# Patient Record
Sex: Male | Born: 1950
Health system: Southern US, Community
[De-identification: ages and names within clinical notes are randomized; demographics above are authoritative.]

## PROBLEM LIST (undated history)

## (undated) DIAGNOSIS — I712 Thoracic aortic aneurysm, without rupture, unspecified: Secondary | ICD-10-CM

## (undated) DIAGNOSIS — I739 Peripheral vascular disease, unspecified: Secondary | ICD-10-CM

## (undated) DIAGNOSIS — I639 Cerebral infarction, unspecified: Secondary | ICD-10-CM

## (undated) DIAGNOSIS — F329 Major depressive disorder, single episode, unspecified: Secondary | ICD-10-CM

## (undated) DIAGNOSIS — F4310 Post-traumatic stress disorder, unspecified: Secondary | ICD-10-CM

## (undated) DIAGNOSIS — I714 Abdominal aortic aneurysm, without rupture: Secondary | ICD-10-CM

## (undated) DIAGNOSIS — E039 Hypothyroidism, unspecified: Secondary | ICD-10-CM

## (undated) DIAGNOSIS — IMO0002 Reserved for concepts with insufficient information to code with codable children: Secondary | ICD-10-CM

## (undated) DIAGNOSIS — K219 Gastro-esophageal reflux disease without esophagitis: Secondary | ICD-10-CM

## (undated) DIAGNOSIS — E079 Disorder of thyroid, unspecified: Secondary | ICD-10-CM

## (undated) DIAGNOSIS — E785 Hyperlipidemia, unspecified: Secondary | ICD-10-CM

## (undated) DIAGNOSIS — J449 Chronic obstructive pulmonary disease, unspecified: Secondary | ICD-10-CM

## (undated) DIAGNOSIS — F32A Depression, unspecified: Secondary | ICD-10-CM

## (undated) DIAGNOSIS — E119 Type 2 diabetes mellitus without complications: Secondary | ICD-10-CM

## (undated) HISTORY — DX: Disorder of thyroid, unspecified: E07.9

## (undated) HISTORY — DX: Reserved for concepts with insufficient information to code with codable children: IMO0002

## (undated) HISTORY — PX: KNEE SURGERY: SHX244

## (undated) HISTORY — DX: Thoracic aortic aneurysm, without rupture, unspecified: I71.20

## (undated) HISTORY — DX: Peripheral vascular disease, unspecified: I73.9

## (undated) HISTORY — DX: Type 2 diabetes mellitus without complications: E11.9

## (undated) HISTORY — PX: CATARACT EXTRACTION: SUR2

## (undated) HISTORY — DX: Gastro-esophageal reflux disease without esophagitis: K21.9

## (undated) HISTORY — DX: Thoracic aortic aneurysm, without rupture: I71.2

## (undated) HISTORY — DX: Chronic obstructive pulmonary disease, unspecified: J44.9

## (undated) HISTORY — DX: Abdominal aortic aneurysm, without rupture: I71.4

## (undated) HISTORY — PX: CHOLECYSTECTOMY: SHX55

## (undated) HISTORY — PX: LUNG REMOVAL, PARTIAL: SHX233

---

## 2001-04-28 ENCOUNTER — Emergency Department (HOSPITAL_COMMUNITY): Admission: EM | Admit: 2001-04-28 | Discharge: 2001-04-29 | Payer: Self-pay | Admitting: Emergency Medicine

## 2001-04-29 ENCOUNTER — Encounter: Payer: Self-pay | Admitting: Emergency Medicine

## 2001-04-29 ENCOUNTER — Emergency Department (HOSPITAL_COMMUNITY): Admission: EM | Admit: 2001-04-29 | Discharge: 2001-04-29 | Payer: Self-pay | Admitting: Emergency Medicine

## 2003-02-05 ENCOUNTER — Encounter: Payer: Self-pay | Admitting: Emergency Medicine

## 2003-02-05 ENCOUNTER — Emergency Department (HOSPITAL_COMMUNITY): Admission: EM | Admit: 2003-02-05 | Discharge: 2003-02-05 | Payer: Self-pay | Admitting: Emergency Medicine

## 2003-02-21 ENCOUNTER — Emergency Department (HOSPITAL_COMMUNITY): Admission: EM | Admit: 2003-02-21 | Discharge: 2003-02-21 | Payer: Self-pay | Admitting: Emergency Medicine

## 2003-02-21 ENCOUNTER — Encounter: Payer: Self-pay | Admitting: Emergency Medicine

## 2013-05-18 ENCOUNTER — Encounter: Payer: Self-pay | Admitting: Family Medicine

## 2013-05-18 ENCOUNTER — Ambulatory Visit (INDEPENDENT_AMBULATORY_CARE_PROVIDER_SITE_OTHER): Payer: Medicare Other | Admitting: Family Medicine

## 2013-05-18 VITALS — BP 126/70 | HR 76 | Temp 97.3°F | Resp 20 | Wt 286.0 lb

## 2013-05-18 DIAGNOSIS — J441 Chronic obstructive pulmonary disease with (acute) exacerbation: Secondary | ICD-10-CM

## 2013-05-18 MED ORDER — PREDNISONE 20 MG PO TABS: ORAL_TABLET | ORAL | Status: DC

## 2013-05-18 MED ORDER — LEVOFLOXACIN 500 MG PO TABS: 500.0000 mg | ORAL_TABLET | Freq: Every day | ORAL | Status: DC

## 2013-05-18 NOTE — Progress Notes (Signed)
  Subjective:    Patient ID: Brad Taylor, male    DOB: 05-22-1951, 62 y.o.   MRN: [id number]  HPI This is a 62 year old male who gets the majority of his health care from the [place]. He has a past medical history of insulin-dependent diabetes mellitus and COPD. He continues to smoke. He presents with a one-week history of sinus pressure sinus pain, vertigo, worsening cough, wheezing, and progressive shortness of breath. He has a cough productive of sputum. Past Medical History  Diagnosis Date  . COPD (chronic obstructive pulmonary disease)   . Diabetes mellitus without complication   . Hypertension   . GERD (gastroesophageal reflux disease)   . Ulcer    No current outpatient prescriptions on file prior to visit.   No current facility-administered medications on file prior to visit.   Not on File History   Social History  . Marital Status: Married    Spouse Name: N/A    Number of Children: N/A  . Years of Education: N/A   Occupational History  . Not on file.   Social History Main Topics  . Smoking status: Former Smoker    Types: Cigarettes  . Smokeless tobacco: Former Neurosurgeon    Quit date: 05/04/2013  . Alcohol Use: No  . Drug Use: No  . Sexual Activity: Not on file   Other Topics Concern  . Not on file   Social History Narrative  . No narrative on file      Review of Systems  All other systems reviewed and are negative.       Objective:   Physical Exam  Vitals reviewed. HENT:  Right Ear: External ear normal.  Left Ear: External ear normal.  Nose: Nose normal.  Mouth/Throat: Oropharynx is clear and moist. No oropharyngeal exudate.  Neck: Neck supple.  Cardiovascular: Normal rate, regular rhythm and normal heart sounds.   No murmur heard. Pulmonary/Chest: Effort normal. No respiratory distress. He has wheezes. He has rhonchi.  Abdominal: Soft. Bowel sounds are normal.  Lymphadenopathy:    He has no cervical adenopathy.          Assessment & Plan:   1. COPD exacerbation Patient has an upper respiratory infection that is likely progressed to sinusitis and definitely has a COPD exacerbation. Begin Levaquin 500 mg by mouth daily for 7 days. Begin prednisone 20 mg tablets, 3 tablets on day 1 and day 2, 2 tablets on day 3 and 4, 1 tablet with a 5 and  6.  Use albuterol 2 puffs inhaled every 4 hours for wheezing and shortness of breath. Recheck next week or sooner if worse. I counseled patient about smoking cessation. I warned patient of prednisone likely exacerbate his blood sugar. He needs to increase his fluids and call me if his blood sugars rise above 300.  His last hemoglobin A1c was reportedly 7.5 at the [place] - predniSONE (DELTASONE) 20 MG tablet; 3 tabs poqday 1-2, 2 tabs poqday 3-4, 1 tab poqday 5-6  Dispense: 12 tablet; Refill: 0 - levofloxacin (LEVAQUIN) 500 MG tablet; Take 1 tablet (500 mg total) by mouth daily.  Dispense: 7 tablet; Refill: 0

## 2013-05-25 ENCOUNTER — Ambulatory Visit (INDEPENDENT_AMBULATORY_CARE_PROVIDER_SITE_OTHER): Payer: Medicare Other | Admitting: Family Medicine

## 2013-05-25 ENCOUNTER — Encounter: Payer: Self-pay | Admitting: Family Medicine

## 2013-05-25 VITALS — BP 130/84 | HR 64 | Temp 97.3°F | Resp 20 | Ht 70.75 in | Wt 280.0 lb

## 2013-05-25 DIAGNOSIS — J329 Chronic sinusitis, unspecified: Secondary | ICD-10-CM

## 2013-05-25 MED ORDER — AMOXICILLIN-POT CLAVULANATE 875-125 MG PO TABS: 1.0000 | ORAL_TABLET | Freq: Two times a day (BID) | ORAL | Status: DC

## 2013-05-25 NOTE — Progress Notes (Signed)
Subjective:    Patient ID: Brad Taylor, male    DOB: 04-26-1951, 62 y.o.   MRN: [id number]  HPI 05/18/13 This is a 62 year old male who gets the majority of his health care from the [place]. He has a past medical history of insulin-dependent diabetes mellitus and COPD. He continues to smoke. He presents with a one-week history of sinus pressure sinus pain, vertigo, worsening cough, wheezing, and progressive shortness of breath. He has a cough productive of sputum.  At that time, my plan was: 1. COPD exacerbation Patient has an upper respiratory infection that is likely progressed to sinusitis and definitely has a COPD exacerbation. Begin Levaquin 500 mg by mouth daily for 7 days. Begin prednisone 20 mg tablets, 3 tablets on day 1 and day 2, 2 tablets on day 3 and 4, 1 tablet with a 5 and  6.  Use albuterol 2 puffs inhaled every 4 hours for wheezing and shortness of breath. Recheck next week or sooner if worse. I counseled patient about smoking cessation. I warned patient of prednisone likely exacerbate his blood sugar. He needs to increase his fluids and call me if his blood sugars rise above 300.  His last hemoglobin A1c was reportedly 7.5 at the [place] - predniSONE (DELTASONE) 20 MG tablet; 3 tabs poqday 1-2, 2 tabs poqday 3-4, 1 tab poqday 5-6  Dispense: 12 tablet; Refill: 0 - levofloxacin (LEVAQUIN) 500 MG tablet; Take 1 tablet (500 mg total) by mouth daily.  Dispense: 7 tablet; Refill: 0  05/25/13 Patient's cough and breathing is much better.  He continues to smoke. However he also continues to have significant pain in his left frontal sinus and left maxillary sinus. He also reports disequilibrium and pain in both ears. He reports congestion, rhinorrhea, and postnasal drip. He has a dull constant headache. He also reports some photophobia which is unusual. He is also reported subjective fevers  Past Medical History  Diagnosis Date  . COPD (chronic obstructive pulmonary disease)   . Diabetes  mellitus without complication   . Hypertension   . GERD (gastroesophageal reflux disease)   . Ulcer    Current Outpatient Prescriptions on File Prior to Visit  Medication Sig Dispense Refill  . aspirin 81 MG tablet Take 81 mg by mouth daily.      . divalproex (DEPAKOTE) 500 MG DR tablet Take 500 mg by mouth 2 (two) times daily.      Marland Kitchen glipiZIDE (GLUCOTROL) 5 MG tablet Take 5 mg by mouth. 2 po qd      . insulin glargine (LANTUS) 100 UNIT/ML injection 60 units qam & 70 units qpm      . levofloxacin (LEVAQUIN) 500 MG tablet Take 1 tablet (500 mg total) by mouth daily.  7 tablet  0  . loratadine (CLARITIN) 10 MG tablet Take 10 mg by mouth daily.      . predniSONE (DELTASONE) 20 MG tablet 3 tabs poqday 1-2, 2 tabs poqday 3-4, 1 tab poqday 5-6  12 tablet  0  . RABEprazole (ACIPHEX) 20 MG tablet Take 20 mg by mouth daily.      Marland Kitchen terazosin (HYTRIN) 10 MG capsule Take 10 mg by mouth at bedtime.      . trazodone (DESYREL) 300 MG tablet Take 300 mg by mouth at bedtime.       No current facility-administered medications on file prior to visit.   Not on File History   Social History  . Marital Status: Married    Spouse Name:  N/A    Number of Children: N/A  . Years of Education: N/A   Occupational History  . Not on file.   Social History Main Topics  . Smoking status: Former Smoker    Types: Cigarettes  . Smokeless tobacco: Former Neurosurgeon    Quit date: 05/04/2013  . Alcohol Use: No  . Drug Use: No  . Sexual Activity: Not on file   Other Topics Concern  . Not on file   Social History Narrative  . No narrative on file      Review of Systems  All other systems reviewed and are negative.       Objective:   Physical Exam  Vitals reviewed. Constitutional: He is oriented to person, place, and time.  HENT:  Right Ear: External ear normal.  Left Ear: External ear normal.  Nose: Mucosal edema and rhinorrhea present. Right sinus exhibits no maxillary sinus tenderness and no frontal  sinus tenderness. Left sinus exhibits maxillary sinus tenderness and frontal sinus tenderness.  Mouth/Throat: Oropharynx is clear and moist. No oropharyngeal exudate.  Neck: Neck supple.  Cardiovascular: Normal rate, regular rhythm and normal heart sounds.   No murmur heard. Pulmonary/Chest: Effort normal. No respiratory distress. He has no wheezes. He has no rhonchi.  Abdominal: Soft. Bowel sounds are normal.  Lymphadenopathy:    He has no cervical adenopathy.  Neurological: He is alert and oriented to person, place, and time. He has normal reflexes. He displays normal reflexes. No cranial nerve deficit. He exhibits normal muscle tone. Coordination normal.          Assessment & Plan:  1. Rhinosinusitis Patient COPD exacerbation has improved dramatically. Unfortunately continues to have a lingering rhinosinusitis. Begin Augmentin 875 mg by mouth twice a day for 10 days. If symptoms are not better I would proceed with a CT scan of the sinuses. He is to call me immediately if symptoms change or worsen. I continue to recommend smoking cessation but the patient is now motivated to quit at the present time. - amoxicillin-clavulanate (AUGMENTIN) 875-125 MG per tablet; Take 1 tablet by mouth 2 (two) times daily.  Dispense: 20 tablet; Refill: 0

## 2013-06-04 ENCOUNTER — Telehealth: Payer: Self-pay | Admitting: Family Medicine

## 2013-06-04 NOTE — Telephone Encounter (Signed)
Please call in refill for Brad Taylor . Brad Taylor accidentally dropped his last bottle and he has enough for one last shot.   [facility]   [place]

## 2013-06-07 MED ORDER — INSULIN GLARGINE 100 UNIT/ML ~~LOC~~ SOLN: SUBCUTANEOUS | Status: DC

## 2013-06-07 NOTE — Telephone Encounter (Signed)
Medication refilled per protocol. 

## 2013-06-08 ENCOUNTER — Other Ambulatory Visit: Payer: Self-pay | Admitting: Family Medicine

## 2013-06-08 MED ORDER — INSULIN GLARGINE 100 UNIT/ML ~~LOC~~ SOLN: SUBCUTANEOUS | Status: DC

## 2013-06-08 NOTE — Telephone Encounter (Signed)
Pt called After hours on 11/1 out of insulin, medication refilled Will send in more vials as was recently here for OV

## 2013-09-15 ENCOUNTER — Inpatient Hospital Stay (HOSPITAL_COMMUNITY)
Admission: EM | Admit: 2013-09-15 | Discharge: 2013-09-20 | DRG: 065 | Disposition: A | Discharge status: Rehabilitation Facility | Payer: Medicare HMO | Attending: Family Medicine | Admitting: Family Medicine

## 2013-09-15 ENCOUNTER — Emergency Department (HOSPITAL_COMMUNITY): Payer: Medicare HMO

## 2013-09-15 ENCOUNTER — Encounter (HOSPITAL_COMMUNITY): Payer: Self-pay | Admitting: Radiology

## 2013-09-15 DIAGNOSIS — F4310 Post-traumatic stress disorder, unspecified: Secondary | ICD-10-CM | POA: Diagnosis present

## 2013-09-15 DIAGNOSIS — I4891 Unspecified atrial fibrillation: Secondary | ICD-10-CM | POA: Diagnosis present

## 2013-09-15 DIAGNOSIS — R29810 Facial weakness: Secondary | ICD-10-CM | POA: Diagnosis present

## 2013-09-15 DIAGNOSIS — Z9849 Cataract extraction status, unspecified eye: Secondary | ICD-10-CM | POA: Diagnosis present

## 2013-09-15 DIAGNOSIS — E1169 Type 2 diabetes mellitus with other specified complication: Secondary | ICD-10-CM | POA: Diagnosis present

## 2013-09-15 DIAGNOSIS — I440 Atrioventricular block, first degree: Secondary | ICD-10-CM | POA: Diagnosis present

## 2013-09-15 DIAGNOSIS — R471 Dysarthria and anarthria: Secondary | ICD-10-CM | POA: Diagnosis present

## 2013-09-15 DIAGNOSIS — F3289 Other specified depressive episodes: Secondary | ICD-10-CM | POA: Diagnosis present

## 2013-09-15 DIAGNOSIS — E039 Hypothyroidism, unspecified: Secondary | ICD-10-CM | POA: Diagnosis present

## 2013-09-15 DIAGNOSIS — Z794 Long term (current) use of insulin: Secondary | ICD-10-CM | POA: Diagnosis present

## 2013-09-15 DIAGNOSIS — J449 Chronic obstructive pulmonary disease, unspecified: Secondary | ICD-10-CM

## 2013-09-15 DIAGNOSIS — F17200 Nicotine dependence, unspecified, uncomplicated: Secondary | ICD-10-CM | POA: Diagnosis present

## 2013-09-15 DIAGNOSIS — E119 Type 2 diabetes mellitus without complications: Secondary | ICD-10-CM

## 2013-09-15 DIAGNOSIS — I10 Essential (primary) hypertension: Secondary | ICD-10-CM | POA: Diagnosis present

## 2013-09-15 DIAGNOSIS — R531 Weakness: Secondary | ICD-10-CM

## 2013-09-15 DIAGNOSIS — I6350 Cerebral infarction due to unspecified occlusion or stenosis of unspecified cerebral artery: Principal | ICD-10-CM | POA: Diagnosis present

## 2013-09-15 DIAGNOSIS — F329 Major depressive disorder, single episode, unspecified: Secondary | ICD-10-CM | POA: Diagnosis present

## 2013-09-15 DIAGNOSIS — E785 Hyperlipidemia, unspecified: Secondary | ICD-10-CM | POA: Diagnosis present

## 2013-09-15 DIAGNOSIS — Z79899 Other long term (current) drug therapy: Secondary | ICD-10-CM

## 2013-09-15 DIAGNOSIS — Z7982 Long term (current) use of aspirin: Secondary | ICD-10-CM

## 2013-09-15 DIAGNOSIS — I639 Cerebral infarction, unspecified: Secondary | ICD-10-CM | POA: Diagnosis present

## 2013-09-15 DIAGNOSIS — J4489 Other specified chronic obstructive pulmonary disease: Secondary | ICD-10-CM | POA: Diagnosis present

## 2013-09-15 DIAGNOSIS — K219 Gastro-esophageal reflux disease without esophagitis: Secondary | ICD-10-CM | POA: Diagnosis present

## 2013-09-15 DIAGNOSIS — G8190 Hemiplegia, unspecified affecting unspecified side: Secondary | ICD-10-CM | POA: Diagnosis present

## 2013-09-15 DIAGNOSIS — L97909 Non-pressure chronic ulcer of unspecified part of unspecified lower leg with unspecified severity: Secondary | ICD-10-CM | POA: Diagnosis present

## 2013-09-15 HISTORY — DX: Post-traumatic stress disorder, unspecified: F43.10

## 2013-09-15 HISTORY — DX: Hypothyroidism, unspecified: E03.9

## 2013-09-15 HISTORY — DX: Depression, unspecified: F32.A

## 2013-09-15 HISTORY — DX: Hyperlipidemia, unspecified: E78.5

## 2013-09-15 HISTORY — DX: Major depressive disorder, single episode, unspecified: F32.9

## 2013-09-15 LAB — DIFFERENTIAL
BASOS PCT: 0 % (ref 0–1)
Basophils Absolute: 0 10*3/uL (ref 0.0–0.1)
EOS ABS: 0.6 10*3/uL (ref 0.0–0.7)
Eosinophils Relative: 6 % — ABNORMAL HIGH (ref 0–5)
Lymphocytes Relative: 38 % (ref 12–46)
Lymphs Abs: 3.9 10*3/uL (ref 0.7–4.0)
MONOS PCT: 9 % (ref 3–12)
Monocytes Absolute: 1 10*3/uL (ref 0.1–1.0)
NEUTROS PCT: 47 % (ref 43–77)
Neutro Abs: 4.8 10*3/uL (ref 1.7–7.7)

## 2013-09-15 LAB — COMPREHENSIVE METABOLIC PANEL
ALBUMIN: 3.6 g/dL (ref 3.5–5.2)
ALT: 12 U/L (ref 0–53)
AST: 13 U/L (ref 0–37)
Alkaline Phosphatase: 64 U/L (ref 39–117)
BUN: 10 mg/dL (ref 6–23)
CO2: 30 mEq/L (ref 19–32)
Calcium: 9.2 mg/dL (ref 8.4–10.5)
Chloride: 95 mEq/L — ABNORMAL LOW (ref 96–112)
Creatinine, Ser: 0.83 mg/dL (ref 0.50–1.35)
GFR calc Af Amer: >90 mL/min (ref >90)
GFR calc non Af Amer: >90 mL/min (ref >90)
Glucose, Bld: 125 mg/dL — ABNORMAL HIGH (ref 70–99)
Potassium: 5 mEq/L (ref 3.7–5.3)
SODIUM: 137 meq/L (ref 137–147)
TOTAL PROTEIN: 7 g/dL (ref 6.0–8.3)
Total Bilirubin: 0.3 mg/dL (ref 0.3–1.2)

## 2013-09-15 LAB — POCT I-STAT, CHEM 8
BUN: 9 mg/dL (ref 6–23)
Calcium, Ion: 1.17 mmol/L (ref 1.13–1.30)
Chloride: 95 mEq/L — ABNORMAL LOW (ref 96–112)
Creatinine, Ser: 1 mg/dL (ref 0.50–1.35)
GLUCOSE: 124 mg/dL — AB (ref 70–99)
HCT: 50 % (ref 39.0–52.0)
Hemoglobin: 17 g/dL (ref 13.0–17.0)
Potassium: 4.6 mEq/L (ref 3.7–5.3)
Sodium: 135 mEq/L — ABNORMAL LOW (ref 137–147)
TCO2: 29 mmol/L (ref 0–100)

## 2013-09-15 LAB — ETHANOL

## 2013-09-15 LAB — APTT: aPTT: 26 seconds (ref 24–37)

## 2013-09-15 LAB — GLUCOSE, CAPILLARY
Glucose-Capillary: 132 mg/dL — ABNORMAL HIGH (ref 70–99)
Glucose-Capillary: 90 mg/dL (ref 70–99)

## 2013-09-15 LAB — CBC
HCT: 45.3 % (ref 39.0–52.0)
Hemoglobin: 15.9 g/dL (ref 13.0–17.0)
MCH: 31.7 pg (ref 26.0–34.0)
MCHC: 35.1 g/dL (ref 30.0–36.0)
MCV: 90.2 fL (ref 78.0–100.0)
PLATELETS: 175 10*3/uL (ref 150–400)
RBC: 5.02 MIL/uL (ref 4.22–5.81)
RDW: 12.9 % (ref 11.5–15.5)
WBC: 10.2 10*3/uL (ref 4.0–10.5)

## 2013-09-15 LAB — POCT I-STAT TROPONIN I: Troponin i, poc: 0 ng/mL (ref 0.00–0.08)

## 2013-09-15 LAB — TROPONIN I

## 2013-09-15 LAB — PROTIME-INR
INR: 1.03 (ref 0.00–1.49)
PROTHROMBIN TIME: 13.3 s (ref 11.6–15.2)

## 2013-09-15 MED ORDER — SODIUM CHLORIDE 0.9 % IJ SOLN
3.0000 mL | Freq: Two times a day (BID) | INTRAMUSCULAR | Status: DC
  Administered 2013-09-16 – 2013-09-17 (×5): 3 mL via INTRAVENOUS

## 2013-09-15 MED ORDER — PANTOPRAZOLE SODIUM 40 MG PO TBEC
40.0000 mg | DELAYED_RELEASE_TABLET | Freq: Every day | ORAL | Status: DC
  Administered 2013-09-16 – 2013-09-20 (×5): 40 mg via ORAL
  Filled 2013-09-15 (×5): qty 1

## 2013-09-15 MED ORDER — ASPIRIN 325 MG PO TABS
325.0000 mg | ORAL_TABLET | Freq: Every day | ORAL | Status: DC
  Administered 2013-09-16 (×2): 325 mg via ORAL
  Filled 2013-09-15 (×2): qty 1

## 2013-09-15 MED ORDER — LEVOTHYROXINE SODIUM 125 MCG PO TABS
125.0000 ug | ORAL_TABLET | Freq: Every day | ORAL | Status: DC
  Administered 2013-09-16 – 2013-09-20 (×5): 125 ug via ORAL
  Filled 2013-09-15 (×6): qty 1

## 2013-09-15 MED ORDER — ATORVASTATIN CALCIUM 40 MG PO TABS
40.0000 mg | ORAL_TABLET | Freq: Every day | ORAL | Status: DC
  Administered 2013-09-16: 40 mg via ORAL
  Filled 2013-09-15 (×2): qty 1

## 2013-09-15 MED ORDER — BUSPIRONE HCL 5 MG PO TABS
5.0000 mg | ORAL_TABLET | Freq: Two times a day (BID) | ORAL | Status: DC | PRN
  Filled 2013-09-15: qty 1

## 2013-09-15 MED ORDER — ENOXAPARIN SODIUM 40 MG/0.4ML ~~LOC~~ SOLN
40.0000 mg | SUBCUTANEOUS | Status: DC
  Administered 2013-09-16 – 2013-09-19 (×5): 40 mg via SUBCUTANEOUS
  Filled 2013-09-15 (×6): qty 0.4

## 2013-09-15 MED ORDER — SODIUM CHLORIDE 0.9 % IJ SOLN
3.0000 mL | INTRAMUSCULAR | Status: DC | PRN
Start: 2013-09-15 — End: 2013-09-20

## 2013-09-15 MED ORDER — VENLAFAXINE HCL ER 150 MG PO CP24
150.0000 mg | ORAL_CAPSULE | Freq: Two times a day (BID) | ORAL | Status: DC
  Administered 2013-09-16 – 2013-09-20 (×10): 150 mg via ORAL
  Filled 2013-09-15 (×11): qty 1

## 2013-09-15 MED ORDER — DIVALPROEX SODIUM 500 MG PO DR TAB
1000.0000 mg | DELAYED_RELEASE_TABLET | Freq: Every day | ORAL | Status: DC
  Administered 2013-09-16 – 2013-09-19 (×5): 1000 mg via ORAL
  Filled 2013-09-15 (×6): qty 2

## 2013-09-15 MED ORDER — STROKE: EARLY STAGES OF RECOVERY BOOK
Freq: Once | Status: AC
  Administered 2013-09-16
  Filled 2013-09-15: qty 1

## 2013-09-15 MED ORDER — SODIUM CHLORIDE 0.9 % IV SOLN: 250.0000 mL | INTRAVENOUS | Status: DC | PRN

## 2013-09-15 MED ORDER — INSULIN ASPART 100 UNIT/ML ~~LOC~~ SOLN
0.0000 [IU] | Freq: Three times a day (TID) | SUBCUTANEOUS | Status: DC
  Administered 2013-09-16 (×2): 2 [IU] via SUBCUTANEOUS
  Administered 2013-09-17: 3 [IU] via SUBCUTANEOUS
  Administered 2013-09-17: 5 [IU] via SUBCUTANEOUS
  Administered 2013-09-18 (×2): 3 [IU] via SUBCUTANEOUS
  Administered 2013-09-18: 5 [IU] via SUBCUTANEOUS
  Administered 2013-09-19: 3 [IU] via SUBCUTANEOUS
  Administered 2013-09-19 – 2013-09-20 (×3): 5 [IU] via SUBCUTANEOUS
  Administered 2013-09-20: 3 [IU] via SUBCUTANEOUS

## 2013-09-15 MED ORDER — TRAZODONE HCL 150 MG PO TABS
300.0000 mg | ORAL_TABLET | Freq: Every day | ORAL | Status: DC
  Administered 2013-09-16 – 2013-09-19 (×4): 300 mg via ORAL
  Filled 2013-09-15 (×6): qty 2

## 2013-09-15 MED ORDER — BUPROPION HCL ER (SR) 150 MG PO TB12
150.0000 mg | ORAL_TABLET | Freq: Every day | ORAL | Status: DC
  Administered 2013-09-16 – 2013-09-20 (×5): 150 mg via ORAL
  Filled 2013-09-15 (×5): qty 1

## 2013-09-15 NOTE — ED Notes (Signed)
Per EMS: Pt reports right sided weakness and slurred speech since 1530 this afternoon. Pt reports generalized "not feeling well" all day but denies N/V/pain. Pt AO x4. CBG 116. 144/81. 65 SR, noted to be irregular.

## 2013-09-15 NOTE — ED Notes (Signed)
Pt noted to be in a-fib. Dr. Lacinda Axon to order new EKG.

## 2013-09-15 NOTE — Consult Note (Signed)
Referring Physician: Rogene Houston    Chief Complaint: Right sided weakness, slurred speech  HPI: Brad Taylor is an 63 y.o. male who awakened this morning and was not feeling well.  His blood sugar was elevated at 180 and he decided to go back to sleep.  When he awakened later he noted that he was stumbling as he tried to get to the bathroom so after using the bathroom he went back to bed.  When he got out of bed later he was having some slurred speech and was complaining that his right arm did not feel right.  He though that he slept on it wrong.  When his son came home at 5:30 he noted that his face was drooping, speech was very slurred and he could not use his right arm well.  EMS was called at that time and the patient was brought in as a code stroke.    Date last known well: Date: 09/15/2013 Time last known well: Time: 14:00 tPA Given: No: Outside time window  Past Medical History  Diagnosis Date  . COPD (chronic obstructive pulmonary disease)   . Diabetes mellitus without complication   . Hypertension   . GERD (gastroesophageal reflux disease)   . Ulcer     History reviewed. No pertinent past surgical history.  Family history; Mother still alive.  Ha a history of colon cancer and is now cancer free.  Father died of COPD  Social History:  Patient smokes cigarettes.  Reports no history of alcohol or illicit drug abuse.    Allergies: Not on File  Medications: I have reviewed the patient's current medications. Prior to Admission:  No current facility-administered medications for this encounter. Current outpatient prescriptions: aspirin 81 MG tablet, Take 81 mg by mouth daily., Disp: , Rfl: ;   buPROPion (WELLBUTRIN SR) 150 MG 12 hr tablet, Take 150 mg by mouth daily., Disp: , Rfl: ;   busPIRone (BUSPAR) 10 MG tablet, Take 5 mg by mouth 2 (two) times daily as needed (for anxiety)., Disp: , Rfl: ;   divalproex (DEPAKOTE) 500 MG DR tablet, Take 1,000 mg by mouth at bedtime. , Disp: ,  Rfl:  etodolac (LODINE) 400 MG tablet, Take 400 mg by mouth 2 (two) times daily., Disp: , Rfl: ;   glipiZIDE (GLUCOTROL) 5 MG tablet, Take 10 mg by mouth daily before breakfast. , Disp: , Rfl: ;   insulin glargine (LANTUS) 100 UNIT/ML injection, Inject 60-65 Units into the skin 2 (two) times daily. 60 units in am, 65 units in pm, Disp: , Rfl:  levothyroxine (SYNTHROID, LEVOTHROID) 125 MCG tablet, Take 125 mcg by mouth daily before breakfast., Disp: , Rfl: ;   RABEprazole (ACIPHEX) 20 MG tablet, Take 40 mg by mouth daily before breakfast. , Disp: , Rfl: ;   rosuvastatin (CRESTOR) 20 MG tablet, Take 10 mg by mouth at bedtime., Disp: , Rfl: ;   Silver Nitrate SOLN, 1 application by Does not apply route every other day., Disp: , Rfl:  terazosin (HYTRIN) 10 MG capsule, Take 10 mg by mouth at bedtime., Disp: , Rfl: ;   traZODone (DESYREL) 100 MG tablet, Take 300 mg by mouth at bedtime. , Disp: , Rfl: ;   venlafaxine XR (EFFEXOR-XR) 150 MG 24 hr capsule, Take 150 mg by mouth 2 (two) times daily., Disp: , Rfl:   ROS: History obtained from the patient  General ROS: negative for - chills, fatigue, fever, night sweats, weight gain or weight loss Psychological ROS: negative  for - behavioral disorder, hallucinations, memory difficulties, mood swings or suicidal ideation Ophthalmic ROS: negative for - blurry vision, double vision, eye pain or loss of vision ENT ROS: negative for - epistaxis, nasal discharge, oral lesions, sore throat, tinnitus or vertigo Allergy and Immunology ROS: negative for - hives or itchy/watery eyes Hematological and Lymphatic ROS: negative for - bleeding problems, bruising or swollen lymph nodes Endocrine ROS: negative for - galactorrhea, hair pattern changes, polydipsia/polyuria or temperature intolerance Respiratory ROS: negative for - cough, hemoptysis, shortness of breath or wheezing Cardiovascular ROS: negative for - chest pain, dyspnea on exertion, edema or irregular  heartbeat Gastrointestinal ROS: negative for - abdominal pain, diarrhea, hematemesis, nausea/vomiting or stool incontinence Genito-Urinary ROS: negative for - dysuria, hematuria, incontinence or urinary frequency/urgency Musculoskeletal ROS: negative for - joint swelling or muscular weakness Neurological ROS: as noted in HPI Dermatological ROS: negative for rash and skin lesion changes  Physical Examination: Blood pressure 127/56, pulse 54, resp. rate 19, SpO2 95.00%.  Neurologic Examination: Mental Status: Alert, oriented, thought content appropriate.  Speech nonfluent.  At times words can not be distinguished.  Speech is slurred.  Able to follow 3 step commands without difficulty. Cranial Nerves: II: Discs flat bilaterally; Visual fields grossly normal, pupils equal, round, reactive to light and accommodation III,IV, VI: ptosis not present, extra-ocular motions intact bilaterally V,VII: mild right facial droop, facial light touch sensation normal bilaterally VIII: hearing normal bilaterally IX,X: gag reflex present XI: bilateral shoulder shrug XII: midline tongue extension Motor: Right : Upper extremity   3/5    Left:     Upper extremity   5/5  Lower extremity   5/5     Lower extremity   5/5 Tone and bulk:normal tone throughout; no atrophy noted Sensory: Pinprick and light touch decreased on the RUE Deep Tendon Reflexes: 2+ and symmetric with absent AJ's bilaterally Plantars: Right: downgoing   Left: downgoing Cerebellar: normal finger-to-nose and normal heel-to-shin test.  Difficult to perform on the right secondary to weakness Gait: Unable to test safely CV: pulses palpable throughout   Laboratory Studies:  Basic Metabolic Panel:  Recent Labs Lab 09/15/13 1833 09/15/13 1845  NA 137 135*  K 5.0 4.6  CL 95* 95*  CO2 30  --   GLUCOSE 125* 124*  BUN 10 9  CREATININE 0.83 1.00  CALCIUM 9.2  --     Liver Function Tests:  Recent Labs Lab 09/15/13 1833  AST 13   ALT 12  ALKPHOS 64  BILITOT 0.3  PROT 7.0  ALBUMIN 3.6   No results found for this basename: LIPASE, AMYLASE,  in the last 168 hours No results found for this basename: AMMONIA,  in the last 168 hours  CBC:  Recent Labs Lab 09/15/13 1833 09/15/13 1845  WBC 10.2  --   NEUTROABS 4.8  --   HGB 15.9 17.0  HCT 45.3 50.0  MCV 90.2  --   PLT 175  --     Cardiac Enzymes:  Recent Labs Lab 09/15/13 1833  TROPONINI <0.30    BNP: No components found with this basename: POCBNP,   CBG:  Recent Labs Lab 09/15/13 1856  GLUCAP 132*    Microbiology: No results found for this or any previous visit.  Coagulation Studies:  Recent Labs  09/15/13 1833  LABPROT 13.3  INR 1.03    Urinalysis: No results found for this basename: COLORURINE, APPERANCEUR, LABSPEC, PHURINE, GLUCOSEU, HGBUR, BILIRUBINUR, KETONESUR, PROTEINUR, UROBILINOGEN, NITRITE, LEUKOCYTESUR,  in the last 168 hours  Lipid Panel: No results found for this basename: chol, trig, hdl, cholhdl, vldl, ldlcalc    HgbA1C:  No results found for this basename: HGBA1C    Urine Drug Screen:   No results found for this basename: labopia, cocainscrnur, labbenz, amphetmu, thcu, labbarb    Alcohol Level:  Recent Labs Lab 09/15/13 [place] <11    Other results: EKG: sinus rhythm at 55 bpm.  Imaging: Ct Head Wo Contrast  09/15/2013   CLINICAL DATA:  Right-sided weakness and slurred speech  EXAM: CT HEAD WITHOUT CONTRAST  TECHNIQUE: Contiguous axial images were obtained from the base of the skull through the vertex without intravenous contrast.  COMPARISON:  None.  FINDINGS: There is chronic diffuse atrophy. Chronic bilateral periventricular white matter small vessel ischemic changes noted. There is no midline shift, hydrocephalus, or mass. No acute hemorrhage or acute transcortical infarct is identified. The bony calvarium is intact. There is mucoperiosteal thickening of bilateral ethmoid and bilateral maxillary  sinuses. Air-fluid level is identified in the left maxillary sinus consistent with acute sinusitis.  IMPRESSION: No focal acute intracranial abnormality identified. Chronic diffuse atrophy. Chronic bilateral periventricular white matter small vessel ischemic change.  I attempted to call Dr. Alexis Goodell with Critical Value/emergent results at the time of interpretation on 09/15/2013 at 6:51 PM but could not reach her.   Electronically Signed   By: Abelardo Diesel M.D.   On: 09/15/2013 18:57    Assessment: 63 y.o. male presenting with difficulty with speech, right sided weakness and numbness.  Patient is outside of the window for tPA.  Has multiple stroke risk factors.  CT reviewed and shows no acute changes.  Further work up recommended.    Stroke Risk Factors - diabetes mellitus, hypertension and smoking  Plan: 1. HgbA1c, fasting lipid panel 2. MRI, MRA  of the brain without contrast 3. PT consult, OT consult, Speech consult 4. Echocardiogram 5. Carotid dopplers 6. Prophylactic therapy-Antiplatelet med: Aspirin - dose 325mg  daily 7. Risk factor modification 8. Telemetry monitoring 9. Frequent neuro checks  Case discussed with Dr. Forbes Cellar, MD [facility] [phone] 09/15/2013, 7:33 PM

## 2013-09-15 NOTE — ED Notes (Signed)
Dr Cook at bedside

## 2013-09-15 NOTE — H&P (Signed)
[facility] Admission History and Physical Service Pager: [phone]  Patient name: Brad Taylor Medical record number: [id number] Date of birth: 11/19/1950 Age: 63 y.o. Gender: male  Primary Care Provider: Odette Fraction, MD Consultants: Neurology Code Status: Full code  Chief Complaint:   Assessment and Plan: Brad Taylor is a 63 y.o. male presenting with right facial droop, slurred speech, and right upper extremity weakness concerning for CVA. PMH is significant for COPD, DM-2, Hypothyroidism, GERD, Depression, PTSD, and HLD.   Acute Stroke - CT head negative. - Admit to telemetry - Stroke work up: MRI/MRA, echo, carotid dopplers - Risk stratification labs: Hb A1C, Lipid panel - Frequent neuro checks - Antiplatelet therapy - ASA 325 per neurology; Consider plavix as patient was on aspirin as outpatient - PT/OT/SLP consults; patient passed bedside swallow in ED.  DM-2  - Hb A1C ordered - CBG's TIDAC and QHS - SSI Moderate - Will assess Lantus dose after nighttime CBG.  CBG's have been well controlled here (120's-130's)  Irregular HR - In the ED, patient appeared to be in atrial fibrillation on the cardiac monitor - EKG obtained and revealed sinus arrythmia with 1st degree AV block (per my reading). - Will repeat EKG in the am  Elevated BP - Patient does not carry a diagnosis of hypertension - Allowing permissive HTN in the setting of stroke - Will monitor closley  Hypothyroidism - Continuing home Synthroid  GERD - Continuing PPI (using formulary protonix)  Psych: Depression/PTSD - Continuing home medications as patient has been on them for years: Trazodone, Divalproex, BuSpar, Bupropion, Venlafaxine.  HLD - Continuing statin (formulary Lipitor)  COPD - Patient is only on albuterol at home which his wife states she uses very infrequently - No evidence of acute exacerbation currently - Monitor closely during  admission  FEN/GI: SL IV, Carb modified diet Prophylaxis: Lovenox  Disposition: Pending further workup and PT/OT evaluations/recommendations   History of Present Illness:  Brad Taylor is a 63 y.o. male with PMH of COPD, DM-2, Hypothyroidism, GERD, Depression, PTSD, and HLD who presents with right facial droop and right-sided weakness.  History obtained from patient and his wife.  Brad Taylor reports he has not felt well today.  He is been more tired/fatigued today and has been sleeping the majority of the day.  His wife reports that  she left for a period of time today and when she returned home this afternoon, Brad Taylor reported having difficulty getting his clothes on.  At approximately 5 PM, his wife noted that he had difficulty with speech and some right-sided weakness.  She also noted right facial droop.  EMS was then called and patient was taken immediately to the emergency room for evaluation.  In the emergency department, code stroke was called.  CT scan was obtained and was negative.  Neurology was consulted and patient was deemed to be outside of tPA window (last known well @ 14:00). Family medicine then called for admission.  Review Of Systems: Per HPI with the following additions:   He denies chest pain, shortness of breath, nausea, vomiting, diarrhea, fevers, chills.  He reports that prior to today he has been feeling well. Otherwise 12 point review of systems was performed and was unremarkable.  Patient Active Problem List   Diagnosis Date Noted  . Stroke 09/15/2013   Past Medical History: Past Medical History  Diagnosis Date  . COPD (chronic obstructive pulmonary disease)   . Diabetes mellitus without complication   .  Hypothyroidism   . GERD (gastroesophageal reflux disease)   . Ulcer   . Post traumatic stress disorder   . Depression   . Hyperlipidemia    Past Surgical History: Past Surgical History  Procedure Laterality Date  . Cholecystectomy    . Cataract  extraction    . Knee surgery      Social History: History  Substance Use Topics  . Smoking status: Current Every Day Smoker -- 1.00 packs/day    Types: Cigarettes  . Smokeless tobacco: Former Systems developer    Quit date: 05/04/2013  . Alcohol Use: No     Comment: Former heavy drinker, been sober for 10 years    Family History: History reviewed. No pertinent family history. Allergies and Medications: Not on File No current facility-administered medications on file prior to encounter.   Current Outpatient Prescriptions on File Prior to Encounter  Medication Sig Dispense Refill  . aspirin 81 MG tablet Take 81 mg by mouth daily.      . divalproex (DEPAKOTE) 500 MG DR tablet Take 1,000 mg by mouth at bedtime.       Marland Kitchen glipiZIDE (GLUCOTROL) 5 MG tablet Take 10 mg by mouth daily before breakfast.       . RABEprazole (ACIPHEX) 20 MG tablet Take 40 mg by mouth daily before breakfast.       . terazosin (HYTRIN) 10 MG capsule Take 10 mg by mouth at bedtime.        Objective: BP 151/84  Pulse 56  Temp(Src) 97.8 F (36.6 C)  Resp 14  SpO2 97% Exam: General: obese male, resting in bed, NAD.  HEENT: MMM. PERRLA.  Cardiovascular: Irregular.  2/6 systolic murmur noted.   Respiratory: Scattered mild wheezing noted. Abdomen: obese, protuberant, soft, nontender, nondistended. Surgical scar noted from prior cholecystectomy. Extremities: Trace LE edema with hyperpigmentation consistent with chronic venous insufficiency.   Skin: Right ankle - small healing ulceration (approximately 0.5 cm x 0.5 cm).  Left ankle - 1 cm x 1 cm ulceration noted. Neuro: AO x 3.  Slurred speech. PEERLA. CN 2-12 intact. Very mild right facial droop. Muscle strength - normal except 4/5 in RUE. Patient had difficulty with finger to nose on the right.  Labs and Imaging: CBC BMET   Recent Labs Lab 09/15/13 1833 09/15/13 1845  WBC 10.2  --   HGB 15.9 17.0  HCT 45.3 50.0  PLT 175  --     Recent Labs Lab 09/15/13 1833  09/15/13 1845  NA 137 135*  K 5.0 4.6  CL 95* 95*  CO2 30  --   BUN 10 9  CREATININE 0.83 1.00  GLUCOSE 125* 124*  CALCIUM 9.2  --      Ct Head Wo Contrast 09/15/2013   IMPRESSION: No focal acute intracranial abnormality identified. Chronic diffuse atrophy. Chronic bilateral periventricular white matter small vessel ischemic change.  Dg Chest Port 1 View 09/15/2013   IMPRESSION: Negative except for mild cardiac enlargement   Coral Spikes, DO 09/15/2013, 9:05 PM PGY-2, [facility] pager: [phone], text pages welcome

## 2013-09-15 NOTE — ED Provider Notes (Signed)
CSN: [id number]     Arrival date & time 09/15/13  1827 History   First MD Initiated Contact with Patient 09/15/13 1832     Chief Complaint  Patient presents with  . Code Stroke    @EDPCLEARED @ (Consider location/radiation/quality/duration/timing/severity/associated sxs/prior Treatment) The history is provided by the patient and the EMS personnel.   63 year old male who history of diabetes and hypertension as risk factors for stroke. Patient also has COPD. Patient with acute onset at around 5:30 of right-sided weakness speech being off and a droop to the right side of the face. Code stroke was called in the field.  Past Medical History  Diagnosis Date  . COPD (chronic obstructive pulmonary disease)   . Diabetes mellitus without complication   . Hypertension   . GERD (gastroesophageal reflux disease)   . Ulcer    History reviewed. No pertinent past surgical history. History reviewed. No pertinent family history. History  Substance Use Topics  . Smoking status: Former Smoker    Types: Cigarettes  . Smokeless tobacco: Former Systems developer    Quit date: 05/04/2013  . Alcohol Use: No    Review of Systems  Constitutional: Positive for fatigue.  HENT: Negative for congestion.   Respiratory: Negative for shortness of breath.   Cardiovascular: Negative for chest pain.  Gastrointestinal: Negative for abdominal pain.  Genitourinary: Negative for dysuria.  Musculoskeletal: Negative for back pain.  Skin: Negative for rash.  Neurological: Positive for facial asymmetry, speech difficulty and weakness.  Hematological: Does not bruise/bleed easily.  Psychiatric/Behavioral: Negative for confusion.      Allergies  Review of patient's allergies indicates not on file.  Home Medications   Current Outpatient Rx  Name  Route  Sig  Dispense  Refill  . amoxicillin-clavulanate (AUGMENTIN) 875-125 MG per tablet   Oral   Take 1 tablet by mouth 2 (two) times daily.   20 tablet   0   . aspirin 81  MG tablet   Oral   Take 81 mg by mouth daily.         . divalproex (DEPAKOTE) 500 MG DR tablet   Oral   Take 500 mg by mouth 2 (two) times daily.         Marland Kitchen glipiZIDE (GLUCOTROL) 5 MG tablet   Oral   Take 5 mg by mouth. 2 po qd         . insulin glargine (LANTUS) 100 UNIT/ML injection      60 units qam & 70 units qpm   50 mL   2     More vials needed   . levofloxacin (LEVAQUIN) 500 MG tablet   Oral   Take 1 tablet (500 mg total) by mouth daily.   7 tablet   0   . loratadine (CLARITIN) 10 MG tablet   Oral   Take 10 mg by mouth daily.         . predniSONE (DELTASONE) 20 MG tablet      3 tabs poqday 1-2, 2 tabs poqday 3-4, 1 tab poqday 5-6   12 tablet   0   . RABEprazole (ACIPHEX) 20 MG tablet   Oral   Take 20 mg by mouth daily.         Marland Kitchen terazosin (HYTRIN) 10 MG capsule   Oral   Take 10 mg by mouth at bedtime.         . trazodone (DESYREL) 300 MG tablet   Oral   Take 300 mg by mouth at bedtime.  BP 147/78  Pulse 57  Resp 19  SpO2 95% Physical Exam  Nursing note and vitals reviewed. Constitutional: He is oriented to person, place, and time. He appears well-developed and well-nourished. He appears distressed.  HENT:  Head: Normocephalic and atraumatic.  Mouth/Throat: Oropharynx is clear and moist.  Slight right facial droop.  Eyes: Conjunctivae and EOM are normal. Pupils are equal, round, and reactive to light.  Neck: Normal range of motion. Neck supple.  Cardiovascular: Normal rate, regular rhythm and normal heart sounds.   No murmur heard. Pulmonary/Chest: Effort normal and breath sounds normal. No respiratory distress.  Bilateral rhonchi  Abdominal: Soft. Bowel sounds are normal.  Musculoskeletal: Normal range of motion.  Neurological: He is alert and oriented to person, place, and time.  Right-sided facial droop very mild. Finger to nose on right side with lots of difficulty heel to knee on right side with lots of difficulty.  Mild weakness to right upper extremity and right lower extremity.  Skin: Skin is warm. No rash noted.    ED Course  Procedures (including critical care time) Labs Review Labs Reviewed  DIFFERENTIAL - Abnormal; Notable for the following:    Eosinophils Relative 6 (*)    All other components within normal limits  GLUCOSE, CAPILLARY - Abnormal; Notable for the following:    Glucose-Capillary 132 (*)    All other components within normal limits  POCT I-STAT, CHEM 8 - Abnormal; Notable for the following:    Sodium 135 (*)    Chloride 95 (*)    Glucose, Bld 124 (*)    All other components within normal limits  CBC  ETHANOL  PROTIME-INR  APTT  COMPREHENSIVE METABOLIC PANEL  TROPONIN I  URINE RAPID DRUG SCREEN (HOSP PERFORMED)  URINALYSIS, ROUTINE W REFLEX MICROSCOPIC  POCT I-STAT TROPONIN I   Results for orders placed during the hospital encounter of 09/15/13  ETHANOL      Result Value Ref Range   Alcohol, Ethyl (B) <11  0 - 11 mg/dL  PROTIME-INR      Result Value Ref Range   Prothrombin Time 13.3  11.6 - 15.2 seconds   INR 1.03  0.00 - 1.49  APTT      Result Value Ref Range   aPTT 26  24 - 37 seconds  CBC      Result Value Ref Range   WBC 10.2  4.0 - 10.5 K/uL   RBC 5.02  4.22 - 5.81 MIL/uL   Hemoglobin 15.9  13.0 - 17.0 g/dL   HCT 45.3  39.0 - 52.0 %   MCV 90.2  78.0 - 100.0 fL   MCH 31.7  26.0 - 34.0 pg   MCHC 35.1  30.0 - 36.0 g/dL   RDW 12.9  11.5 - 15.5 %   Platelets 175  150 - 400 K/uL  DIFFERENTIAL      Result Value Ref Range   Neutrophils Relative % 47  43 - 77 %   Neutro Abs 4.8  1.7 - 7.7 K/uL   Lymphocytes Relative 38  12 - 46 %   Lymphs Abs 3.9  0.7 - 4.0 K/uL   Monocytes Relative 9  3 - 12 %   Monocytes Absolute 1.0  0.1 - 1.0 K/uL   Eosinophils Relative 6 (*) 0 - 5 %   Eosinophils Absolute 0.6  0.0 - 0.7 K/uL   Basophils Relative 0  0 - 1 %   Basophils Absolute 0.0  0.0 - 0.1 K/uL  COMPREHENSIVE  METABOLIC PANEL      Result Value Ref Range    Sodium 137  137 - 147 mEq/L   Potassium 5.0  3.7 - 5.3 mEq/L   Chloride 95 (*) 96 - 112 mEq/L   CO2 30  19 - 32 mEq/L   Glucose, Bld 125 (*) 70 - 99 mg/dL   BUN 10  6 - 23 mg/dL   Creatinine, Ser 0.83  0.50 - 1.35 mg/dL   Calcium 9.2  8.4 - 10.5 mg/dL   Total Protein 7.0  6.0 - 8.3 g/dL   Albumin 3.6  3.5 - 5.2 g/dL   AST 13  0 - 37 U/L   ALT 12  0 - 53 U/L   Alkaline Phosphatase 64  39 - 117 U/L   Total Bilirubin 0.3  0.3 - 1.2 mg/dL   GFR calc non Af Amer >90  >90 mL/min   GFR calc Af Amer >90  >90 mL/min  TROPONIN I      Result Value Ref Range   Troponin I <0.30  <0.30 ng/mL  GLUCOSE, CAPILLARY      Result Value Ref Range   Glucose-Capillary 132 (*) 70 - 99 mg/dL   Comment 1 Notify RN     Comment 2 Documented in Chart    POCT I-STAT, CHEM 8      Result Value Ref Range   Sodium 135 (*) 137 - 147 mEq/L   Potassium 4.6  3.7 - 5.3 mEq/L   Chloride 95 (*) 96 - 112 mEq/L   BUN 9  6 - 23 mg/dL   Creatinine, Ser 1.00  0.50 - 1.35 mg/dL   Glucose, Bld 124 (*) 70 - 99 mg/dL   Calcium, Ion 1.17  1.13 - 1.30 mmol/L   TCO2 29  0 - 100 mmol/L   Hemoglobin 17.0  13.0 - 17.0 g/dL   HCT 50.0  39.0 - 52.0 %  POCT I-STAT TROPONIN I      Result Value Ref Range   Troponin i, poc 0.00  0.00 - 0.08 ng/mL   Comment 3             Imaging Review Ct Head Wo Contrast  09/15/2013   CLINICAL DATA:  Right-sided weakness and slurred speech  EXAM: CT HEAD WITHOUT CONTRAST  TECHNIQUE: Contiguous axial images were obtained from the base of the skull through the vertex without intravenous contrast.  COMPARISON:  None.  FINDINGS: There is chronic diffuse atrophy. Chronic bilateral periventricular white matter small vessel ischemic changes noted. There is no midline shift, hydrocephalus, or mass. No acute hemorrhage or acute transcortical infarct is identified. The bony calvarium is intact. There is mucoperiosteal thickening of bilateral ethmoid and bilateral maxillary sinuses. Air-fluid level is identified  in the left maxillary sinus consistent with acute sinusitis.  IMPRESSION: No focal acute intracranial abnormality identified. Chronic diffuse atrophy. Chronic bilateral periventricular white matter small vessel ischemic change.  I attempted to call Dr. Alexis Goodell with Critical Value/emergent results at the time of interpretation on 09/15/2013 at 6:51 PM but could not reach her.   Electronically Signed   By: Abelardo Diesel M.D.   On: 09/15/2013 18:57    EKG Interpretation    Date/Time:  Wednesday September 15 2013 18:52:38 EST Ventricular Rate:  55 PR Interval:  203 QRS Duration: 68 QT Interval:  407 QTC Calculation: 389 R Axis:   7 Text Interpretation:  Sinus or ectopic atrial rhythm Borderline low voltage, extremity leads RSR' in V1 or  V2, probably normal variant Minimal ST elevation, anterior leads No previous ECGs available Confirmed by Flonnie Wierman  MD, Sitara Cashwell ([id number]) on 09/15/2013 7:11:02 PM           CRITICAL CARE Performed by: Mervin Kung. Total critical care time: 30 Critical care time was exclusive of separately billable procedures and treating other patients. Critical care was necessary to treat or prevent imminent or life-threatening deterioration. Critical care was time spent personally by me on the following activities: development of treatment plan with patient and/or surrogate as well as nursing, discussions with consultants, evaluation of patient's response to treatment, examination of patient, obtaining history from patient or surrogate, ordering and performing treatments and interventions, ordering and review of laboratory studies, ordering and review of radiographic studies, pulse oximetry and re-evaluation of patient's condition.  MDM   Final diagnoses:  CVA (cerebral infarction)   Patient most likely with the CVA. An NIH stroke scale of 4. Not a candidate for TPA. Patient will require admission. Consult by neurology. Medicine admission with them following.  Patient has some right-sided weakness and a slight right-sided facial droop. Patient is mentating fine.     Mervin Kung, MD 09/15/13 [phone]

## 2013-09-15 NOTE — ED Notes (Signed)
Dr. Zackowski at bedside  

## 2013-09-15 NOTE — Progress Notes (Signed)
Code Stroke called at 1810 Patient arrived at 26.  Labs drawn, head CT done. NIHSS 4  Right facial droop, Right arm weakness, Right sensory deficit, slurred speech. Per patient and family he has not felt well all day.  He had been fatigues and generally ill.  LKW this afternoon about 1400.  Right sided weakness noticed at 1730.  Dr Doy Mince at bedside to assess patient

## 2013-09-15 NOTE — ED Notes (Signed)
Portable chest xray at bedside.

## 2013-09-15 NOTE — ED Notes (Signed)
Patient reports he does not have to urinate at this time. Informed patient to let staff know when he needs to use the restroom so we can obtain a sample. Pt agreed. Will re-assess shortly.

## 2013-09-16 ENCOUNTER — Inpatient Hospital Stay (HOSPITAL_COMMUNITY): Payer: Medicare HMO

## 2013-09-16 DIAGNOSIS — R471 Dysarthria and anarthria: Secondary | ICD-10-CM

## 2013-09-16 DIAGNOSIS — J449 Chronic obstructive pulmonary disease, unspecified: Secondary | ICD-10-CM

## 2013-09-16 DIAGNOSIS — E785 Hyperlipidemia, unspecified: Secondary | ICD-10-CM

## 2013-09-16 DIAGNOSIS — Z794 Long term (current) use of insulin: Secondary | ICD-10-CM

## 2013-09-16 DIAGNOSIS — M6281 Muscle weakness (generalized): Secondary | ICD-10-CM

## 2013-09-16 DIAGNOSIS — E119 Type 2 diabetes mellitus without complications: Secondary | ICD-10-CM

## 2013-09-16 DIAGNOSIS — R29810 Facial weakness: Secondary | ICD-10-CM

## 2013-09-16 DIAGNOSIS — E039 Hypothyroidism, unspecified: Secondary | ICD-10-CM

## 2013-09-16 LAB — RAPID URINE DRUG SCREEN, HOSP PERFORMED
Amphetamines: NOT DETECTED
BENZODIAZEPINES: NOT DETECTED
Barbiturates: NOT DETECTED
COCAINE: NOT DETECTED
Opiates: NOT DETECTED
Tetrahydrocannabinol: NOT DETECTED

## 2013-09-16 LAB — URINALYSIS, ROUTINE W REFLEX MICROSCOPIC
GLUCOSE, UA: NEGATIVE mg/dL
Hgb urine dipstick: NEGATIVE
KETONES UR: NEGATIVE mg/dL
Leukocytes, UA: NEGATIVE
Nitrite: NEGATIVE
PROTEIN: NEGATIVE mg/dL
Specific Gravity, Urine: 1.021 (ref 1.005–1.030)
Urobilinogen, UA: 1 mg/dL (ref 0.0–1.0)
pH: 7.5 (ref 5.0–8.0)

## 2013-09-16 LAB — HEMOGLOBIN A1C
Hgb A1c MFr Bld: 7.4 % — ABNORMAL HIGH (ref <5.7)
MEAN PLASMA GLUCOSE: 166 mg/dL — AB (ref <117)

## 2013-09-16 LAB — LIPID PANEL
CHOL/HDL RATIO: 6.2 ratio
Cholesterol: 216 mg/dL — ABNORMAL HIGH (ref 0–200)
HDL: 35 mg/dL — ABNORMAL LOW (ref >39)
LDL Cholesterol: 123 mg/dL — ABNORMAL HIGH (ref 0–99)
Triglycerides: 288 mg/dL — ABNORMAL HIGH (ref <150)
VLDL: 58 mg/dL — AB (ref 0–40)

## 2013-09-16 LAB — GLUCOSE, CAPILLARY
GLUCOSE-CAPILLARY: 118 mg/dL — AB (ref 70–99)
Glucose-Capillary: 143 mg/dL — ABNORMAL HIGH (ref 70–99)
Glucose-Capillary: 142 mg/dL — ABNORMAL HIGH (ref 70–99)

## 2013-09-16 LAB — TSH: TSH: 4.045 u[IU]/mL (ref 0.350–4.500)

## 2013-09-16 MED ORDER — ASPIRIN EC 81 MG PO TBEC
81.0000 mg | DELAYED_RELEASE_TABLET | Freq: Every day | ORAL | Status: DC
Start: 2013-09-16 — End: 2013-09-20
  Administered 2013-09-16 – 2013-09-20 (×5): 81 mg via ORAL
  Filled 2013-09-16 (×5): qty 1

## 2013-09-16 MED ORDER — CLOPIDOGREL BISULFATE 75 MG PO TABS
75.0000 mg | ORAL_TABLET | Freq: Every day | ORAL | Status: DC
  Administered 2013-09-16 – 2013-09-20 (×5): 75 mg via ORAL
  Filled 2013-09-16 (×5): qty 1

## 2013-09-16 MED ORDER — ATORVASTATIN CALCIUM 80 MG PO TABS
80.0000 mg | ORAL_TABLET | Freq: Every day | ORAL | Status: DC
  Administered 2013-09-16 – 2013-09-20 (×5): 80 mg via ORAL
  Filled 2013-09-16 (×5): qty 1

## 2013-09-16 NOTE — Discharge Summary (Signed)
[facility] Discharge Summary  Patient name: Brad Taylor Medical record number: [id number] Date of birth: 1951/07/24 Age: 63 y.o. Gender: male Date of Admission: 09/15/2013  Date of Discharge: 09/20/13 Admitting Physician: Lupita Dawn, MD  Primary Care Provider: Odette Fraction, MD Consultants: Neurology, CIR  Indication for Hospitalization: new RUE weakness, slurred speech, facial droop, concern for acute stroke  Discharge Diagnoses/Problem List:  Acute ischemic stroke (L-posterior limb internal capsule), residual RUE weakness and dysarthria - Improved Possible Paroxysmal Atrial Fibrillation, unconfirmed Type 2 DM, well-controlled Elevated BP - Improved Psych: Depression/PTSD Hypothyroidism GERD HLD COPD - Stable Bilateral LE ulcers, secondary to DM2  Disposition: CIR  Discharge Condition: Stable  Brief Hospital Course: ROBERTSON COLCLOUGH is a 63 y.o. male who presented with right facial droop, slurred speech, and right upper extremity weakness concerning for CVA, found to have acute ischemic stroke. PMH is significant for COPD, DM-2, Hypothyroidism, GERD, Depression, PTSD, and HLD.  # Acute ischemic stroke (L-posterior limb internal capsule), residual RUE weakness and dysarthria - Improved Presented to ED as Code Stroke, no prior h/o CVA, previously on home ASA 81 daily, initial work-up with Head CT negative for acute bleed / infarct, not tPA candidate d/t delayed presentation, NIHSS 4 with some noted improvement, treated with ASA 325, and Neurology continued to follow. admiitted to telemetry, obtained complete stroke work-up with MRI/MRA (confirm acute ischemic infarct and small vessel disease), Carotid Dopplers (nml) and ECHO (nml), Risk stratification labs (HgbA1c, Lipid Panel, TSH, results listed below). Placed on DAPT with ASA 81 + Plavix 75mg  daily (plan for 21 days, then switch to Plavix only). PT/OT/SLP evals recommend CIR, who accepted  patient, however d/t insurance clearance pt unable to be admitted over the weekend. Prior to transfer to [facility] service, pt experienced some worsening of RUE weakness, obtained repeat Head CT (09/19/13) without extension / hemorrhage. Patient transferred to [facility] service for continued rehab prior to discharge to home with HHPT. Overall, suspect etiology of CVA is embolic vs small vessel disease, and recommend outpatient cardiac monitoring with loop monitor to potentially capture paroxysmal atrial fibrillation.  # Possible Paroxysmal Atrial Fibrillation, unconfirmed On admission, in the ED, patient appeared to be in atrial fibrillation on the cardiac monitor, concern for potential PAF that may have led to embolic cause of CVA. Initial EKG obtained and revealed sinus arrythmia with 1st degree AV block, and repeat EKG in AM with Sinus bradycardia, normal p waves, no evidence of irregularity or AFib. Remained on telemetry for several days without events. Recommend outpatient follow-up for event / rhythm monitor to capture potential PAF as etiology.  # Type 2 DM, well controlled  HbgA1c 7.4, initially due to relatively low CBG on admission (90-140) did not resume home Lantus 60u BID, only covered with moderate SSI. Patient had previously not required any Lantus, resumed low dose Lantus 20u nightly + SSI, may titrate as needed.  # Mildly Elevated BP - Stable  No prior h/o dx HTN and did not take any anti-HTN meds prior to admission, allowed permissive HTN 24 hrs after CVA. Overall, BP has remained relatively stable. Given h/o DM2 and some mild BP elevations SBP 150s, started low dose ACEi with Lisinopril 10mg  daily.   # Hypothyroidism  Continued home Synthroid  # GERD  Continued PPI  # Psych: Depression/PTSD  Continued home medications as patient has been on them for years: Trazodone, Divalproex, BuSpar, Bupropion, Venlafaxine.  # HLD  Prior home dose Crestor 20mg  daily, which  was switched to Atorvastatin  80mg  daily.  # COPD - Stable  Patient is only on albuterol at home which his wife states she uses very infrequently. No evidence of acute exacerbation currently.  # B/l LE Ulcerations, secondary to h/o DM2  Consulted wound care, managing with dressings and topical treatment. Evidence of previously healed RLE ulceration. Persistent LLE ulceration with recommendation for Unna boot.  Issues for Follow Up: 1. Secondary Stroke Prevention - continue ASA 81mg  daily and Plavix 75mg  daily x 21 days, starting 10/07/13 DC ASA and only take Plavix 75mg  daily 2. Cardiac Monitor - Recommend referral for outpatient cardiology event monitor to dx vs r/o potential paroxysmal atrial fibrillation, which may have led to CVA. 3. DM Management - Home insulin regimen of Lantus 60u BID seems excessively high for patient, given CBGs stable during hospitalization with moderate SSI and low Lantus 20u nightly dosing. Consider increasing home dose Lantus if needed. 4. BP Management - No h/o HTN. Started low dose Lisinopril 10mg  daily for renal protection in DM pt 5. Continued PT  Significant Procedures: none  Significant Labs and Imaging:   Recent Labs Lab 09/15/13 1833 09/15/13 1845  WBC 10.2  --   HGB 15.9 17.0  HCT 45.3 50.0  PLT 175  --     Recent Labs Lab 09/15/13 1833 09/15/13 1845  NA 137 135*  K 5.0 4.6  CL 95* 95*  CO2 30  --   GLUCOSE 125* 124*  BUN 10 9  CREATININE 0.83 1.00  CALCIUM 9.2  --   ALKPHOS 64  --   AST 13  --   ALT 12  --   ALBUMIN 3.6  --    Hb A1C (7.4)  TSH (4.045)  Lipid panel (TC 216 / TG 288 / HDL 35 / LDL 123)  Troponin-I - negative  UA - negative  UTox - negative  Imaging/Diagnostic Tests:  2/11 Portable CXR 1v  IMPRESSION:  Negative except for mild cardiac enlargement  2/11 Head CT w/o contrast  IMPRESSION:  No focal acute intracranial abnormality identified. Chronic diffuse  atrophy. Chronic bilateral periventricular white matter small vessel  ischemic  change.  2/12 MRI Brain / MRA Head  IMPRESSION:  MRI HEAD:  1. Acute ischemic infarct involving the posterior limb of the left  internal capsule.  2. Additional subcentimeter ischemic infarct within the deep white  matter of the left centrum semi ovale.  3. Remote subcentimeter lacunar infarcts involving the bilateral  basal ganglia bilaterally.  4. Moderate cerebral atrophy and chronic microvascular ischemic  changes.  MRA HEAD:  1. Mild multi focal irregularity within the cavernous ICAs  bilaterally, left greater than right, likely related to underlying  atherosclerotic disease. No high-grade flow-limiting stenosis or  proximal branch arterial occlusion identified.  2. No intracranial aneurysm.  2/13 ECHO  - Left ventricle: The cavity size was normal. Systolic function was normal. The estimated ejection fraction was in the range of 60% to 65%. Wall motion was normal; there were no regional wall motion abnormalities. - Left atrium: The atrium was moderately dilated. - Atrial septum: No defect or patent foramen ovale was Identified.  2/15 Head CT w/o contrast IMPRESSION:  Evidence of patient's known ovoid acute infarct over the posterior  limb of the left internal capsule measuring approximately 1.2 x 1.6  cm.  Chronic ischemic microvascular disease. Old small lacunar infarct of  the left caudate.  Results/Tests Pending at Time of Discharge: none  Discharge Medications:  Medication List    STOP taking these medications       glipiZIDE 5 MG tablet  Commonly known as:  GLUCOTROL     rosuvastatin 20 MG tablet  Commonly known as:  CRESTOR  Replaced by:  atorvastatin 80 MG tablet      TAKE these medications       aspirin 81 MG tablet  Take 81 mg by mouth daily.     atorvastatin 80 MG tablet  Commonly known as:  LIPITOR  Take 1 tablet (80 mg total) by mouth daily at 6 PM.     buPROPion 150 MG 12 hr tablet  Commonly known as:  WELLBUTRIN SR  Take 150 mg by  mouth daily.     busPIRone 10 MG tablet  Commonly known as:  BUSPAR  Take 5 mg by mouth 2 (two) times daily as needed (for anxiety).     clopidogrel 75 MG tablet  Commonly known as:  PLAVIX  Take 1 tablet (75 mg total) by mouth daily with breakfast.     divalproex 500 MG DR tablet  Commonly known as:  DEPAKOTE  Take 1,000 mg by mouth at bedtime.     etodolac 400 MG tablet  Commonly known as:  LODINE  Take 400 mg by mouth 2 (two) times daily.     insulin glargine 100 UNIT/ML injection  Commonly known as:  LANTUS  Inject 0.2 mLs (20 Units total) into the skin at bedtime.     levothyroxine 125 MCG tablet  Commonly known as:  SYNTHROID, LEVOTHROID  Take 125 mcg by mouth daily before breakfast.     lisinopril 10 MG tablet  Commonly known as:  PRINIVIL,ZESTRIL  Take 1 tablet (10 mg total) by mouth daily.     RABEprazole 20 MG tablet  Commonly known as:  ACIPHEX  Take 40 mg by mouth daily before breakfast.     Silver Nitrate Soln  1 application by Does not apply route every other day.     terazosin 10 MG capsule  Commonly known as:  HYTRIN  Take 10 mg by mouth at bedtime.     traZODone 100 MG tablet  Commonly known as:  DESYREL  Take 300 mg by mouth at bedtime.     venlafaxine XR 150 MG 24 hr capsule  Commonly known as:  EFFEXOR-XR  Take 150 mg by mouth 2 (two) times daily.        Discharge Instructions: Please refer to Patient Instructions section of EMR for full details.  Patient was counseled important signs and symptoms that should prompt return to medical care, changes in medications, dietary instructions, activity restrictions, and follow up appointments.   Follow-Up Appointments: Follow-up Information   Follow up with [facility] TOM, MD In 2 weeks. (please call to schedule hospital follow-up after discharge from inpatient rehab)    Specialty:  [facility] Medicine   Contact information:   [address] [place] [address] [phone]        Nobie Putnam, DO 09/20/2013, 2:45 PM PGY-1, [place]

## 2013-09-16 NOTE — Progress Notes (Signed)
Utilization review completed.  

## 2013-09-16 NOTE — Progress Notes (Signed)
Rehab Admissions Coordinator Note:  Patient was screened by Retta Diones for appropriateness for an Inpatient Acute Rehab Consult.  At this time, we are recommending Inpatient Rehab consult.  Retta Diones 09/16/2013, 5:02 PM  I can be reached at [phone].

## 2013-09-16 NOTE — H&P (Signed)
FMTS Attending Note  I personally saw and evaluated the patient. The plan of care was discussed with the resident team. I agree with the assessment and plan as documented by the resident.   63 year old Caucasian male with past medical history of COPD, insulin-dependent type 2 diabetes, hypothyroidism, depression, PTSD, and hyperlipidemia presents with right upper extremity weakness, right facial droop, and dysarthria. Symptoms began approximately around 5 PM last evening. Patient reports generalized weakness and fatigue throughout the day yesterday. Patient was evaluated in the ED last evening and was determined not to be a candidate for TPA. Patient states that he has had some improvement of the right upper extremity weakness. Patient is on aspirin 81 mg at home. No previous history of CVA. Please refer to resident note for further history of present illness.  Vitals: Reviewed  General: Pleasant Caucasian male, no acute distress HEENT: Normocephalic, pupils equal round and reactive to light, extraocular movements are intact, no scleral icterus, mild right ptosis, nasal septum midline, no rhinorrhea, moist mucous membranes, uvula midline, neck supple, no anterior or posterior cervical lymphadenopathy Cardiac: Regular rate and rhythm with occasional irregular beats, 3/6 systolic murmur, no heaves or thrills, no JVD Respiratory: Clear to auscultation bilaterally, normal effort Abdomen: Soft, nontender, bowel sounds present Neuro: Cranial nerves II through XII intact, right ptosis, patient no longer has facial droop, right upper extremity strength testing was 4/5, strength testing for left upper extremity, right lower extremity, and left lower extremity was 5 out of 5, sensation to light touch was grossly intact in all extremities, patient had difficulty performing pronator drift secondary to right upper extremity weakness, able to perform finger to nose testing bilaterally, patellar reflexes were 2+  bilaterally, biceps reflexes are 2+ bilaterally Extremities: Trace bilateral edema, bandages over bilateral ankles  Reviewed CT head EKG performed in ER showed sinus arrhythmia Telemetry shows sinus rhythm with occasional PVCs Reviewed lab work completed in the emergency room  Assessment and plan: 63 year old male admitted with right upper extremity weakness, slurred speech, and facial droop concerning for CVA. 1. Right-sided weakness-awaiting further workup including MRI/MRA brain, carotid Dopplers, and echocardiogram. Agree with wrist medication as outlined in the resident note, appreciate neurology recommendations and help from physical therapy/outpatient therapy, agree with aspirin 325 mg daily (home dose of 81 mg daily), discuss need for Plavix with neurology team, continue to monitor telemetry for signs of atrial fibrillation, stroke workup negative patient may need implantable loop recorder to rule out PAF 2. Hyperlipidemia-continue atorvastatin 40 mg daily 3. Insulin-dependent type 2 diabetes-continue home Lantus with sliding scale, check hemoglobin A1c 4. Hypothyroidism-continue home Synthroid  5. Depression/PTSD-continue home medications 6. COPD- albuterol as needed  Dossie Arbour MD

## 2013-09-16 NOTE — Plan of Care (Signed)
Problem: Progression Outcomes Goal: Tolerating diet/TF at goal rate Outcome: Completed/Met Date Met:  09/16/13 Is reluctant to attempt to feed self even after being set up by this nurse.  Encouraged him to do what he can.

## 2013-09-16 NOTE — Progress Notes (Signed)
Family Medicine Teaching Service Daily Progress Note Intern Pager: [phone]  Patient name: Brad Taylor Medical record number: [id number] Date of birth: 01/03/1951 Age: 63 y.o. Gender: male  Primary Care Provider: Odette Fraction, MD Consultants: Neurology Code Status: Full  Pt Overview and Major Events to Date:  2/11 - Admitted code stroke, Head CT neg, not tPA candidate, NIH 4, ASA, work-up 2/12 - switch to ASA 81 + Plavix 75, ordered MRI/MRA, carotids (prelim neg), ordered ECHO, PT/OT/SLP  Assessment and Plan: Brad Taylor is a 63 y.o. male presenting with right facial droop, slurred speech, and right upper extremity weakness concerning for CVA. PMH is significant for COPD, DM-2, Hypothyroidism, GERD, Depression, PTSD, and HLD.  # Acute Stroke, residual symptoms (RUE weakness, dysarthria) improving Admitted code stroke, no prior h/o CVA, previously on home ASA 81 daily, initial work-up with  Head CT negative for acute bleed / infarct, not tPA candidate, NIH 4 (THRIVE score calculation of prognosis / recovery 86-88% - currently clinically stable, mildly elevated BP at 141/69 (no prior HTN) and currently not on any anti-HTN, previously allowed permissive HTN. - telemetry - Stroke work up: ordered MRI/MRA, 2D ECHO - Carotid dopplers (prelim negative b/l) - Risk stratification labs: Hb A1C (7.4), TSH (4.045), Lipid panel (TC 216 / TG 288 / HDL 35 / LDL 123) - Frequent neuro checks  - Antiplatelet therapy - DC'd ASA 325, switch to DAPT with ASA 81 + Plavix 75mg  daily, expect for DAPT x 21 days, then switch to Plavix only - PT/OT/SLP consults  # Type 2 DM, well controlled - Hb A1C 7.4 - CBGs stable 90-140 - CBG's TIDAC and QHS  - SSI Moderate  - Consider Lantus dose if uncontrolled  # Irregular HR, concern for possible PAF - Stable - In the ED, patient appeared to be in atrial fibrillation on the cardiac monitor, concern for potential PAF that may have triggered CVA - EKG  obtained and revealed sinus arrythmia with 1st degree AV block (per my reading).  - repeat EKG in AM with Sinus bradycardia, normal p waves, no evidence of irregularity or AFib - monitor telemetry  # Elevated BP - Stable  No prior h/o dx HTN, allowed permissive HTN 24 hrs after CVA - Will monitor closley  # Hypothyroidism  - Continuing home Synthroid  # GERD  - Continuing PPI (using formulary protonix)  # Psych: Depression/PTSD  - Continuing home medications as patient has been on them for years: Trazodone, Divalproex, BuSpar, Bupropion, Venlafaxine.  # HLD  - prior home dose Crestor 20mg  daily - switch to max high dose therapy --> Atorvastatin 80mg  daily  # COPD - Stable - Patient is only on albuterol at home which his wife states she uses very infrequently  - No evidence of acute exacerbation currently  - Monitor closely during admission  FEN/GI: SL IV, Carb modified diet  Prophylaxis: Lovenox  Disposition: Pending further workup and PT/OT evaluations/recommendations, expect patient would likely be discharged home in 1-2 days  Subjective: No acute events overnight. Patient reports he is doing well this morning, persistent right arm weakness, however significantly improved as he is able to lift his arm above his head but still weak grip. Admits that speech still slurred, but denies any other concerns at this time. Family at bedside, awaiting further testing with MRI and PT/OT/SLP eval.  Objective: Temp:  [97.4 F (36.3 C)-97.8 F (36.6 C)] 97.7 F (36.5 C) (02/12 1000) Pulse Rate:  [53-63] 60 (02/12 1000) Resp:  [  14-24] 18 (02/12 1000) BP: (122-161)/(54-92) 141/69 mmHg (02/12 1000) SpO2:  [93 %-100 %] 97 % (02/12 1000) Weight:  [276 lb 10.8 oz (125.5 kg)] 276 lb 10.8 oz (125.5 kg) (02/11 2204) Physical Exam: General: obese male, resting in bed, NAD.  HEENT: PERRL, EOMI, MMM.  Cardiovascular: RRR, 2/6 systolic murmur heard.  Respiratory: Mostly CTAB with occasional  scattered wheezes, without focal crackles. Normal WOB  Abdomen: obese, soft, NTND. Surgical scar noted from prior cholecystectomy.  Extremities: Trace LE edema with hyperpigmentation consistent with chronic venous insufficiency, b/l LE ankles/feet wrapped in gauze and ace wrap (noted ulceration)  Skin: Right ankle - small healing ulceration (approximately 0.5 cm x 0.5 cm). Left ankle - 1 cm x 1 cm ulceration noted.  Neuro: AO x 3. Persistent mild slurred speech. CN 2-12 intact. No appreciated R facial droop. Muscle strength - normal 5/5 in 3 ext, except 4/5 in RUE.  Laboratory:  Recent Labs Lab 09/15/13 1833 09/15/13 1845  WBC 10.2  --   HGB 15.9 17.0  HCT 45.3 50.0  PLT 175  --     Recent Labs Lab 09/15/13 1833 09/15/13 1845  NA 137 135*  K 5.0 4.6  CL 95* 95*  CO2 30  --   BUN 10 9  CREATININE 0.83 1.00  CALCIUM 9.2  --   PROT 7.0  --   BILITOT 0.3  --   ALKPHOS 64  --   ALT 12  --   AST 13  --   GLUCOSE 125* 124*   Hb A1C (7.4) TSH (4.045) Lipid panel (TC 216 / TG 288 / HDL 35 / LDL 123) Troponin-I - negative  Imaging/Diagnostic Tests:  2/11 Portable CXR 1v IMPRESSION:  Negative except for mild cardiac enlargement  2/11 Head CT w/o contrast IMPRESSION:  No focal acute intracranial abnormality identified. Chronic diffuse  atrophy. Chronic bilateral periventricular white matter small vessel  ischemic change.  Nobie Putnam, DO 09/16/2013, 1:33 PM PGY-1, [place] Intern pager: [phone], text pages welcome

## 2013-09-16 NOTE — Progress Notes (Signed)
SLP Cancellation Note  Patient Details Name: Brad Taylor MRN: [id number] DOB: 01/18/1951   Cancelled treatment:        Attempted speech/language evaluation.  Per family report, pt did not sleep well last night, and is currently unable to maintain alertness to participate in valid and reliable eval.  Brief interaction with pt/family indicates at least moderate dysarthria.  Will continue efforts for full evaluation.  [name] B. Quentin Ore [facility], CCC-SLP [phone]  [name] 09/16/2013, 3:49 PM

## 2013-09-16 NOTE — Evaluation (Signed)
Physical Therapy Evaluation Patient Details Name: Brad Taylor MRN: [id number] DOB: Aug 11, 1950 Today's Date: 09/16/2013 Time: 0092-3300 PT Time Calculation (min): 22 min  PT Assessment / Plan / Recommendation History of Present Illness  63 y.o. male admitted to [facility] on 09/15/13 with past medical history of COPD, insulin-dependent type 2 diabetes, hypothyroidism, depression, PTSD, and hyperlipidemia presents with right upper extremity weakness, right facial droop, and dysarthria.  CT negative. MRI pending for possible stroke.    Clinical Impression  Pt presents like a left sided CVA with right sided weakness and decreased coordination as well as language issues.  Pt would benefit from extensive multi disciplinary therapy before returning home with his wife's assist at discharge.  I am recommending inpatient rehab consult.        PT Assessment  Patient needs continued PT services    Follow Up Recommendations  CIR    Does the patient have the potential to tolerate intense rehabilitation     NA  Barriers to Discharge   None      Equipment Recommendations  Rolling walker with 5" wheels    Recommendations for Other Services Rehab consult   Frequency Min 4X/week    Precautions / Restrictions Precautions Precautions: Fall Precaution Comments: unsteady on his feet   Pertinent Vitals/Pain See vitals flow sheet.       Mobility  Bed Mobility Overal bed mobility: Needs Assistance Bed Mobility: Supine to Sit Supine to sit: Min assist General bed mobility comments: Min hand held assist to pull up to sitting.  Transfers Overall transfer level: Needs assistance Transfers: Sit to/from Stand Sit to Stand: Min assist General transfer comment: min assist to support trunk to get to standing for balance.  Pt reports initially being lightheaded upon standing, so stood EOB x 2 mins before starting to walk.   Ambulation/Gait Ambulation/Gait assistance: +2 safety/equipment;Min  assist Ambulation Distance (Feet): 200 Feet Assistive device: 2 person hand held assist Gait Pattern/deviations: Step-through pattern;Staggering left;Staggering right;Wide base of support Gait velocity: decreased Gait velocity interpretation: Below normal speed for age/gender General Gait Details: Pt with staggering gait pattern, increased LOB when turning to look at something or turning corners.  Signs of decreased coordination of his right leg during gait.  Modified Rankin (Stroke Patients Only) Pre-Morbid Rankin Score: No symptoms Modified Rankin: Moderately severe disability        PT Diagnosis: Difficulty walking;Abnormality of gait;Generalized weakness;Hemiplegia dominant side  PT Problem List: Decreased strength;Decreased activity tolerance;Decreased mobility;Decreased balance;Decreased coordination;Decreased knowledge of use of DME PT Treatment Interventions: DME instruction;Gait training;Stair training;Functional mobility training;Therapeutic activities;Therapeutic exercise;Balance training;Neuromuscular re-education;Patient/family education     PT Goals(Current goals can be found in the care plan section) Acute Rehab PT Goals Patient Stated Goal: to go home PT Goal Formulation: With patient/family Time For Goal Achievement: 09/30/13 Potential to Achieve Goals: Good  Visit Information  Last PT Received On: 09/16/13 Assistance Needed: +1 Reason Eval/Treat Not Completed: Other (comment) (pt is on bedrest) History of Present Illness: 63 y.o. male admitted to [facility] on 09/15/13 with past medical history of COPD, insulin-dependent type 2 diabetes, hypothyroidism, depression, PTSD, and hyperlipidemia presents with right upper extremity weakness, right facial droop, and dysarthria.  CT negative. MRI pending for possible stroke.         Prior [place] expects to be discharged to:: Private residence Living Arrangements: Spouse/significant  other Available Help at Discharge: Family;Available 24 hours/day Type of Home: House Home Access: Stairs to enter [employer] of Steps: 3  Entrance Stairs-Rails: Right Home Layout: One level Home Equipment: Cane - single point Prior Function Level of Independence: Independent;Independent with assistive device(s) Comments: Pt uses cane for distance walking PTA.   Communication Communication: Expressive difficulties Dominant Hand: Right    Cognition  Cognition Arousal/Alertness: Awake/alert Behavior During Therapy: WFL for tasks assessed/performed Overall Cognitive Status: Difficult to assess Difficult to assess due to: Impaired communication    Extremity/Trunk Assessment Upper Extremity Assessment Upper Extremity Assessment: Defer to OT evaluation Lower Extremity Assessment Lower Extremity Assessment: RLE deficits/detail RLE Deficits / Details: 4/5 strength throughout RLE Coordination: decreased gross motor Cervical / Trunk Assessment Cervical / Trunk Assessment: Normal   Balance Balance Overall balance assessment: Needs assistance Sitting-balance support: Bilateral upper extremity supported;Feet supported Sitting balance-Leahy Scale: Good Standing balance support: Bilateral upper extremity supported Standing balance-Leahy Scale: Poor Standing balance comment: During dynamic task- gait General Comments General comments (skin integrity, edema, etc.): Pt's dysarthria seemed to worsen with upright posture and gait.  BP taken and although elevated, not to the point that I would think would produce increased dysarthria.    End of Session PT - End of Session Equipment Utilized During Treatment: Gait belt Activity Tolerance: Patient tolerated treatment well Patient left: in chair;with call bell/phone within reach;with family/visitor present    Wells Guiles B. [place], [place], DPT [phone]   09/16/2013, 3:55 PM

## 2013-09-16 NOTE — Progress Notes (Signed)
Subjective: Patient has no complaints.  Feels his right arm is slightly stronger but he remains dysarthric.   MRI pending Echo and Carotids Pending LDL 123 A1c 7.4   Objective: Current vital signs: BP 149/92  Pulse 63  Temp(Src) 97.7 F (36.5 C) (Oral)  Resp 20  Ht 6' (1.829 m)  Wt 125.5 kg (276 lb 10.8 oz)  BMI 37.52 kg/m2  SpO2 100% Vital signs in last 24 hours: Temp:  [97.4 F (36.3 C)-97.8 F (36.6 C)] 97.7 F (36.5 C) (02/12 0800) Pulse Rate:  [53-63] 63 (02/12 0800) Resp:  [14-24] 20 (02/12 0800) BP: (122-161)/(54-92) 149/92 mmHg (02/12 0800) SpO2:  [93 %-100 %] 100 % (02/12 0800) Weight:  [125.5 kg (276 lb 10.8 oz)] 125.5 kg (276 lb 10.8 oz) (02/11 2204)  Intake/Output from previous day: 02/11 0701 - 02/12 0700 In: 225 [P.O.:222; I.V.:3] Out: 775 [Urine:775] Intake/Output this shift:   Nutritional status: Carb Control  Neurologic Exam: Mental Status: Alert, oriented, thought content appropriate.  Speech dysarthric.  Able to follow 3 step commands without difficulty. Cranial Nerves: II:  Visual fields grossly normal, pupils equal, round, reactive to light and accommodation III,IV, VI: ptosis not present, extra-ocular motions intact bilaterally V,VII: mild right facial droop, facial light touch sensation normal bilaterally VIII: hearing normal bilaterally IX,X: gag reflex present XI: bilateral shoulder shrug XII: midline tongue extension without atrophy or fasciculations  Motor: Right : Upper extremity   4/5    Left:     Upper extremity   5/5  Lower extremity   5/5     Lower extremity   5/5 Tone and bulk:normal tone throughout; no atrophy noted Sensory: Pinprick and light touch intact throughout, bilaterally Deep Tendon Reflexes:  Right: Upper Extremity   Left: Upper extremity   biceps (C-5 to C-6) 2/4   biceps (C-5 to C-6) 2/4 tricep (C7) 2/4    triceps (C7) 2/4 Brachioradialis (C6) 2/4  Brachioradialis (C6) 2/4  Lower Extremity Lower Extremity   quadriceps (L-2 to L-4) 2/4   quadriceps (L-2 to L-4) 2/4 Achilles (S1) 0/4   Achilles (S1) 0/4 Plantars: Right: downgoing   Left: downgoing Cerebellar: normal finger-to-nose,  normal heel-to-shin test Gait: unable to test safely CV: pulses palpable throughout    Lab Results: Basic Metabolic Panel:  Recent Labs Lab 09/15/13 1833 09/15/13 1845  NA 137 135*  K 5.0 4.6  CL 95* 95*  CO2 30  --   GLUCOSE 125* 124*  BUN 10 9  CREATININE 0.83 1.00  CALCIUM 9.2  --     Liver Function Tests:  Recent Labs Lab 09/15/13 1833  AST 13  ALT 12  ALKPHOS 64  BILITOT 0.3  PROT 7.0  ALBUMIN 3.6   No results found for this basename: LIPASE, AMYLASE,  in the last 168 hours No results found for this basename: AMMONIA,  in the last 168 hours  CBC:  Recent Labs Lab 09/15/13 1833 09/15/13 1845  WBC 10.2  --   NEUTROABS 4.8  --   HGB 15.9 17.0  HCT 45.3 50.0  MCV 90.2  --   PLT 175  --     Cardiac Enzymes:  Recent Labs Lab 09/15/13 1833  TROPONINI <0.30    Lipid Panel:  Recent Labs Lab 09/16/13 0415  CHOL 216*  TRIG 288*  HDL 35*  CHOLHDL 6.2  VLDL 58*  LDLCALC 123*    CBG:  Recent Labs Lab 09/15/13 1856 09/15/13 2301 09/16/13 0856  GLUCAP 132* 90 143*  Microbiology: No results found for this or any previous visit.  Coagulation Studies:  Recent Labs  09/15/13 1833  LABPROT 13.3  INR 1.03    Imaging: Ct Head Wo Contrast  09/15/2013   CLINICAL DATA:  Right-sided weakness and slurred speech  EXAM: CT HEAD WITHOUT CONTRAST  TECHNIQUE: Contiguous axial images were obtained from the base of the skull through the vertex without intravenous contrast.  COMPARISON:  None.  FINDINGS: There is chronic diffuse atrophy. Chronic bilateral periventricular white matter small vessel ischemic changes noted. There is no midline shift, hydrocephalus, or mass. No acute hemorrhage or acute transcortical infarct is identified. The bony calvarium is intact. There  is mucoperiosteal thickening of bilateral ethmoid and bilateral maxillary sinuses. Air-fluid level is identified in the left maxillary sinus consistent with acute sinusitis.  IMPRESSION: No focal acute intracranial abnormality identified. Chronic diffuse atrophy. Chronic bilateral periventricular white matter small vessel ischemic change.  I attempted to call Dr. Alexis Goodell with Critical Value/emergent results at the time of interpretation on 09/15/2013 at 6:51 PM but could not reach her.   Electronically Signed   By: Abelardo Diesel M.D.   On: 09/15/2013 18:57   Dg Chest Port 1 View  09/15/2013   CLINICAL DATA:  Code stroke, smoker  EXAM: PORTABLE CHEST - 1 VIEW  COMPARISON:  None.  FINDINGS: Mild cardiac enlargement.  Vascular pattern normal.  Lungs clear.  IMPRESSION: Negative except for mild cardiac enlargement   Electronically Signed   By: Skipper Cliche M.D.   On: 09/15/2013 19:32    Medications:  Scheduled: . aspirin EC  81 mg Oral Daily  . atorvastatin  80 mg Oral q1800  . buPROPion  150 mg Oral Daily  . clopidogrel  75 mg Oral Q breakfast  . divalproex  1,000 mg Oral QHS  . enoxaparin (LOVENOX) injection  40 mg Subcutaneous Q24H  . insulin aspart  0-15 Units Subcutaneous TID WC  . levothyroxine  125 mcg Oral QAC breakfast  . pantoprazole  40 mg Oral Daily  . sodium chloride  3 mL Intravenous Q12H  . traZODone  300 mg Oral QHS  . venlafaxine XR  150 mg Oral BID    Assessment/Plan: Symptoms continue.  Acute infarct suspected.  MRI and MRA are pending.  A1c 7.4.  LDL 123.  Echocardiogram pending.  Carotid dopplers.show no hemodynamically significant stenosis.    Recommendations: 1.  Will follow up results of MRI/A 2.  Diabetes management 3.  Aggressive management of cholesterol     LOS: 1 day   Alexis Goodell, MD [facility] [phone]  09/16/2013  10:42 AM

## 2013-09-16 NOTE — Progress Notes (Signed)
PT Cancellation Note  Patient Details Name: GABE GLACE MRN: [id number] DOB: [birth year]   Cancelled Treatment:    Reason Eval/Treat Not Completed: Other (comment) (pt is on bedrest).  PT paged MD to change activity orders if appropriate.   Thanks,   Barbarann Ehlers. Aliscia Clayton, [place], DPT [phone]   09/16/2013, 1:57 PM

## 2013-09-16 NOTE — Progress Notes (Signed)
Family Medicine Teaching Service Attending Note  I discussed patient Brad Taylor  with Dr. Raliegh Ip and reviewed their note for today.  I agree with their assessment and plan.

## 2013-09-16 NOTE — Progress Notes (Signed)
VASCULAR LAB PRELIMINARY  PRELIMINARY  PRELIMINARY  PRELIMINARY  Carotid Dopplers completed.    Preliminary report:  1-39% ICA stenosis.  Vertebral artery flow is antegrade. Branson Kranz, RVT 09/16/2013, 8:58 AM

## 2013-09-17 DIAGNOSIS — I6330 Cerebral infarction due to thrombosis of unspecified cerebral artery: Secondary | ICD-10-CM

## 2013-09-17 DIAGNOSIS — I517 Cardiomegaly: Secondary | ICD-10-CM

## 2013-09-17 LAB — GLUCOSE, CAPILLARY
GLUCOSE-CAPILLARY: 168 mg/dL — AB (ref 70–99)
Glucose-Capillary: 147 mg/dL — ABNORMAL HIGH (ref 70–99)
Glucose-Capillary: 211 mg/dL — ABNORMAL HIGH (ref 70–99)
Glucose-Capillary: 185 mg/dL — ABNORMAL HIGH (ref 70–99)
Glucose-Capillary: 143 mg/dL — ABNORMAL HIGH (ref 70–99)

## 2013-09-17 MED ORDER — SODIUM CHLORIDE 0.9 % IV SOLN: 250.0000 mL | INTRAVENOUS | Status: DC | PRN

## 2013-09-17 MED ORDER — LISINOPRIL 10 MG PO TABS
10.0000 mg | ORAL_TABLET | Freq: Every day | ORAL | Status: DC
  Administered 2013-09-17 – 2013-09-20 (×4): 10 mg via ORAL
  Filled 2013-09-17 (×4): qty 1

## 2013-09-17 NOTE — Progress Notes (Signed)
  Echocardiogram 2D Echocardiogram has been performed.  Mauricio Po 09/17/2013, 9:02 AM

## 2013-09-17 NOTE — Progress Notes (Signed)
Family Medicine Teaching Service Attending Note  I interviewed and examined patient Brad Taylor and reviewed their tests and x-rays.  I discussed with Dr. Raliegh Ip and reviewed their note for today.  I agree with their assessment and plan.     Additionally  Alert interactive Continued right sided deficits Continue cva prevention work up and rehab

## 2013-09-17 NOTE — Progress Notes (Signed)
Family Medicine Teaching Service Daily Progress Note Intern Pager: [phone]  Patient name: Brad Taylor Medical record number: [id number] Date of birth: [birth year] Age: 63 y.o. Gender: male  Primary Care Provider: Odette Fraction, MD Consultants: Neurology Code Status: Full  Pt Overview and Major Events to Date:  2/11 - Admitted code stroke, Head CT neg, not tPA candidate, NIH 4, ASA, work-up 2/12 - switch to ASA 81 + Plavix 75, MRI/MRA (acute L-CVA), carotids (neg), PT/OT/SLP 2/13 - CIR agrees with admission (pending acceptance / ins clearance), ECHO (nml EF, no embolic etiology)  Assessment and Plan: Brad Taylor is a 62 y.o. male presenting with right facial droop, slurred speech, and right upper extremity weakness concerning for CVA. PMH is significant for COPD, DM-2, Hypothyroidism, GERD, Depression, PTSD, and HLD.  # Acute Stroke (L-post L-internal capsule), residual symptoms (RUE weakness, dysarthria) - Improved Admitted code stroke, no prior h/o CVA, previously on home ASA 81 daily, initial work-up with  Head CT negative for acute bleed / infarct, not tPA candidate, NIH 4 (THRIVE score calculation of prognosis / recovery 86-88% - currently clinically stable, mildly elevated BP at 141/69 (no prior HTN) and currently not on any anti-HTN, previously allowed permissive HTN. - telemetry, neuro checks - MRI Brain / MRA Head (2/12) - acute ischemic infarct L-posterior limb internal capsule, remote L lacunar infarcts, with mod cerebral atrophy / chronic microvascular ischemia, L>R focal irregularity within cavernous ICAs - Stroke work-up: Carotid dopplers (negative b/l), 2D ECHO (nml EF 123456, no embolic source) - Risk stratification labs: Hb A1C (7.4), TSH (4.045), Lipid panel (TC 216 / TG 288 / HDL 35 / LDL 123) - Antiplatelet therapy - DAPT with ASA 81 + Plavix 75mg  daily, expect for DAPT x 21 days, then switch to Plavix only - PT/SLP consults --> recommend CIR, screened for  appropriateness - c/s CIR re: inpatient rehab in setting of acute CVA - greatly appreciate recommendations       - CIR eval determines patient is appropriate for CIR, except 7-9 day length rehab, excellent prognosis, with plan to DC home and continued HH therapy       - However, OT has not eval'd patient yet, pending (will contact today)       - CIR admissions raises concern re: [employer] Medicare ins, may take >24 hrs to get approved, and potential for denied. Patient disposition, currently unclear  # Type 2 DM, well controlled - Hb A1C 7.4 - CBGs stable 110-170 - CBG's TIDAC and QHS  - SSI Moderate  - Consider Lantus dose if uncontrolled  # Concern for possible PAF - Stable - In the ED, patient appeared to be in atrial fibrillation on the cardiac monitor, concern for potential PAF that may have triggered CVA - EKG obtained and revealed sinus arrythmia with 1st degree AV block (per my reading).  - repeat EKG in AM with Sinus bradycardia, normal p waves, no evidence of irregularity or AFib - DC'd telemetry without events - currently NSR - Recommend outpatient follow-up for event / rhythm monitor to capture potential PAF as etiology  # Mildly Elevated BP - Stable  No prior h/o dx HTN, allowed permissive HTN 24 hrs after CVA - Stable BP 120-150s / 80-90s - started Lisinopril 10mg  daily (no prior ACEi, given h/o DM2)  # Hypothyroidism  - Continuing home Synthroid  # GERD  - Continuing PPI (using formulary protonix)  # Psych: Depression/PTSD  - Continuing home medications as patient has been on them  for years: Trazodone, Divalproex, BuSpar, Bupropion, Venlafaxine.  # HLD  - prior home dose Crestor 20mg  daily - Atorvastatin 80mg  daily  # COPD - Stable - Patient is only on albuterol at home which his wife states she uses very infrequently  - No evidence of acute exacerbation currently  - Monitor closely during admission  # B/l LE Ulcerations, secondary to h/o DM2 - Currently  wrapped and frequent dressing changes  FEN/GI: SL IV, Carb modified diet  Prophylaxis: Lovenox  Disposition: Pending further workup in setting of acute CVA, PT/OT/SLP recommendations and further evaluation by CIR, expect patient to likely be transferred to CIR service vs discharge home in 1-2 days. Potentially disposition may be unclear currently, awaiting CIR admissions information / insurance clearance, and do not feel comfortable with patient discharging to home today.  Subjective: No acute events overnight. Patient reports feeling better this morning, but still c/o of slurred speech (states slightly improved today) and persistent Right arm weakness. Denies any other concerns. Discussed results of MRI, understands that he had a confirmed stroke. States PT going well, but he is eager to go home, agreeable to discussing rehab with CIR.  Objective: Temp:  [97.6 F (36.4 C)-98.1 F (36.7 C)] 97.6 F (36.4 C) (02/13 0400) Pulse Rate:  [60-62] 62 (02/13 0400) Resp:  [18-20] 18 (02/13 0400) BP: (120-159)/(67-92) 152/81 mmHg (02/13 0400) SpO2:  [92 %-99 %] 96 % (02/13 0400) Weight:  [270 lb 15.1 oz (122.9 kg)] 270 lb 15.1 oz (122.9 kg) (02/13 0400) Physical Exam: General: obese male, resting in bed, NAD.  HEENT: PERRL, EOMI, MMM.  Cardiovascular: RRR, 2/6 systolic murmur heard.  Respiratory: Mostly CTAB with occasional scattered wheezes, without focal crackles. Mild diminished air movement b/l. Normal WOB  Abdomen: obese, soft, NTND. Surgical scar noted from prior cholecystectomy.  Extremities: Trace LE edema with hyperpigmentation consistent with chronic venous insufficiency, b/l LE ankles/feet wrapped in gauze and ace wrap (noted ulceration)  Skin: Right ankle - small healing ulceration (approximately 0.5 cm x 0.5 cm). Left ankle - 1 cm x 1 cm ulceration noted.  Neuro: AO x 3. Persistent mild slurred speech (w/o significant improvement). CN 2-12 intact. No appreciated R facial droop. Muscle  strength - normal 5/5 in 3 ext, except 4/5 in RUE (unchanged)  Laboratory:  Recent Labs Lab 09/15/13 1833 09/15/13 1845  WBC 10.2  --   HGB 15.9 17.0  HCT 45.3 50.0  PLT 175  --     Recent Labs Lab 09/15/13 1833 09/15/13 1845  NA 137 135*  K 5.0 4.6  CL 95* 95*  CO2 30  --   BUN 10 9  CREATININE 0.83 1.00  CALCIUM 9.2  --   PROT 7.0  --   BILITOT 0.3  --   ALKPHOS 64  --   ALT 12  --   AST 13  --   GLUCOSE 125* 124*   Hb A1C (7.4) TSH (4.045) Lipid panel (TC 216 / TG 288 / HDL 35 / LDL 123) Troponin-I - negative UA - negative UTox - negative  Imaging/Diagnostic Tests:  2/11 Portable CXR 1v IMPRESSION:  Negative except for mild cardiac enlargement  2/11 Head CT w/o contrast IMPRESSION:  No focal acute intracranial abnormality identified. Chronic diffuse  atrophy. Chronic bilateral periventricular white matter small vessel  ischemic change.  2/12 MRI Brain / MRA Head IMPRESSION:  MRI HEAD:  1. Acute ischemic infarct involving the posterior limb of the left  internal capsule.  2. Additional subcentimeter  ischemic infarct within the deep white  matter of the left centrum semi ovale.  3. Remote subcentimeter lacunar infarcts involving the bilateral  basal ganglia bilaterally.  4. Moderate cerebral atrophy and chronic microvascular ischemic  changes.  MRA HEAD:  1. Mild multi focal irregularity within the cavernous ICAs  bilaterally, left greater than right, likely related to underlying  atherosclerotic disease. No high-grade flow-limiting stenosis or  proximal branch arterial occlusion identified.  2. No intracranial aneurysm.  2/13 ECHO - Left ventricle: The cavity size was normal. Systolic function was normal. The estimated ejection fraction was in the range of 60% to 65%. Wall motion was normal; there were no regional wall motion abnormalities. - Left atrium: The atrium was moderately dilated. - Atrial septum: No defect or patent foramen ovale  was identified.  Nobie Putnam, DO 09/17/2013, 12:10 PM PGY-1, [place] Intern pager: [phone], text pages welcome

## 2013-09-17 NOTE — Evaluation (Signed)
Speech Language Pathology Evaluation Patient Details Name: Brad Taylor MRN: [id number] DOB: [birth year] Today's Date: 09/17/2013 Time: 2119-4174 SLP Time Calculation (min): 16 min  Problem List:  Patient Active Problem List   Diagnosis Date Noted  . Stroke 09/15/2013   Past Medical History:  Past Medical History  Diagnosis Date  . COPD (chronic obstructive pulmonary disease)   . Diabetes mellitus without complication   . Hypothyroidism   . GERD (gastroesophageal reflux disease)   . Ulcer   . Post traumatic stress disorder   . Depression   . Hyperlipidemia    Past Surgical History:  Past Surgical History  Procedure Laterality Date  . Cholecystectomy    . Cataract extraction    . Knee surgery     HPI:  63 y.o. male admitted to [facility] on 09/15/13 with past medical history of COPD, insulin-dependent type 2 diabetes, hypothyroidism, depression, PTSD, and hyperlipidemia presents with right upper extremity weakness, right facial droop, and dysarthria.  CT negative. MRI reveals Acute ischemic infarct involving the posterior limb of the left internal capsule.  Additional subcentimeter ischemic infarct within the deep white matter of the left centrum semi ovale.  Remote subcentimeter lacunar infarcts involving the bilateral basal ganglia bilaterally.    Assessment / Plan / Recommendation Clinical Impression  Pt presents with a moderate unilateral UMN dysarthria as well as a mild aphasia impacting expression/comprehension of more novel, complex verbal information.  Pt noted to be coughing intermittently with secretions; he passed RN stroke swallow screen.  I asked his wife and RN to observe if he is coughing with evening meal.  If so, please order SLP swallow evaluation.  Recommend f/u for speech and language CIR.     SLP Assessment  Patient needs continued Speech Lanaguage Pathology Services    Follow Up Recommendations  Inpatient Rehab    Frequency and Duration min 2x/week  1 week   SLP Goals Potential to Achieve Goals: Good  SLP Evaluation Prior Functioning  Cognitive/Linguistic Baseline: Within functional limits Type of Home: House  Lives With: Spouse Available Help at Discharge: Family;Available 24 hours/day   Cognition  Overall Cognitive Status: Impaired/Different from baseline Arousal/Alertness: Awake/alert Orientation Level: Oriented X4    Comprehension  Auditory Comprehension Overall Auditory Comprehension: Impaired Yes/No Questions: Within Functional Limits Commands: Within Functional Limits Conversation: Complex Other Conversation Comments: decreased comprehension complex, novel Pharmacist, hospital Discrimination: Within Function Limits Reading Comprehension Reading Status: Not tested    Expression Expression Primary Mode of Expression: Verbal Verbal Expression Overall Verbal Expression: Impaired Initiation: No impairment Level of Generative/Spontaneous Verbalization: Conversation Repetition: No impairment Naming: Impairment Responsive: 76-100% accurate Confrontation: Impaired Convergent: 75-100% accurate Divergent:  (names six animals in 60 seconds) Pragmatics: No impairment Written Expression Dominant Hand: Right Written Expression: Not tested   Oral / Motor Oral Motor/Sensory Function Overall Oral Motor/Sensory Function:  (impaired CN VII, XII on right) Motor Speech Overall Motor Speech: Impaired Respiration: Impaired Level of Impairment: Phrase Resonance: Hypernasality Articulation: Impaired Level of Impairment: Phrase Intelligibility: Intelligibility reduced Word: 50-74% accurate Phrase: 50-74% accurate Sentence: 50-74% accurate Conversation: 50-74% accurate   Alazae Crymes L. Tivis Ringer, [place] CCC/SLP Pager [phone]      Juan Quam Laurice 09/17/2013, 2:55 PM

## 2013-09-17 NOTE — Progress Notes (Addendum)
Stroke Team Progress Note  HISTORY Brad Taylor is a 63 y.o. male who awakened 09/15/2013 and was not feeling well. His blood sugar was elevated at 180 and he decided to go back to sleep. When he awakened later he noted that he was stumbling as he tried to get to the bathroom so after using the bathroom he went back to bed. When he got out of bed later he was having some slurred speech and was complaining that his right arm did not feel right. He thought that he slept on it wrong. When his son came home at 5:30 he noted that his face was drooping, speech was very slurred and he could not use his right arm well. EMS was called at that time and the patient was brought in as a code stroke.   Date last known well: Date: 09/15/2013  Time last known well: Time: 14:00  tPA Given: No: Outside time window   SUBJECTIVE Multiple family members present. We discussed the plan for the patient to go to the inpatient rehabilitation facility. He is anxious to go home; however, the patient's family wants him to go to the rehabilitation unit to make as much progress as possible prior to discharge. The patient agrees.  OBJECTIVE Most recent Vital Signs: Filed Vitals:   09/16/13 1433 09/16/13 2303 09/17/13 0010 09/17/13 0400  BP: 159/92 147/86 120/67 152/81  Pulse: 60 62 61 62  Temp: 97.7 F (36.5 C) 97.7 F (36.5 C) 98.1 F (36.7 C) 97.6 F (36.4 C)  TempSrc: Oral Oral Oral Oral  Resp: 19 20 20 18   Height:      Weight:    122.9 kg (270 lb 15.1 oz)  SpO2: 97% 95% 92% 96%   CBG (last 3)   Recent Labs  09/16/13 1639 09/16/13 2249 09/17/13 0743  GLUCAP 118* 168* 147*    IV Fluid Intake:     MEDICATIONS  . aspirin EC  81 mg Oral Daily  . atorvastatin  80 mg Oral q1800  . buPROPion  150 mg Oral Daily  . clopidogrel  75 mg Oral Q breakfast  . divalproex  1,000 mg Oral QHS  . enoxaparin (LOVENOX) injection  40 mg Subcutaneous Q24H  . insulin aspart  0-15 Units Subcutaneous TID WC  .  levothyroxine  125 mcg Oral QAC breakfast  . lisinopril  10 mg Oral Daily  . pantoprazole  40 mg Oral Daily  . sodium chloride  3 mL Intravenous Q12H  . traZODone  300 mg Oral QHS  . venlafaxine XR  150 mg Oral BID   PRN:  busPIRone, sodium chloride  Diet:  Carb Control thin liquids Activity:  Up with assistance DVT Prophylaxis:  Lovenox  CLINICALLY SIGNIFICANT STUDIES Basic Metabolic Panel:  Recent Labs Lab 09/15/13 1833 09/15/13 1845  NA 137 135*  K 5.0 4.6  CL 95* 95*  CO2 30  --   GLUCOSE 125* 124*  BUN 10 9  CREATININE 0.83 1.00  CALCIUM 9.2  --    Liver Function Tests:  Recent Labs Lab 09/15/13 1833  AST 13  ALT 12  ALKPHOS 64  BILITOT 0.3  PROT 7.0  ALBUMIN 3.6   CBC:  Recent Labs Lab 09/15/13 1833 09/15/13 1845  WBC 10.2  --   NEUTROABS 4.8  --   HGB 15.9 17.0  HCT 45.3 50.0  MCV 90.2  --   PLT 175  --    Coagulation:  Recent Labs Lab 09/15/13 1833  LABPROT  13.3  INR 1.03   Cardiac Enzymes:  Recent Labs Lab 09/15/13 1833  TROPONINI <0.30   Urinalysis:  Recent Labs Lab 09/16/13 0642  COLORURINE YELLOW  LABSPEC 1.021  PHURINE 7.5  GLUCOSEU NEGATIVE  HGBUR NEGATIVE  BILIRUBINUR LARGE*  KETONESUR NEGATIVE  PROTEINUR NEGATIVE  UROBILINOGEN 1.0  NITRITE NEGATIVE  LEUKOCYTESUR NEGATIVE   Lipid Panel    Component Value Date/Time   CHOL 216* 09/16/2013 0415   TRIG 288* 09/16/2013 0415   HDL 35* 09/16/2013 0415   CHOLHDL 6.2 09/16/2013 0415   VLDL 58* 09/16/2013 0415   LDLCALC 123* 09/16/2013 0415   HgbA1C  Lab Results  Component Value Date   HGBA1C 7.4* 09/16/2013    Urine Drug Screen:     Component Value Date/Time   LABOPIA NONE DETECTED 09/16/2013 0642   COCAINSCRNUR NONE DETECTED 09/16/2013 0642   LABBENZ NONE DETECTED 09/16/2013 0642   AMPHETMU NONE DETECTED 09/16/2013 0642   THCU NONE DETECTED 09/16/2013 0642   LABBARB NONE DETECTED 09/16/2013 0642    Alcohol Level:  Recent Labs Lab 09/15/13 1833  ETH <11    Ct Head  Wo Contrast 09/15/2013    No focal acute intracranial abnormality identified. Chronic diffuse atrophy. Chronic bilateral periventricular white matter small vessel ischemic change.   Mr Brain Wo Contrast 09/17/2013    MRI HEAD:   1. Acute ischemic infarct involving the posterior limb of the left internal capsule. 2. Additional subcentimeter ischemic infarct within the deep white matter of the left centrum semi ovale. 3. Remote subcentimeter lacunar infarcts involving the bilateral basal ganglia bilaterally. 4. Moderate cerebral atrophy and chronic microvascular ischemic changes.    MRA HEAD:   1. Mild multi focal irregularity within the cavernous ICAs bilaterally, left greater than right, likely related to underlying atherosclerotic disease. No high-grade flow-limiting stenosis or proximal branch arterial occlusion identified. 2. No intracranial aneurysm.     Dg Chest Port 1 View 09/15/2013    Negative except for mild cardiac enlargement       2D Echocardiogram  ejection fraction 60-65%. No cardiac source of emboli was identified.  Carotid Doppler  pending   EKG sinus bradycardia rate 56 beats per minute. For complete results please see formal report.   Therapy Recommendations - inpatient rehabilitation recommended. Rehabilitation M.D. has seen patient and agrees.  Physical Exam   Neurologic Examination:  Mental Status:  Alert, oriented, thought content appropriate. Speech nonfluent. At times words can not be distinguished. Speech is slurred. Able to follow 3 step commands without difficulty.  Cranial Nerves:  II: Discs flat bilaterally; Visual fields grossly normal, pupils equal, round, reactive to light and accommodation  III,IV, VI: ptosis not present, extra-ocular motions intact bilaterally  V,VII: mild right facial droop, facial light touch sensation normal bilaterally  VIII: hearing normal bilaterally  IX,X: gag reflex present  XI: bilateral shoulder shrug  XII: midline tongue  extension  Motor:  Right : Upper extremity 3/5 Left: Upper extremity 5/5  Lower extremity 5/5 Lower extremity 5/5  Tone and bulk:normal tone throughout; no atrophy noted  Sensory: Pinprick and light touch decreased on the RUE  Deep Tendon Reflexes: 2+ and symmetric with absent AJ's bilaterally  Plantars:  Right: downgoing Left: downgoing  Cerebellar:  normal finger-to-nose and normal heel-to-shin test. Difficult to perform on the right secondary to weakness  Gait: Unable to test safely  CV: pulses palpable throughout    ASSESSMENT Mr. Brad Taylor is a 63 y.o. male presenting with right upper extremity  weakness and speech difficulties. TPA was not given secondary to late presentation. MRI -acute ischemic infarct involving the posterior limb of the left internal capsule. 2. Additional subcentimeter ischemic infarct within the deep white matter of the left centrum semi ovale. 3. Remote subcentimeter lacunar infarcts involving the bilateral basal ganglia bilaterally. Infarcts felt to be embolic secondary to an unknown source.  On aspirin 81 mg orally every day prior to admission. Now on aspirin 81 mg orally every day and clopidogrel 75 mg orally every day for secondary stroke prevention. Patient with resultant right upper extremity paresis with speech difficulties. Work up underway.   Hyperlipidemia - cholesterol 216 LDL 123 - Crestor 20 mg daily prior to admission - now on Lipitor 80 mg daily  Diabetes mellitus - hemoglobin A1c 7.4  Hypertension  Irregular heart rate in the emergency department ( frequent PVCs noted on telemetry )   Hospital day # 2  TREATMENT/PLAN  Continue aspirin 81 mg orally every day and clopidogrel 75 mg orally every day for secondary stroke prevention.  Inpatient rehabilitation plan  Await carotid Dopplers  May need TEE and loop recorder  Mikey Bussing PA-C [facility] Pager [phone] 09/17/2013, 11:58 AM  I have personally  obtained a history, examined the patient, evaluated imaging results, and formulated the assessment and plan of care. I agree with the above.  Jim Like, DO Neurology-Stroke

## 2013-09-17 NOTE — Evaluation (Signed)
Occupational Therapy Evaluation Patient Details Name: Brad Taylor MRN: [id number] DOB: 12-24-1950 Today's Date: 09/17/2013 Time: 1324-4010 OT Time Calculation (min): 19 min  OT Assessment / Plan / Recommendation History of present illness 63 y.o. male admitted to [facility] on 09/15/13 with past medical history of COPD, insulin-dependent type 2 diabetes, hypothyroidism, depression, PTSD, and hyperlipidemia presents with right upper extremity weakness, right facial droop, and dysarthria.  CT negative. MRI reveals Acute ischemic infarct involving the posterior limb of the left internal capsule.  Additional subcentimeter ischemic infarct within the deep white matter of the left centrum semi ovale.  Remote subcentimeter lacunar infarcts involving the bilateral basal ganglia bilaterally.   Clinical Impression   Pt admitted with above.  Pt will continue to benefit from acute OT services to address below problem list. Recommending CIR to further progress rehab and maximize independence with ADLs prior to return home.    OT Assessment  Patient needs continued OT Services    Follow Up Recommendations  CIR    Barriers to Discharge      Equipment Recommendations   (TBD next venue)    Recommendations for Other Services Rehab consult  Frequency  Min 3X/week    Precautions / Restrictions Precautions Precautions: Fall Precaution Comments: unsteady on his feet   Pertinent Vitals/Pain See vitals    ADL  Grooming: Performed;Wash/dry face;Supervision/safety (using Left hand) Where Assessed - Grooming: Unsupported sitting Upper Body Bathing: Simulated;Minimal assistance Where Assessed - Upper Body Bathing: Unsupported sitting Lower Body Bathing: Simulated;Moderate assistance Where Assessed - Lower Body Bathing: Unsupported sitting Upper Body Dressing: Performed;Moderate assistance Where Assessed - Upper Body Dressing: Unsupported sitting Lower Body Dressing: Performed;Maximal assistance (donned  socks) Where Assessed - Lower Body Dressing: Unsupported sitting ADL Comments: Pt sat EOB >10 min. at supervision level.  Educated pt and wife on repeatedly attempting to use RUE when reaching for objects such as to touch wife's hand.      OT Diagnosis: Generalized weakness;Cognitive deficits;Disturbance of vision;Paresis  OT Problem List: Decreased activity tolerance;Impaired balance (sitting and/or standing);Decreased range of motion;Decreased strength;Impaired vision/perception;Decreased coordination;Decreased cognition;Decreased safety awareness;Decreased knowledge of use of DME or AE;Impaired UE functional use;Impaired sensation OT Treatment Interventions: Self-care/ADL training;Therapeutic exercise;Neuromuscular education;DME and/or AE instruction;Therapeutic activities;Cognitive remediation/compensation;Visual/perceptual remediation/compensation;Patient/family education;Balance training   OT Goals(Current goals can be found in the care plan section) Acute Rehab OT Goals Patient Stated Goal: to go home OT Goal Formulation: With patient/family Time For Goal Achievement: 10/01/13 Potential to Achieve Goals: Good  Visit Information  Last OT Received On: 09/17/13 Assistance Needed: +1 (EOB level) History of Present Illness: 63 y.o. male admitted to [facility] on 09/15/13 with past medical history of COPD, insulin-dependent type 2 diabetes, hypothyroidism, depression, PTSD, and hyperlipidemia presents with right upper extremity weakness, right facial droop, and dysarthria.  CT negative. MRI reveals Acute ischemic infarct involving the posterior limb of the left internal capsule.  Additional subcentimeter ischemic infarct within the deep white matter of the left centrum semi ovale.  Remote subcentimeter lacunar infarcts involving the bilateral basal ganglia bilaterally.       Prior [place] expects to be discharged to:: Private residence Living Arrangements:  Spouse/significant other Available Help at Discharge: Family;Available 24 hours/day Type of Home: House Home Access: Stairs to enter [employer] of Steps: 3 Entrance Stairs-Rails: Right Home Layout: One level Home Equipment: Cane - single point Prior Function Level of Independence: Independent;Independent with assistive device(s) Comments: Pt uses cane for distance walking PTA.  Communication Communication: Expressive difficulties Dominant Hand: Right         Vision/Perception Vision - History Visual History: Cataracts Patient Visual Report: No change from baseline Vision - Assessment Vision Assessment: Vision tested Tracking/Visual Pursuits: Impaired - to be further tested in functional context Additional Comments: Pt recently had cataract removal sx (~1 month ago). Attempted to test pt's vision.  Instructed pt to track therapist pen.  Pt would track in a direction and when therapist changed direction of pen he would not track with his eyes but still state that he was looking directly at it.  Unsure if there is a visual deficit vs pt having difficulty following therapist instructions.  Will continue to assess.  Demo'd good awareness of right side and to therapist who sat on his right side during session.   Cognition  Cognition Arousal/Alertness: Awake/alert Behavior During Therapy: WFL for tasks assessed/performed Overall Cognitive Status: Impaired/Different from baseline Area of Impairment: Safety/judgement Safety/Judgement: Decreased awareness of safety;Decreased awareness of deficits Awareness: Emergent General Comments: Pt knows he is unsteady on his feet, but cannot anticipate how to adjust what he is doing to be safer becaus of it (i.e. walking slower).   Difficult to assess due to: Impaired communication    Extremity/Trunk Assessment Upper Extremity Assessment Upper Extremity Assessment: RUE deficits/detail RUE Deficits / Details: 3-/5 in hand, wrist and  elbow.  2+/5 shoulder. Incr time to complete ROM. RUE Sensation: decreased proprioception RUE Coordination: decreased fine motor;decreased gross motor     Mobility Bed Mobility Overal bed mobility: Needs Assistance Bed Mobility: Supine to Sit Supine to sit: Mod assist General bed mobility comments: Heavy reliance on bed rail. Assist with draw pad to pivot hips around toward EOB. Transfers Overall transfer level: Needs assistance Equipment used: Straight cane       Exercise     Balance Balance  Sitting-balance support: Single extremity supported Sitting balance-Leahy Scale: Good    End of Session OT - End of Session Activity Tolerance: Patient tolerated treatment well Patient left: in bed;with call bell/phone within reach;with family/visitor present Nurse Communication: Mobility status  GO    09/17/2013 Darrol Jump OTR/L Pager [phone] Office [phone]  Darrol Jump 09/17/2013, 2:02 PM

## 2013-09-17 NOTE — Consult Note (Signed)
Physical Medicine and Rehabilitation Consult Reason for Consult: CVA Referring Physician: [facility]   HPI: Brad Taylor is a 63 y.o. male with history of COPD, diabetes mellitus and peripheral neuropathy, PTSD. Admitted 09/15/2013 with right-sided weakness and slurred speech. MRI of the brain showed acute ischemic infarct involving the posterior limb of the left internal capsule as well as additional subcentimeter ischemic infarct deep white matter of the left centrum semiovale and remote lacunar infarct bilateral basal ganglia. MRA of the head with no stenosis or aneurysm. Echocardiogram is pending. Carotid Dopplers with no ICA stenosis. Patient did not receive TPA. Neurology services consulted placed on aspirin for CVA prophylaxis as well as subcutaneous Lovenox for DVT prophylaxis. Maintained on a regular consistency diet. Physical therapy evaluation completed 09/16/2013 with recommendations of physical medicine rehabilitation consult to consider inpatient rehabilitation services.  Review of Systems  Gastrointestinal: Positive for constipation.       GERD  Musculoskeletal: Positive for myalgias.  Psychiatric/Behavioral: Positive for depression.       PTSD  All other systems reviewed and are negative.   Past Medical History  Diagnosis Date  . COPD (chronic obstructive pulmonary disease)   . Diabetes mellitus without complication   . Hypothyroidism   . GERD (gastroesophageal reflux disease)   . Ulcer   . Post traumatic stress disorder   . Depression   . Hyperlipidemia    Past Surgical History  Procedure Laterality Date  . Cholecystectomy    . Cataract extraction    . Knee surgery     History reviewed. No pertinent family history. Social History:  reports that he has been smoking Cigarettes.  He has been smoking about 1.00 pack per day. He quit smokeless tobacco use about 4 months ago. He reports that he does not drink alcohol or use illicit drugs. Allergies: Not on  File Medications Prior to Admission  Medication Sig Dispense Refill  . aspirin 81 MG tablet Take 81 mg by mouth daily.      Marland Kitchen buPROPion (WELLBUTRIN SR) 150 MG 12 hr tablet Take 150 mg by mouth daily.      . busPIRone (BUSPAR) 10 MG tablet Take 5 mg by mouth 2 (two) times daily as needed (for anxiety).      Marland Kitchen divalproex (DEPAKOTE) 500 MG DR tablet Take 1,000 mg by mouth at bedtime.       Marland Kitchen etodolac (LODINE) 400 MG tablet Take 400 mg by mouth 2 (two) times daily.      Marland Kitchen glipiZIDE (GLUCOTROL) 5 MG tablet Take 10 mg by mouth daily before breakfast.       . insulin glargine (LANTUS) 100 UNIT/ML injection Inject 60-65 Units into the skin 2 (two) times daily. 60 units in am, 65 units in pm      . levothyroxine (SYNTHROID, LEVOTHROID) 125 MCG tablet Take 125 mcg by mouth daily before breakfast.      . RABEprazole (ACIPHEX) 20 MG tablet Take 40 mg by mouth daily before breakfast.       . rosuvastatin (CRESTOR) 20 MG tablet Take 10 mg by mouth at bedtime.      Cathie Olden Nitrate SOLN 1 application by Does not apply route every other day.      . terazosin (HYTRIN) 10 MG capsule Take 10 mg by mouth at bedtime.      . traZODone (DESYREL) 100 MG tablet Take 300 mg by mouth at bedtime.       Marland Kitchen venlafaxine XR (EFFEXOR-XR) 150  MG 24 hr capsule Take 150 mg by mouth 2 (two) times daily.        Home: Home Living Family/patient expects to be discharged to:: Private residence Living Arrangements: Spouse/significant other Available Help at Discharge: Family;Available 24 hours/day Type of Home: House Home Access: Stairs to enter [employer] of Steps: 3 Entrance Stairs-Rails: Right Home Layout: One level Home Equipment: Cane - single point  Functional History: Prior Function Comments: Pt uses cane for distance walking PTA.   Functional Status:  Mobility:     Ambulation/Gait Ambulation Distance (Feet): 200 Feet Gait velocity: decreased General Gait Details: Pt with staggering gait pattern,  increased LOB when turning to look at something or turning corners.  Signs of decreased coordination of his right leg during gait.     ADL:    Cognition: Cognition Overall Cognitive Status: Difficult to assess Orientation Level: Oriented X4 Cognition Arousal/Alertness: Awake/alert Behavior During Therapy: WFL for tasks assessed/performed Overall Cognitive Status: Difficult to assess Difficult to assess due to: Impaired communication  Blood pressure 152/81, pulse 62, temperature 97.6 F (36.4 C), temperature source Oral, resp. rate 18, height 6' (1.829 m), weight 122.9 kg (270 lb 15.1 oz), SpO2 96.00%. Physical Exam  Vitals reviewed. Constitutional:  63 year old obese male  Eyes: EOM are normal.  Neck: Normal range of motion. Neck supple. No thyromegaly present.  Cardiovascular: Normal rate and regular rhythm.   Respiratory:  Lungs decreased breath sounds at the bases but clear to auscultation  GI: Soft. Bowel sounds are normal. He exhibits no distension. There is no tenderness. There is no rebound.  Neurological: He is alert.  Speech is dysarthric but intelligible. He was able to name person, date of birth needing some cueing for place. RUE is 2- deltoid, bicep, 2+ to 3 with tricep, wrist, 2+ hand. RLE is 3 to 3+/5. Sensory exam intact to PP and LT on the right. Right facial droop, and tongue deviation. A little delayed in processing at times. Distracted. Generally cooperative  Skin: Skin is warm and dry.  Psychiatric: He has a normal mood and affect.    Results for orders placed during the hospital encounter of 09/15/13 (from the past 24 hour(s))  GLUCOSE, CAPILLARY     Status: Abnormal   Collection Time    09/16/13  8:56 AM      Result Value Ref Range   Glucose-Capillary 143 (*) 70 - 99 mg/dL   Comment 1 Documented in Chart     Comment 2 Notify RN    GLUCOSE, CAPILLARY     Status: Abnormal   Collection Time    09/16/13 11:36 AM      Result Value Ref Range    Glucose-Capillary 142 (*) 70 - 99 mg/dL  GLUCOSE, CAPILLARY     Status: Abnormal   Collection Time    09/16/13  4:39 PM      Result Value Ref Range   Glucose-Capillary 118 (*) 70 - 99 mg/dL  GLUCOSE, CAPILLARY     Status: Abnormal   Collection Time    09/16/13 10:49 PM      Result Value Ref Range   Glucose-Capillary 168 (*) 70 - 99 mg/dL   Ct Head Wo Contrast  09/15/2013   CLINICAL DATA:  Right-sided weakness and slurred speech  EXAM: CT HEAD WITHOUT CONTRAST  TECHNIQUE: Contiguous axial images were obtained from the base of the skull through the vertex without intravenous contrast.  COMPARISON:  None.  FINDINGS: There is chronic diffuse atrophy. Chronic  bilateral periventricular white matter small vessel ischemic changes noted. There is no midline shift, hydrocephalus, or mass. No acute hemorrhage or acute transcortical infarct is identified. The bony calvarium is intact. There is mucoperiosteal thickening of bilateral ethmoid and bilateral maxillary sinuses. Air-fluid level is identified in the left maxillary sinus consistent with acute sinusitis.  IMPRESSION: No focal acute intracranial abnormality identified. Chronic diffuse atrophy. Chronic bilateral periventricular white matter small vessel ischemic change.  I attempted to call Dr. Alexis Goodell with Critical Value/emergent results at the time of interpretation on 09/15/2013 at 6:51 PM but could not reach her.   Electronically Signed   By: Abelardo Diesel M.D.   On: 09/15/2013 18:57   Mr Brain Wo Contrast  09/17/2013   CLINICAL DATA:  Right facial droop, slurred speech  EXAM: MRI HEAD WITHOUT CONTRAST  MRA HEAD WITHOUT CONTRAST  TECHNIQUE: Multiplanar, multiecho pulse sequences of the brain and surrounding structures were obtained without intravenous contrast. Angiographic images of the head were obtained using MRA technique without contrast.  COMPARISON:  Prior CT from 09/15/2013.  FINDINGS: MRI HEAD FINDINGS  Diffuse prominence of the CSF  containing spaces is compatible with generalized age-related atrophy, moderate for patient age. Scattered and confluent T2/FLAIR hyperintensity within the periventricular and deep white matter most consistent with chronic microvascular ischemic changes. Small remote lacunar infarcts involving the bilateral basal ganglia are noted. Remote lacunar infarct within the right periatrial white matter are also noted.  No mass lesion, midline shift, or extra-axial fluid collection. Ventricles are normal in size without evidence of hydrocephalus.  There is an area of abnormal restricted diffusion measuring 1.4 x 0.8 cm within the posterior limb of the left internal capsule (series 11, image 17). An additional small focus of restricted diffusion measuring approximately 3 mm seen within the deep white matter of the left centrum semi ovale in the left frontoparietal region (series 11, image 22). Corresponding signal dropout on seen on ADC map. Findings are compatible with acute ischemic infarcts. No intracranial hemorrhage identified.  The cervicomedullary junction is normal. Pituitary gland is within normal limits. Pituitary stalk is midline. The globes and optic nerves demonstrate a normal appearance with normal signal intensity. The  The bone marrow signal intensity is normal. Calvarium is intact. Visualized upper cervical spine is within normal limits.  Scalp soft tissues are unremarkable.  Mild mucoperiosteal thickening present within the maxillary sinuses bilaterally, left greater than right. Scattered fluid density seen within the ethmoidal air cells bilaterally. No mastoid effusion.  MRA HEAD FINDINGS  The visualized distal cervical segments of the internal carotid arteries are widely patent bilaterally with normal antegrade flow. The petrous segments are widely patent bilaterally. Mild multi focal irregularity within the cavernous segments of the internal carotid arteries bilaterally noted, left greater than right.  Finding is likely related to underlying atherosclerotic disease. No high-grade flow-limiting stenosis identified. The supra clinoid segments are within normal limits bilaterally. The A1 segments, anterior communicating artery, and anterior cerebral arteries are within normal limits. The M1 branches are symmetric bilaterally without evidence of high-grade stenosis or proximal branch occlusion. Distal MCA branches are symmetric and well opacified bilaterally. No aneurysm seen within the anterior circulation.  The vertebral arteries are codominant. Posterior inferior cerebellar arteries are normal. Vertebrobasilar junction, and basilar artery are within normal limits. No basilar tip stenosis or aneurysm. Posterior cerebral arteries are symmetric and well opacified bilaterally with antegrade flow. Superior cerebellar and anterior inferior cerebellar arteries are within normal limits. No aneurysm seen within the  posterior circulation.  IMPRESSION: MRI HEAD:  1. Acute ischemic infarct involving the posterior limb of the left internal capsule. 2. Additional subcentimeter ischemic infarct within the deep white matter of the left centrum semi ovale. 3. Remote subcentimeter lacunar infarcts involving the bilateral basal ganglia bilaterally. 4. Moderate cerebral atrophy and chronic microvascular ischemic changes.  MRA HEAD:  1. Mild multi focal irregularity within the cavernous ICAs bilaterally, left greater than right, likely related to underlying atherosclerotic disease. No high-grade flow-limiting stenosis or proximal branch arterial occlusion identified. 2. No intracranial aneurysm.   Electronically Signed   By: Jeannine Boga M.D.   On: 09/17/2013 02:05   Dg Chest Port 1 View  09/15/2013   CLINICAL DATA:  Code stroke, smoker  EXAM: PORTABLE CHEST - 1 VIEW  COMPARISON:  None.  FINDINGS: Mild cardiac enlargement.  Vascular pattern normal.  Lungs clear.  IMPRESSION: Negative except for mild cardiac enlargement    Electronically Signed   By: Skipper Cliche M.D.   On: 09/15/2013 19:32   Mr Jodene Nam Head/brain Wo Cm  09/17/2013   CLINICAL DATA:  Right facial droop, slurred speech  EXAM: MRI HEAD WITHOUT CONTRAST  MRA HEAD WITHOUT CONTRAST  TECHNIQUE: Multiplanar, multiecho pulse sequences of the brain and surrounding structures were obtained without intravenous contrast. Angiographic images of the head were obtained using MRA technique without contrast.  COMPARISON:  Prior CT from 09/15/2013.  FINDINGS: MRI HEAD FINDINGS  Diffuse prominence of the CSF containing spaces is compatible with generalized age-related atrophy, moderate for patient age. Scattered and confluent T2/FLAIR hyperintensity within the periventricular and deep white matter most consistent with chronic microvascular ischemic changes. Small remote lacunar infarcts involving the bilateral basal ganglia are noted. Remote lacunar infarct within the right periatrial white matter are also noted.  No mass lesion, midline shift, or extra-axial fluid collection. Ventricles are normal in size without evidence of hydrocephalus.  There is an area of abnormal restricted diffusion measuring 1.4 x 0.8 cm within the posterior limb of the left internal capsule (series 11, image 17). An additional small focus of restricted diffusion measuring approximately 3 mm seen within the deep white matter of the left centrum semi ovale in the left frontoparietal region (series 11, image 22). Corresponding signal dropout on seen on ADC map. Findings are compatible with acute ischemic infarcts. No intracranial hemorrhage identified.  The cervicomedullary junction is normal. Pituitary gland is within normal limits. Pituitary stalk is midline. The globes and optic nerves demonstrate a normal appearance with normal signal intensity. The  The bone marrow signal intensity is normal. Calvarium is intact. Visualized upper cervical spine is within normal limits.  Scalp soft tissues are unremarkable.   Mild mucoperiosteal thickening present within the maxillary sinuses bilaterally, left greater than right. Scattered fluid density seen within the ethmoidal air cells bilaterally. No mastoid effusion.  MRA HEAD FINDINGS  The visualized distal cervical segments of the internal carotid arteries are widely patent bilaterally with normal antegrade flow. The petrous segments are widely patent bilaterally. Mild multi focal irregularity within the cavernous segments of the internal carotid arteries bilaterally noted, left greater than right. Finding is likely related to underlying atherosclerotic disease. No high-grade flow-limiting stenosis identified. The supra clinoid segments are within normal limits bilaterally. The A1 segments, anterior communicating artery, and anterior cerebral arteries are within normal limits. The M1 branches are symmetric bilaterally without evidence of high-grade stenosis or proximal branch occlusion. Distal MCA branches are symmetric and well opacified bilaterally. No aneurysm seen within the  anterior circulation.  The vertebral arteries are codominant. Posterior inferior cerebellar arteries are normal. Vertebrobasilar junction, and basilar artery are within normal limits. No basilar tip stenosis or aneurysm. Posterior cerebral arteries are symmetric and well opacified bilaterally with antegrade flow. Superior cerebellar and anterior inferior cerebellar arteries are within normal limits. No aneurysm seen within the posterior circulation.  IMPRESSION: MRI HEAD:  1. Acute ischemic infarct involving the posterior limb of the left internal capsule. 2. Additional subcentimeter ischemic infarct within the deep white matter of the left centrum semi ovale. 3. Remote subcentimeter lacunar infarcts involving the bilateral basal ganglia bilaterally. 4. Moderate cerebral atrophy and chronic microvascular ischemic changes.  MRA HEAD:  1. Mild multi focal irregularity within the cavernous ICAs bilaterally,  left greater than right, likely related to underlying atherosclerotic disease. No high-grade flow-limiting stenosis or proximal branch arterial occlusion identified. 2. No intracranial aneurysm.   Electronically Signed   By: Jeannine Boga M.D.   On: 09/17/2013 02:05    Assessment/Plan: Diagnosis: left internal capsule/deep white matter infarct with right hemiparesis 1. Does the need for close, 24 hr/day medical supervision in concert with the patient's rehab needs make it unreasonable for this patient to be served in a less intensive setting? Yes 2. Co-Morbidities requiring supervision/potential complications: htn, post-stroke sequelae 3. Due to bladder management, bowel management, safety, skin/wound care, disease management, medication administration and patient education, does the patient require 24 hr/day rehab nursing? Yes 4. Does the patient require coordinated care of a physician, rehab nurse, PT (1-2 hrs/day, 5 days/week), OT (1-2 hrs/day, 5 days/week) and SLP (1-2 hrs/day, 5 days/week) to address physical and functional deficits in the context of the above medical diagnosis(es)? Yes Addressing deficits in the following areas: balance, endurance, locomotion, strength, transferring, bowel/bladder control, bathing, dressing, feeding, grooming, toileting, speech, swallowing and psychosocial support 5. Can the patient actively participate in an intensive therapy program of at least 3 hrs of therapy per day at least 5 days per week? Yes 6. The potential for patient to make measurable gains while on inpatient rehab is excellent 7. Anticipated functional outcomes upon discharge from inpatient rehab are mod I with PT, mod I with OT, mod I with SLP. 8. Estimated rehab length of stay to reach the above functional goals is: 7-9 days 9. Does the patient have adequate social supports to accommodate these discharge functional goals? Yes 10. Anticipated D/C setting: Home 11. Anticipated post D/C  treatments: [place] therapy 12. Overall Rehab/Functional Prognosis: excellent  RECOMMENDATIONS: This patient's condition is appropriate for continued rehabilitative care in the following setting: CIR Patient has agreed to participate in recommended program. Yes Note that insurance prior authorization may be required for reimbursement for recommended care.  Comment: Rehab Admissions Coordinator to follow up.  Thanks,  Meredith Staggers, MD, Mellody Drown     09/17/2013

## 2013-09-17 NOTE — Progress Notes (Signed)
Physical Therapy Treatment Patient Details Name: Brad Taylor MRN: [id number] DOB: 10-07-1950 Today's Date: 09/17/2013 Time: 6144-3154 PT Time Calculation (min): 21 min  PT Assessment / Plan / Recommendation  History of Present Illness 63 y.o. male admitted to [facility] on 09/15/13 with past medical history of COPD, insulin-dependent type 2 diabetes, hypothyroidism, depression, PTSD, and hyperlipidemia presents with right upper extremity weakness, right facial droop, and dysarthria.  CT negative. MRI reveals Acute ischemic infarct involving the posterior limb of the left internal capsule.  Additional subcentimeter ischemic infarct within the deep white matter of the left centrum semi ovale.  Remote subcentimeter lacunar infarcts involving the bilateral basal ganglia bilaterally.   PT Comments   Pt continues to have significant balance deficits during our attempts at gait with an assistive device.  He is not ready to use a cane with only one person's assist.  We may try a RW with assist to keep his right hand on the RW next session.  Walking with the cane required two people for safety at pt at times cannot recover on his own when he loses his balance to the right.  With fatigue right leg weakness is more evident with decreased forward progression of the leg without cues and decreased foot clearance.  I explained to family that inpatient rehab level therapies, although requiring an overnight stay (pt wants to go home if he can) would be his best option to get more stable faster, and get the most therapy.  He is at high risk of falls and if he falls while his wife is helping him they could both be hurt.  Family to discuss options and PT will continue to follow the pt during his acute hospital stay.    Follow Up Recommendations  CIR     Does the patient have the potential to tolerate intense rehabilitation    Yes  Barriers to Discharge   high fall risk and wife being able to physically handle him by  herself.        Equipment Recommendations  Rolling walker with 5" wheels    Recommendations for Other Services   NA  Frequency Min 4X/week   Progress towards PT Goals Progress towards PT goals: Progressing toward goals  Plan Current plan remains appropriate    Precautions / Restrictions Precautions Precautions: Fall Precaution Comments: unsteady on his feet   Pertinent Vitals/Pain See vitals flow sheet.    Mobility  Bed Mobility Overal bed mobility: Needs Assistance Bed Mobility: Supine to Sit Supine to sit: Mod assist General bed mobility comments: mod hand held assist to pull to sitting EOB.  Wife providing assist and both pt and wife were straining and holding thier breath.  Educated both on why they need to breathe while doing something effortful.   Transfers Overall transfer level: Needs assistance Equipment used: Straight cane Transfers: Sit to/from Stand Sit to Stand: Mod assist;+2 physical assistance General transfer comment: two person mod assist to prevent posterior and right LOB when initially going to standing.  Ambulation/Gait Ambulation/Gait assistance: +2 safety/equipment Ambulation Distance (Feet): 200 Feet Assistive device: Straight cane Gait Pattern/deviations: Step-through pattern;Decreased step length - right;Decreased dorsiflexion - right;Steppage;Shuffle;Staggering right Gait velocity: decreased Gait velocity interpretation: <1.8 ft/sec, indicative of risk for recurrent falls General Gait Details: Pt with continued staggering gait pattern.  Son came with [place] for safety due to significant balance deficits during gait.  Pt is not safe to use cane without two people assisting him as he loses his balance and  has a tough time recovering from that LOB.  We will need to try RW next session, but he may need assist to keep his right hand on the RW due to decreased strength and coordination on that side.  Modified Rankin (Stroke Patients Only) Pre-Morbid Rankin Score:  No symptoms Modified Rankin: Moderately severe disability      PT Goals (current goals can now be found in the care plan section) Acute Rehab PT Goals Patient Stated Goal: to go home  Visit Information  Last PT Received On: 09/17/13 Assistance Needed: +1 History of Present Illness: 63 y.o. male admitted to [facility] on 09/15/13 with past medical history of COPD, insulin-dependent type 2 diabetes, hypothyroidism, depression, PTSD, and hyperlipidemia presents with right upper extremity weakness, right facial droop, and dysarthria.  CT negative. MRI reveals Acute ischemic infarct involving the posterior limb of the left internal capsule.  Additional subcentimeter ischemic infarct within the deep white matter of the left centrum semi ovale.  Remote subcentimeter lacunar infarcts involving the bilateral basal ganglia bilaterally.    Subjective Data  Subjective: Pt continues to want to go home Patient Stated Goal: to go home   Cognition  Cognition Arousal/Alertness: Awake/alert Behavior During Therapy: WFL for tasks assessed/performed Overall Cognitive Status: Impaired/Different from baseline Area of Impairment: Safety/judgement;Awareness Safety/Judgement: Decreased awareness of deficits;Decreased awareness of safety Awareness: Emergent General Comments: Pt knows he is unsteady on his feet, but cannot anticipate how to adjust what he is doing to be safer becaus of it (i.e. walking slower).      Balance  Balance Overall balance assessment: Needs assistance Sitting-balance support: Single extremity supported;Feet supported Sitting balance-Leahy Scale: Good Standing balance support: Single extremity supported Standing balance-Leahy Scale: Poor General Comments General comments (skin integrity, edema, etc.): Pt's speech seems better today  End of Session PT - End of Session Equipment Utilized During Treatment: Gait belt Activity Tolerance: Patient limited by fatigue Patient left: in chair;with  call bell/phone within reach;with family/visitor present    Wells Guiles B. [place], [place], DPT [phone]   09/17/2013, 12:59 PM

## 2013-09-18 DIAGNOSIS — I6350 Cerebral infarction due to unspecified occlusion or stenosis of unspecified cerebral artery: Principal | ICD-10-CM

## 2013-09-18 LAB — GLUCOSE, CAPILLARY
Glucose-Capillary: 167 mg/dL — ABNORMAL HIGH (ref 70–99)
Glucose-Capillary: 181 mg/dL — ABNORMAL HIGH (ref 70–99)
Glucose-Capillary: 219 mg/dL — ABNORMAL HIGH (ref 70–99)
Glucose-Capillary: 174 mg/dL — ABNORMAL HIGH (ref 70–99)

## 2013-09-18 MED ORDER — BACITRACIN ZINC 500 UNIT/GM EX OINT
TOPICAL_OINTMENT | Freq: Once | CUTANEOUS | Status: AC | PRN
  Administered 2013-09-18: 14:00:00 via TOPICAL
  Filled 2013-09-18: qty 28.35
  Filled 2013-09-18: qty 15

## 2013-09-18 NOTE — Evaluation (Signed)
Clinical/Bedside Swallow Evaluation Patient Details  Name: Brad Taylor MRN: [id number] Date of Birth: Dec 26, 1950  Today's Date: 09/18/2013 Time:  -     Past Medical History:  Past Medical History  Diagnosis Date  . COPD (chronic obstructive pulmonary disease)   . Diabetes mellitus without complication   . Hypothyroidism   . GERD (gastroesophageal reflux disease)   . Ulcer   . Post traumatic stress disorder   . Depression   . Hyperlipidemia    Past Surgical History:  Past Surgical History  Procedure Laterality Date  . Cholecystectomy    . Cataract extraction    . Knee surgery     HPI:  63 y.o. male admitted to [facility] on 09/15/13 with past medical history of COPD, insulin-dependent type 2 diabetes, hypothyroidism, depression, PTSD, and hyperlipidemia presents with right upper extremity weakness, right facial droop, and dysarthria.  CT negative. MRI reveals Acute ischemic infarct involving the posterior limb of the left internal capsule.  Additional subcentimeter ischemic infarct within the deep white matter of the left centrum semi ovale.  Remote subcentimeter lacunar infarcts involving the bilateral basal ganglia bilaterally.    Assessment / Plan / Recommendation Clinical Impression  Pt presents with no overt evidence of aspiration with thin liquids. He tolerates large consecutive sips well. He is noted to have mild oral dysphagia with minimal amount of buccal residual on right from pts lunch. Provided verbal and visual cues for lingusl sweep and educated family. Pt may continue current diet. SLP to f/u on CIR.     Aspiration Risk  Mild    Diet Recommendation Regular;Thin liquid   Liquid Administration via: Cup;Straw Medication Administration: Whole meds with liquid Supervision: Patient able to self feed Compensations: Slow rate;Small sips/bites;Check for pocketing Postural Changes and/or Swallow Maneuvers: Seated upright 90 degrees;Upright 30-60 min after meal    Other   Recommendations Oral Care Recommendations: Oral care before and after PO   Follow Up Recommendations  Inpatient Rehab    Frequency and Duration min 2x/week  2 weeks   Pertinent Vitals/Pain NA    SLP Swallow Goals     Swallow Study Prior Functional Status       General HPI: 63 y.o. male admitted to [facility] on 09/15/13 with past medical history of COPD, insulin-dependent type 2 diabetes, hypothyroidism, depression, PTSD, and hyperlipidemia presents with right upper extremity weakness, right facial droop, and dysarthria.  CT negative. MRI reveals Acute ischemic infarct involving the posterior limb of the left internal capsule.  Additional subcentimeter ischemic infarct within the deep white matter of the left centrum semi ovale.  Remote subcentimeter lacunar infarcts involving the bilateral basal ganglia bilaterally.  Type of Study: Bedside swallow evaluation Diet Prior to this Study: Regular;Thin liquids Temperature Spikes Noted: No Respiratory Status: Room air History of Recent Intubation: No Behavior/Cognition: Alert;Cooperative;Pleasant mood Oral Cavity - Dentition: Adequate natural dentition Self-Feeding Abilities: Able to feed self Patient Positioning: Upright in chair Baseline Vocal Quality: Hoarse Volitional Cough: Strong Volitional Swallow: Able to elicit    Oral/Motor/Sensory Function Overall Oral Motor/Sensory Function: Impaired Labial ROM: Reduced right Labial Symmetry: Abnormal symmetry right Labial Strength: Reduced Labial Sensation: Reduced Lingual ROM: Reduced right Lingual Symmetry: Within Functional Limits Lingual Strength: Within Functional Limits Lingual Sensation: Reduced Facial ROM: Within Functional Limits Facial Symmetry: Within Functional Limits Facial Strength: Within Functional Limits Facial Sensation: Reduced Velum: Within Functional Limits Mandible: Within Functional Limits   Ice Chips     Thin Liquid Thin Liquid: Within functional  limits Presentation:  Cup;Straw;Self Fed    Nectar Thick Nectar Thick Liquid: Not tested   Honey Thick Honey Thick Liquid: Not tested   Puree Puree: Within functional limits   Solid   GO    Solid: Not tested Other Comments: Pt with obviously pocketed food in right buccal cavity from lunch       Brad Taylor, Brad Taylor 09/18/2013,3:28 PM

## 2013-09-18 NOTE — Progress Notes (Addendum)
Called by the nurse evaluated the patient. He complains of fleeting blurying of his vision involving the right eye. The patient apparently had cataract surgery about a month ago. There is no history of glaucoma. Examination shows that he continues to have the marked dysarthria and right hemiparesis unchanged from earlier. BP146/70. Visual fields are intact by extensive bedside testing. Pupils are reactive and extraocular movements are full. I do not believe that the patient has had a extension of his stroke or a new infarct. The feeling of the vision impairment could be related to diabetes or other ophthalmic problems. We'll continue to observe the patient.

## 2013-09-18 NOTE — Clinical Social Work Psychosocial (Signed)
Clinical Social Work Department BRIEF PSYCHOSOCIAL ASSESSMENT 09/18/2013  Patient:  Brad Taylor, Brad Taylor     Account Number:  [id number]     Admit date:  09/15/2013  Clinical Social Worker:  Lovey Newcomer  Date/Time:  09/18/2013 03:45 PM  Referred by:  Physician  Date Referred:  09/18/2013 Referred for  SNF Placement   Other Referral:   Interview type:  Family Other interview type:   Patient and wife interviewed at bedside.    PSYCHOSOCIAL DATA Living Status:  WIFE Admitted from facility:   Level of care:   Primary support name:  Brad Taylor Primary support relationship to patient:  SPOUSE Degree of support available:   Support is [employer]aute.    CURRENT CONCERNS Current Concerns  Post-Acute Placement   Other Concerns:    SOCIAL WORK ASSESSMENT / PLAN CSW met with patient and wife at bedside to complete assessment. Per CSW handoff, patient is not a candidate for CIR. Patient and wife were not aware of this and were very upset by this information. Patient and wife did not understand why PT recommending patient for CIR if he was not going to be accepted by CIR. CSW explained that unit CSW can follow up with family to provide more clarification about the matter, as this CSW is unsure of why patient is not a CIR candidate. Patient and wife are agreeable to SNF search as backup but really want patient to go to CIR. Wife was also curious about HHPT services for patient. CSW explained SNF search process and informed patient and wife that unit CSW will follow up with bed offers on Monday.   Assessment/plan status:  Psychosocial Support/Ongoing Assessment of Needs Other assessment/ plan:   Complete FL2, Fax, PASRR, [employer] Auth   Information/referral to community resources:   SNF list given to patient and wife.    PATIENT'S/FAMILY'S RESPONSE TO PLAN OF CARE: Patient and wife are agreeable to SNF search as backup to CIR but they are still planning on a CIR admission.  Patient and wife were frustrated during assessment as they were under the impression that patient would be admitted to CIR on "Monday". CSW will follow up with bed offers.       Liz Beach, [place], [place], [phone]

## 2013-09-18 NOTE — Progress Notes (Signed)
FMTS Attending Note  I personally saw and evaluated the patient. The plan of care was discussed with the resident team. I agree with the assessment and plan as documented by the resident.   Neuro Exam: RUE 3/5 strength, able to get 4/5 for very short periods of time, patient continues to have significant dysarthria, RLE strength 4/5.   Agree with resident note to perform serial neuro exams. If patient continues to show decline would consider repeat Head CT to rule out Hemorrhagic stroke vs. progression of current CVA. Continue current management.   Appreciate Neurology input.  Dossie Arbour MD

## 2013-09-18 NOTE — Progress Notes (Signed)
Speech Language Pathology Treatment: Cognitive-Linquistic  Patient Details Name: Brad Taylor MRN: [id number] DOB: Jun 21, 1951 Today's Date: 09/18/2013 Time: 1410-1435 SLP Time Calculation (min): 25 min  Assessment / Plan / Recommendation Clinical Impression  Pt seen for treatment of dysarthria, word finding. SLP provided moderate verbal and visual cues for pt to increase intelligibility at word, phrase and conversation level. Pt will need continued therapy to address compensatory strategies and enhance carry over. Hopeful for CIR.    HPI HPI: 63 y.o. male admitted to [facility] on 09/15/13 with past medical history of COPD, insulin-dependent type 2 diabetes, hypothyroidism, depression, PTSD, and hyperlipidemia presents with right upper extremity weakness, right facial droop, and dysarthria.  CT negative. MRI reveals Acute ischemic infarct involving the posterior limb of the left internal capsule.  Additional subcentimeter ischemic infarct within the deep white matter of the left centrum semi ovale.  Remote subcentimeter lacunar infarcts involving the bilateral basal ganglia bilaterally.    Pertinent Vitals NA  SLP Plan  Continue with current plan of care    Recommendations Medication Administration: Whole meds with liquid Supervision: Patient able to self feed Compensations: Slow rate;Small sips/bites;Check for pocketing Postural Changes and/or Swallow Maneuvers: Seated upright 90 degrees;Upright 30-60 min after meal              General recommendations: Rehab consult Oral Care Recommendations: Oral care before and after PO Follow up Recommendations: Inpatient Rehab Plan: Continue with current plan of care    GO    [facility], MA CCC-SLP [phone]  Lynann Beaver 09/18/2013, 3:30 PM

## 2013-09-18 NOTE — Progress Notes (Signed)
Family Medicine Teaching Service Daily Progress Note Intern Pager: [phone]  Patient name: Brad Taylor Medical record number: [id number] Date of birth: September 04, 1950 Age: 63 y.o. Gender: male  Primary Care Provider: Odette Fraction, MD Consultants: Neurology Code Status: Full  Pt Overview and Major Events to Date:  2/11 - Admitted code stroke, Head CT neg, not tPA candidate, NIH 4, ASA, work-up 2/12 - switch to ASA 81 + Plavix 75, MRI/MRA (acute L-CVA), carotids (neg), PT/OT/SLP 2/13 - CIR agrees with admission (pending acceptance / ins clearance), ECHO (nml EF, no embolic etiology) [id number] - cont PT/OT/SLP, pending CIR placement  Assessment and Plan: Brad Taylor is a 63 y.o. male presenting with right facial droop, slurred speech, and right upper extremity weakness concerning for CVA. PMH is significant for COPD, DM-2, Hypothyroidism, GERD, Depression, PTSD, and HLD.  # Acute Stroke (L-post L-internal capsule), residual symptoms (RUE weakness, dysarthria) Admitted code stroke, no prior h/o CVA, previously on home ASA 81 daily, initial work-up with  Head CT negative for acute bleed / infarct, not tPA candidate, NIH 4 (THRIVE score calculation of prognosis / recovery 86-88% - currently clinically stable, mildly elevated BP at 141/69 (no prior HTN) and currently not on any anti-HTN, previously allowed permissive HTN. MRI/MRA (2/12 - acute ischemic infarct). Carotid Dopplers and ECHO (unremarkable), Risk stratification labs (listed below). - telemetry, neuro checks - Antiplatelet therapy - DAPT with ASA 81 + Plavix 75mg  daily (2/12>>), expect for DAPT x 21 days, then switch to Plavix only (on 10/07/13) - PT/SLP consults --> recommend CIR, screened for appropriateness. Consulted CIR (see details under disposition) - Requested continued PT over weekend d/t lack of CIR placement - SLP - concern for possible cough/difficulty clearing secretions, if persist with meals --> recommend swallow  study, contacted SLP regarding concern for persistent difficulty and ordered swallow study - clinically RUE weakness appears to have regressed today (limited ability to lift RUE off of bed, weaker hand grip), however on earlier exam by attending noted to have some more strength (but still decline from 09/17/13), concern for potential enlargement of stroke vs development of unlikely hemorrhagic stroke, however overall less likely given no new neurological deficits on exam, and asymptomatic otherwise - Re-eval neuro check later today, consider repeat Head CT to r/o possible hemorrhage (low clinical suspicion)  # Type 2 DM, well controlled - Hb A1C 7.4 - CBGs stable 120-211 - CBG's TIDAC and QHS  - SSI Moderate  - Consider Lantus dose if uncontrolled  # Concern for possible PAF - Stable - In the ED, patient appeared to be in atrial fibrillation on the cardiac monitor, concern for potential PAF that may have triggered CVA - EKG obtained and revealed sinus arrythmia with 1st degree AV block (per my reading).  - repeat EKG in AM with Sinus bradycardia, normal p waves, no evidence of irregularity or AFib - DC'd telemetry without events - currently NSR - Recommend outpatient follow-up for event / rhythm monitor to capture potential PAF as etiology  # Mildly Elevated BP - Stable  No prior h/o dx HTN, allowed permissive HTN 24 hrs after CVA - Stable BP 130/84 - continue Lisinopril 10mg  daily (no prior ACEi, given h/o DM2)  # Hypothyroidism  - Continuing home Synthroid  # GERD  - Continuing PPI (using formulary protonix)  # Psych: Depression/PTSD  - Continuing home medications as patient has been on them for years: Trazodone, Divalproex, BuSpar, Bupropion, Venlafaxine.  # HLD  - prior home dose Crestor 20mg   daily - Atorvastatin 80mg  daily  # COPD - Stable - Patient is only on albuterol at home which his wife states she uses very infrequently  - No evidence of acute exacerbation currently   - Monitor closely during admission  # B/l LE Ulcerations, secondary to h/o DM2 - Currently wrapped and frequent dressing changes  FEN/GI: SL IV, Carb modified diet  Prophylaxis: Lovenox  Disposition: Continue to clinically monitor status post acute CVA on admission, PT/OT recommend CIR, CIR has accepted and approved patient, however unable to transfer to CIR service until insurance approval / denial, expect pt to remain on FPTS until otherwise noted possibly by 09/20/13, unless alternative placement. Patient not appropriate for discharge to home with [facility] - c/s CIR re: inpatient rehab in setting of acute CVA - greatly appreciate recommendations       - CIR eval determines patient is appropriate for CIR, except 7-9 day length rehab, excellent prognosis, with plan to DC home and continued [facility] therapy       - However, OT has not eval'd patient yet, pending (will contact today)       - CIR admissions raises concern re: [employer] Medicare ins, may take >24 hrs to get approved, and potential for denied. Patient disposition, currently unclear  Subjective: No acute events overnight. No family in patient's room this morning. Patient reports feeling about the same today with persistent slurred speech. However, admits to increased weakness in R-arm, unknown how long deficit has worsened for, as he was able to move/lift it significantly more yesterday. States he can tolerate solid food, but previous concerns about coughing/choking.  Denies any HA, fever/chills, CP, abd pain, new weakness in other limbs, vision changes, numbness, tingling.  Objective: Temp:  [97.9 F (36.6 C)-98.9 F (37.2 C)] 97.9 F (36.6 C) (02/14 0500) Pulse Rate:  [69] 69 (02/14 0500) Resp:  [18-20] 18 (02/14 0500) BP: (128-161)/(78-84) 130/84 mmHg (02/14 0948) SpO2:  [91 %-97 %] 91 % (02/14 0500) Physical Exam: General: obese male, resting in bed, awakens on exam, NAD.  HEENT: PERRL, EOMI, MMM.  Cardiovascular: RRR, 2/6 systolic  murmur heard.  Respiratory: Mostly CTAB with occasional scattered wheezes, without focal crackles. Mild diminished air movement b/l. Normal WOB  Abdomen: obese, soft, NTND. Surgical scar noted from prior cholecystectomy.  Extremities: Trace LE edema with hyperpigmentation consistent with chronic venous insufficiency, b/l LE ankles/feet wrapped in gauze and ace wrap (noted ulceration)  Skin: Right ankle - small healing ulceration (approximately 0.5 cm x 0.5 cm). Left ankle - 1 cm x 1 cm ulceration noted.  Neuro: AO x 3. Persistent mild slurred speech (w/o significant improvement). CN 2-12 intact. (No appreciated R facial droop. Muscle strength - normal 5/5 in 3 ext, except decreased to 3/5 RUE and significant pronator drift with difficulty lifting RUE off of bed (worse since yesterday)  Laboratory:  Recent Labs Lab 09/15/13 1833 09/15/13 1845  WBC 10.2  --   HGB 15.9 17.0  HCT 45.3 50.0  PLT 175  --     Recent Labs Lab 09/15/13 1833 09/15/13 1845  NA 137 135*  K 5.0 4.6  CL 95* 95*  CO2 30  --   BUN 10 9  CREATININE 0.83 1.00  CALCIUM 9.2  --   PROT 7.0  --   BILITOT 0.3  --   ALKPHOS 64  --   ALT 12  --   AST 13  --   GLUCOSE 125* 124*   Hb A1C (7.4) TSH (4.045)  Lipid panel (TC 216 / TG 288 / HDL 35 / LDL 123) Troponin-I - negative UA - negative UTox - negative  Imaging/Diagnostic Tests:  2/11 Portable CXR 1v IMPRESSION:  Negative except for mild cardiac enlargement  2/11 Head CT w/o contrast IMPRESSION:  No focal acute intracranial abnormality identified. Chronic diffuse  atrophy. Chronic bilateral periventricular white matter small vessel  ischemic change.  2/12 MRI Brain / MRA Head IMPRESSION:  MRI HEAD:  1. Acute ischemic infarct involving the posterior limb of the left  internal capsule.  2. Additional subcentimeter ischemic infarct within the deep white  matter of the left centrum semi ovale.  3. Remote subcentimeter lacunar infarcts involving the  bilateral  basal ganglia bilaterally.  4. Moderate cerebral atrophy and chronic microvascular ischemic  changes.  MRA HEAD:  1. Mild multi focal irregularity within the cavernous ICAs  bilaterally, left greater than right, likely related to underlying  atherosclerotic disease. No high-grade flow-limiting stenosis or  proximal branch arterial occlusion identified.  2. No intracranial aneurysm.  2/13 ECHO - Left ventricle: The cavity size was normal. Systolic function was normal. The estimated ejection fraction was in the range of 60% to 65%. Wall motion was normal; there were no regional wall motion abnormalities. - Left atrium: The atrium was moderately dilated. - Atrial septum: No defect or patent foramen ovale was identified.  Nobie Putnam, DO 09/18/2013, 10:39 AM PGY-1, [place] Intern pager: [phone], text pages welcome

## 2013-09-18 NOTE — Progress Notes (Signed)
Stroke Team Progress Note  HISTORY Brad Taylor is a 63 y.o. male who awakened 09/15/2013 and was not feeling well. His blood sugar was elevated at 180 and he decided to go back to sleep. When he awakened later he noted that he was stumbling as he tried to get to the bathroom so after using the bathroom he went back to bed. When he got out of bed later he was having some slurred speech and was complaining that his right arm did not feel right. He thought that he slept on it wrong. When his son came home at 5:30 he noted that his face was drooping, speech was very slurred and he could not use his right arm well. EMS was called at that time and the patient was brought in as a code stroke.   Date last known well: Date: 09/15/2013  Time last known well: Time: 14:00  tPA Given: No: Outside time window   SUBJECTIVE No new complaints. Still w R hemiparesis.   OBJECTIVE Most recent Vital Signs: Filed Vitals:   09/17/13 1228 09/17/13 2127 09/18/13 0500 09/18/13 0948  BP: 161/84 147/83 128/78 130/84  Pulse: 69 69 69   Temp: 98.9 F (37.2 C) 98.2 F (36.8 C) 97.9 F (36.6 C)   TempSrc: Oral Oral Oral   Resp: 20 18 18    Height:      Weight:      SpO2: 95% 97% 91%    CBG (last 3)   Recent Labs  09/17/13 2124 09/18/13 0745 09/18/13 1134  GLUCAP 143* 167* 181*    IV Fluid Intake:     MEDICATIONS  . aspirin EC  81 mg Oral Daily  . atorvastatin  80 mg Oral q1800  . buPROPion  150 mg Oral Daily  . clopidogrel  75 mg Oral Q breakfast  . divalproex  1,000 mg Oral QHS  . enoxaparin (LOVENOX) injection  40 mg Subcutaneous Q24H  . insulin aspart  0-15 Units Subcutaneous TID WC  . levothyroxine  125 mcg Oral QAC breakfast  . lisinopril  10 mg Oral Daily  . pantoprazole  40 mg Oral Daily  . sodium chloride  3 mL Intravenous Q12H  . traZODone  300 mg Oral QHS  . venlafaxine XR  150 mg Oral BID   PRN:  bacitracin, busPIRone, sodium chloride  Diet:  Carb Control thin liquids Activity:   Up with assistance DVT Prophylaxis:  Lovenox  CLINICALLY SIGNIFICANT STUDIES Basic Metabolic Panel:   Recent Labs Lab 09/15/13 1833 09/15/13 1845  NA 137 135*  K 5.0 4.6  CL 95* 95*  CO2 30  --   GLUCOSE 125* 124*  BUN 10 9  CREATININE 0.83 1.00  CALCIUM 9.2  --    Liver Function Tests:   Recent Labs Lab 09/15/13 1833  AST 13  ALT 12  ALKPHOS 64  BILITOT 0.3  PROT 7.0  ALBUMIN 3.6   CBC:   Recent Labs Lab 09/15/13 1833 09/15/13 1845  WBC 10.2  --   NEUTROABS 4.8  --   HGB 15.9 17.0  HCT 45.3 50.0  MCV 90.2  --   PLT 175  --    Coagulation:   Recent Labs Lab 09/15/13 1833  LABPROT 13.3  INR 1.03   Cardiac Enzymes:   Recent Labs Lab 09/15/13 1833  TROPONINI <0.30   Urinalysis:   Recent Labs Lab 09/16/13 0642  COLORURINE YELLOW  LABSPEC 1.021  PHURINE 7.5  GLUCOSEU NEGATIVE  HGBUR NEGATIVE  BILIRUBINUR LARGE*  KETONESUR NEGATIVE  PROTEINUR NEGATIVE  UROBILINOGEN 1.0  NITRITE NEGATIVE  LEUKOCYTESUR NEGATIVE   Lipid Panel    Component Value Date/Time   CHOL 216* 09/16/2013 0415   TRIG 288* 09/16/2013 0415   HDL 35* 09/16/2013 0415   CHOLHDL 6.2 09/16/2013 0415   VLDL 58* 09/16/2013 0415   LDLCALC 123* 09/16/2013 0415   HgbA1C  Lab Results  Component Value Date   HGBA1C 7.4* 09/16/2013    Urine Drug Screen:     Component Value Date/Time   LABOPIA NONE DETECTED 09/16/2013 0642   COCAINSCRNUR NONE DETECTED 09/16/2013 0642   LABBENZ NONE DETECTED 09/16/2013 0642   AMPHETMU NONE DETECTED 09/16/2013 0642   THCU NONE DETECTED 09/16/2013 0642   LABBARB NONE DETECTED 09/16/2013 0642    Alcohol Level:   Recent Labs Lab 09/15/13 1833  ETH <11    Ct Head Wo Contrast 09/15/2013    No focal acute intracranial abnormality identified. Chronic diffuse atrophy. Chronic bilateral periventricular white matter small vessel ischemic change.   Mr Brain Wo Contrast 09/17/2013    MRI HEAD:   1. Acute ischemic infarct involving the posterior  limb of the left internal capsule. 2. Additional subcentimeter ischemic infarct within the deep white matter of the left centrum semi ovale. 3. Remote subcentimeter lacunar infarcts involving the bilateral basal ganglia bilaterally. 4. Moderate cerebral atrophy and chronic microvascular ischemic changes.    MRA HEAD:   1. Mild multi focal irregularity within the cavernous ICAs bilaterally, left greater than right, likely related to underlying atherosclerotic disease. No high-grade flow-limiting stenosis or proximal branch arterial occlusion identified. 2. No intracranial aneurysm.     Dg Chest Port 1 View 09/15/2013    Negative except for mild cardiac enlargement       2D Echocardiogram  ejection fraction 60-65%. No cardiac source of emboli was identified.  Carotid Doppler Preliminary report: 1-39% ICA stenosis. Vertebral artery flow is antegrade.   EKG sinus bradycardia rate 56 beats per minute. For complete results please see formal report.   Therapy Recommendations - inpatient rehabilitation recommended. Rehabilitation M.D. has seen patient and agrees.  Physical Exam   Neurologic Examination:  Mental Status:  Alert, oriented, thought content appropriate. Speech nonfluent. At times words can not be distinguished. Speech is slurred. Able to follow 3 step commands without difficulty.  Cranial Nerves:  II: Discs flat bilaterally; Visual fields grossly normal, pupils equal, round, reactive to light and accommodation  III,IV, VI: ptosis not present, extra-ocular motions intact bilaterally  V,VII: mild right facial droop, facial light touch sensation normal bilaterally  VIII: hearing normal bilaterally  IX,X: gag reflex present  XI: bilateral shoulder shrug  XII: midline tongue extension  Motor:  Right : Upper extremity 3/5 R - Arm, R Hand 1-2/5. Left: Upper extremity 5/5  Lower extremity 5/5 Lower extremity 5/5  Tone and bulk:normal tone throughout; no atrophy noted  Sensory: Pinprick  and light touch decreased on the RUE  Deep Tendon Reflexes: 2+ and symmetric with absent AJ's bilaterally  Plantars:  Right: downgoing Left: downgoing  Cerebellar:  normal finger-to-nose and normal heel-to-shin test. Difficult to perform on the right secondary to weakness  Gait: Unable to test safely  CV: pulses palpable throughout    ASSESSMENT Mr. Brad Taylor is a 63 y.o. male presenting with right upper extremity weakness and speech difficulties. TPA was not given secondary to late presentation. MRI -acute ischemic infarct involving the posterior limb of the left internal capsule. 2. Additional  subcentimeter ischemic infarct within the deep white matter of the left centrum semi ovale. 3. Remote subcentimeter lacunar infarcts involving the bilateral basal ganglia bilaterally. Infarcts felt to be embolic secondary to an unknown source versus small vessel disease. On aspirin 81 mg orally every day prior to admission. Now on aspirin 81 mg orally every day and clopidogrel 75 mg orally every day for secondary stroke prevention. Patient with resultant right upper extremity paresis with speech difficulties. Work up underway.   Hyperlipidemia - cholesterol 216 LDL 123 - Crestor 20 mg daily prior to admission - now on Lipitor 80 mg daily  Diabetes mellitus - hemoglobin A1c 7.4  Hypertension  Irregular heart rate in the emergency department ( sinus rhythm with PVCs noted on telemetry )   Hospital day # 3  TREATMENT/PLAN  Continue aspirin 81 mg orally every day and clopidogrel 75 mg orally every day for secondary stroke prevention.  Inpatient rehabilitation plan  Suggest loop recorder - although multiple strokes may be due to small vessel disease. May do as outpt.     Mikey Bussing PA-C [facility] Pager [phone] 09/18/2013, 1:12 PM  I have personally obtained a history, examined the patient, evaluated imaging results, and formulated the assessment and plan of  care. I agree with the above.

## 2013-09-19 ENCOUNTER — Encounter (HOSPITAL_COMMUNITY): Payer: Self-pay | Admitting: *Deleted

## 2013-09-19 ENCOUNTER — Inpatient Hospital Stay (HOSPITAL_COMMUNITY): Payer: Medicare HMO

## 2013-09-19 LAB — GLUCOSE, CAPILLARY
GLUCOSE-CAPILLARY: 230 mg/dL — AB (ref 70–99)
Glucose-Capillary: 186 mg/dL — ABNORMAL HIGH (ref 70–99)
Glucose-Capillary: 203 mg/dL — ABNORMAL HIGH (ref 70–99)
Glucose-Capillary: 160 mg/dL — ABNORMAL HIGH (ref 70–99)

## 2013-09-19 NOTE — Progress Notes (Signed)
[facility] Attending Note  I personally saw and evaluated the patient. The plan of care was discussed with the resident team. I agree with the assessment and plan as documented by the resident.   Patient reports that right eye blurriness that was present yesterday has now resolved, remains profoundly weak in the right upper extremity.   Neuro Exam: patient not able to move RUE on my examination today however 3/5 per resident exam, dysarthria stable  CVA - Resident physician spoke to Neurology about worsening clinical picture, CT head ordered which showed no hemorrhage and stable lesion. Continue serial neuro exams. Plan for CIR tomorrow if accepted.   Dossie Arbour MD

## 2013-09-19 NOTE — Progress Notes (Signed)
Clinical Social Work Department CLINICAL SOCIAL WORK PLACEMENT NOTE 09/19/2013  Patient:  Brad Taylor, Brad Taylor  Account Number:  [id number] Admit date:  09/15/2013  Clinical Social Worker:  Ky Barban, Latanya Presser  Date/time:  09/19/2013 01:07 PM  Clinical Social Work is seeking post-discharge placement for this patient at the following level of care:   [place]   (*CSW will update this form in Epic as items are completed)   N/A-per assessment from [facility] completed on 09/18/13, pt and wife agreeable to fax out to all SNFs in [facility]  Patient/family provided with [place] Department of Clinical Social Work's list of facilities offering this level of care within the geographic area requested by the patient (or if unable, by the patient's family).  09/19/2013  Patient/family informed of their freedom to choose among providers that offer the needed level of care, that participate in Medicare, Medicaid or managed care program needed by the patient, have an available bed and are willing to accept the patient.  N/A-clinicals not sent to this SNF  Patient/family informed of [facility]' ownership interest in [facility], as well as of the fact that they are under no obligation to receive care at this facility.  PASARR submitted to EDS on 09/19/2013 PASARR number received from EDS on 09/19/2013  FL2 transmitted to all facilities in geographic area requested by pt/family on  09/19/2013 FL2 transmitted to all facilities within larger geographic area on   Patient informed that his/her managed care company has contracts with or will negotiate with  certain facilities, including the following:     Patient/family informed of bed offers received:   Patient chooses bed at  Physician recommends and patient chooses bed at    Patient to be transferred to  on   Patient to be transferred to facility by   The following physician request were entered in Epic:   Additional  Comments:   Ky Barban, MSW, [place] Social Worker [phone]

## 2013-09-19 NOTE — Progress Notes (Signed)
Stroke Team Progress Note  HISTORY Brad Taylor is a 63 y.o. male who awakened 09/15/2013 and was not feeling well. His blood sugar was elevated at 180 and he decided to go back to sleep. When he awakened later he noted that he was stumbling as he tried to get to the bathroom so after using the bathroom he went back to bed. When he got out of bed later he was having some slurred speech and was complaining that his right arm did not feel right. He thought that he slept on it wrong. When his son came home at 5:30 he noted that his face was drooping, speech was very slurred and he could not use his right arm well. EMS was called at that time and the patient was brought in as a code stroke.   Date last known well: Date: 09/15/2013  Time last known well: Time: 14:00  tPA Given: No: Outside time window   SUBJECTIVE  No new blurry vision is reported. The patient may have had worsening weakness of the legs especially on the right side. The wife believes that this is because he has been in bed all along. The patient himself reports that his strength seems unchanged. However, he does report that there is per to be some fluctuation in the strength of the right arm. At times he can move it and at other times he cannot. Repeat scan was ordered however because of the potential for worsening right leg weakness.  OBJECTIVE Most recent Vital Signs: Filed Vitals:   09/18/13 1346 09/18/13 2100 09/19/13 0000 09/19/13 0400  BP: 134/69 153/83 135/79 146/86  Pulse: 64 52 65 73  Temp: 98.2 F (36.8 C) 97.9 F (36.6 C) 97.5 F (36.4 C) 98.4 F (36.9 C)  TempSrc: Oral     Resp: 20 20 20 18   Height:      Weight:      SpO2: 97% 95% 95% 97%   CBG (last 3)   Recent Labs  09/18/13 2039 09/19/13 0728 09/19/13 1158  GLUCAP 174* 186* 230*    IV Fluid Intake:     MEDICATIONS  . aspirin EC  81 mg Oral Daily  . atorvastatin  80 mg Oral q1800  . buPROPion  150 mg Oral Daily  . clopidogrel  75 mg Oral Q  breakfast  . divalproex  1,000 mg Oral QHS  . enoxaparin (LOVENOX) injection  40 mg Subcutaneous Q24H  . insulin aspart  0-15 Units Subcutaneous TID WC  . levothyroxine  125 mcg Oral QAC breakfast  . lisinopril  10 mg Oral Daily  . pantoprazole  40 mg Oral Daily  . sodium chloride  3 mL Intravenous Q12H  . traZODone  300 mg Oral QHS  . venlafaxine XR  150 mg Oral BID   PRN:  busPIRone, sodium chloride  Diet:  Carb Control thin liquids Activity:  Up with assistance DVT Prophylaxis:  Lovenox  CLINICALLY SIGNIFICANT STUDIES Basic Metabolic Panel:   Recent Labs Lab 09/15/13 1833 09/15/13 1845  NA 137 135*  K 5.0 4.6  CL 95* 95*  CO2 30  --   GLUCOSE 125* 124*  BUN 10 9  CREATININE 0.83 1.00  CALCIUM 9.2  --    Liver Function Tests:   Recent Labs Lab 09/15/13 1833  AST 13  ALT 12  ALKPHOS 64  BILITOT 0.3  PROT 7.0  ALBUMIN 3.6   CBC:   Recent Labs Lab 09/15/13 1833 09/15/13 1845  WBC 10.2  --  NEUTROABS 4.8  --   HGB 15.9 17.0  HCT 45.3 50.0  MCV 90.2  --   PLT 175  --    Coagulation:   Recent Labs Lab 09/15/13 1833  LABPROT 13.3  INR 1.03   Cardiac Enzymes:   Recent Labs Lab 09/15/13 1833  TROPONINI <0.30   Urinalysis:   Recent Labs Lab 09/16/13 0642  COLORURINE YELLOW  LABSPEC 1.021  PHURINE 7.5  GLUCOSEU NEGATIVE  HGBUR NEGATIVE  BILIRUBINUR LARGE*  KETONESUR NEGATIVE  PROTEINUR NEGATIVE  UROBILINOGEN 1.0  NITRITE NEGATIVE  LEUKOCYTESUR NEGATIVE   Lipid Panel    Component Value Date/Time   CHOL 216* 09/16/2013 0415   TRIG 288* 09/16/2013 0415   HDL 35* 09/16/2013 0415   CHOLHDL 6.2 09/16/2013 0415   VLDL 58* 09/16/2013 0415   LDLCALC 123* 09/16/2013 0415   HgbA1C  Lab Results  Component Value Date   HGBA1C 7.4* 09/16/2013    Urine Drug Screen:     Component Value Date/Time   LABOPIA NONE DETECTED 09/16/2013 0642   COCAINSCRNUR NONE DETECTED 09/16/2013 0642   LABBENZ NONE DETECTED 09/16/2013 0642   AMPHETMU NONE  DETECTED 09/16/2013 0642   THCU NONE DETECTED 09/16/2013 0642   LABBARB NONE DETECTED 09/16/2013 0642    Alcohol Level:   Recent Labs Lab 09/15/13 1833  ETH <11    Ct Head Wo Contrast 09/15/2013    No focal acute intracranial abnormality identified. Chronic diffuse atrophy. Chronic bilateral periventricular white matter small vessel ischemic change.   Mr Brain Wo Contrast 09/17/2013    MRI HEAD:   1. Acute ischemic infarct involving the posterior limb of the left internal capsule. 2. Additional subcentimeter ischemic infarct within the deep white matter of the left centrum semi ovale. 3. Remote subcentimeter lacunar infarcts involving the bilateral basal ganglia bilaterally. 4. Moderate cerebral atrophy and chronic microvascular ischemic changes.    MRA HEAD:   1. Mild multi focal irregularity within the cavernous ICAs bilaterally, left greater than right, likely related to underlying atherosclerotic disease. No high-grade flow-limiting stenosis or proximal branch arterial occlusion identified. 2. No intracranial aneurysm.     Head CT scan is reviewed in person and shows hypodensity oval-shaped involving the posterior limb of the internal capsule on the left side. No hemorrhages noted. 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  Dg Chest Port 1 View 09/15/2013    Negative except for mild cardiac enlargement       2D Echocardiogram  ejection fraction 60-65%. No cardiac source of emboli was identified.  Carotid Doppler Preliminary report: 1-39% ICA stenosis. Vertebral artery flow is antegrade.   EKG sinus bradycardia rate 56 beats per minute. For complete results please see formal report.   Therapy Recommendations - inpatient rehabilitation recommended. Rehabilitation M.D. has seen patient and agrees.  Physical Exam   Neurologic Examination:  Mental Status:  Alert, oriented, thought content appropriate. Speech nonfluent. At times words  can not be distinguished. Speech is slurred. Speech is overall better today with less dysarthria. Able to follow 3 step commands without difficulty.  Cranial Nerves:  II: Discs flat bilaterally; Visual fields grossly normal, pupils equal, round, reactive to light and accommodation  III,IV, VI: ptosis not present, extra-ocular motions intact bilaterally  V,VII: mild right facial droop, facial light touch sensation normal bilaterally  VIII: hearing normal bilaterally  IX,X: gag reflex present  XI: bilateral shoulder shrug  XII: midline tongue extension  Motor:  Right : Upper extremity 0/5 R -  Arm, R Hand 0/5. Left: Upper extremity 5/5  Lower extremity 5/5 Lower extremity 5/5  Tone and bulk:normal tone throughout; no atrophy noted  Sensory: Pinprick and light touch decreased on the RUE  Deep Tendon Reflexes: 2+ and symmetric with absent AJ's bilaterally  Plantars:  Right: downgoing Left: downgoing  Cerebellar:  normal finger-to-nose and normal heel-to-shin test. Difficult to perform on the right secondary to weakness  Gait: Unable to test safely  CV: pulses palpable throughout    ASSESSMENT Brad Taylor is a 63 y.o. male presenting with right upper extremity weakness and speech difficulties. TPA was not given secondary to late presentation. MRI -acute ischemic infarct involving the posterior limb of the left internal capsule. 2. Additional subcentimeter ischemic infarct within the deep white matter of the left centrum semi ovale. 3. Remote subcentimeter lacunar infarcts involving the bilateral basal ganglia bilaterally. Infarcts felt to be embolic secondary to an unknown source versus small vessel disease. On aspirin 81 mg orally every day prior to admission. Now on aspirin 81 mg orally every day and clopidogrel 75 mg orally every day for secondary stroke prevention. Patient with resultant right upper extremity paresis with speech difficulties. Work up underway. There appears to be some  fluctuation on his exam and possibly worsening today. Repeat scan however does not show extension of his infarct.   Hyperlipidemia - cholesterol 216 LDL 123 - Crestor 20 mg daily prior to admission - now on Lipitor 80 mg daily  Diabetes mellitus - hemoglobin A1c 7.4  Hypertension  Irregular heart rate in the emergency department ( sinus rhythm with PVCs noted on telemetry )   Hospital day # 4  TREATMENT/PLAN  Continue aspirin 81 mg orally every day and clopidogrel 75 mg orally every day for secondary stroke prevention.  Inpatient rehabilitation plan  Suggest loop recorder - although multiple strokes may be due to small vessel disease. May do as outpt.     Mikey Bussing PA-C [facility] Pager [phone] 09/19/2013, 12:50 PM  I have personally obtained a history, examined the patient, evaluated imaging results, and formulated the assessment and plan of care. I agree with the above.

## 2013-09-19 NOTE — Progress Notes (Signed)
Family Medicine Teaching Service Daily Progress Note Intern Pager: [phone]  Patient name: Brad Taylor Medical record number: [id number] Date of birth: 07/31/1951 Age: 63 y.o. Gender: male  Primary Care Provider: Odette Fraction, MD Consultants: Neurology Code Status: Full  Pt Overview and Major Events to Date:  2/11 - Admitted code stroke, Head CT neg, not tPA candidate, NIH 4, ASA, work-up 2/12 - switch to ASA 81 + Plavix 75, MRI/MRA (acute L-CVA), carotids (neg), PT/OT/SLP 2/13 - CIR agrees with admission (pending acceptance / ins clearance), ECHO (nml EF, no embolic etiology) 1/47 - cont PT/OT/SLP, pending CIR placement 2/15 - Worsening RUE weakness, repeat CT scan   Assessment and Plan: Brad Taylor is a 63 y.o. male presenting with right facial droop, slurred speech, and right upper extremity weakness concerning for CVA. PMH is significant for COPD, DM-2, Hypothyroidism, GERD, Depression, PTSD, and HLD.  # Acute Stroke (L-post L-internal capsule), residual symptoms (RUE weakness, dysarthria) Admitted code stroke, no prior h/o CVA, previously on home ASA 81 daily, initial work-up with  Head CT negative for acute bleed / infarct, not tPA candidate - telemetry, neuro checks - Antiplatelet therapy - DAPT with ASA 81 + Plavix 75mg  daily (2/12>>), expect for DAPT x 21 days, then switch to Plavix only (on 10/07/13) - PT/SLP consults --> recommend CIR, screened for appropriateness. Consulted CIR (see details under disposition).  Most likely to SNF as not good candidate for CIR  - clinically RUE weakness worsening today.  CT w/o contrast to evaluate for possible enlarging ischemic infarct vs hemorrhagic   # Type 2 DM, well controlled - SSI while inpatient   # Concern for possible PAF - Stable - In the ED, patient appeared to be in atrial fibrillation on the cardiac monitor, concern for potential PAF that may have triggered CVA - currently NSR - Recommend outpatient follow-up for  event / rhythm monitor to capture potential PAF as etiology  # Mildly Elevated BP - Stable  No prior h/o dx HTN, allowed permissive HTN 24 hrs after CVA - Stable BP 130/84 - continue Lisinopril 10mg  daily (no prior ACEi, given h/o DM2)  # Hypothyroidism  - Continuing home Synthroid  # GERD  - Continuing PPI (using formulary protonix)  # Psych: Depression/PTSD  - Continuing home medications as patient has been on them for years: Trazodone, Divalproex, BuSpar, Bupropion, Venlafaxine.  # HLD  - prior home dose Crestor 20mg  daily - Atorvastatin 80mg  daily  # COPD - Stable - On home albuterol PRN, continue if needed   # B/l LE Ulcerations, secondary to h/o DM2 - Currently wrapped and frequent dressing changes  FEN/GI: SL IV, Carb modified diet  Prophylaxis: Lovenox  Disposition: Continue to clinically monitor status post acute CVA on admission, PT/OT recommend CIR, CIR has accepted and approved patient, however unable to transfer to CIR service until insurance approval / denial, expect pt to remain on FPTS until otherwise noted possibly by 09/20/13, unless alternative placement. Patient not appropriate for discharge to home with [facility]. - c/s CIR re: inpatient rehab in setting of acute CVA - greatly appreciate recommendations       - CIR eval determines patient is appropriate for CIR, except 7-9 day length rehab, excellent prognosis, with plan to DC home and continued [place] therapy       - However, OT has not eval'd patient yet, pending (will contact today)       - CIR admissions raises concern re: [employer] Medicare ins, may take >  24 hrs to get approved, and potential for denied. Patient disposition, currently unclear  Subjective: Pt feels his RUE has worsened, denies any current HA, palpitations.   Objective: Temp:  [97.5 F (36.4 C)-98.4 F (36.9 C)] 98.4 F (36.9 C) (02/15 0400) Pulse Rate:  [52-73] 73 (02/15 0400) Resp:  [18-20] 18 (02/15 0400) BP: (130-153)/(69-86) 146/86 mmHg  (02/15 0400) SpO2:  [95 %-97 %] 97 % (02/15 0400)  Physical Exam: General: obese male, resting in bed, awakens on exam, NAD.  HEENT: PERRL, EOMI, MMM.  Cardiovascular: RRR, 2/6 systolic murmur heard.  Respiratory: CTA B/L  Abdomen: obese, soft, NTND. Extremities: Trace LE edema with hyperpigmentation consistent with chronic venous insufficiency Neuro: AO x 3. Dysarthria. CN 2-12 grossly intact.  3/5 RUE MS which seems to be decline since yesterday. 3/5 RLE MS  Laboratory:  Recent Labs Lab 09/15/13 1833 09/15/13 1845  WBC 10.2  --   HGB 15.9 17.0  HCT 45.3 50.0  PLT 175  --     Recent Labs Lab 09/15/13 1833 09/15/13 1845  NA 137 135*  K 5.0 4.6  CL 95* 95*  CO2 30  --   BUN 10 9  CREATININE 0.83 1.00  CALCIUM 9.2  --   PROT 7.0  --   BILITOT 0.3  --   ALKPHOS 64  --   ALT 12  --   AST 13  --   GLUCOSE 125* 124*   Hb A1C (7.4) TSH (4.045) Lipid panel (TC 216 / TG 288 / HDL 35 / LDL 123) Troponin-I - negative UA - negative UTox - negative  Imaging/Diagnostic Tests:   2/12 MRI Brain / MRA Head IMPRESSION:  MRI HEAD:  1. Acute ischemic infarct involving the posterior limb of the left  internal capsule.   MRA HEAD:  1. Mild multi focal irregularity within the cavernous ICAs  bilaterally, left greater than right, likely related to underlying  atherosclerotic disease. No high-grade flow-limiting stenosis or  proximal branch arterial occlusion identified.  2. No intracranial aneurysm.  2/13 ECHO - Basically normal   Nolon Rod, DO 09/19/2013, 8:20 AM PGY-1, [place] Intern pager: [phone], text pages welcome

## 2013-09-20 ENCOUNTER — Encounter (HOSPITAL_COMMUNITY): Payer: Self-pay | Admitting: *Deleted

## 2013-09-20 ENCOUNTER — Inpatient Hospital Stay (HOSPITAL_COMMUNITY)
Admission: RE | Admit: 2013-09-20 | Discharge: 2013-10-08 | DRG: 945 | Disposition: A | Discharge status: Home Health | Payer: Medicare HMO | Source: Intra-hospital | Attending: Physical Medicine & Rehabilitation | Admitting: Physical Medicine & Rehabilitation

## 2013-09-20 DIAGNOSIS — Z794 Long term (current) use of insulin: Secondary | ICD-10-CM | POA: Diagnosis not present

## 2013-09-20 DIAGNOSIS — Z7902 Long term (current) use of antithrombotics/antiplatelets: Secondary | ICD-10-CM

## 2013-09-20 DIAGNOSIS — R471 Dysarthria and anarthria: Secondary | ICD-10-CM | POA: Diagnosis present

## 2013-09-20 DIAGNOSIS — Z5189 Encounter for other specified aftercare: Principal | ICD-10-CM

## 2013-09-20 DIAGNOSIS — Z79899 Other long term (current) drug therapy: Secondary | ICD-10-CM

## 2013-09-20 DIAGNOSIS — N390 Urinary tract infection, site not specified: Secondary | ICD-10-CM | POA: Diagnosis not present

## 2013-09-20 DIAGNOSIS — Z9089 Acquired absence of other organs: Secondary | ICD-10-CM | POA: Diagnosis not present

## 2013-09-20 DIAGNOSIS — L97909 Non-pressure chronic ulcer of unspecified part of unspecified lower leg with unspecified severity: Secondary | ICD-10-CM | POA: Diagnosis present

## 2013-09-20 DIAGNOSIS — I639 Cerebral infarction, unspecified: Secondary | ICD-10-CM | POA: Diagnosis present

## 2013-09-20 DIAGNOSIS — R488 Other symbolic dysfunctions: Secondary | ICD-10-CM | POA: Diagnosis present

## 2013-09-20 DIAGNOSIS — I6340 Cerebral infarction due to embolism of unspecified cerebral artery: Secondary | ICD-10-CM

## 2013-09-20 DIAGNOSIS — F329 Major depressive disorder, single episode, unspecified: Secondary | ICD-10-CM | POA: Diagnosis present

## 2013-09-20 DIAGNOSIS — Z9849 Cataract extraction status, unspecified eye: Secondary | ICD-10-CM

## 2013-09-20 DIAGNOSIS — E1149 Type 2 diabetes mellitus with other diabetic neurological complication: Secondary | ICD-10-CM | POA: Diagnosis present

## 2013-09-20 DIAGNOSIS — J449 Chronic obstructive pulmonary disease, unspecified: Secondary | ICD-10-CM

## 2013-09-20 DIAGNOSIS — F4310 Post-traumatic stress disorder, unspecified: Secondary | ICD-10-CM

## 2013-09-20 DIAGNOSIS — F17200 Nicotine dependence, unspecified, uncomplicated: Secondary | ICD-10-CM | POA: Diagnosis present

## 2013-09-20 DIAGNOSIS — IMO0002 Reserved for concepts with insufficient information to code with codable children: Secondary | ICD-10-CM

## 2013-09-20 DIAGNOSIS — K5900 Constipation, unspecified: Secondary | ICD-10-CM | POA: Diagnosis present

## 2013-09-20 DIAGNOSIS — K219 Gastro-esophageal reflux disease without esophagitis: Secondary | ICD-10-CM | POA: Diagnosis present

## 2013-09-20 DIAGNOSIS — R29898 Other symptoms and signs involving the musculoskeletal system: Secondary | ICD-10-CM | POA: Diagnosis present

## 2013-09-20 DIAGNOSIS — J4489 Other specified chronic obstructive pulmonary disease: Secondary | ICD-10-CM | POA: Diagnosis present

## 2013-09-20 DIAGNOSIS — F411 Generalized anxiety disorder: Secondary | ICD-10-CM | POA: Diagnosis present

## 2013-09-20 DIAGNOSIS — E039 Hypothyroidism, unspecified: Secondary | ICD-10-CM | POA: Diagnosis present

## 2013-09-20 DIAGNOSIS — F3289 Other specified depressive episodes: Secondary | ICD-10-CM | POA: Diagnosis present

## 2013-09-20 DIAGNOSIS — I10 Essential (primary) hypertension: Secondary | ICD-10-CM | POA: Diagnosis present

## 2013-09-20 DIAGNOSIS — E1165 Type 2 diabetes mellitus with hyperglycemia: Secondary | ICD-10-CM

## 2013-09-20 DIAGNOSIS — R29810 Facial weakness: Secondary | ICD-10-CM | POA: Diagnosis present

## 2013-09-20 DIAGNOSIS — E669 Obesity, unspecified: Secondary | ICD-10-CM | POA: Diagnosis present

## 2013-09-20 DIAGNOSIS — E785 Hyperlipidemia, unspecified: Secondary | ICD-10-CM | POA: Diagnosis present

## 2013-09-20 DIAGNOSIS — E1142 Type 2 diabetes mellitus with diabetic polyneuropathy: Secondary | ICD-10-CM

## 2013-09-20 DIAGNOSIS — I119 Hypertensive heart disease without heart failure: Secondary | ICD-10-CM

## 2013-09-20 DIAGNOSIS — E118 Type 2 diabetes mellitus with unspecified complications: Secondary | ICD-10-CM

## 2013-09-20 LAB — GLUCOSE, CAPILLARY
GLUCOSE-CAPILLARY: 207 mg/dL — AB (ref 70–99)
GLUCOSE-CAPILLARY: 209 mg/dL — AB (ref 70–99)
Glucose-Capillary: 185 mg/dL — ABNORMAL HIGH (ref 70–99)

## 2013-09-20 LAB — CBC
HEMATOCRIT: 45.6 % (ref 39.0–52.0)
Hemoglobin: 16.3 g/dL (ref 13.0–17.0)
MCH: 32 pg (ref 26.0–34.0)
MCHC: 35.7 g/dL (ref 30.0–36.0)
MCV: 89.6 fL (ref 78.0–100.0)
Platelets: 180 10*3/uL (ref 150–400)
RBC: 5.09 MIL/uL (ref 4.22–5.81)
RDW: 12.8 % (ref 11.5–15.5)
WBC: 10.3 10*3/uL (ref 4.0–10.5)

## 2013-09-20 LAB — CREATININE, SERUM
Creatinine, Ser: 0.71 mg/dL (ref 0.50–1.35)
GFR calc Af Amer: >90 mL/min (ref >90)
GFR calc non Af Amer: >90 mL/min (ref >90)

## 2013-09-20 MED ORDER — ACETAMINOPHEN 325 MG PO TABS
325.0000 mg | ORAL_TABLET | ORAL | Status: DC | PRN
  Administered 2013-09-26: 650 mg via ORAL
  Filled 2013-09-20: qty 2

## 2013-09-20 MED ORDER — SORBITOL 70 % SOLN
30.0000 mL | Freq: Every day | Status: DC | PRN
  Administered 2013-09-29 – 2013-10-05 (×2): 30 mL via ORAL
  Filled 2013-09-20 (×2): qty 30

## 2013-09-20 MED ORDER — ATORVASTATIN CALCIUM 80 MG PO TABS: 80.0000 mg | ORAL_TABLET | Freq: Every day | ORAL | Status: DC

## 2013-09-20 MED ORDER — INSULIN GLARGINE 100 UNIT/ML ~~LOC~~ SOLN
20.0000 [IU] | Freq: Every day | SUBCUTANEOUS | Status: DC
  Filled 2013-09-20: qty 0.2

## 2013-09-20 MED ORDER — DIVALPROEX SODIUM 500 MG PO DR TAB
1000.0000 mg | DELAYED_RELEASE_TABLET | Freq: Every day | ORAL | Status: DC
Start: 2013-09-20 — End: 2013-10-08
  Administered 2013-09-20 – 2013-10-07 (×18): 1000 mg via ORAL
  Filled 2013-09-20 (×19): qty 2

## 2013-09-20 MED ORDER — LISINOPRIL 10 MG PO TABS: 10.0000 mg | ORAL_TABLET | Freq: Every day | ORAL | Status: DC

## 2013-09-20 MED ORDER — TRAZODONE HCL 150 MG PO TABS
300.0000 mg | ORAL_TABLET | Freq: Every day | ORAL | Status: DC
  Administered 2013-09-20 – 2013-10-07 (×17): 300 mg via ORAL
  Filled 2013-09-20 (×19): qty 2

## 2013-09-20 MED ORDER — LISINOPRIL 10 MG PO TABS
10.0000 mg | ORAL_TABLET | Freq: Every day | ORAL | Status: DC
  Administered 2013-09-21 – 2013-10-08 (×18): 10 mg via ORAL
  Filled 2013-09-20 (×19): qty 1

## 2013-09-20 MED ORDER — CLOPIDOGREL BISULFATE 75 MG PO TABS: 75.0000 mg | ORAL_TABLET | Freq: Every day | ORAL | Status: DC

## 2013-09-20 MED ORDER — ASPIRIN EC 81 MG PO TBEC
81.0000 mg | DELAYED_RELEASE_TABLET | Freq: Every day | ORAL | Status: DC
  Filled 2013-09-20: qty 1

## 2013-09-20 MED ORDER — ENOXAPARIN SODIUM 40 MG/0.4ML ~~LOC~~ SOLN: 40.0000 mg | SUBCUTANEOUS | Status: DC

## 2013-09-20 MED ORDER — VENLAFAXINE HCL ER 150 MG PO CP24
150.0000 mg | ORAL_CAPSULE | Freq: Two times a day (BID) | ORAL | Status: DC
  Administered 2013-09-20 – 2013-09-23 (×7): 150 mg via ORAL
  Filled 2013-09-20 (×10): qty 1

## 2013-09-20 MED ORDER — LEVOTHYROXINE SODIUM 125 MCG PO TABS
125.0000 ug | ORAL_TABLET | Freq: Every day | ORAL | Status: DC
  Administered 2013-09-21 – 2013-10-08 (×18): 125 ug via ORAL
  Filled 2013-09-20 (×19): qty 1

## 2013-09-20 MED ORDER — CLOPIDOGREL BISULFATE 75 MG PO TABS
75.0000 mg | ORAL_TABLET | Freq: Every day | ORAL | Status: DC
  Administered 2013-09-21 – 2013-10-08 (×18): 75 mg via ORAL
  Filled 2013-09-20 (×19): qty 1

## 2013-09-20 MED ORDER — INSULIN GLARGINE 100 UNIT/ML ~~LOC~~ SOLN
20.0000 [IU] | Freq: Every day | SUBCUTANEOUS | Status: DC
  Administered 2013-09-20 – 2013-09-21 (×2): 20 [IU] via SUBCUTANEOUS
  Filled 2013-09-20 (×3): qty 0.2

## 2013-09-20 MED ORDER — ONDANSETRON HCL 4 MG/2ML IJ SOLN
4.0000 mg | Freq: Four times a day (QID) | INTRAMUSCULAR | Status: DC | PRN
  Administered 2013-09-26 (×2): 4 mg via INTRAVENOUS
  Filled 2013-09-20 (×2): qty 2

## 2013-09-20 MED ORDER — ONDANSETRON HCL 4 MG PO TABS: 4.0000 mg | ORAL_TABLET | Freq: Four times a day (QID) | ORAL | Status: DC | PRN

## 2013-09-20 MED ORDER — INSULIN GLARGINE 100 UNIT/ML ~~LOC~~ SOLN: 20.0000 [IU] | Freq: Every day | SUBCUTANEOUS | Status: DC

## 2013-09-20 MED ORDER — BUPROPION HCL ER (SR) 150 MG PO TB12
150.0000 mg | ORAL_TABLET | Freq: Every day | ORAL | Status: DC
  Administered 2013-09-21 – 2013-10-08 (×18): 150 mg via ORAL
  Filled 2013-09-20 (×19): qty 1

## 2013-09-20 MED ORDER — PANTOPRAZOLE SODIUM 40 MG PO TBEC
40.0000 mg | DELAYED_RELEASE_TABLET | Freq: Every day | ORAL | Status: DC
Start: 2013-09-21 — End: 2013-09-25
  Administered 2013-09-21 – 2013-09-25 (×5): 40 mg via ORAL
  Filled 2013-09-20 (×6): qty 1

## 2013-09-20 MED ORDER — BUSPIRONE HCL 5 MG PO TABS
5.0000 mg | ORAL_TABLET | Freq: Two times a day (BID) | ORAL | Status: DC | PRN
  Administered 2013-10-01: 5 mg via ORAL
  Filled 2013-09-20 (×2): qty 1

## 2013-09-20 MED ORDER — ATORVASTATIN CALCIUM 80 MG PO TABS
80.0000 mg | ORAL_TABLET | Freq: Every day | ORAL | Status: DC
  Administered 2013-09-21 – 2013-10-07 (×16): 80 mg via ORAL
  Filled 2013-09-20 (×19): qty 1

## 2013-09-20 MED ORDER — ENOXAPARIN SODIUM 40 MG/0.4ML ~~LOC~~ SOLN
40.0000 mg | SUBCUTANEOUS | Status: DC
  Administered 2013-09-20 – 2013-10-07 (×18): 40 mg via SUBCUTANEOUS
  Filled 2013-09-20 (×19): qty 0.4

## 2013-09-20 MED ORDER — INSULIN ASPART 100 UNIT/ML ~~LOC~~ SOLN
0.0000 [IU] | Freq: Three times a day (TID) | SUBCUTANEOUS | Status: DC
  Administered 2013-09-20: 5 [IU] via SUBCUTANEOUS
  Administered 2013-09-21: 3 [IU] via SUBCUTANEOUS
  Administered 2013-09-21 (×2): 5 [IU] via SUBCUTANEOUS
  Administered 2013-09-22: 8 [IU] via SUBCUTANEOUS
  Administered 2013-09-22: 5 [IU] via SUBCUTANEOUS
  Administered 2013-09-22: 11 [IU] via SUBCUTANEOUS
  Administered 2013-09-23: 3 [IU] via SUBCUTANEOUS
  Administered 2013-09-23: 5 [IU] via SUBCUTANEOUS
  Administered 2013-09-24: 11 [IU] via SUBCUTANEOUS
  Administered 2013-09-24: 8 [IU] via SUBCUTANEOUS
  Administered 2013-09-25: 3 [IU] via SUBCUTANEOUS
  Administered 2013-09-25: 5 [IU] via SUBCUTANEOUS
  Administered 2013-09-25: 8 [IU] via SUBCUTANEOUS
  Administered 2013-09-26 (×3): 5 [IU] via SUBCUTANEOUS
  Administered 2013-09-27: 3 [IU] via SUBCUTANEOUS
  Administered 2013-09-27: 5 [IU] via SUBCUTANEOUS
  Administered 2013-09-28: 3 [IU] via SUBCUTANEOUS
  Administered 2013-09-28: 8 [IU] via SUBCUTANEOUS
  Administered 2013-09-28: 3 [IU] via SUBCUTANEOUS
  Administered 2013-09-29: 5 [IU] via SUBCUTANEOUS
  Administered 2013-09-29 (×2): 3 [IU] via SUBCUTANEOUS
  Administered 2013-09-30: 5 [IU] via SUBCUTANEOUS
  Administered 2013-09-30: 8 [IU] via SUBCUTANEOUS
  Administered 2013-09-30 – 2013-10-01 (×2): 3 [IU] via SUBCUTANEOUS
  Administered 2013-10-01: 2 [IU] via SUBCUTANEOUS
  Administered 2013-10-01: 3 [IU] via SUBCUTANEOUS
  Administered 2013-10-02: 5 [IU] via SUBCUTANEOUS
  Administered 2013-10-02 – 2013-10-03 (×3): 3 [IU] via SUBCUTANEOUS
  Administered 2013-10-03: 5 [IU] via SUBCUTANEOUS
  Administered 2013-10-03 – 2013-10-04 (×2): 3 [IU] via SUBCUTANEOUS
  Administered 2013-10-04: 5 [IU] via SUBCUTANEOUS
  Administered 2013-10-04: 3 [IU] via SUBCUTANEOUS
  Administered 2013-10-05: 5 [IU] via SUBCUTANEOUS
  Administered 2013-10-05 – 2013-10-06 (×3): 3 [IU] via SUBCUTANEOUS
  Administered 2013-10-06: 2 [IU] via SUBCUTANEOUS
  Administered 2013-10-06 – 2013-10-07 (×2): 5 [IU] via SUBCUTANEOUS
  Administered 2013-10-07: 3 [IU] via SUBCUTANEOUS
  Administered 2013-10-07: 5 [IU] via SUBCUTANEOUS
  Administered 2013-10-08: 2 [IU] via SUBCUTANEOUS

## 2013-09-20 NOTE — Progress Notes (Signed)
Inpatient Diabetes Program Recommendations  AACE/ADA: New Consensus Statement on Inpatient Glycemic Control (2013)  Target Ranges:  Prepandial:   less than 140 mg/dL      Peak postprandial:   less than 180 mg/dL (1-2 hours)      Critically ill patients:  140 - 180 mg/dL     Results for Brad Taylor, Brad Taylor (MRN [id number]) as of 09/20/2013 08:17  Ref. Range 09/19/2013 07:28 09/19/2013 11:58 09/19/2013 16:37 09/19/2013 22:04  Glucose-Capillary Latest Range: 70-99 mg/dL 186 (H) 230 (H) 203 (H) 160 (H)    Results for Brad Taylor, Brad Taylor (MRN [id number]) as of 09/20/2013 08:17  Ref. Range 09/16/2013 04:15  Hemoglobin A1C Latest Range: <5.7 % 7.4 (H)    Diabetes history: Type 2 DM Outpatient Diabetes medications: Lantus 60 units in the AM/ 65 units in the PM + Glipizide 10 mg QD Current orders for Inpatient glycemic control: Novolog Moderate SSI tid   **PO intake only 30-50% of meals.   **MD- Please consider adding a small portion of patient's home Lantus dose back to regimen- Lantus 20 units QHS   Will follow. Wyn Quaker RN, MSN, CDE Diabetes Coordinator Inpatient Diabetes Program Team Pager: [phone] (8a-10p)

## 2013-09-20 NOTE — H&P (Signed)
Physical Medicine and Rehabilitation Admission H&P  Chief Complaint   Patient presents with   .  Code Stroke   :  Chief complaint: Weakness  HPI: DANE KOPKE is a 63 y.o. male with history of COPD, diabetes mellitus and peripheral neuropathy, PTSD. Admitted 09/15/2013 with right-sided weakness and slurred speech. MRI of the brain showed acute ischemic infarct involving the posterior limb of the left internal capsule as well as additional subcentimeter ischemic infarct deep white matter of the left centrum semiovale and remote lacunar infarct bilateral basal ganglia. MRA of the head with no stenosis or aneurysm. Echocardiogram is pending. Carotid Dopplers with no ICA stenosis. Patient did not receive TPA. Neurology services consulted placed on aspirin/Plavix for CVA prophylaxis as well as subcutaneous Lovenox for DVT prophylaxis. Maintained on a regular consistency diet. Followup wound care nurse for left lateral lower extremity ulcer with dressing changes as advised with Unna boot.  Physical and occupational therapy evaluations completed 09/16/2013 with recommendations of physical medicine rehabilitation consult to consider inpatient rehabilitation services. Patient was felt to be a good candidate for inpatient rehabilitation services and was admitted for comprehensive rehabilitation program  ROS Review of Systems  Gastrointestinal: Positive for constipation.  GERD  Musculoskeletal: Positive for myalgias.  Psychiatric/Behavioral: Positive for depression.  PTSD  All other systems reviewed and are negative  Past Medical History   Diagnosis  Date   .  COPD (chronic obstructive pulmonary disease)    .  Diabetes mellitus without complication    .  Hypothyroidism    .  GERD (gastroesophageal reflux disease)    .  Ulcer    .  Post traumatic stress disorder    .  Depression    .  Hyperlipidemia     Past Surgical History   Procedure  Laterality  Date   .  Cholecystectomy     .  Cataract  extraction     .  Knee surgery      History reviewed. No pertinent family history.  Social History: reports that he has been smoking Cigarettes. He has been smoking about 1.00 pack per day. He quit smokeless tobacco use about 4 months ago. He reports that he does not drink alcohol or use illicit drugs.  Allergies: Not on File  Medications Prior to Admission   Medication  Sig  Dispense  Refill   .  aspirin 81 MG tablet  Take 81 mg by mouth daily.     Marland Kitchen  buPROPion (WELLBUTRIN SR) 150 MG 12 hr tablet  Take 150 mg by mouth daily.     .  busPIRone (BUSPAR) 10 MG tablet  Take 5 mg by mouth 2 (two) times daily as needed (for anxiety).     Marland Kitchen  divalproex (DEPAKOTE) 500 MG DR tablet  Take 1,000 mg by mouth at bedtime.     Marland Kitchen  etodolac (LODINE) 400 MG tablet  Take 400 mg by mouth 2 (two) times daily.     Marland Kitchen  glipiZIDE (GLUCOTROL) 5 MG tablet  Take 10 mg by mouth daily before breakfast.     .  insulin glargine (LANTUS) 100 UNIT/ML injection  Inject 60-65 Units into the skin 2 (two) times daily. 60 units in am, 65 units in pm     .  levothyroxine (SYNTHROID, LEVOTHROID) 125 MCG tablet  Take 125 mcg by mouth daily before breakfast.     .  RABEprazole (ACIPHEX) 20 MG tablet  Take 40 mg by mouth daily before breakfast.     .  rosuvastatin (CRESTOR) 20 MG tablet  Take 10 mg by mouth at bedtime.     Cathie Olden Nitrate SOLN  1 application by Does not apply route every other day.     .  terazosin (HYTRIN) 10 MG capsule  Take 10 mg by mouth at bedtime.     .  traZODone (DESYREL) 100 MG tablet  Take 300 mg by mouth at bedtime.     Marland Kitchen  venlafaxine XR (EFFEXOR-XR) 150 MG 24 hr capsule  Take 150 mg by mouth 2 (two) times daily.      Home:  Home Living  Family/patient expects to be discharged to:: Private residence  Living Arrangements: Spouse/significant other  Available Help at Discharge: Family;Available 24 hours/day  Type of Home: House  Home Access: Stairs to enter  [employer] of Steps: 3  Entrance  Stairs-Rails: Right  Home Layout: One level  Home Equipment: Cane - single point  Lives With: Spouse  Functional History:  Prior Function  Comments: Pt uses cane for distance walking PTA.  Functional Status:  Mobility:  Mod assist  Ambulation/Gait  Ambulation Distance (Feet): 200 Feet  Gait velocity: decreased  General Gait Details: Pt with continued staggering gait pattern. Son came with [place] for safety due to significant balance deficits during gait. Pt is not safe to use cane without two people assisting him as he loses his balance and has a tough time recovering from that LOB. We will need to try RW next session, but he may need assist to keep his right hand on the RW due to decreased strength and coordination on that side.   ADL:  ADL  Grooming: Performed;Wash/dry face;Supervision/safety (using Left hand)  Where Assessed - Grooming: Unsupported sitting  Upper Body Bathing: Simulated;Minimal assistance  Where Assessed - Upper Body Bathing: Unsupported sitting  Lower Body Bathing: Simulated;Moderate assistance  Where Assessed - Lower Body Bathing: Unsupported sitting  Upper Body Dressing: Performed;Moderate assistance  Where Assessed - Upper Body Dressing: Unsupported sitting  Lower Body Dressing: Performed;Maximal assistance (donned socks)  Where Assessed - Lower Body Dressing: Unsupported sitting  ADL Comments: Pt sat EOB >10 min. at supervision level. Educated pt and wife on repeatedly attempting to use RUE when reaching for objects such as to touch wife's hand.  Cognition:  Cognition  Overall Cognitive Status: Impaired/Different from baseline  Arousal/Alertness: Awake/alert  Orientation Level: Oriented X4  Cognition  Arousal/Alertness: Awake/alert  Behavior During Therapy: WFL for tasks assessed/performed  Overall Cognitive Status: Impaired/Different from baseline  Area of Impairment: Safety/judgement  Safety/Judgement: Decreased awareness of safety;Decreased awareness of  deficits  Awareness: Emergent  General Comments: Pt knows he is unsteady on his feet, but cannot anticipate how to adjust what he is doing to be safer becaus of it (i.e. walking slower).  Difficult to assess due to: Impaired communication    Physical Exam:  Blood pressure 153/79, pulse 68, temperature 97.3 F (36.3 C), temperature source Oral, resp. rate 18, height 6' (1.829 m), weight 122.9 kg (270 lb 15.1 oz), SpO2 94.00%.  Constitutional:  63 year old obese male  Eyes: EOM are normal.  Neck: Normal range of motion. Neck supple. No thyromegaly present.  Cardiovascular: Normal rate and regular rhythm.  Respiratory:  Lungs decreased breath sounds at the bases but clear to auscultation  GI: Soft. Bowel sounds are normal. He exhibits no distension. There is no tenderness. There is no rebound.  Neurological: He is alert.  Skin .left lateral lower extremity ulcer with dressing in place /  unna boot /without odor  Speech is dysarthric but intelligible. He was able to name person, date of birth needing some cueing for place. RUE is tr deltoid, bicep,  tricep, wrist, 2+ hand. RLE is 3/5 HF, KE, ankle 3-. Sensory exam intact to PP and LT on the right. Right facial droop, and tongue deviation. Still delayed in processing at times, some apraxia. More attentive. Generally cooperative  Skin: Skin is warm and dry.  Psychiatric: He has a normal mood and affect    Results for orders placed during the hospital encounter of 09/15/13 (from the past 48 hour(s))   GLUCOSE, CAPILLARY Status: Abnormal    Collection Time    09/18/13 11:34 AM   Result  Value  Ref Range    Glucose-Capillary  181 (*)  70 - 99 mg/dL    Comment 1  Documented in Chart     Comment 2  Notify RN    GLUCOSE, CAPILLARY Status: Abnormal    Collection Time    09/18/13 4:32 PM   Result  Value  Ref Range    Glucose-Capillary  219 (*)  70 - 99 mg/dL    Comment 1  Documented in Chart     Comment 2  Notify RN    GLUCOSE, CAPILLARY  Status: Abnormal    Collection Time    09/18/13 8:39 PM   Result  Value  Ref Range    Glucose-Capillary  174 (*)  70 - 99 mg/dL   GLUCOSE, CAPILLARY Status: Abnormal    Collection Time    09/19/13 7:28 AM   Result  Value  Ref Range    Glucose-Capillary  186 (*)  70 - 99 mg/dL   GLUCOSE, CAPILLARY Status: Abnormal    Collection Time    09/19/13 11:58 AM   Result  Value  Ref Range    Glucose-Capillary  230 (*)  70 - 99 mg/dL    Comment 1  Notify RN    GLUCOSE, CAPILLARY Status: Abnormal    Collection Time    09/19/13 4:37 PM   Result  Value  Ref Range    Glucose-Capillary  203 (*)  70 - 99 mg/dL   GLUCOSE, CAPILLARY Status: Abnormal    Collection Time    09/19/13 10:04 PM   Result  Value  Ref Range    Glucose-Capillary  160 (*)  70 - 99 mg/dL   GLUCOSE, CAPILLARY Status: Abnormal    Collection Time    09/20/13 8:09 AM   Result  Value  Ref Range    Glucose-Capillary  207 (*)  70 - 99 mg/dL    Ct Head Wo Contrast  09/19/2013 CLINICAL DATA: Worsening right upper extremity weakness and slurred speech. EXAM: CT HEAD WITHOUT CONTRAST TECHNIQUE: Contiguous axial images were obtained from the base of the skull through the vertex without intravenous contrast. COMPARISON: 09/15/2013 and MRI brain 09/16/2013 FINDINGS: Ventricles, cisterns and other CSF spaces are within normal. There is chronic ischemic microvascular disease. There is an old lacunar infarct over the head of the left caudate nucleus unchanged. There is evidence of patient's known acute infarct over the posterior limb of the left internal capsule measuring approximately 1.2 x 1.6 cm. There is no mass, mass effect, shift of midline structures or acute hemorrhage. Remainder of the exam is unchanged to include sinus inflammatory disease. IMPRESSION: Evidence of patient's known ovoid acute infarct over the posterior limb of the left internal capsule measuring approximately 1.2 x 1.6 cm. Chronic ischemic microvascular disease.  Old small  lacunar infarct of the left caudate. Electronically Signed By: Marin Olp M.D. On: 09/19/2013 10:52   Post Admission Physician Evaluation:  1. Functional deficits secondary to embolic left internal capsule/deep white matter infarct . 2. Patient is admitted to receive collaborative, interdisciplinary care between the physiatrist, rehab nursing staff, and therapy team. 3. Patient's level of medical complexity and substantial therapy needs in context of that medical necessity cannot be provided at a lesser intensity of care such as a SNF. 4. Patient has experienced substantial functional loss from his/her baseline which was documented above under the "Functional History" and "Functional Status" headings. Judging by the patient's diagnosis, physical exam, and functional history, the patient has potential for functional progress which will result in measurable gains while on inpatient rehab. These gains will be of substantial and practical use upon discharge in facilitating mobility and self-care at the household level. 5. Physiatrist will provide 24 hour management of medical needs as well as oversight of the therapy plan/treatment and provide guidance as appropriate regarding the interaction of the two. 6. 24 hour rehab nursing will assist with bladder management, bowel management, safety, skin/wound care, disease management, medication administration, pain management and patient education and help integrate therapy concepts, techniques,education, etc. 7. PT will assess and treat for/with: Lower extremity strength, range of motion, stamina, balance, functional mobility, safety, adaptive techniques and equipment, NMR, pain mgt, education. Goals are: supervision. 8. OT will assess and treat for/with: ADL's, functional mobility, safety, upper extremity strength, adaptive techniques and equipment, NMR, education. Goals are: supervision to min assist. 9. SLP will assess and treat for/with: speech  intelligibility, communication, cognition. Goals are: supervision to mod I. 10. Case Management and Social Worker will assess and treat for psychological issues and discharge planning. 11. Team conference will be held weekly to assess progress toward goals and to determine barriers to discharge. 12. Patient will receive at least 3 hours of therapy per day at least 5 days per week. 13. ELOS: 14-18 days  14. Prognosis: excellent   Medical Problem List and Plan:  1. Left internal capsule/deep white matter infarcts felt to be embolic secondary to unknown source  2. DVT Prophylaxis/Anticoagulation: Subcutaneous Lovenox. Monitor platelet counts and any signs of bleeding  3. Pain Management: Tylenol as needed  4. Mood/PTSD/depression. Wellbutrin 150 mg daily, BuSpar 5 mg twice a day as needed, Depakote 1000 mg each bedtime, effexor 150 mg twice a day and trazodone 300 mg each bedtime  5. Neuropsych: This patient is not yet capable of making decisions on his own behalf.  6. Diabetes mellitus with peripheral neuropathy. Hemoglobin A1c 7.4. Currently on sliding scale insulin. Patient on Glucotrol XL 10 mg daily. Check blood sugars a.c. and at bedtime resume oral agents as directed  7. Hypertension. Lisinopril 10 mg day. Monitor with increased activity  8. Hypothyroidism. Synthroid .TSH 4.045  9. Hyperlipidemia. Lipitor  10. GERD. Protonix  11. Left lateral lower extremity ulcer. Followup wound care nurse with dressing changes as directed/Unna boot   Meredith Staggers, MD, [place] [facility]   09/20/2013

## 2013-09-20 NOTE — Discharge Instructions (Addendum)
sTROKE/TIA DISCHARGE INSTRUCTIONS SMOKING Cigarette smoking nearly doubles your risk of having a stroke & is the single most alterable risk factor  If you smoke or have smoked in the last 12 months, you are advised to quit smoking for your health.  Most of the excess cardiovascular risk related to smoking disappears within a year of stopping.  Ask you doctor about anti-smoking medications  [place] Quit Line: 1-800-QUIT NOW  Free Smoking Cessation Classes [phone]  CHOLESTEROL Know your levels; limit fat & cholesterol in your diet  Lipid Panel     Component Value Date/Time   CHOL 216* 09/16/2013 0415   TRIG 288* 09/16/2013 0415   HDL 35* 09/16/2013 0415   CHOLHDL 6.2 09/16/2013 0415   VLDL 58* 09/16/2013 0415   [place] 123* 09/16/2013 0415      Many patients benefit from treatment even if their cholesterol is at goal.  Goal: Total Cholesterol (CHOL) less than 160  Goal:  Triglycerides (TRIG) less than 150  Goal:  HDL greater than 40  Goal:  LDL (LDLCALC) less than 100   BLOOD PRESSURE American Stroke Association blood pressure target is less that 120/80 mm/Hg  Your discharge blood pressure is:  BP: 132/71 mmHg  Monitor your blood pressure  Limit your salt and alcohol intake  Many individuals will require more than one medication for high blood pressure  DIABETES (A1c is a blood sugar average for last 3 months) Goal HGBA1c is under 7% (HBGA1c is blood sugar average for last 3 months)  Diabetes: Diagnosis of diabetes:  Your A1c:7.4 %    Lab Results  Component Value Date   HGBA1C 7.4* 09/16/2013     Your HGBA1c can be lowered with medications, healthy diet, and exercise.  Check your blood sugar as directed by your physician  Call your physician if you experience unexplained or low blood sugars.  PHYSICAL ACTIVITY/REHABILITATION Goal is 30 minutes at least 4 days per week  Activity: No driving, Therapies: Physical Therapy: Inpatient Rehab Unit at [facility], Occupational  Therapy: Inpatient Rehab Unit at [facility] and Speech Therapy: Inpatient Rehab Unit at [facility] and Speech Therapy Return to work:   Activity decreases your risk of heart attack and stroke and makes your heart stronger.  It helps control your weight and blood pressure; helps you relax and can improve your mood.  Participate in a regular exercise program.  Talk with your doctor about the best form of exercise for you (dancing, walking, swimming, cycling).  DIET/WEIGHT Goal is to maintain a healthy weight  Your discharge diet is: Carb Control  Your height is:  Height: 6' (182.9 cm) Your current weight is: Weight: 122.9 kg (270 lb 15.1 oz) Your Body Mass Index (BMI) is:  BMI (Calculated): 36.8  Following the type of diet specifically designed for you will help prevent another stroke.  Your goal weight range is:    Your goal Body Mass Index (BMI) is 19-24.  Healthy food habits can help reduce 3 risk factors for stroke:  High cholesterol, hypertension, and excess weight.  RESOURCES Stroke/Support Group:  Call [phone]   STROKE EDUCATION PROVIDED/REVIEWED AND GIVEN TO PATIENT Stroke warning signs and symptoms How to activate emergency medical system (call 911). Medications prescribed at discharge. Need for follow-up after discharge. Personal risk factors for stroke. Pneumonia vaccine given: No Flu vaccine given: No My questions have been answered, the writing is legible, and I understand these instructions.  I will adhere to these goals & educational materials  that have been provided to me after my discharge from the hospital.

## 2013-09-20 NOTE — Progress Notes (Signed)
Family Medicine Teaching Service Daily Progress Note Intern Pager: [phone]  Patient name: Brad Taylor Medical record number: [id number] Date of birth: 1950/11/18 Age: 63 y.o. Gender: male  Primary Care Provider: Odette Fraction, MD Consultants: Neurology Code Status: Full  Pt Overview and Major Events to Date:  2/11 - Admitted code stroke, Head CT neg, not tPA candidate, NIH 4, ASA, work-up 2/12 - switch to ASA 81 + Plavix 75, MRI/MRA (acute L-CVA), carotids (neg), PT/OT/SLP 2/13 - CIR agrees with admission (pending acceptance / ins clearance), ECHO (nml EF, no embolic etiology) 7/16 - cont PT/OT/SLP, pending CIR placement 2/15 - Worsening RUE weakness, repeat CT scan (negative) 2/16 - Persistent residual deficits (RUE, dysarthria), accepted for admission to CIR today  Assessment and Plan: AQUILES RUFFINI is a 63 y.o. male presenting with right facial droop, slurred speech, and right upper extremity weakness concerning for CVA. PMH is significant for COPD, DM-2, Hypothyroidism, GERD, Depression, PTSD, and HLD.  # Acute Stroke (L-posterior limb int capsule), residual symptoms (RUE weakness, dysarthria) Admitted code stroke, no prior h/o CVA, previously on home ASA 81 daily, initial work-up with  Head CT negative for acute bleed / infarct, not tPA candidate. MRI (acute ischemic infarct), Repeat Head CT (2/15) without evidence of extension or hemorrhage - telemetry, neuro checks - Antiplatelet therapy - DAPT with ASA 81 + Plavix 75mg  daily (2/12>>), expect for DAPT x 21 days, then switch to Plavix only (on 10/07/13) - PT/SLP consults --> recommend CIR, screened for appropriateness. Consulted CIR (see details under disposition) - clinically residual deficits persistent without improvement today - PT to see today - CIR accepted for admission to rehab today (09/20/13)  # Type 2 DM, well controlled - HgbA1c 7.4 - resume low dose home Lantus 20u daily (vs high dose 60u BID at home  without fast acting) - SSI while inpatient   # Concern for possible PAF - Stable - In the ED, patient appeared to be in atrial fibrillation on the cardiac monitor, concern for potential PAF that may have triggered CVA - currently NSR - Recommend outpatient follow-up for event / rhythm monitor to capture potential PAF as etiology  # Mildly Elevated BP - Stable  No prior h/o dx HTN, allowed permissive HTN 24 hrs after CVA - Stable BP 130/84 - continue Lisinopril 10mg  daily (no prior ACEi, given h/o DM2)  # Hypothyroidism  - Continuing home Synthroid  # GERD  - Continuing PPI (using formulary protonix)  # Psych: Depression/PTSD  - Continuing home medications as patient has been on them for years: Trazodone, Divalproex, BuSpar, Bupropion, Venlafaxine.  # HLD  - prior home dose Crestor 20mg  daily - Atorvastatin 80mg  daily  # COPD - Stable - On home albuterol PRN, continue if needed   # Lower Ext ulcerations, secondary to h/o DM2 - evidence of previously healed RLE ulcer - LLE ulcer managed by WOC, recommended unna boot, previously dressed, recommend weekly changing  FEN/GI: SL IV, Carb modified diet  Prophylaxis: Lovenox  Disposition: Continue to clinically monitor status post acute CVA on admission, PT/OT recommend CIR, CIR has accepted and approved patient, confirmed with CIR that bed available and accepted to be admitted today 09/20/13 and discharged from [facility].  Subjective: No acute events overnight. Patient and family complain of persistent RUE weakness that seems to have gotten worsen over the weekend, now unable to lift Right arm off of bed or grip, persistent slurred speech. Otherwise no new neurological deficits. Tolerating regular diet, but decreased  appetite. Per patient, PT has not come by to work with patient over the weekend. They are requesting to know status of placement today, as he needs rehab.  Objective: Temp:  [97.3 F (36.3 C)-98.4 F (36.9 C)] 97.7 F (36.5  C) (02/16 1140) Pulse Rate:  [62-76] 70 (02/16 1140) Resp:  [18-20] 20 (02/16 1140) BP: (129-153)/(67-94) 132/71 mmHg (02/16 1140) SpO2:  [94 %-97 %] 97 % (02/16 1140)  Physical Exam: General: obese male, laying in bed, NAD.  HEENT: PERRL, EOMI, MMM.  Cardiovascular: RRR, 2/6 systolic murmur heard.  Respiratory: CTA B/L  Abdomen: obese, soft, NTND. Extremities: Trace LE edema with hyperpigmentation consistent with chronic venous insufficiency Neuro: AO x 3. Persistent dysarthria. CN 2-12 grossly intact.  RUE strength 1-2/5 (continued decline), otherwise all 3 other ext 5/5 muscle strength  Laboratory:  Recent Labs Lab 09/15/13 1833 09/15/13 1845  WBC 10.2  --   HGB 15.9 17.0  HCT 45.3 50.0  PLT 175  --     Recent Labs Lab 09/15/13 1833 09/15/13 1845  NA 137 135*  K 5.0 4.6  CL 95* 95*  CO2 30  --   BUN 10 9  CREATININE 0.83 1.00  CALCIUM 9.2  --   PROT 7.0  --   BILITOT 0.3  --   ALKPHOS 64  --   ALT 12  --   AST 13  --   GLUCOSE 125* 124*   Hb A1C (7.4) TSH (4.045) Lipid panel (TC 216 / TG 288 / HDL 35 / LDL 123) Troponin-I - negative UA - negative UTox - negative  Imaging/Diagnostic Tests:   2/12 MRI Brain / MRA Head IMPRESSION:  MRI HEAD:  1. Acute ischemic infarct involving the posterior limb of the left  internal capsule.   MRA HEAD:  1. Mild multi focal irregularity within the cavernous ICAs  bilaterally, left greater than right, likely related to underlying  atherosclerotic disease. No high-grade flow-limiting stenosis or  proximal branch arterial occlusion identified.  2. No intracranial aneurysm.  2/13 ECHO - Left ventricle: The cavity size was normal. Systolic function was normal. The estimated ejection fraction was in the range of 60% to 65%. Wall motion was normal; there were no regional wall motion abnormalities. - Left atrium: The atrium was moderately dilated. - Atrial septum: No defect or patent foramen ovale  was Identified.  2/15 Head CT w/o contrast (repeat) IMPRESSION:  Evidence of patient's known ovoid acute infarct over the posterior  limb of the left internal capsule measuring approximately 1.2 x 1.6  cm.  Chronic ischemic microvascular disease. Old small lacunar infarct of  the left caudate.   Nobie Putnam, DO 09/20/2013, 12:06 PM PGY-1, [place] Intern pager: [phone], text pages welcome

## 2013-09-20 NOTE — Progress Notes (Signed)
Attending Addendum  I examined the patient and discussed the assessment and plan with Dr. Parks Ranger. I have reviewed the note and agree.  Patient is medically stable for d/c to CIR today.     Boykin Nearing, [place]

## 2013-09-20 NOTE — Discharge Summary (Signed)
Attending Addendum  I examined the patient and discussed the discharge plan with Dr. Karamalegos. I have reviewed the note and agree.    Ascencion Coye, MD FAMILY MEDICINE TEACHING SERVICE   

## 2013-09-20 NOTE — Progress Notes (Signed)
Rehab admissions - I have authorization for acute inpatient rehab admission.  Bed available and can admit to inpatient rehab today.  Call me for questions.  #[phone]

## 2013-09-20 NOTE — Progress Notes (Signed)
Physical Therapy Treatment Patient Details Name: Brad Taylor MRN: [id number] DOB: [birth year] Today's Date: 09/20/2013 Time: 2703-5009 PT Time Calculation (min): 17 min  PT Assessment / Plan / Recommendation  History of Present Illness 63 y.o. male admitted to [facility] on 09/15/13 with past medical history of COPD, insulin-dependent type 2 diabetes, hypothyroidism, depression, PTSD, and hyperlipidemia presents with right upper extremity weakness, right facial droop, and dysarthria.  CT negative. MRI reveals Acute ischemic infarct involving the posterior limb of the left internal capsule.  Additional subcentimeter ischemic infarct within the deep white matter of the left centrum semi ovale.  Remote subcentimeter lacunar infarcts involving the bilateral basal ganglia bilaterally.   PT Comments   Pt with significant changes in both right upper and right lower extremity strength today compared to Friday's session.  Per resident, repeat CT does not indicate that there was any extension of the stroke.  Pt with no grip strength today where as there was a weak, but present strength on Friday.  His right leg is also weaker showing decreased coordination and decreased strength to progress leg forward during gait.  He is now a heavy two person assist to attempt gait safely.  He continues to be appropriate for CIR level therapies despite this decrease in his function and actually makes him even more so appropriate at this time.    Follow Up Recommendations  CIR     Does the patient have the potential to tolerate intense rehabilitation    NA  Barriers to Discharge   Wife is unable to handle him safely at his current level of mobility      Equipment Recommendations  Rolling walker with 5" wheels;Other (comment);Wheelchair (measurements PT);Wheelchair cushion (measurements PT) (right platform)    Recommendations for Other Services   NA  Frequency Min 4X/week   Progress towards PT Goals Progress towards PT  goals: Not progressing toward goals - comment (weaker on the right side today, requiring two person to walk)  Plan Current plan remains appropriate    Precautions / Restrictions Precautions Precautions: Fall Precaution Comments: right sided weakness.    Pertinent Vitals/Pain See vitals flow sheet.     Mobility  Transfers Overall transfer level: Needs assistance Equipment used: Rolling walker (2 wheeled) Transfers: Sit to/from Stand Sit to Stand: +2 physical assistance;Mod assist General transfer comment: two person mod assist to get to standing from low recliner chair.  Pt needed verbal cues and manual assist to make sure his right leg was underneath him during the transition.  He needed support at his trunk for balance and support over weak legs.   Ambulation/Gait Ambulation/Gait assistance: +2 physical assistance;Max assist Ambulation Distance (Feet): 70 Feet Assistive device: Rolling walker (2 wheeled) Gait Pattern/deviations: Step-to pattern;Decreased step length - left;Decreased dorsiflexion - right;Decreased weight shift to left;Steppage;Narrow base of support Gait velocity: decreased Gait velocity interpretation: <1.8 ft/sec, indicative of risk for recurrent falls General Gait Details: Pt requiring significantly more assistance with gait today than last session.  He presents like his stoke has extended.  He has significantly more lean to the right, decreased control of his right leg and decreased strength to move his right leg forward.  He also is unable to move his right arm at all today (he had much more movement of it on Friday, hence trying to use the RW today).        PT Goals (current goals can now be found in the care plan section) Acute Rehab PT Goals Patient Stated Goal:  to go home  Visit Information  Last PT Received On: 09/20/13 Assistance Needed: +2 History of Present Illness: 63 y.o. male admitted to [facility] on 09/15/13 with past medical history of COPD,  insulin-dependent type 2 diabetes, hypothyroidism, depression, PTSD, and hyperlipidemia presents with right upper extremity weakness, right facial droop, and dysarthria.  CT negative. MRI reveals Acute ischemic infarct involving the posterior limb of the left internal capsule.  Additional subcentimeter ischemic infarct within the deep white matter of the left centrum semi ovale.  Remote subcentimeter lacunar infarcts involving the bilateral basal ganglia bilaterally.    Subjective Data  Subjective: Pt/family frustrated that he did not get any therapy over the weekend.  Patient Stated Goal: to go home   Cognition  Cognition Arousal/Alertness: Awake/alert Behavior During Therapy: WFL for tasks assessed/performed Overall Cognitive Status: Impaired/Different from baseline Area of Impairment: Safety/judgement;Awareness Safety/Judgement: Decreased awareness of safety;Decreased awareness of deficits Awareness: Emergent General Comments: Pt is aware that he is leaning to the right during gait, but he is unable to anticipate unsafe situations (like lifting up the walker or moving his body before his feet are ready.      Balance  Balance Overall balance assessment: Needs assistance Standing balance support: Bilateral upper extremity supported Standing balance-Leahy Scale: Zero General Comments General comments (skin integrity, edema, etc.): Resident in hallway after session and I voiced my concerns that pt had extended his stroke given the significant difference in his appearace today compared to Friday (sister in room believes it is because he did not walk this weekend, but she does report he has been up in the chair all weekend).  Per resident repoeat CT has been done and there are no signs of extension of the stroke.   End of Session PT - End of Session Equipment Utilized During Treatment: Gait belt Activity Tolerance: Patient limited by fatigue Patient left: in chair;with call bell/phone within  reach;with family/visitor present    Wells Guiles B. Justice, [place], DPT [phone]   09/20/2013, 3:08 PM

## 2013-09-20 NOTE — Progress Notes (Signed)
Rehab admissions - Evaluated for possible admission.  I faxed information to [facility] Friday afternoon and again this am.  I have left a message with the case manager requesting acute inpatient rehab admission for today.  I will update all once I hear back from case manager.  Call me for questions.  #[phone]

## 2013-09-20 NOTE — Care Management Note (Signed)
    Page 1 of 1   09/20/2013     12:41:23 PM   CARE MANAGEMENT NOTE 09/20/2013  Patient:  CAHLIL, SATTAR   Account Number:  [id number]  Date Initiated:  09/20/2013  Documentation initiated by:  GRAVES-BIGELOW,Evolette Pendell  Subjective/Objective Assessment:   Pt admitted for MRI (acute ischemic infarct. Plan for d/c to CIR today.     Action/Plan:   No further needs from CM at this time.   Anticipated DC Date:  09/20/2013   Anticipated DC Plan:  IP REHAB FACILITY      DC Planning Services  CM consult      Choice offered to / List presented to:             Status of service:  Completed, signed off Medicare Important Message given?   (If response is "NO", the following Medicare IM given date fields will be blank) Date Medicare IM given:   Date Additional Medicare IM given:    Discharge Disposition:  IP REHAB FACILITY  Per UR Regulation:  Reviewed for med. necessity/level of care/duration of stay  If discussed at [place] of Stay Meetings, dates discussed:   09/21/2013    Comments:

## 2013-09-20 NOTE — Progress Notes (Signed)
Stroke Team Progress Note  HISTORY Brad Taylor is a 63 y.o. male who awakened 09/15/2013 and was not feeling well. His blood sugar was elevated at 180 and he decided to go back to sleep. When he awakened later he noted that he was stumbling as he tried to get to the bathroom so after using the bathroom he went back to bed. When he got out of bed later he was having some slurred speech and was complaining that his right arm did not feel right. He thought that he slept on it wrong. When his son came home at 5:30 he noted that his face was drooping, speech was very slurred and he could not use his right arm well. EMS was called at that time and the patient was brought in as a code stroke.   Date last known well: Date: 09/15/2013  Time last known well: Time: 14:00  tPA Given: No: Outside time window   SUBJECTIVE His wife and sister are at the bedside. Patient sitting up in a chair at the bedside. Plans are for transfer to rehab today.  OBJECTIVE Most recent Vital Signs: Filed Vitals:   09/20/13 0100 09/20/13 0529 09/20/13 0757 09/20/13 1140  BP: 130/86 141/90 153/79 132/71  Pulse: 76 66 68 70  Temp: 97.7 F (36.5 C) 97.9 F (36.6 C) 97.3 F (36.3 C) 97.7 F (36.5 C)  TempSrc:   Oral Oral  Resp: 18 18 18 20   Height:      Weight:      SpO2: 95% 96% 94% 97%   CBG (last 3)   Recent Labs  09/19/13 2204 09/20/13 0809 09/20/13 1137  GLUCAP 160* 207* 185*    IV Fluid Intake:     MEDICATIONS  . aspirin EC  81 mg Oral Daily  . atorvastatin  80 mg Oral q1800  . buPROPion  150 mg Oral Daily  . clopidogrel  75 mg Oral Q breakfast  . divalproex  1,000 mg Oral QHS  . enoxaparin (LOVENOX) injection  40 mg Subcutaneous Q24H  . insulin aspart  0-15 Units Subcutaneous TID WC  . insulin glargine  20 Units Subcutaneous QHS  . levothyroxine  125 mcg Oral QAC breakfast  . lisinopril  10 mg Oral Daily  . pantoprazole  40 mg Oral Daily  . sodium chloride  3 mL Intravenous Q12H  .  traZODone  300 mg Oral QHS  . venlafaxine XR  150 mg Oral BID   PRN:  busPIRone, sodium chloride  Diet:  Carb Control thin liquids Activity:  Up with assistance DVT Prophylaxis:  Lovenox  CLINICALLY SIGNIFICANT STUDIES Basic Metabolic Panel:   Recent Labs Lab 09/15/13 1833 09/15/13 1845  NA 137 135*  K 5.0 4.6  CL 95* 95*  CO2 30  --   GLUCOSE 125* 124*  BUN 10 9  CREATININE 0.83 1.00  CALCIUM 9.2  --    Liver Function Tests:   Recent Labs Lab 09/15/13 1833  AST 13  ALT 12  ALKPHOS 64  BILITOT 0.3  PROT 7.0  ALBUMIN 3.6   CBC:   Recent Labs Lab 09/15/13 1833 09/15/13 1845  WBC 10.2  --   NEUTROABS 4.8  --   HGB 15.9 17.0  HCT 45.3 50.0  MCV 90.2  --   PLT 175  --    Coagulation:   Recent Labs Lab 09/15/13 1833  LABPROT 13.3  INR 1.03   Cardiac Enzymes:   Recent Labs Lab 09/15/13 1833  TROPONINI <  0.30   Urinalysis:   Recent Labs Lab 09/16/13 0642  COLORURINE YELLOW  LABSPEC 1.021  PHURINE 7.5  GLUCOSEU NEGATIVE  HGBUR NEGATIVE  BILIRUBINUR LARGE*  KETONESUR NEGATIVE  PROTEINUR NEGATIVE  UROBILINOGEN 1.0  NITRITE NEGATIVE  LEUKOCYTESUR NEGATIVE   Lipid Panel    Component Value Date/Time   CHOL 216* 09/16/2013 0415   TRIG 288* 09/16/2013 0415   HDL 35* 09/16/2013 0415   CHOLHDL 6.2 09/16/2013 0415   VLDL 58* 09/16/2013 0415   LDLCALC 123* 09/16/2013 0415   HgbA1C  Lab Results  Component Value Date   HGBA1C 7.4* 09/16/2013    Urine Drug Screen:     Component Value Date/Time   LABOPIA NONE DETECTED 09/16/2013 0642   COCAINSCRNUR NONE DETECTED 09/16/2013 0642   LABBENZ NONE DETECTED 09/16/2013 0642   AMPHETMU NONE DETECTED 09/16/2013 0642   THCU NONE DETECTED 09/16/2013 0642   LABBARB NONE DETECTED 09/16/2013 0642    Alcohol Level:   Recent Labs Lab 09/15/13 1833  ETH <11    Ct Head Wo Contrast 09/15/2013    No focal acute intracranial abnormality identified. Chronic diffuse atrophy. Chronic bilateral periventricular  white matter small vessel ischemic change.  MRI HEAD:  09/17/2013    1. Acute ischemic infarct involving the posterior limb of the left internal capsule. 2. Additional subcentimeter ischemic infarct within the deep white matter of the left centrum semi ovale. 3. Remote subcentimeter lacunar infarcts involving the bilateral basal ganglia bilaterally. 4. Moderate cerebral atrophy and chronic microvascular ischemic changes.    MRA HEAD:  09/17/2013    1. Mild multi focal irregularity within the cavernous ICAs bilaterally, left greater than right, likely related to underlying atherosclerotic disease. No high-grade flow-limiting stenosis or proximal branch arterial occlusion identified. 2. No intracranial aneurysm.     Dg Chest Port 1 View 09/15/2013    Negative except for mild cardiac enlargement      2D Echocardiogram  ejection fraction 60-65%. No cardiac source of emboli was identified.  Carotid Doppler Preliminary report: 1-39% ICA stenosis. Vertebral artery flow is antegrade.  EKG sinus bradycardia rate 56 beats per minute. For complete results please see formal report.   Therapy Recommendations - inpatient rehabilitation   Physical Exam   Neurologic Examination:  Mental Status:  Alert, oriented, thought content appropriate. Speech nonfluent. At times words can not be distinguished. Speech is slurred. Speech is overall better today with less dysarthria. Able to follow 3 step commands without difficulty.  Cranial Nerves:  II: Discs flat bilaterally; Visual fields grossly normal, pupils equal, round, reactive to light and accommodation  III,IV, VI: ptosis not present, extra-ocular motions intact bilaterally  V,VII: mild right facial droop, facial light touch sensation normal bilaterally  VIII: hearing normal bilaterally  IX,X: gag reflex present  XI: bilateral shoulder shrug  XII: midline tongue extension  Motor:  Right : Upper extremity 0/5 R - Arm, R Hand 0/5. Left: Upper extremity 5/5   Lower extremity 5/5 Lower extremity 5/5  Tone and bulk:normal tone throughout; no atrophy noted  Sensory: Pinprick and light touch decreased on the RUE  Deep Tendon Reflexes: 2+ and symmetric with absent AJ's bilaterally  Plantars:  Right: downgoing Left: downgoing  Cerebellar:  normal finger-to-nose and normal heel-to-shin test. Difficult to perform on the right secondary to weakness  Gait: Unable to test safely  CV: pulses palpable throughout    ASSESSMENT Brad Taylor is a 63 y.o. male presenting with right upper extremity weakness and speech difficulties.  TPA was not given secondary to late presentation. MRI confirms posterior limb of the left internal capsule infarct and additional subcentimeter left centrum semi ovale deep white matter infarct  In the setting of old bilateral basal ganglia lacunar infarcts.  Infarcts felt to be thrombotic secondary to small vessel disease. On aspirin 81 mg orally every day prior to admission. Now on aspirin 81 mg orally every day and clopidogrel 75 mg orally every day for secondary stroke prevention. Patient with resultant right upper extremity paresis with speech difficulties. Work up completed. Medically ready for rehab.    Hyperlipidemia - cholesterol 216 LDL 123 - Crestor 20 mg daily prior to admission - now on Lipitor 80 mg daily  Diabetes mellitus - hemoglobin A1c 7.4  Hypertension  Irregular heart rate in the emergency department ( sinus rhythm with PVCs noted on telemetry )  Hospital day # 5  TREATMENT/PLAN  Continue clopidogrel 75 mg orally every day alone for secondary stroke prevention. Will discontinue aspirin. No indication for dual antiplatelets.  Inpatient rehabilitation at time of discharge.  No indication for loop recorder  No further stroke workup indicated.  Patient has a 10-15% risk of having another stroke over the next year, the highest risk is within 2 weeks of the most recent stroke/TIA (risk of having a  stroke following a stroke or TIA is the same).  Ongoing risk factor control by Primary Care Physician  Stroke Service will sign off. Please call should any needs arise.  Follow up with Dr. Leonie Man, [facility], in 2 months.  Burnetta Sabin, MSN, RN, ANVP-BC, ANP-BC, [facility] Pager: [phone] 09/20/2013 6:50 PM  I have personally obtained a history, examined the patient, evaluated imaging results, and formulated the assessment and plan of care. I agree with the above. Antony Contras, MD

## 2013-09-20 NOTE — Consult Note (Signed)
WOC wound consult note Reason for Consult:Left lateral LE ulcer. Evidence of previous wound healing on right LE. Wound type:venous insufficiency Pressure Ulcer POA: No Measurement:0.4 round x 0.2cm Wound MHW:KGSU, moist Drainage (amount, consistency, odor) Scant on old dressing. Periwound:Intact on right and on left in areas other than this lateral location with hemosiderin staining and n oedema bilaterally. Dressing procedure/placement/frequency:I have asked Ortho Tech to work in concert with Nursing to have an [place] placed on the left LE today.  (Nursing performs topical wound care; ortho tech applies Unna's Boot.) I have recommended once-weekly changes as this ulcer while full thickness is small and control of the edema should facilitative tissue regrowth. There is no ulceration at the RLE at this time.  Should once develop or if there is concern, please reconsult and we can arrange for ortho tech to apply bilaterally.  Typically it is advisable to dress one at a time when implementing new therapy, as the increased in available circulating fluid may cause respiratory distress. [facility] nursing team will not follow, but will remain available to this patient, the nursing and medical team.  Please re-consult if needed. Thanks, Maudie Flakes, MSN, RN, [place], [place], [place] [phone])

## 2013-09-20 NOTE — PMR Pre-admission (Signed)
PMR Admission Coordinator Pre-Admission Assessment  Patient: Brad Taylor is an 63 y.o., male MRN: [id number] DOB: 05/25/1951 Height: 6' (182.9 cm) Weight: 122.9 kg (270 lb 15.1 oz)              Insurance Information HMO: Yes    PPO:       PCP:       IPA:       80/20:       OTHER: Group # [id number] PRIMARY: [employer]      Policy#: [id number]      Subscriber: Renee Pain CM Name: Rema Fendt      Phone#: [phone]     Fax#: [phone] Pre-Cert#: [id number] (onsite reviewer will do updates)     Employer: Not employed Benefits:  Phone #: [phone]     Name: Brendolyn Patty. Date: 08/05/13     Deduct: $0      Out of Pocket Max: $4900($0 met)      Life Max: unlimited CIR: $245 days 1-7      SNF: $0 days 1-20, $156 days 21-100 Outpatient: medical necessity     Co-Pay: $10/visit Home Health: 100%      Co-Pay: none DME: 80%     Co-Pay: 20% Providers: in network   Emergency [place]   Name Relation Home Work Mobile   [place]  [phone]       Current Medical History  Patient Admitting Diagnosis:  L IC CVA  History of Present Illness: A 63 y.o. male with history of COPD, diabetes mellitus and peripheral neuropathy, PTSD. Admitted 09/15/2013 with right-sided weakness and slurred speech. MRI of the brain showed acute ischemic infarct involving the posterior limb of the left internal capsule as well as additional subcentimeter ischemic infarct deep white matter of the left centrum semiovale and remote lacunar infarct bilateral basal ganglia. MRA of the head with no stenosis or aneurysm. Echocardiogram is pending. Carotid Dopplers with no ICA stenosis. Patient did not receive TPA. Neurology services consulted placed on aspirin/Plavix for CVA prophylaxis as well as subcutaneous Lovenox for DVT prophylaxis. Maintained on a regular consistency diet. Followup wound care nurse for left lateral lower extremity ulcer with dressing changes as advised with  Unna boot.  Physical and occupational therapy evaluations completed 09/16/2013 with recommendations of physical medicine rehabilitation consult to consider inpatient rehabilitation services. Patient was felt to be a good candidate for inpatient rehabilitation services and will be admitted for comprehensive rehabilitation program.     Total: 4=NIH  Past Medical History  Past Medical History  Diagnosis Date  . COPD (chronic obstructive pulmonary disease)   . Diabetes mellitus without complication   . Hypothyroidism   . GERD (gastroesophageal reflux disease)   . Ulcer   . Post traumatic stress disorder   . Depression   . Hyperlipidemia     Family History  family history is not on file.  Prior Rehab/Hospitalizations: None   Current Medications  Current facility-administered medications:aspirin EC tablet 81 mg, 81 mg, Oral, Daily, Nobie Putnam, DO, 81 mg at 09/20/13 1000;  atorvastatin (LIPITOR) tablet 80 mg, 80 mg, Oral, q1800, Nobie Putnam, DO, 80 mg at 09/20/13 1000;  buPROPion (WELLBUTRIN SR) 12 hr tablet 150 mg, 150 mg, Oral, Daily, Jayce G Cook, DO, 150 mg at 09/20/13 1000;  busPIRone (BUSPAR) tablet 5 mg, 5 mg, Oral, BID PRN, Coral Spikes, DO clopidogrel (PLAVIX) tablet 75 mg, 75 mg, Oral, Q breakfast, Nobie Putnam, DO, 75 mg at 09/20/13 0809;  divalproex (DEPAKOTE) DR tablet 1,000 mg, 1,000 mg, Oral, QHS, Jayce G Cook, DO, 1,000 mg at 09/19/13 2307;  enoxaparin (LOVENOX) injection 40 mg, 40 mg, Subcutaneous, Q24H, Jayce G Cook, DO, 40 mg at 09/19/13 2307;  insulin aspart (novoLOG) injection 0-15 Units, 0-15 Units, Subcutaneous, TID WC, Coral Spikes, DO, 3 Units at 09/20/13 1219 insulin glargine (LANTUS) injection 20 Units, 20 Units, Subcutaneous, QHS, Nobie Putnam, DO;  levothyroxine (SYNTHROID, LEVOTHROID) tablet 125 mcg, 125 mcg, Oral, QAC breakfast, Coral Spikes, DO, 125 mcg at 09/20/13 1000;  lisinopril (PRINIVIL,ZESTRIL) tablet 10 mg, 10 mg, Oral,  Daily, Nobie Putnam, DO, 10 mg at 09/20/13 1000;  pantoprazole (PROTONIX) EC tablet 40 mg, 40 mg, Oral, Daily, Jayce G Cook, DO, 40 mg at 09/20/13 0809 sodium chloride 0.9 % injection 3 mL, 3 mL, Intravenous, Q12H, Jayce G Cook, DO, 3 mL at 09/17/13 2121;  sodium chloride 0.9 % injection 3 mL, 3 mL, Intravenous, PRN, Coral Spikes, DO;  traZODone (DESYREL) tablet 300 mg, 300 mg, Oral, QHS, Jayce G Cook, DO, 300 mg at 09/19/13 2307;  venlafaxine XR (EFFEXOR-XR) 24 hr capsule 150 mg, 150 mg, Oral, BID, Jayce G Cook, DO, 150 mg at 09/20/13 1000  Patients Current Diet: Carb Control  Precautions / Restrictions Precautions Precautions: Fall Precaution Comments: unsteady on his feet Restrictions Weight Bearing Restrictions: No   Prior Activity Level Community (5-7x/wk): Went out daily.  Home Assistive Devices / Equipment Home Assistive Devices/Equipment: Cane (specify quad or straight) Home Equipment: Cane - single point  Prior Functional Level Prior Function Level of Independence: Independent;Independent with assistive device(s) Comments: Pt uses cane for distance walking PTA.    Current Functional Level Cognition  Arousal/Alertness: Awake/alert Overall Cognitive Status: Impaired/Different from baseline Difficult to assess due to: Impaired communication Orientation Level: Oriented X4 Safety/Judgement: Decreased awareness of safety;Decreased awareness of deficits General Comments: Pt knows he is unsteady on his feet, but cannot anticipate how to adjust what he is doing to be safer becaus of it (i.e. walking slower).      Extremity Assessment (includes Sensation/Coordination)          ADLs  Grooming: Performed;Wash/dry face;Supervision/safety (using Left hand) Where Assessed - Grooming: Unsupported sitting Upper Body Bathing: Simulated;Minimal assistance Where Assessed - Upper Body Bathing: Unsupported sitting Lower Body Bathing: Simulated;Moderate assistance Where  Assessed - Lower Body Bathing: Unsupported sitting Upper Body Dressing: Performed;Moderate assistance Where Assessed - Upper Body Dressing: Unsupported sitting Lower Body Dressing: Performed;Maximal assistance (donned socks) Where Assessed - Lower Body Dressing: Unsupported sitting ADL Comments: Pt sat EOB >10 min. at supervision level.  Educated pt and wife on repeatedly attempting to use RUE when reaching for objects such as to touch wife's hand.      Mobility  Overal bed mobility: Needs Assistance Bed Mobility: Supine to Sit Supine to sit: Mod assist General bed mobility comments: Heavy reliance on bed rail. Assist with draw pad to pivot hips around toward EOB.    Transfers  Overall transfer level: Needs assistance Equipment used: Straight cane Transfers: Sit to/from Stand Sit to Stand: Mod assist;+2 physical assistance General transfer comment: two person mod assist to prevent posterior and right LOB when initially going to standing.     Ambulation / Gait / Stairs / Wheelchair Mobility  Ambulation/Gait Ambulation Distance (Feet): 200 Feet Gait velocity: decreased General Gait Details: Pt with continued staggering gait pattern.  Son came with [place] for safety due to significant balance deficits during gait.  Pt is not safe  to use cane without two people assisting him as he loses his balance and has a tough time recovering from that LOB.  We will need to try RW next session, but he may need assist to keep his right hand on the RW due to decreased strength and coordination on that side.     Posture / Balance      Special needs/care consideration BiPAP/CPAP No CPM No Continuous Drip IV No Dialysis No         Life Vest No Oxygen No Special Bed No Trach Size No Wound Vac (area) No       Skin Has a small ucer on left leg/ankle area which was treated at the [place]                              Bowel mgmt: Had a BM 09/20/13 Bladder mgmt: Has a condom catheter Diabetic mgmt Yes, on both  oral and insulin medications at home.    Previous Home Environment Living Arrangements: Spouse/significant other  Lives With: Spouse Available Help at Discharge: Family;Available 24 hours/day Type of Home: House Home Layout: One level Home Access: Stairs to enter Entrance Stairs-Rails: Right Entrance Stairs-Number of Steps: 3 Home Care Services: No  Discharge Living Setting Plans for Discharge Living Setting: Patient's home;House;Lives with (comment) (Lives with wife.) Type of Home at Discharge: House Discharge Home Layout: One level Discharge Home Access: Ramped entrance (Ramp at back entrance.) Does the patient have any problems obtaining your medications?: No  Social/Family/Support Systems Patient Roles: Spouse;Parent Contact Information: Selah Zelman - wife Anticipated Caregiver: wife Anticipated Caregiver's Contact Information: Dianne - (h) [phone] Ability/Limitations of Caregiver: Wife not working and can Veterinary surgeon Availability: 24/7 Discharge Plan Discussed with Primary Caregiver: Yes Is Caregiver In Agreement with Plan?: Yes Does Caregiver/Family have Issues with Lodging/Transportation while Pt is in Rehab?: No Son lives across the drive way and works days.  Goals/Additional Needs Patient/Family Goal for Rehab: PT/OT/ST mod I goals Expected length of stay: 7-9 days Cultural Considerations: None Dietary Needs: Carb mod med cal thin liquids Equipment Needs: TBD Pt/Family Agrees to Admission and willing to participate: Yes Program Orientation Provided & Reviewed with Pt/Caregiver Including Roles  & Responsibilities: Yes  Decrease burden of Care through IP rehab admission: N/A  Possible need for SNF placement upon discharge: Not planned  Patient Condition: This patient's medical and functional status has changed since the consult dated: 09/17/13 in which the Rehabilitation Physician determined and documented that the patient's condition is appropriate  for intensive rehabilitative care in an inpatient rehabilitation facility. See "History of Present Illness" (above) for medical update. Functional changes are: Ambulated 200 ft +2 assist with [place]. Patient's medical and functional status update has been discussed with the Rehabilitation physician and patient remains appropriate for inpatient rehabilitation. Will admit to inpatient rehab today.  Preadmission Screen Completed By:  Retta Diones, 09/20/2013 2:29 PM ______________________________________________________________________   Discussed status with Dr. Naaman Plummer on 09/20/13 at 1443 and received telephone approval for admission today.  Admission Coordinator:  Retta Diones, time1443/Date02/16/15

## 2013-09-20 NOTE — Progress Notes (Signed)
Wife refused unna boot for pt. Has home medications in room for wound. Would like to have MD look at materials for wound dressing change.

## 2013-09-20 NOTE — Progress Notes (Signed)
Orthopedic Tech Progress Note Patient Details:  Brad Taylor [birth year] [id number]  Patient ID: Brad Taylor, male   DOB: 05-09-1951, 63 y.o.   MRN: [id number]   Brad Taylor 09/20/2013, 10:46 AMPatient refused unna wrap.

## 2013-09-21 ENCOUNTER — Inpatient Hospital Stay (HOSPITAL_COMMUNITY): Payer: Medicare HMO | Admitting: Speech Pathology

## 2013-09-21 ENCOUNTER — Inpatient Hospital Stay (HOSPITAL_COMMUNITY): Payer: Medicare HMO | Admitting: Rehabilitation

## 2013-09-21 ENCOUNTER — Inpatient Hospital Stay (HOSPITAL_COMMUNITY): Payer: Medicare HMO | Admitting: Occupational Therapy

## 2013-09-21 DIAGNOSIS — E1149 Type 2 diabetes mellitus with other diabetic neurological complication: Secondary | ICD-10-CM

## 2013-09-21 DIAGNOSIS — G8110 Spastic hemiplegia affecting unspecified side: Secondary | ICD-10-CM

## 2013-09-21 DIAGNOSIS — J449 Chronic obstructive pulmonary disease, unspecified: Secondary | ICD-10-CM

## 2013-09-21 DIAGNOSIS — I6330 Cerebral infarction due to thrombosis of unspecified cerebral artery: Secondary | ICD-10-CM

## 2013-09-21 DIAGNOSIS — E1142 Type 2 diabetes mellitus with diabetic polyneuropathy: Secondary | ICD-10-CM

## 2013-09-21 LAB — COMPREHENSIVE METABOLIC PANEL
ALBUMIN: 3.7 g/dL (ref 3.5–5.2)
ALT: 28 U/L (ref 0–53)
AST: 27 U/L (ref 0–37)
Alkaline Phosphatase: 75 U/L (ref 39–117)
BUN: 19 mg/dL (ref 6–23)
CALCIUM: 9.5 mg/dL (ref 8.4–10.5)
CHLORIDE: 92 meq/L — AB (ref 96–112)
CO2: 28 mEq/L (ref 19–32)
CREATININE: 0.8 mg/dL (ref 0.50–1.35)
GFR calc Af Amer: >90 mL/min (ref >90)
GFR calc non Af Amer: >90 mL/min (ref >90)
Glucose, Bld: 187 mg/dL — ABNORMAL HIGH (ref 70–99)
Potassium: 3.9 mEq/L (ref 3.7–5.3)
Sodium: 135 mEq/L — ABNORMAL LOW (ref 137–147)
Total Bilirubin: 0.5 mg/dL (ref 0.3–1.2)
Total Protein: 7 g/dL (ref 6.0–8.3)

## 2013-09-21 LAB — GLUCOSE, CAPILLARY
GLUCOSE-CAPILLARY: 181 mg/dL — AB (ref 70–99)
Glucose-Capillary: 177 mg/dL — ABNORMAL HIGH (ref 70–99)
Glucose-Capillary: 237 mg/dL — ABNORMAL HIGH (ref 70–99)
Glucose-Capillary: 259 mg/dL — ABNORMAL HIGH (ref 70–99)
Glucose-Capillary: 215 mg/dL — ABNORMAL HIGH (ref 70–99)

## 2013-09-21 LAB — CBC WITH DIFFERENTIAL/PLATELET
BASOS ABS: 0.1 10*3/uL (ref 0.0–0.1)
BASOS PCT: 1 % (ref 0–1)
EOS PCT: 7 % — AB (ref 0–5)
Eosinophils Absolute: 0.7 10*3/uL (ref 0.0–0.7)
HCT: 46.1 % (ref 39.0–52.0)
Hemoglobin: 16.2 g/dL (ref 13.0–17.0)
Lymphocytes Relative: 36 % (ref 12–46)
Lymphs Abs: 3.6 10*3/uL (ref 0.7–4.0)
MCH: 31.8 pg (ref 26.0–34.0)
MCHC: 35.1 g/dL (ref 30.0–36.0)
MCV: 90.6 fL (ref 78.0–100.0)
Monocytes Absolute: 0.9 10*3/uL (ref 0.1–1.0)
Monocytes Relative: 9 % (ref 3–12)
NEUTROS ABS: 4.9 10*3/uL (ref 1.7–7.7)
Neutrophils Relative %: 48 % (ref 43–77)
Platelets: 191 10*3/uL (ref 150–400)
RBC: 5.09 MIL/uL (ref 4.22–5.81)
RDW: 12.8 % (ref 11.5–15.5)
WBC: 10.1 10*3/uL (ref 4.0–10.5)

## 2013-09-21 NOTE — Progress Notes (Signed)
Occupational Therapy Session Note  Patient Details  Name: Brad Taylor MRN: [id number] Date of Birth: 09/10/1950  Today's Date: 09/21/2013 Time: 1330-1400 Time Calculation (min): 30 min  Skilled Therapeutic Interventions/Progress Updates:   Pt and wife were educated on AAROM exercises for the RUE.  Handout also provided and wife able to return demonstrate safe assist with exercises while pt was sitting in wheelchair.  Emphasis on slow controlled movements and maintaining visual attention on the UE as he tries to move it.  Finished session by having pt ambulate to the nurses station and back to evaluate and work on dynamic standing balance.  Pt needing overall mod assist hand held.  Therapist supporting pts RUE as well.    Therapy Documentation Precautions:  Precautions Precautions: Fall Precaution Comments: R sided weakness (RUE>RLE), dysarthria Restrictions Weight Bearing Restrictions: No  Pain: Pain Assessment Pain Assessment: No/denies pain ADL: See FIM for current functional status  Therapy/Group: Individual Therapy  Yonah Tangeman OTR/L 09/21/2013, 3:32 PM

## 2013-09-21 NOTE — Progress Notes (Signed)
Inpatient Diabetes Program Recommendations  AACE/ADA: New Consensus Statement on Inpatient Glycemic Control (2013)  Target Ranges:  Prepandial:   less than 140 mg/dL      Peak postprandial:   less than 180 mg/dL (1-2 hours)      Critically ill patients:  140 - 180 mg/dL   H & P states pt takes lantus 60-65 units bid at home. Pt on 20 units lantus at HS here. Fasting glucose typically in 200 range although pt is not eating all that well.  Inpatient Diabetes Program Recommendations Insulin - Basal: Please increase lantus by 10 units to 30 units and continue to titrate until fasting glucose is less than 150 mg/dL  Thank you, Rosita Kea, RN, CNS, Diabetes Coordinator [phone])

## 2013-09-21 NOTE — Progress Notes (Signed)
Subjective/Complaints: 63 y.o. male with history of COPD, diabetes mellitus and peripheral neuropathy, PTSD. Admitted 09/15/2013 with right-sided weakness and slurred speech. MRI of the brain showed acute ischemic infarct involving the posterior limb of the left internal capsule as well as additional subcentimeter ischemic infarct deep white matter of the left centrum semiovale and remote lacunar infarct bilateral basal ganglia. MRA of the head with no stenosis or aneurysm. Echocardiogram is pending. Carotid Dopplers with no ICA stenosis. Patient did not receive TPA. Neurology services consulted placed on aspirin/Plavix for CVA prophylaxis as well as subcutaneous Lovenox for DVT prophylaxis. Maintained on a regular consistency diet  Review of Systems - Negative except R weakness  Objective: Vital Signs: Blood pressure 132/80, pulse 65, temperature 97.6 F (36.4 C), temperature source Oral, resp. rate 18, SpO2 93.00%. Ct Head Wo Contrast  09/19/2013   CLINICAL DATA:  Worsening right upper extremity weakness and slurred speech.  EXAM: CT HEAD WITHOUT CONTRAST  TECHNIQUE: Contiguous axial images were obtained from the base of the skull through the vertex without intravenous contrast.  COMPARISON:  09/15/2013 and MRI brain 09/16/2013  FINDINGS: Ventricles, cisterns and other CSF spaces are within normal. There is chronic ischemic microvascular disease. There is an old lacunar infarct over the head of the left caudate nucleus unchanged. There is evidence of patient's known acute infarct over the posterior limb of the left internal capsule measuring approximately 1.2 x 1.6 cm. There is no mass, mass effect, shift of midline structures or acute hemorrhage. Remainder of the exam is unchanged to include sinus inflammatory disease.  IMPRESSION: Evidence of patient's known ovoid acute infarct over the posterior limb of the left internal capsule measuring approximately 1.2 x 1.6 cm.  Chronic ischemic microvascular  disease. Old small lacunar infarct of the left caudate.   Electronically Signed   By: Marin Olp M.D.   On: 09/19/2013 10:52   Results for orders placed during the hospital encounter of 09/20/13 (from the past 72 hour(s))  GLUCOSE, CAPILLARY     Status: Abnormal   Collection Time    09/20/13  5:08 PM      Result Value Ref Range   Glucose-Capillary 209 (*) 70 - 99 mg/dL   Comment 1 Notify RN    CBC     Status: None   Collection Time    09/20/13  7:40 PM      Result Value Ref Range   WBC 10.3  4.0 - 10.5 K/uL   RBC 5.09  4.22 - 5.81 MIL/uL   Hemoglobin 16.3  13.0 - 17.0 g/dL   HCT 45.6  39.0 - 52.0 %   MCV 89.6  78.0 - 100.0 fL   MCH 32.0  26.0 - 34.0 pg   MCHC 35.7  30.0 - 36.0 g/dL   RDW 12.8  11.5 - 15.5 %   Platelets 180  150 - 400 K/uL  CREATININE, SERUM     Status: None   Collection Time    09/20/13  7:40 PM      Result Value Ref Range   Creatinine, Ser 0.71  0.50 - 1.35 mg/dL   GFR calc non Af Amer >90  >90 mL/min   GFR calc Af Amer >90  >90 mL/min   Comment: (NOTE)     The eGFR has been calculated using the CKD EPI equation.     This calculation has not been validated in all clinical situations.     eGFR's persistently <90 mL/min signify possible Chronic Kidney  Disease.  GLUCOSE, CAPILLARY     Status: Abnormal   Collection Time    09/20/13  9:14 PM      Result Value Ref Range   Glucose-Capillary 177 (*) 70 - 99 mg/dL  CBC WITH DIFFERENTIAL     Status: Abnormal   Collection Time    09/21/13  5:45 AM      Result Value Ref Range   WBC 10.1  4.0 - 10.5 K/uL   RBC 5.09  4.22 - 5.81 MIL/uL   Hemoglobin 16.2  13.0 - 17.0 g/dL   HCT 46.1  39.0 - 52.0 %   MCV 90.6  78.0 - 100.0 fL   MCH 31.8  26.0 - 34.0 pg   MCHC 35.1  30.0 - 36.0 g/dL   RDW 12.8  11.5 - 15.5 %   Platelets 191  150 - 400 K/uL   Neutrophils Relative % 48  43 - 77 %   Neutro Abs 4.9  1.7 - 7.7 K/uL   Lymphocytes Relative 36  12 - 46 %   Lymphs Abs 3.6  0.7 - 4.0 K/uL   Monocytes Relative 9   3 - 12 %   Monocytes Absolute 0.9  0.1 - 1.0 K/uL   Eosinophils Relative 7 (*) 0 - 5 %   Eosinophils Absolute 0.7  0.0 - 0.7 K/uL   Basophils Relative 1  0 - 1 %   Basophils Absolute 0.1  0.0 - 0.1 K/uL  COMPREHENSIVE METABOLIC PANEL     Status: Abnormal   Collection Time    09/21/13  5:45 AM      Result Value Ref Range   Sodium 135 (*) 137 - 147 mEq/L   Potassium 3.9  3.7 - 5.3 mEq/L   Chloride 92 (*) 96 - 112 mEq/L   CO2 28  19 - 32 mEq/L   Glucose, Bld 187 (*) 70 - 99 mg/dL   BUN 19  6 - 23 mg/dL   Creatinine, Ser 0.80  0.50 - 1.35 mg/dL   Calcium 9.5  8.4 - 10.5 mg/dL   Total Protein 7.0  6.0 - 8.3 g/dL   Albumin 3.7  3.5 - 5.2 g/dL   AST 27  0 - 37 U/L   ALT 28  0 - 53 U/L   Alkaline Phosphatase 75  39 - 117 U/L   Total Bilirubin 0.5  0.3 - 1.2 mg/dL   GFR calc non Af Amer >90  >90 mL/min   GFR calc Af Amer >90  >90 mL/min   Comment: (NOTE)     The eGFR has been calculated using the CKD EPI equation.     This calculation has not been validated in all clinical situations.     eGFR's persistently <90 mL/min signify possible Chronic Kidney     Disease.  GLUCOSE, CAPILLARY     Status: Abnormal   Collection Time    09/21/13  7:26 AM      Result Value Ref Range   Glucose-Capillary 181 (*) 70 - 99 mg/dL   Comment 1 Notify RN         General: No acute distress Mood and affect are appropriate Heart: Regular rate and rhythm no rubs murmurs or extra sounds Lungs: Clear to auscultation, breathing unlabored, no rales or wheezes Abdomen: Positive bowel sounds, soft nontender to palpation, nondistended Extremities: No clubbing, cyanosis, or edema Skin: No evidence of breakdown, no evidence of rash Neurologic: Cranial nerves II through XII intact, motor strength is 5/5  in bilateral deltoid, bicep, tricep, grip, hip flexor, knee extensors, ankle dorsiflexor and plantar flexor Sensory exam normal sensation to light touch and proprioception in bilateral upper and lower  extremities Cerebellar exam normal finger to nose to finger as well as heel to shin in bilateral upper and lower extremities Musculoskeletal: Full range of motion in all 4 extremities. No joint swelling  Assessment/Plan: 1. Functional deficits secondary to embolic left internal capsule/deep white matter infarct   which require 3+ hours per day of interdisciplinary therapy in a comprehensive inpatient rehab setting. Physiatrist is providing close team supervision and 24 hour management of active medical problems listed below. Physiatrist and rehab team continue to assess barriers to discharge/monitor patient progress toward functional and medical goals. FIM:                   Comprehension Comprehension Mode: Auditory Comprehension: 5-Follows basic conversation/direction: With no assist  Expression Expression: 4-Expresses basic 75 - 89% of the time/requires cueing 10 - 24% of the time. Needs helper to occlude trach/needs to repeat words.  Social Interaction Social Interaction: 3-Interacts appropriately 50 - 74% of the time - May be physically or verbally inappropriate.        Medical Problem List and Plan:  1. Left internal capsule/deep white matter infarcts felt to be embolic secondary to unknown source  2. DVT Prophylaxis/Anticoagulation: Subcutaneous Lovenox. Monitor platelet counts and any signs of bleeding  3. Pain Management: Tylenol as needed  4. Mood/PTSD/depression. Wellbutrin 150 mg daily, BuSpar 5 mg twice a day as needed, Depakote 1000 mg each bedtime, effexor 150 mg twice a day and trazodone 300 mg each bedtime  5. Neuropsych: This patient is not yet capable of making decisions on his own behalf.  6. Diabetes mellitus with peripheral neuropathy. Hemoglobin A1c 7.4. Currently on sliding scale insulin. Patient on Glucotrol XL 10 mg daily. Check blood sugars a.c. and at bedtime resume oral agents as directed  7. Hypertension. Lisinopril 10 mg day. Monitor with  increased activity  8. Hypothyroidism. Synthroid .TSH 4.045  9. Hyperlipidemia. Lipitor  10. GERD. Protonix  11. Left lateral lower extremity ulcer. Followup wound care nurse with dressing changes as directed/Unna boot  LOS (Days) 1 A FACE TO FACE EVALUATION WAS PERFORMED  KIRSTEINS,ANDREW E 09/21/2013, 8:51 AM

## 2013-09-21 NOTE — Evaluation (Signed)
Speech Language Pathology Assessment and Plan  Patient Details  Name: Brad Taylor MRN: [id number] Date of Birth: September 01, 1950  SLP Diagnosis: Speech and Language deficits;Dysarthria;Cognitive Impairments  Rehab Potential: Good ELOS: 14-16 days   Today's Date: 09/21/2013 Time: 0932-3557 Time Calculation (min): 53 min  Problem List:  Patient Active Problem List   Diagnosis Date Noted  . CVA (cerebral infarction) 09/20/2013  . Stroke 09/15/2013   Past Medical History:  Past Medical History  Diagnosis Date  . COPD (chronic obstructive pulmonary disease)   . Diabetes mellitus without complication   . Hypothyroidism   . GERD (gastroesophageal reflux disease)   . Ulcer   . Post traumatic stress disorder   . Depression   . Hyperlipidemia    Past Surgical History:  Past Surgical History  Procedure Laterality Date  . Cholecystectomy    . Cataract extraction    . Knee surgery      Assessment / Plan / Recommendation Clinical Impression Brad Taylor is a 63 y.o. male with history of COPD, diabetes mellitus and peripheral neuropathy, PTSD. Admitted 09/15/2013 with right-sided weakness and slurred speech. MRI of the brain showed acute ischemic infarct involving the posterior limb of the left internal capsule as well as additional subcentimeter ischemic infarct deep white matter of the left centrum semiovale and remote lacunar infarct bilateral basal ganglia. MRA of the head with no stenosis or aneurysm. Echocardiogram is pending. Carotid Dopplers with no ICA stenosis. Patient did not receive TPA. Neurology services consulted placed on aspirin/Plavix for CVA prophylaxis as well as subcutaneous Lovenox for DVT prophylaxis. Maintained on a regular consistency diet. Patient admitted to [facility] rehab on 2/ 16/15. Cognitive-linguistic evaluation performed on 09/21/13 and found impaired intelligibility due to poor coordination. Apraxia suspected due to groping and poor coordination. Patient presents  with mild expressive aphasia characterized by word finding deficits. Evaluation revealed impairments in complex problem solving, particularly in problems involving calculations. Due to patient's impaired articulation, linguistic and problem solving skills he could benefit from skilled SLP intervention to maximize CIR stay and reduce burden of care prior to discharge home.  Skilled Therapeutic Interventions          SLP initiated treatment with education regarding effective speech intelligibility strategies.  Patient verbalized understanding but skills will be addressed in functional activities.   SLP Assessment  Patient will need skilled [facility] during CIR admission    Recommendations  Oral Care Recommendations: Oral care BID Patient destination: Home Follow up Recommendations: 24 hour supervision/assistance;Outpatient SLP Equipment Recommended: None recommended by SLP    SLP Frequency 5 out of 7 days   SLP Treatment/Interventions Cognitive remediation/compensation;Cueing hierarchy;Functional tasks;Internal/external aids;Patient/family education;Speech/Language facilitation;Therapeutic Activities    Pain Pain Assessment Pain Assessment: No/denies pain Prior Functioning Cognitive/Linguistic Baseline: Within functional limits Type of Home: House  Lives With: Spouse Available Help at Discharge: Family;Available 24 hours/day Vocation: Retired  Industrial/product designer Term Goals: Week 1: SLP Short Term Goal 1 (Week 1): Patient will utilize speech intelligibility strategies at the sentnece level with Min vebral cues to self-monitor and correct errors. SLP Short Term Goal 2 (Week 1): Patient will utilize word finding strategies in conversation with Min verbal cues. SLP Short Term Goal 3 (Week 1): Patient will demonstrate problem solving with daily complex problems with Min verbal cues to self-monitor and correct errors.  See FIM for current functional status Refer to Care Plan for  Long Term Goals  Recommendations for other services: None  Discharge Criteria: Patient will be discharged  from SLP if patient refuses treatment 3 consecutive times without medical reason, if treatment goals not met, if there is a change in medical status, if patient makes no progress towards goals or if patient is discharged from hospital.  The above assessment, treatment plan, treatment alternatives and goals were discussed and mutually agreed upon: by patient and by family  Carmelia Roller., [place]  [place] 09/21/2013, 5:03 PM

## 2013-09-21 NOTE — Evaluation (Signed)
Physical Therapy Assessment and Plan  Patient Details  Name: Brad Taylor MRN: [id number] Date of Birth: Jun 28, 1951  PT Diagnosis: Abnormal posture, Abnormality of gait, Cognitive deficits, Coordination disorder, Difficulty walking, Hemiparesis dominant and Muscle weakness Rehab Potential: Excellent ELOS: 14-16 days   Today's Date: 09/21/2013 Time: 832-930 Time Calculation (min): 58 min  Problem List:  Patient Active Problem List   Diagnosis Date Noted  . CVA (cerebral infarction) 09/20/2013  . Stroke 09/15/2013    Past Medical History:  Past Medical History  Diagnosis Date  . COPD (chronic obstructive pulmonary disease)   . Diabetes mellitus without complication   . Hypothyroidism   . GERD (gastroesophageal reflux disease)   . Ulcer   . Post traumatic stress disorder   . Depression   . Hyperlipidemia    Past Surgical History:  Past Surgical History  Procedure Laterality Date  . Cholecystectomy    . Cataract extraction    . Knee surgery      Assessment & Plan Clinical Impression: Patient is a 63 y.o. year old male with recent admission to the hospital on 09/15/2013 with right-sided weakness and slurred speech. MRI of the brain showed acute ischemic infarct involving the posterior limb of the left internal capsule as well as additional subcentimeter ischemic infarct deep white matter of the left centrum semiovale and remote lacunar infarct bilateral basal ganglia.  Patient transferred to CIR on 09/20/2013 .   Patient currently requires mod with mobility secondary to muscle weakness, impaired timing and sequencing, decreased coordination and decreased motor planning and decreased problem solving, decreased memory and delayed processing.  Prior to hospitalization, patient was modified independent  with mobility and lived with Spouse in a House home.  Home access is stairs at one entrance (3) but also has rampStairs to enter;Ramped entrance.  Patient will benefit from  skilled PT intervention to maximize safe functional mobility, minimize fall risk and decrease caregiver burden for planned discharge home with 24 hour supervision.  Anticipate patient will benefit from follow up OP at discharge.  PT - End of Session Activity Tolerance: Tolerates 30+ min activity without fatigue Endurance Deficit: No PT Assessment Rehab Potential: Excellent PT Patient demonstrates impairments in the following area(s): Balance;Endurance;Motor;Safety PT Transfers Functional Problem(s): Bed Mobility;Bed to Chair;Car;Furniture;Floor PT Locomotion Functional Problem(s): Ambulation;Wheelchair Mobility;Stairs PT Plan PT Intensity: Minimum of 1-2 x/day ,45 to 90 minutes PT Frequency: 5 out of 7 days PT Duration Estimated Length of Stay: 14-16 days PT Treatment/Interventions: Ambulation/gait training;Balance/vestibular training;Community reintegration;Discharge planning;DME/adaptive equipment instruction;Functional mobility training;Neuromuscular re-education;Patient/family education;Stair training;Therapeutic Activities;Therapeutic Exercise;UE/LE Strength taining/ROM;UE/LE Coordination activities;Visual/perceptual remediation/compensation;Wheelchair propulsion/positioning PT Transfers Anticipated Outcome(s): supervision PT Locomotion Anticipated Outcome(s): supervision PT Recommendation Follow Up Recommendations: Outpatient PT;24 hour supervision/assistance Patient destination: Home Equipment Recommended: To be determined  Skilled Therapeutic Intervention Performed all initial evaluation assessments.  See details below.  Also began initiation and education on correct technique for bed mobility, transfers, gait with hemi walker and also stair training.  Discussed with pt and wife ELOS, possible equipment needs, what to expect while on rehab, and safety.  Both verbalize understanding.   PT Evaluation Precautions/Restrictions Precautions Precautions: Fall Precaution Comments: R  sided weakness (RUE>RLE), dysarthria Restrictions Weight Bearing Restrictions: No General Chart Reviewed: Yes Family/Caregiver Present: Yes Vital Signs  Pain Pain Assessment Pain Assessment: No/denies pain Pain Score: 0-No pain Home Living/Prior Functioning Home Living Available Help at Discharge: Family;Available 24 hours/day Type of Home: House Home Access: Stairs to enter;Ramped entrance Entrance Stairs-Number of Steps: stairs at one entrance (3)  but also has ramp Entrance Stairs-Rails: Right Home Layout: One level Additional Comments: has grab bars in shower, has built in shower seat  Lives With: Spouse Prior Function Level of Independence: Needs assistance with ADLs Bath: Minimal Dressing: Moderate (Needed assistance with socks and shoes)  Able to Take Stairs?: Yes Driving: Yes Vocation: Retired Leisure: Hobbies-yes (Comment) Comments: Pt states he used a cane intermittently at home, likes to work in the yard Vision/Perception  Vision - History Baseline Vision: No visual deficits Visual History: Cataracts Patient Visual Report: No change from baseline Vision - Assessment Vision Assessment: Vision tested Ocular Range of Motion: Impaired-to be further tested in functional context Tracking/Visual Pursuits: Decreased smoothness of horizontal tracking;Left eye does not track medially;Other (comment) Additional Comments: Pt with decreased ability to track moving object in all directions but especially to the right of midline.  Able to identify correct number of finger therapist was holding up in all quadrants and peripheral vision grossly WFLs for gross testing.  Will continue to monitor in functional context.  Cognition Overall Cognitive Status: Impaired/Different from baseline Arousal/Alertness: Awake/alert Orientation Level: Oriented X4 Attention: Focused;Sustained;Selective Focused Attention: Appears intact Sustained Attention: Appears intact Selective Attention:  Appears intact Memory: Impaired Memory Impairment: Decreased recall of new information Awareness: Appears intact Problem Solving: Impaired Problem Solving Impairment: Functional basic;Functional complex Executive Function: Sequencing Sequencing: Impaired Sequencing Impairment: Functional basic Safety/Judgment: Appears intact Comments: Pt able to state that he would need to call for assist if needing to get OOB or chair and that he would likely fall if he didn't get assist.  Sensation Sensation Light Touch: Appears Intact Stereognosis: Not tested Hot/Cold: Not tested Proprioception: Appears Intact Coordination Gross Motor Movements are Fluid and Coordinated: No Fine Motor Movements are Fluid and Coordinated: No Coordination and Movement Description: Pt with Brunnstrum stage II movement in the arm and hand.  Trace digit flicker noted as well as elbow extension. Heel Shin Test: decreased fluidity of movement, however did not note any over or under shooting of target Motor  Motor Motor: Hemiplegia Motor - Skilled Clinical Observations: Pt with weakness in RUE>RLE, mild R lateral lean in standing, note posterior pelvic tilt in sitting  Mobility Bed Mobility Bed Mobility: Supine to Sit Supine to Sit: 4: Min assist Supine to Sit Details: Verbal cues for technique;Manual facilitation for weight shifting;Verbal cues for sequencing Supine to Sit Details (indicate cue type and reason): Pt able to bring BLEs out of bed then use handrail in order to elevate trunk into sitting.  Note increased time and effort to complete task, therefore provided cues for rolling to make transition easier.  Transfers Transfers: Yes Sit to Stand: 4: Min assist;With armrests;With upper extremity assist;From chair/3-in-1 Sit to Stand Details: Verbal cues for sequencing;Manual facilitation for weight shifting Stand to Sit: 4: Min assist;With upper extremity assist;With armrests;To chair/3-in-1 Stand to Sit Details  (indicate cue type and reason): Verbal cues for technique;Verbal cues for precautions/safety;Manual facilitation for weight shifting Stand Pivot Transfers: 3: Mod assist;4: Min assist Stand Pivot Transfer Details: Verbal cues for technique;Verbal cues for precautions/safety;Verbal cues for sequencing;Manual facilitation for weight shifting;Verbal cues for safe use of DME/AE Locomotion  Ambulation Ambulation: Yes Ambulation/Gait Assistance: 3: Mod assist Ambulation Distance (Feet): 25 Feet (then another 56' w/ hemi walker) Assistive device: None;Hemi-walker Ambulation/Gait Assistance Details: Verbal cues for technique;Verbal cues for sequencing;Verbal cues for precautions/safety;Verbal cues for gait pattern;Verbal cues for safe use of DME/AE;Manual facilitation for weight shifting Ambulation/Gait Assistance Details: assessed gait without use of AD initially  with pt requiring mod assist with cues for increased step length.  Note some uncordination in RLE when stepping, however feel it may be due to weakness.   Then initiated gait training with use of hemi walker with min/mod assist and manual facilitation for weight shifting to the R for increased WB.  Note pt with increased difficulty with sequencing, however overall did well.  Gait Gait: Yes Gait Pattern: Impaired Gait Pattern: Step-to pattern;Decreased step length - left;Decreased stance time - right;Decreased stride length;Decreased weight shift to right;Decreased hip/knee flexion - right;Trunk flexed Stairs / Additional Locomotion Stairs: Yes Stairs Assistance: 3: Mod assist Stairs Assistance Details: Verbal cues for sequencing;Verbal cues for technique;Verbal cues for precautions/safety;Verbal cues for gait pattern;Verbal cues for safe use of DME/AE;Manual facilitation for weight shifting Stair Management Technique: One rail Left;Step to pattern;Forwards Number of Stairs: 5 Height of Stairs: 4 Wheelchair Mobility Wheelchair Mobility:  Yes Wheelchair Assistance: 4: Advertising account executive Details: Verbal cues for sequencing;Verbal cues for technique;Tactile cues for initiation;Tactile cues for placement Wheelchair Propulsion: Left upper extremity;Left lower extremity Wheelchair Parts Management: Needs assistance Distance: 80  Trunk/Postural Assessment  Cervical Assessment Cervical Assessment: Exceptions to [facility] Cervical Strength Overall Cervical Strength Comments: head very slightly turned/tilted to the L Thoracic Assessment Thoracic Assessment: Within Functional Limits Lumbar Assessment Lumbar Assessment: Within Functional Limits Postural Control Postural Control: Deficits on evaluation Postural Limitations: Pt demonstrates rounded shoulders and posterior pelvic tilt in sitting  Balance Balance Balance Assessed: Yes Dynamic Sitting Balance Dynamic Sitting - Balance Support: No upper extremity supported Dynamic Sitting - Level of Assistance: 5: Stand by assistance;4: Min assist Static Standing Balance Static Standing - Balance Support: No upper extremity supported Static Standing - Level of Assistance: 4: Min assist Extremity Assessment  RUE Assessment RUE Assessment: Exceptions to [facility] RUE Strength RUE Overall Strength Comments: Pt with Brunnstrum stage II movement in the arm and hand.  Slight digit flexion and elbow extension present with isolated testing.  PROM WFLs for all joints however. LUE Assessment LUE Assessment: Within Functional Limits RLE Assessment RLE Assessment: Exceptions to [facility] RLE Strength RLE Overall Strength: Deficits RLE Overall Strength Comments: hip flex 3/5, hip ext 3 to 3+/5, knee ext 3+/5, knee flex 3+/5, ankle DF 3/5, PF 3+/5 LLE Assessment LLE Assessment: Within Functional Limits  FIM:  FIM - Locomotion: Wheelchair Distance: 80 FIM - Locomotion: Ambulation Ambulation/Gait Assistance: 3: Mod assist   Refer to Care Plan for Long Term Goals  Recommendations for  other services: None  Discharge Criteria: Patient will be discharged from PT if patient refuses treatment 3 consecutive times without medical reason, if treatment goals not met, if there is a change in medical status, if patient makes no progress towards goals or if patient is discharged from hospital.  The above assessment, treatment plan, treatment alternatives and goals were discussed and mutually agreed upon: by patient and by family  Denice Bors 09/21/2013, 12:06 PM

## 2013-09-21 NOTE — Progress Notes (Signed)
Patient information reviewed and entered into eRehab system by Chrysta Fulcher, RN, CRRN, PPS Coordinator.  Information including medical coding and functional independence measure will be reviewed and updated through discharge.     Per nursing patient was given "Data Collection Information Summary for Patients in Inpatient Rehabilitation Facilities with attached "Privacy Act Statement-Health Care Records" upon admission.  

## 2013-09-21 NOTE — Evaluation (Signed)
Occupational Therapy Assessment and Plan  Patient Details  Name: Brad Taylor MRN: [id number] Date of Birth: July 17, 1951  OT Diagnosis: abnormal posture, cognitive deficits, disturbance of vision, hemiplegia affecting dominant side and muscle weakness (generalized) Rehab Potential: Rehab Potential: Good ELOS: 14-16 days   Today's Date: 09/21/2013 Time: 1030-1130 Time Calculation (min): 60 min  Problem List:  Patient Active Problem List   Diagnosis Date Noted  . CVA (cerebral infarction) 09/20/2013  . Stroke 09/15/2013    Past Medical History:  Past Medical History  Diagnosis Date  . COPD (chronic obstructive pulmonary disease)   . Diabetes mellitus without complication   . Hypothyroidism   . GERD (gastroesophageal reflux disease)   . Ulcer   . Post traumatic stress disorder   . Depression   . Hyperlipidemia    Past Surgical History:  Past Surgical History  Procedure Laterality Date  . Cholecystectomy    . Cataract extraction    . Knee surgery      Assessment & Plan Clinical Impression: Patient is a 63 y.o. year old male with recent admission to the hospital on 09/15/2013 with right-sided weakness and slurred speech. MRI of the brain showed acute ischemic infarct involving the posterior limb of the left internal capsule as well as additional subcentimeter ischemic infarct deep white matter of the left centrum semiovale and remote lacunar infarct bilateral basal ganglia.  Patient transferred to CIR on 09/20/2013 .    Patient currently requires max with basic self-care skills secondary to muscle weakness, impaired timing and sequencing and decreased coordination, decreased visual acuity and decreased visual motor skills and decreased problem solving and decreased memory.  Prior to hospitalization, patient could complete ADLs with independent .  Patient will benefit from skilled intervention to decrease level of assist with basic self-care skills and increase independence  with basic self-care skills prior to discharge home with care partner.  Anticipate patient will require 24 hour supervision and follow up outpatient.  OT - End of Session Activity Tolerance: Tolerates 30+ min activity with multiple rests Endurance Deficit: No OT Assessment Rehab Potential: Good OT Patient demonstrates impairments in the following area(s): Balance;Motor;Safety;Vision OT Basic ADL's Functional Problem(s): Eating;Grooming;Bathing;Dressing;Toileting OT Transfers Functional Problem(s): Toilet;Tub/Shower OT Additional Impairment(s): Fuctional Use of Upper Extremity OT Plan OT Intensity: Minimum of 1-2 x/day, 45 to 90 minutes OT Frequency: 5 out of 7 days OT Duration/Estimated Length of Stay: 14-16 days OT Treatment/Interventions: Balance/vestibular training;Community reintegration;Cognitive remediation/compensation;Discharge planning;Functional mobility training;Functional electrical stimulation;DME/adaptive equipment instruction;Neuromuscular re-education;Patient/family education;Therapeutic Activities;Splinting/orthotics;Self Care/advanced ADL retraining;Therapeutic Exercise;UE/LE Strength taining/ROM;UE/LE Coordination activities;Visual/perceptual remediation/compensation OT Self Feeding Anticipated Outcome(s): modified independent OT Basic Self-Care Anticipated Outcome(s): min assist level OT Toileting Anticipated Outcome(s): supervision OT Bathroom Transfers Anticipated Outcome(s): supervision OT Recommendation Patient destination: Home Follow Up Recommendations: Outpatient OT Equipment Recommended: None recommended by OT   Skilled Therapeutic Intervention Pt began working on selfcare re-training sit to stand at the sink including RUE use and positioning, sit to stand and standing balance.  Explained to pt and wife ELOS and overall projected goals for OT services.    OT Evaluation Precautions/Restrictions  Precautions Precautions: Fall Precaution Comments: R sided  weakness (RUE>RLE), dysarthria Restrictions Weight Bearing Restrictions: No  Pain Pain Assessment Pain Assessment: No/denies pain Pain Score: 0-No pain Home Living/Prior Functioning Home Living Available Help at Discharge: Family;Available 24 hours/day Type of Home: House Home Access: Stairs to enter;Ramped entrance Entrance Stairs-Number of Steps: stairs at one entrance (3) but also has ramp Entrance Stairs-Rails: Right Home Layout: One level Additional  Comments: has grab bars in shower, has built in shower seat  Lives With: Spouse IADL History Current License: Yes Prior Function Level of Independence: Needs assistance with ADLs Bath: Minimal Dressing: Moderate (Needed assistance with socks and shoes)  Able to Take Stairs?: Yes Driving: Yes Vocation: Retired Leisure: Hobbies-yes (Comment) Comments: Pt states he used a cane intermittently at home, likes to work in the yard ADL  See FIM scale  Vision/Perception  Vision - History Baseline Vision: No visual deficits Visual History: Cataracts Patient Visual Report: No change from baseline Vision - Assessment Vision Assessment: Vision tested Ocular Range of Motion: Impaired-to be further tested in functional context Tracking/Visual Pursuits: Decreased smoothness of horizontal tracking;Left eye does not track medially;Other (comment) Additional Comments: Pt with decreased ability to track moving object in all directions but especially to the right of midline.  Able to identify correct number of finger therapist was holding up in all quadrants and peripheral vision grossly WFLs for gross testing.  Will continue to monitor in functional context.  Cognition Overall Cognitive Status: Impaired/Different from baseline Arousal/Alertness: Awake/alert Orientation Level: Oriented X4 Attention: Focused;Sustained;Selective Focused Attention: Appears intact Sustained Attention: Appears intact Selective Attention: Appears intact Memory:  Impaired Memory Impairment: Decreased recall of new information Awareness: Appears intact Problem Solving: Impaired Problem Solving Impairment: Functional basic;Functional complex Executive Function: Sequencing Sequencing: Impaired Sequencing Impairment: Functional basic Safety/Judgment: Appears intact Comments: Pt able to state that he would need to call for assist if needing to get OOB or chair and that he would likely fall if he didn't get assist.  Sensation Sensation Light Touch: Appears Intact Stereognosis: Not tested Hot/Cold: Not tested Proprioception: Appears Intact Coordination Gross Motor Movements are Fluid and Coordinated: No Fine Motor Movements are Fluid and Coordinated: No Coordination and Movement Description: Pt with Brunnstrum stage II movement in the arm and hand.  Trace digit flicker noted as well as elbow extension. Heel Shin Test: decreased fluidity of movement, however did not note any over or under shooting of target Motor  Motor Motor: Hemiplegia Motor - Skilled Clinical Observations: Pt with weakness in RUE>RLE, mild R lateral lean in standing, note posterior pelvic tilt in sitting Mobility  Bed Mobility Bed Mobility: Supine to Sit Supine to Sit: 4: Min assist Supine to Sit Details: Verbal cues for technique;Manual facilitation for weight shifting;Verbal cues for sequencing Supine to Sit Details (indicate cue type and reason): Pt able to bring BLEs out of bed then use handrail in order to elevate trunk into sitting.  Note increased time and effort to complete task, therefore provided cues for rolling to make transition easier.  Transfers Transfers: Sit to Stand Sit to Stand: 4: Min assist;With armrests;With upper extremity assist;From chair/3-in-1 Sit to Stand Details: Verbal cues for sequencing;Manual facilitation for weight shifting Stand to Sit: 4: Min assist;With upper extremity assist;With armrests;To chair/3-in-1 Stand to Sit Details (indicate cue  type and reason): Verbal cues for technique;Verbal cues for precautions/safety;Manual facilitation for weight shifting  Trunk/Postural Assessment  Cervical Assessment Cervical Assessment: Exceptions to [facility] Cervical Strength Overall Cervical Strength Comments: head very slightly turned/tilted to the L Thoracic Assessment Thoracic Assessment: Within Functional Limits Lumbar Assessment Lumbar Assessment: Within Functional Limits Postural Control Postural Control: Deficits on evaluation Postural Limitations: Pt demonstrates rounded shoulders and posterior pelvic tilt in sitting  Balance Balance Balance Assessed: Yes Dynamic Sitting Balance Dynamic Sitting - Balance Support: No upper extremity supported Dynamic Sitting - Level of Assistance: 5: Stand by assistance;4: Min assist Static Standing Balance Static Standing -  Balance Support: No upper extremity supported Static Standing - Level of Assistance: 4: Min assist Extremity/Trunk Assessment RUE Assessment RUE Assessment: Exceptions to [facility] RUE Strength RUE Overall Strength Comments: Pt with Brunnstrum stage II movement in the arm and hand.  Slight digit flexion and elbow extension present with isolated testing.  PROM WFLs for all joints however. LUE Assessment LUE Assessment: Within Functional Limits  FIM:  FIM - Eating Eating Activity: 5: Supervision/cues FIM - Grooming Grooming Steps: Wash, rinse, dry face Grooming: 3: Patient completes 2 of 4 or 3 of 5 steps FIM - Bathing Bathing Steps Patient Completed: Chest;Right Arm;Abdomen;Front perineal area;Right upper leg;Left upper leg Bathing: 3: Mod-Patient completes 5-7 48f10 parts or 50-74% FIM - Upper Body Dressing/Undressing Upper body dressing/undressing steps patient completed: Put head through opening of pull over shirt/dress Upper body dressing/undressing: 2: Max-Patient completed 25-49% of tasks FIM - Lower Body Dressing/Undressing Lower body dressing/undressing: 1:  Total-Patient completed less than 25% of tasks   Refer to Care Plan for Long Term Goals  Recommendations for other services: None  Discharge Criteria: Patient will be discharged from OT if patient refuses treatment 3 consecutive times without medical reason, if treatment goals not met, if there is a change in medical status, if patient makes no progress towards goals or if patient is discharged from hospital.  The above assessment, treatment plan, treatment alternatives and goals were discussed and mutually agreed upon: by patient and by family  Emaad Nanna OTR/L 09/21/2013, 12:02 PM

## 2013-09-21 NOTE — Consult Note (Addendum)
WOC wound follow up Wound type:LLE (lateral aspect) wound care follow up.  Seen yesterday while an inpatient and MD is requesting orders for care now that he is in Rehab Measurement:0.4cm round and 0.2cm deep (seen yesterday) Wound QQI:WLNL, moist (seen yesterday) Drainage (amount, consistency, odor) Patient with Unna's Boot Periwound:Not seen today Dressing procedure/placement/frequency:Patient's wife asked to meet with [place] today.  Long history of Venous Insufficiency ulcerations that are now.  Patient has tired Unna's boots for [facility] at [facility] with little effect.  [facility] impregnated dressing covered with a Vaseline Lorelle [place] and secured with Kerlix.  The patient's wife is changing it every other day.  She would like to continue performing the wound care while he is here and I have agreed to that protocol.  [facility] nursing team will not follow, but will remain available to this patient, the nursing and medical team.  Please re-consult if needed. Thanks, Maudie Flakes, MSN, RN, [place], [place], [place] [phone])

## 2013-09-22 ENCOUNTER — Inpatient Hospital Stay (HOSPITAL_COMMUNITY): Payer: Medicare HMO | Admitting: Occupational Therapy

## 2013-09-22 ENCOUNTER — Inpatient Hospital Stay (HOSPITAL_COMMUNITY): Payer: Medicare HMO | Admitting: Physical Therapy

## 2013-09-22 ENCOUNTER — Encounter (HOSPITAL_COMMUNITY): Payer: Medicare HMO | Admitting: Occupational Therapy

## 2013-09-22 ENCOUNTER — Inpatient Hospital Stay (HOSPITAL_COMMUNITY): Payer: Medicare HMO

## 2013-09-22 LAB — GLUCOSE, CAPILLARY
GLUCOSE-CAPILLARY: 259 mg/dL — AB (ref 70–99)
Glucose-Capillary: 236 mg/dL — ABNORMAL HIGH (ref 70–99)
Glucose-Capillary: 304 mg/dL — ABNORMAL HIGH (ref 70–99)
Glucose-Capillary: 234 mg/dL — ABNORMAL HIGH (ref 70–99)

## 2013-09-22 MED ORDER — INSULIN GLARGINE 100 UNIT/ML ~~LOC~~ SOLN
20.0000 [IU] | Freq: Every day | SUBCUTANEOUS | Status: DC
  Administered 2013-09-22: 20 [IU] via SUBCUTANEOUS
  Filled 2013-09-22 (×2): qty 0.2

## 2013-09-22 MED ORDER — INSULIN GLARGINE 100 UNIT/ML ~~LOC~~ SOLN
25.0000 [IU] | Freq: Every day | SUBCUTANEOUS | Status: DC
  Filled 2013-09-22: qty 0.25

## 2013-09-22 NOTE — Progress Notes (Signed)
Speech Language Pathology Daily Session Note  Patient Details  Name: Brad Taylor MRN: [id number] Date of Birth: 1951/05/25  Today's Date: 09/22/2013 Time: 5366-4403 Time Calculation (min): 41 min  Short Term Goals: Week 1: SLP Short Term Goal 1 (Week 1): Patient will utilize speech intelligibility strategies at the sentnece level with Min vebral cues to self-monitor and correct errors. SLP Short Term Goal 2 (Week 1): Patient will utilize word finding strategies in conversation with Min verbal cues. SLP Short Term Goal 3 (Week 1): Patient will demonstrate problem solving with daily complex problems with Min verbal cues to self-monitor and correct errors.  Skilled Therapeutic Interventions: Skilled treatment focused on speech and cognitive goals. SLP facilitated session with supervision level verbal cues to utilize external memory aid to recall speech intelligibility strategies. Pt completed 6-step sequencing task with Min cues for comprehension of instructions and Mod cues for accurate sequencing. He required Min cues for use of speech strategies in conversation and throughout cognitive-linguistic task. Continue plan of care.   FIM:  Comprehension Comprehension Mode: Auditory Comprehension: 5-Follows basic conversation/direction: With extra time/assistive device Expression Expression Mode: Verbal Expression: 3-Expresses basic 50 - 74% of the time/requires cueing 25 - 50% of the time. Needs to repeat parts of sentences. Social Interaction Social Interaction: 5-Interacts appropriately 90% of the time - Needs monitoring or encouragement for participation or interaction. Problem Solving Problem Solving: 5-Solves basic problems: With no assist Memory Memory: 5-Recognizes or recalls 90% of the time/requires cueing < 10% of the time  Pain Pain Assessment Pain Assessment: No/denies pain  Therapy/Group: Individual Therapy   Germain Osgood, M.A. CCC-SLP [phone]   Germain Osgood 09/22/2013, 4:21 PM

## 2013-09-22 NOTE — Progress Notes (Addendum)
Subjective/Complaints: 63 y.o. male with history of COPD, diabetes mellitus and peripheral neuropathy, PTSD. Admitted 09/15/2013 with right-sided weakness and slurred speech. MRI of the brain showed acute ischemic infarct involving the posterior limb of the left internal capsule as well as additional subcentimeter ischemic infarct deep white matter of the left centrum semiovale and remote lacunar infarct bilateral basal ganglia. MRA of the head with no stenosis or aneurysm. Echocardiogram is pending. Carotid Dopplers with no ICA stenosis. Patient did not receive TPA. Neurology services consulted placed on aspirin/Plavix for CVA prophylaxis as well as subcutaneous Lovenox for DVT prophylaxis. Maintained on a regular consistency diet  Discussed home meds, indicates insulin was 60Units  Twice a day Review of Systems - Negative except R weakness  Objective: Vital Signs: Blood pressure 147/83, pulse 64, temperature 98 F (36.7 C), temperature source Oral, resp. rate 19, weight 125.4 kg (276 lb 7.3 oz), SpO2 94.00%. No results found. Results for orders placed during the hospital encounter of 09/20/13 (from the past 72 hour(s))  GLUCOSE, CAPILLARY     Status: Abnormal   Collection Time    09/20/13  5:08 PM      Result Value Ref Range   Glucose-Capillary 209 (*) 70 - 99 mg/dL   Comment 1 Notify RN    CBC     Status: None   Collection Time    09/20/13  7:40 PM      Result Value Ref Range   WBC 10.3  4.0 - 10.5 K/uL   RBC 5.09  4.22 - 5.81 MIL/uL   Hemoglobin 16.3  13.0 - 17.0 g/dL   HCT 45.6  39.0 - 52.0 %   MCV 89.6  78.0 - 100.0 fL   MCH 32.0  26.0 - 34.0 pg   MCHC 35.7  30.0 - 36.0 g/dL   RDW 12.8  11.5 - 15.5 %   Platelets 180  150 - 400 K/uL  CREATININE, SERUM     Status: None   Collection Time    09/20/13  7:40 PM      Result Value Ref Range   Creatinine, Ser 0.71  0.50 - 1.35 mg/dL   GFR calc non Af Amer >90  >90 mL/min   GFR calc Af Amer >90  >90 mL/min   Comment: (NOTE)     The  eGFR has been calculated using the CKD EPI equation.     This calculation has not been validated in all clinical situations.     eGFR's persistently <90 mL/min signify possible Chronic Kidney     Disease.  GLUCOSE, CAPILLARY     Status: Abnormal   Collection Time    09/20/13  9:14 PM      Result Value Ref Range   Glucose-Capillary 177 (*) 70 - 99 mg/dL  CBC WITH DIFFERENTIAL     Status: Abnormal   Collection Time    09/21/13  5:45 AM      Result Value Ref Range   WBC 10.1  4.0 - 10.5 K/uL   RBC 5.09  4.22 - 5.81 MIL/uL   Hemoglobin 16.2  13.0 - 17.0 g/dL   HCT 46.1  39.0 - 52.0 %   MCV 90.6  78.0 - 100.0 fL   MCH 31.8  26.0 - 34.0 pg   MCHC 35.1  30.0 - 36.0 g/dL   RDW 12.8  11.5 - 15.5 %   Platelets 191  150 - 400 K/uL   Neutrophils Relative % 48  43 - 77 %  Neutro Abs 4.9  1.7 - 7.7 K/uL   Lymphocytes Relative 36  12 - 46 %   Lymphs Abs 3.6  0.7 - 4.0 K/uL   Monocytes Relative 9  3 - 12 %   Monocytes Absolute 0.9  0.1 - 1.0 K/uL   Eosinophils Relative 7 (*) 0 - 5 %   Eosinophils Absolute 0.7  0.0 - 0.7 K/uL   Basophils Relative 1  0 - 1 %   Basophils Absolute 0.1  0.0 - 0.1 K/uL  COMPREHENSIVE METABOLIC PANEL     Status: Abnormal   Collection Time    09/21/13  5:45 AM      Result Value Ref Range   Sodium 135 (*) 137 - 147 mEq/L   Potassium 3.9  3.7 - 5.3 mEq/L   Chloride 92 (*) 96 - 112 mEq/L   CO2 28  19 - 32 mEq/L   Glucose, Bld 187 (*) 70 - 99 mg/dL   BUN 19  6 - 23 mg/dL   Creatinine, Ser 0.80  0.50 - 1.35 mg/dL   Calcium 9.5  8.4 - 10.5 mg/dL   Total Protein 7.0  6.0 - 8.3 g/dL   Albumin 3.7  3.5 - 5.2 g/dL   AST 27  0 - 37 U/L   ALT 28  0 - 53 U/L   Alkaline Phosphatase 75  39 - 117 U/L   Total Bilirubin 0.5  0.3 - 1.2 mg/dL   GFR calc non Af Amer >90  >90 mL/min   GFR calc Af Amer >90  >90 mL/min   Comment: (NOTE)     The eGFR has been calculated using the CKD EPI equation.     This calculation has not been validated in all clinical situations.      eGFR's persistently <90 mL/min signify possible Chronic Kidney     Disease.  GLUCOSE, CAPILLARY     Status: Abnormal   Collection Time    09/21/13  7:26 AM      Result Value Ref Range   Glucose-Capillary 181 (*) 70 - 99 mg/dL   Comment 1 Notify RN    GLUCOSE, CAPILLARY     Status: Abnormal   Collection Time    09/21/13 11:24 AM      Result Value Ref Range   Glucose-Capillary 237 (*) 70 - 99 mg/dL   Comment 1 Notify RN    GLUCOSE, CAPILLARY     Status: Abnormal   Collection Time    09/21/13  4:27 PM      Result Value Ref Range   Glucose-Capillary 259 (*) 70 - 99 mg/dL   Comment 1 Notify RN    GLUCOSE, CAPILLARY     Status: Abnormal   Collection Time    09/21/13  9:14 PM      Result Value Ref Range   Glucose-Capillary 215 (*) 70 - 99 mg/dL  GLUCOSE, CAPILLARY     Status: Abnormal   Collection Time    09/22/13  7:21 AM      Result Value Ref Range   Glucose-Capillary 236 (*) 70 - 99 mg/dL   Comment 1 Notify RN         General: No acute distress Mood and affect are appropriate Heart: Regular rate and rhythm no rubs murmurs or extra sounds Lungs: Clear to auscultation, breathing unlabored, no rales or wheezes Abdomen: Positive bowel sounds, soft nontender to palpation, nondistended Extremities: No clubbing, cyanosis, or edema Skin: No evidence of breakdown, no evidence of  rash Neurologic: Cranial nerves II through XII intact, motor strength is 5/5 in left deltoid, bicep, tricep, grip, hip flexor, knee extensors, ankle dorsiflexor and plantar flexor, 3- RUE, 4- R KE, 1/5 R ankle Sensory exam normal sensation to light touch and proprioception in bilateral upper and lower extremities Cerebellar exam normal finger to nose to finger as well as heel to shin in bilateral upper and lower extremities Musculoskeletal: Full range of motion in all 4 extremities. No joint swelling  Assessment/Plan: 1. Functional deficits secondary to embolic left internal capsule/deep white matter  infarct   which require 3+ hours per day of interdisciplinary therapy in a comprehensive inpatient rehab setting. Physiatrist is providing close team supervision and 24 hour management of active medical problems listed below. Physiatrist and rehab team continue to assess barriers to discharge/monitor patient progress toward functional and medical goals. FIM: FIM - Bathing Bathing Steps Patient Completed: Chest;Right Arm;Abdomen;Front perineal area;Right upper leg;Left upper leg Bathing: 3: Mod-Patient completes 5-7 18f10 parts or 50-74%  FIM - Upper Body Dressing/Undressing Upper body dressing/undressing steps patient completed: Put head through opening of pull over shirt/dress Upper body dressing/undressing: 2: Max-Patient completed 25-49% of tasks FIM - Lower Body Dressing/Undressing Lower body dressing/undressing: 1: Total-Patient completed less than 25% of tasks  FIM - TMusicianDevices: Grab bar or rail for support Toileting: 1: Total-Patient completed zero steps, helper did all 3  FIM - TRadio producerDevices: COncologistTransfers: 4-To toilet/BSC: Min A (steadying Pt. > 75%)  FIM - Bed/Chair Transfer Bed/Chair Transfer Assistive Devices: Arm rests;Bed rails Bed/Chair Transfer: 4: Supine > Sit: Min A (steadying Pt. > 75%/lift 1 leg);3: Bed > Chair or W/C: Mod A (lift or lower assist);3: Chair or W/C > Bed: Mod A (lift or lower assist)  FIM - Locomotion: Wheelchair Distance: 80 Locomotion: Wheelchair: 2: Travels 50 - 149 ft with minimal assistance (Pt.>75%) FIM - Locomotion: Ambulation Locomotion: Ambulation Assistive Devices: Walker - Hemi;Other (comment) (and no AD) Ambulation/Gait Assistance: 3: Mod assist Locomotion: Ambulation: 2: Travels 50 - 149 ft with moderate assistance (Pt: 50 - 74%)  Comprehension Comprehension Mode: Auditory Comprehension: 5-Understands complex 90% of the time/Cues < 10% of the  time  Expression Expression Mode: Verbal Expression: 5-Expresses basic needs/ideas: With no assist  Social Interaction Social Interaction: 5-Interacts appropriately 90% of the time - Needs monitoring or encouragement for participation or interaction.  Problem Solving Problem Solving: 5-Solves basic problems: With no assist  Memory Memory: 6-More than reasonable amt of time  Medical Problem List and Plan:  1. Left internal capsule/deep white matter infarcts felt to be embolic secondary to unknown source  2. DVT Prophylaxis/Anticoagulation: Subcutaneous Lovenox. Monitor platelet counts and any signs of bleeding  3. Pain Management: Tylenol as needed  4. Mood/PTSD/depression. Wellbutrin 150 mg daily, BuSpar 5 mg twice a day as needed, Depakote 1000 mg each bedtime, effexor 150 mg twice a day and trazodone 300 mg each bedtime  5. Neuropsych: This patient is not yet capable of making decisions on his own behalf.  6. Diabetes mellitus with peripheral neuropathy.uncontrolled titrating Lantus  Hemoglobin A1c 7.4. Currently on sliding scale insulin. Patient on Glucotrol XL 10 mg daily. Check blood sugars a.c. and at bedtime resume oral agents as directed  7. Hypertension. Lisinopril 10 mg day. Monitor with increased activity  8. Hypothyroidism. Synthroid .TSH 4.045  9. Hyperlipidemia. Lipitor  10. GERD. Protonix  11. Left lateral lower extremity ulcer. Followup wound care nurse with dressing changes  as directed/Unna boot  LOS (Days) 2 A FACE TO FACE EVALUATION WAS PERFORMED  KIRSTEINS,ANDREW E 09/22/2013, 10:02 AM

## 2013-09-22 NOTE — Progress Notes (Signed)
Physical Therapy Note  Patient Details  Name: Brad Taylor MRN: [id number] Date of Birth: 10-Dec-1950 Today's Date: 09/22/2013  1100-1155 (55 minutes) individual Pain: no reported pain Focus of treatment: Neuro re-ed RT LE; gait training; therapeutic exercise focused on activity tolerance Treatment: Pt up in wc upon arrival; wc mobility 120 feet X 2 min assist using left extremities; wc setup for transfers - vcs for right brake + assist with right legrest; stand/turn HW min assist ; sit to supine (mat) close SBA with increased effort right LE; supine to sit SBA ; neuro re-ed right LE- ankle pumps, heel slides, hip abduction, hip ER/IR; sit to stand X 10 from mat for quad strengthening; standing stepping up/down 4 inch step RT LE with HW support on left; Nustep Level 4 X 10 minutes for activity tolerance (perceived exertion = easy); gait-  80 feet HW min assist with decreased step length on right (50%); returned to room with all needs within reach.   Vlada Uriostegui,JIM 09/22/2013, 11:15 AM

## 2013-09-22 NOTE — Progress Notes (Signed)
Occupational Therapy Session Note  Patient Details  Name: Brad Taylor MRN: [id number] Date of Birth: Jul 17, 1951  Today's Date: 09/22/2013 Time: 1683-7290 Time Calculation (min): 47 min  Skilled Therapeutic Interventions/Progress Updates:    Pt went down to the therapy gym via wheelchair and transferred to the therapy mat with min assist stand pivot.  Once on the mat therapist worked on Clinical research associate using tilted stool.  Focus on active elbow extension and shoulder flexion.  He needed mod instructional cueing and facilitation to avoid moving his trunk forward while attempting to move the stool forward.  Pt able to push the stool forward with min guard assist.  Also exhibited gross digit flexion and slight digit extension as well to finish up session.  Encouraged pt to continue AAROM exercises given to him yesterday.    Therapy Documentation Precautions:  Precautions Precautions: Fall Precaution Comments: R sided weakness (RUE>RLE), dysarthria Restrictions Weight Bearing Restrictions: No  Pain: Pain Assessment Pain Assessment: No/denies pain ADL: See FIM for current functional status  Therapy/Group: Individual Therapy  Hiroshi Krummel OTR/L 09/22/2013, 3:50 PM

## 2013-09-22 NOTE — Care Management Note (Signed)
[facility] Individual Statement of Services  Patient Name:  Brad Taylor  Date:  09/22/2013  Welcome to the [facility].  Our goal is to provide you with an individualized program based on your diagnosis and situation, designed to meet your specific needs.  With this comprehensive rehabilitation program, you will be expected to participate in at least 3 hours of rehabilitation therapies Monday-Friday, with modified therapy programming on the weekends.  Your rehabilitation program will include the following services:  Physical Therapy (PT), Occupational Therapy (OT), Speech Therapy (ST), 24 hour per day rehabilitation nursing, Case Management (Social Worker), Rehabilitation Medicine, Nutrition Services and Pharmacy Services  Weekly team conferences will be held on Wednesday to discuss your progress.  Your Social Worker will talk with you frequently to get your input and to update you on team discussions.  Team conferences with you and your family in attendance may also be held.  Expected length of stay: 2 weeks  Overall anticipated outcome: Supervision/min level  Depending on your progress and recovery, your program may change. Your Social Worker will coordinate services and will keep you informed of any changes. Your Social Worker's name and contact numbers are listed  below.  The following services may also be recommended but are not provided by the [facility] will be made to provide these services after discharge if needed.  Arrangements include referral to agencies that provide these services.  Your insurance has been verified to be:  [employer]  Your primary doctor is:  Dr Jenna Luo  Pertinent information will be shared with your doctor and your insurance company.  Social Worker:  Ovidio Kin, [place] or ([phone]  Information discussed with and copy given to patient by: Elease Hashimoto, 09/22/2013, 2:09 PM

## 2013-09-22 NOTE — Progress Notes (Signed)
Social Work Assessment and Plan Social Work Assessment and Plan  Patient Details  Name: Brad Taylor MRN: [id number] Date of Birth: 04-19-1951  Today's Date: 09/22/2013  Problem List:  Patient Active Problem List   Diagnosis Date Noted  . CVA (cerebral infarction) 09/20/2013  . Stroke 09/15/2013   Past Medical History:  Past Medical History  Diagnosis Date  . COPD (chronic obstructive pulmonary disease)   . Diabetes mellitus without complication   . Hypothyroidism   . GERD (gastroesophageal reflux disease)   . Ulcer   . Post traumatic stress disorder   . Depression   . Hyperlipidemia    Past Surgical History:  Past Surgical History  Procedure Laterality Date  . Cholecystectomy    . Cataract extraction    . Knee surgery     Social History:  reports that he has been smoking Cigarettes.  He has been smoking about 1.00 pack per day. He quit smokeless tobacco use about 4 months ago. He reports that he does not drink alcohol or use illicit drugs.  Family / Support Systems Marital Status: Married Patient Roles: Spouse;Parent Spouse/Significant Other: Brad Taylor [phone] Children: Brad Taylor Dada [phone] Other Supports: Friends Anticipated Caregiver: Wife Ability/Limitations of Caregiver: Wife is healthy and has assisted him prior to admission Caregiver Availability: 24/7 Family Dynamics: Close knit family-and couple have good relationship who help one another.  Wife has been assisting pt with his bathing and dressing prior to admission.  She plans to be here often and participant in pt's care.  Social History Preferred language: English Religion: Baptist Cultural Background: No issues Education: [employer] Read: Yes Write: Yes Employment Status: Disabled Freight forwarder Issues: No issues Guardian/Conservator: None-according to MD pt is not capable of making his own decisions, will look toward wife to make any decisions while here.    Abuse/Neglect Physical Abuse: Denies Verbal Abuse: Denies Sexual Abuse: Denies Exploitation of patient/patient's resources: Denies Self-Neglect: Denies  Emotional Status Pt's affect, behavior adn adjustment status: Pt is motivated and wants to do well while here.  He does not want to burden his wife any more than necessary.  He is grateful for her and deosn't want to stress her.  He plans to work hard and do well here.  He eneds to be mobile since he is much larger than she. Recent Psychosocial Issues: other medical issues-has been able to overcome them and remain independent  Pyschiatric History: History of PTSD takes multiple meds for this but finds them helpful and will remain on the regiment.  He feels he is doing okay and the depression screen is not needed at this time.  Will monitor his coping while here and intervene if necessary. Substance Abuse History: tobacco continues to smoke-unsure if will quit but plans to try.  Aware of the resources available to him  Patient / Family Perceptions, Expectations & Goals Pt/Family understanding of illness & functional limitations: Pt and wife have a good understanding of his condition and deficits from this stroke.  He is improving and he is encouraged by this.  Both he and wife are hopeful he will do well here.  Wife plans to observe in therapies and see his progress. Premorbid pt/family roles/activities: Husband, Father, [name], Retired Nature conservation officer, etc Anticipated changes in roles/activities/participation: resume upon discharge. Pt/family expectations/goals: Pt states: " I want to get mobile while here, so I don't burden my wife."  Wife states: " I am hoping he will do well and regain his function, but am also available  to assist him if needed."  [employer]: Other (Comment) ([facility] follows) Premorbid Home Care/DME Agencies: None Transportation available at discharge: Wife Resource referrals recommended: Support group  (specify) ([facility] Support group)  Discharge Planning Living Arrangements: Spouse/significant other Support Systems: Spouse/significant other;Children;Friends/neighbors;Church/faith community Type of Residence: Private residence Insurance Resources: Multimedia programmer (specify) ([place]) Financial Resources: Social Security;Other (Comment) Geophysical data processor pension) Financial Screen Referred: No Living Expenses: Lives with family Money Management: Spouse Does the patient have any problems obtaining your medications?: No Home Management: Wife Patient/Family Preliminary Plans: Return home with wife providing care if needed.  She assists him with his bathing and dressing due to war injury.  She is here often and will be involved.  Pleased with target date and goals of his rehab stay. Social Work Anticipated Follow Up Needs: HH/OP;Support Group  Clinical Impression Pleasant gentleman who is motivated to improve and regain his independence.  Supportive wife who is willing to assist and will participate when needed.  Pt should do well here.  Elease Hashimoto 09/22/2013, 2:26 PM

## 2013-09-22 NOTE — Progress Notes (Signed)
Social Work Patient ID: Brad Taylor, male   DOB: October 20, 1950, 63 y.o.   MRN: [id number] Met with pt and wife to inform of team conference goals-supervision/min level and discharge 3/3.  Both are pleased with how well he is doing and can See the progress he has made already.  Wife is very hands on and will be here to attend therapies with pt.  Discussed follow up and she prefers home health since Weather and time of year, then transition to OP therapies.  Will continue to work on discharge needs.

## 2013-09-22 NOTE — Progress Notes (Signed)
Occupational Therapy Session Note  Patient Details  Name: Brad Taylor MRN: [id number] Date of Birth: [birth year]  Today's Date: 09/22/2013 Time: 1100-1155 Time Calculation (min): 55 min  Short Term Goals: Week 1:  OT Short Term Goal 1 (Week 1): Pt will perform all bathing with min assist using AE for sit to stand.   OT Short Term Goal 2 (Week 1): Pt will donn pullover shirt with min assist following hemi technques. OT Short Term Goal 3 (Week 1): Pt will donn pants and shoes with AE and min assist. OT Short Term Goal 4 (Week 1): Pt will perform toilet transfers with supervision. OT Short Term Goal 5 (Week 1): Pt/family will return demonstrate AAROM/PROM exercises for the RUE.  Skilled Therapeutic Interventions/Progress Updates:    Pt performed bathing and dressing sit to stand at the sink.  He needs min instructional cueing and total assist to integrate the LUE for bathing tasks.  Pt frequently trying to use the RUE throughout session but presents only slight finger flexion and shoulder movement.  Pt utilized reacher for doffing socks with mod instructional cueing and min assist.  Also provided long handle sponge for pt to use for washing his feet.  Pt requires assistance to perform peri washing as he cannot reach his buttocks with the LUE.  Min assist for sit to stand when donning brief.  Therapist assisted with TEDs and shoes secondary to time.  Pt able to complete combing his hair and brushing his teeth from wheelchair level.    Therapy Documentation Precautions:  Precautions Precautions: Fall Precaution Comments: R sided weakness (RUE>RLE), dysarthria Restrictions Weight Bearing Restrictions: No  Pain: Pain Assessment Pain Assessment: No/denies pain ADL: See FIM for current functional status  Therapy/Group: Individual Therapy  Emy Angevine OTR/L 09/22/2013, 11:17 AM

## 2013-09-22 NOTE — Progress Notes (Signed)
Inpatient Diabetes Program Recommendations  AACE/ADA: New Consensus Statement on Inpatient Glycemic Control (2013)  Target Ranges:  Prepandial:   less than 140 mg/dL      Peak postprandial:   less than 180 mg/dL (1-2 hours)      Critically ill patients:  140 - 180 mg/dL     Results for Brad Taylor, Brad Taylor (MRN [id number]) as of 09/22/2013 12:44  Ref. Range 09/21/2013 07:26 09/21/2013 11:24 09/21/2013 16:27 09/21/2013 21:14  Glucose-Capillary Latest Range: 70-99 mg/dL 181 (H) 237 (H) 259 (H) 215 (H)    Results for Brad Taylor, Brad Taylor (MRN [id number]) as of 09/22/2013 12:44  Ref. Range 09/22/2013 07:21 09/22/2013 12:01  Glucose-Capillary Latest Range: 70-99 mg/dL 236 (H) 304 (H)    **Note patient is having elevated glucose levels.   **MD- Please consider the following in-hospital insulin adjustments:  1. Increase Lantus to 30 units QHS 2. Add Novolog meal coverage- Novolog 4 units tid with meals   Will follow. Wyn Quaker RN, MSN, CDE Diabetes Coordinator Inpatient Diabetes Program Team Pager: [phone] (8a-10p)

## 2013-09-22 NOTE — Patient Care Conference (Signed)
Inpatient RehabilitationTeam Conference and Plan of Care Update Date: 09/22/2013   Time: 11;15 AM    Patient Name: Brad Taylor      Medical Record Number: [id number]  Date of Birth: Mar 25, 1951 Sex: Male         Room/Bed: 4M05C/4M05C-01 Payor Info: Payor: [employer] MEDICARE / Plan: [employer] MEDICARE HMO / Product Type: *No Product type* /    Admitting Diagnosis: CVA  Admit Date/Time:  09/20/2013  4:46 PM Admission Comments: No comment available   Primary Diagnosis:  <principal problem not specified> Principal Problem: <principal problem not specified>  Patient Active Problem List   Diagnosis Date Noted  . CVA (cerebral infarction) 09/20/2013  . Stroke 09/15/2013    Expected Discharge Date: Expected Discharge Date: 10/05/13  Team Members Present: Physician leading conference: Dr. Alysia Penna Social Worker Present: Alfonse Alpers, LCSW;Becky Jubal Rademaker, LCSW Nurse Present: Other (comment) Gardiner Fanti) PT Present: Raylene Everts, PT OT Present: Simonne Come, OT;Kris Nira Retort, OT SLP Present: Gunnar Fusi, SLP PPS Coordinator present : Ileana Ladd, Lelan Pons, RN, CRRN     Current Status/Progress Goal Weekly Team Focus  Medical   poor flex, oral apraxia, Some RUE return  Supervision goals  work on flexibility, adaptive equipment   Bowel/Bladder   Pt continent of bowel . Removal of condom cath 2/17 with pt having incont. episodes. Pt has uragncy with bladder  Continent of B/B  timed tolieting q2-3hrs.    Swallow/Nutrition/ Hydration   regular and thin          ADL's   min to mod assist for UB selfcare tasks, min assist LB bathing and max assist for LB dressing.  Needs min assist for sit to stand and mod assist for mobility to and from the toilet.  Brunnstrum stage II in the right arm and hand.  overall supervision for transfers, toilet hygiene, UB dressing, min assist for UB bathing and LB dressing  Selfcare retraining, LUE coordination and strengthening,  neuro re-education, standing balance   Mobility   Pt requires min assist for bed mobility, min/mod assist for standing, stand pivot transfers, and min/mod assist with use of hemi walker, mod assist for gait without AD.  Supervision overall  NMR for RUE/LE, balance, coordination, gait   Communication   Mod assist   Supervision   increase use of intelligibility strategies   Safety/Cognition/ Behavioral Observations  Min assist   Supervision with complex  increase self-monitoring and correcting with math calculations   Pain   no complaints of pain         Skin   BLE- discolored with venous ulcer to LLE. WOC consulted with new oreders to arise. Wife of pt refuses order of unna boot.    no further skin breakdown and stay free from infection  assess skin qshift, change dressing as ordered.      *See Care Plan and progress notes for long and short-term goals.  Barriers to Discharge: see above    Possible Resolutions to Barriers:  Cont Rehab    Discharge Planning/Teaching Needs:  Home with wife who has been assisting with his care PTA, can provide 24 hr care      Team Discussion:  New eval-WOC saw to address leg wound.  MBS-regular thin liquids. Goals-supervision/min level  Revisions to Treatment Plan:  None   Continued Need for Acute Rehabilitation Level of Care: The patient requires daily medical management by a physician with specialized training in physical medicine and rehabilitation for the following conditions:  Daily direction of a multidisciplinary physical rehabilitation program to ensure safe treatment while eliciting the highest outcome that is of practical value to the patient.: Yes Daily medical management of patient stability for increased activity during participation in an intensive rehabilitation regime.: Yes Daily analysis of laboratory values and/or radiology reports with any subsequent need for medication adjustment of medical intervention for : Neurological  problems  Keasia Dubose, Gardiner Rhyme 09/22/2013, 2:30 PM

## 2013-09-23 ENCOUNTER — Inpatient Hospital Stay (HOSPITAL_COMMUNITY): Payer: Medicare HMO | Admitting: Rehabilitation

## 2013-09-23 ENCOUNTER — Inpatient Hospital Stay (HOSPITAL_COMMUNITY): Payer: Medicare HMO | Admitting: Speech Pathology

## 2013-09-23 ENCOUNTER — Encounter (HOSPITAL_COMMUNITY): Payer: Medicare HMO | Admitting: Occupational Therapy

## 2013-09-23 DIAGNOSIS — E1149 Type 2 diabetes mellitus with other diabetic neurological complication: Secondary | ICD-10-CM

## 2013-09-23 DIAGNOSIS — I6340 Cerebral infarction due to embolism of unspecified cerebral artery: Secondary | ICD-10-CM

## 2013-09-23 DIAGNOSIS — J4489 Other specified chronic obstructive pulmonary disease: Secondary | ICD-10-CM

## 2013-09-23 DIAGNOSIS — E1142 Type 2 diabetes mellitus with diabetic polyneuropathy: Secondary | ICD-10-CM

## 2013-09-23 DIAGNOSIS — J449 Chronic obstructive pulmonary disease, unspecified: Secondary | ICD-10-CM

## 2013-09-23 LAB — GLUCOSE, CAPILLARY
GLUCOSE-CAPILLARY: 195 mg/dL — AB (ref 70–99)
GLUCOSE-CAPILLARY: 221 mg/dL — AB (ref 70–99)
Glucose-Capillary: 226 mg/dL — ABNORMAL HIGH (ref 70–99)
Glucose-Capillary: 181 mg/dL — ABNORMAL HIGH (ref 70–99)
Glucose-Capillary: 238 mg/dL — ABNORMAL HIGH (ref 70–99)

## 2013-09-23 LAB — CK TOTAL AND CKMB (NOT AT ARMC)
CK TOTAL: 116 U/L (ref 7–232)
CK, MB: 1.4 ng/mL (ref 0.3–4.0)
RELATIVE INDEX: 1.2 (ref 0.0–2.5)

## 2013-09-23 LAB — TROPONIN I
Troponin I: <0.3 ng/mL (ref <0.30)
Troponin I: <0.3 ng/mL (ref <0.30)

## 2013-09-23 MED ORDER — INSULIN GLARGINE 100 UNIT/ML ~~LOC~~ SOLN
25.0000 [IU] | Freq: Every day | SUBCUTANEOUS | Status: DC
  Administered 2013-09-23: 25 [IU] via SUBCUTANEOUS
  Filled 2013-09-23 (×3): qty 0.25

## 2013-09-23 NOTE — Progress Notes (Signed)
SLP Cancellation Note  Patient Details Name: Brad Taylor MRN: [id number] DOB: April 01, 1951   Cancelled treatment:       Amount of Missed SLP Time (min): 81 Minutes                                                                                           Upon SLP arrival to room patient asleep with wife at bedside.  Wife reported that he had not slept well last night and had been tired all day.  Patient awoke easily but appear groggy and lethargic.  SLP asked specific question regarding how patient felt and he was unable to answer, but repeated SLP's statements.  Patient with significant difficulty communicating as compared to previous session.  As a result, R.N. was notified and said he would check vitals.  While SLP waited with patient he grabbed his left side of chest and responded yes to questioning regarding chest pain.  R.N. notified immediately.         Gunnar Fusi, M.A., CCC-SLP [phone]  [place] 09/23/2013, 3:04 PM

## 2013-09-23 NOTE — Progress Notes (Signed)
Occupational Therapy Session Note  Patient Details  Name: Brad Taylor MRN: [id number] Date of Birth: [birth year]  Today's Date: 09/23/2013 Time: 0904-1001 Time Calculation (min): 57 min  Short Term Goals: Week 1:  OT Short Term Goal 1 (Week 1): Pt will perform all bathing with min assist using AE for sit to stand.   OT Short Term Goal 2 (Week 1): Pt will donn pullover shirt with min assist following hemi technques. OT Short Term Goal 3 (Week 1): Pt will donn pants and shoes with AE and min assist. OT Short Term Goal 4 (Week 1): Pt will perform toilet transfers with supervision. OT Short Term Goal 5 (Week 1): Pt/family will return demonstrate AAROM/PROM exercises for the RUE.  Skilled Therapeutic Interventions/Progress Updates:    Pt reported being cold last night and having frequent bladder incontinence sessions as well.  Decided to perform bathing and dressing sit to stand at the sink this session.  Pt with frequent attempts to incorporate the RUE into bathing but still needs max assist to do so.  At times pt with decreased problem solving when attempting to use the reacher or LH sponge.  Has trouble manipulating them with the left hand.  During session pt demonstrated 2 episodes of bladder incontinence.  Had him donn incontinence brief using reacher to place it over his feet.  He needed min assist for sit to stand and pulling it over his hips.  Pt with abdominal cramp on the left side of his abdomen.  Nursing made aware and cramp subsided after performing standing.  Pt needed mod instructional cueing for hemi techniques for donning pullover shirt.  Pt tends to try and start by threading his stronger left arm first.  He continues to need assist with cleaning his peri area secondary to not being able to reach it and clean thoroughly.  Utilized reacher during session for doffing gripper socks and donning his velcro shoes.  Still needs min assist for standing balance with one episode of falling  posteriorly noted during session.    Therapy Documentation Precautions:  Precautions Precautions: Fall Precaution Comments: R sided weakness (RUE>RLE), dysarthria Restrictions Weight Bearing Restrictions: No  Pain: Pain Assessment Pain Assessment: Faces Faces Pain Scale: Hurts a little bit Pain Location: Abdomen Pain Orientation: Left Pain Descriptors / Indicators: Cramping Pain Intervention(s): Repositioned;Emotional support ADL: See FIM for current functional status  Therapy/Group: Individual Therapy  Kimberlyn Quiocho OTR/L 09/23/2013, 11:37 AM

## 2013-09-23 NOTE — Progress Notes (Signed)
Subjective/Complaints: 63 y.o. male with history of COPD, diabetes mellitus and peripheral neuropathy, PTSD. Admitted 09/15/2013 with right-sided weakness and slurred speech. MRI of the brain showed acute ischemic infarct involving the posterior limb of the left internal capsule as well as additional subcentimeter ischemic infarct deep white matter of the left centrum semiovale and remote lacunar infarct bilateral basal ganglia. MRA of the head with no stenosis or aneurysm. Echocardiogram is pending. Carotid Dopplers with no ICA stenosis. Patient did not receive TPA. Neurology services consulted placed on aspirin/Plavix for CVA prophylaxis as well as subcutaneous Lovenox for DVT prophylaxis. Maintained on a regular consistency diet  Patient complains of a cold room last night. No other new problems noted. Review of Systems - Negative except R weakness  Objective: Vital Signs: Blood pressure 128/76, pulse 65, temperature 97.7 F (36.5 C), temperature source Oral, resp. rate 18, weight 125.4 kg (276 lb 7.3 oz), SpO2 95.00%. No results found. Results for orders placed during the hospital encounter of 09/20/13 (from the past 72 hour(s))  GLUCOSE, CAPILLARY     Status: Abnormal   Collection Time    09/20/13  5:08 PM      Result Value Ref Range   Glucose-Capillary 209 (*) 70 - 99 mg/dL   Comment 1 Notify RN    CBC     Status: None   Collection Time    09/20/13  7:40 PM      Result Value Ref Range   WBC 10.3  4.0 - 10.5 K/uL   RBC 5.09  4.22 - 5.81 MIL/uL   Hemoglobin 16.3  13.0 - 17.0 g/dL   HCT 45.6  39.0 - 52.0 %   MCV 89.6  78.0 - 100.0 fL   MCH 32.0  26.0 - 34.0 pg   MCHC 35.7  30.0 - 36.0 g/dL   RDW 12.8  11.5 - 15.5 %   Platelets 180  150 - 400 K/uL  CREATININE, SERUM     Status: None   Collection Time    09/20/13  7:40 PM      Result Value Ref Range   Creatinine, Ser 0.71  0.50 - 1.35 mg/dL   GFR calc non Af Amer >90  >90 mL/min   GFR calc Af Amer >90  >90 mL/min   Comment:  (NOTE)     The eGFR has been calculated using the CKD EPI equation.     This calculation has not been validated in all clinical situations.     eGFR's persistently <90 mL/min signify possible Chronic Kidney     Disease.  GLUCOSE, CAPILLARY     Status: Abnormal   Collection Time    09/20/13  9:14 PM      Result Value Ref Range   Glucose-Capillary 177 (*) 70 - 99 mg/dL  CBC WITH DIFFERENTIAL     Status: Abnormal   Collection Time    09/21/13  5:45 AM      Result Value Ref Range   WBC 10.1  4.0 - 10.5 K/uL   RBC 5.09  4.22 - 5.81 MIL/uL   Hemoglobin 16.2  13.0 - 17.0 g/dL   HCT 46.1  39.0 - 52.0 %   MCV 90.6  78.0 - 100.0 fL   MCH 31.8  26.0 - 34.0 pg   MCHC 35.1  30.0 - 36.0 g/dL   RDW 12.8  11.5 - 15.5 %   Platelets 191  150 - 400 K/uL   Neutrophils Relative % 48  43 - 77 %  Neutro Abs 4.9  1.7 - 7.7 K/uL   Lymphocytes Relative 36  12 - 46 %   Lymphs Abs 3.6  0.7 - 4.0 K/uL   Monocytes Relative 9  3 - 12 %   Monocytes Absolute 0.9  0.1 - 1.0 K/uL   Eosinophils Relative 7 (*) 0 - 5 %   Eosinophils Absolute 0.7  0.0 - 0.7 K/uL   Basophils Relative 1  0 - 1 %   Basophils Absolute 0.1  0.0 - 0.1 K/uL  COMPREHENSIVE METABOLIC PANEL     Status: Abnormal   Collection Time    09/21/13  5:45 AM      Result Value Ref Range   Sodium 135 (*) 137 - 147 mEq/L   Potassium 3.9  3.7 - 5.3 mEq/L   Chloride 92 (*) 96 - 112 mEq/L   CO2 28  19 - 32 mEq/L   Glucose, Bld 187 (*) 70 - 99 mg/dL   BUN 19  6 - 23 mg/dL   Creatinine, Ser 0.80  0.50 - 1.35 mg/dL   Calcium 9.5  8.4 - 10.5 mg/dL   Total Protein 7.0  6.0 - 8.3 g/dL   Albumin 3.7  3.5 - 5.2 g/dL   AST 27  0 - 37 U/L   ALT 28  0 - 53 U/L   Alkaline Phosphatase 75  39 - 117 U/L   Total Bilirubin 0.5  0.3 - 1.2 mg/dL   GFR calc non Af Amer >90  >90 mL/min   GFR calc Af Amer >90  >90 mL/min   Comment: (NOTE)     The eGFR has been calculated using the CKD EPI equation.     This calculation has not been validated in all clinical  situations.     eGFR's persistently <90 mL/min signify possible Chronic Kidney     Disease.  GLUCOSE, CAPILLARY     Status: Abnormal   Collection Time    09/21/13  7:26 AM      Result Value Ref Range   Glucose-Capillary 181 (*) 70 - 99 mg/dL   Comment 1 Notify RN    GLUCOSE, CAPILLARY     Status: Abnormal   Collection Time    09/21/13 11:24 AM      Result Value Ref Range   Glucose-Capillary 237 (*) 70 - 99 mg/dL   Comment 1 Notify RN    GLUCOSE, CAPILLARY     Status: Abnormal   Collection Time    09/21/13  4:27 PM      Result Value Ref Range   Glucose-Capillary 259 (*) 70 - 99 mg/dL   Comment 1 Notify RN    GLUCOSE, CAPILLARY     Status: Abnormal   Collection Time    09/21/13  9:14 PM      Result Value Ref Range   Glucose-Capillary 215 (*) 70 - 99 mg/dL  GLUCOSE, CAPILLARY     Status: Abnormal   Collection Time    09/22/13  7:21 AM      Result Value Ref Range   Glucose-Capillary 236 (*) 70 - 99 mg/dL   Comment 1 Notify RN    GLUCOSE, CAPILLARY     Status: Abnormal   Collection Time    09/22/13 12:01 PM      Result Value Ref Range   Glucose-Capillary 304 (*) 70 - 99 mg/dL  GLUCOSE, CAPILLARY     Status: Abnormal   Collection Time    09/22/13  4:06 PM  Result Value Ref Range   Glucose-Capillary 259 (*) 70 - 99 mg/dL   Comment 1 Notify RN    GLUCOSE, CAPILLARY     Status: Abnormal   Collection Time    09/22/13  9:00 PM      Result Value Ref Range   Glucose-Capillary 234 (*) 70 - 99 mg/dL  GLUCOSE, CAPILLARY     Status: Abnormal   Collection Time    09/23/13  7:32 AM      Result Value Ref Range   Glucose-Capillary 195 (*) 70 - 99 mg/dL   Comment 1 Notify RN         General: No acute distress Mood and affect are appropriate Heart: Regular rate and rhythm no rubs murmurs or extra sounds Lungs: Clear to auscultation, breathing unlabored, no rales or wheezes Abdomen: Positive bowel sounds, soft nontender to palpation, nondistended Extremities: No clubbing,  cyanosis, or edema Skin: No evidence of breakdown, no evidence of rash Neurologic: Cranial nerves II through XII intact, motor strength is 5/5 in left deltoid, bicep, tricep, grip, hip flexor, knee extensors, ankle dorsiflexor and plantar flexor, 3- RUE, 4- R KE, 1/5 R ankle Sensory exam Cerebellar exam normal finger to nose to finger as well as heel to shin in bilateral upper and lower extremities Musculoskeletal: Full range of motion in all 4 extremities. No joint swelling  Assessment/Plan: 1. Functional deficits secondary to embolic left internal capsule/deep white matter infarct   which require 3+ hours per day of interdisciplinary therapy in a comprehensive inpatient rehab setting. Physiatrist is providing close team supervision and 24 hour management of active medical problems listed below. Physiatrist and rehab team continue to assess barriers to discharge/monitor patient progress toward functional and medical goals. FIM: FIM - Bathing Bathing Steps Patient Completed: Chest;Right Arm;Abdomen;Front perineal area;Right upper leg;Left upper leg;Right lower leg (including foot);Left lower leg (including foot) Bathing: 4: Steadying assist  FIM - Upper Body Dressing/Undressing Upper body dressing/undressing steps patient completed: Put head through opening of pull over shirt/dress;Thread/unthread left sleeve of pullover shirt/dress;Pull shirt over trunk Upper body dressing/undressing: 4: Min-Patient completed 75 plus % of tasks FIM - Lower Body Dressing/Undressing Lower body dressing/undressing steps patient completed: Don/Doff right sock;Don/Doff left sock Lower body dressing/undressing: 2: Max-Patient completed 25-49% of tasks  FIM - Musician Devices: Grab bar or rail for support Toileting: 1: Total-Patient completed zero steps, helper did all 3  FIM - Radio producer Devices: Oncologist Transfers: 4-To toilet/BSC: Min A (steadying  Pt. > 75%)  FIM - Bed/Chair Transfer Bed/Chair Transfer Assistive Devices: Arm rests;Bed rails Bed/Chair Transfer: 4: Supine > Sit: Min A (steadying Pt. > 75%/lift 1 leg);3: Bed > Chair or W/C: Mod A (lift or lower assist);3: Chair or W/C > Bed: Mod A (lift or lower assist)  FIM - Locomotion: Wheelchair Distance: 80 Locomotion: Wheelchair: 2: Travels 50 - 149 ft with minimal assistance (Pt.>75%) FIM - Locomotion: Ambulation Locomotion: Ambulation Assistive Devices: Walker - Hemi;Other (comment) (and no AD) Ambulation/Gait Assistance: 3: Mod assist Locomotion: Ambulation: 2: Travels 50 - 149 ft with moderate assistance (Pt: 50 - 74%)  Comprehension Comprehension Mode: Auditory Comprehension: 5-Follows basic conversation/direction: With extra time/assistive device  Expression Expression Mode: Verbal Expression: 3-Expresses basic 50 - 74% of the time/requires cueing 25 - 50% of the time. Needs to repeat parts of sentences.  Social Interaction Social Interaction: 5-Interacts appropriately 90% of the time - Needs monitoring or encouragement for participation or interaction.  Problem Solving Problem  Solving: 5-Solves basic problems: With no assist  Memory Memory: 5-Recognizes or recalls 90% of the time/requires cueing < 10% of the time  Medical Problem List and Plan:  1. Left internal capsule/deep white matter infarcts felt to be embolic secondary to unknown source  2. DVT Prophylaxis/Anticoagulation: Subcutaneous Lovenox. Monitor platelet counts and any signs of bleeding  3. Pain Management: Tylenol as needed  4. Mood/PTSD/depression. Wellbutrin 150 mg daily, BuSpar 5 mg twice a day as needed, Depakote 1000 mg each bedtime, effexor 150 mg twice a day and trazodone 300 mg each bedtime  5. Neuropsych: This patient is not yet capable of making decisions on his own behalf.  6. Diabetes mellitus with peripheral neuropathy.uncontrolled titrating Lantus  Hemoglobin A1c 7.4. Currently on  sliding scale insulin. Patient on Glucotrol XL 10 mg daily. Check blood sugars a.c. and at bedtime resume oral agents as directed  7. Hypertension. Lisinopril 10 mg day. Monitor with increased activity  8. Hypothyroidism. Synthroid .TSH 4.045  9. Hyperlipidemia. Lipitor  10. GERD. Protonix  11. Left lateral lower extremity ulcer. Followup wound care nurse with dressing changes as directed/Unna boot  LOS (Days) 3 A FACE TO FACE EVALUATION WAS PERFORMED  Tiajuana Leppanen E 09/23/2013, 10:55 AM

## 2013-09-23 NOTE — IPOC Note (Signed)
Overall Plan of Care [facility]) Patient Details Name: Brad Taylor MRN: [id number] DOB: [birth year]  Admitting Diagnosis: CVA  Hospital Problems: Active Problems:   CVA (cerebral infarction)     Functional Problem List: Nursing Bladder;Edema;Medication Management;Perception;Safety;Skin Integrity;Sensory  PT Balance;Endurance;Motor;Safety  OT Balance;Motor;Safety;Vision  SLP Cognition;Linguistic  TR         Basic ADL's: OT Eating;Grooming;Bathing;Dressing;Toileting     Advanced  ADL's: OT       Transfers: PT Bed Mobility;Bed to Chair;Car;Furniture;Floor  OT Toilet;Tub/Shower     Locomotion: PT Ambulation;Wheelchair Mobility;Stairs     Additional Impairments: OT Fuctional Use of Upper Extremity  SLP Communication;Social Cognition expression Problem Solving;Memory  TR      Anticipated Outcomes Item Anticipated Outcome  Self Feeding modified independent  Swallowing      Basic self-care  min assist level  Toileting  supervision   Bathroom Transfers supervision  Bowel/Bladder  continent of bowel and bladder  Transfers  supervision  Locomotion  supervision  Communication  Supervision   Cognition  Supervision   Pain  3 or less  Safety/Judgment  Min assist   Therapy Plan: PT Intensity: Minimum of 1-2 x/day ,45 to 90 minutes PT Frequency: 5 out of 7 days PT Duration Estimated Length of Stay: 14-16 days OT Intensity: Minimum of 1-2 x/day, 45 to 90 minutes OT Frequency: 5 out of 7 days OT Duration/Estimated Length of Stay: 14-16 days SLP Intensity: Minumum of 1-2 x/day, 30 to 90 minutes SLP Frequency: 5 out of 7 days SLP Duration/Estimated Length of Stay: 14-16 days       Team Interventions: Nursing Interventions Patient/Family Education 63;Bladder Management;Disease Management/Prevention;Medication Management;Skin Care/Wound Management  PT interventions Ambulation/gait training;Balance/vestibular training;Community reintegration;Discharge  planning;DME/adaptive equipment instruction;Functional mobility training;Neuromuscular re-education;Patient/family education;Stair training;Therapeutic Activities;Therapeutic Exercise;UE/LE Strength taining/ROM;UE/LE Coordination activities;Visual/perceptual remediation/compensation;Wheelchair propulsion/positioning  OT Interventions Balance/vestibular training;Community reintegration;Cognitive remediation/compensation;Discharge planning;Functional mobility training;Functional electrical stimulation;DME/adaptive equipment instruction;Neuromuscular re-education;Patient/family education;Therapeutic Activities;Splinting/orthotics;Self Care/advanced ADL retraining;Therapeutic Exercise;UE/LE Strength taining/ROM;UE/LE Coordination activities;Visual/perceptual remediation/compensation  SLP Interventions Cognitive remediation/compensation;Cueing hierarchy;Functional tasks;Internal/external aids;Patient/family education;Speech/Language facilitation;Therapeutic Activities  TR Interventions    SW/CM Interventions Discharge Planning;Psychosocial Support;Patient/Family Education 63    Team Discharge Planning: Destination: PT-Home ,OT- Home , SLP-Home Projected Follow-up: PT-Outpatient PT;24 hour supervision/assistance, OT-  Outpatient OT, SLP-24 hour supervision/assistance;Outpatient SLP Projected Equipment Needs: PT-To be determined, OT- None recommended by OT, SLP-None recommended by SLP Equipment Details: PT- , OT-  Patient/family involved in discharge planning: PT- Patient;Family member/caregiver,  OT-Patient;Family member/caregiver, SLP-Patient;Family member/caregiver  MD ELOS: [phone] Medical Rehab Prognosis:  Good Assessment: 63 y.o. male with history of COPD, diabetes mellitus and peripheral neuropathy, PTSD. Admitted 09/16/2063 with right-sided weakness and slurred speech. MRI of the brain showed acute ischemic infarct involving the posterior limb of the left internal capsule as well as additional  subcentimeter ischemic infarct deep white matter of the left centrum semiovale and remote lacunar infarct bilateral basal ganglia. MRA of the head with no stenosis or aneurysm. Echocardiogram is pending. Carotid Dopplers with no ICA stenosis. Patient did not receive TPA. Neurology services consulted placed on aspirin/Plavix for CVA prophylaxis as well as subcutaneous Lovenox for DVT prophylaxis. Maintained on a regular consistency diet. Followup wound care nurse for left lateral lower extremity ulcer with dressing changes as advised with Unna boot  Now requiring 24/7 Rehab RN,MD, as well as CIR level PT, OT and SLP.  Treatment team will focus on ADLs and mobility with goals set at Supervision   See Team Conference Notes for weekly updates to the plan of care

## 2013-09-23 NOTE — Progress Notes (Signed)
Physical Therapy Note  Patient Details  Name: Brad Taylor MRN: [id number] Date of Birth: Dec 13, 1950 Today's Date: 09/23/2013  Pt missed 30 mins of skilled PT this afternoon.  Pt with change in status and c/o chest pain during previous SLP session.  Went to speak with pt and wife and note that pt is still very fatigued, unable to articulate and seems disoriented.  RN aware, spoke with RN again to re-iterate change in pts status.     Denice Bors 09/23/2013, 4:07 PM

## 2013-09-23 NOTE — Progress Notes (Signed)
09/23/13 1506  Vitals  BP 139/88 mmHg  BP Location Left arm  BP Method Automatic  Patient Position, if appropriate Lying  Pulse Rate 96  Pulse Rate Source Dinamap  Resp 18  Oxygen Therapy  SpO2 95 %  O2 Device None (Room air)   Pt was drowsy and have a sudden chest pain. VS were taken (Above), EKG was done , Linna Hoff Angiulli was made aware. Cardiac enzyme were ordered  STAT and was drawn. Patient is in bed and family is on the bedside

## 2013-09-23 NOTE — Progress Notes (Signed)
Physical Therapy Session Note  Patient Details  Name: Brad Taylor MRN: [id number] Date of Birth: 1951-03-10  Today's Date: 09/23/2013 Time: 1015-1110 Time Calculation (min): 55 min  Short Term Goals: Week 1:  PT Short Term Goal 1 (Week 1): Pt will perform stand pivot transfers with LRAD at min assist level PT Short Term Goal 2 (Week 1): Pt will perform standing balance activities at min assist level PT Short Term Goal 3 (Week 1): Pt will self propel w/c using L hemi technique at supervision level x 100' PT Short Term Goal 4 (Week 1): Pt will ambulate x 100' with LRAD at min assist level.   Skilled Therapeutic Interventions/Progress Updates:   Pt received sitting in w/c in room, agreeable to therapy.  States that he had just finished with speech therapy and that he was told that if he slows down and takes his time, the better his speech will be.  Practiced with me in room, and note that speech was markedly improved with this technique.  Demonstrates good carryover from SLP session.  Pt self propelled x 70' using BLEs and LUE at min assist level.  Mod verbal and demonstration cues to continue to use RLE, as he would tend to stop using intermittently during task.  Assisted remainder of distance to gym.  While in therapy gym, performed 40' gait training x 2, one with use of hemi walker at min assist with continued verbal and tapping cues for correct sequencing and technique and also mod verbal cues for increased step length on RLE.  Note pt with increased difficulty with sequencing and maintaining large steps with RLE, therefore on way back to w/c, had pt ambulate without use of hemi walker.  Continues to require min assist, however pt doing much better with increased step length on RLE.  Performed standing stepping to 4" step with LLE to increase weight shift and WB through RLE. Pt requires min to mod assist at times to maintain balance with verbal and tactile cues for increased quad and glute  activation on RLE.  Progressed to performing standing x 5 reps with small block under LLE to increase WB through RLE during standing. Pt requires mod to max assist at times due to increased fatigue and bouts of coughing.  Ended session with tall kneeling activity for NMR through RUE/LE. See details below.  Pt returned to room and left in chair with wife present.  All needs in reach.   Therapy Documentation Precautions:  Precautions Precautions: Fall Precaution Comments: R sided weakness (RUE>RLE), dysarthria Restrictions Weight Bearing Restrictions: No General:   Vital Signs:   Pain: Pain Assessment Pain Assessment: Faces Faces Pain Scale: Hurts a little bit Pain Location: Abdomen Pain Orientation: Left Pain Descriptors / Indicators: Cramping Pain Intervention(s): Repositioned;Emotional support Mobility:   Locomotion : Ambulation Ambulation/Gait Assistance: 4: Min Financial controller Distance: 65  Trunk/Postural Assessment :    Balance:   Exercises:   Other Treatments: Treatments Therapeutic Activity: Performed tall kneeling with BUEs supported on small blue bench while reaching to the R for clothes pins and placing them higher to the R while therapist supported at pts R axilla and trunk to increase WB through RUE duirng task.   Pt requires multiple rest  breaks due to increased fatigue.   Neuromuscular Facilitation: Right;Upper Extremity;Lower Extremity;Forced use;Activity to increase grading;Activity to increase sustained activation;Activity to increase lateral weight shifting Weight Bearing Technique Weight Bearing Technique: Yes RUE Weight Bearing Technique: High kneeling Response to Weight Bearing Technique:  tolerated well  See FIM for current functional status  Therapy/Group: Individual Therapy  Denice Bors 09/23/2013, 12:18 PM

## 2013-09-24 ENCOUNTER — Inpatient Hospital Stay (HOSPITAL_COMMUNITY): Payer: Medicare HMO | Admitting: *Deleted

## 2013-09-24 ENCOUNTER — Inpatient Hospital Stay (HOSPITAL_COMMUNITY): Payer: Medicare HMO | Admitting: Rehabilitation

## 2013-09-24 ENCOUNTER — Inpatient Hospital Stay (HOSPITAL_COMMUNITY): Payer: Medicare HMO | Admitting: Physical Therapy

## 2013-09-24 ENCOUNTER — Encounter (HOSPITAL_COMMUNITY): Payer: Medicare HMO | Admitting: Occupational Therapy

## 2013-09-24 ENCOUNTER — Inpatient Hospital Stay (HOSPITAL_COMMUNITY): Payer: Medicare HMO | Admitting: Speech Pathology

## 2013-09-24 DIAGNOSIS — G8110 Spastic hemiplegia affecting unspecified side: Secondary | ICD-10-CM

## 2013-09-24 DIAGNOSIS — I6340 Cerebral infarction due to embolism of unspecified cerebral artery: Secondary | ICD-10-CM

## 2013-09-24 DIAGNOSIS — E1142 Type 2 diabetes mellitus with diabetic polyneuropathy: Secondary | ICD-10-CM

## 2013-09-24 DIAGNOSIS — J449 Chronic obstructive pulmonary disease, unspecified: Secondary | ICD-10-CM

## 2013-09-24 DIAGNOSIS — E1149 Type 2 diabetes mellitus with other diabetic neurological complication: Secondary | ICD-10-CM

## 2013-09-24 LAB — GLUCOSE, CAPILLARY
GLUCOSE-CAPILLARY: 269 mg/dL — AB (ref 70–99)
GLUCOSE-CAPILLARY: 257 mg/dL — AB (ref 70–99)
Glucose-Capillary: 216 mg/dL — ABNORMAL HIGH (ref 70–99)
Glucose-Capillary: 309 mg/dL — ABNORMAL HIGH (ref 70–99)

## 2013-09-24 LAB — TROPONIN I: Troponin I: <0.3 ng/mL (ref <0.30)

## 2013-09-24 MED ORDER — VENLAFAXINE HCL ER 150 MG PO CP24
150.0000 mg | ORAL_CAPSULE | Freq: Every day | ORAL | Status: DC
  Administered 2013-09-25 – 2013-10-08 (×14): 150 mg via ORAL
  Filled 2013-09-24 (×14): qty 1

## 2013-09-24 MED ORDER — INSULIN GLARGINE 100 UNIT/ML ~~LOC~~ SOLN
30.0000 [IU] | Freq: Every day | SUBCUTANEOUS | Status: DC
  Administered 2013-09-24 – 2013-10-07 (×13): 30 [IU] via SUBCUTANEOUS
  Filled 2013-09-24 (×15): qty 0.3

## 2013-09-24 NOTE — Progress Notes (Signed)
Physical Therapy Session Note  Patient Details  Name: Brad Taylor MRN: [id number] Date of Birth: 01/01/1951  Today's Date: 09/24/2013 Time: 4268-3419 Time Calculation (min): 43 min  Short Term Goals: Week 1:  PT Short Term Goal 1 (Week 1): Pt will perform stand pivot transfers with LRAD at min assist level PT Short Term Goal 2 (Week 1): Pt will perform standing balance activities at min assist level PT Short Term Goal 3 (Week 1): Pt will self propel w/c using L hemi technique at supervision level x 100' PT Short Term Goal 4 (Week 1): Pt will ambulate x 100' with LRAD at min assist level.   Skilled Therapeutic Interventions/Progress Updates:   Pt received lying in bed this morning, agreeable to therapy.  Note he continues to be very lethargic and demonstrates delayed processing of verbal commands and note speech somewhat more dysarthric.  Performed supine to sit with use of bed rails at mod assist with tactile cues at pts trunk to facilitate weight shift over to the R hip.  Once sitting, RN provided pt with medication.  Ambulated to/from sink at min/mod assist without use of device with cues for increased step length on the RLE.  Once at sink, had pt wash face in attempts to increase arousal.  Ambulated back to chair with same assist.  Requires max verbal cues for safety and turning all the way around prior to sitting in chair.  Assisted pt to therapy gym in order to perform gait training.  Note pt with increased respirations during session, therefore kept checking pts O2 sats and HR.  Sats remained in 90's throughout with HR up to 108 at times.  Performed 60' gait training with use of quad cane.  Note he continues to have difficulty sequencing and processing during task with continued step by step instructional cues for sequence.  Requires facilitation for increased weight shift to the L in order to increase R step length.  Did not attempt to ambulate back to chair (further than mat) due to  increased respirations and increased LOB to the R.  Again, allowed rest breaks in between then ambulated another 37' x 1 to ADL apt with same cues and assist mentioned above but without AD to decrease amount of processing.  Once in ADL apt, had pt perform sit <> stand from love seat due to lower surface with max verbal and tactile cues for correct feet position prior to standing.  Also cues for controlled descent when sitting.  Ended session with practicing bed mobility in ADL apt to better simulate home environment.  Provided assist for SL to R elbow, however despite cues and assist, pt continues to extend trunk and "sling" LEs into bed.  Performed x 2 reps with continued cues for flexion and rotation.  Assisted pt back to chair and back to room with all needs in reach and half lap tray in place.    Therapy Documentation Precautions:  Precautions Precautions: Fall Precaution Comments: R sided weakness (RUE>RLE), dysarthria Restrictions Weight Bearing Restrictions: No   Vital Signs: Therapy Vitals Pulse Rate: 95 BP: 118/76 mmHg Oxygen Therapy SpO2: 95 % Pain: Pain Assessment Pain Assessment: No/denies pain   Locomotion : Ambulation Ambulation/Gait Assistance: 3: Mod assist Wheelchair Mobility Distance: 10   See FIM for current functional status  Therapy/Group: Individual Therapy  Denice Bors 09/24/2013, 11:59 AM

## 2013-09-24 NOTE — Progress Notes (Signed)
Physical Therapy Session Note  Patient Details  Name: Brad Taylor MRN: [id number] Date of Birth: Dec 24, 1950  Today's Date: 09/24/2013 Time:  -  10:20-10:50 (54min)     Short Term Goals: Week 1:  PT Short Term Goal 1 (Week 1): Pt will perform stand pivot transfers with LRAD at min assist level PT Short Term Goal 2 (Week 1): Pt will perform standing balance activities at min assist level PT Short Term Goal 3 (Week 1): Pt will self propel w/c using L hemi technique at supervision level x 100' PT Short Term Goal 4 (Week 1): Pt will ambulate x 100' with LRAD at min assist level.   Skilled Therapeutic Interventions/Progress Updates:  Pt feeling very lethargic after not sleeping well. Tx focused on NMR via forced use, verbal and tactile cues during functional mobility and transfers. Attempted lateral walking on hand rail at hall, but pt unable to keep head off wall due to fatigue, wanting to stop after 10' with mod A.   Pt needing change diaper, performed in standing at sink x77min with mod A to avoid R LOB. Pt given multipel verbal and tactile cues for R hip/knee ext in standing. Pt heavily relies on LUE for support in standing. Pt challenged to perform self perineal cleaning, but was unable to safely do without LOB.   Sit<>stand x3 throughout with Mod A for safely lowering and manual facilitation for upright posture at pelvis and shoulders.  Pt propelled WC in hall x10' to hand rail.  Pt performed seated NMR and counted aloud to 20 for LAQ and marching RLE with cues for technique.  Pt left up in [facility] with wife present.      Therapy Documentation Precautions:  Precautions Precautions: Fall Precaution Comments: R sided weakness (RUE>RLE), dysarthria Restrictions Weight Bearing Restrictions: No    Pain: Pain Assessment Pain Assessment: No/denies pain  See FIM for current functional status  Therapy/Group: Individual Therapy  Kennieth Rad, PT, DPT  09/24/2013, 10:40 AM

## 2013-09-24 NOTE — Progress Notes (Signed)
Occupational Therapy Session Note  Patient Details  Name: Brad Taylor MRN: [id number] Date of Birth: [birth year]  Today's Date: 09/24/2013 Time: 1103-1200 Time Calculation (min): 57 min  Short Term Goals: Week 1:  OT Short Term Goal 1 (Week 1): Pt will perform all bathing with min assist using AE for sit to stand.   OT Short Term Goal 2 (Week 1): Pt will donn pullover shirt with min assist following hemi technques. OT Short Term Goal 3 (Week 1): Pt will donn pants and shoes with AE and min assist. OT Short Term Goal 4 (Week 1): Pt will perform toilet transfers with supervision. OT Short Term Goal 5 (Week 1): Pt/family will return demonstrate AAROM/PROM exercises for the RUE.  Skilled Therapeutic Interventions/Progress Updates:    Pt performed toilet transfer to the elevated toilet with grab bar on the left side.  Pt needing total assist for toilet hygiene and mod assist for managing brief.  Pt ambulated out to his wheelchair from the bathroom with hand held mod assist.  Performed UB bathing and peri washing only as pt stated nursing assisted him with washing his legs and feet from earlier incontinent episode.  Pt with noted decreased ability to process and sequence tasks this morning compared to previous days. Pt also reporting he feels he is having trouble as well.  He needed max assist with mod instructional cueing for donning pullover shirt following hemi techniques.  Performed grooming task of brushing his teeth while sitting in the wheelchair.  Pt holding toothbrush under the faucet without the water being turned on.  He needed mod questioning cues to recall need for turning the water on.  When finished requested to transfer to bed.  Noted greater fatigue and lethargy this session as well.  Wife in room at end of session and aware of his current status.    Therapy Documentation Precautions:  Precautions Precautions: Fall Precaution Comments: R sided weakness (RUE>RLE),  dysarthria Restrictions Weight Bearing Restrictions: No  Pain: Pain Assessment Pain Assessment: Faces Pain Score: 0-No pain Faces Pain Scale: Hurts a little bit Pain Type: Acute pain Pain Location: Abdomen Pain Orientation: Left Pain Descriptors / Indicators: Cramping Pain Intervention(s): Medication (See eMAR);Repositioned;Emotional support ADL: See FIM for current functional status  Therapy/Group: Individual Therapy  Jahnae Mcadoo OTR/L 09/24/2013, 12:47 PM

## 2013-09-24 NOTE — Progress Notes (Signed)
Subjective/Complaints: 63 y.o. male with history of COPD, diabetes mellitus and peripheral neuropathy, PTSD. Admitted 09/15/2013 with right-sided weakness and slurred speech. MRI of the brain showed acute ischemic infarct involving the posterior limb of the left internal capsule as well as additional subcentimeter ischemic infarct deep white matter of the left centrum semiovale and remote lacunar infarct bilateral basal ganglia. MRA of the head with no stenosis or aneurysm. Echocardiogram is pending. Carotid Dopplers with no ICA stenosis. Patient did not receive TPA. Neurology services consulted placed on aspirin/Plavix for CVA prophylaxis as well as subcutaneous Lovenox for DVT prophylaxis. Maintained on a regular consistency diet  Had CP yesterday with no EKG or enzyme changes, no recurrence Review of Systems - Negative except R weakness  Objective: Vital Signs: Blood pressure 120/67, pulse 86, temperature 97.4 F (36.3 C), temperature source Oral, resp. rate 20, weight 125.4 kg (276 lb 7.3 oz), SpO2 94.00%. No results found. Results for orders placed during the hospital encounter of 09/20/13 (from the past 72 hour(s))  GLUCOSE, CAPILLARY     Status: Abnormal   Collection Time    09/21/13 11:24 AM      Result Value Ref Range   Glucose-Capillary 237 (*) 70 - 99 mg/dL   Comment 1 Notify RN    GLUCOSE, CAPILLARY     Status: Abnormal   Collection Time    09/21/13  4:27 PM      Result Value Ref Range   Glucose-Capillary 259 (*) 70 - 99 mg/dL   Comment 1 Notify RN    GLUCOSE, CAPILLARY     Status: Abnormal   Collection Time    09/21/13  9:14 PM      Result Value Ref Range   Glucose-Capillary 215 (*) 70 - 99 mg/dL  GLUCOSE, CAPILLARY     Status: Abnormal   Collection Time    09/22/13  7:21 AM      Result Value Ref Range   Glucose-Capillary 236 (*) 70 - 99 mg/dL   Comment 1 Notify RN    GLUCOSE, CAPILLARY     Status: Abnormal   Collection Time    09/22/13 12:01 PM      Result Value Ref  Range   Glucose-Capillary 304 (*) 70 - 99 mg/dL  GLUCOSE, CAPILLARY     Status: Abnormal   Collection Time    09/22/13  4:06 PM      Result Value Ref Range   Glucose-Capillary 259 (*) 70 - 99 mg/dL   Comment 1 Notify RN    GLUCOSE, CAPILLARY     Status: Abnormal   Collection Time    09/22/13  9:00 PM      Result Value Ref Range   Glucose-Capillary 234 (*) 70 - 99 mg/dL  GLUCOSE, CAPILLARY     Status: Abnormal   Collection Time    09/23/13  7:32 AM      Result Value Ref Range   Glucose-Capillary 195 (*) 70 - 99 mg/dL   Comment 1 Notify RN    GLUCOSE, CAPILLARY     Status: Abnormal   Collection Time    09/23/13 11:17 AM      Result Value Ref Range   Glucose-Capillary 226 (*) 70 - 99 mg/dL   Comment 1 Notify RN    GLUCOSE, CAPILLARY     Status: Abnormal   Collection Time    09/23/13  2:59 PM      Result Value Ref Range   Glucose-Capillary 181 (*) 70 - 99 mg/dL  TROPONIN I     Status: None   Collection Time    09/23/13  3:12 PM      Result Value Ref Range   Troponin I <0.30  <0.30 ng/mL   Comment:            Due to the release kinetics of cTnI,     a negative result within the first hours     of the onset of symptoms does not rule out     myocardial infarction with certainty.     If myocardial infarction is still suspected,     repeat the test at appropriate intervals.  CK TOTAL AND CKMB     Status: None   Collection Time    09/23/13  3:12 PM      Result Value Ref Range   Total CK 116  7 - 232 U/L   CK, MB 1.4  0.3 - 4.0 ng/mL   Relative Index 1.2  0.0 - 2.5  GLUCOSE, CAPILLARY     Status: Abnormal   Collection Time    09/23/13  4:50 PM      Result Value Ref Range   Glucose-Capillary 221 (*) 70 - 99 mg/dL   Comment 1 Notify RN    GLUCOSE, CAPILLARY     Status: Abnormal   Collection Time    09/23/13  9:12 PM      Result Value Ref Range   Glucose-Capillary 238 (*) 70 - 99 mg/dL  TROPONIN I     Status: None   Collection Time    09/23/13 10:16 PM      Result  Value Ref Range   Troponin I <0.30  <0.30 ng/mL   Comment:            Due to the release kinetics of cTnI,     a negative result within the first hours     of the onset of symptoms does not rule out     myocardial infarction with certainty.     If myocardial infarction is still suspected,     repeat the test at appropriate intervals.  TROPONIN I     Status: None   Collection Time    09/24/13  5:50 AM      Result Value Ref Range   Troponin I <0.30  <0.30 ng/mL   Comment:            Due to the release kinetics of cTnI,     a negative result within the first hours     of the onset of symptoms does not rule out     myocardial infarction with certainty.     If myocardial infarction is still suspected,     repeat the test at appropriate intervals.  GLUCOSE, CAPILLARY     Status: Abnormal   Collection Time    09/24/13  7:26 AM      Result Value Ref Range   Glucose-Capillary 216 (*) 70 - 99 mg/dL   Comment 1 Notify RN         General: No acute distress Mood and affect are appropriate Heart: Regular rate and rhythm no rubs murmurs or extra sounds Lungs: Clear to auscultation, breathing unlabored, no rales or wheezes Abdomen: Positive bowel sounds, soft nontender to palpation, nondistended Extremities: No clubbing, cyanosis, or edema Skin: No evidence of breakdown, no evidence of rash Neurologic: Cranial nerves II through XII intact, motor strength is 5/5 in left deltoid, bicep, tricep, grip, hip flexor, knee extensors,  ankle dorsiflexor and plantar flexor, 3- RUE, 4- R KE, 1/5 R ankle Sensory exam Cerebellar exam normal finger to nose to finger as well as heel to shin in bilateral upper and lower extremities Musculoskeletal: Full range of motion in all 4 extremities. No joint swelling  Assessment/Plan: 1. Functional deficits secondary to embolic left internal capsule/deep white matter infarct   which require 3+ hours per day of interdisciplinary therapy in a comprehensive  inpatient rehab setting. Physiatrist is providing close team supervision and 24 hour management of active medical problems listed below. Physiatrist and rehab team continue to assess barriers to discharge/monitor patient progress toward functional and medical goals. FIM: FIM - Bathing Bathing Steps Patient Completed: Chest;Right Arm;Abdomen;Front perineal area;Right upper leg;Left upper leg Bathing: 3: Mod-Patient completes 5-7 37f 10 parts or 50-74%  FIM - Upper Body Dressing/Undressing Upper body dressing/undressing steps patient completed: Put head through opening of pull over shirt/dress;Thread/unthread left sleeve of pullover shirt/dress;Thread/unthread right sleeve of pullover shirt/dresss Upper body dressing/undressing: 4: Min-Patient completed 75 plus % of tasks FIM - Lower Body Dressing/Undressing Lower body dressing/undressing steps patient completed: Don/Doff left sock;Don/Doff right shoe;Don/Doff right sock;Don/Doff left shoe (Pt used reacher for doffing gripper socks and donning shoes) Lower body dressing/undressing: 3: Mod-Patient completed 50-74% of tasks  FIM - Musician Devices: Grab bar or rail for support Toileting: 1: Total-Patient completed zero steps, helper did all 3  FIM - Radio producer Devices: Oncologist Transfers: 4-To toilet/BSC: Min A (steadying Pt. > 75%)  FIM - Bed/Chair Transfer Bed/Chair Transfer Assistive Devices: Arm rests Bed/Chair Transfer: 2: Supine > Sit: Max A (lifting assist/Pt. 25-49%);4: Bed > Chair or W/C: Min A (steadying Pt. > 75%);4: Chair or W/C > Bed: Min A (steadying Pt. > 75%)  FIM - Locomotion: Wheelchair Distance: 65 Locomotion: Wheelchair: 2: Travels 50 - 149 ft with minimal assistance (Pt.>75%) FIM - Locomotion: Ambulation Locomotion: Ambulation Assistive Devices: Walker - Hemi (and no AD) Ambulation/Gait Assistance: 4: Min assist Locomotion: Ambulation: 2: Travels 50 - 149 ft  with minimal assistance (Pt.>75%)  Comprehension Comprehension Mode: Auditory Comprehension: 5-Follows basic conversation/direction: With extra time/assistive device  Expression Expression Mode: Verbal Expression: 3-Expresses basic 50 - 74% of the time/requires cueing 25 - 50% of the time. Needs to repeat parts of sentences.  Social Interaction Social Interaction: 5-Interacts appropriately 90% of the time - Needs monitoring or encouragement for participation or interaction.  Problem Solving Problem Solving: 5-Solves basic problems: With no assist  Memory Memory: 5-Recognizes or recalls 90% of the time/requires cueing < 10% of the time  Medical Problem List and Plan:  1. Left internal capsule/deep white matter infarcts felt to be embolic secondary to unknown source  2. DVT Prophylaxis/Anticoagulation: Subcutaneous Lovenox. Monitor platelet counts and any signs of bleeding  3. Pain Management: Tylenol as needed  4. Mood/PTSD/depression. Wellbutrin 150 mg daily, BuSpar 5 mg twice a day as needed, Depakote 1000 mg each bedtime, effexor 150 mg twice a day and trazodone 300 mg each bedtime - CP yest likely anxiety related may have excess serotonin, reduce venlafaxine to qam only, consult to neuropsych 5. Neuropsych: This patient is not yet capable of making decisions on his own behalf.  6. Diabetes mellitus with peripheral neuropathy.uncontrolled titrating Lantus  Hemoglobin A1c 7.4. Currently on sliding scale insulin. Patient on Glucotrol XL 10 mg daily. Check blood sugars a.c. and at bedtime resume oral agents as directed  7. Hypertension. Lisinopril 10 mg day. Monitor with increased activity  8. Hypothyroidism. Synthroid .TSH 4.045  9. Hyperlipidemia. Lipitor  10. GERD. Protonix  11. Left lateral lower extremity ulcer. Followup wound care nurse with dressing changes as directed/Unna boot  LOS (Days) 4 A FACE TO FACE EVALUATION WAS PERFORMED  Delmont Prosch E 09/24/2013, 8:15 AM

## 2013-09-24 NOTE — Progress Notes (Signed)
Physical Therapy Note  Patient Details  Name: Brad Taylor MRN: [id number] Date of Birth: August 21, 1950 Today's Date: 09/24/2013  Pt missing 45 minutes of skilled PT secondary to pt fatigue. Pt declined therapy session stating "too tired," and fell asleep soon thereafter. CNA present in room checking vitals. Temperature of 101.9 degrees noted   [name] A 09/24/2013, 3:03 PM

## 2013-09-24 NOTE — Progress Notes (Signed)
Speech Language Pathology Daily Session Note  Patient Details  Name: Brad Taylor MRN: [id number] Date of Birth: 1950-12-08  Today's Date: 09/24/2013 Time: 3491-7915 Time Calculation (min): 30 min  Short Term Goals: Week 1: SLP Short Term Goal 1 (Week 1): Patient will utilize speech intelligibility strategies at the sentnece level with Min vebral cues to self-monitor and correct errors. SLP Short Term Goal 2 (Week 1): Patient will utilize word finding strategies in conversation with Min verbal cues. SLP Short Term Goal 3 (Week 1): Patient will demonstrate problem solving with daily complex problems with Min verbal cues to self-monitor and correct errors.  Skilled Therapeutic Interventions: Skilled treatment session focused on speech-language goals. Patient was initially very sleepy and was sitting in his wheelchair with food in his mouth. After SLP and spouse got patient to be more alert, SLP cued him to use liquid to rinse and clear solid food residuals which remained in oral cavity. Patient intermittently started to fall asleep during session, however he was ablle to maintain attention and alertness during session with auditory and tactile cues. SLP reviewed patient's speech strategies and provided instruction and cues for patient to over=articulate and pause between words, as well as increase vocal intensity at word level (naming task) and 3-4 word phrase level. Patient able to return-demonstrate and started to self-correct towards end of session. Problem solving and higher level cognitive tasks were not attempted secondary to patient too fatigued/tired to focus on this type of task. Continue with plan of care.    FIM:  Expression Expression Mode: Verbal Expression: 3-Expresses basic 50 - 74% of the time/requires cueing 25 - 50% of the time. Needs to repeat parts of sentences. Social Interaction Social Interaction: 5-Interacts appropriately 90% of the time - Needs monitoring or  encouragement for participation or interaction.  Pain Pain Assessment Pain Assessment: No/denies pain Pain Score: 0-No pain  Therapy/Group: Individual Therapy  Brad Taylor 09/24/2013, 12:26 PM  Brad Baller, MA, CCC-SLP [facility] Speech-Language Pathologist

## 2013-09-25 ENCOUNTER — Inpatient Hospital Stay (HOSPITAL_COMMUNITY): Payer: Medicare HMO

## 2013-09-25 DIAGNOSIS — J449 Chronic obstructive pulmonary disease, unspecified: Secondary | ICD-10-CM

## 2013-09-25 DIAGNOSIS — I6340 Cerebral infarction due to embolism of unspecified cerebral artery: Secondary | ICD-10-CM

## 2013-09-25 DIAGNOSIS — G8110 Spastic hemiplegia affecting unspecified side: Secondary | ICD-10-CM

## 2013-09-25 DIAGNOSIS — E1142 Type 2 diabetes mellitus with diabetic polyneuropathy: Secondary | ICD-10-CM

## 2013-09-25 DIAGNOSIS — E1149 Type 2 diabetes mellitus with other diabetic neurological complication: Secondary | ICD-10-CM

## 2013-09-25 LAB — GLUCOSE, CAPILLARY
Glucose-Capillary: 235 mg/dL — ABNORMAL HIGH (ref 70–99)
Glucose-Capillary: 276 mg/dL — ABNORMAL HIGH (ref 70–99)
Glucose-Capillary: 257 mg/dL — ABNORMAL HIGH (ref 70–99)
Glucose-Capillary: 238 mg/dL — ABNORMAL HIGH (ref 70–99)

## 2013-09-25 LAB — COMPREHENSIVE METABOLIC PANEL
ALT: 61 U/L — ABNORMAL HIGH (ref 0–53)
AST: 33 U/L (ref 0–37)
Albumin: 3.2 g/dL — ABNORMAL LOW (ref 3.5–5.2)
Alkaline Phosphatase: 120 U/L — ABNORMAL HIGH (ref 39–117)
BUN: 13 mg/dL (ref 6–23)
CO2: 26 mEq/L (ref 19–32)
Calcium: 9.8 mg/dL (ref 8.4–10.5)
Chloride: 90 mEq/L — ABNORMAL LOW (ref 96–112)
Creatinine, Ser: 0.7 mg/dL (ref 0.50–1.35)
GFR calc non Af Amer: >90 mL/min (ref >90)
GLUCOSE: 243 mg/dL — AB (ref 70–99)
Potassium: 3.8 mEq/L (ref 3.7–5.3)
Sodium: 131 mEq/L — ABNORMAL LOW (ref 137–147)
TOTAL PROTEIN: 7.4 g/dL (ref 6.0–8.3)
Total Bilirubin: 0.6 mg/dL (ref 0.3–1.2)

## 2013-09-25 LAB — URINALYSIS, ROUTINE W REFLEX MICROSCOPIC
Glucose, UA: 500 mg/dL — AB
Ketones, ur: >80 mg/dL — AB
NITRITE: POSITIVE — AB
PROTEIN: 30 mg/dL — AB
Specific Gravity, Urine: 1.034 — ABNORMAL HIGH (ref 1.005–1.030)
Urobilinogen, UA: 1 mg/dL (ref 0.0–1.0)
pH: 5.5 (ref 5.0–8.0)

## 2013-09-25 LAB — CBC
HCT: 43.2 % (ref 39.0–52.0)
HEMOGLOBIN: 15.6 g/dL (ref 13.0–17.0)
MCH: 32.2 pg (ref 26.0–34.0)
MCHC: 36.1 g/dL — ABNORMAL HIGH (ref 30.0–36.0)
MCV: 89.1 fL (ref 78.0–100.0)
Platelets: 154 10*3/uL (ref 150–400)
RBC: 4.85 MIL/uL (ref 4.22–5.81)
RDW: 13 % (ref 11.5–15.5)
WBC: 16.4 10*3/uL — ABNORMAL HIGH (ref 4.0–10.5)

## 2013-09-25 LAB — URINE MICROSCOPIC-ADD ON

## 2013-09-25 LAB — VALPROIC ACID LEVEL: VALPROIC ACID LVL: 55.9 ug/mL (ref 50.0–100.0)

## 2013-09-25 LAB — TSH: TSH: 4.158 u[IU]/mL (ref 0.350–4.500)

## 2013-09-25 MED ORDER — RABEPRAZOLE SODIUM 20 MG PO TBEC
40.0000 mg | DELAYED_RELEASE_TABLET | Freq: Every day | ORAL | Status: DC
  Administered 2013-09-26 – 2013-10-08 (×13): 40 mg via ORAL
  Filled 2013-09-25 (×2): qty 2

## 2013-09-25 MED ORDER — INSULIN GLARGINE 100 UNIT/ML ~~LOC~~ SOLN
10.0000 [IU] | Freq: Every day | SUBCUTANEOUS | Status: DC
  Administered 2013-09-25 – 2013-09-27 (×3): 10 [IU] via SUBCUTANEOUS
  Filled 2013-09-25 (×3): qty 0.1

## 2013-09-25 MED ORDER — NON FORMULARY: 40.0000 mg | Freq: Every day | Status: DC

## 2013-09-25 MED ORDER — LOPERAMIDE HCL 2 MG PO CAPS
2.0000 mg | ORAL_CAPSULE | ORAL | Status: DC | PRN
  Administered 2013-09-25: 2 mg via ORAL
  Filled 2013-09-25: qty 1

## 2013-09-25 MED ORDER — SODIUM CHLORIDE 0.45 % IV SOLN
INTRAVENOUS | Status: DC
  Administered 2013-09-25: 10:00:00 via INTRAVENOUS

## 2013-09-25 MED ORDER — LOPERAMIDE HCL 2 MG PO CAPS: 2.0000 mg | ORAL_CAPSULE | ORAL | Status: DC | PRN

## 2013-09-25 MED ORDER — PIPERACILLIN-TAZOBACTAM 3.375 G IVPB
3.3750 g | Freq: Four times a day (QID) | INTRAVENOUS | Status: DC
  Administered 2013-09-25 – 2013-09-27 (×8): 3.375 g via INTRAVENOUS
  Filled 2013-09-25 (×10): qty 50

## 2013-09-25 NOTE — Progress Notes (Signed)
09/24/17 PM dose of Trazodone 300mg  held due to patient with increased drowsiness.  Patient slept throughout night and without complaint.  Wife remained at bedside.    Fredna Dow M

## 2013-09-25 NOTE — Progress Notes (Signed)
Subjective/Complaints: 63 y.o. male with history of COPD, diabetes mellitus and peripheral neuropathy, PTSD. Admitted 09/15/2013 with right-sided weakness and slurred speech. MRI of the brain showed acute ischemic infarct involving the posterior limb of the left internal capsule as well as additional subcentimeter ischemic infarct deep white matter of the left centrum semiovale and remote lacunar infarct bilateral basal ganglia. MRA of the head with no stenosis or aneurysm. Echocardiogram is pending. Carotid Dopplers with no ICA stenosis. Patient did not receive TPA. Neurology services consulted placed on aspirin/Plavix for CVA prophylaxis as well as subcutaneous Lovenox for DVT prophylaxis. Maintained on a regular consistency diet  Pt with continued lethargy per wife. She states he's like this when his sugars are too high. Review of Systems - chest pain?  Objective: Vital Signs: Blood pressure 112/75, pulse 90, temperature 98 F (36.7 C), temperature source Axillary, resp. rate 18, weight 125.4 kg (276 lb 7.3 oz), SpO2 93.00%. No results found. Results for orders placed during the hospital encounter of 09/20/13 (from the past 72 hour(s))  GLUCOSE, CAPILLARY     Status: Abnormal   Collection Time    09/22/13 12:01 PM      Result Value Ref Range   Glucose-Capillary 304 (*) 70 - 99 mg/dL  GLUCOSE, CAPILLARY     Status: Abnormal   Collection Time    09/22/13  4:06 PM      Result Value Ref Range   Glucose-Capillary 259 (*) 70 - 99 mg/dL   Comment 1 Notify RN    GLUCOSE, CAPILLARY     Status: Abnormal   Collection Time    09/22/13  9:00 PM      Result Value Ref Range   Glucose-Capillary 234 (*) 70 - 99 mg/dL  GLUCOSE, CAPILLARY     Status: Abnormal   Collection Time    09/23/13  7:32 AM      Result Value Ref Range   Glucose-Capillary 195 (*) 70 - 99 mg/dL   Comment 1 Notify RN    GLUCOSE, CAPILLARY     Status: Abnormal   Collection Time    09/23/13 11:17 AM      Result Value Ref Range    Glucose-Capillary 226 (*) 70 - 99 mg/dL   Comment 1 Notify RN    GLUCOSE, CAPILLARY     Status: Abnormal   Collection Time    09/23/13  2:59 PM      Result Value Ref Range   Glucose-Capillary 181 (*) 70 - 99 mg/dL  TROPONIN I     Status: None   Collection Time    09/23/13  3:12 PM      Result Value Ref Range   Troponin I <0.30  <0.30 ng/mL   Comment:            Due to the release kinetics of cTnI,     a negative result within the first hours     of the onset of symptoms does not rule out     myocardial infarction with certainty.     If myocardial infarction is still suspected,     repeat the test at appropriate intervals.  CK TOTAL AND CKMB     Status: None   Collection Time    09/23/13  3:12 PM      Result Value Ref Range   Total CK 116  7 - 232 U/L   CK, MB 1.4  0.3 - 4.0 ng/mL   Relative Index 1.2  0.0 - 2.5  GLUCOSE, CAPILLARY     Status: Abnormal   Collection Time    09/23/13  4:50 PM      Result Value Ref Range   Glucose-Capillary 221 (*) 70 - 99 mg/dL   Comment 1 Notify RN    GLUCOSE, CAPILLARY     Status: Abnormal   Collection Time    09/23/13  9:12 PM      Result Value Ref Range   Glucose-Capillary 238 (*) 70 - 99 mg/dL  TROPONIN I     Status: None   Collection Time    09/23/13 10:16 PM      Result Value Ref Range   Troponin I <0.30  <0.30 ng/mL   Comment:            Due to the release kinetics of cTnI,     a negative result within the first hours     of the onset of symptoms does not rule out     myocardial infarction with certainty.     If myocardial infarction is still suspected,     repeat the test at appropriate intervals.  TROPONIN I     Status: None   Collection Time    09/24/13  5:50 AM      Result Value Ref Range   Troponin I <0.30  <0.30 ng/mL   Comment:            Due to the release kinetics of cTnI,     a negative result within the first hours     of the onset of symptoms does not rule out     myocardial infarction with certainty.      If myocardial infarction is still suspected,     repeat the test at appropriate intervals.  GLUCOSE, CAPILLARY     Status: Abnormal   Collection Time    09/24/13  7:26 AM      Result Value Ref Range   Glucose-Capillary 216 (*) 70 - 99 mg/dL   Comment 1 Notify RN    GLUCOSE, CAPILLARY     Status: Abnormal   Collection Time    09/24/13 11:33 AM      Result Value Ref Range   Glucose-Capillary 309 (*) 70 - 99 mg/dL   Comment 1 Notify RN    GLUCOSE, CAPILLARY     Status: Abnormal   Collection Time    09/24/13  4:27 PM      Result Value Ref Range   Glucose-Capillary 269 (*) 70 - 99 mg/dL   Comment 1 Notify RN    GLUCOSE, CAPILLARY     Status: Abnormal   Collection Time    09/24/13  8:45 PM      Result Value Ref Range   Glucose-Capillary 257 (*) 70 - 99 mg/dL   Comment 1 Notify RN    GLUCOSE, CAPILLARY     Status: Abnormal   Collection Time    09/25/13  7:10 AM      Result Value Ref Range   Glucose-Capillary 235 (*) 70 - 99 mg/dL   Comment 1 Notify RN         General: No acute distress Mood and affect are appropriate Heart: Regular rate and rhythm no rubs murmurs or extra sounds Lungs: Clear to auscultation, breathing unlabored, no rales or wheezes, poor inspiratory effort Abdomen: Positive bowel sounds, soft nontender to palpation, nondistended Extremities: No clubbing, cyanosis, or edema Skin: No evidence of breakdown, no evidence of rash Neurologic: pt is lethargic, difficult  to arouse. Cranial nerves II through XII intact, motor strength is 5/5 in left deltoid, bicep, tricep, grip, hip flexor, knee extensors, ankle dorsiflexor and plantar flexor, 3- RUE, 4- R KE, 1/5 R ankle Sensory exam Cerebellar exam normal finger to nose to finger as well as heel to shin in bilateral upper and lower extremities Musculoskeletal: Full range of motion in all 4 extremities. No joint swelling  Assessment/Plan: 1. Functional deficits secondary to embolic left internal capsule/deep white  matter infarct   which require 3+ hours per day of interdisciplinary therapy in a comprehensive inpatient rehab setting. Physiatrist is providing close team supervision and 24 hour management of active medical problems listed below. Physiatrist and rehab team continue to assess barriers to discharge/monitor patient progress toward functional and medical goals. FIM: FIM - Bathing Bathing Steps Patient Completed: Chest;Right Arm;Abdomen Bathing: 3: Mod-Patient completes 5-7 38f 10 parts or 50-74%  FIM - Upper Body Dressing/Undressing Upper body dressing/undressing steps patient completed: Thread/unthread left sleeve of pullover shirt/dress Upper body dressing/undressing: 2: Max-Patient completed 25-49% of tasks FIM - Lower Body Dressing/Undressing Lower body dressing/undressing steps patient completed: Don/Doff left sock;Don/Doff right shoe;Don/Doff right sock;Don/Doff left shoe Lower body dressing/undressing: 3: Mod-Patient completed 50-74% of tasks  FIM - Toileting Toileting steps completed by patient: Adjust clothing prior to toileting Toileting Assistive Devices: Grab bar or rail for support Toileting: 2: Max-Patient completed 1 of 3 steps  FIM - Radio producer Devices: Elevated toilet seat;Grab bars Toilet Transfers: 4-To toilet/BSC: Min A (steadying Pt. > 75%);4-From toilet/BSC: Min A (steadying Pt. > 75%)  FIM - Bed/Chair Transfer Bed/Chair Transfer Assistive Devices: Arm rests Bed/Chair Transfer: 4: Chair or W/C > Bed: Min A (steadying Pt. > 75%)  FIM - Locomotion: Wheelchair Distance: 10 Locomotion: Wheelchair: 0: Activity did not occur FIM - Locomotion: Ambulation Locomotion: Ambulation Assistive Devices: Nurse, adult (and no AD) Ambulation/Gait Assistance: 3: Mod assist Locomotion: Ambulation: 2: Travels 50 - 149 ft with moderate assistance (Pt: 50 - 74%)  Comprehension Comprehension Mode: Auditory Comprehension: 5-Follows basic  conversation/direction: With extra time/assistive device  Expression Expression Mode: Verbal Expression: 3-Expresses basic 50 - 74% of the time/requires cueing 25 - 50% of the time. Needs to repeat parts of sentences.  Social Interaction Social Interaction: 5-Interacts appropriately 90% of the time - Needs monitoring or encouragement for participation or interaction.  Problem Solving Problem Solving: 5-Solves basic problems: With no assist  Memory Memory: 5-Recognizes or recalls 90% of the time/requires cueing < 10% of the time  Medical Problem List and Plan:  1. Left internal capsule/deep white matter infarcts felt to be embolic secondary to unknown source  2. DVT Prophylaxis/Anticoagulation: Subcutaneous Lovenox. Monitor platelet counts and any signs of bleeding  3. Pain Management: Tylenol as needed  4. Mood/PTSD/depression. Wellbutrin 150 mg daily, BuSpar 5 mg twice a day as needed, Depakote 1000 mg each bedtime, effexor 150 mg twice a day and trazodone 300 mg each bedtime - CP yest likely anxiety related may have excess serotonin, reduce venlafaxine to qam only, consult to neuropsych 5. Neuropsych: This patient is not yet capable of making decisions on his own behalf.  6. Diabetes mellitus with peripheral neuropathy.uncontrolled titrating Lantus --add am dose today  Hemoglobin A1c 7.4. Currently on sliding scale insulin.   Check blood sugars a.c. and at bedtime resume oral agents as directed   -treat presumptive infection 7. Hypertension. Lisinopril 10 mg day. Monitor with increased activity  8. Hypothyroidism. Synthroid .TSH 4.045  9. Hyperlipidemia.  Lipitor  10. GERD. Protonix  11. Left lateral lower extremity ulcer. Followup wound care nurse with dressing changes as directed/Unna boot 12. Lethargy: has low grade temp  -check cxr, urine culture. Continue wound care  -pending results, begin empiric abx  -check cbc, cmet, vpa level  -adjust lantus insulin  LOS (Days) 5 A FACE  TO FACE EVALUATION WAS PERFORMED  Amil Moseman T 09/25/2013, 7:44 AM

## 2013-09-26 ENCOUNTER — Inpatient Hospital Stay (HOSPITAL_COMMUNITY): Payer: Medicare HMO | Admitting: Physical Therapy

## 2013-09-26 ENCOUNTER — Inpatient Hospital Stay (HOSPITAL_COMMUNITY): Payer: Medicare HMO | Admitting: Occupational Therapy

## 2013-09-26 LAB — BASIC METABOLIC PANEL
BUN: 18 mg/dL (ref 6–23)
CALCIUM: 9.2 mg/dL (ref 8.4–10.5)
CO2: 27 mEq/L (ref 19–32)
CREATININE: 0.82 mg/dL (ref 0.50–1.35)
Chloride: 89 mEq/L — ABNORMAL LOW (ref 96–112)
GFR calc Af Amer: >90 mL/min (ref >90)
GFR calc non Af Amer: >90 mL/min (ref >90)
GLUCOSE: 239 mg/dL — AB (ref 70–99)
Potassium: 3.5 mEq/L — ABNORMAL LOW (ref 3.7–5.3)
SODIUM: 128 meq/L — AB (ref 137–147)

## 2013-09-26 LAB — GLUCOSE, CAPILLARY
Glucose-Capillary: 207 mg/dL — ABNORMAL HIGH (ref 70–99)
Glucose-Capillary: 228 mg/dL — ABNORMAL HIGH (ref 70–99)
Glucose-Capillary: 223 mg/dL — ABNORMAL HIGH (ref 70–99)
Glucose-Capillary: 214 mg/dL — ABNORMAL HIGH (ref 70–99)

## 2013-09-26 LAB — CBC
HEMATOCRIT: 39.5 % (ref 39.0–52.0)
HEMOGLOBIN: 13.9 g/dL (ref 13.0–17.0)
MCH: 31.3 pg (ref 26.0–34.0)
MCHC: 35.2 g/dL (ref 30.0–36.0)
MCV: 89 fL (ref 78.0–100.0)
Platelets: 155 10*3/uL (ref 150–400)
RBC: 4.44 MIL/uL (ref 4.22–5.81)
RDW: 13.1 % (ref 11.5–15.5)
WBC: 7.6 10*3/uL (ref 4.0–10.5)

## 2013-09-26 MED ORDER — POTASSIUM CHLORIDE IN NACL 20-0.9 MEQ/L-% IV SOLN
INTRAVENOUS | Status: DC
  Administered 2013-09-26 – 2013-09-28 (×3): via INTRAVENOUS
  Filled 2013-09-26 (×11): qty 1000

## 2013-09-26 NOTE — Progress Notes (Signed)
Occupational Therapy Session Note  Patient Details  Name: Brad Taylor MRN: [id number] Date of Birth: [birth year]  Today's Date: 09/26/2013 Time: 1000-1055 and 1500-1520 Time Calculation (min): 55 min and 20 min  Short Term Goals: Week 1:  OT Short Term Goal 1 (Week 1): Pt will perform all bathing with min assist using AE for sit to stand.   OT Short Term Goal 2 (Week 1): Pt will donn pullover shirt with min assist following hemi technques. OT Short Term Goal 3 (Week 1): Pt will donn pants and shoes with AE and min assist. OT Short Term Goal 4 (Week 1): Pt will perform toilet transfers with supervision. OT Short Term Goal 5 (Week 1): Pt/family will return demonstrate AAROM/PROM exercises for the RUE.  Skilled Therapeutic Interventions/Progress Updates:    1) Engaged in ADL retraining with focus on sequencing, motor planning, and processing needed for self-care tasks of bathing and dressing.  Pt appeared fatigued this session and required increased time to respond to instructional cues for bathing and hemi-technique with donning shirt.  Verbal cues provided for initiation with each step of UB bathing and dressing.  Engaged in [place] PROM in sitting with focus on elbow flexion/extension, supination/pronation, and wrist flexion/extension.  Pt instructed to complete finger flexion/extension.   2) Pt received supine on therapy mat in gym with PT reporting pt c/o nausea.  Pt awakened and reporting still not feeling well.  Supine to side to sitting with mod assist, upon sitting edge of mat pt with decreased sitting balance and reports not feeling well.  BP taken 120/75 and HR 87.  RN notified of nausea and provided meds.  Pt requested to return to bed.  Performed stand step transfer mat > w/c > bed.  Mod verbal cues for sequencing of transfer back to bed.  Pt missed 10 mins due to nausea.  Therapy Documentation Precautions:  Precautions Precautions: Fall Precaution Comments: R sided weakness  (RUE>RLE), dysarthria Restrictions Weight Bearing Restrictions: No Pain:  Pt with no c/o pain  See FIM for current functional status  Therapy/Group: Individual Therapy  Simonne Come 09/26/2013, 12:04 PM

## 2013-09-26 NOTE — Progress Notes (Signed)
Physical Therapy Note  Patient Details  Name: Brad Taylor MRN: [id number] Date of Birth: [birth year] Today's Date: 09/26/2013  [id number] (55 minutes) individual Pain: no reported pain Focus of treatment: bed mobility training; transfer training; Neuro re-ed Rt LE; therapeutic exercise focused on activity tolerance Treatment: Pt in bed upon arrival; pt required assist to donn pants in supine with pt rolling side to side with min assist; Neuro re-ed rt LE- AA hip flexion/extension/ abduction in spine; supine to side to sit min assist with flat bed; transfer stand /turn min /mod assist ; Nustep level 2 LEs only; gait 20 feet x 2 mod assist for balance + mod vcs for complete step on right holding IV pole for assist; returned to room with all needs within reach; wife present.  1415-1450 (35 minutes) individual (missed 10 minutes/nausea) Pain: no reported pain Focus of treatment: gait training; therapeutic activities focused on RT knee/hip extension in stance Treatment: Pt in bed upon arrival ; supine to sit SBA with bedrail/flat bed; transfer stand /turn with unilateral UE support of bedrail min assist; gait 33 feet LBQC min/mod assist with moderate vcs for sequencing AD and stepping on right ; standing performing reaching activity to right with mod tactile cues at shoulder for trunk extension and blocking right knee in extension.     Ruthetta Koopmann,JIM 09/26/2013, 9:26 AM

## 2013-09-26 NOTE — Progress Notes (Signed)
Patient medicated with Zofran as ordered for complaint of nausea.  No vomiting present.  Skin warm and dry.  Wife remains at bedside.  Zosyn IV infusing as ordered.    Fredna Dow M

## 2013-09-26 NOTE — Progress Notes (Addendum)
Subjective/Complaints: 63 y.o. male with history of COPD, diabetes mellitus and peripheral neuropathy, PTSD. Admitted 09/15/2013 with right-sided weakness and slurred speech. MRI of the brain showed acute ischemic infarct involving the posterior limb of the left internal capsule as well as additional subcentimeter ischemic infarct deep white matter of the left centrum semiovale and remote lacunar infarct bilateral basal ganglia. MRA of the head with no stenosis or aneurysm. Echocardiogram is pending. Carotid Dopplers with no ICA stenosis. Patient did not receive TPA. Neurology services consulted placed on aspirin/Plavix for CVA prophylaxis as well as subcutaneous Lovenox for DVT prophylaxis. Maintained on a regular consistency diet  A little restless last night. Wife reports improved alertness however in the evening and this am Review of Systems -   Objective: Vital Signs: Blood pressure 123/85, pulse 103, temperature 98.9 F (37.2 C), temperature source Axillary, resp. rate 20, weight 125.4 kg (276 lb 7.3 oz), SpO2 92.00%. Dg Chest 2 View  09/25/2013   CLINICAL DATA:  Followup partial small bowel obstruction  EXAM: CHEST  2 VIEW  COMPARISON:  09/15/2013  FINDINGS: Enlargement of cardiac silhouette.  Atherosclerotic calcification aorta.  Mediastinal contours and pulmonary vascularity normal.  Mild right basilar atelectasis.  Lungs otherwise clear.  No pleural effusion or pneumothorax.  IMPRESSION: Enlargement of cardiac silhouette.  Right basilar atelectasis.   Electronically Signed   By: Lavonia Dana M.D.   On: 09/25/2013 15:36   Results for orders placed during the hospital encounter of 09/20/13 (from the past 72 hour(s))  GLUCOSE, CAPILLARY     Status: Abnormal   Collection Time    09/23/13 11:17 AM      Result Value Ref Range   Glucose-Capillary 226 (*) 70 - 99 mg/dL   Comment 1 Notify RN    GLUCOSE, CAPILLARY     Status: Abnormal   Collection Time    09/23/13  2:59 PM      Result Value Ref  Range   Glucose-Capillary 181 (*) 70 - 99 mg/dL  TROPONIN I     Status: None   Collection Time    09/23/13  3:12 PM      Result Value Ref Range   Troponin I <0.30  <0.30 ng/mL   Comment:            Due to the release kinetics of cTnI,     a negative result within the first hours     of the onset of symptoms does not rule out     myocardial infarction with certainty.     If myocardial infarction is still suspected,     repeat the test at appropriate intervals.  CK TOTAL AND CKMB     Status: None   Collection Time    09/23/13  3:12 PM      Result Value Ref Range   Total CK 116  7 - 232 U/L   CK, MB 1.4  0.3 - 4.0 ng/mL   Relative Index 1.2  0.0 - 2.5  GLUCOSE, CAPILLARY     Status: Abnormal   Collection Time    09/23/13  4:50 PM      Result Value Ref Range   Glucose-Capillary 221 (*) 70 - 99 mg/dL   Comment 1 Notify RN    GLUCOSE, CAPILLARY     Status: Abnormal   Collection Time    09/23/13  9:12 PM      Result Value Ref Range   Glucose-Capillary 238 (*) 70 - 99 mg/dL  TROPONIN I  Status: None   Collection Time    09/23/13 10:16 PM      Result Value Ref Range   Troponin I <0.30  <0.30 ng/mL   Comment:            Due to the release kinetics of cTnI,     a negative result within the first hours     of the onset of symptoms does not rule out     myocardial infarction with certainty.     If myocardial infarction is still suspected,     repeat the test at appropriate intervals.  TROPONIN I     Status: None   Collection Time    09/24/13  5:50 AM      Result Value Ref Range   Troponin I <0.30  <0.30 ng/mL   Comment:            Due to the release kinetics of cTnI,     a negative result within the first hours     of the onset of symptoms does not rule out     myocardial infarction with certainty.     If myocardial infarction is still suspected,     repeat the test at appropriate intervals.  GLUCOSE, CAPILLARY     Status: Abnormal   Collection Time    09/24/13  7:26  AM      Result Value Ref Range   Glucose-Capillary 216 (*) 70 - 99 mg/dL   Comment 1 Notify RN    GLUCOSE, CAPILLARY     Status: Abnormal   Collection Time    09/24/13 11:33 AM      Result Value Ref Range   Glucose-Capillary 309 (*) 70 - 99 mg/dL   Comment 1 Notify RN    GLUCOSE, CAPILLARY     Status: Abnormal   Collection Time    09/24/13  4:27 PM      Result Value Ref Range   Glucose-Capillary 269 (*) 70 - 99 mg/dL   Comment 1 Notify RN    GLUCOSE, CAPILLARY     Status: Abnormal   Collection Time    09/24/13  8:45 PM      Result Value Ref Range   Glucose-Capillary 257 (*) 70 - 99 mg/dL   Comment 1 Notify RN    GLUCOSE, CAPILLARY     Status: Abnormal   Collection Time    09/25/13  7:10 AM      Result Value Ref Range   Glucose-Capillary 235 (*) 70 - 99 mg/dL   Comment 1 Notify RN    COMPREHENSIVE METABOLIC PANEL     Status: Abnormal   Collection Time    09/25/13  9:25 AM      Result Value Ref Range   Sodium 131 (*) 137 - 147 mEq/L   Potassium 3.8  3.7 - 5.3 mEq/L   Chloride 90 (*) 96 - 112 mEq/L   CO2 26  19 - 32 mEq/L   Glucose, Bld 243 (*) 70 - 99 mg/dL   BUN 13  6 - 23 mg/dL   Creatinine, Ser 0.70  0.50 - 1.35 mg/dL   Calcium 9.8  8.4 - 10.5 mg/dL   Total Protein 7.4  6.0 - 8.3 g/dL   Albumin 3.2 (*) 3.5 - 5.2 g/dL   AST 33  0 - 37 U/L   ALT 61 (*) 0 - 53 U/L   Alkaline Phosphatase 120 (*) 39 - 117 U/L   Total Bilirubin 0.6  0.3 -  1.2 mg/dL   GFR calc non Af Amer >90  >90 mL/min   GFR calc Af Amer >90  >90 mL/min   Comment: (NOTE)     The eGFR has been calculated using the CKD EPI equation.     This calculation has not been validated in all clinical situations.     eGFR's persistently <90 mL/min signify possible Chronic Kidney     Disease.  VALPROIC ACID LEVEL     Status: None   Collection Time    09/25/13  9:25 AM      Result Value Ref Range   Valproic Acid Lvl 55.9  50.0 - 100.0 ug/mL  CBC     Status: Abnormal   Collection Time    09/25/13  9:25 AM       Result Value Ref Range   WBC 16.4 (*) 4.0 - 10.5 K/uL   RBC 4.85  4.22 - 5.81 MIL/uL   Hemoglobin 15.6  13.0 - 17.0 g/dL   HCT 43.2  39.0 - 52.0 %   MCV 89.1  78.0 - 100.0 fL   MCH 32.2  26.0 - 34.0 pg   MCHC 36.1 (*) 30.0 - 36.0 g/dL   RDW 13.0  11.5 - 15.5 %   Platelets 154  150 - 400 K/uL  TSH     Status: None   Collection Time    09/25/13  9:25 AM      Result Value Ref Range   TSH 4.158  0.350 - 4.500 uIU/mL   Comment: Performed at [facility], ROUTINE W REFLEX MICROSCOPIC     Status: Abnormal   Collection Time    09/25/13 10:09 AM      Result Value Ref Range   Color, Urine AMBER (*) YELLOW   Comment: BIOCHEMICALS MAY BE AFFECTED BY COLOR   APPearance CLOUDY (*) CLEAR   Specific Gravity, Urine 1.034 (*) 1.005 - 1.030   pH 5.5  5.0 - 8.0   Glucose, UA 500 (*) NEGATIVE mg/dL   Hgb urine dipstick MODERATE (*) NEGATIVE   Bilirubin Urine MODERATE (*) NEGATIVE   Ketones, ur >80 (*) NEGATIVE mg/dL   Protein, ur 30 (*) NEGATIVE mg/dL   Urobilinogen, UA 1.0  0.0 - 1.0 mg/dL   Nitrite POSITIVE (*) NEGATIVE   Leukocytes, UA SMALL (*) NEGATIVE  URINE MICROSCOPIC-ADD ON     Status: Abnormal   Collection Time    09/25/13 10:09 AM      Result Value Ref Range   Squamous Epithelial / LPF FEW (*) RARE   WBC, UA 11-20  <3 WBC/hpf   RBC / HPF 3-6  <3 RBC/hpf   Bacteria, UA FEW (*) RARE   Urine-Other MUCOUS PRESENT    GLUCOSE, CAPILLARY     Status: Abnormal   Collection Time    09/25/13 11:41 AM      Result Value Ref Range   Glucose-Capillary 276 (*) 70 - 99 mg/dL   Comment 1 Notify RN    GLUCOSE, CAPILLARY     Status: Abnormal   Collection Time    09/25/13  4:36 PM      Result Value Ref Range   Glucose-Capillary 257 (*) 70 - 99 mg/dL   Comment 1 Notify RN    GLUCOSE, CAPILLARY     Status: Abnormal   Collection Time    09/25/13  9:07 PM      Result Value Ref Range   Glucose-Capillary 238 (*) 70 - 99 mg/dL  Comment 1 Notify RN    CBC     Status: None    Collection Time    09/26/13  5:00 AM      Result Value Ref Range   WBC 7.6  4.0 - 10.5 K/uL   RBC 4.44  4.22 - 5.81 MIL/uL   Hemoglobin 13.9  13.0 - 17.0 g/dL   HCT 39.5  39.0 - 52.0 %   MCV 89.0  78.0 - 100.0 fL   MCH 31.3  26.0 - 34.0 pg   MCHC 35.2  30.0 - 36.0 g/dL   RDW 13.1  11.5 - 15.5 %   Platelets 155  150 - 400 K/uL  BASIC METABOLIC PANEL     Status: Abnormal   Collection Time    09/26/13  5:00 AM      Result Value Ref Range   Sodium 128 (*) 137 - 147 mEq/L   Potassium 3.5 (*) 3.7 - 5.3 mEq/L   Chloride 89 (*) 96 - 112 mEq/L   CO2 27  19 - 32 mEq/L   Glucose, Bld 239 (*) 70 - 99 mg/dL   BUN 18  6 - 23 mg/dL   Creatinine, Ser 0.82  0.50 - 1.35 mg/dL   Calcium 9.2  8.4 - 10.5 mg/dL   GFR calc non Af Amer >90  >90 mL/min   GFR calc Af Amer >90  >90 mL/min   Comment: (NOTE)     The eGFR has been calculated using the CKD EPI equation.     This calculation has not been validated in all clinical situations.     eGFR's persistently <90 mL/min signify possible Chronic Kidney     Disease.  GLUCOSE, CAPILLARY     Status: Abnormal   Collection Time    09/26/13  7:08 AM      Result Value Ref Range   Glucose-Capillary 207 (*) 70 - 99 mg/dL   Comment 1 Notify RN         General: No acute distress Mood and affect are appropriate Heart: Regular rate and rhythm no rubs murmurs or extra sounds Lungs: Clear to auscultation, breathing unlabored, no rales or wheezes, poor inspiratory effort Abdomen: Positive bowel sounds, soft nontender to palpation, nondistended Extremities: No clubbing, cyanosis, or edema Skin: No evidence of breakdown, no evidence of rash Neurologic: pt is more arousable. Could tell me he was at [facility]. Voice clearer. Cranial nerves II through XII intact, motor strength is 5/5 in left deltoid, bicep, tricep, grip, hip flexor, knee extensors, ankle dorsiflexor and plantar flexor, 3- RUE, 4- R KE, 1/5 R ankle Sensory exam Cerebellar exam normal finger to nose  to finger as well as heel to shin in bilateral upper and lower extremities Musculoskeletal: Full range of motion in all 4 extremities. No joint swelling  Assessment/Plan: 1. Functional deficits secondary to embolic left internal capsule/deep white matter infarct   which require 3+ hours per day of interdisciplinary therapy in a comprehensive inpatient rehab setting. Physiatrist is providing close team supervision and 24 hour management of active medical problems listed below. Physiatrist and rehab team continue to assess barriers to discharge/monitor patient progress toward functional and medical goals. FIM: FIM - Bathing Bathing Steps Patient Completed: Chest;Right Arm;Abdomen Bathing: 3: Mod-Patient completes 5-7 70f10 parts or 50-74%  FIM - Upper Body Dressing/Undressing Upper body dressing/undressing steps patient completed: Thread/unthread left sleeve of pullover shirt/dress Upper body dressing/undressing: 2: Max-Patient completed 25-49% of tasks FIM - Lower Body Dressing/Undressing Lower body dressing/undressing  steps patient completed: Don/Doff left sock;Don/Doff right shoe;Don/Doff right sock;Don/Doff left shoe Lower body dressing/undressing: 3: Mod-Patient completed 50-74% of tasks  FIM - Toileting Toileting steps completed by patient: Adjust clothing prior to toileting Toileting Assistive Devices: Grab bar or rail for support Toileting: 2: Max-Patient completed 1 of 3 steps  FIM - Radio producer Devices: Elevated toilet seat;Grab bars Toilet Transfers: 4-To toilet/BSC: Min A (steadying Pt. > 75%);4-From toilet/BSC: Min A (steadying Pt. > 75%)  FIM - Bed/Chair Transfer Bed/Chair Transfer Assistive Devices: Arm rests Bed/Chair Transfer: 4: Chair or W/C > Bed: Min A (steadying Pt. > 75%)  FIM - Locomotion: Wheelchair Distance: 10 Locomotion: Wheelchair: 0: Activity did not occur FIM - Locomotion: Ambulation Locomotion: Ambulation Assistive  Devices: Nurse, adult (and no AD) Ambulation/Gait Assistance: 3: Mod assist Locomotion: Ambulation: 2: Travels 50 - 149 ft with moderate assistance (Pt: 50 - 74%)  Comprehension Comprehension Mode: Auditory Comprehension: 5-Follows basic conversation/direction: With extra time/assistive device  Expression Expression Mode: Verbal Expression: 3-Expresses basic 50 - 74% of the time/requires cueing 25 - 50% of the time. Needs to repeat parts of sentences.  Social Interaction Social Interaction: 5-Interacts appropriately 90% of the time - Needs monitoring or encouragement for participation or interaction.  Problem Solving Problem Solving: 5-Solves basic problems: With no assist  Memory Memory: 5-Recognizes or recalls 90% of the time/requires cueing < 10% of the time  Medical Problem List and Plan:  1. Left internal capsule/deep white matter infarcts felt to be embolic secondary to unknown source  2. DVT Prophylaxis/Anticoagulation: Subcutaneous Lovenox. Monitor platelet counts and any signs of bleeding  3. Pain Management: Tylenol as needed  4. Mood/PTSD/depression. Wellbutrin 150 mg daily, BuSpar 5 mg twice a day as needed, Depakote 1000 mg each bedtime, effexor 150 mg twice a day and trazodone 300 mg each bedtime - CP yest likely anxiety related may have excess serotonin, reduce venlafaxine to qam only, consult to neuropsych 5. Neuropsych: This patient is not yet capable of making decisions on his own behalf.  6. Diabetes mellitus with peripheral neuropathy.uncontrolled titrating Lantus --added am dose yesterday  Hemoglobin A1c 7.4. Currently on sliding scale insulin.   Check blood sugars a.c. and at bedtime resume oral agents as directed   -see below 7. Hypertension. Lisinopril 10 mg day. Monitor with increased activity  8. Hypothyroidism. Synthroid .TSH 4.045  9. Hyperlipidemia. Lipitor  10. GERD. Protonix  11. Left lateral lower extremity ulcer. Followup wound care nurse with  dressing changes as directed/Unna boot 12. Lethargy: likely uroseptic, UA +  -appears to be improving although still far from baseline  -urine culture pending, cxr unremarkable. Valproic acid level ok  -continue zosyn for now  -wbc's down.   -change IVF to NS with K+  -adjusted lantus insulin yesterday. Cover with [employer]SI for now.  LOS (Days) 6 A FACE TO FACE EVALUATION WAS PERFORMED  SWARTZ,ZACHARY T 09/26/2013, 9:13 AM

## 2013-09-26 NOTE — Progress Notes (Signed)
Patient restless throughout this shift.  No further complaints of nausea after Zofran administered.  Patient did rest quietly after Tylenol administered for generalized discomfort.  Zosyn continued.  Wife remains at bedside.    Fredna Dow M

## 2013-09-26 NOTE — Plan of Care (Signed)
Problem: RH BLADDER ELIMINATION Goal: RH STG MANAGE BLADDER WITH ASSISTANCE STG Manage Bladder With Minimal Assistance  Outcome: Not Progressing Remains incontinent.  Condom cath at [facility] .

## 2013-09-27 ENCOUNTER — Inpatient Hospital Stay (HOSPITAL_COMMUNITY): Payer: Medicare HMO

## 2013-09-27 ENCOUNTER — Inpatient Hospital Stay (HOSPITAL_COMMUNITY): Payer: Medicare HMO | Admitting: Rehabilitation

## 2013-09-27 ENCOUNTER — Inpatient Hospital Stay (HOSPITAL_COMMUNITY): Payer: Medicare HMO | Admitting: Occupational Therapy

## 2013-09-27 ENCOUNTER — Encounter (HOSPITAL_COMMUNITY): Payer: Medicare HMO | Admitting: Occupational Therapy

## 2013-09-27 ENCOUNTER — Inpatient Hospital Stay (HOSPITAL_COMMUNITY): Payer: Medicare HMO | Admitting: Speech Pathology

## 2013-09-27 DIAGNOSIS — E1142 Type 2 diabetes mellitus with diabetic polyneuropathy: Secondary | ICD-10-CM

## 2013-09-27 DIAGNOSIS — I6340 Cerebral infarction due to embolism of unspecified cerebral artery: Secondary | ICD-10-CM

## 2013-09-27 DIAGNOSIS — E1149 Type 2 diabetes mellitus with other diabetic neurological complication: Secondary | ICD-10-CM

## 2013-09-27 DIAGNOSIS — G8110 Spastic hemiplegia affecting unspecified side: Secondary | ICD-10-CM

## 2013-09-27 DIAGNOSIS — J449 Chronic obstructive pulmonary disease, unspecified: Secondary | ICD-10-CM

## 2013-09-27 LAB — BASIC METABOLIC PANEL
BUN: 13 mg/dL (ref 6–23)
CALCIUM: 8.9 mg/dL (ref 8.4–10.5)
CO2: 30 mEq/L (ref 19–32)
Chloride: 91 mEq/L — ABNORMAL LOW (ref 96–112)
Creatinine, Ser: 0.78 mg/dL (ref 0.50–1.35)
GFR calc Af Amer: >90 mL/min (ref >90)
GFR calc non Af Amer: >90 mL/min (ref >90)
Glucose, Bld: 178 mg/dL — ABNORMAL HIGH (ref 70–99)
Potassium: 3.5 mEq/L — ABNORMAL LOW (ref 3.7–5.3)
SODIUM: 134 meq/L — AB (ref 137–147)

## 2013-09-27 LAB — URINE CULTURE

## 2013-09-27 LAB — GLUCOSE, CAPILLARY
GLUCOSE-CAPILLARY: 174 mg/dL — AB (ref 70–99)
Glucose-Capillary: 158 mg/dL — ABNORMAL HIGH (ref 70–99)
Glucose-Capillary: 210 mg/dL — ABNORMAL HIGH (ref 70–99)
Glucose-Capillary: 207 mg/dL — ABNORMAL HIGH (ref 70–99)

## 2013-09-27 MED ORDER — LORAZEPAM 2 MG/ML IJ SOLN
INTRAMUSCULAR | Status: AC
Start: 2013-09-27 — End: 2013-09-27
  Administered 2013-09-27: 14:00:00
  Filled 2013-09-27: qty 1

## 2013-09-27 MED ORDER — SULFAMETHOXAZOLE-TMP DS 800-160 MG PO TABS
1.0000 | ORAL_TABLET | Freq: Two times a day (BID) | ORAL | Status: AC
  Administered 2013-09-27 – 2013-09-29 (×4): 1 via ORAL
  Filled 2013-09-27 (×5): qty 1

## 2013-09-27 MED ORDER — LORAZEPAM 2 MG/ML IJ SOLN
1.0000 mg | Freq: Once | INTRAMUSCULAR | Status: AC
  Administered 2013-09-27: 1 mg via INTRAVENOUS

## 2013-09-27 MED ORDER — INSULIN GLARGINE 100 UNIT/ML ~~LOC~~ SOLN
15.0000 [IU] | Freq: Every day | SUBCUTANEOUS | Status: DC
  Administered 2013-09-28: 15 [IU] via SUBCUTANEOUS
  Filled 2013-09-27 (×2): qty 0.15

## 2013-09-27 NOTE — Progress Notes (Signed)
Occupational Therapy Session Note  Patient Details  Name: Brad Taylor MRN: [id number] Date of Birth: Sep 25, 1950  Today's Date: 09/27/2013 Time: 1100-1200 Time Calculation (min): 60 min  Short Term Goals: Week 1:  OT Short Term Goal 1 (Week 1): Pt will perform all bathing with min assist using AE for sit to stand.   OT Short Term Goal 2 (Week 1): Pt will donn pullover shirt with min assist following hemi technques. OT Short Term Goal 3 (Week 1): Pt will donn pants and shoes with AE and min assist. OT Short Term Goal 4 (Week 1): Pt will perform toilet transfers with supervision. OT Short Term Goal 5 (Week 1): Pt/family will return demonstrate AAROM/PROM exercises for the RUE.  Skilled Therapeutic Interventions/Progress Updates:    Pt lethargic and in bed to start session.  Needed mod instructional cueing to wake up and keep his eyes open.  Pt with very dysarthric speech with only approximately 25% intelligibility.  He needed min assist for supine to sit and for transfer to the wheelchair.  He rolled to the sink in the wheelchair for bathing.  Brad Taylor needed mod instructional cueing to sequence bathing tasks.  He demonstrated slightly more motor planning difficulty with attempted use of the reacher and the LH sponge for removing his socks and washing and drying his feet.  He needed mod assist to donn pants over feet with mod assist to pull pants over his hips.  Therapist assisted with donning shoes.  Pt with incontinent episode as well when attempting to pull up his pants.    Therapy Documentation Precautions:  Precautions Precautions: Fall Precaution Comments: R sided weakness (RUE>RLE), dysarthria Restrictions Weight Bearing Restrictions: No  Pain: Pain Assessment Pain Assessment: No/denies pain Pain Score: 0-No pain ADL: See FIM for current functional status  Therapy/Group: Individual Therapy  Airon Sahni OTR/L 09/27/2013, 12:16 PM

## 2013-09-27 NOTE — Progress Notes (Signed)
Reports by therapies early this a.m. of increased lean therapies. MRI was ordered but results not made available contact made by radiology services noting stroke however he was not identified and neurology services contacted in regards to followup. Patient has attended therapies this morning spoke to wife with regards to appetite being much improved. Advised to continue Plavix at this time and neurology service to followup

## 2013-09-27 NOTE — Progress Notes (Signed)
Physical Therapy Session Note  Patient Details  Name: Brad Taylor MRN: [id number] Date of Birth: 02-13-1951  Today's Date: 09/27/2013 Time: 0830-0920 Time Calculation (min): 50 min  Short Term Goals: Week 1:  PT Short Term Goal 1 (Week 1): Pt will perform stand pivot transfers with LRAD at min assist level PT Short Term Goal 2 (Week 1): Pt will perform standing balance activities at min assist level PT Short Term Goal 3 (Week 1): Pt will self propel w/c using L hemi technique at supervision level x 100' PT Short Term Goal 4 (Week 1): Pt will ambulate x 100' with LRAD at min assist level.   Skilled Therapeutic Interventions/Progress Updates:   Pt received lying in bed this morning, notably fatigued/groggy with speech noted to be very dysarthric and unintelligible.  Note pts brief and bed pad to be saturated in urine.  Pt unable to state that he felt wet at all.  Had pt sit up on EOB via SL to sit.  Pt noted to require max verbal instructional cues and increased time to process one step verbal commands.  Pt finally able to get into L SL then requires tactile and max verbal cues for bringing LEs out of bed and assist at trunk to facilitate weight shift.  Once sitting at EOB, note pts sitting balance to be poor and tends to lose balance to the R several times.  Had pt stand x 3 reps in order to remove soiled brief, perform peri care (pt able to perform on L side) and don new brief.  Note pts standing balance to be poor today as well (compared to previous sessions) and requires mod assist and max verbal cues to maintain upright posture.  Performed stand pivot to w/c with mod assist with verbal, visual and step by step instructional cues for stepping sequence to get to chair.  Assisted pt to gym and notified RN of changes seen in pt this morning.  PA also notified (per RN).  In gym, performed stand step transfer w/c <> mat at mod assist with same cues mentioned above.  While seated at [facility], performing  reaching activity with LUE with back unsupported and feet supported in order to facilitate increased trunk shortening on the L.  Pt noted to compensate with lateral and forward trunk lean only and did not note very much activation of obliques.  Again, pt demonstrating very lethargic behavior with decreased initiation and delayed processing.  BP taken and read 147/72.  Assisted pt back to chair and back to room.  RN notified PT that MRI would be taken later today.  Quick release belt donned and all needs in reach.  Family in room.    Therapy Documentation Precautions:  Precautions Precautions: Fall Precaution Comments: R sided weakness (RUE>RLE), dysarthria Restrictions Weight Bearing Restrictions: No General: Amount of Missed PT Time (min): 10 Minutes Missed Time Reason: Patient fatigue Vital Signs: Therapy Vitals Temp: 97.8 F (36.6 C) Temp src: Oral Pulse Rate: 83 Resp: 19 BP: 144/90 mmHg Patient Position, if appropriate: Lying Oxygen Therapy SpO2: 94 % O2 Device: None (Room air) Pain: Pain Assessment Pain Assessment: No/denies pain Pain Score: 0-No pain     See FIM for current functional status  Therapy/Group: Individual Therapy  Denice Bors 09/27/2013, 9:26 AM

## 2013-09-27 NOTE — Progress Notes (Signed)
Subjective/Complaints: 63 y.o. male with history of COPD, diabetes mellitus and peripheral neuropathy, PTSD. Admitted 09/15/2013 with right-sided weakness and slurred speech. MRI of the brain showed acute ischemic infarct involving the posterior limb of the left internal capsule as well as additional subcentimeter ischemic infarct deep white matter of the left centrum semiovale and remote lacunar infarct bilateral basal ganglia. MRA of the head with no stenosis or aneurysm. Echocardiogram is pending. Carotid Dopplers with no ICA stenosis. Patient did not receive TPA. Neurology services consulted placed on aspirin/Plavix for CVA prophylaxis as well as subcutaneous Lovenox for DVT prophylaxis. Maintained on a regular consistency diet  A little restless last night. Wife reports improved alertness however in the evening and this am Review of Systems -   Objective: Vital Signs: Blood pressure 144/90, pulse 83, temperature 97.8 F (36.6 C), temperature source Oral, resp. rate 19, weight 125.4 kg (276 lb 7.3 oz), SpO2 94.00%. Dg Chest 2 View  09/25/2013   CLINICAL DATA:  Followup partial small bowel obstruction  EXAM: CHEST  2 VIEW  COMPARISON:  09/15/2013  FINDINGS: Enlargement of cardiac silhouette.  Atherosclerotic calcification aorta.  Mediastinal contours and pulmonary vascularity normal.  Mild right basilar atelectasis.  Lungs otherwise clear.  No pleural effusion or pneumothorax.  IMPRESSION: Enlargement of cardiac silhouette.  Right basilar atelectasis.   Electronically Signed   By: Lavonia Dana M.D.   On: 09/25/2013 15:36   Results for orders placed during the hospital encounter of 09/20/13 (from the past 72 hour(s))  GLUCOSE, CAPILLARY     Status: Abnormal   Collection Time    09/24/13 11:33 AM      Result Value Ref Range   Glucose-Capillary 309 (*) 70 - 99 mg/dL   Comment 1 Notify RN    GLUCOSE, CAPILLARY     Status: Abnormal   Collection Time    09/24/13  4:27 PM      Result Value Ref  Range   Glucose-Capillary 269 (*) 70 - 99 mg/dL   Comment 1 Notify RN    GLUCOSE, CAPILLARY     Status: Abnormal   Collection Time    09/24/13  8:45 PM      Result Value Ref Range   Glucose-Capillary 257 (*) 70 - 99 mg/dL   Comment 1 Notify RN    GLUCOSE, CAPILLARY     Status: Abnormal   Collection Time    09/25/13  7:10 AM      Result Value Ref Range   Glucose-Capillary 235 (*) 70 - 99 mg/dL   Comment 1 Notify RN    COMPREHENSIVE METABOLIC PANEL     Status: Abnormal   Collection Time    09/25/13  9:25 AM      Result Value Ref Range   Sodium 131 (*) 137 - 147 mEq/L   Potassium 3.8  3.7 - 5.3 mEq/L   Chloride 90 (*) 96 - 112 mEq/L   CO2 26  19 - 32 mEq/L   Glucose, Bld 243 (*) 70 - 99 mg/dL   BUN 13  6 - 23 mg/dL   Creatinine, Ser 0.70  0.50 - 1.35 mg/dL   Calcium 9.8  8.4 - 10.5 mg/dL   Total Protein 7.4  6.0 - 8.3 g/dL   Albumin 3.2 (*) 3.5 - 5.2 g/dL   AST 33  0 - 37 U/L   ALT 61 (*) 0 - 53 U/L   Alkaline Phosphatase 120 (*) 39 - 117 U/L   Total Bilirubin 0.6  0.3 - 1.2 mg/dL   GFR calc non Af Amer >90  >90 mL/min   GFR calc Af Amer >90  >90 mL/min   Comment: (NOTE)     The eGFR has been calculated using the CKD EPI equation.     This calculation has not been validated in all clinical situations.     eGFR's persistently <90 mL/min signify possible Chronic Kidney     Disease.  VALPROIC ACID LEVEL     Status: None   Collection Time    09/25/13  9:25 AM      Result Value Ref Range   Valproic Acid Lvl 55.9  50.0 - 100.0 ug/mL  CBC     Status: Abnormal   Collection Time    09/25/13  9:25 AM      Result Value Ref Range   WBC 16.4 (*) 4.0 - 10.5 K/uL   RBC 4.85  4.22 - 5.81 MIL/uL   Hemoglobin 15.6  13.0 - 17.0 g/dL   HCT 43.2  39.0 - 52.0 %   MCV 89.1  78.0 - 100.0 fL   MCH 32.2  26.0 - 34.0 pg   MCHC 36.1 (*) 30.0 - 36.0 g/dL   RDW 13.0  11.5 - 15.5 %   Platelets 154  150 - 400 K/uL  TSH     Status: None   Collection Time    09/25/13  9:25 AM      Result  Value Ref Range   TSH 4.158  0.350 - 4.500 uIU/mL   Comment: Performed at [place]     Status: None   Collection Time    09/25/13 10:09 AM      Result Value Ref Range   Specimen Description URINE, CATHETERIZED     Special Requests NONE     Culture  Setup Time       Value: 09/25/2013 10:46     Performed at [facility]       Value: >=100,000 COLONIES/ML     Performed at [facility]   Culture       Value: [place]     Performed at [facility]   Report Status PENDING    URINALYSIS, ROUTINE W REFLEX MICROSCOPIC     Status: Abnormal   Collection Time    09/25/13 10:09 AM      Result Value Ref Range   Color, Urine AMBER (*) YELLOW   Comment: BIOCHEMICALS MAY BE AFFECTED BY COLOR   APPearance CLOUDY (*) CLEAR   Specific Gravity, Urine 1.034 (*) 1.005 - 1.030   pH 5.5  5.0 - 8.0   Glucose, UA 500 (*) NEGATIVE mg/dL   Hgb urine dipstick MODERATE (*) NEGATIVE   Bilirubin Urine MODERATE (*) NEGATIVE   Ketones, ur >80 (*) NEGATIVE mg/dL   Protein, ur 30 (*) NEGATIVE mg/dL   Urobilinogen, UA 1.0  0.0 - 1.0 mg/dL   Nitrite POSITIVE (*) NEGATIVE   Leukocytes, UA SMALL (*) NEGATIVE  URINE MICROSCOPIC-ADD ON     Status: Abnormal   Collection Time    09/25/13 10:09 AM      Result Value Ref Range   Squamous Epithelial / LPF FEW (*) RARE   WBC, UA 11-20  <3 WBC/hpf   RBC / HPF 3-6  <3 RBC/hpf   Bacteria, UA FEW (*) RARE   Urine-Other MUCOUS PRESENT    GLUCOSE, CAPILLARY     Status: Abnormal  Collection Time    09/25/13 11:41 AM      Result Value Ref Range   Glucose-Capillary 276 (*) 70 - 99 mg/dL   Comment 1 Notify RN    GLUCOSE, CAPILLARY     Status: Abnormal   Collection Time    09/25/13  4:36 PM      Result Value Ref Range   Glucose-Capillary 257 (*) 70 - 99 mg/dL   Comment 1 Notify RN    GLUCOSE, CAPILLARY     Status: Abnormal   Collection Time    09/25/13  9:07 PM      Result Value Ref Range    Glucose-Capillary 238 (*) 70 - 99 mg/dL   Comment 1 Notify RN    CBC     Status: None   Collection Time    09/26/13  5:00 AM      Result Value Ref Range   WBC 7.6  4.0 - 10.5 K/uL   RBC 4.44  4.22 - 5.81 MIL/uL   Hemoglobin 13.9  13.0 - 17.0 g/dL   HCT 39.5  39.0 - 52.0 %   MCV 89.0  78.0 - 100.0 fL   MCH 31.3  26.0 - 34.0 pg   MCHC 35.2  30.0 - 36.0 g/dL   RDW 13.1  11.5 - 15.5 %   Platelets 155  150 - 400 K/uL  BASIC METABOLIC PANEL     Status: Abnormal   Collection Time    09/26/13  5:00 AM      Result Value Ref Range   Sodium 128 (*) 137 - 147 mEq/L   Potassium 3.5 (*) 3.7 - 5.3 mEq/L   Chloride 89 (*) 96 - 112 mEq/L   CO2 27  19 - 32 mEq/L   Glucose, Bld 239 (*) 70 - 99 mg/dL   BUN 18  6 - 23 mg/dL   Creatinine, Ser 0.82  0.50 - 1.35 mg/dL   Calcium 9.2  8.4 - 10.5 mg/dL   GFR calc non Af Amer >90  >90 mL/min   GFR calc Af Amer >90  >90 mL/min   Comment: (NOTE)     The eGFR has been calculated using the CKD EPI equation.     This calculation has not been validated in all clinical situations.     eGFR's persistently <90 mL/min signify possible Chronic Kidney     Disease.  GLUCOSE, CAPILLARY     Status: Abnormal   Collection Time    09/26/13  7:08 AM      Result Value Ref Range   Glucose-Capillary 207 (*) 70 - 99 mg/dL   Comment 1 Notify RN    GLUCOSE, CAPILLARY     Status: Abnormal   Collection Time    09/26/13 11:32 AM      Result Value Ref Range   Glucose-Capillary 228 (*) 70 - 99 mg/dL   Comment 1 Notify RN    GLUCOSE, CAPILLARY     Status: Abnormal   Collection Time    09/26/13  4:48 PM      Result Value Ref Range   Glucose-Capillary 223 (*) 70 - 99 mg/dL   Comment 1 Notify RN    GLUCOSE, CAPILLARY     Status: Abnormal   Collection Time    09/26/13  8:40 PM      Result Value Ref Range   Glucose-Capillary 214 (*) 70 - 99 mg/dL  BASIC METABOLIC PANEL     Status: Abnormal   Collection Time  09/27/13  4:29 AM      Result Value Ref Range   Sodium 134  (*) 137 - 147 mEq/L   Potassium 3.5 (*) 3.7 - 5.3 mEq/L   Chloride 91 (*) 96 - 112 mEq/L   CO2 30  19 - 32 mEq/L   Glucose, Bld 178 (*) 70 - 99 mg/dL   BUN 13  6 - 23 mg/dL   Creatinine, Ser 0.78  0.50 - 1.35 mg/dL   Calcium 8.9  8.4 - 10.5 mg/dL   GFR calc non Af Amer >90  >90 mL/min   GFR calc Af Amer >90  >90 mL/min   Comment: (NOTE)     The eGFR has been calculated using the CKD EPI equation.     This calculation has not been validated in all clinical situations.     eGFR's persistently <90 mL/min signify possible Chronic Kidney     Disease.  GLUCOSE, CAPILLARY     Status: Abnormal   Collection Time    09/27/13  7:26 AM      Result Value Ref Range   Glucose-Capillary 158 (*) 70 - 99 mg/dL   Comment 1 Notify RN         General: No acute distress Mood and affect are appropriate Heart: Regular rate and rhythm no rubs murmurs or extra sounds Lungs: Clear to auscultation, breathing unlabored, no rales or wheezes, poor inspiratory effort Abdomen: Positive bowel sounds, soft nontender to palpation, nondistended Extremities: No clubbing, cyanosis, or edema Skin: No evidence of breakdown, no evidence of rash Neurologic: pt is more arousable. Could tell me he was at [facility]. Voice clearer. Cranial nerves II through XII intact, motor strength is 5/5 in left deltoid, bicep, tricep, grip, hip flexor, knee extensors, ankle dorsiflexor and plantar flexor, 3- RUE, 4- R KE, 1/5 R ankle Sensory exam Cerebellar exam normal finger to nose to finger as well as heel to shin in bilateral upper and lower extremities Musculoskeletal: Full range of motion in all 4 extremities. No joint swelling  Assessment/Plan: 1. Functional deficits secondary to embolic left internal capsule/deep white matter infarct   which require 3+ hours per day of interdisciplinary therapy in a comprehensive inpatient rehab setting. Physiatrist is providing close team supervision and 24 hour management of active medical  problems listed below. Physiatrist and rehab team continue to assess barriers to discharge/monitor patient progress toward functional and medical goals. FIM: FIM - Bathing Bathing Steps Patient Completed: Chest;Right Arm;Abdomen Bathing: 3: Mod-Patient completes 5-7 [id number] parts or 50-74%  FIM - Upper Body Dressing/Undressing Upper body dressing/undressing steps patient completed: Thread/unthread left sleeve of pullover shirt/dress Upper body dressing/undressing: 2: Max-Patient completed 25-49% of tasks FIM - Lower Body Dressing/Undressing Lower body dressing/undressing steps patient completed: Don/Doff left sock;Don/Doff right shoe;Don/Doff right sock;Don/Doff left shoe Lower body dressing/undressing: 3: Mod-Patient completed 50-74% of tasks  FIM - Toileting Toileting steps completed by patient: Adjust clothing prior to toileting Toileting Assistive Devices: Grab bar or rail for support Toileting: 2: Max-Patient completed 1 of 3 steps  FIM - TRadio producerDevices: Elevated toilet seat;Grab bars Toilet Transfers: 1-Two helpers  FIM - BControl and instrumentation engineerDevices: Arm rests Bed/Chair Transfer: 1: Two helpers  FIM - Locomotion: Wheelchair Distance: 10 Locomotion: Wheelchair: 0: Activity did not occur FIM - Locomotion: Ambulation Locomotion: Ambulation Assistive Devices: CNurse, adult(and no AD) Ambulation/Gait Assistance: 3: Mod assist Locomotion: Ambulation: 2: Travels 50 - 149 ft with moderate assistance (Pt: 50 -  74%)  Comprehension Comprehension Mode: Auditory Comprehension: 5-Follows basic conversation/direction: With extra time/assistive device  Expression Expression Mode: Verbal Expression: 3-Expresses basic 50 - 74% of the time/requires cueing 25 - 50% of the time. Needs to repeat parts of sentences.  Social Interaction Social Interaction: 5-Interacts appropriately 90% of the time - Needs monitoring or  encouragement for participation or interaction.  Problem Solving Problem Solving: 5-Solves basic problems: With no assist  Memory Memory: 5-Recognizes or recalls 90% of the time/requires cueing < 10% of the time  Medical Problem List and Plan:  1. Left internal capsule/deep white matter infarcts felt to be embolic secondary to unknown source  2. DVT Prophylaxis/Anticoagulation: Subcutaneous Lovenox. Monitor platelet counts and any signs of bleeding  3. Pain Management: Tylenol as needed  4. Mood/PTSD/depression. Wellbutrin 150 mg daily, BuSpar 5 mg twice a day as needed, Depakote 1000 mg each bedtime, effexor 150 mg once a day and trazodone 300 mg each bedtime -consult to neuropsych 5. Neuropsych: This patient is not yet capable of making decisions on his own behalf.  6. Diabetes mellitus with peripheral neuropathy.uncontrolled titrating Lantus --added am dose   Hemoglobin A1c 7.4. Currently on sliding scale insulin.   Check blood sugars a.c. and at bedtime resume oral agents as directed   -see below 7. Hypertension. Lisinopril 10 mg day. Monitor with increased activity  8. Hypothyroidism. Synthroid .TSH 4.045  9. Hyperlipidemia. Lipitor  10. GERD. Protonix  11. Left lateral lower extremity ulcer. Followup wound care nurse with dressing changes as directed/Unna boot 12. Lethargy: likely uroseptic, UA +  -appears to be improving although still far from baseline  -urine culture pending, cxr unremarkable. Valproic acid level ok  -continue zosyn for now  -wbc's down.   -change IVF to NS with K+  -adjust lantus insulin daily. Cover with SSI for now.  LOS (Days) 7 A FACE TO FACE EVALUATION WAS PERFORMED  Lailie Smead E 09/27/2013, 8:17 AM

## 2013-09-27 NOTE — Progress Notes (Signed)
Inpatient Diabetes Program Recommendations  AACE/ADA: New Consensus Statement on Inpatient Glycemic Control (2013)  Target Ranges:  Prepandial:   less than 140 mg/dL      Peak postprandial:   less than 180 mg/dL (1-2 hours)      Critically ill patients:  140 - 180 mg/dL   Reason for Visit: Post-prandial hyperglycemia  Current orders for Inpatient glycemic control: Moderate correction tidwc and lantus 15 and 30 units for total of 45 units basal per day.  Inpatient Diabetes Program Recommendations Insulin - Basal: Noted inrease in lantus to 15 units am and 30 units at HS. Should be helpful to normalize fasting glucose. (However glucose this am was at 158 mg/dL) Insulin - Meal Coverage: Pt would benefit from meal coverage as well.  Please add 3 units meal coverage tidwc as well as correction. Pt will get meal coverage if pt eats >= 50% of meal  Thank you, Rosita Kea, RN, CNS, Diabetes Coordinator [phone])

## 2013-09-27 NOTE — Progress Notes (Signed)
Occupational Therapy Cancellation Note  Patient Details  Name: Brad Taylor MRN: [id number] Date of Birth: 11-29-1950 Today's Date: 09/27/2013  Pt missed 45 mins of OT session secondary to being gone for MRI.  Therapist attempted to come back after MRI was complete, however pt was sedated for the procedure and was still very difficult to arouse.  Nursing made aware of situation.  Will resume therapies on 2/24 if MD allows.  Jerrie Schussler OTR/L 09/27/2013, 3:23 PM

## 2013-09-27 NOTE — Progress Notes (Signed)
SLP Cancellation Note  Patient Details Name: Brad Taylor MRN: [id number] DOB: Nov 26, 1950   Cancelled treatment:       Pt missed 30 minutes of skilled SLP intervention due to off unit for MRI.    [place], [place] 09/27/2013, 1:55 PM

## 2013-09-28 ENCOUNTER — Encounter (HOSPITAL_COMMUNITY): Payer: Medicare HMO | Admitting: Occupational Therapy

## 2013-09-28 ENCOUNTER — Inpatient Hospital Stay (HOSPITAL_COMMUNITY): Payer: Medicare HMO | Admitting: Rehabilitation

## 2013-09-28 ENCOUNTER — Inpatient Hospital Stay (HOSPITAL_COMMUNITY): Payer: Medicare HMO | Admitting: Occupational Therapy

## 2013-09-28 ENCOUNTER — Inpatient Hospital Stay (HOSPITAL_COMMUNITY): Payer: Medicare HMO | Admitting: Speech Pathology

## 2013-09-28 DIAGNOSIS — I6350 Cerebral infarction due to unspecified occlusion or stenosis of unspecified cerebral artery: Secondary | ICD-10-CM

## 2013-09-28 LAB — GLUCOSE, CAPILLARY
GLUCOSE-CAPILLARY: 152 mg/dL — AB (ref 70–99)
Glucose-Capillary: 193 mg/dL — ABNORMAL HIGH (ref 70–99)
Glucose-Capillary: 287 mg/dL — ABNORMAL HIGH (ref 70–99)
Glucose-Capillary: 216 mg/dL — ABNORMAL HIGH (ref 70–99)

## 2013-09-28 MED ORDER — SALINE SPRAY 0.65 % NA SOLN
1.0000 | NASAL | Status: DC | PRN
  Filled 2013-09-28: qty 44

## 2013-09-28 NOTE — Progress Notes (Signed)
Physical Therapy Session Note  Patient Details  Name: Brad Taylor MRN: [id number] Date of Birth: [birth year]  Today's Date: 09/28/2013 Time: 0910-1000 Time Calculation (min): 50 min  Short Term Goals: Week 1:  PT Short Term Goal 1 (Week 1): Pt will perform stand pivot transfers with LRAD at min assist level PT Short Term Goal 2 (Week 1): Pt will perform standing balance activities at min assist level PT Short Term Goal 3 (Week 1): Pt will self propel w/c using L hemi technique at supervision level x 100' PT Short Term Goal 4 (Week 1): Pt will ambulate x 100' with LRAD at min assist level.   Skilled Therapeutic Interventions/Progress Updates:   Pt received in therapy gym in w/c, agreeable to therapy this morning.  Performed stand pivot transfer w/c >mat at min/mod assist with continued cues for scooting forward prior to standing, stepping sequence, and getting BLEs back to chair prior to sitting.  Also max cues for controlled descent when sitting.  Once in sitting on EOM, performed sit >supine at min assist with cues for SL to supine technique for increased trunk flex and rotation.  Once in supine focused on NMR for RLE with BLE bridging x 10 reps, R LE only bridging x 10 reps, SAQ's x 10 reps, and SLR x 10 reps RLE.  Pt requires mod cues for redirection to task, maintaining sustained attention and cues to remember what task was being performed.  Performed supine to L SL with total assist cues for attending to RUE during rolling.  Once in SL, pt able to get into sitting with min assist, however again requires max verbal cues for technique and hand placement.  Performed sit <> stand x 2 reps with LOB posteriorly and to the R with each rep.  Performed again with max verbal cues for scooting forward and tactile cues at quad and glute on R for increased activation, as well as cues for slow transition to standing, as he tends to "throw his weight" backwards.  Ambulated to ortho gym (approx 80') without  AD at mod assist with intermittent LOB to the R due to decreased glute/quad activation.  Note pt with very shuffled gait pattern, however better able to clear RLE when slightly facilitated to increase weight shift to the L.  Performed car transfer in ortho gym at home vehicle height at min assist and max verbal cues for correct and safe technique.  Pt unable to problem solve how to get out of car without using grab bar, therefore requires total assist cues for sequencing.  Pt returned to w/c and was assisted back to room.  Left in w/c with quick release belt donned and all needs in reach.  Wife in room.   Therapy Documentation Precautions:  Precautions Precautions: Fall Precaution Comments: R sided weakness (RUE>RLE), dysarthria Restrictions Weight Bearing Restrictions: No General: Amount of Missed PT Time (min): 10 Minutes Missed Time Reason: Other (comment) (therapist late) Vital Signs:   Pain: Pain Assessment Pain Assessment: No/denies pain Pain Score: 0-No pain   Locomotion : Ambulation Ambulation/Gait Assistance: 3: Mod assist   See FIM for current functional status  Therapy/Group: Individual Therapy  Denice Bors 09/28/2013, 10:20 AM

## 2013-09-28 NOTE — Progress Notes (Addendum)
Subjective/Complaints: 63 y.o. male with history of COPD, diabetes mellitus and peripheral neuropathy, PTSD. Admitted 09/15/2013 with right-sided weakness and slurred speech. MRI of the brain showed acute ischemic infarct involving the posterior limb of the left internal capsule as well as additional subcentimeter ischemic infarct deep white matter of the left centrum semiovale and remote lacunar infarct bilateral basal ganglia. MRA of the head with no stenosis or aneurysm. Echocardiogram is pending. Carotid Dopplers with no ICA stenosis. Patient did not receive TPA. Neurology services consulted placed on aspirin/Plavix for CVA prophylaxis as well as subcutaneous Lovenox for DVT prophylaxis. Maintained on a regular consistency diet  No new issues, reviewed MRI, no new infarcts, prior infarct extended Review of Systems -   Objective: Vital Signs: Blood pressure 148/90, pulse 73, temperature 98.5 F (36.9 C), temperature source Oral, resp. rate 18, weight 125.4 kg (276 lb 7.3 oz), SpO2 94.00%. Brad Taylor Head Wo Contrast  09/27/2013   CLINICAL DATA:  Mental status changes. Right-sided weakness and slurred speech.  EXAM: MRI HEAD WITHOUT CONTRAST  MRA HEAD WITHOUT CONTRAST  TECHNIQUE: Multiplanar, multiecho pulse sequences of the brain and surrounding structures were obtained without intravenous contrast. Angiographic images of the head were obtained using MRA technique without contrast.  COMPARISON:  CT HEAD W/O CM dated 09/19/2013; Brad HEAD W/O CM dated 09/16/2013  FINDINGS: MRI HEAD FINDINGS  The patient was unable to remain motionless for the exam. Small or subtle lesions could be overlooked.  There has been progression of the previously identified deep white matter infarct involving the posterior basal ganglia, posterior limb internal capsule, and centrum semiovale. Cross-sectional measurements today are 10 x 23 mm as compared with 8 x 14 mm previously. This is evident on both the DWI sequences well as T2 and  FLAIR images. Tiny focus of subcortical acute infarction near the posterior frontoparietal junction at the convexity shows progression to subacute status, and has therefore not progressed. No other new infarcts.  Advanced atrophy and chronic microvascular ischemic change. Numerous remote lacunar infarcts. No osseous findings. Extracranial soft tissues unremarkable.  MRA HEAD FINDINGS  Unremarkable internal carotid arteries and basilar artery. Vertebrals codominant. No proximal flow limiting stenosis of the anterior, middle, or posterior cerebral arteries. Within limits of visualization on this motion degraded exam, No cerebellar branch occlusion. No intracranial aneurysm.  IMPRESSION: Slight progression of the previously identified deep white matter infarct involving posterior basal ganglia, posterior limb internal capsule, and centrum semiovale; no hemorrhage. See discussion above.   Electronically Signed   By: Rolla Flatten M.D.   On: 09/27/2013 14:51   Brad Brain Wo Contrast  09/27/2013   CLINICAL DATA:  Mental status changes. Right-sided weakness and slurred speech.  EXAM: MRI HEAD WITHOUT CONTRAST  MRA HEAD WITHOUT CONTRAST  TECHNIQUE: Multiplanar, multiecho pulse sequences of the brain and surrounding structures were obtained without intravenous contrast. Angiographic images of the head were obtained using MRA technique without contrast.  COMPARISON:  CT HEAD W/O CM dated 09/19/2013; Brad HEAD W/O CM dated 09/16/2013  FINDINGS: MRI HEAD FINDINGS  The patient was unable to remain motionless for the exam. Small or subtle lesions could be overlooked.  There has been progression of the previously identified deep white matter infarct involving the posterior basal ganglia, posterior limb internal capsule, and centrum semiovale. Cross-sectional measurements today are 10 x 23 mm as compared with 8 x 14 mm previously. This is evident on both the DWI sequences well as T2 and FLAIR images. Tiny focus of subcortical acute  infarction near the posterior frontoparietal junction at the convexity shows progression to subacute status, and has therefore not progressed. No other new infarcts.  Advanced atrophy and chronic microvascular ischemic change. Numerous remote lacunar infarcts. No osseous findings. Extracranial soft tissues unremarkable.  MRA HEAD FINDINGS  Unremarkable internal carotid arteries and basilar artery. Vertebrals codominant. No proximal flow limiting stenosis of the anterior, middle, or posterior cerebral arteries. Within limits of visualization on this motion degraded exam, No cerebellar branch occlusion. No intracranial aneurysm.  IMPRESSION: Slight progression of the previously identified deep white matter infarct involving posterior basal ganglia, posterior limb internal capsule, and centrum semiovale; no hemorrhage. See discussion above.   Electronically Signed   By: Rolla Flatten M.D.   On: 09/27/2013 14:51   Results for orders placed during the hospital encounter of 09/20/13 (from the past 72 hour(s))  GLUCOSE, CAPILLARY     Status: Abnormal   Collection Time    09/25/13  7:10 AM      Result Value Ref Range   Glucose-Capillary 235 (*) 70 - 99 mg/dL   Comment 1 Notify RN    COMPREHENSIVE METABOLIC PANEL     Status: Abnormal   Collection Time    09/25/13  9:25 AM      Result Value Ref Range   Sodium 131 (*) 137 - 147 mEq/L   Potassium 3.8  3.7 - 5.3 mEq/L   Chloride 90 (*) 96 - 112 mEq/L   CO2 26  19 - 32 mEq/L   Glucose, Bld 243 (*) 70 - 99 mg/dL   BUN 13  6 - 23 mg/dL   Creatinine, Ser 0.70  0.50 - 1.35 mg/dL   Calcium 9.8  8.4 - 10.5 mg/dL   Total Protein 7.4  6.0 - 8.3 g/dL   Albumin 3.2 (*) 3.5 - 5.2 g/dL   AST 33  0 - 37 U/L   ALT 61 (*) 0 - 53 U/L   Alkaline Phosphatase 120 (*) 39 - 117 U/L   Total Bilirubin 0.6  0.3 - 1.2 mg/dL   GFR calc non Af Amer >90  >90 mL/min   GFR calc Af Amer >90  >90 mL/min   Comment: (NOTE)     The eGFR has been calculated using the CKD EPI equation.      This calculation has not been validated in all clinical situations.     eGFR's persistently <90 mL/min signify possible Chronic Kidney     Disease.  VALPROIC ACID LEVEL     Status: None   Collection Time    09/25/13  9:25 AM      Result Value Ref Range   Valproic Acid Lvl 55.9  50.0 - 100.0 ug/mL  CBC     Status: Abnormal   Collection Time    09/25/13  9:25 AM      Result Value Ref Range   WBC 16.4 (*) 4.0 - 10.5 K/uL   RBC 4.85  4.22 - 5.81 MIL/uL   Hemoglobin 15.6  13.0 - 17.0 g/dL   HCT 43.2  39.0 - 52.0 %   MCV 89.1  78.0 - 100.0 fL   MCH 32.2  26.0 - 34.0 pg   MCHC 36.1 (*) 30.0 - 36.0 g/dL   RDW 13.0  11.5 - 15.5 %   Platelets 154  150 - 400 K/uL  TSH     Status: None   Collection Time    09/25/13  9:25 AM      Result Value  Ref Range   TSH 4.158  0.350 - 4.500 uIU/mL   Comment: Performed at [facility]     Status: None   Collection Time    09/25/13 10:09 AM      Result Value Ref Range   Specimen Description URINE, CATHETERIZED     Special Requests NONE     Culture  Setup Time       Value: 09/25/2013 10:46     Performed at [facility] Count       Value: >=100,000 COLONIES/ML     Performed at [facility]   Culture       Value: ESCHERICHIA COLI     Performed at [facility]   Report Status 09/27/2013 FINAL     Organism ID, Bacteria ESCHERICHIA COLI    URINALYSIS, ROUTINE W REFLEX MICROSCOPIC     Status: Abnormal   Collection Time    09/25/13 10:09 AM      Result Value Ref Range   Color, Urine AMBER (*) YELLOW   Comment: BIOCHEMICALS MAY BE AFFECTED BY COLOR   APPearance CLOUDY (*) CLEAR   Specific Gravity, Urine 1.034 (*) 1.005 - 1.030   pH 5.5  5.0 - 8.0   Glucose, UA 500 (*) NEGATIVE mg/dL   Hgb urine dipstick MODERATE (*) NEGATIVE   Bilirubin Urine MODERATE (*) NEGATIVE   Ketones, ur >80 (*) NEGATIVE mg/dL   Protein, ur 30 (*) NEGATIVE mg/dL   Urobilinogen, UA 1.0  0.0 - 1.0 mg/dL    Nitrite POSITIVE (*) NEGATIVE   Leukocytes, UA SMALL (*) NEGATIVE  URINE MICROSCOPIC-ADD ON     Status: Abnormal   Collection Time    09/25/13 10:09 AM      Result Value Ref Range   Squamous Epithelial / LPF FEW (*) RARE   WBC, UA 11-20  <3 WBC/hpf   RBC / HPF 3-6  <3 RBC/hpf   Bacteria, UA FEW (*) RARE   Urine-Other MUCOUS PRESENT    GLUCOSE, CAPILLARY     Status: Abnormal   Collection Time    09/25/13 11:41 AM      Result Value Ref Range   Glucose-Capillary 276 (*) 70 - 99 mg/dL   Comment 1 Notify RN    GLUCOSE, CAPILLARY     Status: Abnormal   Collection Time    09/25/13  4:36 PM      Result Value Ref Range   Glucose-Capillary 257 (*) 70 - 99 mg/dL   Comment 1 Notify RN    GLUCOSE, CAPILLARY     Status: Abnormal   Collection Time    09/25/13  9:07 PM      Result Value Ref Range   Glucose-Capillary 238 (*) 70 - 99 mg/dL   Comment 1 Notify RN    CBC     Status: None   Collection Time    09/26/13  5:00 AM      Result Value Ref Range   WBC 7.6  4.0 - 10.5 K/uL   RBC 4.44  4.22 - 5.81 MIL/uL   Hemoglobin 13.9  13.0 - 17.0 g/dL   HCT 39.5  39.0 - 52.0 %   MCV 89.0  78.0 - 100.0 fL   MCH 31.3  26.0 - 34.0 pg   MCHC 35.2  30.0 - 36.0 g/dL   RDW 13.1  11.5 - 15.5 %   Platelets 155  150 - 400 K/uL  BASIC METABOLIC PANEL  Status: Abnormal   Collection Time    09/26/13  5:00 AM      Result Value Ref Range   Sodium 128 (*) 137 - 147 mEq/L   Potassium 3.5 (*) 3.7 - 5.3 mEq/L   Chloride 89 (*) 96 - 112 mEq/L   CO2 27  19 - 32 mEq/L   Glucose, Bld 239 (*) 70 - 99 mg/dL   BUN 18  6 - 23 mg/dL   Creatinine, Ser 0.82  0.50 - 1.35 mg/dL   Calcium 9.2  8.4 - 10.5 mg/dL   GFR calc non Af Amer >90  >90 mL/min   GFR calc Af Amer >90  >90 mL/min   Comment: (NOTE)     The eGFR has been calculated using the CKD EPI equation.     This calculation has not been validated in all clinical situations.     eGFR's persistently <90 mL/min signify possible Chronic Kidney     Disease.   GLUCOSE, CAPILLARY     Status: Abnormal   Collection Time    09/26/13  7:08 AM      Result Value Ref Range   Glucose-Capillary 207 (*) 70 - 99 mg/dL   Comment 1 Notify RN    GLUCOSE, CAPILLARY     Status: Abnormal   Collection Time    09/26/13 11:32 AM      Result Value Ref Range   Glucose-Capillary 228 (*) 70 - 99 mg/dL   Comment 1 Notify RN    GLUCOSE, CAPILLARY     Status: Abnormal   Collection Time    09/26/13  4:48 PM      Result Value Ref Range   Glucose-Capillary 223 (*) 70 - 99 mg/dL   Comment 1 Notify RN    GLUCOSE, CAPILLARY     Status: Abnormal   Collection Time    09/26/13  8:40 PM      Result Value Ref Range   Glucose-Capillary 214 (*) 70 - 99 mg/dL  BASIC METABOLIC PANEL     Status: Abnormal   Collection Time    09/27/13  4:29 AM      Result Value Ref Range   Sodium 134 (*) 137 - 147 mEq/L   Potassium 3.5 (*) 3.7 - 5.3 mEq/L   Chloride 91 (*) 96 - 112 mEq/L   CO2 30  19 - 32 mEq/L   Glucose, Bld 178 (*) 70 - 99 mg/dL   BUN 13  6 - 23 mg/dL   Creatinine, Ser 0.78  0.50 - 1.35 mg/dL   Calcium 8.9  8.4 - 10.5 mg/dL   GFR calc non Af Amer >90  >90 mL/min   GFR calc Af Amer >90  >90 mL/min   Comment: (NOTE)     The eGFR has been calculated using the CKD EPI equation.     This calculation has not been validated in all clinical situations.     eGFR's persistently <90 mL/min signify possible Chronic Kidney     Disease.  GLUCOSE, CAPILLARY     Status: Abnormal   Collection Time    09/27/13  7:26 AM      Result Value Ref Range   Glucose-Capillary 158 (*) 70 - 99 mg/dL   Comment 1 Notify RN    GLUCOSE, CAPILLARY     Status: Abnormal   Collection Time    09/27/13 12:05 PM      Result Value Ref Range   Glucose-Capillary 210 (*) 70 - 99 mg/dL  Comment 1 Notify RN    GLUCOSE, CAPILLARY     Status: Abnormal   Collection Time    09/27/13  4:35 PM      Result Value Ref Range   Glucose-Capillary 207 (*) 70 - 99 mg/dL   Comment 1 Notify RN    GLUCOSE,  CAPILLARY     Status: Abnormal   Collection Time    09/27/13  8:32 PM      Result Value Ref Range   Glucose-Capillary 174 (*) 70 - 99 mg/dL       General: No acute distress Mood and affect are appropriate Heart: Regular rate and rhythm no rubs murmurs or extra sounds Lungs: Clear to auscultation, breathing unlabored, no rales or wheezes, poor inspiratory effort Abdomen: Positive bowel sounds, soft nontender to palpation, nondistended Extremities: No clubbing, cyanosis, or edema Skin: No evidence of breakdown, no evidence of rash Neurologic: pt is more arousable. Could tell me he was at [facility]. Voice clearer. Cranial nerves II through XII intact, motor strength is 5/5 in left deltoid, bicep, tricep, grip, hip flexor, knee extensors, ankle dorsiflexor and plantar flexor, 3- RUE, 4- R KE, 1/5 R ankle Sensory exam Cerebellar exam normal finger to nose to finger as well as heel to shin in bilateral upper and lower extremities Musculoskeletal: Full range of motion in all 4 extremities. No joint swelling  Assessment/Plan: 1. Functional deficits secondary to embolic left internal capsule/deep white matter infarct   which require 3+ hours per day of interdisciplinary therapy in a comprehensive inpatient rehab setting. Physiatrist is providing close team supervision and 24 hour management of active medical problems listed below. Physiatrist and rehab team continue to assess barriers to discharge/monitor patient progress toward functional and medical goals. FIM: FIM - Bathing Bathing Steps Patient Completed: Chest;Left Arm;Abdomen;Front perineal area;Right upper leg;Left upper leg;Right lower leg (including foot);Left lower leg (including foot) Bathing: 4: Min-Patient completes 8-9 [id number] parts or 75+ percent  FIM - Upper Body Dressing/Undressing Upper body dressing/undressing steps patient completed: Thread/unthread left sleeve of pullover shirt/dress Upper body dressing/undressing: 0: Wears  gown/pajamas-no public clothing FIM - Lower Body Dressing/Undressing Lower body dressing/undressing steps patient completed: Don/Doff left sock;Don/Doff right sock (Pt doffed gripper socks using the reacher) Lower body dressing/undressing: 2: Max-Patient completed 25-49% of tasks  FIM - Toileting Toileting steps completed by patient: Adjust clothing prior to toileting Toileting Assistive Devices: Grab bar or rail for support Toileting: 2: Max-Patient completed 1 of 3 steps  FIM - TRadio producerDevices: Elevated toilet seat;Grab bars Toilet Transfers: 1-Two helpers  FIM - BControl and instrumentation engineerDevices: Arm rests Bed/Chair Transfer: 4: Supine > Sit: Min A (steadying Pt. > 75%/lift 1 leg);4: Bed > Chair or W/C: Min A (steadying Pt. > 75%)  FIM - Locomotion: Wheelchair Distance: 10 Locomotion: Wheelchair: 0: Activity did not occur FIM - Locomotion: Ambulation Locomotion: Ambulation Assistive Devices: CNurse, adult(and no AD) Ambulation/Gait Assistance: 3: Mod assist Locomotion: Ambulation: 0: Activity did not occur  Comprehension Comprehension Mode: Auditory Comprehension: 5-Follows basic conversation/direction: With extra time/assistive device  Expression Expression Mode: Verbal Expression: 3-Expresses basic 50 - 74% of the time/requires cueing 25 - 50% of the time. Needs to repeat parts of sentences.  Social Interaction Social Interaction: 5-Interacts appropriately 90% of the time - Needs monitoring or encouragement for participation or interaction.  Problem Solving Problem Solving: 5-Solves basic problems: With no assist  Memory Memory: 5-Recognizes or recalls 90% of the time/requires cueing <  10% of the time  Medical Problem List and Plan:  1. Left internal capsule/deep white matter infarcts felt to be embolic secondary to unknown source  2. DVT Prophylaxis/Anticoagulation: Subcutaneous Lovenox. Monitor platelet  counts and any signs of bleeding  3. Pain Management: Tylenol as needed  4. Mood/PTSD/depression. Wellbutrin 150 mg daily, BuSpar 5 mg twice a day as needed, Depakote 1000 mg each bedtime, effexor 150 mg once a day and trazodone 300 mg each bedtime -consult to neuropsych 5. Neuropsych: This patient is not yet capable of making decisions on his own behalf.  6. Diabetes mellitus with peripheral neuropathy.uncontrolled titrating Lantus --added am dose   Hemoglobin A1c 7.4. Currently on sliding scale insulin.   Check blood sugars a.c. and at bedtime resume oral agents as directed   -still receiving much less insulin than home dose goal is >110,<180 7. Hypertension. Lisinopril 10 mg day. Monitor with increased activity  8. Hypothyroidism. Synthroid .TSH 4.045  9. Hyperlipidemia. Lipitor  10. GERD. Protonix  11. Left lateral lower extremity ulcer. Followup wound care nurse with dressing changes as directed/Unna boot 12. Lethargy: multifactorial, the MRI findings may represent normal evolution of L PLIC infarct, UTI being treated with resolution of neutrophilia and fever, hypothyroid on supplements  .  LOS (Days) 8 A FACE TO FACE EVALUATION WAS PERFORMED  Curby Carswell E 09/28/2013, 6:35 AM

## 2013-09-28 NOTE — Progress Notes (Signed)
Social Work Patient ID: Brad Taylor, male   DOB: 06-24-1951, 63 y.o.   MRN: [id number] Met with [facility] who reports [facility].  She gave me [facility] [place]. Have left a message for her to inform her pt is here on rehab and targeted discharge date of 3/3.  Have let wife know about this and will await LaDon returned call. Work toward discharge, aware team conference tomorrow.

## 2013-09-28 NOTE — Progress Notes (Signed)
Occupational Therapy Session Note  Patient Details  Name: Brad Taylor MRN: [id number] Date of Birth: 01-08-1951  Today's Date: 09/28/2013 Time: 1700-1749 Time Calculation (min): 30 min  Skilled Therapeutic Interventions/Progress Updates:    Pt found supine in bed to start session.  Pt transferred to wheelchair with min assist stand pivot.  Rolled him down to the gym and transferred to the therapy mat with min assist.  Worked on LUE neuromuscular re-education using the tilted stool.  Had pt work on active right shoulder flexion and elbow extension during session.  Pt able to elicit slight shoulder and elbow movement for repetitions with mod facilitation in the trunk to keep from bending forward.  Pt also noted with gross finger flexion of approximately 40% as well.    Therapy Documentation Precautions:  Precautions Precautions: Fall Precaution Comments: R sided weakness (RUE>RLE), dysarthria Restrictions Weight Bearing Restrictions: No  Pain: Pain Assessment Pain Assessment: No/denies pain ADL: See FIM for current functional status  Therapy/Group: Individual Therapy  Agam Tuohy OTR/L 09/28/2013, 4:02 PM

## 2013-09-28 NOTE — Progress Notes (Signed)
Physical Therapy Weekly Progress Note  Patient Details  Name: Brad Taylor MRN: [id number] Date of Birth: 12/25/1950  Today's Date: 09/28/2013  Patient has met 2 of 4 short term goals.  Pt with recent extension of CVA resulting in increased weakness of RUE, RLE, increased balance deficits, decreased attention, decreased memory, decreased initiation and delayed processing.  Pt is able to transfer via stand pivot at min assist level, however requires max cues for safety and controlled descent when sitting.  He also is able to ambulate with or without a device at min to moderate assist (mod assist without AD), however pt has increased difficulty sequencing use of AD, therefore will continue to address.  Pt seems unaware of deficits this morning, as he states during session, "I just want to go home tomorrow, I can do what I need to there."  Discussed in length with pt and wife that he is not safe to return home yet and that he would greatly benefit from increased time here.    Patient continues to demonstrate the following deficits: decreased functional use of RUE, decreased strength in RLE, decreased standing balance, decreased attention, decreased awareness, decreased safety, decreased memory and therefore will continue to benefit from skilled PT intervention to enhance overall performance with activity tolerance, balance, postural control, ability to compensate for deficits, functional use of  right upper extremity and right lower extremity, attention, awareness, coordination and knowledge of precautions.  Patient progressing toward long term goals..  Continue plan of care.  May need to downgrade ambulation and stair goals to min assist if pt does not progress during the week.    PT Short Term Goals Week 1:  PT Short Term Goal 1 (Week 1): Pt will perform stand pivot transfers with LRAD at min assist level PT Short Term Goal 1 - Progress (Week 1): Met PT Short Term Goal 2 (Week 1): Pt will perform  standing balance activities at min assist level PT Short Term Goal 2 - Progress (Week 1): Progressing toward goal (not consistent since extension of CVA) PT Short Term Goal 3 (Week 1): Pt will self propel w/c using L hemi technique at supervision level x 100' PT Short Term Goal 3 - Progress (Week 1): Met PT Short Term Goal 4 (Week 1): Pt will ambulate x 100' with LRAD at min assist level.  PT Short Term Goal 4 - Progress (Week 1): Progressing toward goal Week 2:  PT Short Term Goal 1 (Week 2): =LTG's due to ELOS  Skilled Therapeutic Interventions/Progress Updates:   See previous notes  Therapy Documentation Precautions:  Precautions Precautions: Fall Precaution Comments: R sided weakness (RUE>RLE), dysarthria Restrictions Weight Bearing Restrictions: No General: Amount of Missed PT Time (min): 10 Minutes Missed Time Reason: Other (comment) (therapist late)   Pain: Pain Assessment Pain Assessment: No/denies pain Pain Score: 0-No pain   Locomotion : Ambulation Ambulation/Gait Assistance: 3: Mod assist   See FIM for current functional status  Therapy/Group: Individual Therapy  Parcell, Emily Ann 09/28/2013, 10:46 AM   

## 2013-09-28 NOTE — Progress Notes (Signed)
Stroke Team Progress Note  HISTORY Brad Taylor is a 63 y.o. male who awakened 09/15/2013 and was not feeling well. His blood sugar was elevated at 180 and he decided to go back to sleep. When he awakened later he noted that he was stumbling as he tried to get to the bathroom so after using the bathroom he went back to bed. When he got out of bed later he was having some slurred speech and was complaining that his right arm did not feel right. He thought that he slept on it wrong. When his son came home at 5:30 he noted that his face was drooping, speech was very slurred and he could not use his right arm well. EMS was called at that time and the patient was brought in as a code stroke. He was not a TPA candidate as he was outside the window.  He was evaluated in the hospital by the stroke team. He was felt to have embolic strokes of unknown etiology. He was discharged to inpatient rehabilitation.  Patient has had waxing and waning of current stroke symptoms on rehabilitation, with neuro worsening yesterday. MRI was ordered. We have been asked to follow.   SUBJECTIVE Patient currently working with occupational therapist in his room. While he feels his deficits are slightly worse, he does not feel they are significantly worse.  OBJECTIVE Most recent Vital Signs: Filed Vitals:   09/26/13 1534 09/27/13 0616 09/27/13 1500 09/28/13 0505  BP: 116/71 144/90 116/74 148/90  Pulse:  83 74 73  Temp: 97.4 F (36.3 C) 97.8 F (36.6 C) 97.7 F (36.5 C) 98.5 F (36.9 C)  TempSrc: Oral Oral Oral Oral  Resp:  19 18 18   Weight:      SpO2:  94% 95% 94%   CBG (last 3)   Recent Labs  09/27/13 1635 09/27/13 2032 09/28/13 0742  GLUCAP 207* 174* 152*    IV Fluid Intake:   . 0.9 % NaCl with KCl 20 mEq / L 75 mL/hr at 09/28/13 0749    MEDICATIONS  . atorvastatin  80 mg Oral q1800  . buPROPion  150 mg Oral Daily  . clopidogrel  75 mg Oral Q breakfast  . divalproex  1,000 mg Oral QHS  .  enoxaparin (LOVENOX) injection  40 mg Subcutaneous Q24H  . insulin aspart  0-15 Units Subcutaneous TID WC  . insulin glargine  15 Units Subcutaneous Daily  . insulin glargine  30 Units Subcutaneous QHS  . levothyroxine  125 mcg Oral QAC breakfast  . lisinopril  10 mg Oral Daily  . RABEprazole  40 mg Oral QAC breakfast  . sulfamethoxazole-trimethoprim  1 tablet Oral Q12H  . traZODone  300 mg Oral QHS  . venlafaxine XR  150 mg Oral Q breakfast   PRN:  acetaminophen, busPIRone, loperamide, ondansetron (ZOFRAN) IV, ondansetron, sorbitol  Diet:  Carb Control  Activity:  Up with assistance DVT Prophylaxis:  Lovenox  CLINICALLY SIGNIFICANT STUDIES Basic Metabolic Panel:   Recent Labs Lab 09/26/13 0500 09/27/13 0429  NA 128* 134*  K 3.5* 3.5*  CL 89* 91*  CO2 27 30  GLUCOSE 239* 178*  BUN 18 13  CREATININE 0.82 0.78  CALCIUM 9.2 8.9   Liver Function Tests:   Recent Labs Lab 09/25/13 0925  AST 33  ALT 61*  ALKPHOS 120*  BILITOT 0.6  PROT 7.4  ALBUMIN 3.2*   CBC:   Recent Labs Lab 09/25/13 0925 09/26/13 0500  WBC 16.4* 7.6  HGB  15.6 13.9  HCT 43.2 39.5  MCV 89.1 89.0  PLT 154 155   Coagulation:  No results found for this basename: LABPROT, INR,  in the last 168 hours Cardiac Enzymes:   Recent Labs Lab 09/23/13 1512 09/23/13 2216 09/24/13 0550  CKTOTAL 116  --   --   CKMB 1.4  --   --   TROPONINI <0.30 <0.30 <0.30   Urinalysis:   Recent Labs Lab 09/25/13 1009  COLORURINE AMBER*  LABSPEC 1.034*  PHURINE 5.5  GLUCOSEU 500*  HGBUR MODERATE*  BILIRUBINUR MODERATE*  KETONESUR >80*  PROTEINUR 30*  UROBILINOGEN 1.0  NITRITE POSITIVE*  LEUKOCYTESUR SMALL*   Lipid Panel    Component Value Date/Time   CHOL 216* 09/16/2013 0415   TRIG 288* 09/16/2013 0415   HDL 35* 09/16/2013 0415   CHOLHDL 6.2 09/16/2013 0415   VLDL 58* 09/16/2013 0415   LDLCALC 123* 09/16/2013 0415   HgbA1C  Lab Results  Component Value Date   HGBA1C 7.4* 09/16/2013    Urine  Drug Screen:     Component Value Date/Time   LABOPIA NONE DETECTED 09/16/2013 0642   COCAINSCRNUR NONE DETECTED 09/16/2013 0642   LABBENZ NONE DETECTED 09/16/2013 0642   AMPHETMU NONE DETECTED 09/16/2013 0642   THCU NONE DETECTED 09/16/2013 0642   LABBARB NONE DETECTED 09/16/2013 0642    Alcohol Level:  No results found for this basename: ETH,  in the last 168 hours  Ct Head Wo Contrast 09/15/2013    No focal acute intracranial abnormality identified. Chronic diffuse atrophy. Chronic bilateral periventricular white matter small vessel ischemic change.  MRI HEAD:   09/27/2013  Slight progression of the previously identified deep white matter  infarct involving posterior basal ganglia, posterior limb internal  capsule, and centrum semiovale; no hemorrhage. 09/17/2013   1. Acute ischemic infarct involving the posterior limb of the left internal capsule. 2. Additional subcentimeter ischemic infarct within the deep white matter of the left centrum semi ovale. 3. Remote subcentimeter lacunar infarcts involving the bilateral basal ganglia bilaterally. 4. Moderate cerebral atrophy and chronic microvascular ischemic changes.    MRA HEAD:   09/27/2013 unremarkable. No change since last study.  09/17/2013 1. Mild multi focal irregularity within the cavernous ICAs bilaterally, left greater than right, likely related to underlying atherosclerotic disease. No high-grade flow-limiting stenosis or proximal branch arterial occlusion identified. 2. No intracranial aneurysm.     Head CT scan is reviewed in person and shows hypodensity oval-shaped involving the posterior limb of the internal capsule on the left side. No hemorrhages noted. 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  Dg Chest Port 1 View 09/15/2013    Negative except for mild cardiac enlargement      2D Echocardiogram  ejection fraction 60-65%. No cardiac source of emboli was identified.  Carotid Doppler  Preliminary report: 1-39% ICA stenosis. Vertebral artery flow is antegrade.  EKG sinus bradycardia rate 56 beats per minute. For complete results please see formal report.    Physical Exam   Neurologic Examination:  Mental Status:  Alert, oriented, thought content appropriate. Speech nonfluent. At times words can not be distinguished. Speech is slurred. Speech is overall better today with less dysarthria. Able to follow 3 step commands without difficulty.  Cranial Nerves:  II: Discs flat bilaterally; Visual fields grossly normal, pupils equal, round, reactive to light and accommodation  III,IV, VI: ptosis not present, extra-ocular motions intact bilaterally  V,VII: mild right facial droop, facial light touch sensation normal bilaterally  VIII: hearing normal bilaterally  IX,X: gag reflex present  XI: bilateral shoulder shrug  XII: midline tongue extension  Motor:  Right : Upper extremity 0/5 R - Arm, R Hand 0/5. Left: Upper extremity 5/5  Lower extremity 5/5 Lower extremity 5/5  Tone and bulk:normal tone throughout; no atrophy noted  Sensory: Pinprick and light touch decreased on the RUE  Deep Tendon Reflexes: 2+ and symmetric with absent AJ's bilaterally  Plantars:  Right: downgoing Left: downgoing  Cerebellar:  normal finger-to-nose and normal heel-to-shin test. Difficult to perform on the right secondary to weakness  Gait: Unable to test safely  CV: pulses palpable throughout    ASSESSMENT Mr. Brad Taylor is a 63 y.o. male initial presentation with right upper extremity weakness and speech difficulties. Initial imaging confirmed left posterior limb internal capsule infarct as well as small left centrum semi ovale infarct. Imaging done on rehabilitation with neuro- worsening showed expected evolution, no significant new infarcts. All infarcts are felt to be thrombotic secondary to small vessel disease. Embolic source not suspected. He was on aspirin 81 mg orally every day prior  to this admission, now on clopidogrel 75 mg orally every day for secondary stroke prevention.   Patient with reported neurologic worsening, waxing and waning of . Repeat imaging shows no significant change. Waxing and waning of stroke symptoms is often seen in patients with small vessel disease.   Hyperlipidemia   Diabetes mellitus   Hypertension  Hospital day # 8  TREATMENT/PLAN  Continue clopidogrel 75 mg orally every day for secondary stroke prevention.  Continue rehabilitation   Nothing new to have from the stroke standpoint, we'll sign off, call for questions.  Ongoing risk factor control  SHARON BIBY, MSN, RN, ANVP-BC, ANP-BC, GNP-BC [facility] Pager: [phone] 09/28/2013 3:47 PM  I have personally obtained a history, examined the patient, evaluated imaging results, and formulated the assessment and plan of care. I agree with the above. Antony Contras, MD

## 2013-09-28 NOTE — Progress Notes (Signed)
Occupational Therapy Session Note  Patient Details  Name: Brad Taylor MRN: [id number] Date of Birth: 04-22-1951  Today's Date: 09/28/2013 Time: 0803-0903 Time Calculation (min): 60 min  Short Term Goals: Week 1:  OT Short Term Goal 1 (Week 1): Pt will perform all bathing with min assist using AE for sit to stand.   OT Short Term Goal 2 (Week 1): Pt will donn pullover shirt with min assist following hemi technques. OT Short Term Goal 3 (Week 1): Pt will donn pants and shoes with AE and min assist. OT Short Term Goal 4 (Week 1): Pt will perform toilet transfers with supervision. OT Short Term Goal 5 (Week 1): Pt/family will return demonstrate AAROM/PROM exercises for the RUE.  Skilled Therapeutic Interventions/Progress Updates:    Pt worked on bathing and dressing during session.  Transferred to the wheelchair from the bed with min assist level and also to the toilet later in the session with min assist as well.  Needs mod instructional cueing to sequence bathing tasks with total assist to integrate the RUE into washing the left arm and stabilizing the washcloth to apply soap.  Pt still unable to perform peri washing secondary to limitations in the LUE also.  Utilized reacher for doffing gripper socks but needed mod assist to complete this morning.  Pt still with significant dysarthria with speech.  Therapist assisted with toilet hygiene and clothing management during session as well and assisted pt with LB dressing secondary to decreased time at end of session.    Therapy Documentation Precautions:  Precautions Precautions: Fall Precaution Comments: R sided weakness (RUE>RLE), dysarthria Restrictions Weight Bearing Restrictions: No  Pain: Pain Assessment Pain Assessment: No/denies pain Pain Score: 0-No pain ADL: See FIM for current functional status  Therapy/Group: Individual Therapy  Kindred Reidinger OTR/L 09/28/2013, 12:13 PM

## 2013-09-28 NOTE — Progress Notes (Signed)
Speech Language Pathology Daily Session Note  Patient Details  Name: Brad Taylor MRN: [id number] Date of Birth: [birth year]  Today's Date: 09/28/2013 Time: 2376-2831 Time Calculation (min): 45 min  Short Term Goals: Week 1: SLP Short Term Goal 1 (Week 1): Patient will utilize speech intelligibility strategies at the sentnece level with Min vebral cues to self-monitor and correct errors. SLP Short Term Goal 2 (Week 1): Patient will utilize word finding strategies in conversation with Min verbal cues. SLP Short Term Goal 3 (Week 1): Patient will demonstrate problem solving with daily complex problems with Min verbal cues to self-monitor and correct errors.  Skilled Therapeutic Interventions: Skilled treatment session focused on addressing cognitive-linguistic goals. Patient was received in wheelchair and very sleepy.  He reported that therapy had worked him hard this morning.  SLP facilitated session with new learning task; patient required Max faded to Min cues to problem solve completion of task.  Working memory appeared to impact function as well and repetition appeared to facilitate increased recall and in turn ability to problem solve through completion of task.  Patient required Mod cues increased to Max cues with fatigue to utilize speech intelligibility strategies.  Due to level of fatigue at end of session SLP educated patient, wife and RN regarding recommendations to hold lunch until after patient could rest to maximize safety.  SLP also added full supervision with PO due to low level of arousal at times.     FIM:  Comprehension Comprehension Mode: Auditory Comprehension: 4-Understands basic 75 - 89% of the time/requires cueing 10 - 24% of the time Expression Expression Mode: Verbal Expression: 3-Expresses basic 50 - 74% of the time/requires cueing 25 - 50% of the time. Needs to repeat parts of sentences. Social Interaction Social Interaction: 4-Interacts appropriately 75 - 89% of  the time - Needs redirection for appropriate language or to initiate interaction. Problem Solving Problem Solving: 3-Solves basic 50 - 74% of the time/requires cueing 25 - 49% of the time Memory Memory: 3-Recognizes or recalls 50 - 74% of the time/requires cueing 25 - 49% of the time FIM - Eating Eating Activity: 5: Set-up assist for open containers  Pain Pain Assessment Pain Assessment: No/denies pain  Therapy/Group: Individual Therapy  Carmelia Roller., CCC-SLP [phone]  [place] 09/28/2013, 4:57 PM

## 2013-09-29 ENCOUNTER — Inpatient Hospital Stay (HOSPITAL_COMMUNITY): Payer: Medicare HMO | Admitting: Rehabilitation

## 2013-09-29 ENCOUNTER — Encounter (HOSPITAL_COMMUNITY): Payer: Medicare HMO | Admitting: Occupational Therapy

## 2013-09-29 ENCOUNTER — Inpatient Hospital Stay (HOSPITAL_COMMUNITY): Payer: Medicare HMO | Admitting: Physical Therapy

## 2013-09-29 ENCOUNTER — Inpatient Hospital Stay (HOSPITAL_COMMUNITY): Payer: Medicare HMO | Admitting: Occupational Therapy

## 2013-09-29 ENCOUNTER — Inpatient Hospital Stay (HOSPITAL_COMMUNITY): Payer: Medicare HMO | Admitting: Speech Pathology

## 2013-09-29 LAB — BASIC METABOLIC PANEL
BUN: 10 mg/dL (ref 6–23)
CO2: 31 meq/L (ref 19–32)
Calcium: 9 mg/dL (ref 8.4–10.5)
Chloride: 95 mEq/L — ABNORMAL LOW (ref 96–112)
Creatinine, Ser: 0.7 mg/dL (ref 0.50–1.35)
GFR calc Af Amer: >90 mL/min (ref >90)
GLUCOSE: 192 mg/dL — AB (ref 70–99)
POTASSIUM: 3.7 meq/L (ref 3.7–5.3)
SODIUM: 137 meq/L (ref 137–147)

## 2013-09-29 LAB — GLUCOSE, CAPILLARY
GLUCOSE-CAPILLARY: 188 mg/dL — AB (ref 70–99)
GLUCOSE-CAPILLARY: 241 mg/dL — AB (ref 70–99)
Glucose-Capillary: 165 mg/dL — ABNORMAL HIGH (ref 70–99)
Glucose-Capillary: 205 mg/dL — ABNORMAL HIGH (ref 70–99)

## 2013-09-29 MED ORDER — INSULIN GLARGINE 100 UNIT/ML ~~LOC~~ SOLN
20.0000 [IU] | Freq: Every day | SUBCUTANEOUS | Status: DC
  Administered 2013-09-29 – 2013-09-30 (×2): 20 [IU] via SUBCUTANEOUS
  Filled 2013-09-29 (×2): qty 0.2

## 2013-09-29 MED ORDER — SODIUM CHLORIDE 0.45 % IV SOLN
INTRAVENOUS | Status: DC
  Administered 2013-09-29: 18:00:00 via INTRAVENOUS
  Administered 2013-09-30: 900 mL via INTRAVENOUS
  Administered 2013-10-01 – 2013-10-02 (×2): via INTRAVENOUS

## 2013-09-29 NOTE — Progress Notes (Signed)
Physical Therapy Session Note  Patient Details  Name: Brad Taylor MRN: [id number] Date of Birth: 1951-02-09  Today's Date: 09/29/2013 Time: 0830-0850 Time Calculation (min): 20 min  Short Term Goals: Week 2:  PT Short Term Goal 1 (Week 2): =LTG's due to ELOS  Skilled Therapeutic Interventions/Progress Updates:   Pt received lying in bed, having just started to eat breakfast.  Pt agreeable to getting up into w/c in order to finish breakfast this morning.  Performed supine to L SL at supervision, however requires total assist cues to begin bed mobility in this way, and also to reach for RUE with LUE prior to rolling. Once in SL, pt able to bring LEs out of bed and elevate trunk into sitting on his own today with mod cues for technique.  Once at EOB, set w/c up at doorway in order to trial gait with use of RW with R UE hand splint.  Pt was able to ambulate to doorway (approx 12') with min/guard assist with mod cues for keeping RW close to him, esp when backing up to sit down.  Provided cues for controlled sitting and reaching back, however pt "flopped" in chair.  Had pt stand again with min/guard assist and reach back and control descent to chair.  Pt left in chair with quick release belt donned, breakfast placed in front of pt and notified RN and nurse tech that he would need supervision to finish breakfast this morning.  Notified pt of schedule change and would add 10 mins to afternoon session to make up for 10 mins missing in this session.  Pt in agreement.  All needs in reach.   Therapy Documentation Precautions:  Precautions Precautions: Fall Precaution Comments: R sided weakness (RUE>RLE), dysarthria Restrictions Weight Bearing Restrictions: No General: Amount of Missed PT Time (min): 10 Minutes Missed Time Reason: Other (comment) (pt finishing breakfast) Vital Signs: Therapy Vitals Temp: 98.7 F (37.1 C) Temp src: Oral Pulse Rate: 64 Resp: 16 BP: 131/76 mmHg Oxygen  Therapy SpO2: 93 % O2 Device: None (Room air) Pain:Pt with no c/o pain this morning.    Locomotion : Ambulation Ambulation/Gait Assistance: 4: Min guard   See FIM for current functional status  Therapy/Group: Individual Therapy  Denice Bors 09/29/2013, 8:57 AM

## 2013-09-29 NOTE — Patient Care Conference (Signed)
Inpatient RehabilitationTeam Conference and Plan of Care Update Date: 09/29/2013   Time: 11:45 AM    Patient Name: Brad Taylor      Medical Record Number: [id number]  Date of Birth: 1951/08/04 Sex: Male         Room/Bed: 4M05C/4M05C-01 Payor Info: Payor: [employer] MEDICARE / Plan: [employer] MEDICARE HMO / Product Type: *No Product type* /    Admitting Diagnosis: CVA  Admit Date/Time:  09/20/2013  4:46 PM Admission Comments: No comment available   Primary Diagnosis:  <principal problem not specified> Principal Problem: <principal problem not specified>  Patient Active Problem List   Diagnosis Date Noted  . CVA (cerebral infarction) 09/20/2013  . Stroke 09/15/2013    Expected Discharge Date: Expected Discharge Date: 10/12/13  Team Members Present: Physician leading conference: Dr. Alysia Penna Social Worker Present: Alfonse Alpers, LCSW;Becky Kristine Chahal, LCSW Nurse Present: Nanine Means, RN PT Present: Raylene Everts, PT;Emily Rinaldo Cloud, PT OT Present: Simonne Come, OT;Kris Gellert, Maryella Shivers, OT SLP Present: Gunnar Fusi, SLP PPS Coordinator present : Ileana Ladd, Lelan Pons, RN, CRRN     Current Status/Progress Goal Weekly Team Focus  Medical   Fluctuating deficits, poor per os intake although improved yesterday, history of chronic depression and anxiety  Improve motivation for therapy  Neuropsych, met adjustment   Bowel/Bladder   Patient incontinent of bowel and bladder; using condom cath  Continent of bowel and bladder  Timed toileting q 2-3 hrs   Swallow/Nutrition/ Hydration   regular and thin with full supervision due to decreased level of arousal   least restrictive PO intake with intermittent supervision   increase arousal and ability to sustain attention   ADL's   Pt is min assist for UB selfcare and min to mod for UB dressing, min asslst for LB bathing and mod assist for dressing.  Pt needs min assist for functional transfers stand pivot as well.  He does  exhibit some decreased cognitive prcessing during functional tasks.  Brunnstrum stage II in the right arm and hand.  overall supervision for transfers, toilet hygiene, UB dressing, min assist for UB bathing and LB dressing  Selfcare retraining, LUE coordination and strengthening, neuro re-education, sitting and standing balance.   Mobility   Pt requires mod assist for bed mobility, min/mod assist for standing, stand pivot transfers and mostly mod assist for gait with or without a device.  Note demonstrates decreased cognitive processing during sessions with decreased attention, decreased memory, decreased initiation and delayed processing of verbal commands.    Supervision overall  NMR for RUE/LE, gait, balance, coordination, safety, pt/family education.    Communication   Mod assist   Supervision   increase arousal and sustained attention as well as use of intelligibility strategies    Safety/Cognition/ Behavioral Observations  Mod-Max assist   Supervision with complex  increase arousal and sustained attention to maximize functional abilities   Pain   No complaints of pain  < 3  Assess and treat for pain q shift and prn   Skin   BLE discoloration; diabetic ulcer to LLE- wife does wound care  No skin breakdown or infection noted during rehab stay  Assess skin q shift and prn      *See Care Plan and progress notes for long and short-term goals.  Barriers to Discharge: See above    Possible Resolutions to Barriers:  Continue rehabilitation    Discharge Planning/Teaching Needs:  Home with wife who can provide assist-here dialy and particiaptes in his care  Team Discussion:  Balance worse and processing slower due to extension of CVA.  IV fluids in pm only.  UTI treated.  Wife here and participates in his therapies.  Revisions to Treatment Plan:  Extended stay due to CVA extension   Continued Need for Acute Rehabilitation Level of Care: The patient requires daily medical management  by a physician with specialized training in physical medicine and rehabilitation for the following conditions: Daily direction of a multidisciplinary physical rehabilitation program to ensure safe treatment while eliciting the highest outcome that is of practical value to the patient.: Yes Daily medical management of patient stability for increased activity during participation in an intensive rehabilitation regime.: Yes Daily analysis of laboratory values and/or radiology reports with any subsequent need for medication adjustment of medical intervention for : Neurological problems;Other  Yoshi Vicencio, Gardiner Rhyme 09/29/2013, 2:22 PM

## 2013-09-29 NOTE — Progress Notes (Signed)
Social Work Patient ID: Brad Taylor, male   DOB: 23-May-1951, 63 y.o.   MRN: [id number] Met with pt and wife to inform of team conference progression toward goals and how need to extend due to extension of his stroke. He is processing slower and balance is worse now.  He would like to stay a little shorter than 3/10, but will await progress.  Wife is very supportive and Wants pt to stay here until 3/10.  Work toward discharge 3/10.

## 2013-09-29 NOTE — Progress Notes (Signed)
Physical Therapy Session Note  Patient Details  Name: Brad Taylor MRN: [id number] Date of Birth: September 23, 1950  Today's Date: 09/29/2013 Time: 4128-7867 Time Calculation (min): 38 min  Short Term Goals: Week 2:  PT Short Term Goal 1 (Week 2): =LTG's due to ELOS  Skilled Therapeutic Interventions/Progress Updates:   Pt received sitting on therapy map in gym, having just finished with OT session.  Performed 62' gait x 2 reps with use of RW with R hand splint at min assist.  Pt continues to demonstrate decreased glute/quad activation on RLE during R stance and also demonstrates decreased clearance of RLE at times, esp when fatigued.  Provided min tactile cues for increased weight shift to the L in order to fully clear RLE.  On second rep of ambulation, provided him with R allard AFO to provide DF assist during R swing phase of gait.  Note clearance did improve somewhat, however when fatigued or when increasing gait speed, note he continues to drag foot.  Once seated, pt states he feels as though he needs to use restroom to have a bowel movement.  Pt ambulated approx 10' to/from restroom with same assist and cues mentioned above, however was unsuccessful in having bowel movement.  Pt able to adjust clothing on L side, however requires assist for R side.  Pt returned to chair and for remainder of session focused on sit <> stand with equal WB and with increased forward weight shift as pt tends to use back of legs in chair to stabilize himself in standing.  Also provided cues when sitting to maintain equal weight bearing and keep head/chest up while sitting.  Performed x 4 reps at min assist.  Pt left in room in w/c with half lap tray and all needs in reach.  Family in room, therefore did not don quick release belt.    Therapy Documentation Precautions:  Precautions Precautions: Fall Precaution Comments: R sided weakness (RUE>RLE), dysarthria Restrictions Weight Bearing Restrictions: No   Pain: Pain  Assessment Pain Assessment: No/denies pain   Locomotion : Ambulation Ambulation/Gait Assistance: 4: Min assist   See FIM for current functional status  Therapy/Group: Individual Therapy  Denice Bors 09/29/2013, 3:53 PM

## 2013-09-29 NOTE — Plan of Care (Signed)
Problem: RH KNOWLEDGE DEFICIT Goal: RH STG INCREASE KNOWLEDGE OF DIABETES Patient/caregiver will be able to verbalize and demonstrate insulin administration. As well as strategies to manage diabetes at home.( diet, medications, monitoring blood glucose, routine follow up with MD) Outcome: Progressing Able to give injection after drawn up with max verbal cueing, per wife she draws insulin up

## 2013-09-29 NOTE — Plan of Care (Signed)
Problem: RH BLADDER ELIMINATION Goal: RH STG MANAGE BLADDER WITH ASSISTANCE STG Manage Bladder With Minimal Assistance  Outcome: Not Progressing No awareness of being incontinent at this time, condom cath in place- managed by staff, need to start toileting every 3 hours and prn for anticipated discharge to home, assist with urinal or up to [facility]

## 2013-09-29 NOTE — Progress Notes (Signed)
Occupational Therapy Weekly Progress Note  Patient Details  Name: Brad Taylor MRN: [id number] Date of Birth: 09-Aug-1950  Today's Date: 09/29/2013 Time: 8118-8677 Time Calculation (min): 46 min  Patient has met 1 of 5 short term goals.  Mr. Valbuena continues to demonstrate RUE hemiparesis with mild RLE hemiparesis.  MRI confirmed extension of his previous CVA which has now further impaired his speech, balance, and cognitive processing.  Pt is currently min assist for UB bathing with mod assist for LB bathing and dressing.  He continues to need min assist for stand pivot transfers with greater lean to the right as well during toileting tasks.  He continues to need mod instructional cueing to sequence bathing and at times will pause secondary to not being able to initiate the next step in the task. Have been attempting functional use of AE to help assist with LB selfcare.  He continues to need min assist with mod instructional cueing to use the reacher however.  Feel he will still be appropriate for home discharge with his wife but may need a slight extension in his LOS to reach overall supervision level goals.     Patient continues to demonstrate the following deficits: decreased balance, decreased initiation, decreased RUE and RLE functional use and therefore will continue to benefit from skilled OT intervention to enhance overall performance with BADL.  Patient progressing toward long term goals..  Continue plan of care.  OT Short Term Goals Week 2:  OT Short Term Goal 1 (Week 2): Pt will perform all bathing with min assist using AE for sit to stand.   OT Short Term Goal 2 (Week 2): Pt will perform all bathing with min assist using AE for sit to stand.   OT Short Term Goal 3 (Week 2): Pt will donn pants and shoes with AE and min assist. OT Short Term Goal 4 (Week 2): Pt will perform toilet transfers with supervision. OT Short Term Goal 5 (Week 2): Pt will use the RUE with holding washcloth and  objects to open during B/D tasks with min assist.    Skilled Therapeutic Interventions/Progress Updates:    During session pt transferred to the wheelchair from the bed with min assist.  He was able to perform UB bathing with min assist but continues to need max assist to integrate the RUE into function.  Pt still demonstrates decreased problem solving and initiation through the task.  He needs min assist for sit to stand during LB bathing but tends to demonstrate increased flexion in the right knee with lean to the right resulting in the need for constant min assist level.  He also utilized the long handle sponge for LB bathing as well.  Therapist assisted with all dressing tasks this session as time expired and pt needed to return back to bed for IV team to place IV.    Therapy Documentation Precautions:  Precautions Precautions: Fall Precaution Comments: R sided weakness (RUE>RLE), dysarthria Restrictions Weight Bearing Restrictions: No  Pain: Pain Assessment Pain Assessment: No/denies pain Pain Score: 0-No pain Patients Stated Pain Goal: 3 Multiple Pain Sites: No ADL: See FIM for current functional status  Therapy/Group: Individual Therapy  Glinda Natzke OTR/L 09/29/2013, 11:15 AM

## 2013-09-29 NOTE — Plan of Care (Signed)
Problem: RH SKIN INTEGRITY Goal: RH STG ABLE TO PERFORM INCISION/WOUND CARE W/ASSISTANCE STG Able To Perform Incision/Wound Care With total Assistance from caregiver  Outcome: Progressing Wife changes dressing to left shin diabetic ulcer,uses silver dressing from [facility] hospital

## 2013-09-29 NOTE — Progress Notes (Signed)
Physical Therapy Note  Patient Details  Name: Brad Taylor MRN: [id number] Date of Birth: 01/24/1951 Today's Date: 09/29/2013  0258-5277 (40 minutes) individual Pain: no reported pain Focus of treatment: Therapeutic exercise focused on right LE strengthening/ activity tolerance; gait training Treatment: Pt in bed upon arrival; supine to sit using bedrail SBA; transfer stand/ turn min assist ; Nustep Level 4 X 10 minutes (using wrap RT hand);  Sit to stand from wc min assist for safety/balance (pt mildly impulsive ); gait forward/sideways with vcs to decrease right hip ER and backwards in parallel bars with unilateral left UE support min assist '; returned to room with all needs within reach; wife present.    [name],JIM 09/29/2013, 11:00 AM

## 2013-09-29 NOTE — Progress Notes (Signed)
Occupational Therapy Session Note  Patient Details  Name: Brad Taylor MRN: [id number] Date of Birth: 1950/08/19  Today's Date: 09/29/2013 Time: 5170-0174 Time Calculation (min): 38 min   Skilled Therapeutic Interventions/Progress Updates:    Pt worked on Brewing technologist during session.  Had pt sit on therapy mat and work on maintaining right elbow extension while having hand placed on the mat and reaching across his body with the LUE.  He can elicit some active elbow extension to support himself but needs mod instructional cueing to maintain selective attention to his right hand.  He is only able to maintain elbow extension for only 10-15 seconds at a time.  Progressed to having pt place hand on a therapy board and maintain it without letting his hand come off.  Pt able to elicit active elbow extension and shoulder flexion on the board with min assist for maintaining contact.  Pt needing cueing to keep from overcompensating with his trunk when attempting shoulder flexion movements.  Finished session with hand over hand assistance for reaching to knee level and grasping foam cup with the RUE and then bringing to his mouth for simulated drinking.    Therapy Documentation Precautions:  Precautions Precautions: Fall Precaution Comments: R sided weakness (RUE>RLE), dysarthria Restrictions Weight Bearing Restrictions: No  Pain: Pain Assessment Pain Assessment: No/denies pain  See FIM for current functional status  Therapy/Group: Individual Therapy  Jeret Goyer OTR/L 09/29/2013, 3:51 PM

## 2013-09-29 NOTE — Progress Notes (Signed)
Subjective/Complaints: 63 y.o. male with history of COPD, diabetes mellitus and peripheral neuropathy, PTSD. Admitted 09/15/2013 with right-sided weakness and slurred speech. MRI of the brain showed acute ischemic infarct involving the posterior limb of the left internal capsule as well as additional subcentimeter ischemic infarct deep white matter of the left centrum semiovale and remote lacunar infarct bilateral basal ganglia. MRA of the head with no stenosis or aneurysm. Echocardiogram is pending. Carotid Dopplers with no ICA stenosis. Patient did not receive TPA. Neurology services consulted placed on aspirin/Plavix for CVA prophylaxis as well as subcutaneous Lovenox for DVT prophylaxis. Maintained on a regular consistency diet  Approciate neuro note Reduced intake, am CBG pnd, pm CBGs elevated yest  Review of Systems - denies poor appetite, states he doesn't like food  Objective: Vital Signs: Blood pressure 131/76, pulse 64, temperature 98.7 F (37.1 C), temperature source Oral, resp. rate 16, weight 125.4 kg (276 lb 7.3 oz), SpO2 93.00%. Brad Taylor Head Wo Contrast  09/27/2013   CLINICAL DATA:  Mental status changes. Right-sided weakness and slurred speech.  EXAM: MRI HEAD WITHOUT CONTRAST  MRA HEAD WITHOUT CONTRAST  TECHNIQUE: Multiplanar, multiecho pulse sequences of the brain and surrounding structures were obtained without intravenous contrast. Angiographic images of the head were obtained using MRA technique without contrast.  COMPARISON:  CT HEAD W/O CM dated 09/19/2013; Brad HEAD W/O CM dated 09/16/2013  FINDINGS: MRI HEAD FINDINGS  The patient was unable to remain motionless for the exam. Small or subtle lesions could be overlooked.  There has been progression of the previously identified deep white matter infarct involving the posterior basal ganglia, posterior limb internal capsule, and centrum semiovale. Cross-sectional measurements today are 10 x 23 mm as compared with 8 x 14 mm previously.  This is evident on both the DWI sequences well as T2 and FLAIR images. Tiny focus of subcortical acute infarction near the posterior frontoparietal junction at the convexity shows progression to subacute status, and has therefore not progressed. No other new infarcts.  Advanced atrophy and chronic microvascular ischemic change. Numerous remote lacunar infarcts. No osseous findings. Extracranial soft tissues unremarkable.  MRA HEAD FINDINGS  Unremarkable internal carotid arteries and basilar artery. Vertebrals codominant. No proximal flow limiting stenosis of the anterior, middle, or posterior cerebral arteries. Within limits of visualization on this motion degraded exam, No cerebellar branch occlusion. No intracranial aneurysm.  IMPRESSION: Slight progression of the previously identified deep white matter infarct involving posterior basal ganglia, posterior limb internal capsule, and centrum semiovale; no hemorrhage. See discussion above.   Electronically Signed   By: Rolla Flatten M.D.   On: 09/27/2013 14:51   Brad Brain Wo Contrast  09/27/2013   CLINICAL DATA:  Mental status changes. Right-sided weakness and slurred speech.  EXAM: MRI HEAD WITHOUT CONTRAST  MRA HEAD WITHOUT CONTRAST  TECHNIQUE: Multiplanar, multiecho pulse sequences of the brain and surrounding structures were obtained without intravenous contrast. Angiographic images of the head were obtained using MRA technique without contrast.  COMPARISON:  CT HEAD W/O CM dated 09/19/2013; Brad HEAD W/O CM dated 09/16/2013  FINDINGS: MRI HEAD FINDINGS  The patient was unable to remain motionless for the exam. Small or subtle lesions could be overlooked.  There has been progression of the previously identified deep white matter infarct involving the posterior basal ganglia, posterior limb internal capsule, and centrum semiovale. Cross-sectional measurements today are 10 x 23 mm as compared with 8 x 14 mm previously. This is evident on both the DWI sequences well as  T2 and FLAIR images. Tiny focus of subcortical acute infarction near the posterior frontoparietal junction at the convexity shows progression to subacute status, and has therefore not progressed. No other new infarcts.  Advanced atrophy and chronic microvascular ischemic change. Numerous remote lacunar infarcts. No osseous findings. Extracranial soft tissues unremarkable.  MRA HEAD FINDINGS  Unremarkable internal carotid arteries and basilar artery. Vertebrals codominant. No proximal flow limiting stenosis of the anterior, middle, or posterior cerebral arteries. Within limits of visualization on this motion degraded exam, No cerebellar branch occlusion. No intracranial aneurysm.  IMPRESSION: Slight progression of the previously identified deep white matter infarct involving posterior basal ganglia, posterior limb internal capsule, and centrum semiovale; no hemorrhage. See discussion above.   Electronically Signed   By: Rolla Flatten M.D.   On: 09/27/2013 14:51   Results for orders placed during the hospital encounter of 09/20/13 (from the past 72 hour(s))  GLUCOSE, CAPILLARY     Status: Abnormal   Collection Time    09/26/13  7:08 AM      Result Value Ref Range   Glucose-Capillary 207 (*) 70 - 99 mg/dL   Comment 1 Notify RN    GLUCOSE, CAPILLARY     Status: Abnormal   Collection Time    09/26/13 11:32 AM      Result Value Ref Range   Glucose-Capillary 228 (*) 70 - 99 mg/dL   Comment 1 Notify RN    GLUCOSE, CAPILLARY     Status: Abnormal   Collection Time    09/26/13  4:48 PM      Result Value Ref Range   Glucose-Capillary 223 (*) 70 - 99 mg/dL   Comment 1 Notify RN    GLUCOSE, CAPILLARY     Status: Abnormal   Collection Time    09/26/13  8:40 PM      Result Value Ref Range   Glucose-Capillary 214 (*) 70 - 99 mg/dL  BASIC METABOLIC PANEL     Status: Abnormal   Collection Time    09/27/13  4:29 AM      Result Value Ref Range   Sodium 134 (*) 137 - 147 mEq/L   Potassium 3.5 (*) 3.7 - 5.3  mEq/L   Chloride 91 (*) 96 - 112 mEq/L   CO2 30  19 - 32 mEq/L   Glucose, Bld 178 (*) 70 - 99 mg/dL   BUN 13  6 - 23 mg/dL   Creatinine, Ser 0.78  0.50 - 1.35 mg/dL   Calcium 8.9  8.4 - 10.5 mg/dL   GFR calc non Af Amer >90  >90 mL/min   GFR calc Af Amer >90  >90 mL/min   Comment: (NOTE)     The eGFR has been calculated using the CKD EPI equation.     This calculation has not been validated in all clinical situations.     eGFR's persistently <90 mL/min signify possible Chronic Kidney     Disease.  GLUCOSE, CAPILLARY     Status: Abnormal   Collection Time    09/27/13  7:26 AM      Result Value Ref Range   Glucose-Capillary 158 (*) 70 - 99 mg/dL   Comment 1 Notify RN    GLUCOSE, CAPILLARY     Status: Abnormal   Collection Time    09/27/13 12:05 PM      Result Value Ref Range   Glucose-Capillary 210 (*) 70 - 99 mg/dL   Comment 1 Notify RN    GLUCOSE, CAPILLARY  Status: Abnormal   Collection Time    09/27/13  4:35 PM      Result Value Ref Range   Glucose-Capillary 207 (*) 70 - 99 mg/dL   Comment 1 Notify RN    GLUCOSE, CAPILLARY     Status: Abnormal   Collection Time    09/27/13  8:32 PM      Result Value Ref Range   Glucose-Capillary 174 (*) 70 - 99 mg/dL  GLUCOSE, CAPILLARY     Status: Abnormal   Collection Time    09/28/13  7:42 AM      Result Value Ref Range   Glucose-Capillary 152 (*) 70 - 99 mg/dL   Comment 1 Notify RN    GLUCOSE, CAPILLARY     Status: Abnormal   Collection Time    09/28/13 11:38 AM      Result Value Ref Range   Glucose-Capillary 193 (*) 70 - 99 mg/dL   Comment 1 Notify RN    GLUCOSE, CAPILLARY     Status: Abnormal   Collection Time    09/28/13  4:37 PM      Result Value Ref Range   Glucose-Capillary 287 (*) 70 - 99 mg/dL   Comment 1 Notify RN    GLUCOSE, CAPILLARY     Status: Abnormal   Collection Time    09/28/13  8:43 PM      Result Value Ref Range   Glucose-Capillary 216 (*) 70 - 99 mg/dL   Comment 1 Notify RN         General:  No acute distress Mood and affect are appropriate Heart: Regular rate and rhythm no rubs murmurs or extra sounds Lungs: Clear to auscultation, breathing unlabored, no rales or wheezes, poor inspiratory effort Abdomen: Positive bowel sounds, soft nontender to palpation, nondistended Extremities: No clubbing, cyanosis, or edema Skin: No evidence of breakdown, no evidence of rash Neurologic: pt is more arousable. Could tell me he was at [facility]. Voice clearer. Cranial nerves II through XII intact, motor strength is 5/5 in left deltoid, bicep, tricep, grip, hip flexor, knee extensors, ankle dorsiflexor and plantar flexor, 3- RUE, 4- R KE, 1/5 R ankle Sensory exam Cerebellar exam normal finger to nose to finger as well as heel to shin in bilateral upper and lower extremities Musculoskeletal: Full range of motion in all 4 extremities. No joint swelling  Assessment/Plan: 1. Functional deficits secondary to embolic left internal capsule/deep white matter infarct   which require 3+ hours per day of interdisciplinary therapy in a comprehensive inpatient rehab setting. Physiatrist is providing close team supervision and 24 hour management of active medical problems listed below. Physiatrist and rehab team continue to assess barriers to discharge/monitor patient progress toward functional and medical goals. FIM: FIM - Bathing Bathing Steps Patient Completed: Chest;Abdomen;Front perineal area;Right upper leg;Left upper leg;Right Arm Bathing: 3: Mod-Patient completes 5-7 1f 10 parts or 50-74%  FIM - Upper Body Dressing/Undressing Upper body dressing/undressing steps patient completed: Thread/unthread left sleeve of pullover shirt/dress Upper body dressing/undressing: 0: Wears gown/pajamas-no public clothing FIM - Lower Body Dressing/Undressing Lower body dressing/undressing steps patient completed: Fasten/unfasten right shoe Lower body dressing/undressing: 0: Activity did not occur (Therapist had to assist  with dressing secondary to decreased time.)  FIM - Toileting Toileting steps completed by patient: Adjust clothing prior to toileting Toileting Assistive Devices: Grab bar or rail for support Toileting: 2: Max-Patient completed 1 of 3 steps  FIM - Diplomatic Services operational officer Devices: Grab bars Toilet Transfers: 4-To toilet/BSC:  Min A (steadying Pt. > 75%);4-From toilet/BSC: Min A (steadying Pt. > 75%)  FIM - Bed/Chair Transfer Bed/Chair Transfer Assistive Devices: Arm rests Bed/Chair Transfer: 5: Supine > Sit: Supervision (verbal cues/safety issues);4: Bed > Chair or W/C: Min A (steadying Pt. > 75%)  FIM - Locomotion: Wheelchair Distance: 10 Locomotion: Wheelchair: 0: Activity did not occur FIM - Locomotion: Ambulation Locomotion: Ambulation Assistive Devices: Other (comment) (none) Ambulation/Gait Assistance: 3: Mod assist Locomotion: Ambulation: 2: Travels 50 - 149 ft with moderate assistance (Pt: 50 - 74%)  Comprehension Comprehension Mode: Auditory Comprehension: 4-Understands basic 75 - 89% of the time/requires cueing 10 - 24% of the time  Expression Expression Mode: Verbal Expression: 3-Expresses basic 50 - 74% of the time/requires cueing 25 - 50% of the time. Needs to repeat parts of sentences.  Social Interaction Social Interaction: 4-Interacts appropriately 75 - 89% of the time - Needs redirection for appropriate language or to initiate interaction.  Problem Solving Problem Solving: 3-Solves basic 50 - 74% of the time/requires cueing 25 - 49% of the time  Memory Memory: 3-Recognizes or recalls 50 - 74% of the time/requires cueing 25 - 49% of the time  Medical Problem List and Plan:  1. Left internal capsule/deep white matter infarcts felt to be SVD thrombotic    2. DVT Prophylaxis/Anticoagulation: Subcutaneous Lovenox. Monitor platelet counts and any signs of bleeding  3. Pain Management: Tylenol as needed  4. Mood/PTSD/depression. Wellbutrin 150 mg  daily, BuSpar 5 mg twice a day as needed, Depakote 1000 mg each bedtime, effexor 150 mg once a day and trazodone 300 mg each bedtime -consult to neuropsych 5. Neuropsych: This patient is not yet capable of making decisions on his own behalf.  6. Diabetes mellitus with peripheral neuropathy.uncontrolled titrating Lantus --increase am dose   Hemoglobin A1c 7.4. Currently on sliding scale insulin.   Check blood sugars a.c. and at bedtime resume oral agents as directed   -still receiving much less insulin than home dose goal is >110,<180 7. Hypertension. Lisinopril 10 mg day. Monitor with increased activity  8. Hypothyroidism. Synthroid .TSH 4.045  9. Hyperlipidemia. Lipitor  10. GERD. Protonix  11. Left lateral lower extremity ulcer. Followup wound care nurse with dressing changes as directed/Unna boot 12. Lethargy: multifactorial, the MRI findings may represent normal evolution of L PLIC infarct, UTI being treated with resolution of neutrophilia and fever, hypothyroid on supplements  .  LOS (Days) 9 A FACE TO FACE EVALUATION WAS PERFORMED  KIRSTEINS,ANDREW E 09/29/2013, 6:57 AM

## 2013-09-29 NOTE — Progress Notes (Signed)
The skilled treatment and weekly notes have been reviewed and SLP is in agreement. Shannie Kontos, M.A., CCC-SLP [phone]  

## 2013-09-29 NOTE — Progress Notes (Signed)
Speech Language Pathology Weekly Progress and Session Note  Patient Details  Name: Brad Taylor MRN: [id number] Date of Birth: Jul 07, 1951  Today's Date: 09/29/2013 Time: 1330-1400 Time Calculation (min): 30 min  Short Term Goals: Week 1: SLP Short Term Goal 1 (Week 1): Patient will utilize speech intelligibility strategies at the sentnece level with Min vebral cues to self-monitor and correct errors. SLP Short Term Goal 1 - Progress (Week 1): Met SLP Short Term Goal 2 (Week 1): Patient will utilize word finding strategies in conversation with Min verbal cues. SLP Short Term Goal 2 - Progress (Week 1): Progressing toward goal SLP Short Term Goal 3 (Week 1): Patient will demonstrate problem solving with daily complex problems with Min verbal cues to self-monitor and correct errors. SLP Short Term Goal 3 - Progress (Week 1): Progressing toward goal    New Short Term Goals: Week 2: SLP Short Term Goal 1 (Week 2): Patient will utilize speech intelligibility strategies in conversation with Min vebral cues to self-monitor and correct errors. SLP Short Term Goal 2 (Week 2): Patient will utilize word finding strategies in conversation with Min verbal cues. SLP Short Term Goal 3 (Week 2): Patient will demonstrate problem solving with daily complex problems with Min verbal cues to self-monitor and correct errors.  Weekly Progress Updates: Patient has met 1/3 short term goals this reporting period. Goal met due to patient's ability to utilize speech intelligibility strategies with Min verbal cues at the sentence level. Goals not met were due to patient's change in status and a missed session for an MRI. Patient continues to benefit from skilled SLP intervention during his CIR stay to address both cognitive and linguistic goals to maximize independence prior to discharge home.   Intensity: Minumum of 1-2 x/day, 30 to 90 minutes Frequency: 5 out of 7 days Duration/Length of Stay: D/C:  10/12/13 Treatment/Interventions: Cognitive remediation/compensation;Cueing hierarchy;Functional tasks;Internal/external aids;Patient/family education;Speech/Language facilitation;Therapeutic Activities   Daily Session  Skilled Therapeutic Interventions: Skilled therapeutic intervention addressed cognitive and linguistic goals. Patient required Max verbal cues to recall speech intelligibility strategies; however he needed Min verbal cues to use strategies in a structured task. SLP facilitated session with sequence cards, patient independently sequenced 3 step sequences and 4 step sequences. Patient self corrected x1. During the four step sequencing, patient put steps in order from right to left and dictated steps in the correct order. When asked directions on how to get back to his room, patient required Mod verbal cues. He could recall his room number from a field of 2. continue with current plan of care.         FIM:  Comprehension Comprehension Mode: Auditory Comprehension: 4-Understands basic 75 - 89% of the time/requires cueing 10 - 24% of the time Expression Expression: 3-Expresses basic 50 - 74% of the time/requires cueing 25 - 50% of the time. Needs to repeat parts of sentences. Social Interaction Social Interaction: 5-Interacts appropriately 90% of the time - Needs monitoring or encouragement for participation or interaction. Problem Solving Problem Solving: 3-Solves basic 50 - 74% of the time/requires cueing 25 - 49% of the time Memory Memory: 3-Recognizes or recalls 50 - 74% of the time/requires cueing 25 - 49% of the time FIM - Eating Eating Activity: 0: Activity did not occur General    Pain Pain Assessment Pain Assessment: No/denies pain  Therapy/Group: Individual Therapy  Emelie Newsom 09/29/2013, 4:54 PM

## 2013-09-30 ENCOUNTER — Encounter (HOSPITAL_COMMUNITY): Payer: Medicare HMO | Admitting: Occupational Therapy

## 2013-09-30 ENCOUNTER — Inpatient Hospital Stay (HOSPITAL_COMMUNITY): Payer: Medicare HMO | Admitting: Occupational Therapy

## 2013-09-30 ENCOUNTER — Inpatient Hospital Stay (HOSPITAL_COMMUNITY): Payer: Medicare HMO | Admitting: Speech Pathology

## 2013-09-30 ENCOUNTER — Inpatient Hospital Stay (HOSPITAL_COMMUNITY): Payer: Medicare HMO | Admitting: Rehabilitation

## 2013-09-30 LAB — GLUCOSE, CAPILLARY
GLUCOSE-CAPILLARY: 217 mg/dL — AB (ref 70–99)
Glucose-Capillary: 181 mg/dL — ABNORMAL HIGH (ref 70–99)
Glucose-Capillary: 269 mg/dL — ABNORMAL HIGH (ref 70–99)
Glucose-Capillary: 199 mg/dL — ABNORMAL HIGH (ref 70–99)

## 2013-09-30 MED ORDER — INSULIN GLARGINE 100 UNIT/ML ~~LOC~~ SOLN
25.0000 [IU] | Freq: Every day | SUBCUTANEOUS | Status: DC
  Administered 2013-10-01 – 2013-10-06 (×6): 25 [IU] via SUBCUTANEOUS
  Filled 2013-09-30 (×10): qty 0.25

## 2013-09-30 NOTE — Progress Notes (Signed)
Occupational Therapy Session Note  Patient Details  Name: Brad Taylor MRN: [id number] Date of Birth: Jul 30, 1951  Today's Date: 09/30/2013 Time: 0800-0905 Time Calculation (min): 65 min  Short Term Goals: Week 2:  OT Short Term Goal 1 (Week 2): Pt will perform all bathing with min assist using AE for sit to stand.   OT Short Term Goal 2 (Week 2): Pt will perform all bathing with min assist using AE for sit to stand.   OT Short Term Goal 3 (Week 2): Pt will donn pants and shoes with AE and min assist. OT Short Term Goal 4 (Week 2): Pt will perform toilet transfers with supervision. OT Short Term Goal 5 (Week 2): Pt will use the RUE with holding washcloth and objects to open during B/D tasks with min assist.    Skilled Therapeutic Interventions/Progress Updates:    Pt worked on bathing and dressing sit to stand at the sink from the wheelchair.  Total assist for integration of the RUE for washing the left arm and right leg, however he is able to hold onto his deodorant for removing the top with min assist.  Min instructional cueing to sequence through bathing as well as min assist for sit to stand and standing balance.  Pt was able to use the reacher to doff his socks as well as donning his brief and pants with min assist to get them over his feet.  He also required min assist to pull them up over his hips as well.  Utilized long handle sponge for washing his feet this session also.  Therapist assisted with donning TEDS and the right shoe but pt was able to donn the left.    Therapy Documentation Precautions:  Precautions Precautions: Fall Precaution Comments: R sided weakness (RUE>RLE), dysarthria Restrictions Weight Bearing Restrictions: No  Pain: Pain Assessment Pain Assessment: No/denies pain ADL: See FIM for current functional status  Therapy/Group: Individual Therapy  Reynoldo Mainer OTR/L 09/30/2013, 11:42 AM

## 2013-09-30 NOTE — Progress Notes (Signed)
Physical Therapy Session Note  Patient Details  Name: Brad Taylor MRN: [id number] Date of Birth: 1950-12-20  Today's Date: 09/30/2013 Time: 0930-1028 Time Calculation (min): 58 min  Short Term Goals: Week 2:  PT Short Term Goal 1 (Week 2): =LTG's due to ELOS  Skilled Therapeutic Interventions/Progress Updates:   Pt received sitting in w/c, agreeable to therapy this morning.  Had pt self propel w/c using BLEs and LUE x 85' to ADL apt this morning at supervision level.  Pt requires increased time to complete task, as well as max cues for coordinating movements of UE with BLEs as he would tend to use only LEs or only UE during mobility.  Once in ADL apt, had pt pull up to L side of bed to prepare for transfer to bed as he would at home.  Demonstrated correct technique for sit>SL>supine>SL>sit.  Pt verbalized understanding then transferred with min assist to bed.  He was able to get into SL with assist for RLE into bed and mod cues for scooting upper trunk over in bed, however was better than previous attempts.  Pt then able to get back into SL with min A for RLE flexion and min assist to elevate trunk into sitting with min facilitation for weight shift to L hip.  Transferred back to w/c as stated above and assisted pt back to gym.  Performed 10 reps of sit <> stand with small block under LLE to increase weight shift and WB through RLE when standing.  Also focused on upright head/trunk when sitting/standing and increased forward weight shift when standing.  He initially requires heavy min assist due to pt attempting to stand straight instead of leaning forward, however progressed very well to min/guard assist.  Tactile cues at chest at times for upright posture.  Performed gait x 100' x 2 with RW and RUE splint at min assist and also used R DF assist AFO.  Pt does very well when cued for slower gait speed, however when fatigued, tends to continue to drag RLE at times.  Min facilitation for increased  weight shift to the L to fully clear RLE.  Also cues for increased heel to toe contact.  Pt ambulated another 31' back to room and was left in w/c with all needs in reach and half lap tray donned on w/c for RUE support.   Therapy Documentation Precautions:  Precautions Precautions: Fall Precaution Comments: R sided weakness (RUE>RLE), dysarthria Restrictions Weight Bearing Restrictions: No   Vital Signs: Therapy Vitals BP: 151/80 mmHg Pain: Pain Assessment Pain Assessment: No/denies pain   Locomotion : Ambulation Ambulation/Gait Assistance: 4: Min assist Wheelchair Mobility Distance: 34'   See FIM for current functional status  Therapy/Group: Individual Therapy  Denice Bors 09/30/2013, 12:24 PM

## 2013-09-30 NOTE — Progress Notes (Signed)
Occupational Therapy Session Note  Patient Details  Name: Brad Taylor MRN: [id number] Date of Birth: May 27, 1951  Today's Date: 09/30/2013 Time: 1300-1330 Time Calculation (min): 30 min  Skilled Therapeutic Interventions/Progress Updates:    Took pt down to the gym via wheelchair.  Had pt transfer to the mat stand pivot with min assist.  Pt unsuccessful on first attempt to stand from the wheelchair secondary to decreased forward weightshift and RLE being too far out in front of him.  Worked on RUE strengthening through [employer].  Placed his right hand on the board and worked on maintaining contact with it as therapist moved it away from him influencing shoulder flexion and elbow extension.  Pt tends to hold his breath when attempting shoulder flexion to maintain contact on the board.  Progressed to using a ball on top of the board and having pt place his hand on the board and maintain the ball in neutral position while therapist tilted the board in an attempt to make the ball roll.  Pt was able to do a good job of not letting the ball roll off of the board during this task with integration of internal and external rotation of the humerus.  Therapy Documentation Precautions:  Precautions Precautions: Fall Precaution Comments: R sided weakness (RUE>RLE), dysarthria Restrictions Weight Bearing Restrictions: No  Pain: Pain Assessment Pain Assessment: No/denies pain ADL: See FIM for current functional status  Therapy/Group: Individual Therapy  Massiah Minjares OTR/L 09/30/2013, 2:09 PM

## 2013-09-30 NOTE — Progress Notes (Signed)
Speech Language Pathology Daily Session Note  Patient Details  Name: Brad Taylor MRN: [id number] Date of Birth: 1951-06-22  Today's Date: 09/30/2013 Time: 0160-1093 Time Calculation (min): 43 min  Short Term Goals: Week 2: SLP Short Term Goal 1 (Week 2): Patient will utilize speech intelligibility strategies in conversation with Min vebral cues to self-monitor and correct errors. SLP Short Term Goal 2 (Week 2): Patient will utilize word finding strategies in conversation with Min verbal cues. SLP Short Term Goal 3 (Week 2): Patient will demonstrate problem solving with daily complex problems with Min verbal cues to self-monitor and correct errors.  Skilled Therapeutic Interventions: Skilled therapeutic intervention addressed cognitive goals. Patient required Mod verbal cues to complete a basic money management task. Patient had to be redirected to task X2 in a quiet environment and needed Mod verbal cues to sequence and organize to correctly complete task. Patient needed Min verbal/question cues to self-correct and ask for help throughout session. Continue with current plan of care.    FIM:  Comprehension Comprehension Mode: Auditory Comprehension: 4-Understands basic 75 - 89% of the time/requires cueing 10 - 24% of the time Expression Expression Mode: Verbal Expression: 3-Expresses basic 50 - 74% of the time/requires cueing 25 - 50% of the time. Needs to repeat parts of sentences. Social Interaction Social Interaction: 5-Interacts appropriately 90% of the time - Needs monitoring or encouragement for participation or interaction. Problem Solving Problem Solving: 3-Solves basic 50 - 74% of the time/requires cueing 25 - 49% of the time Memory Memory: 3-Recognizes or recalls 50 - 74% of the time/requires cueing 25 - 49% of the time FIM - Eating Eating Activity: 0: Activity did not occur  Pain Pain Assessment Pain Assessment: No/denies pain  Therapy/Group: Individual  Therapy  Codee Bloodworth 09/30/2013, 1:27 PM

## 2013-09-30 NOTE — Progress Notes (Signed)
Subjective/Complaints: 63 y.o. male with history of COPD, diabetes mellitus and peripheral neuropathy, PTSD. Admitted 09/15/2013 with right-sided weakness and slurred speech. MRI of the brain showed acute ischemic infarct involving the posterior limb of the left internal capsule as well as additional subcentimeter ischemic infarct deep white matter of the left centrum semiovale and remote lacunar infarct bilateral basal ganglia. MRA of the head with no stenosis or aneurysm. Echocardiogram is pending. Carotid Dopplers with no ICA stenosis. Patient did not receive TPA. Neurology services consulted placed on aspirin/Plavix for CVA prophylaxis as well as subcutaneous Lovenox for DVT prophylaxis. Maintained on a regular consistency diet  Approciate neuro note Pt appears brighter , wife visiting  Review of Systems - denies poor appetite, states he doesn't like food  Objective: Vital Signs: Blood pressure 151/80, pulse 59, temperature 98.3 F (36.8 C), temperature source Oral, resp. rate 17, weight 125.4 kg (276 lb 7.3 oz), SpO2 97.00%. No results found. Results for orders placed during the hospital encounter of 09/20/13 (from the past 72 hour(s))  GLUCOSE, CAPILLARY     Status: Abnormal   Collection Time    09/27/13  4:35 PM      Result Value Ref Range   Glucose-Capillary 207 (*) 70 - 99 mg/dL   Comment 1 Notify RN    GLUCOSE, CAPILLARY     Status: Abnormal   Collection Time    09/27/13  8:32 PM      Result Value Ref Range   Glucose-Capillary 174 (*) 70 - 99 mg/dL  GLUCOSE, CAPILLARY     Status: Abnormal   Collection Time    09/28/13  7:42 AM      Result Value Ref Range   Glucose-Capillary 152 (*) 70 - 99 mg/dL   Comment 1 Notify RN    GLUCOSE, CAPILLARY     Status: Abnormal   Collection Time    09/28/13 11:38 AM      Result Value Ref Range   Glucose-Capillary 193 (*) 70 - 99 mg/dL   Comment 1 Notify RN    GLUCOSE, CAPILLARY     Status: Abnormal   Collection Time    09/28/13  4:37 PM       Result Value Ref Range   Glucose-Capillary 287 (*) 70 - 99 mg/dL   Comment 1 Notify RN    GLUCOSE, CAPILLARY     Status: Abnormal   Collection Time    09/28/13  8:43 PM      Result Value Ref Range   Glucose-Capillary 216 (*) 70 - 99 mg/dL   Comment 1 Notify RN    BASIC METABOLIC PANEL     Status: Abnormal   Collection Time    09/29/13  7:30 AM      Result Value Ref Range   Sodium 137  137 - 147 mEq/L   Potassium 3.7  3.7 - 5.3 mEq/L   Chloride 95 (*) 96 - 112 mEq/L   CO2 31  19 - 32 mEq/L   Glucose, Bld 192 (*) 70 - 99 mg/dL   BUN 10  6 - 23 mg/dL   Creatinine, Ser 0.70  0.50 - 1.35 mg/dL   Calcium 9.0  8.4 - 10.5 mg/dL   GFR calc non Af Amer >90  >90 mL/min   GFR calc Af Amer >90  >90 mL/min   Comment: (NOTE)     The eGFR has been calculated using the CKD EPI equation.     This calculation has not been validated in all  clinical situations.     eGFR's persistently <90 mL/min signify possible Chronic Kidney     Disease.  GLUCOSE, CAPILLARY     Status: Abnormal   Collection Time    09/29/13  7:35 AM      Result Value Ref Range   Glucose-Capillary 165 (*) 70 - 99 mg/dL  GLUCOSE, CAPILLARY     Status: Abnormal   Collection Time    09/29/13 11:38 AM      Result Value Ref Range   Glucose-Capillary 188 (*) 70 - 99 mg/dL  GLUCOSE, CAPILLARY     Status: Abnormal   Collection Time    09/29/13  5:12 PM      Result Value Ref Range   Glucose-Capillary 241 (*) 70 - 99 mg/dL  GLUCOSE, CAPILLARY     Status: Abnormal   Collection Time    09/29/13  8:28 PM      Result Value Ref Range   Glucose-Capillary 205 (*) 70 - 99 mg/dL   Comment 1 Notify RN    GLUCOSE, CAPILLARY     Status: Abnormal   Collection Time    09/30/13  7:13 AM      Result Value Ref Range   Glucose-Capillary 181 (*) 70 - 99 mg/dL   Comment 1 Notify RN    GLUCOSE, CAPILLARY     Status: Abnormal   Collection Time    09/30/13 11:34 AM      Result Value Ref Range   Glucose-Capillary 217 (*) 70 - 99 mg/dL    Comment 1 Notify RN         General: No acute distress Mood and affect are appropriate Heart: Regular rate and rhythm no rubs murmurs or extra sounds Lungs: Clear to auscultation, breathing unlabored, no rales or wheezes, poor inspiratory effort Abdomen: Positive bowel sounds, soft nontender to palpation, nondistended Extremities: No clubbing, cyanosis, or edema Skin: No evidence of breakdown, no evidence of rash Neurologic: pt is more arousable. Could tell me he was at [facility]. Voice clearer. Cranial nerves II through XII intact, motor strength is 5/5 in left deltoid, bicep, tricep, grip, hip flexor, knee extensors, ankle dorsiflexor and plantar flexor, 3- RUE,3- R HF 4- R KE, 1/5 R ankle Sensory exam Cerebellar exam normal finger to nose to finger as well as heel to shin in bilateral upper and lower extremities Musculoskeletal: Full range of motion in all 4 extremities. No joint swelling  Assessment/Plan: 1. Functional deficits secondary to embolic left internal capsule/deep white matter infarct   which require 3+ hours per day of interdisciplinary therapy in a comprehensive inpatient rehab setting. Physiatrist is providing close team supervision and 24 hour management of active medical problems listed below. Physiatrist and rehab team continue to assess barriers to discharge/monitor patient progress toward functional and medical goals. FIM: FIM - Bathing Bathing Steps Patient Completed: Chest;Abdomen;Front perineal area;Right upper leg;Left upper leg;Right Arm;Right lower leg (including foot);Left lower leg (including foot) Bathing: 4: Min-Patient completes 8-9 [id number] parts or 75+ percent  FIM - Upper Body Dressing/Undressing Upper body dressing/undressing steps patient completed: Thread/unthread left sleeve of pullover shirt/dress;Put head through opening of pull over shirt/dress Upper body dressing/undressing: 3: Mod-Patient completed 50-74% of tasks FIM - Lower Body  Dressing/Undressing Lower body dressing/undressing steps patient completed: Don/Doff left shoe;Thread/unthread right pants leg;Thread/unthread left pants leg;Fasten/unfasten right shoe Lower body dressing/undressing: 3: Mod-Patient completed 50-74% of tasks  FIM - Toileting Toileting steps completed by patient: Adjust clothing prior to toileting Toileting Assistive Devices: Grab bar  or rail for support Toileting: 2: Max-Patient completed 1 of 3 steps  FIM - Radio producer Devices: Grab bars Toilet Transfers: 4-To toilet/BSC: Min A (steadying Pt. > 75%);4-From toilet/BSC: Min A (steadying Pt. > 75%)  FIM - Bed/Chair Transfer Bed/Chair Transfer Assistive Devices: Arm rests Bed/Chair Transfer: 4: Supine > Sit: Min A (steadying Pt. > 75%/lift 1 leg);4: Sit > Supine: Min A (steadying pt. > 75%/lift 1 leg);4: Chair or W/C > Bed: Min A (steadying Pt. > 75%);4: Bed > Chair or W/C: Min A (steadying Pt. > 75%)  FIM - Locomotion: Wheelchair Distance: 85' Locomotion: Wheelchair: 2: Travels 50 - 149 ft with supervision, cueing or coaxing FIM - Locomotion: Ambulation Locomotion: Ambulation Assistive Devices: Administrator Ambulation/Gait Assistance: 4: Min assist Locomotion: Ambulation: 2: Travels 50 - 149 ft with minimal assistance (Pt.>75%)  Comprehension Comprehension Mode: Auditory Comprehension: 4-Understands basic 75 - 89% of the time/requires cueing 10 - 24% of the time  Expression Expression Mode: Verbal Expression: 3-Expresses basic 50 - 74% of the time/requires cueing 25 - 50% of the time. Needs to repeat parts of sentences.  Social Interaction Social Interaction: 5-Interacts appropriately 90% of the time - Needs monitoring or encouragement for participation or interaction.  Problem Solving Problem Solving: 3-Solves basic 50 - 74% of the time/requires cueing 25 - 49% of the time  Memory Memory: 3-Recognizes or recalls 50 - 74% of the time/requires  cueing 25 - 49% of the time  Medical Problem List and Plan:  1. Left internal capsule/deep white matter infarcts felt to be SVD thrombotic    2. DVT Prophylaxis/Anticoagulation: Subcutaneous Lovenox. Monitor platelet counts and any signs of bleeding  3. Pain Management: Tylenol as needed  4. Mood/PTSD/depression. Wellbutrin 150 mg daily, BuSpar 5 mg twice a day as needed, Depakote 1000 mg each bedtime, effexor 150 mg once a day and trazodone 300 mg each bedtime -consult to neuropsych 5. Neuropsych: This patient is not yet capable of making decisions on his own behalf.  6. Diabetes mellitus with peripheral neuropathy.uncontrolled titrating Lantus --increase am dose   Hemoglobin A1c 7.4. Currently on sliding scale insulin.   Check blood sugars a.c. and at bedtime resume oral agents as directed   -still receiving much less insulin than home dose goal is >110,<180 7. Hypertension. Lisinopril 10 mg day. Monitor with increased activity  8. Hypothyroidism. Synthroid .TSH 4.045  9. Hyperlipidemia. Lipitor  10. GERD. Protonix  11. Left lateral lower extremity ulcer. Followup wound care nurse with dressing changes as directed/Unna boot 12. Lethargy: multifactorial, the MRI findings may represent normal evolution of L PLIC infarct, UTI being treated with resolution of neutrophilia and fever, hypothyroid on supplements  .  LOS (Days) 10 A FACE TO FACE EVALUATION WAS PERFORMED  KIRSTEINS,ANDREW E 09/30/2013, 12:45 PM

## 2013-10-01 ENCOUNTER — Encounter (HOSPITAL_COMMUNITY): Payer: Medicare HMO | Admitting: Occupational Therapy

## 2013-10-01 ENCOUNTER — Inpatient Hospital Stay (HOSPITAL_COMMUNITY): Payer: Medicare HMO | Admitting: Rehabilitation

## 2013-10-01 ENCOUNTER — Inpatient Hospital Stay (HOSPITAL_COMMUNITY): Payer: Medicare HMO | Admitting: Speech Pathology

## 2013-10-01 ENCOUNTER — Inpatient Hospital Stay (HOSPITAL_COMMUNITY): Payer: Medicare HMO | Admitting: Occupational Therapy

## 2013-10-01 LAB — GLUCOSE, CAPILLARY
GLUCOSE-CAPILLARY: 141 mg/dL — AB (ref 70–99)
Glucose-Capillary: 153 mg/dL — ABNORMAL HIGH (ref 70–99)
Glucose-Capillary: 192 mg/dL — ABNORMAL HIGH (ref 70–99)
Glucose-Capillary: 205 mg/dL — ABNORMAL HIGH (ref 70–99)

## 2013-10-01 NOTE — Progress Notes (Signed)
Subjective/Complaints: 63 y.o. male with history of COPD, diabetes mellitus and peripheral neuropathy, PTSD. Admitted 09/15/2013 with right-sided weakness and slurred speech. MRI of the brain showed acute ischemic infarct involving the posterior limb of the left internal capsule as well as additional subcentimeter ischemic infarct deep white matter of the left centrum semiovale and remote lacunar infarct bilateral basal ganglia. MRA of the head with no stenosis or aneurysm. Echocardiogram is pending. Carotid Dopplers with no ICA stenosis. Patient did not receive TPA. Neurology services consulted placed on aspirin/Plavix for CVA prophylaxis as well as subcutaneous Lovenox for DVT prophylaxis. Maintained on a regular consistency diet  ADLs with OT, R arm ext and trace grip  Review of Systems - denies poor appetite, states he doesn't like food   Objective: Vital Signs: Blood pressure 139/80, pulse 62, temperature 98.6 F (37 C), temperature source Oral, resp. rate 19, weight 125.4 kg (276 lb 7.3 oz), SpO2 93.00%. No results found. Results for orders placed during the hospital encounter of 09/20/13 (from the past 72 hour(s))  GLUCOSE, CAPILLARY     Status: Abnormal   Collection Time    09/28/13 11:38 AM      Result Value Ref Range   Glucose-Capillary 193 (*) 70 - 99 mg/dL   Comment 1 Notify RN    GLUCOSE, CAPILLARY     Status: Abnormal   Collection Time    09/28/13  4:37 PM      Result Value Ref Range   Glucose-Capillary 287 (*) 70 - 99 mg/dL   Comment 1 Notify RN    GLUCOSE, CAPILLARY     Status: Abnormal   Collection Time    09/28/13  8:43 PM      Result Value Ref Range   Glucose-Capillary 216 (*) 70 - 99 mg/dL   Comment 1 Notify RN    BASIC METABOLIC PANEL     Status: Abnormal   Collection Time    09/29/13  7:30 AM      Result Value Ref Range   Sodium 137  137 - 147 mEq/L   Potassium 3.7  3.7 - 5.3 mEq/L   Chloride 95 (*) 96 - 112 mEq/L   CO2 31  19 - 32 mEq/L   Glucose, Bld 192  (*) 70 - 99 mg/dL   BUN 10  6 - 23 mg/dL   Creatinine, Ser 0.70  0.50 - 1.35 mg/dL   Calcium 9.0  8.4 - 10.5 mg/dL   GFR calc non Af Amer >90  >90 mL/min   GFR calc Af Amer >90  >90 mL/min   Comment: (NOTE)     The eGFR has been calculated using the CKD EPI equation.     This calculation has not been validated in all clinical situations.     eGFR's persistently <90 mL/min signify possible Chronic Kidney     Disease.  GLUCOSE, CAPILLARY     Status: Abnormal   Collection Time    09/29/13  7:35 AM      Result Value Ref Range   Glucose-Capillary 165 (*) 70 - 99 mg/dL  GLUCOSE, CAPILLARY     Status: Abnormal   Collection Time    09/29/13 11:38 AM      Result Value Ref Range   Glucose-Capillary 188 (*) 70 - 99 mg/dL  GLUCOSE, CAPILLARY     Status: Abnormal   Collection Time    09/29/13  5:12 PM      Result Value Ref Range   Glucose-Capillary 241 (*) 70 -  99 mg/dL  GLUCOSE, CAPILLARY     Status: Abnormal   Collection Time    09/29/13  8:28 PM      Result Value Ref Range   Glucose-Capillary 205 (*) 70 - 99 mg/dL   Comment 1 Notify RN    GLUCOSE, CAPILLARY     Status: Abnormal   Collection Time    09/30/13  7:13 AM      Result Value Ref Range   Glucose-Capillary 181 (*) 70 - 99 mg/dL   Comment 1 Notify RN    GLUCOSE, CAPILLARY     Status: Abnormal   Collection Time    09/30/13 11:34 AM      Result Value Ref Range   Glucose-Capillary 217 (*) 70 - 99 mg/dL   Comment 1 Notify RN    GLUCOSE, CAPILLARY     Status: Abnormal   Collection Time    09/30/13  4:31 PM      Result Value Ref Range   Glucose-Capillary 269 (*) 70 - 99 mg/dL   Comment 1 Notify RN    GLUCOSE, CAPILLARY     Status: Abnormal   Collection Time    09/30/13  9:22 PM      Result Value Ref Range   Glucose-Capillary 199 (*) 70 - 99 mg/dL   Comment 1 Notify RN    GLUCOSE, CAPILLARY     Status: Abnormal   Collection Time    10/01/13  7:09 AM      Result Value Ref Range   Glucose-Capillary 153 (*) 70 - 99 mg/dL    Comment 1 Notify RN         General: No acute distress Mood and affect are appropriate Heart: Regular rate and rhythm no rubs murmurs or extra sounds Lungs: Clear to auscultation, breathing unlabored, no rales or wheezes, poor inspiratory effort Abdomen: Positive bowel sounds, soft nontender to palpation, nondistended Extremities: No clubbing, cyanosis, or edema Skin: No evidence of breakdown, no evidence of rash Neurologic: pt is more arousable. Could tell me he was at [facility]. Voice clearer. Cranial nerves II through XII intact, motor strength is 5/5 in left deltoid, bicep, tricep, grip, hip flexor, knee extensors, ankle dorsiflexor and plantar flexor, 3- RUE,  Musculoskeletal: Full range of motion in all 4 extremities. No joint swelling  Assessment/Plan: 1. Functional deficits secondary to embolic left internal capsule/deep white matter infarct   which require 3+ hours per day of interdisciplinary therapy in a comprehensive inpatient rehab setting. Physiatrist is providing close team supervision and 24 hour management of active medical problems listed below. Physiatrist and rehab team continue to assess barriers to discharge/monitor patient progress toward functional and medical goals. FIM: FIM - Bathing Bathing Steps Patient Completed: Chest;Abdomen;Front perineal area;Right upper leg;Left upper leg;Right Arm;Right lower leg (including foot);Left lower leg (including foot) Bathing: 4: Min-Patient completes 8-9 32f10 parts or 75+ percent  FIM - Upper Body Dressing/Undressing Upper body dressing/undressing steps patient completed: Thread/unthread left sleeve of pullover shirt/dress;Put head through opening of pull over shirt/dress Upper body dressing/undressing: 3: Mod-Patient completed 50-74% of tasks FIM - Lower Body Dressing/Undressing Lower body dressing/undressing steps patient completed: Don/Doff left shoe;Thread/unthread right pants leg;Thread/unthread left pants  leg;Fasten/unfasten right shoe Lower body dressing/undressing: 3: Mod-Patient completed 50-74% of tasks  FIM - Toileting Toileting steps completed by patient: Adjust clothing prior to toileting Toileting Assistive Devices: Grab bar or rail for support Toileting: 2: Max-Patient completed 1 of 3 steps  FIM - TRadio producerDevices:  Grab bars Toilet Transfers: 4-To toilet/BSC: Min A (steadying Pt. > 75%);4-From toilet/BSC: Min A (steadying Pt. > 75%)  FIM - Bed/Chair Transfer Bed/Chair Transfer Assistive Devices: Arm rests Bed/Chair Transfer: 4: Supine > Sit: Min A (steadying Pt. > 75%/lift 1 leg);4: Sit > Supine: Min A (steadying pt. > 75%/lift 1 leg);4: Chair or W/C > Bed: Min A (steadying Pt. > 75%);4: Bed > Chair or W/C: Min A (steadying Pt. > 75%)  FIM - Locomotion: Wheelchair Distance: 85' Locomotion: Wheelchair: 2: Travels 50 - 149 ft with supervision, cueing or coaxing FIM - Locomotion: Ambulation Locomotion: Ambulation Assistive Devices: Administrator Ambulation/Gait Assistance: 4: Min assist Locomotion: Ambulation: 2: Travels 50 - 149 ft with minimal assistance (Pt.>75%)  Comprehension Comprehension Mode: Auditory Comprehension: 4-Understands basic 75 - 89% of the time/requires cueing 10 - 24% of the time  Expression Expression Mode: Verbal Expression: 3-Expresses basic 50 - 74% of the time/requires cueing 25 - 50% of the time. Needs to repeat parts of sentences.  Social Interaction Social Interaction: 5-Interacts appropriately 90% of the time - Needs monitoring or encouragement for participation or interaction.  Problem Solving Problem Solving: 3-Solves basic 50 - 74% of the time/requires cueing 25 - 49% of the time  Memory Memory: 3-Recognizes or recalls 50 - 74% of the time/requires cueing 25 - 49% of the time  Medical Problem List and Plan:  1. Left internal capsule/deep white matter infarcts felt to be SVD thrombotic    2. DVT  Prophylaxis/Anticoagulation: Subcutaneous Lovenox. Monitor platelet counts and any signs of bleeding  3. Pain Management: Tylenol as needed  4. Mood/PTSD/depression. Wellbutrin 150 mg daily, BuSpar 5 mg twice a day as needed, Depakote 1000 mg each bedtime, effexor 150 mg once a day and trazodone 300 mg each bedtime -consult to neuropsych 5. Neuropsych: This patient is not yet capable of making decisions on his own behalf.  6. Diabetes mellitus with peripheral neuropathy.uncontrolled titrating Lantus --increase am dose   Hemoglobin A1c 7.4. Currently on sliding scale insulin.   Check blood sugars a.c. and at bedtime resume oral agents as directed   -still receiving much less insulin than home dose goal is >110,<180 7. Hypertension. Lisinopril 10 mg day. Monitor with increased activity  8. Hypothyroidism. Synthroid .TSH 4.045  9. Hyperlipidemia. Lipitor  10. GERD. Protonix  11. Left lateral lower extremity ulcer. Followup wound care nurse with dressing changes as directed/Unna boot 12. Lethargy: improving .  LOS (Days) 11 A FACE TO FACE EVALUATION WAS PERFORMED  Stela Iwasaki E 10/01/2013, 10:31 AM

## 2013-10-01 NOTE — Progress Notes (Signed)
Physical Therapy Session Note  Patient Details  Name: Brad Taylor MRN: [id number] Date of Birth: 1950/08/15  Today's Date: 10/01/2013 Time: 1100-1158 Time Calculation (min): 58 min  Short Term Goals: Week 2:  PT Short Term Goal 1 (Week 2): =LTG's due to ELOS  Skilled Therapeutic Interventions/Progress Updates:   Pt received sitting in w/c in room, having just finished OT session.  Had pt self propel x 85' to gym using BLEs in order to increase NMR through RLE (step length and hamstring strength).  Requires supervision, however was somewhat faster today with task than yesterday.  Once in therapy gym, had pt sit on large blue physioball in order to work on trunk control, upright posture, and increasing dissociation between upper/lower trunk.  Pt able to perform at min assist with tactile cues at hips and sternum for upright posture.  Then had pt transfer back to mat with min assist in order to continue to address sit <> stand from low surface and without UE support for increased forward weight shift and equal WB through LEs and decreasing use of legs on back of seating surface when standing.  Performed at min/guard level with therapist assisting with UEs to ensure he did not use them.  Also had him sit most of way then stand again to increase strength in LEs.  Transitioned to working on scooting on mat one hip at a time to work on trunk dissociation and trunk shortening/lengthening.  Performed with feet supported then feet unsupported to further challenge activation of trunk mm.  Then performed reaching activity R and L with feet unsupported to address trunk shortening/lengthening.  Ended session standing in front of mat with LLE on small block to increase weight shift and WB through RLE and RUE supported on small blue bench while reaching to the R with LUE to increase WB through RUE.  Had pt use LUE only to push back into upright standing.  Requires min/guard assist to control movement of RUE.  Pt  ambulated 68' back to room with RW with R hand splint and R AFO for DF assist.  Requires min assist with cues for upright posture and increased step length RLE.  Tactile cues at hips and sternum.  Pt left in room in w/c with all needs in reach and half lap tray in place.    Therapy Documentation Precautions:  Precautions Precautions: Fall Precaution Comments: R sided weakness (RUE>RLE), dysarthria Restrictions Weight Bearing Restrictions: No   Pain: Pain Assessment Pain Assessment: No/denies pain   Locomotion : Ambulation Ambulation/Gait Assistance: 4: Min assist;4: Min guard Wheelchair Mobility Distance: 15'   See FIM for current functional status  Therapy/Group: Individual Therapy  Denice Bors 10/01/2013, 12:30 PM

## 2013-10-01 NOTE — Progress Notes (Signed)
Occupational Therapy Session Note  Patient Details  Name: SANJUAN SAWA MRN: [id number] Date of Birth: [birth year]  Today's Date: 10/01/2013 Time: 9326-7124 Time Calculation (min): 30 min   Skilled Therapeutic Interventions/Progress Updates:    Pt was rolled down to the OT gym and transferred to the therapy mat with min assist level.  He was transitioned to supine with min assist.  Placed ball between bent knees and instructed pt to not let it fall as this would keep him active in his LEs.  Worked on simple AAROM exercises for shoulder flexion/extension, elbow flexion/extension.  Pt with much stronger activation in the elbow extensors and shoulder extensors compared to flexors.  He was able to elicit AROM against gravity with elbow extension if therapist stabilized his shoulder at 90 degrees.  Transitioned to sitting and using the UE Ranger for continued AROM.  Pt with significant difficulty controlling movement with use of this.  Worked on simple shoulder flexion and elbow extension exercises as well as internal and external rotation.  He needed mod assist to complete small sequences of movements using the UE ranger.  Transferred back to the wheelchair with min assist and returned to room with RUE positioned on 1/2 lap tray.    Therapy Documentation Precautions:  Precautions Precautions: Fall Precaution Comments: R sided weakness (RUE>RLE), dysarthria Restrictions Weight Bearing Restrictions: No  Pain: Pain Assessment Pain Assessment: No/denies pain ADL: See FIM for current functional status  Therapy/Group: Individual Therapy  Charita Lindenberger OTR/L 10/01/2013, 3:41 PM

## 2013-10-01 NOTE — Progress Notes (Signed)
Occupational Therapy Session Note  Patient Details  Name: Brad Taylor MRN: [id number] Date of Birth: 06-29-1951  Today's Date: 10/01/2013 Time: 1003-1103 Time Calculation (min): 60 min  Short Term Goals: Week 2:  OT Short Term Goal 1 (Week 2): Pt will perform all bathing with min assist using AE for sit to stand.   OT Short Term Goal 2 (Week 2): Pt will perform all bathing with min assist using AE for sit to stand.   OT Short Term Goal 3 (Week 2): Pt will donn pants and shoes with AE and min assist. OT Short Term Goal 4 (Week 2): Pt will perform toilet transfers with supervision. OT Short Term Goal 5 (Week 2): Pt will use the RUE with holding washcloth and objects to open during B/D tasks with min assist.    Skilled Therapeutic Interventions/Progress Updates:    Pt performed bathing and dressing sit to stand at the sink during session.  Pt able to integrate the RUE into bathing with max assist level.  He was able to sequence through the task with min questioning cues.  Min assist for sit to stand with mod instructional cueing for hand placement and to place the LLE up under him further.  Pt still with difficulty reaching his LEs so long handle sponge and reacher provided for pt to use with removal of gripper socks and to donn pants over his feet.  He still needs total assist for gripper socks and max assist for shoes and AFO on the RLE.  Pt's wife present for part of session and was able to observe the amount of assist pt needs for bathing and dressing tasks.    Therapy Documentation Precautions:  Precautions Precautions: Fall Precaution Comments: R sided weakness (RUE>RLE), dysarthria Restrictions Weight Bearing Restrictions: No Pain: Pain Assessment Pain Assessment: No/denies pain ADL: See FIM for current functional status  Therapy/Group: Individual Therapy  Jalysa Swopes OTR/L 10/01/2013, 12:16 PM

## 2013-10-01 NOTE — Progress Notes (Signed)
Speech Language Pathology Daily Session Note  Patient Details  Name: Brad Taylor MRN: [id number] Date of Birth: Dec 22, 1950  Today's Date: 10/01/2013 Time: 0915-1000 Time Calculation (min): 45 min  Short Term Goals: Week 2: SLP Short Term Goal 1 (Week 2): Patient will utilize speech intelligibility strategies in conversation with Min vebral cues to self-monitor and correct errors. SLP Short Term Goal 2 (Week 2): Patient will utilize word finding strategies in conversation with Min verbal cues. SLP Short Term Goal 3 (Week 2): Patient will demonstrate problem solving with daily complex problems with Min verbal cues to self-monitor and correct errors.  Skilled Therapeutic Interventions: Skilled treatment session focused on cognitive-linguistic functional goals for areas of problem solving, higher level attention and computation. Patient initially appeared fatigued, but once clinician initiated task (learning a new card game), patient was alert and engaged. He demonstrated task-learning and required moderate, which improved to minimal frequency of cues to alternate attention between card game and conversation with clinician. Patient observed to attempt to self-correct his speech intelligiblity and was able to improve his overall phrase level intelligilbility from 60% to 75%  when he slowed down speech rate and used strategies to over-emphasize (over-articulate).    FIM:  Comprehension Comprehension Mode: Auditory Comprehension: 5-Follows basic conversation/direction: With extra time/assistive device Expression Expression Mode: Verbal Expression: 3-Expresses basic 50 - 74% of the time/requires cueing 25 - 50% of the time. Needs to repeat parts of sentences. Social Interaction Social Interaction: 5-Interacts appropriately 90% of the time - Needs monitoring or encouragement for participation or interaction. Problem Solving Problem Solving: 4-Solves basic 75 - 89% of the time/requires cueing 10  - 24% of the time Memory Memory: 4-Recognizes or recalls 75 - 89% of the time/requires cueing 10 - 24% of the time  Pain Pain Assessment Pain Assessment: No/denies pain  Therapy/Group: Individual Therapy  [name] 10/01/2013, 2:36 PM  Sonia Baller, MA, CCC-SLP [facility] Speech-Language Pathologist

## 2013-10-02 ENCOUNTER — Inpatient Hospital Stay (HOSPITAL_COMMUNITY): Payer: Medicare HMO | Admitting: Occupational Therapy

## 2013-10-02 DIAGNOSIS — E118 Type 2 diabetes mellitus with unspecified complications: Secondary | ICD-10-CM

## 2013-10-02 DIAGNOSIS — I119 Hypertensive heart disease without heart failure: Secondary | ICD-10-CM

## 2013-10-02 DIAGNOSIS — IMO0002 Reserved for concepts with insufficient information to code with codable children: Secondary | ICD-10-CM

## 2013-10-02 DIAGNOSIS — E1165 Type 2 diabetes mellitus with hyperglycemia: Secondary | ICD-10-CM

## 2013-10-02 LAB — GLUCOSE, CAPILLARY
GLUCOSE-CAPILLARY: 202 mg/dL — AB (ref 70–99)
GLUCOSE-CAPILLARY: 198 mg/dL — AB (ref 70–99)
GLUCOSE-CAPILLARY: 228 mg/dL — AB (ref 70–99)
Glucose-Capillary: 158 mg/dL — ABNORMAL HIGH (ref 70–99)

## 2013-10-02 NOTE — Progress Notes (Signed)
Brad Taylor is a 63 y.o. male 09-11-1950 [id number]  Subjective: No new complaints. No new problems. Slept well. Feeling OK.  Objective: Vital signs in last 24 hours: Temp:  [97.8 F (36.6 C)-98.8 F (37.1 C)] 97.8 F (36.6 C) (02/28 3536) Pulse Rate:  [50-57] 50 (02/28 0633) Resp:  [16-20] 16 (02/28 0633) BP: (120-130)/(70-78) 130/78 mmHg (02/28 0633) SpO2:  [93 %-97 %] 93 % (02/28 0633) Weight change:  Last BM Date: 10/02/13  Intake/Output from previous day: 02/27 0701 - 02/28 0700 In: 1080 [P.O.:1080] Out: 300 [Urine:300] Last cbgs: CBG (last 3)   Recent Labs  10/01/13 1155 10/01/13 1632 10/01/13 2053  GLUCAP 141* 192* 205*     Physical Exam General: No apparent distress   HEENT: not dry Lungs: Normal effort. Lungs clear to auscultation, no crackles or wheezes. Cardiovascular: Regular rate and rhythm, no edema Abdomen: S/NT/ND; BS(+) Musculoskeletal:  unchanged Neurological: No new neurological deficits Wounds: N/A    Skin: clear  Aging changes Mental state: Alert, dysarthric, cooperative    Lab Results: BMET    Component Value Date/Time   NA 137 09/29/2013 0730   K 3.7 09/29/2013 0730   CL 95* 09/29/2013 0730   CO2 31 09/29/2013 0730   GLUCOSE 192* 09/29/2013 0730   BUN 10 09/29/2013 0730   CREATININE 0.70 09/29/2013 0730   CALCIUM 9.0 09/29/2013 0730   GFRNONAA >90 09/29/2013 0730   GFRAA >90 09/29/2013 0730   CBC    Component Value Date/Time   WBC 7.6 09/26/2013 0500   RBC 4.44 09/26/2013 0500   HGB 13.9 09/26/2013 0500   HCT 39.5 09/26/2013 0500   PLT 155 09/26/2013 0500   MCV 89.0 09/26/2013 0500   MCH 31.3 09/26/2013 0500   MCHC 35.2 09/26/2013 0500   RDW 13.1 09/26/2013 0500   LYMPHSABS 3.6 09/21/2013 0545   MONOABS 0.9 09/21/2013 0545   EOSABS 0.7 09/21/2013 0545   BASOSABS 0.1 09/21/2013 0545    Studies/Results: No results found.  Medications: I have reviewed the patient's current medications.  Assessment/Plan:   1. 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 DVT Prophylaxis/Anticoagulation: Subcutaneous Lovenox. Monitor platelet counts and any signs of bleeding  3. Pain Management: Tylenol as needed  4. Mood/PTSD/depression. Wellbutrin 150 mg daily, BuSpar 5 mg twice a day as needed, Depakote 1000 mg each bedtime, effexor 150 mg once a day and trazodone 300 mg each bedtime -consult to neuropsych  5. Neuropsych: This patient is not yet capable of making decisions on his own behalf.  6. Diabetes mellitus with peripheral neuropathy.uncontrolled titrating Lantus --increase am dose  Hemoglobin A1c 7.4. Currently on sliding scale insulin. Check blood sugars a.c. and at bedtime resume oral agents as directed  -still receiving much less insulin than home dose goal is >110,<180  7. Hypertension. Lisinopril 10 mg day. Monitor with increased activity  8. Hypothyroidism. Synthroid .TSH 4.045  9. Hyperlipidemia. Lipitor  10. GERD. Protonix  11. Left lateral lower extremity ulcer. Followup wound care nurse with dressing changes as directed/Unna boot  12. Lethargy: improving .    Length of stay, days: 12  Walker Kehr , MD 10/02/2013, 11:06 AM

## 2013-10-02 NOTE — Progress Notes (Signed)
Occupational Therapy Session Note  Patient Details  Name: Brad Taylor MRN: [id number] Date of Birth: 10/29/1950  Today's Date: 10/02/2013 Time: 5597-4163 Time Calculation (min): 42 min  Skilled Therapeutic Interventions/Progress Updates:    Pt performed peri washing and donning new briefs and pants secondary to pants and brief being soaked when therapist attempted to help pt transfer from the bed to the wheelchair.  Pt needed min assist for transfer as well as for standing at the sink for washing.  He was unable to reach his buttocks effectively so needed mod facilitation for thoroughness.  Therapist assisted with donning new brief and pants secondary to time.  Pt then taken to the gym and transferred to the mat stand pivot with min assist again.  Worked on RUE active movement using sliding board and washcloth placed under the right hand.  Pt needing mod facilitation to push his hand out to the end of the board while performing shoulder flexion and elbow extension.  He required mod facilitation to keep from over compensating with his trunk as well.    Therapy Documentation Precautions:  Precautions Precautions: Fall Precaution Comments: R sided weakness (RUE>RLE), dysarthria Restrictions Weight Bearing Restrictions: No  Pain: Pain Assessment Pain Assessment: No/denies pain ADL: See FIM for current functional status  Therapy/Group: Individual Therapy  Zyir Gassert OTR/L 10/02/2013, 3:59 PM

## 2013-10-03 ENCOUNTER — Inpatient Hospital Stay (HOSPITAL_COMMUNITY): Payer: Medicare HMO

## 2013-10-03 ENCOUNTER — Inpatient Hospital Stay (HOSPITAL_COMMUNITY): Payer: Medicare HMO | Admitting: Rehabilitation

## 2013-10-03 LAB — GLUCOSE, CAPILLARY
GLUCOSE-CAPILLARY: 192 mg/dL — AB (ref 70–99)
GLUCOSE-CAPILLARY: 221 mg/dL — AB (ref 70–99)
Glucose-Capillary: 200 mg/dL — ABNORMAL HIGH (ref 70–99)
Glucose-Capillary: 234 mg/dL — ABNORMAL HIGH (ref 70–99)

## 2013-10-03 NOTE — Progress Notes (Signed)
Brad Taylor is a 63 y.o. male [birth year] [id number]  Subjective: No new complaints. No new problems. Slept well. Feeling OK.  Objective: Vital signs in last 24 hours: Temp:  [97.8 F (36.6 C)-97.9 F (36.6 C)] 97.9 F (36.6 C) (03/01 0528) Pulse Rate:  [61-70] 61 (03/01 0528) Resp:  [17] 17 (03/01 0528) BP: (112-129)/(64-65) 129/65 mmHg (03/01 0528) SpO2:  [92 %-98 %] 92 % (03/01 0528) Weight change:  Last BM Date: 10/02/13  Intake/Output from previous day: 02/28 0701 - 03/01 0700 In: 1200 [P.O.:1200] Out: 2100 [Urine:2100] Last cbgs: CBG (last 3)   Recent Labs  10/02/13 2022 10/03/13 0710 10/03/13 1130  GLUCAP 228* 200* 234*     Physical Exam General: No apparent distress   HEENT: not dry Lungs: Normal effort. Lungs clear to auscultation, no crackles or wheezes. Cardiovascular: Regular rate and rhythm, no edema Abdomen: S/NT/ND; BS(+) Musculoskeletal:  unchanged Neurological: No new neurological deficits Wounds: N/A    Skin: clear  Aging changes Mental state: Alert, dysarthric, cooperative    Lab Results: BMET    Component Value Date/Time   NA 137 09/29/2013 0730   K 3.7 09/29/2013 0730   CL 95* 09/29/2013 0730   CO2 31 09/29/2013 0730   GLUCOSE 192* 09/29/2013 0730   BUN 10 09/29/2013 0730   CREATININE 0.70 09/29/2013 0730   CALCIUM 9.0 09/29/2013 0730   GFRNONAA >90 09/29/2013 0730   GFRAA >90 09/29/2013 0730   CBC    Component Value Date/Time   WBC 7.6 09/26/2013 0500   RBC 4.44 09/26/2013 0500   HGB 13.9 09/26/2013 0500   HCT 39.5 09/26/2013 0500   PLT 155 09/26/2013 0500   MCV 89.0 09/26/2013 0500   MCH 31.3 09/26/2013 0500   MCHC 35.2 09/26/2013 0500   RDW 13.1 09/26/2013 0500   LYMPHSABS 3.6 09/21/2013 0545   MONOABS 0.9 09/21/2013 0545   EOSABS 0.7 09/21/2013 0545   BASOSABS 0.1 09/21/2013 0545    Studies/Results: No results found.  Medications: I have reviewed the patient's current medications.  Assessment/Plan:   1. 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 DVT Prophylaxis/Anticoagulation: Subcutaneous Lovenox. Monitor platelet counts and any signs of bleeding  3. Pain Management: Tylenol as needed  4. Mood/PTSD/depression. Wellbutrin 150 mg daily, BuSpar 5 mg twice a day as needed, Depakote 1000 mg each bedtime, effexor 150 mg once a day and trazodone 300 mg each bedtime -consult to neuropsych  5. Neuropsych: This patient is not yet capable of making decisions on his own behalf.  6. Diabetes mellitus with peripheral neuropathy.uncontrolled titrating Lantus --increase am dose  Hemoglobin A1c 7.4. Currently on sliding scale insulin. Check blood sugars a.c. and at bedtime resume oral agents as directed  -still receiving much less insulin than home dose goal is >110,<180  7. Hypertension. Lisinopril 10 mg day. Monitor with increased activity  8. Hypothyroidism. Synthroid .TSH 4.045  9. Hyperlipidemia. Lipitor  10. GERD. Protonix  11. Left lateral lower extremity ulcer. Followup wound care nurse with dressing changes as directed/Unna boot  12. Lethargy: improving  Cont Rx    Length of stay, days: [place] , [place] 10/03/2013, 1:27 PM

## 2013-10-03 NOTE — Progress Notes (Signed)
Physical Therapy Note  Patient Details  Name: TURRELL SEVERT MRN: [id number] Date of Birth: 1951/07/09 Today's Date: 10/03/2013  2:20 - 3:05 45 minutes Individual session Patient denies pain.  Patient sitting in wheelchair visiting with family upon entering room. Patient assisted with donning shoes and AFO on right. Patient ambulated 150 feet with rolling walker, hand orthosis, and right AFO with mod assist primarily with turns losing balance to right. Patient used kinetron to work on sit to stand with equal weight on LE's; 5x sit to stand with right LE on floor and left LE raised on pedal; 20 steps x 2 in sitting at 20 RPM; and 20 steps x 2 in standing with rest breaks in between. Patient side stepped 10 feet each direction using railing in hallway with min assist to work on weight shifting. Patient propelled wheelchair using bilateral LE's 100 feet to work on right stride and hamstrings. Patient left in wheelchair with all items in reach and wife present in room.    Elder Love M 10/03/2013, 3:08 PM

## 2013-10-04 ENCOUNTER — Inpatient Hospital Stay (HOSPITAL_COMMUNITY): Payer: Medicare HMO | Admitting: Physical Therapy

## 2013-10-04 ENCOUNTER — Inpatient Hospital Stay (HOSPITAL_COMMUNITY): Payer: Medicare HMO | Admitting: Occupational Therapy

## 2013-10-04 ENCOUNTER — Inpatient Hospital Stay (HOSPITAL_COMMUNITY): Payer: Medicare HMO | Admitting: Speech Pathology

## 2013-10-04 LAB — GLUCOSE, CAPILLARY
GLUCOSE-CAPILLARY: 161 mg/dL — AB (ref 70–99)
Glucose-Capillary: 196 mg/dL — ABNORMAL HIGH (ref 70–99)
Glucose-Capillary: 237 mg/dL — ABNORMAL HIGH (ref 70–99)
Glucose-Capillary: 177 mg/dL — ABNORMAL HIGH (ref 70–99)

## 2013-10-04 NOTE — Progress Notes (Signed)
Resting good at night. Wearing condom cath at [facility] to manage incontinence. PO intake adequate-? DC HS Ivf's? Dysarthric speech-Requiring cues to slow down. Disoriented to time, on occasion at night. Bedalarm in place to alert staff, with attempts OOB. Brad Taylor A

## 2013-10-04 NOTE — Progress Notes (Signed)
Occupational Therapy Session Note  Patient Details  Name: Brad Taylor MRN: [id number] Date of Birth: 29-Mar-1951  Today's Date: 10/04/2013 Time: 0347-4259 Time Calculation (min): 43 min  Skilled Therapeutic Interventions/Progress Updates:    Pt worked on NMES for the right wrist and digit extensors during session.  Settings of e-stim at intensity of 60, with 50 pulse width, and duration 12 mins.  On time for 10 seconds off time for 5 seconds.  Had pt work on closing his hand when stimulation was off and helping to extend his digits when stimulation was on.  Also incorporated shoulder flexion and elbow extension with digit extension as precursor for functional reach.  Pt able to demonstrate 50% gross grasp and 10 % digit extension.    Therapy Documentation Precautions:  Precautions Precautions: Fall Precaution Comments: R sided weakness (RUE>RLE), dysarthria Restrictions Weight Bearing Restrictions: No  Pain: Pain Assessment Pain Assessment: No/denies pain ADL: See FIM for current functional status  Therapy/Group: Individual Therapy  Itali Mckendry OTR/L 10/04/2013, 3:36 PM

## 2013-10-04 NOTE — Progress Notes (Signed)
Subjective/Complaints: 63 y.o. male with history of COPD, diabetes mellitus and peripheral neuropathy, PTSD. Admitted 09/15/2013 with right-sided weakness and slurred speech. MRI of the brain showed acute ischemic infarct involving the posterior limb of the left internal capsule as well as additional subcentimeter ischemic infarct deep white matter of the left centrum semiovale and remote lacunar infarct bilateral basal ganglia.   Oral intake much improved taking adequate caloric and fluid IV expired per RN will DC   Review of Systems - denies poor appetite, states he doesn't like food   Objective: Vital Signs: Blood pressure 132/71, pulse 57, temperature 98.3 F (36.8 C), temperature source Oral, resp. rate 17, weight 125.4 kg (276 lb 7.3 oz), SpO2 95.00%. No results found. Results for orders placed during the hospital encounter of 09/20/13 (from the past 72 hour(s))  GLUCOSE, CAPILLARY     Status: Abnormal   Collection Time    10/01/13  4:32 PM      Result Value Ref Range   Glucose-Capillary 192 (*) 70 - 99 mg/dL   Comment 1 Notify RN    GLUCOSE, CAPILLARY     Status: Abnormal   Collection Time    10/01/13  8:53 PM      Result Value Ref Range   Glucose-Capillary 205 (*) 70 - 99 mg/dL   Comment 1 Notify RN    GLUCOSE, CAPILLARY     Status: Abnormal   Collection Time    10/02/13  7:16 AM      Result Value Ref Range   Glucose-Capillary 158 (*) 70 - 99 mg/dL   Comment 1 Notify RN    GLUCOSE, CAPILLARY     Status: Abnormal   Collection Time    10/02/13 11:27 AM      Result Value Ref Range   Glucose-Capillary 202 (*) 70 - 99 mg/dL   Comment 1 Notify RN    GLUCOSE, CAPILLARY     Status: Abnormal   Collection Time    10/02/13  4:29 PM      Result Value Ref Range   Glucose-Capillary 198 (*) 70 - 99 mg/dL   Comment 1 Notify RN    GLUCOSE, CAPILLARY     Status: Abnormal   Collection Time    10/02/13  8:22 PM      Result Value Ref Range   Glucose-Capillary 228 (*) 70 - 99 mg/dL    Comment 1 Notify RN    GLUCOSE, CAPILLARY     Status: Abnormal   Collection Time    10/03/13  7:10 AM      Result Value Ref Range   Glucose-Capillary 200 (*) 70 - 99 mg/dL   Comment 1 Notify RN    GLUCOSE, CAPILLARY     Status: Abnormal   Collection Time    10/03/13 11:30 AM      Result Value Ref Range   Glucose-Capillary 234 (*) 70 - 99 mg/dL   Comment 1 Notify RN    GLUCOSE, CAPILLARY     Status: Abnormal   Collection Time    10/03/13  4:29 PM      Result Value Ref Range   Glucose-Capillary 192 (*) 70 - 99 mg/dL   Comment 1 Notify RN    GLUCOSE, CAPILLARY     Status: Abnormal   Collection Time    10/03/13  8:41 PM      Result Value Ref Range   Glucose-Capillary 221 (*) 70 - 99 mg/dL  GLUCOSE, CAPILLARY     Status: Abnormal  Collection Time    10/04/13  7:31 AM      Result Value Ref Range   Glucose-Capillary 161 (*) 70 - 99 mg/dL   Comment 1 Notify RN    GLUCOSE, CAPILLARY     Status: Abnormal   Collection Time    10/04/13 12:04 PM      Result Value Ref Range   Glucose-Capillary 196 (*) 70 - 99 mg/dL   Comment 1 Notify RN         General: No acute distress Mood and affect are appropriate Heart: Regular rate and rhythm no rubs murmurs or extra sounds Lungs: Clear to auscultation, breathing unlabored, no rales or wheezes, poor inspiratory effort Abdomen: Positive bowel sounds, soft nontender to palpation, nondistended Extremities: No clubbing, cyanosis, or edema Skin: No evidence of breakdown, no evidence of rash Neurologic: pt is more arousable. Could tell me he was at [facility]. Voice clearer. Cranial nerves II through XII intact, motor strength is 5/5 in left deltoid, bicep, tricep, grip, hip flexor, knee extensors, ankle dorsiflexor and plantar flexor, 3- RUE,  Musculoskeletal: Full range of motion in all 4 extremities. No joint swelling  Assessment/Plan: 1. Functional deficits secondary to embolic left internal capsule/deep white matter infarct   which require  3+ hours per day of interdisciplinary therapy in a comprehensive inpatient rehab setting. Physiatrist is providing close team supervision and 24 hour management of active medical problems listed below. Physiatrist and rehab team continue to assess barriers to discharge/monitor patient progress toward functional and medical goals. FIM: FIM - Bathing Bathing Steps Patient Completed: Chest;Abdomen;Front perineal area;Right upper leg;Left upper leg;Right Arm;Right lower leg (including foot);Left lower leg (including foot);Left Arm;Buttocks (used long handled sponge for left arm and buttocks) Bathing: 4: Steadying assist  FIM - Upper Body Dressing/Undressing Upper body dressing/undressing steps patient completed: Thread/unthread left sleeve of pullover shirt/dress;Put head through opening of pull over shirt/dress;Pull shirt over trunk Upper body dressing/undressing: 4: Min-Patient completed 75 plus % of tasks FIM - Lower Body Dressing/Undressing Lower body dressing/undressing steps patient completed: Thread/unthread right pants leg;Thread/unthread left pants leg;Fasten/unfasten left shoe;Don/Doff left shoe Lower body dressing/undressing: 3: Mod-Patient completed 50-74% of tasks  FIM - Toileting Toileting steps completed by patient: Adjust clothing prior to toileting Toileting Assistive Devices: Grab bar or rail for support Toileting: 2: Max-Patient completed 1 of 3 steps  FIM - Radio producer Devices: Grab bars Toilet Transfers: 4-To toilet/BSC: Min A (steadying Pt. > 75%);4-From toilet/BSC: Min A (steadying Pt. > 75%)  FIM - Bed/Chair Transfer Bed/Chair Transfer Assistive Devices: Arm rests Bed/Chair Transfer: 4: Bed > Chair or W/C: Min A (steadying Pt. > 75%);4: Chair or W/C > Bed: Min A (steadying Pt. > 75%)  FIM - Locomotion: Wheelchair Distance: 100 Locomotion: Wheelchair: 2: Travels 50 - 149 ft with supervision, cueing or coaxing FIM - Locomotion:  Ambulation Locomotion: Ambulation Assistive Devices: Walker - Rolling (hand orthosis) Ambulation/Gait Assistance: 3: Mod assist Locomotion: Ambulation: 3: Travels 150 ft or more with moderate assistance (Pt: 50 - 74%)  Comprehension Comprehension Mode: Auditory Comprehension: 5-Follows basic conversation/direction: With no assist  Expression Expression Mode: Verbal Expression: 3-Expresses basic 50 - 74% of the time/requires cueing 25 - 50% of the time. Needs to repeat parts of sentences.  Social Interaction Social Interaction: 4-Interacts appropriately 75 - 89% of the time - Needs redirection for appropriate language or to initiate interaction.  Problem Solving Problem Solving: 4-Solves basic 75 - 89% of the time/requires cueing 10 - 24% of the  time  Memory Memory: 4-Recognizes or recalls 75 - 89% of the time/requires cueing 10 - 24% of the time  Medical Problem List and Plan:  1. Left internal capsule/deep white matter infarcts felt to be SVD thrombotic    2. DVT Prophylaxis/Anticoagulation: Subcutaneous Lovenox. Monitor platelet counts and any signs of bleeding  3. Pain Management: Tylenol as needed  4. Mood/PTSD/depression. Wellbutrin 150 mg daily, BuSpar 5 mg twice a day as needed, Depakote 1000 mg each bedtime, effexor 150 mg once a day and trazodone 300 mg each bedtime -consult to neuropsych 5. Neuropsych: This patient is not yet capable of making decisions on his own behalf.  6. Diabetes mellitus with peripheral neuropathy.uncontrolled titrating Lantus --increase am dose   Hemoglobin A1c 7.4. Currently on sliding scale insulin.   Check blood sugars a.c. and at bedtime resume oral agents as directed   -still receiving much less insulin than home dose goal is >110,<180 7. Hypertension. Lisinopril 10 mg day. Monitor with increased activity  8. Hypothyroidism. Synthroid .TSH 4.045  9. Hyperlipidemia. Lipitor  10. GERD. Protonix  11. Left lateral lower extremity ulcer. Followup  wound care nurse with dressing changes as directed/Unna boot 12. Lethargy: improving .  LOS (Days) 14 A FACE TO FACE EVALUATION WAS PERFORMED  KIRSTEINS,ANDREW E 10/04/2013, 1:49 PM

## 2013-10-04 NOTE — Progress Notes (Signed)
Physical Therapy Note  Patient Details  Name: BROCK LARMON MRN: [id number] Date of Birth: 11-29-1950 Today's Date: 10/04/2013  [phone] (55 minutes) individual Pain: no reported pain Focus of treatment: gait training ; therapeutic activities focused on sit to stand with equal weight bearing bilateral LEs , right knee /hip extension control in stance Treatment: Sit to stand from mat focusing on preventing hip external rotation and maintaining equal weight bearing bilateral LEs 4 X 10 ; gait without AD 150 feet X 2 min assist to mod with one potential loss of balance with loss of concentration on activity); Kinetron in standing focusing on weight shifts and RT knee /hip control in stance.    Eivin Mascio,JIM 10/04/2013, 1:30 PM

## 2013-10-04 NOTE — Progress Notes (Signed)
Speech Language Pathology Daily Session Note  Patient Details  Name: Brad Taylor MRN: [id number] Date of Birth: 1951-07-19  Today's Date: 10/04/2013 Time: 1500-1530 Time Calculation (min): 30 min  Short Term Goals: Week 2: SLP Short Term Goal 1 (Week 2): Patient will utilize speech intelligibility strategies in conversation with Min vebral cues to self-monitor and correct errors. SLP Short Term Goal 2 (Week 2): Patient will utilize word finding strategies in conversation with Min verbal cues. SLP Short Term Goal 3 (Week 2): Patient will demonstrate problem solving with daily complex problems with Min verbal cues to self-monitor and correct errors.  Skilled Therapeutic Interventions: Skilled treatment session addressed cognitive goals. Patient required Mod verbal and questions cues to sequence picture cards of everyday activities from a field of 6 in a distracting environment. Patient required Max verbal cues to initiate task. Patient independently navigated to room. Continue with current plan of care.    FIM:  Comprehension Comprehension Mode: Auditory Comprehension: 5-Follows basic conversation/direction: With no assist Expression Expression Mode: Verbal Expression: 3-Expresses basic 50 - 74% of the time/requires cueing 25 - 50% of the time. Needs to repeat parts of sentences. Social Interaction Social Interaction: 5-Interacts appropriately 90% of the time - Needs monitoring or encouragement for participation or interaction. Problem Solving Problem Solving: 4-Solves basic 75 - 89% of the time/requires cueing 10 - 24% of the time Memory Memory: 4-Recognizes or recalls 75 - 89% of the time/requires cueing 10 - 24% of the time FIM - Eating Eating Activity: 0: Activity did not occur  Pain Pain Assessment Pain Assessment: No/denies pain  Therapy/Group: Individual Therapy  Jmichael Gille 10/04/2013, 4:05 PM

## 2013-10-04 NOTE — Progress Notes (Signed)
The skilled treatment note has been reviewed and SLP is in agreement.  Jerene Yeager, M.A., CCC-SLP  [phone]   

## 2013-10-04 NOTE — Progress Notes (Signed)
Inpatient Diabetes Program Recommendations  AACE/ADA: New Consensus Statement on Inpatient Glycemic Control (2013)  Target Ranges:  Prepandial:   less than 140 mg/dL      Peak postprandial:   less than 180 mg/dL (1-2 hours)      Critically ill patients:  140 - 180 mg/dL   Reason for Visit: Hyperglycemia  Results for Brad, Taylor (MRN [id number]) as of 10/04/2013 12:34  Ref. Range 10/03/2013 11:30 10/03/2013 16:29 10/03/2013 20:41 10/04/2013 07:31 10/04/2013 12:04  Glucose-Capillary Latest Range: 70-99 mg/dL 234 (H) 192 (H) 221 (H) 161 (H) 196 (H)   Post-prandial hyperglycemia.    *Pt would benefit from addition of meal coverage insulin - Novolog 3 units tidwc if pt eats >50% meal.  Will continue to follow. Thank you. Lorenda Peck, RD, LDN, CDE Inpatient Diabetes Coordinator [phone]

## 2013-10-04 NOTE — Progress Notes (Signed)
Occupational Therapy Session Note  Patient Details  Name: Brad Taylor MRN: [id number] Date of Birth: 1951-02-01  Today's Date: 10/04/2013 Time: 1000-1100 Time Calculation (min): 60 min  Short Term Goals: Week 1:  OT Short Term Goal 1 (Week 1): Pt will perform all bathing with min assist using AE for sit to stand.   OT Short Term Goal 1 - Progress (Week 1): Not met OT Short Term Goal 2 (Week 1): Pt will perform all bathing with min assist using AE for sit to stand.   OT Short Term Goal 2 - Progress (Week 1): Not met OT Short Term Goal 3 (Week 1): Pt will donn pants and shoes with AE and min assist. OT Short Term Goal 3 - Progress (Week 1): Not met OT Short Term Goal 4 (Week 1): Pt will perform toilet transfers with supervision. OT Short Term Goal 4 - Progress (Week 1): Not met OT Short Term Goal 5 (Week 1): Pt/family will return demonstrate AAROM/PROM exercises for the RUE. OT Short Term Goal 5 - Progress (Week 1): Met Week 2:  OT Short Term Goal 1 (Week 2): Pt will perform all bathing with min assist using AE for sit to stand.   OT Short Term Goal 2 (Week 2): Pt will perform all bathing with min assist using AE for sit to stand.   OT Short Term Goal 3 (Week 2): Pt will donn pants and shoes with AE and min assist. OT Short Term Goal 4 (Week 2): Pt will perform toilet transfers with supervision. OT Short Term Goal 5 (Week 2): Pt will use the RUE with holding washcloth and objects to open during B/D tasks with min assist.        Skilled Therapeutic Interventions/Progress Updates:    Pt seen for BADL retraining of B/D at sink level with a focus on adaptive techniques using AE, use of RUE as a stabilizer, and standing balance.  Pt used long sponge to reach L arm and buttocks. In standing, pt needed cues to fully extend RLE as he began flexing his knee and leaning to the right. Pt used reacher to don pants over feet and pulled his pants over hips 80% of the way.  He used his R arm to  stabilize his L shoe against his trunk while unfastening it.  Pt has increased edema in hand. His sister had arrived and provided education on positioning, retrograde massage, and arom. Pt received retrograde massage, positioned his arm on lap tray, pt then worked on active finger flexion with towel squeezes with assist to extend his fingers and active elbow flexion in gravity eliminated position with assist to fully extend elbow.  Instructed pt to continue these exercises.  Pt resting in w/c with family in his room.    Therapy Documentation Precautions:  Precautions Precautions: Fall Precaution Comments: R sided weakness (RUE>RLE), dysarthria Restrictions Weight Bearing Restrictions: No      Pain: Pain Assessment Pain Assessment: No/denies pain ADL:  See FIM for current functional status  Therapy/Group: Individual Therapy  [place] 10/04/2013, 12:12 PM

## 2013-10-05 ENCOUNTER — Inpatient Hospital Stay (HOSPITAL_COMMUNITY): Payer: Medicare HMO | Admitting: Rehabilitation

## 2013-10-05 ENCOUNTER — Inpatient Hospital Stay (HOSPITAL_COMMUNITY): Payer: Medicare HMO | Admitting: Speech Pathology

## 2013-10-05 ENCOUNTER — Inpatient Hospital Stay (HOSPITAL_COMMUNITY): Payer: Medicare HMO | Admitting: Occupational Therapy

## 2013-10-05 ENCOUNTER — Encounter (HOSPITAL_COMMUNITY): Payer: Medicare HMO | Admitting: Occupational Therapy

## 2013-10-05 DIAGNOSIS — G8110 Spastic hemiplegia affecting unspecified side: Secondary | ICD-10-CM

## 2013-10-05 DIAGNOSIS — E1149 Type 2 diabetes mellitus with other diabetic neurological complication: Secondary | ICD-10-CM

## 2013-10-05 DIAGNOSIS — I6340 Cerebral infarction due to embolism of unspecified cerebral artery: Secondary | ICD-10-CM

## 2013-10-05 DIAGNOSIS — E1142 Type 2 diabetes mellitus with diabetic polyneuropathy: Secondary | ICD-10-CM

## 2013-10-05 DIAGNOSIS — J449 Chronic obstructive pulmonary disease, unspecified: Secondary | ICD-10-CM

## 2013-10-05 LAB — GLUCOSE, CAPILLARY
GLUCOSE-CAPILLARY: 177 mg/dL — AB (ref 70–99)
Glucose-Capillary: 121 mg/dL — ABNORMAL HIGH (ref 70–99)
Glucose-Capillary: 192 mg/dL — ABNORMAL HIGH (ref 70–99)
Glucose-Capillary: 196 mg/dL — ABNORMAL HIGH (ref 70–99)

## 2013-10-05 NOTE — Progress Notes (Signed)
Occupational Therapy Session Note  Patient Details  Name: Brad Taylor MRN: [id number] Date of Birth: [birth year]  Today's Date: 10/05/2013 Time: 1100-1159 Time Calculation (min): 59 min  Short Term Goals: Week 2:  OT Short Term Goal 1 (Week 2): Pt will perform all bathing with min assist using AE for sit to stand.   OT Short Term Goal 2 (Week 2): Pt will perform all bathing with min assist using AE for sit to stand.   OT Short Term Goal 3 (Week 2): Pt will donn pants and shoes with AE and min assist. OT Short Term Goal 4 (Week 2): Pt will perform toilet transfers with supervision. OT Short Term Goal 5 (Week 2): Pt will use the RUE with holding washcloth and objects to open during B/D tasks with min assist.    Skilled Therapeutic Interventions/Progress Updates:    Pt finished bathing and LB dressing during session.  His wife assisted him with UB bathing and dressing prior to session.  Pt needed mod assist to remove his shoes and right AFO.  He then was able to utilize the reacher with increased time to remove his socks.  He stood with min guard assist for washing his front peri area but needs mod assist for thoroughness to wash his buttocks.  Utilized LH sponge for washing his feet and the reacher to assist with drying them.  Pt needed min assist to utilize reacher for donning his pants over his feet as well as mod facilitation to pull them over his hips.  He was able to also donn the left shoe and fasten the velcro lace using the reacher but not able to perform donning the right with AFO.  Finished by having pt use the RW for functional mobility to finish session with min guard assist and mod instructional cueing to remember to place his right hand in his lap before sitting down as to not let the right hand crash down on the surface.    Therapy Documentation Precautions:  Precautions Precautions: Fall Precaution Comments: R sided weakness (RUE>RLE), dysarthria Restrictions Weight Bearing  Restrictions: No  Vital Signs: Therapy Vitals BP: 142/74 mmHg Pain: Pain Assessment Pain Assessment: No/denies pain ADL: See FIM for current functional status  Therapy/Group: Individual Therapy  Yanis Larin OTR/L 10/05/2013, 12:23 PM

## 2013-10-05 NOTE — Progress Notes (Signed)
The skilled treatment note has been reviewed and SLP is in agreement.  Kayley Zeiders, M.A., CCC-SLP  [phone]   

## 2013-10-05 NOTE — Progress Notes (Signed)
Physical Therapy Session Note  Patient Details  Name: Brad Taylor MRN: [id number] Date of Birth: [birth year]  Today's Date: 10/05/2013 Time: 0830-0927 Time Calculation (min): 57 min  Short Term Goals: Week 2:  PT Short Term Goal 1 (Week 2): =LTG's due to ELOS  Skilled Therapeutic Interventions/Progress Updates:   Pt received sitting in w/c in room, receiving meds from RN.  Assisted pt with donning pants by threading on BLEs and also threading catheter through pants.  Performed standing at supervision level with min cues for hand placement while therapist assisted with adjusting pants.  Pt sat back down in order for therapist to assist with donning shoes and R AFO for therapy session.  Once seated again, pt states he needs to use restroom, therefore ambulated to restroom with RW at min/guard to close supervision level with cues for keeping feet inside of RW, esp when turning.   Pt able to begin removing pants, however due to urgency, was not able to remove brief, therefore therapist assisted.  Also assist with peri care.  Pt ambulated to sink in order to wash hands.  Continue to note some decreased initiation of task and requires min cues for getting soap, then turning water on.  Pt then sat for rest break before ambulating to therapy gym (x 100') with supervision to min assist for occasional LOB.   Once in therapy gym performed ambulation negotiating around and over obstacles.  Requires supervision to min assist with cues for decreased gait speed and also maintaining position inside of RW, as well as sequencing when stepping over poles.  Continued to focus on ambulation during remainder of session down to [place] for more distracting environment, and ambulating over carpet in distracting environment.  Pt requires supervision except for one instance he required min assist when stepping over threshold onto carpet.  Pt ambulated >200' then requires seated rest break.  Ambulated back to room  at supervision level with continued cues for increased step length on RLE.  Pt returned to w/c in room.  Left in w/c with all needs in reach.   Therapy Documentation Precautions:  Precautions Precautions: Fall Precaution Comments: R sided weakness (RUE>RLE), dysarthria Restrictions Weight Bearing Restrictions: No   Vital Signs: Therapy Vitals Temp: 98 F (36.7 C) Temp src: Oral Pulse Rate: 55 Resp: 18 BP: 142/74 mmHg Patient Position, if appropriate: Lying Oxygen Therapy SpO2: 92 % O2 Device: None (Room air) Pain: Pain in L lower back.  RN notified.    Locomotion : Ambulation Ambulation/Gait Assistance: 5: Supervision;4: Min guard   See FIM for current functional status  Therapy/Group: Individual Therapy  Denice Bors 10/05/2013, 9:28 AM

## 2013-10-05 NOTE — Progress Notes (Signed)
Occupational Therapy Session Note  Patient Details  Name: Brad Taylor MRN: [id number] Date of Birth: [birth year]  Today's Date: 10/05/2013 Time: 7948-0165 Time Calculation (min): 45 min  Short Term Goals: Week 2:  OT Short Term Goal 1 (Week 2): Pt will perform all bathing with min assist using AE for sit to stand.   OT Short Term Goal 2 (Week 2): Pt will perform all bathing with min assist using AE for sit to stand.   OT Short Term Goal 3 (Week 2): Pt will donn pants and shoes with AE and min assist. OT Short Term Goal 4 (Week 2): Pt will perform toilet transfers with supervision. OT Short Term Goal 5 (Week 2): Pt will use the RUE with holding washcloth and objects to open during B/D tasks with min assist.    Skilled Therapeutic Interventions/Progress Updates:    Pt performed neuromuscular re-education for the RUE during session.  Applied NMES to digit extensors during session while having pt work on active digit flexion without stimulation.  Progressed to working on functional reach with facilitation of extensors to help with release of small cups once he was able to grab one.  Needed mod facilitation at the shoulder to control reach however.  NMES for 20 mins with intensity set at level 60 with PPS at 50 and pulse width at 300.  Pt with no adverse reactions to stimuli.  Finished session by having pt working on activation of shoulder flexion on flat board in order to push bean bags off of the end of the board and into a container.  Board was placed beside of him on the therapy mat and washcloth was also placed under his hand to help reduce friction.  Pt needed mod facilitation to keep he arm from internally rotating while attempting shoulder flexion.  Therapy Documentation Precautions:  Precautions Precautions: Fall Precaution Comments: R sided weakness (RUE>RLE), dysarthria Restrictions Weight Bearing Restrictions: No  Pain: Pain Assessment Pain Assessment: No/denies pain ADL: See  FIM for current functional status  Therapy/Group: Individual Therapy  Monti Villers OTR/L 10/05/2013, 3:25 PM

## 2013-10-05 NOTE — Progress Notes (Signed)
Subjective/Complaints: 63 y.o. male with history of COPD, diabetes mellitus and peripheral neuropathy, PTSD. Admitted 09/15/2013 with right-sided weakness and slurred speech. MRI of the brain showed acute ischemic infarct involving the posterior limb of the left internal capsule as well as additional subcentimeter ischemic infarct deep white matter of the left centrum semiovale and remote lacunar infarct bilateral basal ganglia.   Pt asking to go home Friday or sooner   Review of Systems - Weak on R side, feels chilly  Objective: Vital Signs: Blood pressure 149/76, pulse 55, temperature 98 F (36.7 C), temperature source Oral, resp. rate 18, weight 125.4 kg (276 lb 7.3 oz), SpO2 92.00%. No results found. Results for orders placed during the hospital encounter of 09/20/13 (from the past 72 hour(s))  GLUCOSE, CAPILLARY     Status: Abnormal   Collection Time    10/02/13  7:16 AM      Result Value Ref Range   Glucose-Capillary 158 (*) 70 - 99 mg/dL   Comment 1 Notify RN    GLUCOSE, CAPILLARY     Status: Abnormal   Collection Time    10/02/13 11:27 AM      Result Value Ref Range   Glucose-Capillary 202 (*) 70 - 99 mg/dL   Comment 1 Notify RN    GLUCOSE, CAPILLARY     Status: Abnormal   Collection Time    10/02/13  4:29 PM      Result Value Ref Range   Glucose-Capillary 198 (*) 70 - 99 mg/dL   Comment 1 Notify RN    GLUCOSE, CAPILLARY     Status: Abnormal   Collection Time    10/02/13  8:22 PM      Result Value Ref Range   Glucose-Capillary 228 (*) 70 - 99 mg/dL   Comment 1 Notify RN    GLUCOSE, CAPILLARY     Status: Abnormal   Collection Time    10/03/13  7:10 AM      Result Value Ref Range   Glucose-Capillary 200 (*) 70 - 99 mg/dL   Comment 1 Notify RN    GLUCOSE, CAPILLARY     Status: Abnormal   Collection Time    10/03/13 11:30 AM      Result Value Ref Range   Glucose-Capillary 234 (*) 70 - 99 mg/dL   Comment 1 Notify RN    GLUCOSE, CAPILLARY     Status: Abnormal    Collection Time    10/03/13  4:29 PM      Result Value Ref Range   Glucose-Capillary 192 (*) 70 - 99 mg/dL   Comment 1 Notify RN    GLUCOSE, CAPILLARY     Status: Abnormal   Collection Time    10/03/13  8:41 PM      Result Value Ref Range   Glucose-Capillary 221 (*) 70 - 99 mg/dL  GLUCOSE, CAPILLARY     Status: Abnormal   Collection Time    10/04/13  7:31 AM      Result Value Ref Range   Glucose-Capillary 161 (*) 70 - 99 mg/dL   Comment 1 Notify RN    GLUCOSE, CAPILLARY     Status: Abnormal   Collection Time    10/04/13 12:04 PM      Result Value Ref Range   Glucose-Capillary 196 (*) 70 - 99 mg/dL   Comment 1 Notify RN    GLUCOSE, CAPILLARY     Status: Abnormal   Collection Time    10/04/13  4:20 PM  Result Value Ref Range   Glucose-Capillary 237 (*) 70 - 99 mg/dL   Comment 1 Notify RN    GLUCOSE, CAPILLARY     Status: Abnormal   Collection Time    10/04/13  9:14 PM      Result Value Ref Range   Glucose-Capillary 177 (*) 70 - 99 mg/dL       General: No acute distress Mood and affect are appropriate Heart: Regular rate and rhythm no rubs murmurs or extra sounds Lungs: Clear to auscultation, breathing unlabored, no rales or wheezes, poor inspiratory effort Abdomen: Positive bowel sounds, soft nontender to palpation, nondistended Extremities: No clubbing, cyanosis, or edema Skin: No evidence of breakdown, no evidence of rash Neurologic: pt is more arousable. Could tell me he was at [facility]. Voice clearer. Cranial nerves II through XII intact, motor strength is 5/5 in left deltoid, bicep, tricep, grip, hip flexor, knee extensors, ankle dorsiflexor and plantar flexor, 3- RUE,  Musculoskeletal: Full range of motion in all 4 extremities. No joint swelling  Assessment/Plan: 1. Functional deficits secondary to embolic left internal capsule/deep white matter infarct   which require 3+ hours per day of interdisciplinary therapy in a comprehensive inpatient rehab  setting. Physiatrist is providing close team supervision and 24 hour management of active medical problems listed below. Physiatrist and rehab team continue to assess barriers to discharge/monitor patient progress toward functional and medical goals. FIM: FIM - Bathing Bathing Steps Patient Completed: Chest;Abdomen;Front perineal area;Right upper leg;Left upper leg;Right Arm;Right lower leg (including foot);Left lower leg (including foot);Left Arm;Buttocks (used long handled sponge for left arm and buttocks) Bathing: 4: Steadying assist  FIM - Upper Body Dressing/Undressing Upper body dressing/undressing steps patient completed: Thread/unthread left sleeve of pullover shirt/dress;Put head through opening of pull over shirt/dress;Pull shirt over trunk Upper body dressing/undressing: 4: Min-Patient completed 75 plus % of tasks FIM - Lower Body Dressing/Undressing Lower body dressing/undressing steps patient completed: Thread/unthread right pants leg;Thread/unthread left pants leg;Fasten/unfasten left shoe;Don/Doff left shoe Lower body dressing/undressing: 3: Mod-Patient completed 50-74% of tasks  FIM - Toileting Toileting steps completed by patient: Adjust clothing prior to toileting Toileting Assistive Devices: Grab bar or rail for support Toileting: 2: Max-Patient completed 1 of 3 steps  FIM - Radio producer Devices: Grab bars Toilet Transfers: 4-To toilet/BSC: Min A (steadying Pt. > 75%);4-From toilet/BSC: Min A (steadying Pt. > 75%)  FIM - Bed/Chair Transfer Bed/Chair Transfer Assistive Devices: Arm rests Bed/Chair Transfer: 4: Bed > Chair or W/C: Min A (steadying Pt. > 75%);4: Chair or W/C > Bed: Min A (steadying Pt. > 75%)  FIM - Locomotion: Wheelchair Distance: 100 Locomotion: Wheelchair: 2: Travels 50 - 149 ft with supervision, cueing or coaxing FIM - Locomotion: Ambulation Locomotion: Ambulation Assistive Devices: Walker - Rolling (hand  orthosis) Ambulation/Gait Assistance: 3: Mod assist Locomotion: Ambulation: 3: Travels 150 ft or more with moderate assistance (Pt: 50 - 74%)  Comprehension Comprehension Mode: Auditory Comprehension: 5-Follows basic conversation/direction: With no assist  Expression Expression Mode: Verbal Expression: 3-Expresses basic 50 - 74% of the time/requires cueing 25 - 50% of the time. Needs to repeat parts of sentences.  Social Interaction Social Interaction: 5-Interacts appropriately 90% of the time - Needs monitoring or encouragement for participation or interaction.  Problem Solving Problem Solving: 4-Solves basic 75 - 89% of the time/requires cueing 10 - 24% of the time  Memory Memory: 4-Recognizes or recalls 75 - 89% of the time/requires cueing 10 - 24% of the time  Medical Problem List  and Plan:  1. Left internal capsule/deep white matter infarcts felt to be SVD thrombotic    2. DVT Prophylaxis/Anticoagulation: Subcutaneous Lovenox. Monitor platelet counts and any signs of bleeding  3. Pain Management: Tylenol as needed  4. Mood/PTSD/depression. Wellbutrin 150 mg daily, BuSpar 5 mg twice a day as needed, Depakote 1000 mg each bedtime, effexor 150 mg once a day and trazodone 300 mg each bedtime -consult to neuropsych 5. Neuropsych: This patient is not yet capable of making decisions on his own behalf.  6. Diabetes mellitus with peripheral neuropathy.uncontrolled titrating Lantus --increase am dose   Hemoglobin A1c 7.4. Currently on sliding scale insulin.   Check blood sugars a.c. and at bedtime resume oral agents as directed   -still receiving much less insulin than home dose goal is >110,<180 7. Hypertension. Lisinopril 10 mg day. Monitor with increased activity  8. Hypothyroidism. Synthroid .TSH 4.045  9. Hyperlipidemia. Lipitor  10. GERD. Protonix  11. Left lateral lower extremity ulcer. Followup wound care nurse with dressing changes as directed/Unna boot  .  LOS (Days) 15 A  FACE TO FACE EVALUATION WAS PERFORMED  Shauntavia Brackin E 10/05/2013, 6:43 AM

## 2013-10-05 NOTE — Progress Notes (Signed)
Speech Language Pathology Daily Session Note  Patient Details  Name: Brad Taylor MRN: [id number] Date of Birth: Sep 29, 1950  Today's Date: 10/05/2013 Time: 1400-1430 Time Calculation (min): 30 min  Short Term Goals: Week 2: SLP Short Term Goal 1 (Week 2): Patient will utilize speech intelligibility strategies in conversation with Min vebral cues to self-monitor and correct errors. SLP Short Term Goal 2 (Week 2): Patient will utilize word finding strategies in conversation with Min verbal cues. SLP Short Term Goal 3 (Week 2): Patient will demonstrate problem solving with daily complex problems with Min verbal cues to self-monitor and correct errors.  Skilled Therapeutic Interventions: Skilled treatment session addressed cognitive goals. Patient required Max verbal/visual cues fading to Mod visual cues to give appropriate directions using a map. Patient needed Mod visual cues to sequence directions and Mod question cues to for functional problem solving with navigation task. Continue with current plan of care.    FIM:  Comprehension Comprehension Mode: Auditory Comprehension: 5-Understands basic 90% of the time/requires cueing < 10% of the time Expression Expression Mode: Verbal Expression: 3-Expresses basic 50 - 74% of the time/requires cueing 25 - 50% of the time. Needs to repeat parts of sentences. Social Interaction Social Interaction: 6-Interacts appropriately with others with medication or extra time (anti-anxiety, antidepressant). Problem Solving Problem Solving: 4-Solves basic 75 - 89% of the time/requires cueing 10 - 24% of the time Memory Memory: 4-Recognizes or recalls 75 - 89% of the time/requires cueing 10 - 24% of the time FIM - Eating Eating Activity: 0: Activity did not occur  Pain Pain Assessment Pain Assessment: No/denies pain  Therapy/Group: Individual Therapy  Brad Taylor 10/05/2013, 2:56 PM

## 2013-10-06 ENCOUNTER — Inpatient Hospital Stay (HOSPITAL_COMMUNITY): Payer: Medicare HMO | Admitting: Rehabilitation

## 2013-10-06 ENCOUNTER — Inpatient Hospital Stay (HOSPITAL_COMMUNITY): Payer: Medicare HMO | Admitting: Occupational Therapy

## 2013-10-06 ENCOUNTER — Inpatient Hospital Stay (HOSPITAL_COMMUNITY): Payer: Medicare HMO

## 2013-10-06 ENCOUNTER — Inpatient Hospital Stay (HOSPITAL_COMMUNITY): Payer: Medicare HMO | Admitting: Speech Pathology

## 2013-10-06 LAB — GLUCOSE, CAPILLARY
Glucose-Capillary: 140 mg/dL — ABNORMAL HIGH (ref 70–99)
Glucose-Capillary: 168 mg/dL — ABNORMAL HIGH (ref 70–99)
Glucose-Capillary: 216 mg/dL — ABNORMAL HIGH (ref 70–99)
Glucose-Capillary: 191 mg/dL — ABNORMAL HIGH (ref 70–99)

## 2013-10-06 NOTE — Progress Notes (Signed)
The skilled treatment note has been reviewed and SLP is in agreement.  Matie Dimaano, M.A., CCC-SLP  [phone]   

## 2013-10-06 NOTE — Patient Care Conference (Signed)
Inpatient RehabilitationTeam Conference and Plan of Care Update Date: 10/06/2013   Time: 11:15 Am    Patient Name: Brad Taylor      Medical Record Number: [id number]  Date of Birth: July 13, 1951 Sex: Male         Room/Bed: 4M11C/4M11C-01 Payor Info: Payor: [employer] MEDICARE / Plan: [employer] MEDICARE HMO / Product Type: *No Product type* /    Admitting Diagnosis: CVA  Admit Date/Time:  09/20/2013  4:46 PM Admission Comments: No comment available   Primary Diagnosis:  <principal problem not specified> Principal Problem: <principal problem not specified>  Patient Active Problem List   Diagnosis Date Noted  . CVA (cerebral infarction) 09/20/2013  . Stroke 09/15/2013    Expected Discharge Date: Expected Discharge Date: 10/08/13  Team Members Present: Physician leading conference: Dr. Alysia Penna Social Worker Present: Alfonse Alpers, LCSW;Becky Jenavieve Freda, LCSW Nurse Present: Other (comment) Ignatius Specking Turner-RN) PT Present: Cameron Sprang, Cecille Rubin, PT OT Present: Antony Salmon, Jules Schick, OT SLP Present: Germain Osgood, SLP PPS Coordinator present : Ileana Ladd, Lelan Pons, RN, CRRN     Current Status/Progress Goal Weekly Team Focus  Medical   improved participation,amb with sup  maximize fxn for D/C  family training   Bowel/Bladder   Patient incontinent of bowel and bladder, wears a brief and condom cath hs  Continent of bowel and bladder  Continue timed toileting q 2-3 during day and condom cath hs   Swallow/Nutrition/ Hydration   regular and thin with full supervision  least restrictive PO intake with intermittent supervision   ability to sustain attention    ADL's   Pt is min assist UB selfcare and min to mod assist for LB selfcare sit to stand with use of AE.  Min assist for transfers stand pivot using the RW with hand splint for support.  He is able to elicit approximately 31% gross grasp and minimal release.  Brunnstrum stage III in the right shoulder and  elbow  overall supervision for transfers, toilet hygiene, UB dressing, min assist for UB bathing and LB dressing  selfcare retaining, RUE coordination and functional use, NMES for the RUE, standing balance, pt/family education.   Mobility   Pt is currently min/mod assist for bed mobility, min/guard to supervision for standing and transfers, supervision to min assist for gait with RW and R hand orthosis, esp when in busier environment or on carpeting environment.    Supervision overall  NMR for RUE/LE, gait, balance, coordination, safety, pt/family education   Communication   Min assist for use of intelligibility strategies  Supervision   increase use of intelligibility strategies in conversation   Safety/Cognition/ Behavioral Observations  Mod/Max assist with complex tasks  Supervision with complex tasks  increase problem solving abilities for complex, functional tasks    Pain   no c/o pain         Skin   Diabetic ulcer to LLE with 4x4 and coban dressing, wife changes q2 day  No new skin breakdown/infection  Assess skin q shift and prn      *See Care Plan and progress notes for long and short-term goals.  Barriers to Discharge: see above    Possible Resolutions to Barriers:  ready for D/C early 3/6    Discharge Planning/Teaching Needs:  Home with wife who is here and provide 24 hr care, pt wants to leave earlier than next Tuesday      Team Discussion:  Pt making progress toward goals and will emet sooner than stated discharge  date.  Wife and pt request discharge Friday.  Will do family education until then.  Aware of his distractability and need to be safe with this.  Revisions to Treatment Plan:  Moved up discharge to Friday per pt and family request   Continued Need for Acute Rehabilitation Level of Care: The patient requires daily medical management by a physician with specialized training in physical medicine and rehabilitation for the following conditions: Daily direction of a  multidisciplinary physical rehabilitation program to ensure safe treatment while eliciting the highest outcome that is of practical value to the patient.: Yes Daily medical management of patient stability for increased activity during participation in an intensive rehabilitation regime.: Yes Daily analysis of laboratory values and/or radiology reports with any subsequent need for medication adjustment of medical intervention for : Neurological problems;Other  Arzu Mcgaughey, Gardiner Rhyme 10/06/2013, 1:49 PM

## 2013-10-06 NOTE — Progress Notes (Signed)
Occupational Therapy Session Note  Patient Details  Name: SAHEJ SCHRIEBER MRN: [id number] Date of Birth: 05-01-1951  Today's Date: 10/06/2013 Time: 1000-1100 Time Calculation (min): 60 min  Short Term Goals: Week 1:  OT Short Term Goal 1 (Week 1): Pt will perform all bathing with min assist using AE for sit to stand.   OT Short Term Goal 1 - Progress (Week 1): Not met OT Short Term Goal 2 (Week 1): Pt will perform all bathing with min assist using AE for sit to stand.   OT Short Term Goal 2 - Progress (Week 1): Not met OT Short Term Goal 3 (Week 1): Pt will donn pants and shoes with AE and min assist. OT Short Term Goal 3 - Progress (Week 1): Not met OT Short Term Goal 4 (Week 1): Pt will perform toilet transfers with supervision. OT Short Term Goal 4 - Progress (Week 1): Not met OT Short Term Goal 5 (Week 1): Pt/family will return demonstrate AAROM/PROM exercises for the RUE. OT Short Term Goal 5 - Progress (Week 1): Met Week 2:  OT Short Term Goal 1 (Week 2): Pt will perform all bathing with min assist using AE for sit to stand.   OT Short Term Goal 2 (Week 2): Pt will perform all bathing with min assist using AE for sit to stand.   OT Short Term Goal 3 (Week 2): Pt will donn pants and shoes with AE and min assist. OT Short Term Goal 4 (Week 2): Pt will perform toilet transfers with supervision. OT Short Term Goal 5 (Week 2): Pt will use the RUE with holding washcloth and objects to open during B/D tasks with min assist.    Skilled Therapeutic Interventions/Progress Updates:    Pt scheduled for B/D this am, but pt opted to pass as he was already dressed. Pt's wife present for family education so reviewed his progress with self care and worked on toilet transfers from w/c and with RW 2x.  His wife practice providing close supervision with both transfers.  Pt then engaged in RUE neuro re-ed with a/arom exercises in various positions of gravity eliminated on table, use of UE ranger, standing  at counter for weight bearing, pushing off counter, and active movements of moving washcloth on counter. Reviewed several home activities pt can do with his wife to increase RUE movement.  Discussed plan to do full bath and dress tomorrow with wife. Pt resting in w/c at end of session with call light in place.  Therapy Documentation Precautions:  Precautions Precautions: Fall Precaution Comments: R sided weakness (RUE>RLE), dysarthria Restrictions Weight Bearing Restrictions: No   Pain: Pain Assessment Pain Assessment: No/denies pain ADL:   See FIM for current functional status  Therapy/Group: Individual Therapy  [place] 10/06/2013, 12:25 PM

## 2013-10-06 NOTE — Progress Notes (Signed)
Speech Language Pathology Daily Session Note  Patient Details  Name: Brad Taylor MRN: [id number] Date of Birth: 05/28/1951  Today's Date: 10/06/2013 Time: 1345-1415 Time Calculation (min): 30 min  Short Term Goals: Week 2: SLP Short Term Goal 1 (Week 2): Patient will utilize speech intelligibility strategies in conversation with Min vebral cues to self-monitor and correct errors. SLP Short Term Goal 2 (Week 2): Patient will utilize word finding strategies in conversation with Min verbal cues. SLP Short Term Goal 3 (Week 2): Patient will demonstrate problem solving with daily complex problems with Min verbal cues to self-monitor and correct errors.  Skilled Therapeutic Interventions: Skilled treatment session addressed cognitive goals. Patient required Mod multimodal cues to problem solve and request assistance to open pill bottles during a mildly complex medication management task. Continue with current plan of care.    FIM:  Comprehension Comprehension Mode: Auditory Comprehension: 5-Follows basic conversation/direction: With no assist Expression Expression Mode: Verbal Expression: 3-Expresses basic 50 - 74% of the time/requires cueing 25 - 50% of the time. Needs to repeat parts of sentences. Social Interaction Social Interaction: 6-Interacts appropriately with others with medication or extra time (anti-anxiety, antidepressant). Problem Solving Problem Solving: 4-Solves basic 75 - 89% of the time/requires cueing 10 - 24% of the time Memory Memory: 5-Recognizes or recalls 90% of the time/requires cueing < 10% of the time FIM - Eating Eating Activity: 0: Activity did not occur  Pain Pain Assessment Pain Assessment: No/denies pain  Therapy/Group: Individual Therapy  Chelci [place] 10/06/2013, 4:03 PM

## 2013-10-06 NOTE — Progress Notes (Signed)
Social Work Patient ID: Brad Taylor, male   DOB: 31-Jul-1951, 63 y.o.   MRN: [id number] Met with pt and wife to discuss team conference and inform them discharge has been moved up to this Friday per their request. Wife has begun family education and feels by Friday she will be ready for discharge Friday.  Discussed DME and follow up therapies Will prepare for discharge Friday.

## 2013-10-06 NOTE — Progress Notes (Addendum)
Physical Therapy Note  Patient Details  Name: Brad Taylor MRN: [id number] Date of Birth: July 30, 1951 Today's Date: 10/06/2013 1440-1525; 45 min individual therapy No pain reported  Tx focused on further family ed with wife, gait training, neuromuscular re-education RUE and RLE  Simulated car transfer x 2, including once with wife, safely, with supervision,  VCs for foot placement, safe supports for hands.  neuromuscular re-education via demo, VCS, tactile cues for eccentric control stand> sit, bil hip/knee alignment in sitting and standing, mini squats in standing 10 x 1 , sit>< stand using Bobath method with bil UEs flexed forward, RUE as gross assist during bil UE task of folding towels on table, pt sitting.   Gait with RW x 150' min guard assist performed by wife.  Recliner> bed to L stand step transfer performed by wife, min guard assist.  Bed mobility with supervision.  Purvi Ruehl 10/06/2013, 12:31 PM

## 2013-10-06 NOTE — Progress Notes (Signed)
Physical Therapy Weekly Progress Note  Patient Details  Name: Brad Taylor MRN: [id number] Date of Birth: 1951/07/04  Today's Date: 10/06/2013 Time: 6546-5035 Time Calculation (min): 55 min  Patient has met 3 of 10 long term goals.  Short term goals not set due to estimated length of stay.  Pt making good progress towards remainder of LTG's and is able to ambulate in controlled environment at supervision most of time, however at times requires min assist to prevent LOB due to dragging RLE or letting RW get too far ahead of him, esp when making turns or when distracted by environment.  Will begin to perform floor transfers today to determine safety of pt to be able to perform at home, otherwise will D/C goal and have pt/wife call 911 in case of fall.  Will also begin getting wife involved in pt care during sessions.    Patient continues to demonstrate the following deficits: decreased attention, decreased functional use of RUE, decreased strength in RLE, decreased balance, gait abnormality, decreased awareness and therefore will continue to benefit from skilled PT intervention to enhance overall performance with activity tolerance, balance, ability to compensate for deficits, functional use of  right upper extremity and right lower extremity, attention, awareness, coordination and knowledge of precautions.  See Patient's Care Plan for progression toward long term goals.  Patient progressing toward long term goals..  Continue plan of care.  Skilled Therapeutic Interventions/Progress Updates:   Pt received sitting in w/c in room, requesting to don pants and shoes prior to session.  Assisted pt with donning pants, however when pt stood up (at supervision level), note that brief had been soiled mildly, therefore assisted with cleaning and donning new brief.  Pt able to stand x 6 mins in order to allow therapist to assist with these tasks.  Pt then with c/o weakness in RLE and needed to sit and rest.   Allowed pt to sit and rest while therapist assisted with donning RLE/AFO.  Pt able to don LLE on his own.  Pt then ambulated to ADL apt then therapy gym (>150' total) at supervision level with RW and R hand splint.  Continues to demonstrate mild LOB at times, however pt doing better with stopping and self correcting before complete LOB.  While in ADL apt, performed bed mobility on regular bed to better simulate home environment. Pt able to perform at supervision level.  Ambulated to therapy gym in order to perform floor transfer in case of fall at home.  Discussed when to attempt to get up vs when to call for assist from 911.  Pt verbalized understanding.  Performed floor transfer sitting>tall kneeling (requires assist for getting RLE into better position)>modified quadruped>SL>supine and vice versa.  Requires very minimal assist for RUE during transfer and min/mod assist to elevate hips back into quadruped and tall kneeling.  Ended session with stair negotiation sideways with use of R handrail at min assist with mod verbal cues for appropriate stepping technique.  Pt returned to room with RW at supervision level.  Pt left in w/c with all needs in reach.    Therapy Documentation Precautions:  Precautions Precautions: Fall Precaution Comments: R sided weakness (RUE>RLE), dysarthria Restrictions Weight Bearing Restrictions: No     Pain: Pt with some pain in RLE during session, better with rest.  See FIM for current functional status  Therapy/Group: Individual Therapy  Denice Bors 10/06/2013, 11:36 AM

## 2013-10-06 NOTE — Progress Notes (Signed)
Subjective/Complaints: 63 y.o. male with history of COPD, diabetes mellitus and peripheral neuropathy, PTSD. Admitted 09/15/2013 with right-sided weakness and slurred speech. MRI of the brain showed acute ischemic infarct involving the posterior limb of the left internal capsule as well as additional subcentimeter ischemic infarct deep white matter of the left centrum semiovale and remote lacunar infarct bilateral basal ganglia.   Pt asking to go home Friday , discussed with PT pt has had problems with distractibility   Review of Systems - Weak on R side, feels chilly  Objective: Vital Signs: Blood pressure 135/76, pulse 72, temperature 97.7 F (36.5 C), temperature source Oral, resp. rate 19, weight 126.3 kg (278 lb 7.1 oz), SpO2 95.00%. No results found. Results for orders placed during the hospital encounter of 09/20/13 (from the past 72 hour(s))  GLUCOSE, CAPILLARY     Status: Abnormal   Collection Time    10/03/13 11:30 AM      Result Value Ref Range   Glucose-Capillary 234 (*) 70 - 99 mg/dL   Comment 1 Notify RN    GLUCOSE, CAPILLARY     Status: Abnormal   Collection Time    10/03/13  4:29 PM      Result Value Ref Range   Glucose-Capillary 192 (*) 70 - 99 mg/dL   Comment 1 Notify RN    GLUCOSE, CAPILLARY     Status: Abnormal   Collection Time    10/03/13  8:41 PM      Result Value Ref Range   Glucose-Capillary 221 (*) 70 - 99 mg/dL  GLUCOSE, CAPILLARY     Status: Abnormal   Collection Time    10/04/13  7:31 AM      Result Value Ref Range   Glucose-Capillary 161 (*) 70 - 99 mg/dL   Comment 1 Notify RN    GLUCOSE, CAPILLARY     Status: Abnormal   Collection Time    10/04/13 12:04 PM      Result Value Ref Range   Glucose-Capillary 196 (*) 70 - 99 mg/dL   Comment 1 Notify RN    GLUCOSE, CAPILLARY     Status: Abnormal   Collection Time    10/04/13  4:20 PM      Result Value Ref Range   Glucose-Capillary 237 (*) 70 - 99 mg/dL   Comment 1 Notify RN    GLUCOSE,  CAPILLARY     Status: Abnormal   Collection Time    10/04/13  9:14 PM      Result Value Ref Range   Glucose-Capillary 177 (*) 70 - 99 mg/dL  GLUCOSE, CAPILLARY     Status: Abnormal   Collection Time    10/05/13  7:19 AM      Result Value Ref Range   Glucose-Capillary 121 (*) 70 - 99 mg/dL  GLUCOSE, CAPILLARY     Status: Abnormal   Collection Time    10/05/13 12:07 PM      Result Value Ref Range   Glucose-Capillary 192 (*) 70 - 99 mg/dL  GLUCOSE, CAPILLARY     Status: Abnormal   Collection Time    10/05/13  4:41 PM      Result Value Ref Range   Glucose-Capillary 196 (*) 70 - 99 mg/dL  GLUCOSE, CAPILLARY     Status: Abnormal   Collection Time    10/05/13  9:11 PM      Result Value Ref Range   Glucose-Capillary 177 (*) 70 - 99 mg/dL  GLUCOSE, CAPILLARY  Status: Abnormal   Collection Time    10/06/13  7:31 AM      Result Value Ref Range   Glucose-Capillary 140 (*) 70 - 99 mg/dL   Comment 1 Notify RN         General: No acute distress Mood and affect are appropriate Heart: Regular rate and rhythm no rubs murmurs or extra sounds Lungs: Clear to auscultation, breathing unlabored, no rales or wheezes, poor inspiratory effort Abdomen: Positive bowel sounds, soft nontender to palpation, nondistended Extremities: No clubbing, cyanosis, or edema Skin: No evidence of breakdown, no evidence of rash Neurologic: pt is more arousable. Could tell me he was at [facility]. Voice clearer. Cranial nerves II through XII intact, motor strength is 5/5 in left deltoid, bicep, tricep, grip, hip flexor, knee extensors, ankle dorsiflexor and plantar flexor, 3- RUE,  Musculoskeletal: Full range of motion in all 4 extremities. No joint swelling  Assessment/Plan: 1. Functional deficits secondary to embolic left internal capsule/deep white matter infarct   which require 3+ hours per day of interdisciplinary therapy in a comprehensive inpatient rehab setting. Physiatrist is providing close team  supervision and 24 hour management of active medical problems listed below. Physiatrist and rehab team continue to assess barriers to discharge/monitor patient progress toward functional and medical goals. Team conference today please see physician documentation under team conference tab, met with team face-to-face to discuss problems,progress, and goals. Formulized individual treatment plan based on medical history, underlying problem and comorbidities. FIM: FIM - Bathing Bathing Steps Patient Completed: Front perineal area;Right upper leg;Left upper leg;Right lower leg (including foot);Left lower leg (including foot) Bathing: 4: Min-Patient completes 8-9 64f10 parts or 75+ percent  FIM - Upper Body Dressing/Undressing Upper body dressing/undressing steps patient completed: Thread/unthread left sleeve of pullover shirt/dress;Put head through opening of pull over shirt/dress;Pull shirt over trunk Upper body dressing/undressing: 0: Activity did not occur FIM - Lower Body Dressing/Undressing Lower body dressing/undressing steps patient completed: Don/Doff left shoe;Fasten/unfasten left shoe;Thread/unthread left pants leg;Don/Doff right sock;Don/Doff left sock Lower body dressing/undressing: 3: Mod-Patient completed 50-74% of tasks  FIM - Toileting Toileting steps completed by patient: Adjust clothing prior to toileting Toileting Assistive Devices: Grab bar or rail for support Toileting: 2: Max-Patient completed 1 of 3 steps  FIM - TRadio producerDevices: Grab bars Toilet Transfers: 4-To toilet/BSC: Min A (steadying Pt. > 75%);4-From toilet/BSC: Min A (steadying Pt. > 75%)  FIM - Bed/Chair Transfer Bed/Chair Transfer Assistive Devices: Arm rests Bed/Chair Transfer: 4: Bed > Chair or W/C: Min A (steadying Pt. > 75%);4: Chair or W/C > Bed: Min A (steadying Pt. > 75%)  FIM - Locomotion: Wheelchair Distance: 100 Locomotion: Wheelchair: 0: Activity did not  occur FIM - Locomotion: Ambulation Locomotion: Ambulation Assistive Devices: WAdministratorAmbulation/Gait Assistance: 5: Supervision;4: Min guard Locomotion: Ambulation: 4: Travels 150 ft or more with minimal assistance (Pt.>75%)  Comprehension Comprehension Mode: Auditory Comprehension: 5-Understands basic 90% of the time/requires cueing < 10% of the time  Expression Expression Mode: Verbal Expression: 3-Expresses basic 50 - 74% of the time/requires cueing 25 - 50% of the time. Needs to repeat parts of sentences.  Social Interaction Social Interaction: 6-Interacts appropriately with others with medication or extra time (anti-anxiety, antidepressant).  Problem Solving Problem Solving: 4-Solves basic 75 - 89% of the time/requires cueing 10 - 24% of the time  Memory Memory: 4-Recognizes or recalls 75 - 89% of the time/requires cueing 10 - 24% of the time  Medical Problem List and Plan:  1. Left internal capsule/deep white matter infarcts felt to be SVD thrombotic    2. DVT Prophylaxis/Anticoagulation: Subcutaneous Lovenox. Monitor platelet counts and any signs of bleeding  3. Pain Management: Tylenol as needed  4. Mood/PTSD/depression. Wellbutrin 150 mg daily, BuSpar 5 mg twice a day as needed, Depakote 1000 mg each bedtime, effexor 150 mg once a day and trazodone 300 mg each bedtime -consult to neuropsych 5. Neuropsych: This patient is not yet capable of making decisions on his own behalf.  6. Diabetes mellitus with peripheral neuropathy.uncontrolled titrating Lantus --increase am dose   Hemoglobin A1c 7.4. Currently on sliding scale insulin.   Check blood sugars a.c. and at bedtime resume oral agents as directed   -still receiving much less insulin than home dose goal is >110,<180 7. Hypertension. Lisinopril 10 mg day. Monitor with increased activity  8. Hypothyroidism. Synthroid .TSH 4.045  9. Hyperlipidemia. Lipitor  10. GERD. Protonix  11. Left lateral lower extremity ulcer.  Followup wound care nurse with dressing changes as directed/Unna boot  .  LOS (Days) [place] EVALUATION WAS PERFORMED  Orpah Hausner E 10/06/2013, 8:55 AM

## 2013-10-07 ENCOUNTER — Inpatient Hospital Stay (HOSPITAL_COMMUNITY): Payer: Medicare HMO | Admitting: Speech Pathology

## 2013-10-07 ENCOUNTER — Inpatient Hospital Stay (HOSPITAL_COMMUNITY): Payer: Medicare HMO | Admitting: Occupational Therapy

## 2013-10-07 ENCOUNTER — Inpatient Hospital Stay (HOSPITAL_COMMUNITY): Payer: Medicare HMO | Admitting: Rehabilitation

## 2013-10-07 LAB — GLUCOSE, CAPILLARY
GLUCOSE-CAPILLARY: 207 mg/dL — AB (ref 70–99)
Glucose-Capillary: 154 mg/dL — ABNORMAL HIGH (ref 70–99)
Glucose-Capillary: 221 mg/dL — ABNORMAL HIGH (ref 70–99)
Glucose-Capillary: 172 mg/dL — ABNORMAL HIGH (ref 70–99)

## 2013-10-07 MED ORDER — INSULIN GLARGINE 100 UNIT/ML ~~LOC~~ SOLN
30.0000 [IU] | Freq: Every day | SUBCUTANEOUS | Status: DC
  Administered 2013-10-07 – 2013-10-08 (×2): 30 [IU] via SUBCUTANEOUS
  Filled 2013-10-07 (×2): qty 0.3

## 2013-10-07 NOTE — Progress Notes (Signed)
Physical Therapy Session Note  Patient Details  Name: Brad Taylor MRN: [id number] Date of Birth: 08-04-1951  Today's Date: 10/07/2013 Time: 1420-1500 Time Calculation (min): 40 min  Short Term Goals: Week 3:   See LTG's  Skilled Therapeutic Interventions/Progress Updates:   Pt received sitting in w/c with wife present during session to perform family education.  Pt ambulated >150' to/from therapy gym and ADL apt (controlled and carpeted environment) at supervision level with wife walking with pt.  Educated pts wife on cues to provide for pt for maintaining feet inside RW, esp with turns and when easily distracted by environment.  Also educated on cues for when sitting/standing to ensure he scoots to front of chair and also to ensure that feet are in proper position prior to standing.  Wife verbalized understanding and assisted with cues during session.  Once in gym, performed stair negotiation with PT then with wife to ensure safety upon D/C.  Educated on getting to stairs with RW then reaching for rail, placing RW at top of stairs and having pt ascend/descend sideways.  Pt and wife return demonstration well during session.  Also performed floor transfer for pts wife to see amount of assist he would need if he did fall.  Pt continues to require min to mod assist for transfer with mod to max cues, therefore recommend that wife/family call 911 in case of fall.  Ended session with transfers, bed mobility and gait in ADL to better simulate home environment.  Wife asking about bed rail for pts bed as she fears he will toss himself into floor during the night.  Educated and had wife look at removable bed rail in ADL apt and that what she would get in a medical supply store may look similar.  Pt able to perform bed mobility at supervision and furniture transfer at supervision.  Pt ambulated back to room and was left in w/c. Okayed wife to assist pt in/out of restroom and with toileting.  RN made aware.     Therapy Documentation Precautions:  Precautions Precautions: Fall Precaution Comments: R sided weakness (RUE>RLE), dysarthria Restrictions Weight Bearing Restrictions: No   Vital Signs: Therapy Vitals Temp: 97.7 F (36.5 C) Temp src: Oral Pulse Rate: 60 Resp: 19 BP: 123/71 mmHg Patient Position, if appropriate: Lying Oxygen Therapy SpO2: 95 % O2 Device: None (Room air) Pain: Pt with c/o pain in L lower back Ambulation Ambulation/Gait Assistance: 5: Supervision   See FIM for current functional status  Therapy/Group: Individual Therapy  Denice Bors 10/07/2013, 4:30 PM

## 2013-10-07 NOTE — Plan of Care (Signed)
Problem: RH BLADDER ELIMINATION Goal: RH STG MANAGE BLADDER WITH ASSISTANCE STG Manage Bladder With Minimal Assistance  Outcome: Not Progressing Incontinent, not aware when incontinent, depends in use, condom cath at [facility]

## 2013-10-07 NOTE — Discharge Instructions (Signed)
Inpatient Rehab Discharge Instructions  Brad Taylor Discharge date and time: No discharge date for patient encounter.   Activities/Precautions/ Functional Status: Activity: activity as tolerated Diet: diabetic diet Wound Care: keep wound clean and dry Functional status:  ___ No restrictions     ___ Walk up steps independently _x__ 24/7 supervision/assistance   ___ Walk up steps with assistance ___ Intermittent supervision/assistance  ___ Bathe/dress independently ___ Walk with walker     ___ Bathe/dress with assistance ___ Walk Independently    ___ Shower independently _x__ Walk with assistance    ___ Shower with assistance ___ No alcohol     ___ Return to work/school ________  Special Instructions:    COMMUNITY REFERRALS UPON DISCHARGE:    Home Health:   PT, OT, SPT, RN   [place] [id number]:[phone] Date of last service:10/08/2013  Medical Equipment/Items [place]  Agency/Supplier:[employer]  [id number]   GENERAL COMMUNITY RESOURCES FOR PATIENT/FAMILY: Support Groups:[facility] SUPPORT GROUP   Wound care left lower extremity with silver dressing topped with Vaseline gauze secured with Kerlix and tape as needed STROKE/TIA DISCHARGE INSTRUCTIONS SMOKING Cigarette smoking nearly doubles your risk of having a stroke & is the single most alterable risk factor  If you smoke or have smoked in the last 12 months, you are advised to quit smoking for your health.  Most of the excess cardiovascular risk related to smoking disappears within a year of stopping.  Ask you doctor about anti-smoking medications  [place] Quit Line: 1-800-QUIT NOW  Free Smoking Cessation Classes [phone]  CHOLESTEROL Know your levels; limit fat & cholesterol in your diet  Lipid Panel     Component Value Date/Time   CHOL 216* 09/16/2013 0415   TRIG 288* 09/16/2013 0415   HDL 35* 09/16/2013 0415   CHOLHDL 6.2 09/16/2013 0415   VLDL 58* 09/16/2013 0415   [place]  123* 09/16/2013 0415      Many patients benefit from treatment even if their cholesterol is at goal.  Goal: Total Cholesterol (CHOL) less than 160  Goal:  Triglycerides (TRIG) less than 150  Goal:  HDL greater than 40  Goal:  LDL (LDLCALC) less than 100   BLOOD PRESSURE American Stroke Association blood pressure target is less that 120/80 mm/Hg  Your discharge blood pressure is:  BP: 128/78 mmHg  Monitor your blood pressure  Limit your salt and alcohol intake  Many individuals will require more than one medication for high blood pressure  DIABETES (A1c is a blood sugar average for last 3 months) Goal HGBA1c is under 7% (HBGA1c is blood sugar average for last 3 months)  Diabetes:     Lab Results  Component Value Date   HGBA1C 7.4* 09/16/2013     Your HGBA1c can be lowered with medications, healthy diet, and exercise.  Check your blood sugar as directed by your physician  Call your physician if you experience unexplained or low blood sugars.  PHYSICAL ACTIVITY/REHABILITATION Goal is 30 minutes at least 4 days per week  Activity: No driving, Therapies: Physical Therapy: Home Health Return to work:   Activity decreases your risk of heart attack and stroke and makes your heart stronger.  It helps control your weight and blood pressure; helps you relax and can improve your mood.  Participate in a regular exercise program.  Talk with your doctor about the best form of exercise for you (dancing, walking, swimming, cycling).  DIET/WEIGHT Goal is to maintain a healthy weight  Your discharge diet  is: Carb Control  liquids Your height is:    Your current weight is: Weight: 126.3 kg (278 lb 7.1 oz) Your Body Mass Index (BMI) is:     Following the type of diet specifically designed for you will help prevent another stroke.  Your goal weight range is:    Your goal Body Mass Index (BMI) is 19-24.  Healthy food habits can help reduce 3 risk factors for stroke:  High cholesterol,  hypertension, and excess weight.  RESOURCES Stroke/Support Group:  Call [phone]   STROKE EDUCATION PROVIDED/REVIEWED AND GIVEN TO PATIENT Stroke warning signs and symptoms How to activate emergency medical system (call 911). Medications prescribed at discharge. Need for follow-up after discharge. Personal risk factors for stroke. Pneumonia vaccine given:  Flu vaccine given:  My questions have been answered, the writing is legible, and I understand these instructions.  I will adhere to these goals & educational materials that have been provided to me after my discharge from the hospital.      My questions have been answered and I understand these instructions. I will adhere to these goals and the provided educational materials after my discharge from the hospital.  Patient/Caregiver Signature _______________________________ Date __________  Clinician Signature _______________________________________ Date __________  Please bring this form and your medication list with you to all your follow-up doctor's appointments.

## 2013-10-07 NOTE — Progress Notes (Signed)
Speech Language Pathology Daily Session Note  Patient Details  Name: Brad Taylor MRN: [id number] Date of Birth: 1951/01/18  Today's Date: 10/07/2013 Time: 2263-3354 Time Calculation (min): 30 min  Short Term Goals: Week 2: SLP Short Term Goal 1 (Week 2): Patient will utilize speech intelligibility strategies in conversation with Min vebral cues to self-monitor and correct errors. SLP Short Term Goal 2 (Week 2): Patient will utilize word finding strategies in conversation with Min verbal cues. SLP Short Term Goal 3 (Week 2): Patient will demonstrate problem solving with daily complex problems with Min verbal cues to self-monitor and correct errors.  Skilled Therapeutic Interventions: Skilled therapeutic intervention addressed speech goals. Patient required Min question cues to use speech intelligibility strategies in conversation. Patient was consuming breakfast of regular textures and thin liquids during session. He required Min verbal cues for slow bites and  small sips and to ask for assist with cutting food. Continue with current plan of care.    FIM:  Comprehension Comprehension Mode: Auditory Comprehension: 5-Follows basic conversation/direction: With no assist Expression Expression Mode: Verbal Expression: 3-Expresses basic 50 - 74% of the time/requires cueing 25 - 50% of the time. Needs to repeat parts of sentences. Social Interaction Social Interaction: 6-Interacts appropriately with others with medication or extra time (anti-anxiety, antidepressant). Problem Solving Problem Solving: 4-Solves basic 75 - 89% of the time/requires cueing 10 - 24% of the time Memory Memory: 5-Recognizes or recalls 90% of the time/requires cueing < 10% of the time FIM - Eating Eating Activity: 5: Set-up assist for open containers;5: Needs verbal cues/supervision;5: Set-up assist for cut food  Pain Pain Assessment Pain Assessment: No/denies pain  Therapy/Group: Individual  Therapy  Gale Klar 10/07/2013, 12:09 PM

## 2013-10-07 NOTE — Progress Notes (Signed)
Subjective/Complaints: 63 y.o. male with history of COPD, diabetes mellitus and peripheral neuropathy, PTSD. Admitted 09/15/2013 with right-sided weakness and slurred speech. MRI of the brain showed acute ischemic infarct involving the posterior limb of the left internal capsule as well as additional subcentimeter ischemic infarct deep white matter of the left centrum semiovale and remote lacunar infarct bilateral basal ganglia.   No problems overnite, discussed D/C after therapy yest  Review of Systems - Weak on R side,   Objective: Vital Signs: Blood pressure 128/78, pulse 54, temperature 98.8 F (37.1 C), temperature source Oral, resp. rate 17, weight 126.3 kg (278 lb 7.1 oz), SpO2 92.00%. No results found. Results for orders placed during the hospital encounter of 09/20/13 (from the past 72 hour(s))  GLUCOSE, CAPILLARY     Status: Abnormal   Collection Time    10/04/13 12:04 PM      Result Value Ref Range   Glucose-Capillary 196 (*) 70 - 99 mg/dL   Comment 1 Notify RN    GLUCOSE, CAPILLARY     Status: Abnormal   Collection Time    10/04/13  4:20 PM      Result Value Ref Range   Glucose-Capillary 237 (*) 70 - 99 mg/dL   Comment 1 Notify RN    GLUCOSE, CAPILLARY     Status: Abnormal   Collection Time    10/04/13  9:14 PM      Result Value Ref Range   Glucose-Capillary 177 (*) 70 - 99 mg/dL  GLUCOSE, CAPILLARY     Status: Abnormal   Collection Time    10/05/13  7:19 AM      Result Value Ref Range   Glucose-Capillary 121 (*) 70 - 99 mg/dL  GLUCOSE, CAPILLARY     Status: Abnormal   Collection Time    10/05/13 12:07 PM      Result Value Ref Range   Glucose-Capillary 192 (*) 70 - 99 mg/dL  GLUCOSE, CAPILLARY     Status: Abnormal   Collection Time    10/05/13  4:41 PM      Result Value Ref Range   Glucose-Capillary 196 (*) 70 - 99 mg/dL  GLUCOSE, CAPILLARY     Status: Abnormal   Collection Time    10/05/13  9:11 PM      Result Value Ref Range   Glucose-Capillary 177 (*)  70 - 99 mg/dL  GLUCOSE, CAPILLARY     Status: Abnormal   Collection Time    10/06/13  7:31 AM      Result Value Ref Range   Glucose-Capillary 140 (*) 70 - 99 mg/dL   Comment 1 Notify RN    GLUCOSE, CAPILLARY     Status: Abnormal   Collection Time    10/06/13 11:41 AM      Result Value Ref Range   Glucose-Capillary 168 (*) 70 - 99 mg/dL   Comment 1 Notify RN    GLUCOSE, CAPILLARY     Status: Abnormal   Collection Time    10/06/13  4:57 PM      Result Value Ref Range   Glucose-Capillary 216 (*) 70 - 99 mg/dL   Comment 1 Notify RN    GLUCOSE, CAPILLARY     Status: Abnormal   Collection Time    10/06/13  8:21 PM      Result Value Ref Range   Glucose-Capillary 191 (*) 70 - 99 mg/dL   Comment 1 Notify RN         General: No  acute distress Mood and affect are appropriate Heart: Regular rate and rhythm no rubs murmurs or extra sounds Lungs: Clear to auscultation, breathing unlabored, no rales or wheezes, poor inspiratory effort Abdomen: Positive bowel sounds, soft nontender to palpation, nondistended Extremities: No clubbing, cyanosis, or edema Skin: No evidence of breakdown, no evidence of rash Neurologic: pt is more arousable. Could tell me he was at [facility]. Voice clearer. Cranial nerves II through XII intact, motor strength is 5/5 in left deltoid, bicep, tricep, grip, hip flexor, knee extensors, ankle dorsiflexor and plantar flexor, 3- RUE,  Musculoskeletal: Full range of motion in all 4 extremities. No joint swelling  Assessment/Plan: 1. Functional deficits secondary to embolic left internal capsule/deep white matter infarct   which require 3+ hours per day of interdisciplinary therapy in a comprehensive inpatient rehab setting. Physiatrist is providing close team supervision and 24 hour management of active medical problems listed below. Physiatrist and rehab team continue to assess barriers to discharge/monitor patient progress toward functional and medical goals. Should be  ready for D/C in am FIM: FIM - Bathing Bathing Steps Patient Completed: Front perineal area;Right upper leg;Left upper leg;Right lower leg (including foot);Left lower leg (including foot) Bathing: 4: Min-Patient completes 8-9 55f 10 parts or 75+ percent  FIM - Upper Body Dressing/Undressing Upper body dressing/undressing steps patient completed: Thread/unthread left sleeve of pullover shirt/dress;Put head through opening of pull over shirt/dress;Pull shirt over trunk Upper body dressing/undressing: 0: Activity did not occur FIM - Lower Body Dressing/Undressing Lower body dressing/undressing steps patient completed: Don/Doff left shoe;Fasten/unfasten left shoe;Thread/unthread left pants leg;Don/Doff right sock;Don/Doff left sock Lower body dressing/undressing: 3: Mod-Patient completed 50-74% of tasks  FIM - Toileting Toileting steps completed by patient: Adjust clothing prior to toileting Toileting Assistive Devices: Grab bar or rail for support Toileting: 2: Max-Patient completed 1 of 3 steps  FIM - Radio producer Devices: Grab bars Toilet Transfers: 4-To toilet/BSC: Min A (steadying Pt. > 75%);4-From toilet/BSC: Min A (steadying Pt. > 75%)  FIM - Bed/Chair Transfer Bed/Chair Transfer Assistive Devices: Arm rests Bed/Chair Transfer: 5: Supine > Sit: Supervision (verbal cues/safety issues);5: Sit > Supine: Supervision (verbal cues/safety issues)  FIM - Locomotion: Wheelchair Distance: 100 Locomotion: Wheelchair: 0: Activity did not occur FIM - Locomotion: Ambulation Locomotion: Ambulation Assistive Devices: Administrator Ambulation/Gait Assistance: 5: Supervision Locomotion: Ambulation: 4: Travels 150 ft or more with minimal assistance (Pt.>75%)  Comprehension Comprehension Mode: Auditory Comprehension: 5-Follows basic conversation/direction: With no assist  Expression Expression Mode: Verbal Expression: 3-Expresses basic 50 - 74% of the  time/requires cueing 25 - 50% of the time. Needs to repeat parts of sentences.  Social Interaction Social Interaction: 6-Interacts appropriately with others with medication or extra time (anti-anxiety, antidepressant).  Problem Solving Problem Solving: 4-Solves basic 75 - 89% of the time/requires cueing 10 - 24% of the time  Memory Memory: 5-Recognizes or recalls 90% of the time/requires cueing < 10% of the time  Medical Problem List and Plan:  1. Left internal capsule/deep white matter infarcts felt to be SVD thrombotic    2. DVT Prophylaxis/Anticoagulation: Subcutaneous Lovenox. Monitor platelet counts and any signs of bleeding  3. Pain Management: Tylenol as needed  4. Mood/PTSD/depression. Wellbutrin 150 mg daily, BuSpar 5 mg twice a day as needed, Depakote 1000 mg each bedtime, effexor 150 mg once a day and trazodone 300 mg each bedtime -consult to neuropsych 5. Neuropsych: This patient is not yet capable of making decisions on his own behalf.  6. Diabetes mellitus with  peripheral neuropathy.uncontrolled titrating Lantus --increase am dose   Hemoglobin A1c 7.4. Currently on sliding scale insulin.   Check blood sugars a.c. and at bedtime resume oral agents as directed   -still receiving much less insulin than home dose goal is >110,<180 7. Hypertension. Lisinopril 10 mg day. Monitor with increased activity  8. Hypothyroidism. Synthroid .TSH 4.045  9. Hyperlipidemia. Lipitor  10. GERD. Protonix  11. Left lateral lower extremity ulcer. Followup wound care nurse with dressing changes as directed/Unna boot  .  LOS (Days) 17 A FACE TO FACE EVALUATION WAS PERFORMED  KIRSTEINS,ANDREW E 10/07/2013, 7:34 AM

## 2013-10-07 NOTE — Progress Notes (Signed)
Occupational Therapy Session Note  Patient Details  Name: Brad Taylor MRN: [id number] Date of Birth: 22-Aug-1950  Today's Date: 10/07/2013 Time: 1005-1105 Time Calculation (min): 60 min  Short Term Goals: Week 1:  OT Short Term Goal 1 (Week 1): Pt will perform all bathing with min assist using AE for sit to stand.   OT Short Term Goal 1 - Progress (Week 1): Not met OT Short Term Goal 2 (Week 1): Pt will perform all bathing with min assist using AE for sit to stand.   OT Short Term Goal 2 - Progress (Week 1): Not met OT Short Term Goal 3 (Week 1): Pt will donn pants and shoes with AE and min assist. OT Short Term Goal 3 - Progress (Week 1): Not met OT Short Term Goal 4 (Week 1): Pt will perform toilet transfers with supervision. OT Short Term Goal 4 - Progress (Week 1): Not met OT Short Term Goal 5 (Week 1): Pt/family will return demonstrate AAROM/PROM exercises for the RUE. OT Short Term Goal 5 - Progress (Week 1): Met Week 2:  OT Short Term Goal 1 (Week 2): Pt will perform all bathing with min assist using AE for sit to stand.   OT Short Term Goal 2 (Week 2): Pt will perform all bathing with min assist using AE for sit to stand.   OT Short Term Goal 3 (Week 2): Pt will donn pants and shoes with AE and min assist. OT Short Term Goal 4 (Week 2): Pt will perform toilet transfers with supervision. OT Short Term Goal 5 (Week 2): Pt will use the RUE with holding washcloth and objects to open during B/D tasks with min assist.       Skilled Therapeutic Interventions/Progress Updates:    Pt seen this session for a focus on family education with pt and his wife during ADL retraining. Pt used long sponge to reach his under his L arm, but continues to need assist with bathing completely. With extra time, he was able to pull his pants over his hips.  His wife actively participated in session and did well providing her husband with the necessary support. Pt continues to bathe at sink as he has a L  ankle wound.  Simulated shower stall transfers.  Pt ambulated into bathroom and practiced stepping over shower ledge that was set up using grab bars with steady A.  He was able to clear his feet over ledge without difficulty. Reviewed home exercises pt's wife can do with patient.  Pt resting in w/c at end of the session.  Therapy Documentation Precautions:  Precautions Precautions: Fall Precaution Comments: R sided weakness (RUE>RLE), dysarthria Restrictions Weight Bearing Restrictions: No    Pain: Pain Assessment Pain Assessment: No/denies pain ADL:  See FIM for current functional status  Therapy/Group: Individual Therapy  Keller 10/07/2013, 12:10 PM

## 2013-10-07 NOTE — Progress Notes (Signed)
Social Work Patient ID: Brad Taylor, male   DOB: 1951/07/26, 63 y.o.   MRN: [id number] Met with pt and wife to discuss readiness for discharge tomorrow.  Both feel ready DME delivered to room today. Prefer HH therapies due to the driving and  Getting in and out of car.  If feel can transition to OP will contact this worker to transition to OP.

## 2013-10-07 NOTE — Progress Notes (Signed)
Physical Therapy Session Note  Patient Details  Name: Brad Taylor MRN: [id number] Date of Birth: Nov 29, 1950  Today's Date: 10/07/2013 Time: 0830-0926 Time Calculation (min): 56 min  Short Term Goals: Week 3:   See LTG's  Skilled Therapeutic Interventions/Progress Updates:   Pt received sitting in w/c in room, agreeable to therapy this morning.  Assisted with donning pants and shoes with R AFO prior to gait training.  Focus of session was extended gait training in controlled and uncontrolled environment in gift shop, subway, and men's restroom >300' with two seated rest breaks in order to further challenge balance in carpeted environment and also in moderately distracting environment while maintaining balance.  Pt able to ambulate through all surfaces at supervision level with only one instance of minimal LOB, however pt able to self correct without assist.  He did require min assist when standing from low chair in breezeway due to no armrest to assist.  Ended session with stair negotiation up/down 2, 6" steps with use of R handrail sideways in step to pattern to simulate home entry.  Pt ambulated back to room at supervision level and left in w/c in room with wife present.  Discussed having her involved in afternoon session for family ed.  Wife verbalized agreement.  All needs in reach.   Therapy Documentation Precautions:  Precautions Precautions: Fall Precaution Comments: R sided weakness (RUE>RLE), dysarthria Restrictions Weight Bearing Restrictions: No   Pain: No pain reported this morning.    Locomotion : Ambulation Ambulation/Gait Assistance: 5: Supervision   See FIM for current functional status  Therapy/Group: Individual Therapy  Denice Bors 10/07/2013, 9:29 AM

## 2013-10-07 NOTE — Progress Notes (Signed)
The skilled treatment note has been reviewed and SLP is in agreement.  Yaretsi Humphres, M.A., CCC-SLP  [phone]   

## 2013-10-07 NOTE — Discharge Summary (Signed)
Discharge summary job [id number]

## 2013-10-08 ENCOUNTER — Encounter (HOSPITAL_COMMUNITY): Payer: Medicare HMO | Admitting: Occupational Therapy

## 2013-10-08 ENCOUNTER — Encounter (HOSPITAL_COMMUNITY): Payer: Medicare HMO | Admitting: Speech Pathology

## 2013-10-08 ENCOUNTER — Inpatient Hospital Stay (HOSPITAL_COMMUNITY): Payer: Medicare HMO | Admitting: Rehabilitation

## 2013-10-08 DIAGNOSIS — J449 Chronic obstructive pulmonary disease, unspecified: Secondary | ICD-10-CM

## 2013-10-08 DIAGNOSIS — G8110 Spastic hemiplegia affecting unspecified side: Secondary | ICD-10-CM

## 2013-10-08 DIAGNOSIS — E1142 Type 2 diabetes mellitus with diabetic polyneuropathy: Secondary | ICD-10-CM

## 2013-10-08 DIAGNOSIS — E1149 Type 2 diabetes mellitus with other diabetic neurological complication: Secondary | ICD-10-CM

## 2013-10-08 DIAGNOSIS — I6340 Cerebral infarction due to embolism of unspecified cerebral artery: Secondary | ICD-10-CM

## 2013-10-08 LAB — GLUCOSE, CAPILLARY: Glucose-Capillary: 135 mg/dL — ABNORMAL HIGH (ref 70–99)

## 2013-10-08 MED ORDER — BUSPIRONE HCL 10 MG PO TABS: 5.0000 mg | ORAL_TABLET | Freq: Two times a day (BID) | ORAL | Status: AC | PRN

## 2013-10-08 MED ORDER — INSULIN GLARGINE 100 UNIT/ML ~~LOC~~ SOLN: SUBCUTANEOUS | Status: DC

## 2013-10-08 MED ORDER — ATORVASTATIN CALCIUM 80 MG PO TABS: 80.0000 mg | ORAL_TABLET | Freq: Every day | ORAL | Status: DC

## 2013-10-08 MED ORDER — DIVALPROEX SODIUM 500 MG PO DR TAB: 1000.0000 mg | DELAYED_RELEASE_TABLET | Freq: Every day | ORAL | Status: DC

## 2013-10-08 MED ORDER — RABEPRAZOLE SODIUM 20 MG PO TBEC: 40.0000 mg | DELAYED_RELEASE_TABLET | Freq: Every day | ORAL | Status: AC

## 2013-10-08 MED ORDER — CLOPIDOGREL BISULFATE 75 MG PO TABS: 75.0000 mg | ORAL_TABLET | Freq: Every day | ORAL | Status: DC

## 2013-10-08 MED ORDER — LEVOTHYROXINE SODIUM 125 MCG PO TABS: 125.0000 ug | ORAL_TABLET | Freq: Every day | ORAL | Status: AC

## 2013-10-08 MED ORDER — BUPROPION HCL ER (SR) 150 MG PO TB12: 150.0000 mg | ORAL_TABLET | Freq: Every day | ORAL | Status: DC

## 2013-10-08 MED ORDER — LISINOPRIL 10 MG PO TABS: 10.0000 mg | ORAL_TABLET | Freq: Every day | ORAL | Status: DC

## 2013-10-08 MED ORDER — VENLAFAXINE HCL ER 150 MG PO CP24: 150.0000 mg | ORAL_CAPSULE | Freq: Every day | ORAL | Status: AC

## 2013-10-08 MED ORDER — TRAZODONE HCL 100 MG PO TABS: 300.0000 mg | ORAL_TABLET | Freq: Every day | ORAL | Status: AC

## 2013-10-08 NOTE — Progress Notes (Signed)
Speech Language Pathology Session Note & Discharge Summary  Patient Details  Name: Brad Taylor MRN: [id number] Date of Birth: [birth year]  Today's Date: 10/08/2013 Time: 1000-1015 Time Calculation (min): 15 min  Skilled Therapeutic Interventions:  Skilled treatment session focused on patient/family education in regards to pt's current cognitive-linguistic function, speech intelligibility and swallowing function. Patient's wife educated on strategies to utilize at home to increase recall, safety, speech intelligibility and swallowing safety with regular textures and thin liquids. Handouts also given to reinforce information. Patient's wife has been present throughout sessions and verbalized understanding of all information without any questions. Patient will discharge home today with 24 hour supervision.   Patient has met 3 of 3 long term goals.  Patient to discharge at overall Supervision level.   Reasons goals not met: N/A   Clinical Impression/Discharge Summary: Pt has made functional gains and has met 3 of 3 LTG's this admission due to increased speech intelligibility, word-finding and functional problem solving. Currently, pt is consuming regular textures with thin liquids with supervision cueing required for utilization of swallowing compensatory strategies.  Pt also requires supervision cueing for overall speech intelligibility with utilization of a slow rate and increased vocal intensity.  Pt also requires supervision for complex problem solving in regards to medication and money management. Pt/family education complete and pt will discharge home today with 24 hour supervision. Recommend f/u SLP services to maximize cognitive and swallowing function and overall speech intelligibility in order to maximize his overall functional independence and reduce caregiver burden.   Care Partner:  Caregiver Able to Provide Assistance: Yes  Type of Caregiver Assistance:  Physical;Cognitive  Recommendation:  24 hour supervision/assistance;Outpatient SLP  Rationale for SLP Follow Up: Reduce caregiver burden;Maximize functional communication;Maximize cognitive function and independence;Maximize swallowing safety   Equipment: N/A   Reasons for discharge: Treatment goals met;Discharged from hospital   Patient/Family Agrees with Progress Made and Goals Achieved: Yes   See FIM for current functional status  Erich Kochan 10/08/2013, 10:17 AM

## 2013-10-08 NOTE — Plan of Care (Signed)
Problem: RH Dressing Goal: LTG Patient will perform lower body dressing w/assist (OT) LTG: Patient will perform lower body dressing with assist, with/without cues in positioning using equipment (OT)  Outcome: Not Met (add Reason) Needs mod assist.  Problem: RH Toileting Goal: LTG Patient will perform toileting w/assist, cues/equip (OT) LTG: Patient will perform toiletiing (clothes management/hygiene) with assist, with/without cues using equipment (OT)  Outcome: Not Met (add Reason) Pt needed mod assist for toileting task.  Problem: RH Vision Goal: RH LTG Vision (Specify) Outcome: Not Met (add Reason) Did not perform as pt did not exhibit deficits during functional tasks.     

## 2013-10-08 NOTE — Progress Notes (Signed)
Occupational Therapy Discharge Summary  Patient Details  Name: Brad Taylor MRN: [id number] Date of Birth: August 16, 1950  Today's Date: 10/08/2013 Time: 0900-0956 Time Calculation (min): 56 min   Session Note: Pt performed bathing and dressing sit to stand at the sink with wife present.  He needed min assist for washing peri area secondary to having decreased internal rotation of the LUE when attempting to reach behind his back.  He also needed min assist with threading his right pants leg with max assist for donning the right shoe and AFO.  All other aspects of bathing and dressing he was able to perform with supervision.  Note however that his wife assisted him with bathing prior to session so this was not observed.  Pt also performed toilet transfer with supervision but was not able to successfully have a BM.    Patient has met 10 of 12 long term goals due to improved balance, ability to compensate for deficits, functional use of  RIGHT upper extremity, improved awareness and improved coordination.  Patient to discharge at [facility] Assist level.  Patient's care partner is independent to provide the necessary physical and cognitive assistance at discharge.    Reasons goals not met: Pt continues to need mod assist for LB dressing tasks even though AE has been introduced as well as needing mod assist for toileting tasks secondary to limitations in his non-impaired RUE that impact his ability to perform his own hygiene.  AE has also been discussed to assist with toilet hygiene.    Recommendation:  Patient will benefit from ongoing skilled OT services in home health setting to continue to advance functional skills in the area of BADL.  Pt still demonstrates significant RUE functional movement and coordination deficts as well as sequencing, standing balance deficits, and visual tracking deficits.  Feel he needs initial 24 hour supervision for safety as he is still a high fall risk.  Recommend  continued OT to help reach modified independent level and increase functional use of his dominant RUE.    Equipment: No equipment provided  Reasons for discharge: treatment goals met and discharge from hospital  Patient/family agrees with progress made and goals achieved: Yes  OT Discharge Precautions/Restrictions  Precautions Precautions: Fall Precaution Comments: R sided weakness (RUE>RLE), dysarthria Restrictions Weight Bearing Restrictions: No  Pain Pain Assessment Pain Assessment: 0-10 Pain Score: 4  Pain Type: Chronic pain Pain Location: Back Pain Orientation: Lower;Left Pain Descriptors / Indicators: Aching Pain Intervention(s): RN made aware ADL  See FIM scale  Vision/Perception  Vision - History Baseline Vision: No visual deficits Visual History: Cataracts Patient Visual Report: No change from baseline Vision - Assessment Eye Alignment: Within Functional Limits Vision Assessment: Vision tested Ocular Range of Motion: Within Functional Limits Tracking/Visual Pursuits: Decreased smoothness of horizontal tracking;Decreased smoothness of vertical tracking Saccades: Decreased speed of saccadic movement Visual Fields: No apparent deficits Additional Comments: Pt still with lost fixation in both tracking to the left and right as well as decreased smoothness of tracking.  Pt was able to read a paragraph without difficulty and no reports of the letters or words jumping around.   Perception Perception: Within Functional Limits Praxis Praxis: Impaired Praxis Impairment Details: Motor planning Praxis-Other Comments: Pt with slight motor planning difficulty noted when attempting to manipulate the LH sponge with bathing tasks.    Cognition Overall Cognitive Status: Impaired/Different from baseline Arousal/Alertness: Awake/alert Orientation Level: Oriented X4 Attention: Focused;Sustained;Selective Focused Attention: Appears intact Sustained Attention: Appears  intact Selective Attention: Impaired  Selective Attention Impairment: Functional basic Alternating Attention: Impaired Alternating Attention Impairment: Functional complex Memory: Impaired Memory Impairment: Decreased recall of new information;Decreased short term memory Decreased Short Term Memory: Functional basic Awareness: Impaired Awareness Impairment: Anticipatory impairment Problem Solving: Impaired Problem Solving Impairment: Functional basic Safety/Judgment: Impaired Comments: Pt still needs increased time for processing of basic tasks such as sequencing of bathing and dressing.   Sensation Sensation Light Touch: Appears Intact Stereognosis: Not tested Hot/Cold: Appears Intact Proprioception: Appears Intact Coordination Gross Motor Movements are Fluid and Coordinated: No Fine Motor Movements are Fluid and Coordinated: No Coordination and Movement Description: Pt currently with Brunnstrum stage III movement in the right arm and leg.  Pt demonstrates 90% gross grasp with 10-20% active release.   Heel Shin Test: Pt continues to have some decreased fluidity of movement, however feel its due to weakness in R hip.  Motor  Motor Motor: Hemiplegia Motor - Discharge Observations: Weakness in RUE>RLE, continues to have posterior pelvic tilt in sitting and slight posterior lean at times in standing with occasional LOB backwards with initital sit to stand if he doesn't position his RLE up under him.   Mobility  Bed Mobility Bed Mobility: Supine to Sit;Sit to Supine Supine to Sit: 5: Supervision Supine to Sit Details: Verbal cues for sequencing Sit to Supine: 5: Supervision Sit to Supine - Details: Verbal cues for precautions/safety Transfers Transfers: Sit to Stand Sit to Stand: 5: Supervision Sit to Stand Details: Verbal cues for sequencing;Verbal cues for technique Stand to Sit: 5: Supervision Stand to Sit Details (indicate cue type and reason): Verbal cues for  precautions/safety  Trunk/Postural Assessment  Cervical Assessment Cervical Assessment: Within Functional Limits Thoracic Assessment Thoracic Assessment: Within Functional Limits Lumbar Assessment Lumbar Assessment: Within Functional Limits Postural Control Postural Limitations: Pt demonstrates rounded shoulders and posterior pelvic tilt in sitting  Balance Dynamic Sitting Balance Dynamic Sitting - Balance Support: No upper extremity supported Dynamic Sitting - Level of Assistance: 6: Modified independent (Device/Increase time) Static Standing Balance Static Standing - Balance Support: Left upper extremity supported Static Standing - Level of Assistance: 5: Stand by assistance Dynamic Standing Balance Dynamic Standing - Balance Support: Bilateral upper extremity supported Dynamic Standing - Level of Assistance: 5: Stand by assistance Extremity/Trunk Assessment RUE Assessment RUE Assessment: Exceptions to [facility] RUE Strength RUE Overall Strength Comments: Pt with Brunnstrum stage III movement in the right arm and hand at this time.  He demonstrates gross grasp and minimal release.  Still with synergy pattern noted when attempting shoulder flexion and elbow flexion.   LUE Assessment LUE Assessment: Within Functional Limits  See FIM for current functional status  Joell Buerger OTR/L 10/08/2013, 4:04 PM

## 2013-10-08 NOTE — Progress Notes (Signed)
Patient and spouse received written and verbal discharge instructions from [place], [place]. Aware of follow up appointments, medications and dose changes for Lantus insulin,  Wife verbalizes understanding and denies any questions in regards to insulin administration and blood glucose monitoring, continue wound care to Left leg as PTA. Patient and wife deny any questions or concerns. Personal Belongings packed by spouse, patient to be discharge today after therapies completed. [place], Jenai Scaletta Selinda Eon

## 2013-10-08 NOTE — Discharge Summary (Signed)
NAME:  Brad Taylor, Brad Taylor NO.:  [id number]  MEDICAL RECORD NO.:  [id number]  LOCATION:  4M11C                        FACILITY:  [facility]  PHYSICIAN:  Lauraine Rinne, P.A.  DATE OF BIRTH:  09-Mar-1951  DATE OF ADMISSION:  09/20/2013 DATE OF DISCHARGE:  10/08/2013                              DISCHARGE SUMMARY   DISCHARGE DIAGNOSES: 1. Left internal capsule deep white matter infarction. 2. Subcutaneous Lovenox for deep vein thrombosis prophylaxis, pain     management, posttraumatic stress disorder with depression, diabetes     mellitus, peripheral neuropathy, tobacco abuse, hypertension,     hypothyroidism, hyperlipidemia, gastroesophageal reflux disease,     and left lateral lower extremity ulcer.  HISTORY OF PRESENT ILLNESS:  This is a 63 year old male multimedical admitted September 15, 2013, with right-sided weakness, slurred speech. MRI of the brain showed acute ischemic infarct to posterior limb of the left internal capsule as well as additional subcentimeter ischemic deep white matter infarcts.  MRA of the head with no stenosis or occlusion. Echocardiogram was pending.  Carotid Dopplers with no ICA stenosis.  The patient did not receive tPA.  Neurology Service was consulted, maintained on Plavix therapy.  Subcutaneous Lovenox for DVT prophylaxis. Follow up wound care nurse for chronic left lower extremity ulcer.  The patient was admitted for comprehensive rehab program.  PAST MEDICAL HISTORY:  See discharge diagnoses.  SOCIAL HISTORY:  Lives with spouse.  FUNCTIONAL HISTORY:  Prior to admission used a cane for long distances. Functional status upon admission to rehab services was ambulating 200 feet with decreased gait velocity, staggering gait using assistive device, moderate assist upper body dressing, minimal assist upper body bathing, moderate assist lower body bathing.  PHYSICAL EXAMINATION:  VITAL SIGNS:  Blood pressure 153/79, pulse 68, temperature  97.3, respirations 18. GENERAL:  This as an alert male, speech was dysarthric, but intelligible.  He was oriented x3.  Right facial droop. LUNGS:  Clear to auscultation. CARDIAC:  Regular rate and rhythm. ABDOMEN:  Soft, nontender.  Good bowel sounds.  REHABILITATION HOSPITAL COURSE:  The patient was admitted to inpatient rehab services with therapies initiated on a 3-hour daily basis consisting of physical therapy, occupational therapy, speech therapy, and rehabilitation nursing.  The following issues were addressed during the patient's rehabilitation stay.  Pertaining to [name] left internal capsule, CVA remained stable, maintained on Plavix therapy.  He did have a history of depression, posttraumatic stress disorder, maintained on Wellbutrin, Effexor, as well as Depakote.  The patient was participative with his therapies.  History of diabetes mellitus, peripheral neuropathy.  Hemoglobin A1c 7.4.  His Lantus insulin had been resumed and titrated accordingly.  He would follow up with his primary MD.  Blood pressures monitored with lisinopril and no orthostatic changes.  He remained on hormone supplement for hypothyroidism.  He did have a chronic left lower extremity ulcer that showed no change.  He continued with silver dressings with Vaseline gauze secured with Kerlix tape as needed.  The patient received weekly collaborative interdisciplinary team conferences to discuss estimated length of stay, family teaching, and any barriers to discharge.  He was ambulating 150 feet rolling walker minimal guard, some tactile cues for  eccentric control in standing to sit, activities of daily living, working on toilet transfers from wheelchair with rolling walker x2, again education with his wife requiring close supervision.  He did have some dysarthric speech, although he was intelligible and able to communicate his needs. Full teaching was completed.  He was discharged to home.  DISCHARGE  MEDICATIONS:  Lipitor 80 mg p.o. daily, Wellbutrin 150 mg p.o. daily, BuSpar 5 mg p.o. b.i.d. as needed anxiety, Plavix 75 mg p.o. daily, Depakote 1000 mg p.o. at bedtime, Lantus insulin 30 units b.i.d., Synthroid 125 mcg p.o. daily, Lisinopril 10 mg p.o. daily, AcipHex 40 mg p.o. daily,  Ocean nasal spray as needed, Effexor 150 mg p.o. daily.  DIET:  Diabetic diet.  SPECIAL INSTRUCTIONS:  The patient would follow up with Dr. Lynford Citizen at the outpatient rehab center November 09, 2013, Dr. Antony Contras, call for appointment, Dr. Jenna Luo, medical management appointment to be made.  Special instructions included wound care left lower extremity with silver dressing topped with Vaseline gauze, secured with Kerlix and tape is needed.  The patient is also advised no driving.     Lauraine Rinne, P.A.     DA/MEDQ  D:  10/07/2013  T:  10/08/2013  Job:  [id number]  cc:   Charlett Blake, M.D. Pramod P. Leonie Man, MD Marrianne Mood, MD

## 2013-10-08 NOTE — Progress Notes (Signed)
Subjective/Complaints: 63 y.o. male with history of COPD, diabetes mellitus and peripheral neuropathy, PTSD. Admitted 09/15/2013 with right-sided weakness and slurred speech. MRI of the brain showed acute ischemic infarct involving the posterior limb of the left internal capsule as well as additional subcentimeter ischemic infarct deep white matter of the left centrum semiovale and remote lacunar infarct bilateral basal ganglia.   No problems overnite, feels ready for d/c  Review of Systems - Weak on R side,   Objective: Vital Signs: Blood pressure 132/73, pulse 60, temperature 98 F (36.7 C), temperature source Oral, resp. rate 18, weight 126.3 kg (278 lb 7.1 oz), SpO2 95.00%. No results found. Results for orders placed during the hospital encounter of 09/20/13 (from the past 72 hour(s))  GLUCOSE, CAPILLARY     Status: Abnormal   Collection Time    10/05/13 12:07 PM      Result Value Ref Range   Glucose-Capillary 192 (*) 70 - 99 mg/dL  GLUCOSE, CAPILLARY     Status: Abnormal   Collection Time    10/05/13  4:41 PM      Result Value Ref Range   Glucose-Capillary 196 (*) 70 - 99 mg/dL  GLUCOSE, CAPILLARY     Status: Abnormal   Collection Time    10/05/13  9:11 PM      Result Value Ref Range   Glucose-Capillary 177 (*) 70 - 99 mg/dL  GLUCOSE, CAPILLARY     Status: Abnormal   Collection Time    10/06/13  7:31 AM      Result Value Ref Range   Glucose-Capillary 140 (*) 70 - 99 mg/dL   Comment 1 Notify RN    GLUCOSE, CAPILLARY     Status: Abnormal   Collection Time    10/06/13 11:41 AM      Result Value Ref Range   Glucose-Capillary 168 (*) 70 - 99 mg/dL   Comment 1 Notify RN    GLUCOSE, CAPILLARY     Status: Abnormal   Collection Time    10/06/13  4:57 PM      Result Value Ref Range   Glucose-Capillary 216 (*) 70 - 99 mg/dL   Comment 1 Notify RN    GLUCOSE, CAPILLARY     Status: Abnormal   Collection Time    10/06/13  8:21 PM      Result Value Ref Range   Glucose-Capillary  191 (*) 70 - 99 mg/dL   Comment 1 Notify RN    GLUCOSE, CAPILLARY     Status: Abnormal   Collection Time    10/07/13  7:43 AM      Result Value Ref Range   Glucose-Capillary 154 (*) 70 - 99 mg/dL  GLUCOSE, CAPILLARY     Status: Abnormal   Collection Time    10/07/13 11:44 AM      Result Value Ref Range   Glucose-Capillary 207 (*) 70 - 99 mg/dL  GLUCOSE, CAPILLARY     Status: Abnormal   Collection Time    10/07/13  4:53 PM      Result Value Ref Range   Glucose-Capillary 221 (*) 70 - 99 mg/dL  GLUCOSE, CAPILLARY     Status: Abnormal   Collection Time    10/07/13  9:01 PM      Result Value Ref Range   Glucose-Capillary 172 (*) 70 - 99 mg/dL  GLUCOSE, CAPILLARY     Status: Abnormal   Collection Time    10/08/13  7:15 AM  Result Value Ref Range   Glucose-Capillary 135 (*) 70 - 99 mg/dL   Comment 1 Notify RN         General: No acute distress Mood and affect are appropriate Heart: Regular rate and rhythm no rubs murmurs or extra sounds Lungs: Clear to auscultation, breathing unlabored, no rales or wheezes, poor inspiratory effort Abdomen: Positive bowel sounds, soft nontender to palpation, nondistended Extremities: No clubbing, cyanosis, or edema Skin: No evidence of breakdown, no evidence of rash Neurologic: pt is more arousable. Could tell me he was at [facility]. Voice clearer. Cranial nerves II through XII intact, motor strength is 5/5 in left deltoid, bicep, tricep, grip, hip flexor, knee extensors, ankle dorsiflexor and plantar flexor, 3- RUE,  Musculoskeletal: Full range of motion in all 4 extremities. No joint swelling  Assessment/Plan: 1. Functional deficits secondary to embolic left internal capsule/deep white matter infarct    Stable for D/C today after therapy for family teaching F/u PCP in 1-2 weeks F/u PM&R 3 weeks See D/C summary See D/C instructions FIM: FIM - Bathing Bathing Steps Patient Completed: Front perineal area;Right upper leg;Left upper  leg;Right lower leg (including foot);Left lower leg (including foot);Chest;Right Arm;Abdomen Bathing: 4: Min-Patient completes 8-9 56f 10 parts or 75+ percent  FIM - Upper Body Dressing/Undressing Upper body dressing/undressing steps patient completed: Thread/unthread left sleeve of pullover shirt/dress;Put head through opening of pull over shirt/dress;Pull shirt over trunk;Thread/unthread right sleeve of pullover shirt/dresss Upper body dressing/undressing: 5: Supervision: Safety issues/verbal cues FIM - Lower Body Dressing/Undressing Lower body dressing/undressing steps patient completed: Thread/unthread right pants leg;Thread/unthread left pants leg;Don/Doff left shoe;Pull pants up/down (uses briefs, TED hose, and reacher. 4/7 steps) Lower body dressing/undressing: 3: Mod-Patient completed 50-74% of tasks  FIM - Toileting Toileting steps completed by patient: Adjust clothing prior to toileting Toileting Assistive Devices: Grab bar or rail for support Toileting: 0: Activity did not occur  FIM - Radio producer Devices: Insurance account manager Transfers: 5-To toilet/BSC: Supervision (verbal cues/safety issues);5-From toilet/BSC: Supervision (verbal cues/safety issues)  FIM - Engineer, site Assistive Devices: Arm rests Bed/Chair Transfer: 5: Supine > Sit: Supervision (verbal cues/safety issues);5: Sit > Supine: Supervision (verbal cues/safety issues)  FIM - Locomotion: Wheelchair Distance: 100 Locomotion: Wheelchair: 0: Activity did not occur FIM - Locomotion: Ambulation Locomotion: Ambulation Assistive Devices: Administrator Ambulation/Gait Assistance: 5: Supervision Locomotion: Ambulation: 5: Travels 150 ft or more with supervision/safety issues  Comprehension Comprehension Mode: Auditory Comprehension: 5-Understands basic 90% of the time/requires cueing < 10% of the time  Expression Expression Mode: Verbal Expression: 3-Expresses  basic 50 - 74% of the time/requires cueing 25 - 50% of the time. Needs to repeat parts of sentences.  Social Interaction Social Interaction: 6-Interacts appropriately with others with medication or extra time (anti-anxiety, antidepressant).  Problem Solving Problem Solving: 4-Solves basic 75 - 89% of the time/requires cueing 10 - 24% of the time  Memory Memory: 5-Recognizes or recalls 90% of the time/requires cueing < 10% of the time  Medical Problem List and Plan:  1. Left internal capsule/deep white matter infarcts felt to be SVD thrombotic    2. DVT Prophylaxis/Anticoagulation: Subcutaneous Lovenox. Monitor platelet counts and any signs of bleeding  3. Pain Management: Tylenol as needed  4. Mood/PTSD/depression. Wellbutrin 150 mg daily, BuSpar 5 mg twice a day as needed, Depakote 1000 mg each bedtime, effexor 150 mg once a day and trazodone 300 mg each bedtime -consult to neuropsych 5. Neuropsych: This patient is not yet capable of making  decisions on his own behalf.  6. Diabetes mellitus with peripheral neuropathy.uncontrolled titrating Lantus --increase am dose   Hemoglobin A1c 7.4. Currently on sliding scale insulin.   Check blood sugars a.c. and at bedtime resume oral agents as directed   -still receiving much less insulin than home dose goal is >110,<180 7. Hypertension. Lisinopril 10 mg day. Monitor with increased activity  8. Hypothyroidism. Synthroid .TSH 4.045  9. Hyperlipidemia. Lipitor  10. GERD. Protonix  11. Left lateral lower extremity ulcer. Followup wound care nurse with dressing changes as directed/Unna boot  .  LOS (Days) 18 A FACE TO FACE EVALUATION WAS PERFORMED  Brad Taylor E 10/08/2013, 7:50 AM

## 2013-10-08 NOTE — Progress Notes (Signed)
Patient taken down to private vehicle by NT via wheelchair. Home medications returned to patient's wife. [place], Brad Taylor

## 2013-10-08 NOTE — Plan of Care (Signed)
Problem: RH BLADDER ELIMINATION Goal: RH STG MANAGE BLADDER WITH ASSISTANCE STG Manage Bladder With Minimal Assistance  Outcome: Not Met (add Reason) Incontinent of bladder, unaware of when incontinent, total assist to change depends

## 2013-10-08 NOTE — Progress Notes (Signed)
Physical Therapy Discharge Summary  Patient Details  Name: Brad Taylor MRN: [id number] Date of Birth: 22-Nov-1950  Today's Date: 10/08/2013 Time: 0370-9643 Time Calculation (min): 45 min  Patient has met 10 of 10 long term goals due to improved activity tolerance, improved balance, improved postural control, increased strength, ability to compensate for deficits, functional use of  right upper extremity and right lower extremity, improved attention, improved awareness and improved coordination.  Patient to discharge at an ambulatory level Supervision.   Patient's care partner is independent to provide the necessary cognitive assistance at discharge.  Reasons goals not met: n/a  Recommendation:  Patient will benefit from ongoing skilled PT services in home health setting to continue to advance safe functional mobility, address ongoing impairments in balance, gait, RUE/LE strength, decreased attention, decreased awareness, decreased safety, and minimize fall risk.  Equipment: RW, R AFO and w/c w/ cushion  Reasons for discharge: treatment goals met and discharge from hospital  Patient/family agrees with progress made and goals achieved: Yes  PT Treatment/Intervention: Pt received sitting in w/c in room, had pt self propel w/c using BLEs >150' at Mod I level, performed car transfer at supervision level, ambulated >150' in controlled and home environment at supervision level with min cues for safety when turning.  Performed bed mobility at supervision to Mod I level, performed stairs at min/guard level sideways with single handrail sideways with min cues for foot placement.  Ended session with assessment of strength, sensation, coordination, etc. See below.  Also performed BERG balance test with score of 33/56, see details below.  Assisted pt back to room and was left in w/c, all needs in reaach.   PT Discharge Precautions/Restrictions Precautions Precautions: Fall Precaution Comments: R  sided weakness (RUE>RLE), dysarthria Restrictions Weight Bearing Restrictions: No   Pain Pain Assessment Pain Assessment: 0-10 Pain Score: 4  Pain Type: Chronic pain Pain Location: Back Pain Orientation: Lower;Left Pain Descriptors / Indicators: Aching Pain Intervention(s): RN made aware Vision/Perception     Cognition Overall Cognitive Status: Impaired/Different from baseline Arousal/Alertness: Awake/alert Orientation Level: Oriented X4 Attention: Sustained;Selective;Focused;Alternating Focused Attention: Appears intact Sustained Attention: Appears intact Selective Attention: Appears intact Alternating Attention: Impaired Alternating Attention Impairment: Functional complex Memory: Impaired Memory Impairment: Decreased recall of new information;Decreased short term memory Decreased Short Term Memory: Functional complex Awareness: Impaired Awareness Impairment: Anticipatory impairment Problem Solving: Impaired Problem Solving Impairment: Functional complex Self Monitoring: Impaired Self Monitoring Impairment: Functional complex Self Correcting: Impaired Self Correcting Impairment: Functional complex Safety/Judgment: Impaired Comments: Pt continues to perform some tasks at increased speed causing him to  be unsafe at times.  Sensation Sensation Light Touch: Appears Intact Stereognosis: Not tested Hot/Cold: Not tested Proprioception: Appears Intact Coordination Gross Motor Movements are Fluid and Coordinated: Yes Fine Motor Movements are Fluid and Coordinated: No Heel Shin Test: Pt continues to have some decreased fluidity of movement, however feel its due to weakness in R hip.  Motor  Motor Motor: Hemiplegia Motor - Discharge Observations: Weakness in RUE>RLE, continues to have posterior pelvic tilt in sitting and slight posterior lean at times in standing.   Mobility Bed Mobility Bed Mobility: Supine to Sit;Sit to Supine Supine to Sit: 5: Supervision Supine to  Sit Details: Verbal cues for precautions/safety Sit to Supine: 5: Supervision Sit to Supine - Details: Verbal cues for precautions/safety Transfers Transfers: Yes Sit to Stand: 5: Supervision Sit to Stand Details: Verbal cues for sequencing;Verbal cues for technique Stand to Sit: 5: Supervision Stand to Sit Details (indicate cue type  and reason): Verbal cues for precautions/safety Stand Pivot Transfers: 5: Supervision Stand Pivot Transfer Details: Verbal cues for sequencing;Verbal cues for technique;Verbal cues for precautions/safety Locomotion  Ambulation Ambulation: Yes Ambulation/Gait Assistance: 5: Supervision Ambulation Distance (Feet): 150 Feet Assistive device: Rolling walker Ambulation/Gait Assistance Details: Verbal cues for precautions/safety Gait Gait: Yes Gait Pattern: Impaired Gait Pattern: Decreased weight shift to left;Decreased hip/knee flexion - right;Step-through pattern;Decreased stride length;Trunk flexed Stairs / Additional Locomotion Stairs: Yes Stairs Assistance: 4: Min guard Stairs Assistance Details: Verbal cues for sequencing;Verbal cues for technique;Verbal cues for precautions/safety Stair Management Technique: One rail Right;Sideways;Step to pattern Number of Stairs: 4 Height of Stairs: 6 Wheelchair Mobility Wheelchair Mobility: Yes Wheelchair Assistance: 6: Modified independent (Device/Increase time) Environmental health practitioner: Both lower extermities Wheelchair Parts Management: Supervision/cueing Distance: 155  Trunk/Postural Assessment  Cervical Assessment Cervical Assessment: Within Functional Limits Thoracic Assessment Thoracic Assessment: Within Functional Limits Lumbar Assessment Lumbar Assessment: Within Functional Limits Postural Control Postural Limitations: Pt demonstrates rounded shoulders and posterior pelvic tilt in sitting  Balance Balance Balance Assessed: Yes Standardized Balance Assessment Standardized Balance Assessment: Berg  Balance Test Berg Balance Test Sit to Stand: Able to stand  independently using hands Standing Unsupported: Able to stand 2 minutes with supervision Sitting with Back Unsupported but Feet Supported on Floor or Stool: Able to sit safely and securely 2 minutes Stand to Sit: Sits safely with minimal use of hands Transfers: Able to transfer safely, definite need of hands Standing Unsupported with Eyes Closed: Able to stand 10 seconds with supervision Standing Ubsupported with Feet Together: Able to place feet together independently and stand for 1 minute with supervision From Standing, Reach Forward with Outstretched Arm: Can reach forward >12 cm safely (5") From Standing Position, Pick up Object from Floor: Unable to try/needs assist to keep balance From Standing Position, Turn to Look Behind Over each Shoulder: Looks behind one side only/other side shows less weight shift Turn 360 Degrees: Needs close supervision or verbal cueing Standing Unsupported, Alternately Place Feet on Step/Stool: Needs assistance to keep from falling or unable to try Standing Unsupported, One Foot in Front: Able to plae foot ahead of the other independently and hold 30 seconds Standing on One Leg: Unable to try or needs assist to prevent fall Total Score: 33 Dynamic Sitting Balance Dynamic Sitting - Balance Support: No upper extremity supported Dynamic Sitting - Level of Assistance: 6: Modified independent (Device/Increase time) Dynamic Standing Balance Dynamic Standing - Balance Support: Right upper extremity supported Dynamic Standing - Level of Assistance: 5: Stand by assistance Extremity Assessment      RLE Assessment RLE Assessment: Exceptions to [facility] RLE Strength RLE Overall Strength: Deficits RLE Overall Strength Comments: hip flex 3+/5, hip ext 4/5, knee flex 4/5, knee ext 4/5, DF 3+/5, PF 4/5 LLE Assessment LLE Assessment: Within Functional Limits  See FIM for current functional status  Denice Bors 10/08/2013, 12:31 PM

## 2013-10-08 NOTE — Progress Notes (Signed)
Social Work Discharge Note Discharge Note  The overall goal for the admission was met for:   Discharge location: Yes-HOME WITH WIFE WHO CAN PROVIDE 24 HR CARE  Length of Stay: Yes-18 DAYS  Discharge activity level: Yes-SUPERVISION/MIN LEVEL  Home/community participation: Yes  Services provided included: MD, RD, PT, OT, SLP, RN, TR, Pharmacy and [place]: Private Insurance: [place]  Follow-up services arranged: Home Health: [facility]-PT,OT,SPT,RN, DME: [employer] [place] [facility] and Patient/Family has no preference for HH/DME agencies  Comments (or additional information):WIFE TRAINED AND [place]  Patient/Family verbalized understanding of follow-up arrangements: Yes  Individual responsible for coordination of the follow-up plan: DIANNE-WIFE  Confirmed correct DME delivered: Elease Hashimoto 10/08/2013    Elease Hashimoto

## 2013-10-08 NOTE — Progress Notes (Signed)
This skilled treatment note is being signed for Gunnar Fusi, M.A., [place] who has reviewed it and is in agreement.   Weston Anna, M.A., CCC-SLP  [phone]

## 2013-10-15 ENCOUNTER — Ambulatory Visit (INDEPENDENT_AMBULATORY_CARE_PROVIDER_SITE_OTHER): Payer: Commercial Managed Care - HMO | Admitting: Family Medicine

## 2013-10-15 ENCOUNTER — Encounter: Payer: Self-pay | Admitting: Family Medicine

## 2013-10-15 VITALS — BP 100/60 | HR 58 | Temp 96.7°F | Resp 18 | Ht 70.75 in | Wt 264.0 lb

## 2013-10-15 DIAGNOSIS — Z09 Encounter for follow-up examination after completed treatment for conditions other than malignant neoplasm: Secondary | ICD-10-CM

## 2013-10-15 DIAGNOSIS — I639 Cerebral infarction, unspecified: Secondary | ICD-10-CM

## 2013-10-15 DIAGNOSIS — I6350 Cerebral infarction due to unspecified occlusion or stenosis of unspecified cerebral artery: Secondary | ICD-10-CM

## 2013-10-15 NOTE — Progress Notes (Signed)
Subjective:    Patient ID: Brad Taylor, male    DOB: 1951-08-04, 63 y.o.   MRN: [id number]  HPI Patient was admitted to the hospital with left-sided ischemic CVA and right-sided weakness and dysarthria. He was admitted February 11 and discharged from acute inpatient rehabilitation on March 6. I have copied relevant portions of the discharge summary and included them below for reference: Indication for Hospitalization: new RUE weakness, slurred speech, facial droop, concern for acute stroke  Discharge Diagnoses/Problem List:  Acute ischemic stroke (L-posterior limb internal capsule), residual RUE weakness and dysarthria - Improved  Possible Paroxysmal Atrial Fibrillation, unconfirmed  Type 2 DM, well-controlled  Elevated BP - Improved  Psych: Depression/PTSD  Hypothyroidism  GERD  HLD  COPD - Stable  Bilateral LE ulcers, secondary to DM2  Disposition: CIR  Discharge Condition: Stable  Brief Hospital Course:  KRITHIK MAPEL is a 63 y.o. male who presented with right facial droop, slurred speech, and right upper extremity weakness concerning for CVA, found to have acute ischemic stroke. PMH is significant for COPD, DM-2, Hypothyroidism, GERD, Depression, PTSD, and HLD.  # Acute ischemic stroke (L-posterior limb internal capsule), residual RUE weakness and dysarthria - Improved  Presented to ED as Code Stroke, no prior h/o CVA, previously on home ASA 81 daily, initial work-up with Head CT negative for acute bleed / infarct, not tPA candidate d/t delayed presentation, NIHSS 4 with some noted improvement, treated with ASA 325, and Neurology continued to follow. admiitted to telemetry, obtained complete stroke work-up with MRI/MRA (confirm acute ischemic infarct and small vessel disease), Carotid Dopplers (nml) and ECHO (nml), Risk stratification labs (HgbA1c, Lipid Panel, TSH, results listed below). Placed on DAPT with ASA 81 + Plavix 75mg  daily (plan for 21 days, then switch to Plavix only).  PT/OT/SLP evals recommend CIR, who accepted patient, however d/t insurance clearance pt unable to be admitted over the weekend. Prior to transfer to [facility] service, pt experienced some worsening of RUE weakness, obtained repeat Head CT (09/19/13) without extension / hemorrhage. Patient transferred to [facility] service for continued rehab prior to discharge to home with HHPT. Overall, suspect etiology of CVA is embolic vs small vessel disease, and recommend outpatient cardiac monitoring with loop monitor to potentially capture paroxysmal atrial fibrillation.  # Possible Paroxysmal Atrial Fibrillation, unconfirmed  On admission, in the ED, patient appeared to be in atrial fibrillation on the cardiac monitor, concern for potential PAF that may have led to embolic cause of CVA. Initial EKG obtained and revealed sinus arrythmia with 1st degree AV block, and repeat EKG in AM with Sinus bradycardia, normal p waves, no evidence of irregularity or AFib. Remained on telemetry for several days without events. Recommend outpatient follow-up for event / rhythm monitor to capture potential PAF as etiology.  # Type 2 DM, well controlled  HbgA1c 7.4, initially due to relatively low CBG on admission (90-140) did not resume home Lantus 60u BID, only covered with moderate SSI. Patient had previously not required any Lantus, resumed low dose Lantus 20u nightly + SSI, may titrate as needed.  # Mildly Elevated BP - Stable  No prior h/o dx HTN and did not take any anti-HTN meds prior to admission, allowed permissive HTN 24 hrs after CVA. Overall, BP has remained relatively stable. Given h/o DM2 and some mild BP elevations SBP 150s, started low dose ACEi with Lisinopril 10mg  daily.  # Hypothyroidism  Continued home Synthroid  # GERD  Continued PPI  # Psych: Depression/PTSD  Continued home medications as patient has been on them for years: Trazodone, Divalproex, BuSpar, Bupropion, Venlafaxine.  # HLD  Prior home dose Crestor 20mg  daily,  which was switched to Atorvastatin 80mg  daily.  # COPD - Stable  Patient is only on albuterol at home which his wife states she uses very infrequently. No evidence of acute exacerbation currently.  # B/l LE Ulcerations, secondary to h/o DM2  Consulted wound care, managing with dressings and topical treatment. Evidence of previously healed RLE ulceration. Persistent LLE ulceration with recommendation for Unna boot.  Issues for Follow Up:  1. Secondary Stroke Prevention - continue ASA 81mg  daily and Plavix 75mg  daily x 21 days, starting 10/07/13 DC ASA and only take Plavix 75mg  daily  2. Cardiac Monitor - Recommend referral for outpatient cardiology event monitor to dx vs r/o potential paroxysmal atrial fibrillation, which may have led to CVA.  3. DM Management - Home insulin regimen of Lantus 60u BID seems excessively high for patient, given CBGs stable during hospitalization with moderate SSI and low Lantus 20u nightly dosing. Consider increasing home dose Lantus if needed.  4. BP Management - No h/o HTN. Started low dose Lisinopril 10mg  daily for renal protection in DM pt  5. Continued PT  Significant Procedures: none  Significant Labs and Imaging:  Recent Labs  Lab  09/15/13 1833  09/15/13 1845   WBC  10.2  --   HGB  15.9  17.0   HCT  45.3  50.0   PLT  175  --   Recent Labs  Lab  09/15/13 1833  09/15/13 1845   NA  137  135*   K  5.0  4.6   CL  95*  95*   CO2  30  --   GLUCOSE  125*  124*   BUN  10  9   CREATININE  0.83  1.00   CALCIUM  9.2  --   ALKPHOS  64  --   AST  13  --   ALT  12  --   ALBUMIN  3.6  --   Hb A1C (7.4)  TSH (4.045)  Lipid panel (TC 216 / TG 288 / HDL 35 / LDL 123)  Troponin-I - negative  UA - negative  UTox - negative  Imaging/Diagnostic Tests:  2/11 Portable CXR 1v  IMPRESSION:  Negative except for mild cardiac enlargement  2/11 Head CT w/o contrast  IMPRESSION:  No focal acute intracranial abnormality identified. Chronic diffuse  atrophy.  Chronic bilateral periventricular white matter small vessel  ischemic change.  2/12 MRI Brain / MRA Head  IMPRESSION:  MRI HEAD:  1. Acute ischemic infarct involving the posterior limb of the left  internal capsule.  2. Additional subcentimeter ischemic infarct within the deep white  matter of the left centrum semi ovale.  3. Remote subcentimeter lacunar infarcts involving the bilateral  basal ganglia bilaterally.  4. Moderate cerebral atrophy and chronic microvascular ischemic  changes.  MRA HEAD:  1. Mild multi focal irregularity within the cavernous ICAs  bilaterally, left greater than right, likely related to underlying  atherosclerotic disease. No high-grade flow-limiting stenosis or  proximal branch arterial occlusion identified.  2. No intracranial aneurysm.  2/13 ECHO  - Left ventricle: The cavity size was normal. Systolic function was normal. The estimated ejection fraction was in the range of 60% to 65%. Wall motion was normal; there were no regional wall motion abnormalities. - Left atrium: The atrium was moderately dilated. - Atrial septum:  No defect or patent foramen ovale was Identified.  2/15 Head CT w/o contrast  IMPRESSION:  Evidence of patient's known ovoid acute infarct over the posterior  limb of the left internal capsule measuring approximately 1.2 x 1.6  cm.  Chronic ischemic microvascular disease. Old small lacunar infarct of  the left caudate.  Results/Tests Pending at Time of Discharge: none  Discharge Medications:    Medication List     STOP taking these medications       glipiZIDE 5 MG tablet    Commonly known as: GLUCOTROL    rosuvastatin 20 MG tablet    Commonly known as: CRESTOR    Replaced by: atorvastatin 80 MG tablet     TAKE these medications       aspirin 81 MG tablet    Take 81 mg by mouth daily.    atorvastatin 80 MG tablet    Commonly known as: LIPITOR    Take 1 tablet (80 mg total) by mouth daily at 6 PM.    buPROPion 150 MG  12 hr tablet    Commonly known as: WELLBUTRIN SR    Take 150 mg by mouth daily.    busPIRone 10 MG tablet    Commonly known as: BUSPAR    Take 5 mg by mouth 2 (two) times daily as needed (for anxiety).    clopidogrel 75 MG tablet    Commonly known as: PLAVIX    Take 1 tablet (75 mg total) by mouth daily with breakfast.    divalproex 500 MG DR tablet    Commonly known as: DEPAKOTE    Take 1,000 mg by mouth at bedtime.    etodolac 400 MG tablet    Commonly known as: LODINE    Take 400 mg by mouth 2 (two) times daily.    insulin glargine 100 UNIT/ML injection    Commonly known as: LANTUS    Inject 0.2 mLs (20 Units total) into the skin at bedtime.    levothyroxine 125 MCG tablet    Commonly known as: SYNTHROID, LEVOTHROID    Take 125 mcg by mouth daily before breakfast.    lisinopril 10 MG tablet    Commonly known as: PRINIVIL,ZESTRIL    Take 1 tablet (10 mg total) by mouth daily.    RABEprazole 20 MG tablet    Commonly known as: ACIPHEX    Take 40 mg by mouth daily before breakfast.    Silver Nitrate Soln    1 application by Does not apply route every other day.    terazosin 10 MG capsule    Commonly known as: HYTRIN    Take 10 mg by mouth at bedtime.    traZODone 100 MG tablet    Commonly known as: DESYREL    Take 300 mg by mouth at bedtime.    venlafaxine XR 150 MG 24 hr capsule    Commonly known as: EFFEXOR-XR    Take 150 mg by mouth 2 (two) times daily.    The current plan was to continue dual antiplatelet therapy for a total of 3 weeks and then switch to Plavix 75 mg by mouth daily for secondary prevention of ischemic CVA. I do not see that the patient has had cardiology followup for evaluation of possible paroxysmal atrial fibrillation as a cause of embolic stroke.  This was pending at the time of discharge.  Patient's LDL cholesterol is elevated at 123 in the hospital. His LDL cholesterol be less than 70 after his stroke.  However the patient has discontinued Lipitor 80 mg  by mouth daily and decrease to 5 mg by mouth [facility] elevated liver function test. The blood drawn yesterday. I do not have the results to review. The right states that he was not fasting for the blood work otherwise the patient is doing well at home. He is receiving physical therapy occupational therapy and speech therapy at. He continues to have dysarthric speech. He continues to have weakness in his right upper strandy although he is walking well. Past Medical History  Diagnosis Date  . COPD (chronic obstructive pulmonary disease)   . Diabetes mellitus without complication   . Hypothyroidism   . GERD (gastroesophageal reflux disease)   . Ulcer   . Post traumatic stress disorder   . Depression   . Hyperlipidemia    Current Outpatient Prescriptions on File Prior to Visit  Medication Sig Dispense Refill  . atorvastatin (LIPITOR) 80 MG tablet Take 1 tablet (80 mg total) by mouth daily at 6 PM.  30 tablet  1  . buPROPion (WELLBUTRIN SR) 150 MG 12 hr tablet Take 1 tablet (150 mg total) by mouth daily.  30 tablet  1  . busPIRone (BUSPAR) 10 MG tablet Take 0.5 tablets (5 mg total) by mouth 2 (two) times daily as needed (for anxiety).  60 tablet  0  . clopidogrel (PLAVIX) 75 MG tablet Take 1 tablet (75 mg total) by mouth daily with breakfast.  30 tablet  1  . divalproex (DEPAKOTE) 500 MG DR tablet Take 2 tablets (1,000 mg total) by mouth at bedtime.  30 tablet  1  . levothyroxine (SYNTHROID, LEVOTHROID) 125 MCG tablet Take 1 tablet (125 mcg total) by mouth daily before breakfast.  30 tablet  1  . lisinopril (PRINIVIL,ZESTRIL) 10 MG tablet Take 1 tablet (10 mg total) by mouth daily.  30 tablet  1  . RABEprazole (ACIPHEX) 20 MG tablet Take 2 tablets (40 mg total) by mouth daily before breakfast.  30 tablet  1  . traZODone (DESYREL) 100 MG tablet Take 3 tablets (300 mg total) by mouth at bedtime.  30 tablet  0  . venlafaxine XR (EFFEXOR-XR) 150 MG 24 hr capsule Take 1  capsule (150 mg total) by mouth daily with breakfast.  30 capsule  1   No current facility-administered medications on file prior to visit.   No Known Allergies History   Social History  . Marital Status: Married    Spouse Name: N/A    Number of Children: N/A  . Years of Education: N/A   Occupational History  . Not on file.   Social History Main Topics  . Smoking status: Former Smoker -- 1.00 packs/day    Types: Cigarettes  . Smokeless tobacco: Former Systems developer    Quit date: 05/04/2013  . Alcohol Use: No     Comment: Former heavy drinker, been sober for 10 years  . Drug Use: No  . Sexual Activity: Not on file   Other Topics Concern  . Not on file   Social History Narrative   Retired Dealer.       Review of Systems  All other systems reviewed and are negative.       Objective:   Physical Exam  Vitals reviewed. Constitutional: He is oriented to person, place, and time. He appears well-developed and well-nourished.  Cardiovascular: Normal rate, regular rhythm, normal heart sounds and intact distal pulses.  Exam reveals no gallop and  no friction rub.   No murmur heard. Pulmonary/Chest: Effort normal and breath sounds normal. No respiratory distress. He has no wheezes. He has no rales.  Abdominal: Soft. Bowel sounds are normal. He exhibits no distension and no mass. There is no tenderness. There is no rebound and no guarding.  Neurological: He is alert and oriented to person, place, and time. He has normal reflexes. He displays normal reflexes. No cranial nerve deficit. He exhibits abnormal muscle tone. Coordination normal.   patient is unable to move his right arm. He also has dysarthric speech. He is able to add without assistance.        Assessment & Plan:  1. Hospital discharge follow-up - Ambulatory referral to Cardiology  2. CVA (cerebral infarction) - Ambulatory referral to Cardiology  Consult cardiology for an outpatient evaluation and an event monitor  to evaluate for paroxysmal atrial fibrillation as requested in the hospital. The patient's fasting blood sugars are ranging between 100-130. He is currently on Lantus 45 units twice a day.  He is not checking his 2 hour postprandial sugars. I have recommended that his wife begin checking his 2 hour postprandial sugars. If his two-hour postprandial sugars are greater than 160 I would add mealtime coverage with NovoLog. I also think it is extremely important the patient take the high-dose Lipitor to decrease his transit future stroke. Therefore I requested that I received a copy of his lab work so that I can review it. His liver function tests are to be elevated, obtain a viral hepatitis panel as well as a right upper quadrant ultrasound to better characterize the cause of his liver function test abnormalities. Want to increase the statin if possible to try to achieve a goal LDL less than 70.  The vaginal drain it copy of blood work. They will return fasting next week to repeat the fasting lipid panel and also a viral hepatitis panel.

## 2013-10-21 DIAGNOSIS — L97809 Non-pressure chronic ulcer of other part of unspecified lower leg with unspecified severity: Secondary | ICD-10-CM

## 2013-10-21 DIAGNOSIS — I83219 Varicose veins of right lower extremity with both ulcer of unspecified site and inflammation: Secondary | ICD-10-CM

## 2013-10-21 DIAGNOSIS — L97929 Non-pressure chronic ulcer of unspecified part of left lower leg with unspecified severity: Secondary | ICD-10-CM

## 2013-10-21 DIAGNOSIS — L97919 Non-pressure chronic ulcer of unspecified part of right lower leg with unspecified severity: Secondary | ICD-10-CM

## 2013-10-21 DIAGNOSIS — I83229 Varicose veins of left lower extremity with both ulcer of unspecified site and inflammation: Secondary | ICD-10-CM

## 2013-10-21 DIAGNOSIS — I69922 Dysarthria following unspecified cerebrovascular disease: Secondary | ICD-10-CM

## 2013-10-21 DIAGNOSIS — I69939 Monoplegia of upper limb following unspecified cerebrovascular disease affecting unspecified side: Secondary | ICD-10-CM

## 2013-10-22 ENCOUNTER — Telehealth: Payer: Self-pay

## 2013-10-22 NOTE — Telephone Encounter (Signed)
ok 

## 2013-10-22 NOTE — Telephone Encounter (Signed)
FYI:: Gina @ [facility] called to inform you that patient requested that skilled nursing be D/C. His last visit was on 3/18, but patient will continue to receive therapy.

## 2013-10-26 ENCOUNTER — Telehealth: Payer: Self-pay | Admitting: *Deleted

## 2013-10-26 NOTE — Telephone Encounter (Signed)
TC to Dr. Leonie Man about pt and dental work to be done.   If not stopping plavix, can go ahead with crown prep and filling.   I relayed to [facility].  She verbalized understanding.   (they were not stopping plavix).

## 2013-10-26 NOTE — Telephone Encounter (Signed)
Calling about pt having crown on, but also having additional work,  filling and other crown.  Pt on plavix.  [phone].

## 2013-10-27 ENCOUNTER — Other Ambulatory Visit: Payer: Commercial Managed Care - HMO

## 2013-10-27 DIAGNOSIS — R7989 Other specified abnormal findings of blood chemistry: Secondary | ICD-10-CM

## 2013-10-27 DIAGNOSIS — E785 Hyperlipidemia, unspecified: Secondary | ICD-10-CM

## 2013-10-27 DIAGNOSIS — R945 Abnormal results of liver function studies: Principal | ICD-10-CM

## 2013-10-27 DIAGNOSIS — Z79899 Other long term (current) drug therapy: Secondary | ICD-10-CM

## 2013-10-27 LAB — LIPID PANEL
Cholesterol: 223 mg/dL — ABNORMAL HIGH (ref 0–200)
HDL: 42 mg/dL (ref >39)
LDL Cholesterol: 138 mg/dL — ABNORMAL HIGH (ref 0–99)
TRIGLYCERIDES: 216 mg/dL — AB (ref <150)
Total CHOL/HDL Ratio: 5.3 Ratio
VLDL: 43 mg/dL — AB (ref 0–40)

## 2013-10-28 LAB — HEPATITIS PANEL, ACUTE
HCV Ab: NEGATIVE
HEP B C IGM: NONREACTIVE
Hep A IgM: NONREACTIVE
Hepatitis B Surface Ag: NEGATIVE

## 2013-10-29 ENCOUNTER — Encounter: Payer: Self-pay | Admitting: Family Medicine

## 2013-10-29 ENCOUNTER — Ambulatory Visit (INDEPENDENT_AMBULATORY_CARE_PROVIDER_SITE_OTHER): Payer: Commercial Managed Care - HMO | Admitting: Family Medicine

## 2013-10-29 ENCOUNTER — Telehealth: Payer: Self-pay

## 2013-10-29 VITALS — BP 106/64 | HR 74 | Temp 97.5°F | Resp 18 | Ht 70.75 in | Wt 267.0 lb

## 2013-10-29 DIAGNOSIS — IMO0001 Reserved for inherently not codable concepts without codable children: Secondary | ICD-10-CM

## 2013-10-29 DIAGNOSIS — R7989 Other specified abnormal findings of blood chemistry: Secondary | ICD-10-CM

## 2013-10-29 DIAGNOSIS — L0231 Cutaneous abscess of buttock: Secondary | ICD-10-CM

## 2013-10-29 DIAGNOSIS — R945 Abnormal results of liver function studies: Secondary | ICD-10-CM

## 2013-10-29 DIAGNOSIS — L03317 Cellulitis of buttock: Principal | ICD-10-CM

## 2013-10-29 DIAGNOSIS — E1165 Type 2 diabetes mellitus with hyperglycemia: Secondary | ICD-10-CM

## 2013-10-29 MED ORDER — SULFAMETHOXAZOLE-TMP DS 800-160 MG PO TABS: 1.0000 | ORAL_TABLET | Freq: Two times a day (BID) | ORAL | Status: DC

## 2013-10-29 NOTE — Telephone Encounter (Signed)
Brad Taylor (ST @ [facility]) is requesting a verbal order to extend ST for 1 more visit. Is this okay?

## 2013-10-29 NOTE — Telephone Encounter (Signed)
ok 

## 2013-10-29 NOTE — Progress Notes (Signed)
Subjective:    Patient ID: Brad Taylor, male    DOB: 1950/12/26, 63 y.o.   MRN: [id number]  HPI 10/15/13 Patient was admitted to the hospital with left-sided ischemic CVA and right-sided weakness and dysarthria. He was admitted February 11 and discharged from acute inpatient rehabilitation on March 6. I have copied relevant portions of the discharge summary and included them below for reference: Indication for Hospitalization: new RUE weakness, slurred speech, facial droop, concern for acute stroke  Discharge Diagnoses/Problem List:  Acute ischemic stroke (L-posterior limb internal capsule), residual RUE weakness and dysarthria - Improved  Possible Paroxysmal Atrial Fibrillation, unconfirmed  Type 2 DM, well-controlled  Elevated BP - Improved  Psych: Depression/PTSD  Hypothyroidism  GERD  HLD  COPD - Stable  Bilateral LE ulcers, secondary to DM2  Disposition: CIR  Discharge Condition: Stable  Brief Hospital Course:  Brad Taylor is a 63 y.o. male who presented with right facial droop, slurred speech, and right upper extremity weakness concerning for CVA, found to have acute ischemic stroke. PMH is significant for COPD, DM-2, Hypothyroidism, GERD, Depression, PTSD, and HLD.  # Acute ischemic stroke (L-posterior limb internal capsule), residual RUE weakness and dysarthria - Improved  Presented to ED as Code Stroke, no prior h/o CVA, previously on home ASA 81 daily, initial work-up with Head CT negative for acute bleed / infarct, not tPA candidate d/t delayed presentation, NIHSS 4 with some noted improvement, treated with ASA 325, and Neurology continued to follow. admiitted to telemetry, obtained complete stroke work-up with MRI/MRA (confirm acute ischemic infarct and small vessel disease), Carotid Dopplers (nml) and ECHO (nml), Risk stratification labs (HgbA1c, Lipid Panel, TSH, results listed below). Placed on DAPT with ASA 81 + Plavix 75mg  daily (plan for 21 days, then switch to  Plavix only). PT/OT/SLP evals recommend CIR, who accepted patient, however d/t insurance clearance pt unable to be admitted over the weekend. Prior to transfer to [facility] service, pt experienced some worsening of RUE weakness, obtained repeat Head CT (09/19/13) without extension / hemorrhage. Patient transferred to [facility] service for continued rehab prior to discharge to home with HHPT. Overall, suspect etiology of CVA is embolic vs small vessel disease, and recommend outpatient cardiac monitoring with loop monitor to potentially capture paroxysmal atrial fibrillation.  # Possible Paroxysmal Atrial Fibrillation, unconfirmed  On admission, in the ED, patient appeared to be in atrial fibrillation on the cardiac monitor, concern for potential PAF that may have led to embolic cause of CVA. Initial EKG obtained and revealed sinus arrythmia with 1st degree AV block, and repeat EKG in AM with Sinus bradycardia, normal p waves, no evidence of irregularity or AFib. Remained on telemetry for several days without events. Recommend outpatient follow-up for event / rhythm monitor to capture potential PAF as etiology.  # Type 2 DM, well controlled  HbgA1c 7.4, initially due to relatively low CBG on admission (90-140) did not resume home Lantus 60u BID, only covered with moderate SSI. Patient had previously not required any Lantus, resumed low dose Lantus 20u nightly + SSI, may titrate as needed.  # Mildly Elevated BP - Stable  No prior h/o dx HTN and did not take any anti-HTN meds prior to admission, allowed permissive HTN 24 hrs after CVA. Overall, BP has remained relatively stable. Given h/o DM2 and some mild BP elevations SBP 150s, started low dose ACEi with Lisinopril 10mg  daily.  # Hypothyroidism  Continued home Synthroid  # GERD  Continued PPI  # Psych: Depression/PTSD  Continued home medications as patient has been on them for years: Trazodone, Divalproex, BuSpar, Bupropion, Venlafaxine.  # HLD  Prior home dose  Crestor 20mg  daily, which was switched to Atorvastatin 80mg  daily.  # COPD - Stable  Patient is only on albuterol at home which his wife states she uses very infrequently. No evidence of acute exacerbation currently.  # B/l LE Ulcerations, secondary to h/o DM2  Consulted wound care, managing with dressings and topical treatment. Evidence of previously healed RLE ulceration. Persistent LLE ulceration with recommendation for Unna boot.  Issues for Follow Up:  1. Secondary Stroke Prevention - continue ASA 81mg  daily and Plavix 75mg  daily x 21 days, starting 10/07/13 DC ASA and only take Plavix 75mg  daily  2. Cardiac Monitor - Recommend referral for outpatient cardiology event monitor to dx vs r/o potential paroxysmal atrial fibrillation, which may have led to CVA.  3. DM Management - Home insulin regimen of Lantus 60u BID seems excessively high for patient, given CBGs stable during hospitalization with moderate SSI and low Lantus 20u nightly dosing. Consider increasing home dose Lantus if needed.  4. BP Management - No h/o HTN. Started low dose Lisinopril 10mg  daily for renal protection in DM pt  5. Continued PT  Significant Procedures: none  Significant Labs and Imaging:  Recent Labs  Lab  09/15/13 1833  09/15/13 1845   WBC  10.2  --   HGB  15.9  17.0   HCT  45.3  50.0   PLT  175  --   Recent Labs  Lab  09/15/13 1833  09/15/13 1845   NA  137  135*   K  5.0  4.6   CL  95*  95*   CO2  30  --   GLUCOSE  125*  124*   BUN  10  9   CREATININE  0.83  1.00   CALCIUM  9.2  --   ALKPHOS  64  --   AST  13  --   ALT  12  --   ALBUMIN  3.6  --   Hb A1C (7.4)  TSH (4.045)  Lipid panel (TC 216 / TG 288 / HDL 35 / LDL 123)  Troponin-I - negative  UA - negative  UTox - negative  Imaging/Diagnostic Tests:  2/11 Portable CXR 1v  IMPRESSION:  Negative except for mild cardiac enlargement  2/11 Head CT w/o contrast  IMPRESSION:  No focal acute intracranial abnormality identified. Chronic  diffuse  atrophy. Chronic bilateral periventricular white matter small vessel  ischemic change.  2/12 MRI Brain / MRA Head  IMPRESSION:  MRI HEAD:  1. Acute ischemic infarct involving the posterior limb of the left  internal capsule.  2. Additional subcentimeter ischemic infarct within the deep white  matter of the left centrum semi ovale.  3. Remote subcentimeter lacunar infarcts involving the bilateral  basal ganglia bilaterally.  4. Moderate cerebral atrophy and chronic microvascular ischemic  changes.  MRA HEAD:  1. Mild multi focal irregularity within the cavernous ICAs  bilaterally, left greater than right, likely related to underlying  atherosclerotic disease. No high-grade flow-limiting stenosis or  proximal branch arterial occlusion identified.  2. No intracranial aneurysm.  2/13 ECHO  - Left ventricle: The cavity size was normal. Systolic function was normal. The estimated ejection fraction was in the range of 60% to 65%. Wall motion was normal; there were no regional wall motion abnormalities. - Left atrium: The atrium was moderately dilated. - Atrial septum:  No defect or patent foramen ovale was Identified.  2/15 Head CT w/o contrast  IMPRESSION:  Evidence of patient's known ovoid acute infarct over the posterior  limb of the left internal capsule measuring approximately 1.2 x 1.6  cm.  Chronic ischemic microvascular disease. Old small lacunar infarct of  the left caudate.  Results/Tests Pending at Time of Discharge: none  Discharge Medications:    Medication List     STOP taking these medications       glipiZIDE 5 MG tablet    Commonly known as: GLUCOTROL    rosuvastatin 20 MG tablet    Commonly known as: CRESTOR    Replaced by: atorvastatin 80 MG tablet     TAKE these medications       aspirin 81 MG tablet    Take 81 mg by mouth daily.    atorvastatin 80 MG tablet    Commonly known as: LIPITOR    Take 1 tablet (80 mg total) by mouth daily at 6 PM.      buPROPion 150 MG 12 hr tablet    Commonly known as: WELLBUTRIN SR    Take 150 mg by mouth daily.    busPIRone 10 MG tablet    Commonly known as: BUSPAR    Take 5 mg by mouth 2 (two) times daily as needed (for anxiety).    clopidogrel 75 MG tablet    Commonly known as: PLAVIX    Take 1 tablet (75 mg total) by mouth daily with breakfast.    divalproex 500 MG DR tablet    Commonly known as: DEPAKOTE    Take 1,000 mg by mouth at bedtime.    etodolac 400 MG tablet    Commonly known as: LODINE    Take 400 mg by mouth 2 (two) times daily.    insulin glargine 100 UNIT/ML injection    Commonly known as: LANTUS    Inject 0.2 mLs (20 Units total) into the skin at bedtime.    levothyroxine 125 MCG tablet    Commonly known as: SYNTHROID, LEVOTHROID    Take 125 mcg by mouth daily before breakfast.    lisinopril 10 MG tablet    Commonly known as: PRINIVIL,ZESTRIL    Take 1 tablet (10 mg total) by mouth daily.    RABEprazole 20 MG tablet    Commonly known as: ACIPHEX    Take 40 mg by mouth daily before breakfast.    Silver Nitrate Soln    1 application by Does not apply route every other day.    terazosin 10 MG capsule    Commonly known as: HYTRIN    Take 10 mg by mouth at bedtime.    traZODone 100 MG tablet    Commonly known as: DESYREL    Take 300 mg by mouth at bedtime.    venlafaxine XR 150 MG 24 hr capsule    Commonly known as: EFFEXOR-XR    Take 150 mg by mouth 2 (two) times daily.    The current plan was to continue dual antiplatelet therapy for a total of 3 weeks and then switch to Plavix 75 mg by mouth daily for secondary prevention of ischemic CVA. I do not see that the patient has had cardiology followup for evaluation of possible paroxysmal atrial fibrillation as a cause of embolic stroke.  This was pending at the time of discharge.  Patient's LDL cholesterol is elevated at 123 in the hospital. His LDL cholesterol be less than 70 after his  stroke. However the patient has  discontinued Lipitor 80 mg by mouth daily and decrease to 5 mg by [facility] liver function test. The blood drawn yesterday. I do not have the results to review. The right states that he was not fasting for the blood work otherwise the patient is doing well at home. He is receiving physical therapy occupational therapy and speech therapy at. He continues to have dysarthric speech. He continues to have weakness in his right upper strandy although he is walking well.  At that time, my plan was: 1. Hospital discharge follow-up - Ambulatory referral to Cardiology  2. CVA (cerebral infarction) - Ambulatory referral to Cardiology  Consult cardiology for an outpatient evaluation and an event monitor to evaluate for paroxysmal atrial fibrillation as requested in the hospital. The patient's fasting blood sugars are ranging between 100-130. He is currently on Lantus 45 units twice a day.  He is not checking his 2 hour postprandial sugars. I have recommended that his wife begin checking his 2 hour postprandial sugars. If his two-hour postprandial sugars are greater than 160 I would add mealtime coverage with NovoLog. I also think it is extremely important the patient take the high-dose Lipitor to decrease his transit future stroke. Therefore I requested that I received a copy of his lab work so that I can review it. His liver function tests are to be elevated, obtain a viral hepatitis panel as well as a right upper quadrant ultrasound to better characterize the cause of his liver function test abnormalities. Want to increase the statin if possible to try to achieve a goal LDL less than 70.  The vaginal drain it copy of blood work. They will return fasting next week to repeat the fasting lipid panel and also a viral hepatitis panel.  10/29/13 Patient's LDL cholesterol is elevated at greater than 130. However his wife provided me with lab work from the [place]. His most recent  hemoglobin A1c checked March 12 was 8.4. His alkaline phosphatase was 240. His AST was 124 and his ALT was 154. [facility] They have also wean the patient off Depakote. He is to recheck labs the first week in April at the [place]. His fasting blood sugar however is around 170.  His 2 hour postprandial sugars are greater than 200. His wife has increased his Lantus to 50 units subcutaneous bid.  He also developed a boil on the inner cheek of his left buttocks. The spontaneously drained last night. There is still an erythematous warm tender area.   Past Medical History  Diagnosis Date  . COPD (chronic obstructive pulmonary disease)   . Diabetes mellitus without complication   . Hypothyroidism   . GERD (gastroesophageal reflux disease)   . Ulcer   . Post traumatic stress disorder   . Depression   . Hyperlipidemia    Current Outpatient Prescriptions on File Prior to Visit  Medication Sig Dispense Refill  . atorvastatin (LIPITOR) 80 MG tablet Take 1 tablet (80 mg total) by mouth daily at 6 PM.  30 tablet  1  . buPROPion (WELLBUTRIN SR) 150 MG 12 hr tablet Take 1 tablet (150 mg total) by mouth daily.  30 tablet  1  . busPIRone (BUSPAR) 10 MG tablet Take 0.5 tablets (5 mg total) by mouth 2 (two) times daily as needed (for anxiety).  60 tablet  0  . clopidogrel (PLAVIX) 75 MG tablet Take 1  tablet (75 mg total) by mouth daily with breakfast.  30 tablet  1  . insulin glargine (LANTUS) 100 UNIT/ML injection 45 units every a.m. & qhs      . levothyroxine (SYNTHROID, LEVOTHROID) 125 MCG tablet Take 1 tablet (125 mcg total) by mouth daily before breakfast.  30 tablet  1  . lisinopril (PRINIVIL,ZESTRIL) 10 MG tablet Take 1 tablet (10 mg total) by mouth daily.  30 tablet  1  . RABEprazole (ACIPHEX) 20 MG tablet Take 2 tablets (40 mg total) by mouth daily before breakfast.  30 tablet  1  . traZODone (DESYREL) 100 MG tablet Take 3 tablets (300 mg total) by mouth at bedtime.  30  tablet  0  . venlafaxine XR (EFFEXOR-XR) 150 MG 24 hr capsule Take 1 capsule (150 mg total) by mouth daily with breakfast.  30 capsule  1   No current facility-administered medications on file prior to visit.   No Known Allergies History   Social History  . Marital Status: Married    Spouse Name: N/A    Number of Children: N/A  . Years of Education: N/A   Occupational History  . Not on file.   Social History Main Topics  . Smoking status: Former Smoker -- 1.00 packs/day    Types: Cigarettes  . Smokeless tobacco: Former Systems developer    Quit date: 05/04/2013  . Alcohol Use: No     Comment: Former heavy drinker, been sober for 10 years  . Drug Use: No  . Sexual Activity: Not on file   Other Topics Concern  . Not on file   Social History Narrative   Retired Dealer.       Review of Systems  All other systems reviewed and are negative.       Objective:   Physical Exam  Vitals reviewed. Constitutional: He is oriented to person, place, and time. He appears well-developed and well-nourished.  Cardiovascular: Normal rate, regular rhythm, normal heart sounds and intact distal pulses.  Exam reveals no gallop and no friction rub.   No murmur heard. Pulmonary/Chest: Effort normal and breath sounds normal. No respiratory distress. He has no wheezes. He has no rales.  Abdominal: Soft. Bowel sounds are normal. He exhibits no distension and no mass. There is no tenderness. There is no rebound and no guarding.  Neurological: He is alert and oriented to person, place, and time. He has normal reflexes. No cranial nerve deficit. He exhibits abnormal muscle tone. Coordination normal.   patient is unable to move his right arm. He also has dysarthric speech. He is able to add without assistance.        Assessment & Plan:  1. Cellulitis and abscess of buttock  there is no remaining abscess. There is only cellulitis and a 4 mm opening with the spontaneously drained.  Therefore treat with  Bactrim double strength one by mouth twice a day for 7 days.  - sulfamethoxazole-trimethoprim (BACTRIM DS) 800-160 MG per tablet; Take 1 tablet by mouth 2 (two) times daily.  Dispense: 14 tablet; Refill: 0  2. Elevated LFTs Patient has elevated liver function test. I would not increase his Lipitor to 80 mg a day until this is addressed. His doctor at the [place] is weaning him off Depakote and we'll recheck his labs in April. If his liver function tests are better at that time I would recommend increasing Lipitor to try to bring his goal LDL less than 70.  3. Type II or unspecified type diabetes  mellitus without mention of complication, uncontrolled   I have recommended that his wife increased his Lantus 1 unit twice a day until fasting blood sugars are less than 130. Afterwards she should begin checking his 2 hour postprandial sugars. If they are greater than 200, I would add 7 units of NovoLog with supper and titrate until 2 hour postprandial sugars are less than 200.

## 2013-11-01 NOTE — Telephone Encounter (Signed)
Left message on confidential voicemail to approve extending speech therapy.

## 2013-11-02 ENCOUNTER — Ambulatory Visit (INDEPENDENT_AMBULATORY_CARE_PROVIDER_SITE_OTHER): Payer: Commercial Managed Care - HMO | Admitting: Cardiology

## 2013-11-02 ENCOUNTER — Encounter: Payer: Self-pay | Admitting: Cardiology

## 2013-11-02 ENCOUNTER — Encounter: Payer: Self-pay | Admitting: *Deleted

## 2013-11-02 ENCOUNTER — Encounter (INDEPENDENT_AMBULATORY_CARE_PROVIDER_SITE_OTHER): Payer: Commercial Managed Care - HMO

## 2013-11-02 VITALS — BP 118/72 | HR 97 | Ht 70.75 in | Wt 267.0 lb

## 2013-11-02 DIAGNOSIS — I639 Cerebral infarction, unspecified: Secondary | ICD-10-CM

## 2013-11-02 DIAGNOSIS — I6350 Cerebral infarction due to unspecified occlusion or stenosis of unspecified cerebral artery: Secondary | ICD-10-CM

## 2013-11-02 DIAGNOSIS — I4891 Unspecified atrial fibrillation: Secondary | ICD-10-CM

## 2013-11-02 NOTE — Progress Notes (Signed)
[address], [place], [place]  [address] Phone: [phone] Fax:  [phone]  Date:  11/02/2013   ID:  Brad Taylor, DOB [birth year], MRN [id number]  PCP:  Odette Fraction, MD  Cardiologist:  Fransico Him, MD     History of Present Illness: Brad Taylor is a 63 y.o. male with a history of COPD, DM and dyslipidemia who presents today for cardiac evaluation due to recent left sided ischemic CVA.  He had an extensive workup in the hospital including normal echo and carotid dopplers and MRI/MRA showed an acute ischemic infarct with small vessel disease.  He was placed on DAPT with ASA and Plavix.  He is referred now to be evaluated with an event monitor for silent afib.  Apparently when he first presented to the ER the heart monitor showed ? afib but initial EKG showed NSR with sinus arrhythmia and there was no PAF on tele monitoring while inpatient.  He denies any history of palpitations, chest pain, SOB, DOE or syncope.  He walks several miles daily without any problems.     Wt Readings from Last 3 Encounters:  11/02/13 267 lb (121.11 kg)  10/29/13 267 lb (121.11 kg)  10/15/13 264 lb (119.75 kg)     Past Medical History  Diagnosis Date  . COPD (chronic obstructive pulmonary disease)   . Diabetes mellitus without complication   . Hypothyroidism   . GERD (gastroesophageal reflux disease)   . Ulcer   . Post traumatic stress disorder   . Depression   . Hyperlipidemia     Current Outpatient Prescriptions  Medication Sig Dispense Refill  . atorvastatin (LIPITOR) 10 MG tablet Take 10 mg by mouth daily. Takes 5 mg      . buPROPion (WELLBUTRIN SR) 150 MG 12 hr tablet Take 1 tablet (150 mg total) by mouth daily.  30 tablet  1  . busPIRone (BUSPAR) 10 MG tablet Take 0.5 tablets (5 mg total) by mouth 2 (two) times daily as needed (for anxiety).  60 tablet  0  . clopidogrel (PLAVIX) 75 MG tablet Take 1 tablet (75 mg total) by mouth daily with breakfast.  30 tablet  1  .  insulin glargine (LANTUS) 100 UNIT/ML injection 45 units every a.m. & qhs      . levothyroxine (SYNTHROID, LEVOTHROID) 125 MCG tablet Take 1 tablet (125 mcg total) by mouth daily before breakfast.  30 tablet  1  . lisinopril (PRINIVIL,ZESTRIL) 10 MG tablet Take 1 tablet (10 mg total) by mouth daily.  30 tablet  1  . RABEprazole (ACIPHEX) 20 MG tablet Take 2 tablets (40 mg total) by mouth daily before breakfast.  30 tablet  1  . sulfamethoxazole-trimethoprim (BACTRIM DS) 800-160 MG per tablet Take 1 tablet by mouth 2 (two) times daily.  14 tablet  0  . traZODone (DESYREL) 100 MG tablet Take 3 tablets (300 mg total) by mouth at bedtime.  30 tablet  0  . venlafaxine XR (EFFEXOR-XR) 150 MG 24 hr capsule Take 1 capsule (150 mg total) by mouth daily with breakfast.  30 capsule  1   No current facility-administered medications for this visit.    Allergies:   No Known Allergies  Social History:  The patient  reports that he has quit smoking. His smoking use included Cigarettes. He smoked 1.00 pack per day. He quit smokeless tobacco use about 5 months ago. He reports that he does not drink alcohol or use illicit drugs.   Family  History:  The patient's family history is not on file.   ROS:  Please see the history of present illness.      All other systems reviewed and negative.   PHYSICAL EXAM: VS:  BP 118/72  Pulse 97  Ht 5' 10.75" (1.797 m)  Wt 267 lb (121.11 kg)  BMI 37.50 kg/m2 Well nourished, well developed, in no acute distress HEENT: normal Neck: no JVD Cardiac:  normal S1, S2; RRR; no murmur Lungs:  clear to auscultation bilaterally, no wheezing, rhonchi or rales Abd: soft, nontender, no hepatomegaly Ext: no edema Skin: warm and dry Neuro:  CNs 2-12 intact, no focal abnormalities noted  EKG:     09/23/2013 - NSR with nonspecific ST abnormality in lateral precordial leads  ASSESSMENT AND PLAN:  1. Acute ischemic CVA with ? PAF on initial tele monitor in ER but no documentation by  EKG.  All EKGs in hospital were NSR and no PAF noted on tele during admission. - Lifewatch monitor to assess for silent PAF 2. DM 3. Dyslipidemia  Followup with me in 5 weeks Signed, Fransico Him, MD 11/02/2013 10:53 AM

## 2013-11-02 NOTE — Patient Instructions (Signed)
Your physician recommends that you continue on your current medications as directed. Please refer to the Current Medication list given to you today.  Your physician recommends that you schedule a follow-up appointment in: 5 weeks  Your physician has recommended that you wear an event monitor. Event monitors are medical devices that record the heart's electrical activity. Doctors most often [place] these monitors to diagnose arrhythmias. Arrhythmias are problems with the speed or rhythm of the heartbeat. The monitor is a small, portable device. You can wear one while you do your normal daily activities. This is usually used to diagnose what is causing palpitations/syncope (passing out). LIFEWATCH MONITOR

## 2013-11-02 NOTE — Progress Notes (Signed)
Patient ID: Brad Taylor, male   DOB: 08/23/1950, 63 y.o.   MRN: [id number] E-Cardio verite 30 day cardiac event monitor applied to patient.

## 2013-11-02 NOTE — Addendum Note (Signed)
Addended by: Alvina Filbert B on: 11/02/2013 03:01 PM   Modules accepted: Orders

## 2013-11-03 ENCOUNTER — Encounter: Payer: Self-pay | Admitting: *Deleted

## 2013-11-03 NOTE — Telephone Encounter (Signed)
This encounter was created in error - please disregard.

## 2013-11-04 ENCOUNTER — Other Ambulatory Visit (HOSPITAL_COMMUNITY): Payer: Self-pay | Admitting: Family Medicine

## 2013-11-04 ENCOUNTER — Telehealth: Payer: Self-pay

## 2013-11-04 NOTE — Telephone Encounter (Signed)
Will do then

## 2013-11-04 NOTE — Telephone Encounter (Signed)
Brad Taylor an occupational therapist with [facility] is requesting patient go to out patient occupational therapy.  He has an appointment at [facility] 11/09/2013.

## 2013-11-08 NOTE — Telephone Encounter (Signed)
Will fwd to MD.  Dorismar Chay L, CMA  

## 2013-11-09 ENCOUNTER — Other Ambulatory Visit (HOSPITAL_COMMUNITY): Payer: Self-pay | Admitting: Family Medicine

## 2013-11-09 ENCOUNTER — Encounter: Payer: Self-pay | Admitting: Physical Medicine & Rehabilitation

## 2013-11-09 ENCOUNTER — Encounter: Payer: Medicare HMO | Attending: Physical Medicine & Rehabilitation

## 2013-11-09 ENCOUNTER — Ambulatory Visit (HOSPITAL_BASED_OUTPATIENT_CLINIC_OR_DEPARTMENT_OTHER): Payer: Commercial Managed Care - HMO | Admitting: Physical Medicine & Rehabilitation

## 2013-11-09 DIAGNOSIS — G8320 Monoplegia of upper limb affecting unspecified side: Secondary | ICD-10-CM

## 2013-11-09 DIAGNOSIS — E785 Hyperlipidemia, unspecified: Secondary | ICD-10-CM | POA: Insufficient documentation

## 2013-11-09 DIAGNOSIS — E039 Hypothyroidism, unspecified: Secondary | ICD-10-CM | POA: Insufficient documentation

## 2013-11-09 DIAGNOSIS — I69320 Aphasia following cerebral infarction: Secondary | ICD-10-CM | POA: Insufficient documentation

## 2013-11-09 DIAGNOSIS — I69920 Aphasia following unspecified cerebrovascular disease: Secondary | ICD-10-CM

## 2013-11-09 DIAGNOSIS — E119 Type 2 diabetes mellitus without complications: Secondary | ICD-10-CM | POA: Insufficient documentation

## 2013-11-09 DIAGNOSIS — J4489 Other specified chronic obstructive pulmonary disease: Secondary | ICD-10-CM | POA: Insufficient documentation

## 2013-11-09 DIAGNOSIS — F4310 Post-traumatic stress disorder, unspecified: Secondary | ICD-10-CM | POA: Insufficient documentation

## 2013-11-09 DIAGNOSIS — K219 Gastro-esophageal reflux disease without esophagitis: Secondary | ICD-10-CM | POA: Insufficient documentation

## 2013-11-09 DIAGNOSIS — Z87891 Personal history of nicotine dependence: Secondary | ICD-10-CM | POA: Insufficient documentation

## 2013-11-09 DIAGNOSIS — J449 Chronic obstructive pulmonary disease, unspecified: Secondary | ICD-10-CM | POA: Insufficient documentation

## 2013-11-09 NOTE — Patient Instructions (Addendum)
Refills of your medication will be from Dr Dennard Schaumann  No driving, we will check you for this next visit  Recommend outpatient OT as well his speech therapy. They will call you to schedule an appointment

## 2013-11-09 NOTE — Progress Notes (Signed)
Subjective:    Patient ID: Brad Taylor, male    DOB: 1950-10-12, 63 y.o.   MRN: [id number]  HPI This is a 63 year old male multimedical  admitted September 15, 2013, with right-sided weakness, slurred speech.  MRI of the brain showed acute ischemic infarct to posterior limb of the  left internal capsule as well as additional subcentimeter ischemic deep  white matter infarcts. MRA of the head with no stenosis or occlusion.  Echocardiogram was pending. Carotid Dopplers with no ICA stenosis. The  patient did not receive tPA. Neurology Service was consulted,  maintained on Plavix therapy. Subcutaneous Lovenox for DVT prophylaxis.  Follow up wound care nurse for chronic left lower extremity ulcer.   Patient has been working with home health occupational therapy. Physical therapy stopped coming out. Patient did not want speech therapy. Patient has made good progress thus far with occupational therapy but still has fine motor deficits as well as weakness noted by OT in a note from last week  No falls at home Patient dressing and bathing himself with the exception of buttoning his pants is sometimes difficult. The patient wants to return to driving. Has driven his riding lawn mower  Pain Inventory Average Pain 6 Pain Right Now 6 My pain is n/  In the last 24 hours, has pain interfered with the following? General activity 4 Relation with others 4 Enjoyment of life 4 What TIME of day is your pain at its worst? n/a Sleep (in general) Good  Pain is worse with: bending and some activites Pain improves with: rest and medication Relief from Meds: n/a  Mobility walk without assistance how many minutes can you walk? 20 ability to climb steps?  yes do you drive?  no Do you have any goals in this area?  yes  Function disabled: date disabled . retired I need assistance with the following:  bathing and toileting Do you have any goals in this area?  yes  Neuro/Psych trouble  walking  Prior Studies Any changes since last visit?  no  Physicians involved in your care Any changes since last visit?  no   History reviewed. No pertinent family history. History   Social History  . Marital Status: Married    Spouse Name: N/A    Number of Children: N/A  . Years of Education: N/A   Social History Main Topics  . Smoking status: Former Smoker -- 1.00 packs/day    Types: Cigarettes  . Smokeless tobacco: Former Systems developer    Quit date: 05/04/2013  . Alcohol Use: No     Comment: Former heavy drinker, been sober for 10 years  . Drug Use: No  . Sexual Activity: None   Other Topics Concern  . None   Social History Narrative   Retired Dealer.   Past Surgical History  Procedure Laterality Date  . Cholecystectomy    . Cataract extraction    . Knee surgery     Past Medical History  Diagnosis Date  . COPD (chronic obstructive pulmonary disease)   . Diabetes mellitus without complication   . Hypothyroidism   . GERD (gastroesophageal reflux disease)   . Ulcer   . Post traumatic stress disorder   . Depression   . Hyperlipidemia    There were no vitals taken for this visit.  Opioid Risk Score:   Fall Risk Score: High Fall Risk (>13 points) (patient educated handout declined)   Review of Systems  All other systems reviewed and are negative.  Objective:   Physical Exam  Nursing note and vitals reviewed. Constitutional: He appears well-developed and well-nourished.  HENT:  Head: Normocephalic and atraumatic.  Right Ear: External ear normal.  Left Ear: External ear normal.  Eyes: Conjunctivae are normal. Pupils are equal, round, and reactive to light.  Neck: Normal range of motion.  Neurological: He is alert. No cranial nerve deficit or sensory deficit. Coordination and gait abnormal.  Aphasia Gait has widened base of support otherwise no evidence of toe drag or knee instability Motor strength is 4 minus right deltoid, bicep, tricep,  grip 5/5 in bilateral hip flexors, knee extensors, ankle dorsiflexors and plantar flexors Decreased finger to thumb opposition right hand   Psychiatric: He has a normal mood and affect.          Assessment & Plan:  1. 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

## 2013-11-10 ENCOUNTER — Other Ambulatory Visit (HOSPITAL_COMMUNITY): Payer: Self-pay | Admitting: Family Medicine

## 2013-11-18 ENCOUNTER — Ambulatory Visit: Payer: Medicare HMO | Attending: Physical Medicine & Rehabilitation | Admitting: Occupational Therapy

## 2013-11-18 DIAGNOSIS — R279 Unspecified lack of coordination: Secondary | ICD-10-CM | POA: Insufficient documentation

## 2013-11-18 DIAGNOSIS — M6281 Muscle weakness (generalized): Secondary | ICD-10-CM | POA: Insufficient documentation

## 2013-11-18 DIAGNOSIS — IMO0001 Reserved for inherently not codable concepts without codable children: Secondary | ICD-10-CM | POA: Insufficient documentation

## 2013-11-19 ENCOUNTER — Ambulatory Visit: Payer: Medicare HMO | Admitting: Occupational Therapy

## 2013-11-24 ENCOUNTER — Ambulatory Visit: Payer: Medicare HMO | Admitting: Occupational Therapy

## 2013-11-30 ENCOUNTER — Ambulatory Visit: Payer: Medicare HMO | Admitting: Occupational Therapy

## 2013-12-02 ENCOUNTER — Ambulatory Visit: Payer: Medicare HMO | Admitting: Speech Pathology

## 2013-12-02 ENCOUNTER — Ambulatory Visit: Payer: Medicare HMO | Admitting: Rehabilitative and Restorative Service Providers"

## 2013-12-02 ENCOUNTER — Ambulatory Visit: Payer: Medicare HMO | Admitting: Occupational Therapy

## 2013-12-03 ENCOUNTER — Encounter: Payer: Medicare HMO | Admitting: Occupational Therapy

## 2013-12-03 DIAGNOSIS — I714 Abdominal aortic aneurysm, without rupture, unspecified: Secondary | ICD-10-CM

## 2013-12-03 HISTORY — DX: Abdominal aortic aneurysm, without rupture: I71.4

## 2013-12-03 HISTORY — DX: Abdominal aortic aneurysm, without rupture, unspecified: I71.40

## 2013-12-06 ENCOUNTER — Encounter: Payer: Self-pay | Admitting: Family Medicine

## 2013-12-06 ENCOUNTER — Ambulatory Visit (INDEPENDENT_AMBULATORY_CARE_PROVIDER_SITE_OTHER): Payer: Commercial Managed Care - HMO | Admitting: Family Medicine

## 2013-12-06 VITALS — BP 120/88 | HR 92 | Temp 97.5°F | Resp 20 | Ht 70.75 in | Wt 256.0 lb

## 2013-12-06 DIAGNOSIS — K5732 Diverticulitis of large intestine without perforation or abscess without bleeding: Secondary | ICD-10-CM

## 2013-12-06 MED ORDER — CIPROFLOXACIN HCL 500 MG PO TABS: 500.0000 mg | ORAL_TABLET | Freq: Two times a day (BID) | ORAL | Status: DC

## 2013-12-06 MED ORDER — METRONIDAZOLE 500 MG PO TABS: 500.0000 mg | ORAL_TABLET | Freq: Two times a day (BID) | ORAL | Status: DC

## 2013-12-06 NOTE — Progress Notes (Signed)
Subjective:    Patient ID: Brad Taylor, male    DOB: 26-Aug-1950, 63 y.o.   MRN: [id number]  HPI Patient presents with 3 days of left lower quadrant abdominal pain. He reports subjective fevers. He denies any hematochezia or melena. He denies any diarrhea. He denies any constipation. He does report nausea. Symptoms are getting worse. He is now at tender to palpation in the left lower quadrant. He has no guarding and no rebound. Past Medical History  Diagnosis Date  . COPD (chronic obstructive pulmonary disease)   . Diabetes mellitus without complication   . Hypothyroidism   . GERD (gastroesophageal reflux disease)   . Ulcer   . Post traumatic stress disorder   . Depression   . Hyperlipidemia    Current Outpatient Prescriptions on File Prior to Visit  Medication Sig Dispense Refill  . atorvastatin (LIPITOR) 10 MG tablet Take 10 mg by mouth daily. Takes 5 mg      . buPROPion (WELLBUTRIN SR) 150 MG 12 hr tablet Take 1 tablet (150 mg total) by mouth daily.  30 tablet  1  . busPIRone (BUSPAR) 10 MG tablet Take 0.5 tablets (5 mg total) by mouth 2 (two) times daily as needed (for anxiety).  60 tablet  0  . clopidogrel (PLAVIX) 75 MG tablet Take 1 tablet (75 mg total) by mouth daily with breakfast.  30 tablet  1  . clopidogrel (PLAVIX) 75 MG tablet TAKE 1 TABLET (75 MG TOTAL) BY MOUTH DAILY WITH BREAKFAST.  30 tablet  5  . insulin glargine (LANTUS) 100 UNIT/ML injection 45 units every a.m. & qhs      . levothyroxine (SYNTHROID, LEVOTHROID) 125 MCG tablet Take 1 tablet (125 mcg total) by mouth daily before breakfast.  30 tablet  1  . lisinopril (PRINIVIL,ZESTRIL) 10 MG tablet Take 1 tablet (10 mg total) by mouth daily.  30 tablet  1  . RABEprazole (ACIPHEX) 20 MG tablet Take 2 tablets (40 mg total) by mouth daily before breakfast.  30 tablet  1  . traZODone (DESYREL) 100 MG tablet Take 3 tablets (300 mg total) by mouth at bedtime.  30 tablet  0  . venlafaxine XR (EFFEXOR-XR) 150 MG 24 hr  capsule Take 1 capsule (150 mg total) by mouth daily with breakfast.  30 capsule  1   No current facility-administered medications on file prior to visit.   No Known Allergies History   Social History  . Marital Status: Married    Spouse Name: N/A    Number of Children: N/A  . Years of Education: N/A   Occupational History  . Not on file.   Social History Main Topics  . Smoking status: Former Smoker -- 1.00 packs/day    Types: Cigarettes  . Smokeless tobacco: Former Systems developer    Quit date: 05/04/2013  . Alcohol Use: No     Comment: Former heavy drinker, been sober for 10 years  . Drug Use: No  . Sexual Activity: Not on file   Other Topics Concern  . Not on file   Social History Narrative   Retired Dealer.      Review of Systems  All other systems reviewed and are negative.      Objective:   Physical Exam  Vitals reviewed. Cardiovascular: Normal rate and regular rhythm.   No murmur heard. Pulmonary/Chest: Effort normal and breath sounds normal. No respiratory distress. He has no wheezes. He has no rales.  Abdominal: Soft. Bowel sounds are normal. He  exhibits no distension. There is tenderness. There is no rebound and no guarding.   patient is tender to palpation in the left LQ        Assessment & Plan:  1. Diverticulitis of colon (without mention of hemorrhage) Begin Cipro 500 mg by mouth twice a day and Flagyl 500 mg by mouth twice a day for 14 days. If symptoms worsen his emergency room immediately for CT scan. Patient developed right lower quadrant abdominal pain he is to go to the emergency room for evaluation for appendicitis. - ciprofloxacin (CIPRO) 500 MG tablet; Take 1 tablet (500 mg total) by mouth 2 (two) times daily.  Dispense: 28 tablet; Refill: 0 - metroNIDAZOLE (FLAGYL) 500 MG tablet; Take 1 tablet (500 mg total) by mouth 2 (two) times daily.  Dispense: 28 tablet; Refill: 0

## 2013-12-07 ENCOUNTER — Ambulatory Visit: Payer: Medicare HMO | Admitting: Rehabilitative and Restorative Service Providers"

## 2013-12-07 ENCOUNTER — Encounter: Payer: Medicare HMO | Admitting: Occupational Therapy

## 2013-12-07 ENCOUNTER — Other Ambulatory Visit: Payer: Self-pay | Admitting: Family Medicine

## 2013-12-07 DIAGNOSIS — K5732 Diverticulitis of large intestine without perforation or abscess without bleeding: Secondary | ICD-10-CM

## 2013-12-07 MED ORDER — CIPROFLOXACIN HCL 500 MG PO TABS: 500.0000 mg | ORAL_TABLET | Freq: Two times a day (BID) | ORAL | Status: DC

## 2013-12-08 ENCOUNTER — Telehealth: Payer: Self-pay | Admitting: Cardiology

## 2013-12-08 NOTE — Telephone Encounter (Signed)
Please let patient know that heart monitor showed NSR with occasional PVC's which are benign.  Please get a 24 Holter monitor to assess PVC load

## 2013-12-08 NOTE — Telephone Encounter (Signed)
There is no evidence of silent atrial fibrillation.  LVF was normal by echo.

## 2013-12-09 ENCOUNTER — Encounter: Payer: Medicare HMO | Admitting: Occupational Therapy

## 2013-12-09 ENCOUNTER — Other Ambulatory Visit: Payer: Self-pay | Admitting: Family Medicine

## 2013-12-09 ENCOUNTER — Ambulatory Visit: Payer: Medicare HMO | Admitting: Physical Therapy

## 2013-12-09 ENCOUNTER — Telehealth: Payer: Self-pay | Admitting: *Deleted

## 2013-12-09 DIAGNOSIS — K5732 Diverticulitis of large intestine without perforation or abscess without bleeding: Secondary | ICD-10-CM

## 2013-12-09 MED ORDER — PROMETHAZINE HCL 25 MG PO TABS: 25.0000 mg | ORAL_TABLET | Freq: Four times a day (QID) | ORAL | Status: DC | PRN

## 2013-12-09 NOTE — Telephone Encounter (Signed)
Brad Taylor also asked that I forward to [facility]

## 2013-12-09 NOTE — Telephone Encounter (Signed)
Call placed to patient and patient made aware.   Orders placed.  

## 2013-12-09 NOTE — Telephone Encounter (Signed)
Message copied by Sheral Flow on Thu Dec 09, 2013  8:45 AM ------      Message from: Devoria Glassing      Created: Thu Dec 09, 2013  8:25 AM       Patients wife is calling wanting to speak with dr pickard regarding his condition and he will not get out of bed please call back at [phone] ------

## 2013-12-09 NOTE — Telephone Encounter (Signed)
Call returned to patient wife Brad Taylor.   Reports that patient was diagnosed with diverticulitis and ABTx was started on 12/07/2013.  States that patient is still not eating and reports increased nausea. States that patient is weak and is not getting out of bed except to go to the bathroom.   Patient wife requested MD to advise on nausea.

## 2013-12-09 NOTE — Telephone Encounter (Signed)
Phenergan 25 mg poq6hrs prn nausea, Needs CT of abd and pelvis asap.

## 2013-12-09 NOTE — Telephone Encounter (Signed)
Pt is ok with this only if it is PA thru insurance because they are going thru issues already with [facility] paying for his event monitor. TO Shelly how do we make sure this would be PA thru [facility]?

## 2013-12-10 ENCOUNTER — Telehealth: Payer: Self-pay | Admitting: Family Medicine

## 2013-12-10 ENCOUNTER — Ambulatory Visit
Admission: RE | Admit: 2013-12-10 | Discharge: 2013-12-10 | Disposition: A | Discharge status: Home / Self Care | Payer: Commercial Managed Care - HMO | Source: Ambulatory Visit | Attending: Family Medicine | Admitting: Family Medicine

## 2013-12-10 DIAGNOSIS — K5732 Diverticulitis of large intestine without perforation or abscess without bleeding: Secondary | ICD-10-CM

## 2013-12-10 MED ORDER — IOHEXOL 300 MG/ML  SOLN
125.0000 mL | Freq: Once | INTRAMUSCULAR | Status: AC | PRN
  Administered 2013-12-10: 125 mL via INTRAVENOUS

## 2013-12-10 NOTE — Telephone Encounter (Signed)
Per Dr Dennard Schaumann. CT scan show only mild pancreatitis.  He neds to stay on and complete antibiotics, remain on clear liquid diet.  If worsens over the week end, go to ER.  Return to clinic Monday to see Dr Dennard Schaumann

## 2013-12-10 NOTE — Telephone Encounter (Signed)
Spoke to wife.  Given provider instructions.  Appt made for Monday

## 2013-12-13 ENCOUNTER — Ambulatory Visit (INDEPENDENT_AMBULATORY_CARE_PROVIDER_SITE_OTHER): Payer: Commercial Managed Care - HMO | Admitting: Family Medicine

## 2013-12-13 ENCOUNTER — Encounter: Payer: Self-pay | Admitting: Family Medicine

## 2013-12-13 ENCOUNTER — Other Ambulatory Visit: Payer: Self-pay | Admitting: Family Medicine

## 2013-12-13 ENCOUNTER — Encounter: Payer: Medicare HMO | Admitting: Occupational Therapy

## 2013-12-13 VITALS — BP 100/68 | HR 88 | Temp 97.7°F | Resp 18 | Ht 70.75 in | Wt 256.0 lb

## 2013-12-13 DIAGNOSIS — K859 Acute pancreatitis without necrosis or infection, unspecified: Secondary | ICD-10-CM

## 2013-12-13 DIAGNOSIS — K611 Rectal abscess: Secondary | ICD-10-CM

## 2013-12-13 DIAGNOSIS — K612 Anorectal abscess: Secondary | ICD-10-CM

## 2013-12-13 LAB — COMPLETE METABOLIC PANEL WITH GFR
ALBUMIN: 4.1 g/dL (ref 3.5–5.2)
ALT: 34 U/L (ref 0–53)
AST: 18 U/L (ref 0–37)
Alkaline Phosphatase: 227 U/L — ABNORMAL HIGH (ref 39–117)
BILIRUBIN TOTAL: 0.5 mg/dL (ref 0.2–1.2)
BUN: 15 mg/dL (ref 6–23)
CO2: 27 meq/L (ref 19–32)
Calcium: 9.3 mg/dL (ref 8.4–10.5)
Chloride: 92 mEq/L — ABNORMAL LOW (ref 96–112)
Creat: 0.85 mg/dL (ref 0.50–1.35)
GFR, Est Non African American: >89 mL/min
GLUCOSE: 186 mg/dL — AB (ref 70–99)
Potassium: 4.9 mEq/L (ref 3.5–5.3)
SODIUM: 128 meq/L — AB (ref 135–145)
TOTAL PROTEIN: 6.7 g/dL (ref 6.0–8.3)

## 2013-12-13 LAB — CBC WITH DIFFERENTIAL/PLATELET
BASOS PCT: 0 % (ref 0–1)
Basophils Absolute: 0 10*3/uL (ref 0.0–0.1)
Eosinophils Absolute: 0.3 10*3/uL (ref 0.0–0.7)
Eosinophils Relative: 3 % (ref 0–5)
HEMATOCRIT: 43.6 % (ref 39.0–52.0)
HEMOGLOBIN: 15.4 g/dL (ref 13.0–17.0)
LYMPHS ABS: 3.8 10*3/uL (ref 0.7–4.0)
Lymphocytes Relative: 35 % (ref 12–46)
MCH: 31.2 pg (ref 26.0–34.0)
MCHC: 35.3 g/dL (ref 30.0–36.0)
MCV: 88.3 fL (ref 78.0–100.0)
MONO ABS: 0.9 10*3/uL (ref 0.1–1.0)
MONOS PCT: 8 % (ref 3–12)
NEUTROS PCT: 54 % (ref 43–77)
Neutro Abs: 5.8 10*3/uL (ref 1.7–7.7)
Platelets: 308 10*3/uL (ref 150–400)
RBC: 4.94 MIL/uL (ref 4.22–5.81)
RDW: 14.3 % (ref 11.5–15.5)
WBC: 10.8 10*3/uL — ABNORMAL HIGH (ref 4.0–10.5)

## 2013-12-13 LAB — LIPASE: Lipase: 36 U/L (ref 0–75)

## 2013-12-13 NOTE — Progress Notes (Signed)
Subjective:    Patient ID: Brad Taylor, male    DOB: 1950-11-29, 63 y.o.   MRN: [id number]  HPI 12/06/13 Patient presents with 3 days of left lower quadrant abdominal pain. He reports subjective fevers. He denies any hematochezia or melena. He denies any diarrhea. He denies any constipation. He does report nausea. Symptoms are getting worse. He is now at tender to palpation in the left lower quadrant. He has no guarding and no rebound.  At that time, my plan was: 1. Diverticulitis of colon (without mention of hemorrhage) Begin Cipro 500 mg by mouth twice a day and Flagyl 500 mg by mouth twice a day for 14 days. If symptoms worsen his emergency room immediately for CT scan. Patient developed right lower quadrant abdominal pain he is to go to the emergency room for evaluation for appendicitis. - ciprofloxacin (CIPRO) 500 MG tablet; Take 1 tablet (500 mg total) by mouth 2 (two) times daily.  Dispense: 28 tablet; Refill: 0 - metroNIDAZOLE (FLAGYL) 500 MG tablet; Take 1 tablet (500 mg total) by mouth 2 (two) times daily.  Dispense: 28 tablet; Refill: 0  12/13/13 Patient's wife called Thursday stating that he is extremely nauseated, continued to have abdominal pain, and was getting weaker. At that point obtained a CT scan of the abdomen and pelvis which showed no acute finding to explain his symptoms but did show possible signs of mild pancreatitis.  1. No definite acute abnormality identified in the abdomen or  pelvis. There is question of very slight stranding surrounding the  pancreas, although images are degraded by motion. Mild pancreatitis  is not excluded, and correlation with serum lipase may be helpful if  there is any clinical suspicion for pancreatitis.  2. Advanced atherosclerotic disease with infrarenal abdominal aortic  aneurysm and mild aneurysmal dilatation of the common iliac arteries  as above.  At that point we instructed the patient to consume a clear liquid diet and to rest  his intestinal tract until his abdominal pain improved. We provided the patient with Phenergan to help with his nausea. He is here today for recheck. His weight is stable. The pain and nausea have improved dramatically. Unfortunately he felt extremely weak and tired. However the patient has not had hardly any solid food for almost one week.  He also complains of chronic pain around his rectum. He has a history of recurrent perirectal abscess on the inner left gluteal fold. We have I+D that several times over the last several months that consistently returns. There is no evidence of an acute infection in that area. He has chronic inflammatory changes in that area and scar tissue. However the patient is very tender to palpation in that area. Past Medical History  Diagnosis Date  . COPD (chronic obstructive pulmonary disease)   . Diabetes mellitus without complication   . Hypothyroidism   . GERD (gastroesophageal reflux disease)   . Ulcer   . Post traumatic stress disorder   . Depression   . Hyperlipidemia    Current Outpatient Prescriptions on File Prior to Visit  Medication Sig Dispense Refill  . atorvastatin (LIPITOR) 10 MG tablet Take 10 mg by mouth daily. Takes 5 mg      . buPROPion (WELLBUTRIN SR) 150 MG 12 hr tablet Take 1 tablet (150 mg total) by mouth daily.  30 tablet  1  . busPIRone (BUSPAR) 10 MG tablet Take 0.5 tablets (5 mg total) by mouth 2 (two) times daily as needed (for anxiety).  60 tablet  0  . ciprofloxacin (CIPRO) 500 MG tablet Take 1 tablet (500 mg total) by mouth 2 (two) times daily.  28 tablet  0  . clopidogrel (PLAVIX) 75 MG tablet Take 1 tablet (75 mg total) by mouth daily with breakfast.  30 tablet  1  . clopidogrel (PLAVIX) 75 MG tablet TAKE 1 TABLET (75 MG TOTAL) BY MOUTH DAILY WITH BREAKFAST.  30 tablet  5  . insulin glargine (LANTUS) 100 UNIT/ML injection 45 units every a.m. & qhs      . levothyroxine (SYNTHROID, LEVOTHROID) 125 MCG tablet Take 1 tablet (125 mcg  total) by mouth daily before breakfast.  30 tablet  1  . lisinopril (PRINIVIL,ZESTRIL) 10 MG tablet Take 1 tablet (10 mg total) by mouth daily.  30 tablet  1  . metroNIDAZOLE (FLAGYL) 500 MG tablet Take 1 tablet (500 mg total) by mouth 2 (two) times daily.  28 tablet  0  . promethazine (PHENERGAN) 25 MG tablet Take 1 tablet (25 mg total) by mouth every 6 (six) hours as needed for nausea or vomiting.  20 tablet  0  . RABEprazole (ACIPHEX) 20 MG tablet Take 2 tablets (40 mg total) by mouth daily before breakfast.  30 tablet  1  . traZODone (DESYREL) 100 MG tablet Take 3 tablets (300 mg total) by mouth at bedtime.  30 tablet  0  . venlafaxine XR (EFFEXOR-XR) 150 MG 24 hr capsule Take 1 capsule (150 mg total) by mouth daily with breakfast.  30 capsule  1   No current facility-administered medications on file prior to visit.   No Known Allergies History   Social History  . Marital Status: Married    Spouse Name: N/A    Number of Children: N/A  . Years of Education: N/A   Occupational History  . Not on file.   Social History Main Topics  . Smoking status: Former Smoker -- 1.00 packs/day    Types: Cigarettes  . Smokeless tobacco: Former Systems developer    Quit date: 05/04/2013  . Alcohol Use: No     Comment: Former heavy drinker, been sober for 10 years  . Drug Use: No  . Sexual Activity: Not on file   Other Topics Concern  . Not on file   Social History Narrative   Retired Dealer.      Review of Systems  All other systems reviewed and are negative.      Objective:   Physical Exam  Vitals reviewed. Cardiovascular: Normal rate and regular rhythm.   No murmur heard. Pulmonary/Chest: Effort normal and breath sounds normal. No respiratory distress. He has no wheezes. He has no rales.  Abdominal: Soft. Bowel sounds are normal. He exhibits no distension. There is tenderness. There is no rebound and no guarding.   patient is tender to palpation in the left LQ        Assessment &  Plan:  1. Pancreatitis, acute Gradually seems to be improving. I recommended advancing his diet as tolerated to bland food and gradually increase as his stomach can tolerate.  I believe his weakness is due to the illness and his lack of intake over the last few days I will check a CBC and a CMP and lipase. If his labs are relatively normal, I anticipate his weakness will improve as he starts eating more and as he recovers.  We need to follow up his abdominal aortic aneurysm in one year - CBC with Differential - COMPLETE METABOLIC PANEL WITH GFR -  Lipase  2. Perirectal abscess At this point I am concerned there may be some type of perirectal fistula that allows this to return.  Consult general surgery. This is nonurgent and I recommended no intervention until he completely recovers from his pancreatitis first. - Ambulatory referral to General Surgery

## 2013-12-14 ENCOUNTER — Encounter: Payer: Self-pay | Admitting: Physical Medicine & Rehabilitation

## 2013-12-14 ENCOUNTER — Encounter: Payer: Medicare HMO | Attending: Physical Medicine & Rehabilitation

## 2013-12-14 ENCOUNTER — Ambulatory Visit: Payer: Medicare HMO | Attending: Physical Medicine & Rehabilitation | Admitting: Occupational Therapy

## 2013-12-14 ENCOUNTER — Ambulatory Visit (HOSPITAL_BASED_OUTPATIENT_CLINIC_OR_DEPARTMENT_OTHER): Payer: Commercial Managed Care - HMO | Admitting: Physical Medicine & Rehabilitation

## 2013-12-14 VITALS — BP 139/80 | HR 83 | Resp 14 | Ht 72.0 in | Wt 256.0 lb

## 2013-12-14 DIAGNOSIS — R279 Unspecified lack of coordination: Secondary | ICD-10-CM | POA: Diagnosis not present

## 2013-12-14 DIAGNOSIS — M6281 Muscle weakness (generalized): Secondary | ICD-10-CM | POA: Diagnosis not present

## 2013-12-14 DIAGNOSIS — J4489 Other specified chronic obstructive pulmonary disease: Secondary | ICD-10-CM | POA: Insufficient documentation

## 2013-12-14 DIAGNOSIS — J449 Chronic obstructive pulmonary disease, unspecified: Secondary | ICD-10-CM | POA: Insufficient documentation

## 2013-12-14 DIAGNOSIS — E785 Hyperlipidemia, unspecified: Secondary | ICD-10-CM | POA: Insufficient documentation

## 2013-12-14 DIAGNOSIS — K219 Gastro-esophageal reflux disease without esophagitis: Secondary | ICD-10-CM | POA: Insufficient documentation

## 2013-12-14 DIAGNOSIS — G8320 Monoplegia of upper limb affecting unspecified side: Secondary | ICD-10-CM | POA: Insufficient documentation

## 2013-12-14 DIAGNOSIS — E039 Hypothyroidism, unspecified: Secondary | ICD-10-CM | POA: Insufficient documentation

## 2013-12-14 DIAGNOSIS — Z87891 Personal history of nicotine dependence: Secondary | ICD-10-CM | POA: Insufficient documentation

## 2013-12-14 DIAGNOSIS — IMO0001 Reserved for inherently not codable concepts without codable children: Secondary | ICD-10-CM | POA: Diagnosis present

## 2013-12-14 DIAGNOSIS — E119 Type 2 diabetes mellitus without complications: Secondary | ICD-10-CM | POA: Insufficient documentation

## 2013-12-14 DIAGNOSIS — I69920 Aphasia following unspecified cerebrovascular disease: Secondary | ICD-10-CM

## 2013-12-14 DIAGNOSIS — I69320 Aphasia following cerebral infarction: Secondary | ICD-10-CM

## 2013-12-14 DIAGNOSIS — F4310 Post-traumatic stress disorder, unspecified: Secondary | ICD-10-CM | POA: Insufficient documentation

## 2013-12-14 NOTE — Progress Notes (Signed)
Subjective:    Patient ID: Brad Taylor, male    DOB: 13-Oct-1950, 63 y.o.   MRN: [id number] This is a 63 year old male multimedical  admitted September 15, 2013, with right-sided weakness, slurred speech.  MRI of the brain showed acute ischemic infarct to posterior limb of the  left internal capsule as well as additional subcentimeter ischemic deep  white matter infarcts. MRA of the head with no stenosis or occlusion.  Echocardiogram was pending. Carotid Dopplers with no ICA stenosis. The  patient did not receive tPA. Neurology Service was consulted,  maintained on Plavix therapy. Subcutaneous Lovenox for DVT prophylaxis  HPI No falls at home Working with outpatient PT and OT and speech therapy twice a week Making good progress. Feeling like himself again Has seen family physician. Has a dirt road 1 mile long private drives his truck to mailbox No problems Lives with wife  Pain Inventory Average Pain 2 Pain Right Now 2 My pain is dull  In the last 24 hours, has pain interfered with the following? General activity 2 Relation with others 2 Enjoyment of life 2 What TIME of day is your pain at its worst? evening Sleep (in general) Fair  Pain is worse with: unsure Pain improves with: rest Relief from Meds: 10  Mobility walk without assistance how many minutes can you walk? 15-20 ability to climb steps?  yes do you drive?  no  Function retired  Neuro/Psych No problems in this area  Prior Studies Any changes since last visit?  no  Physicians involved in your care Any changes since last visit?  no   History reviewed. No pertinent family history. History   Social History  . Marital Status: Married    Spouse Name: N/A    Number of Children: N/A  . Years of Education: N/A   Social History Main Topics  . Smoking status: Former Smoker -- 1.00 packs/day    Types: Cigarettes  . Smokeless tobacco: Former Systems developer    Quit date: 05/04/2013  . Alcohol Use: No   Comment: Former heavy drinker, been sober for 10 years  . Drug Use: No  . Sexual Activity: None   Other Topics Concern  . None   Social History Narrative   Retired Dealer.   Past Surgical History  Procedure Laterality Date  . Cholecystectomy    . Cataract extraction    . Knee surgery     Past Medical History  Diagnosis Date  . COPD (chronic obstructive pulmonary disease)   . Diabetes mellitus without complication   . Hypothyroidism   . GERD (gastroesophageal reflux disease)   . Ulcer   . Post traumatic stress disorder   . Depression   . Hyperlipidemia   . AAA (abdominal aortic aneurysm) 5/15    3.5x3.5 cm   BP 139/80  Pulse 83  Resp 14  Ht 6' (1.829 m)  Wt 256 lb (116.121 kg)  BMI 34.71 kg/m2  SpO2 97%  Opioid Risk Score:   Fall Risk Score: Moderate Fall Risk (6-13 points) (educated and handout given on fall prevention in the home)   Review of Systems  All other systems reviewed and are negative.      Objective:   Physical Exam  5/5 BUE aqnd BLE Mild finger to thumb opposition deficit Sensation equal bilateral Visual fields are intact Extraocular muscles are intact no evidence of nystagmus Mood and affect are appropriate Ambulates without assistive device no toe drag or knee instability. Speech with decreased word finding  abilities but has good comprehension     Assessment & Plan:  1.  Left MCA infarct with aphasia and mild R fine motor deficits, excellent progress functionally independent, with mainly working on fine motor as well as speech deficits  Graduated return to driving instructions were provided. It is recommended that the patient first drives with another licensed driver in an empty parking lot. If the patient does well with this, and they can drive on a quiet street with the licensed driver. If the patient does well with this they can drive on a busy street with a licensed driver. If the patient does well with this, the next time out they can  go by himself. For the first month after resuming driving, I recommend no nighttime or Interstate driving.

## 2013-12-14 NOTE — Patient Instructions (Signed)

## 2013-12-15 ENCOUNTER — Ambulatory Visit: Payer: Medicare HMO

## 2013-12-15 ENCOUNTER — Ambulatory Visit: Payer: Medicare HMO | Admitting: Physical Therapy

## 2013-12-15 DIAGNOSIS — IMO0001 Reserved for inherently not codable concepts without codable children: Secondary | ICD-10-CM | POA: Diagnosis not present

## 2013-12-15 LAB — GAMMA GT: GGT: 388 U/L — ABNORMAL HIGH (ref 7–51)

## 2013-12-17 ENCOUNTER — Telehealth: Payer: Self-pay | Admitting: *Deleted

## 2013-12-17 ENCOUNTER — Ambulatory Visit: Payer: Medicare HMO | Admitting: Occupational Therapy

## 2013-12-17 DIAGNOSIS — IMO0001 Reserved for inherently not codable concepts without codable children: Secondary | ICD-10-CM | POA: Diagnosis not present

## 2013-12-17 NOTE — Telephone Encounter (Signed)
Faxed [employer] care mgmt for authorition for [place] authorization

## 2013-12-20 NOTE — Telephone Encounter (Signed)
Received authorization from [facility] authorization number is [id number] to Dr. Michael Boston MD at [facility] [address]

## 2013-12-21 ENCOUNTER — Ambulatory Visit: Payer: Medicare HMO | Admitting: Occupational Therapy

## 2013-12-21 ENCOUNTER — Ambulatory Visit: Payer: Medicare HMO

## 2013-12-21 ENCOUNTER — Ambulatory Visit: Payer: Medicare HMO | Admitting: Physical Therapy

## 2013-12-21 DIAGNOSIS — IMO0001 Reserved for inherently not codable concepts without codable children: Secondary | ICD-10-CM | POA: Diagnosis not present

## 2013-12-23 ENCOUNTER — Other Ambulatory Visit: Payer: Commercial Managed Care - HMO

## 2013-12-23 DIAGNOSIS — K859 Acute pancreatitis without necrosis or infection, unspecified: Secondary | ICD-10-CM

## 2013-12-23 DIAGNOSIS — R748 Abnormal levels of other serum enzymes: Secondary | ICD-10-CM

## 2013-12-23 LAB — GAMMA GT: GGT: 119 U/L — ABNORMAL HIGH (ref 7–51)

## 2013-12-23 LAB — COMPLETE METABOLIC PANEL WITH GFR
ALK PHOS: 92 U/L (ref 39–117)
ALT: 10 U/L (ref 0–53)
AST: 11 U/L (ref 0–37)
Albumin: 3.9 g/dL (ref 3.5–5.2)
BUN: 12 mg/dL (ref 6–23)
CO2: 25 meq/L (ref 19–32)
CREATININE: 0.74 mg/dL (ref 0.50–1.35)
Calcium: 9.3 mg/dL (ref 8.4–10.5)
Chloride: 100 mEq/L (ref 96–112)
GFR, Est Non African American: >89 mL/min
GLUCOSE: 101 mg/dL — AB (ref 70–99)
Potassium: 4.2 mEq/L (ref 3.5–5.3)
Sodium: 135 mEq/L (ref 135–145)
Total Bilirubin: 0.4 mg/dL (ref 0.2–1.2)
Total Protein: 6.5 g/dL (ref 6.0–8.3)

## 2013-12-24 ENCOUNTER — Telehealth: Payer: Self-pay | Admitting: *Deleted

## 2013-12-24 ENCOUNTER — Telehealth: Payer: Self-pay | Admitting: Cardiology

## 2013-12-24 ENCOUNTER — Ambulatory Visit: Payer: Medicare HMO | Admitting: Rehabilitative and Restorative Service Providers"

## 2013-12-24 ENCOUNTER — Ambulatory Visit: Payer: Medicare HMO | Admitting: Occupational Therapy

## 2013-12-24 DIAGNOSIS — IMO0001 Reserved for inherently not codable concepts without codable children: Secondary | ICD-10-CM | POA: Diagnosis not present

## 2013-12-24 NOTE — Telephone Encounter (Signed)
Message copied by Maureen Chatters on Fri Dec 24, 2013  2:14 PM ------      Message from: Shary Decamp B      Created: Wed Dec 22, 2013 12:22 PM                   ----- Message -----         From: Devoria Glassing         Sent: 12/22/2013  11:54 AM           To: Alyson Locket            And the number for carla moss is [phone] she is actually his case manager  ------

## 2013-12-24 NOTE — Telephone Encounter (Signed)
New message          Pt PCP received a phone call from the pt social worker about [employer] needing an approval for a heart monitor or authorization. Karla left fax number with pt PCP office Gabriel Cirri [phone].

## 2013-12-24 NOTE — Telephone Encounter (Signed)
Received message that [facility] needs prior authorization for [employer] event monitor that was placed in March 2015.  Spoke w/Katie in monitor room and she states they do not do prior authorization for monitors.  There have been some problems with [employer] taking a long time to pay for monitors.  Spoke w/Sabrina at [employer] at [phone] and gave her [employer]'s phone number.  Suggested she call them to see what they needed to approve the placement of the monitor. She will give them a call.

## 2013-12-24 NOTE — Telephone Encounter (Signed)
Message copied by Maureen Chatters on Fri Dec 24, 2013  2:10 PM ------      Message from: Shary Decamp B      Created: Wed Dec 22, 2013 12:22 PM                   ----- Message -----         From: Devoria Glassing         Sent: 12/22/2013  11:50 AM           To: Gwendolyn Lima moss calling from [facility] calling to say that we need to fax       An approval for heart moniter or authorization it got faxed to the wrong place the first time       Fax number [phone]      Number is [phone] ------

## 2013-12-24 NOTE — Telephone Encounter (Signed)
I contacted Benay Pillow in reference to pt and she stated that pt needed an authorization from his insurance company for a Holter monitor so that his insurance will pay for it. I called [facility]ology to see if they could help me in regarding what needs to be done a telephone note as been sent to [facility] from cardiology about this and will not be in office until Tuesday. Will wait for return call from her before sending referral to [facility].

## 2013-12-28 ENCOUNTER — Ambulatory Visit: Payer: Medicare HMO | Admitting: Rehabilitative and Restorative Service Providers"

## 2013-12-28 ENCOUNTER — Ambulatory Visit: Payer: Medicare HMO | Admitting: Occupational Therapy

## 2013-12-28 DIAGNOSIS — IMO0001 Reserved for inherently not codable concepts without codable children: Secondary | ICD-10-CM | POA: Diagnosis not present

## 2013-12-28 NOTE — Telephone Encounter (Signed)
Called [employer] to see about receiving authorization on the monitor that was place in March 2015, spoke with Katrina and she stated that the person that is handling the account is not in office today(Marsha). Lm on vm to r/t call.

## 2013-12-28 NOTE — Telephone Encounter (Signed)
Number to call [employer] is [phone]

## 2013-12-30 ENCOUNTER — Ambulatory Visit: Payer: Medicare HMO | Admitting: Physical Therapy

## 2013-12-30 ENCOUNTER — Ambulatory Visit: Payer: Medicare HMO | Admitting: Occupational Therapy

## 2013-12-30 DIAGNOSIS — IMO0001 Reserved for inherently not codable concepts without codable children: Secondary | ICD-10-CM | POA: Diagnosis not present

## 2013-12-30 NOTE — Telephone Encounter (Signed)
Left message with billing dept of [employer] on this pt about authorization

## 2013-12-31 ENCOUNTER — Ambulatory Visit (INDEPENDENT_AMBULATORY_CARE_PROVIDER_SITE_OTHER): Payer: Medicare HMO | Admitting: Surgery

## 2014-01-04 ENCOUNTER — Ambulatory Visit: Payer: Medicare HMO | Admitting: Occupational Therapy

## 2014-01-04 ENCOUNTER — Ambulatory Visit
Payer: Medicare HMO | Attending: Physical Medicine & Rehabilitation | Admitting: Rehabilitative and Restorative Service Providers"

## 2014-01-04 DIAGNOSIS — IMO0001 Reserved for inherently not codable concepts without codable children: Secondary | ICD-10-CM | POA: Insufficient documentation

## 2014-01-04 DIAGNOSIS — M6281 Muscle weakness (generalized): Secondary | ICD-10-CM | POA: Diagnosis not present

## 2014-01-04 DIAGNOSIS — R279 Unspecified lack of coordination: Secondary | ICD-10-CM | POA: Insufficient documentation

## 2014-01-04 NOTE — Telephone Encounter (Signed)
[employer] called back and stated that everything has been taking care of billing dept had made mistake in calling [facility].

## 2014-01-06 ENCOUNTER — Ambulatory Visit: Payer: Medicare HMO | Admitting: Physical Therapy

## 2014-01-06 ENCOUNTER — Ambulatory Visit: Payer: Medicare HMO | Admitting: Occupational Therapy

## 2014-01-06 DIAGNOSIS — IMO0001 Reserved for inherently not codable concepts without codable children: Secondary | ICD-10-CM | POA: Diagnosis not present

## 2014-01-11 ENCOUNTER — Ambulatory Visit: Payer: Medicare HMO | Admitting: Occupational Therapy

## 2014-01-11 ENCOUNTER — Ambulatory Visit: Payer: Medicare HMO | Admitting: Physical Therapy

## 2014-01-11 ENCOUNTER — Encounter: Payer: Medicare HMO | Admitting: Speech Pathology

## 2014-01-11 DIAGNOSIS — IMO0001 Reserved for inherently not codable concepts without codable children: Secondary | ICD-10-CM | POA: Diagnosis not present

## 2014-01-13 ENCOUNTER — Ambulatory Visit: Payer: Medicare HMO | Admitting: Physical Therapy

## 2014-01-13 ENCOUNTER — Encounter: Payer: Medicare HMO | Admitting: Occupational Therapy

## 2014-01-19 ENCOUNTER — Ambulatory Visit (INDEPENDENT_AMBULATORY_CARE_PROVIDER_SITE_OTHER): Payer: Medicare HMO | Admitting: Surgery

## 2014-01-19 ENCOUNTER — Encounter (INDEPENDENT_AMBULATORY_CARE_PROVIDER_SITE_OTHER): Payer: Self-pay | Admitting: Surgery

## 2014-01-19 VITALS — BP 122/70 | HR 78 | Temp 97.5°F | Ht 72.0 in | Wt 254.0 lb

## 2014-01-19 DIAGNOSIS — L0231 Cutaneous abscess of buttock: Secondary | ICD-10-CM

## 2014-01-19 DIAGNOSIS — Z720 Tobacco use: Secondary | ICD-10-CM

## 2014-01-19 DIAGNOSIS — F17200 Nicotine dependence, unspecified, uncomplicated: Secondary | ICD-10-CM

## 2014-01-19 DIAGNOSIS — L03317 Cellulitis of buttock: Secondary | ICD-10-CM | POA: Insufficient documentation

## 2014-01-19 MED ORDER — DOXYCYCLINE HYCLATE 100 MG PO TABS: 100.0000 mg | ORAL_TABLET | Freq: Two times a day (BID) | ORAL | Status: DC

## 2014-01-19 NOTE — Progress Notes (Signed)
Subjective:     Patient ID: Brad Taylor, male   DOB: 1951/06/20, 63 y.o.   MRN: [id number]  HPI  Note: Portions of this report may have been transcribed using voice recognition software. Every effort was made to ensure accuracy; however, inadvertent computerized transcription errors may be present.   Any transcriptional errors that result from this process are unintentional.            Brad Taylor  [birth year] [id number]  Patient Care Team: [name], MD as PCP - General (Family Medicine) Charlett Blake, MD as Consulting Physician (Physical Medicine and Rehabilitation)  This patient is a 63 y.o.male who presents today for surgical evaluation at the request of Dr. Dennard Schaumann.   Reason for visit: Recurrent gluteal infections.  Question of perirectal abscess.  Pleasant male.  Veteran.  Often gets care at [facility].  Diabetic smoker.  Developed stroke with right hemiparesis.  Mostly recovered but mild right upper remedy weakness.  Anticoagulated on Plavix.  No major bleeding issues.  Associated with Dr. Dennard Schaumann for primary care.    Over the past year, he has had intermittent infections on his inner buttock.  Underwent incision and drainage.  Still feels some pain and irritation.  Usually resolves with help of antibiotics.  Because of recurrent infections, concern for fistula.  Therefore, surgical consultation requested.  Has had colonoscopies through the [place] system.  Polyps in the distant past.  None recently.  No personal nor family history of GI/colon cancer, inflammatory bowel disease, irritable bowel syndrome, allergy such as Celiac Sprue, dietary/dairy problems, colitis, ulcers nor gastritis.  No recent sick contacts/gastroenteritis.  No travel outside the country.  No changes in diet.  No dysphagia to solids or liquids.  No significant heartburn or reflux.  No hematochezia, hematemesis, coffee ground emesis.  No evidence of prior gastric/peptic ulceration. Denies any  history of other infections elsewhere.  No history of hemorrhoid/fissure/fistula.   Patient Active Problem List   Diagnosis Date Noted  . Aphasia as late effect of stroke 11/09/2013  . Cryptogenic stroke 11/02/2013  . CVA (cerebral infarction) 09/20/2013  . Stroke 09/15/2013    Past Medical History  Diagnosis Date  . COPD (chronic obstructive pulmonary disease)   . Diabetes mellitus without complication   . Hypothyroidism   . GERD (gastroesophageal reflux disease)   . Ulcer   . Post traumatic stress disorder   . Depression   . Hyperlipidemia   . AAA (abdominal aortic aneurysm) 5/15    3.5x3.5 cm    Past Surgical History  Procedure Laterality Date  . Cholecystectomy    . Cataract extraction    . Knee surgery      History   Social History  . Marital Status: Married    Spouse Name: N/A    Number of Children: N/A  . Years of Education: N/A   Occupational History  . Not on file.   Social History Main Topics  . Smoking status: Former Smoker -- 1.00 packs/day    Types: Cigarettes  . Smokeless tobacco: Former Systems developer    Quit date: 05/04/2013  . Alcohol Use: No     Comment: Former heavy drinker, been sober for 10 years  . Drug Use: No  . Sexual Activity: Not on file   Other Topics Concern  . Not on file   Social History Narrative   Retired Dealer.    History reviewed. No pertinent family history.  Current Outpatient Prescriptions  Medication Sig Dispense Refill  .  atorvastatin (LIPITOR) 10 MG tablet Take 10 mg by mouth daily. Takes 5 mg      . buPROPion (WELLBUTRIN SR) 150 MG 12 hr tablet Take 1 tablet (150 mg total) by mouth daily.  30 tablet  1  . busPIRone (BUSPAR) 10 MG tablet Take 0.5 tablets (5 mg total) by mouth 2 (two) times daily as needed (for anxiety).  60 tablet  0  . clopidogrel (PLAVIX) 75 MG tablet Take 1 tablet (75 mg total) by mouth daily with breakfast.  30 tablet  1  . insulin glargine (LANTUS) 100 UNIT/ML injection 45 units every a.m. &  qhs      . levothyroxine (SYNTHROID, LEVOTHROID) 125 MCG tablet Take 1 tablet (125 mcg total) by mouth daily before breakfast.  30 tablet  1  . lisinopril (PRINIVIL,ZESTRIL) 10 MG tablet Take 1 tablet (10 mg total) by mouth daily.  30 tablet  1  . metroNIDAZOLE (FLAGYL) 500 MG tablet Take 1 tablet (500 mg total) by mouth 2 (two) times daily.  28 tablet  0  . RABEprazole (ACIPHEX) 20 MG tablet Take 2 tablets (40 mg total) by mouth daily before breakfast.  30 tablet  1  . traZODone (DESYREL) 100 MG tablet Take 3 tablets (300 mg total) by mouth at bedtime.  30 tablet  0  . venlafaxine XR (EFFEXOR-XR) 150 MG 24 hr capsule Take 1 capsule (150 mg total) by mouth daily with breakfast.  30 capsule  1   No current facility-administered medications for this visit.     No Known Allergies  BP 122/70  Pulse 78  Temp(Src) 97.5 F (36.4 C)  Ht 6' (1.829 m)  Wt 254 lb (115.214 kg)  BMI 34.44 kg/m2  No results found.   Review of Systems  Constitutional: Negative for fever, chills and diaphoresis.  HENT: Negative for ear discharge, facial swelling, mouth sores, nosebleeds, sore throat and trouble swallowing.   Eyes: Negative for photophobia, discharge and visual disturbance.  Respiratory: Negative for choking, chest tightness, shortness of breath and stridor.   Cardiovascular: Negative for chest pain and palpitations.  Gastrointestinal: Negative for nausea, vomiting, abdominal pain, diarrhea, constipation, blood in stool, abdominal distention, anal bleeding and rectal pain.  Endocrine: Negative for cold intolerance and heat intolerance.  Genitourinary: Negative for dysuria, urgency, difficulty urinating and testicular pain.  Musculoskeletal: Negative for arthralgias, back pain, gait problem and myalgias.  Skin: Negative for color change, pallor, rash and wound.  Allergic/Immunologic: Negative for environmental allergies and food allergies.  Neurological: Negative for dizziness, speech difficulty,  weakness, numbness and headaches.  Hematological: Negative for adenopathy. Does not bruise/bleed easily.  Psychiatric/Behavioral: Negative for hallucinations, confusion and agitation.       Objective:   Physical Exam  Constitutional: He is oriented to person, place, and time. He appears well-developed and well-nourished. No distress.  HENT:  Head: Normocephalic.  Mouth/Throat: Oropharynx is clear and moist. No oropharyngeal exudate.  Eyes: Conjunctivae and EOM are normal. Pupils are equal, round, and reactive to light. No scleral icterus.  Neck: Normal range of motion. Neck supple. No tracheal deviation present.  Cardiovascular: Normal rate, regular rhythm and intact distal pulses.   Pulmonary/Chest: Effort normal and breath sounds normal. No respiratory distress.  Abdominal: Soft. He exhibits no distension. There is no tenderness. Hernia confirmed negative in the right inguinal area and confirmed negative in the left inguinal area.  Genitourinary: Rectal exam shows no external hemorrhoid, no fissure and anal tone normal. Prostate is not enlarged and not  tender.     Exam done with assistance of male Medical Assistant in the room.  Perianal skin clean with good hygiene.  No pruritis.  No pilonidal disease.  No fissure.  No perianal sinus/drainage/fistula   External hemorrhoids none.  Normal sphincter tone.  Tolerates digital rectal exam.  No rectal masses.  Hemorrhoidal piles WNL   Musculoskeletal: Normal range of motion. He exhibits no tenderness.  Lymphadenopathy:    He has no cervical adenopathy.       Right: No inguinal adenopathy present.       Left: No inguinal adenopathy present.  Neurological: He is alert and oriented to person, place, and time. He displays no atrophy and no tremor. No cranial nerve deficit or sensory deficit. He displays no seizure activity. Coordination and gait normal. GCS eye subscore is 4. GCS verbal subscore is 5. GCS motor subscore is 6.  Mild right  upper extremity weakness (weaker shoulder and hand grip).  Consistent with prior hemiparesis/stroke March 2050  Skin: Skin is warm and dry. No rash noted. He is not diaphoretic. No erythema. No pallor.  Psychiatric: He has a normal mood and affect. His behavior is normal. Judgment and thought content normal.       Assessment:     Chronic scarring and irritation consistent with intermittent gluteal folliculitis.  No discrete abscess.  Too far away from anus to be considered perirectal abscess.   Tobacco abuse in patient with recent stroke and recurrent soft tissue infections    Plan:     I have discussed with the patient and his wife.  At this point, I am skeptical that this is a perirectal abscess or fistula.  There is no draining sinus.  Use over 8 cm from the anal verge clearly on the gluteus.  It is at a point of pressure and sitting.  He has some mild cellulitis.  Recommended doxycycline for 2 weeks.  Longer coverage & different antibiotic this time.  I strongly encouraged him to stop smoking.  I suspect the infection cannot clear while he is still smoking.  STOP SMOKING! We talked to the patient about the dangers of smoking.  We stressed that tobacco use dramatically increases the risk of peri-operative complications such as infection, tissue necrosis leaving to problems with incision/wound and organ healing, hernia, chronic pain, heart attack, stroke, DVT, pulmonary embolism, and death.  We noted there are programs in our community to help stop smoking.  Information was available.

## 2014-01-19 NOTE — Patient Instructions (Addendum)
Please consider the recommendations that we have given you today:  You have a tender star scar in urine in her right buttock consistent with chronic folliculitis.  No abscess.  No evidence of perirectal fistula/abscess.  Take antibiotics for 2 weeks.  Refill if not better.  Quit smoking to allow the area to heal and prevent new infections.  See the Handout(s) we have given you.  Please call our office at [phone] if you wish to schedule surgery or if you have further questions / concerns.    Folliculitis  Folliculitis is redness, soreness, and swelling (inflammation) of the hair follicles. This condition can occur anywhere on the body. People with weakened immune systems, diabetes, or obesity have a greater risk of getting folliculitis. CAUSES  Bacterial infection. This is the most common cause.  Fungal infection.  Viral infection.  Contact with certain chemicals, especially oils and tars. Long-term folliculitis can result from bacteria that live in the nostrils. The bacteria may trigger multiple outbreaks of folliculitis over time. SYMPTOMS Folliculitis most commonly occurs on the scalp, thighs, legs, back, buttocks, and areas where hair is shaved frequently. An early sign of folliculitis is a small, white or yellow, pus-filled, itchy lesion (pustule). These lesions appear on a red, inflamed follicle. They are usually less than 0.2 inches (5 mm) wide. When there is an infection of the follicle that goes deeper, it becomes a boil or furuncle. A group of closely packed boils creates a larger lesion (carbuncle). Carbuncles tend to occur in hairy, sweaty areas of the body. DIAGNOSIS  Your caregiver can usually tell what is wrong by doing a physical exam. A sample may be taken from one of the lesions and tested in a lab. This can help determine what is causing your folliculitis. TREATMENT  Treatment may include:  Applying warm compresses to the affected areas.  Taking antibiotic  medicines orally or applying them to the skin.  Draining the lesions if they contain a large amount of pus or fluid.  Laser hair removal for cases of long-lasting folliculitis. This helps to prevent regrowth of the hair. HOME CARE INSTRUCTIONS  Apply warm compresses to the affected areas as directed by your caregiver.  If antibiotics are prescribed, take them as directed. Finish them even if you start to feel better.  You may take over-the-counter medicines to relieve itching.  Do not shave irritated skin.  Follow up with your caregiver as directed. SEEK IMMEDIATE MEDICAL CARE IF:   You have increasing redness, swelling, or pain in the affected area.  You have a fever. MAKE SURE YOU:  Understand these instructions.  Will watch your condition.  Will get help right away if you are not doing well or get worse. Document Released: 09/30/2001 Document Revised: 01/21/2012 Document Reviewed: 10/22/2011 [facility] Patient Information 2015 [place], [place]. This information is not intended to replace advice given to you by your health care provider. Make sure you discuss any questions you have with your health care provider.  STOP SMOKING!  We strongly recommend that you stop smoking.  Smoking increases the risk of surgery including infection in the form of an open wound, pus formation, abscess, hernia at an incision on the abdomen, etc.  You have an increased risk of other MAJOR complications such as stroke, heart attack, forming clots in the leg and/or lungs, and death.    Smoking Cessation Quitting smoking is important to your health and has many advantages. However, it is not always easy to quit since nicotine  is a very addictive drug. Often times, people try 3 times or more before being able to quit. This document explains the best ways for you to prepare to quit smoking. Quitting takes hard work and a lot of effort, but you can do it. ADVANTAGES OF QUITTING SMOKING  You will live  longer, feel better, and live better.  Your body will feel the impact of quitting smoking almost immediately.  Within 20 minutes, blood pressure decreases. Your pulse returns to its normal level.  After 8 hours, carbon monoxide levels in the blood return to normal. Your oxygen level increases.  After 24 hours, the chance of having a heart attack starts to decrease. Your breath, hair, and body stop smelling like smoke.  After 48 hours, damaged nerve endings begin to recover. Your sense of taste and smell improve.  After 72 hours, the body is virtually free of nicotine. Your bronchial tubes relax and breathing becomes easier.  After 2 to 12 weeks, lungs can hold more air. Exercise becomes easier and circulation improves.  The risk of having a heart attack, stroke, cancer, or lung disease is greatly reduced.  After 1 year, the risk of coronary heart disease is cut in half.  After 5 years, the risk of stroke falls to the same as a nonsmoker.  After 10 years, the risk of lung cancer is cut in half and the risk of other cancers decreases significantly.  After 15 years, the risk of coronary heart disease drops, usually to the level of a nonsmoker.  If you are pregnant, quitting smoking will improve your chances of having a healthy baby.  The people you live with, especially any children, will be healthier.  You will have extra money to spend on things other than cigarettes. QUESTIONS TO THINK ABOUT BEFORE ATTEMPTING TO QUIT You may want to talk about your answers with your caregiver.  Why do you want to quit?  If you tried to quit in the past, what helped and what did not?  What will be the most difficult situations for you after you quit? How will you plan to handle them?  Who can help you through the tough times? Your family? Friends? A caregiver?  What pleasures do you get from smoking? What ways can you still get pleasure if you quit? Here are some questions to ask your  caregiver:  How can you help me to be successful at quitting?  What medicine do you think would be best for me and how should I take it?  What should I do if I need more help?  What is smoking withdrawal like? How can I get information on withdrawal? GET READY  Set a quit date.  Change your environment by getting rid of all cigarettes, ashtrays, matches, and lighters in your home, car, or work. Do not let people smoke in your home.  Review your past attempts to quit. Think about what worked and what did not. GET SUPPORT AND ENCOURAGEMENT You have a better chance of being successful if you have help. You can get support in many ways.  Tell your family, friends, and co-workers that you are going to quit and need their support. Ask them not to smoke around you.  Get individual, group, or telephone counseling and support. Programs are available at [employer] and health centers. Call your local health department for information about programs in your area.  Spiritual beliefs and practices may help some smokers quit.  Download a "quit meter" on  your computer to keep track of quit statistics, such as how long you have gone without smoking, cigarettes not smoked, and money saved.  Get a self-help book about quitting smoking and staying off of tobacco. [place] yourself from urges to smoke. Talk to someone, go for a walk, or occupy your time with a task.  Change your normal routine. Take a different route to work. Drink tea instead of coffee. Eat breakfast in a different place.  Reduce your stress. Take a hot bath, exercise, or read a book.  Plan something enjoyable to do every day. Reward yourself for not smoking.  Explore interactive web-based programs that specialize in helping you quit. GET MEDICINE AND USE IT CORRECTLY Medicines can help you stop smoking and decrease the urge to smoke. Combining medicine with the above behavioral methods and  support can greatly increase your chances of successfully quitting smoking.  Nicotine replacement therapy helps deliver nicotine to your body without the negative effects and risks of smoking. Nicotine replacement therapy includes nicotine gum, lozenges, inhalers, nasal sprays, and skin patches. Some may be available over-the-counter and others require a prescription.  Antidepressant medicine helps people abstain from smoking, but how this works is unknown. This medicine is available by prescription.  Nicotinic receptor partial agonist medicine simulates the effect of nicotine in your brain. This medicine is available by prescription. Ask your caregiver for advice about which medicines to use and how to use them based on your health history. Your caregiver will tell you what side effects to look out for if you choose to be on a medicine or therapy. Carefully read the information on the package. Do not use any other product containing nicotine while using a nicotine replacement product.  RELAPSE OR DIFFICULT SITUATIONS Most relapses occur within the first 3 months after quitting. Do not be discouraged if you start smoking again. Remember, most people try several times before finally quitting. You may have symptoms of withdrawal because your body is used to nicotine. You may crave cigarettes, be irritable, feel very hungry, cough often, get headaches, or have difficulty concentrating. The withdrawal symptoms are only temporary. They are strongest when you first quit, but they will go away within 10 14 days. To reduce the chances of relapse, try to:  Avoid drinking alcohol. Drinking lowers your chances of successfully quitting.  Reduce the amount of caffeine you consume. Once you quit smoking, the amount of caffeine in your body increases and can give you symptoms, such as a rapid heartbeat, sweating, and anxiety.  Avoid smokers because they can make you want to smoke.  Do not let weight gain distract  you. Many smokers will gain weight when they quit, usually less than 10 pounds. Eat a healthy diet and stay active. You can always lose the weight gained after you quit.  Find ways to improve your mood other than smoking. FOR MORE INFORMATION  www.smokefree.gov    While it can be one of the most difficult things to do, the [place] community has programs to help you stop.  Consider talking with your primary care physician about options.  Also, Smoking Cessation classes are available through the [facility]:  The smoking cessation program is a proven-effective program from the American Lung Association. The program is available for anyone 13 and older who currently smokes. The program lasts for 7 weeks and is 8 sessions. Each class will be approximately 1 1/2 hours. The program is every Tuesday.  All  classes are 12-1:30pm and same location.  Event Location Information:  Location: [place] 2nd Floor Conference Room 2-037; located next to [facility] cross streets: [place] Entrance into the [facility] is adjacent to the [employer] main entrance. The conference room is located on the 2nd floor.  Parking Instructions: Visitor parking is adjacent to [employer] main entrance and the [place]    A smoking cessation program is also offered through the [facility]. Register online at [email or web address].gl or call [phone] for more information.   Tobacco cessation counseling is available at [facility]. Call [phone] for a free appointment.   Tobacco cessation classes also are available through the [place] in [place]. For information, call [phone].   The Patient Education Network features videos on tobacco cessation. Please consult your listings in the center of this book to find instructions on how to access this resource.   If you  want more information, ask your nurse.      Shoulder Exercises EXERCISES  RANGE OF MOTION (ROM) AND STRETCHING EXERCISES These exercises may help you when beginning to rehabilitate your injury. Your symptoms may resolve with or without further involvement from your physician, physical therapist or athletic trainer. While completing these exercises, remember:  Restoring tissue flexibility helps normal motion to return to the joints. This allows healthier, less painful movement and activity. An effective stretch should be held for at least 30 seconds. A stretch should never be painful. You should only feel a gentle lengthening or release in the stretched tissue. ROM - Pendulum Bend at the waist so that your right / left arm falls away from your body. Support yourself with your opposite hand on a solid surface, such as a table or a countertop. Your right / left arm should be perpendicular to the ground. If it is not perpendicular, you need to lean over farther. Relax the muscles in your right / left arm and shoulder as much as possible. Gently sway your hips and trunk so they move your right / left arm without any use of your right / left shoulder muscles. Progress your movements so that your right / left arm moves side to side, then forward and backward, and finally, both clockwise and counterclockwise. Complete __________ repetitions in each direction. Many people use this exercise to relieve discomfort in their shoulder as well as to gain range of motion. Repeat __________ times. Complete this exercise __________ times per day. STRETCH - Flexion, Standing Stand with good posture. With an underhand grip on your right / left hand and an overhand grip on the opposite hand, grasp a broomstick or cane so that your hands are a little more than shoulder-width apart. Keeping your right / left elbow straight and shoulder muscles relaxed, push the stick with your opposite hand to raise your right / left  arm in front of your body and then overhead. Raise your arm until you feel a stretch in your right / left shoulder, but before you have increased shoulder pain. Try to avoid shrugging your right / left shoulder as your arm rises by keeping your shoulder blade tucked down and toward your mid-back spine. Hold __________ seconds. Slowly return to the starting position. Repeat __________ times. Complete this exercise __________ times per day. STRETCH - Internal Rotation Place your right / left hand behind your back, palm-up. Throw a towel or belt  over your opposite shoulder. Grasp the towel/belt with your right / left hand. While keeping an upright posture, gently pull up on the towel/belt until you feel a stretch in the front of your right / left shoulder. Avoid shrugging your right / left shoulder as your arm rises by keeping your shoulder blade tucked down and toward your mid-back spine. Hold __________. Release the stretch by lowering your opposite hand. Repeat __________ times. Complete this exercise __________ times per day. STRETCH - External Rotation and Abduction Stagger your stance through a doorframe. It does not matter which foot is forward. As instructed by your physician, physical therapist or athletic trainer, place your hands: And forearms above your head and on the door frame. And forearms at head-height and on the door frame. At elbow-height and on the door frame. Keeping your head and chest upright and your stomach muscles tight to prevent over-extending your low-back, slowly shift your weight onto your front foot until you feel a stretch across your chest and/or in the front of your shoulders. Hold __________ seconds. Shift your weight to your back foot to release the stretch. Repeat __________ times. Complete this stretch __________ times per day.  STRENGTHENING EXERCISES  These exercises may help you when beginning to rehabilitate your injury. They may resolve your symptoms  with or without further involvement from your physician, physical therapist or athletic trainer. While completing these exercises, remember:  Muscles can gain both the endurance and the strength needed for everyday activities through controlled exercises. Complete these exercises as instructed by your physician, physical therapist or athletic trainer. Progress the resistance and repetitions only as guided. You may experience muscle soreness or fatigue, but the pain or discomfort you are trying to eliminate should never worsen during these exercises. If this pain does worsen, stop and make certain you are following the directions exactly. If the pain is still present after adjustments, discontinue the exercise until you can discuss the trouble with your clinician. If advised by your physician, during your recovery, avoid activity or exercises which involve actions that place your right / left hand or elbow above your head or behind your back or head. These positions stress the tissues which are trying to heal. STRENGTH - Scapular Depression and Adduction With good posture, sit on a firm chair. Supported your arms in front of you with pillows, arm rests or a table top. Have your elbows in line with the sides of your body. Gently draw your shoulder blades down and toward your mid-back spine. Gradually increase the tension without tensing the muscles along the top of your shoulders and the back of your neck. Hold for __________ seconds. Slowly release the tension and relax your muscles completely before completing the next repetition. After you have practiced this exercise, remove the arm support and complete it in standing as well as sitting. Repeat __________ times. Complete this exercise __________ times per day.  STRENGTH - External Rotators Secure a rubber exercise band/tubing to a fixed object so that it is at the same height as your right / left elbow when you are standing or sitting on a firm  surface. Stand or sit so that the secured exercise band/tubing is at your side that is not injured. Bend your elbow 90 degrees. Place a folded towel or small pillow under your right / left arm so that your elbow is a few inches away from your side. Keeping the tension on the exercise band/tubing, pull it away from your body, as if  pivoting on your elbow. Be sure to keep your body steady so that the movement is only coming from your shoulder rotating. Hold __________ seconds. Release the tension in a controlled manner as you return to the starting position. Repeat __________ times. Complete this exercise __________ times per day.  STRENGTH - Supraspinatus Stand or sit with good posture. Grasp a __________ weight or an exercise band/tubing so that your hand is "thumbs-up," like when you shake hands. Slowly lift your right / left hand from your thigh into the air, traveling about 30 degrees from straight out at your side. Lift your hand to shoulder height or as far as you can without increasing any shoulder pain. Initially, many people do not lift their hands above shoulder height. Avoid shrugging your right / left shoulder as your arm rises by keeping your shoulder blade tucked down and toward your mid-back spine. Hold for __________ seconds. Control the descent of your hand as you slowly return to your starting position. Repeat __________ times. Complete this exercise __________ times per day.  STRENGTH - Shoulder Extensors Secure a rubber exercise band/tubing so that it is at the height of your shoulders when you are either standing or sitting on a firm arm-less chair. With a thumbs-up grip, grasp an end of the band/tubing in each hand. Straighten your elbows and lift your hands straight in front of you at shoulder height. Step back away from the secured end of band/tubing until it becomes tense. Squeezing your shoulder blades together, pull your hands down to the sides of your thighs. Do not allow  your hands to go behind you. Hold for __________ seconds. Slowly ease the tension on the band/tubing as you reverse the directions and return to the starting position. Repeat __________ times. Complete this exercise __________ times per day.  STRENGTH - Scapular Retractors Secure a rubber exercise band/tubing so that it is at the height of your shoulders when you are either standing or sitting on a firm arm-less chair. With a palm-down grip, grasp an end of the band/tubing in each hand. Straighten your elbows and lift your hands straight in front of you at shoulder height. Step back away from the secured end of band/tubing until it becomes tense. Squeezing your shoulder blades together, draw your elbows back as you bend them. Keep your upper arm lifted away from your body throughout the exercise. Hold __________ seconds. Slowly ease the tension on the band/tubing as you reverse the directions and return to the starting position. Repeat __________ times. Complete this exercise __________ times per day. STRENGTH - Scapular Depressors Find a sturdy chair without wheels, such as a from a dining room table. Keeping your feet on the floor, lift your bottom from the seat and lock your elbows. Keeping your elbows straight, allow gravity to pull your body weight down. Your shoulders will rise toward your ears. Raise your body against gravity by drawing your shoulder blades down your back, shortening the distance between your shoulders and ears. Although your feet should always maintain contact with the floor, your feet should progressively support less body weight as you get stronger. Hold __________ seconds. In a controlled and slow manner, lower your body weight to begin the next repetition. Repeat __________ times. Complete this exercise __________ times per day.  Document Released: 06/05/2005 Document Revised: 10/14/2011 Document Reviewed: 11/03/2008 [facility] Patient Information 2015 [place], [place].  This information is not intended to replace advice given to you by your health care provider. Make sure you discuss  any questions you have with your health care provider.  

## 2014-02-01 ENCOUNTER — Encounter: Payer: Self-pay | Admitting: Nurse Practitioner

## 2014-02-01 ENCOUNTER — Ambulatory Visit (INDEPENDENT_AMBULATORY_CARE_PROVIDER_SITE_OTHER): Payer: Commercial Managed Care - HMO | Admitting: Nurse Practitioner

## 2014-02-01 ENCOUNTER — Encounter (INDEPENDENT_AMBULATORY_CARE_PROVIDER_SITE_OTHER): Payer: Self-pay

## 2014-02-01 VITALS — BP 131/71 | HR 58 | Ht 73.0 in | Wt 258.0 lb

## 2014-02-01 DIAGNOSIS — I69920 Aphasia following unspecified cerebrovascular disease: Secondary | ICD-10-CM

## 2014-02-01 DIAGNOSIS — I6350 Cerebral infarction due to unspecified occlusion or stenosis of unspecified cerebral artery: Secondary | ICD-10-CM

## 2014-02-01 DIAGNOSIS — I639 Cerebral infarction, unspecified: Secondary | ICD-10-CM

## 2014-02-01 DIAGNOSIS — I69320 Aphasia following cerebral infarction: Secondary | ICD-10-CM

## 2014-02-01 MED ORDER — CLOPIDOGREL BISULFATE 75 MG PO TABS: 75.0000 mg | ORAL_TABLET | Freq: Every day | ORAL | Status: AC

## 2014-02-01 NOTE — Patient Instructions (Signed)
Continue clopidogrel 75 mg orally every day  for secondary stroke prevention and maintain strict control of hypertension with blood pressure goal below 130/90, diabetes with hemoglobin A1c goal below 6.5% and lipids with LDL cholesterol goal below 100 mg/dL. Followup in the future with me in 4 months, sooner as needed.  STROKE/TIA INSTRUCTIONS SMOKING Cigarette smoking nearly doubles your risk of having a stroke & is the single most alterable risk factor  If you smoke or have smoked in the last 12 months, you are advised to quit smoking for your health.  Most of the excess cardiovascular risk related to smoking disappears within a year of stopping.  Ask you doctor about anti-smoking medications  [place] Quit Line: 1-800-QUIT NOW  Free Smoking Cessation Classes ([phone]  CHOLESTEROL Know your levels; limit fat & cholesterol in your diet  Lab Results  Component Value Date   CHOL 223* 10/27/2013   HDL 42 10/27/2013   LDLCALC 138* 10/27/2013   TRIG 216* 10/27/2013   CHOLHDL 5.3 10/27/2013      Many patients benefit from treatment even if their cholesterol is at goal.  Goal: Total Cholesterol less than 160  Goal:  LDL less than 100  Goal:  HDL greater than 40  Goal:  Triglycerides less than 150  BLOOD PRESSURE American Stroke Association blood pressure target is less that 120/80 mm/Hg  Your discharge blood pressure is:  BP: 131/71 mmHg  Monitor your blood pressure  Limit your salt and alcohol intake  Many individuals will require more than one medication for high blood pressure  DIABETES (A1c is a blood sugar average for last 3 months) Goal A1c is under 7% (A1c is blood sugar average for last 3 months)  Diabetes: Diagnosis of diabetes:  A1c was not drawn this admission    Lab Results  Component Value Date   HGBA1C 7.4* 09/16/2013    Your A1c can be lowered with medications, healthy diet, and exercise.  Check your blood sugar as directed by your physician  Call your physician  if you experience unexplained or low blood sugars.  PHYSICAL ACTIVITY/REHABILITATION Goal is 30 minutes at least 4 days per week    Activity decreases your risk of heart attack and stroke and makes your heart stronger.  It helps control your weight and blood pressure; helps you relax and can improve your mood.  Participate in a regular exercise program.  Talk with your doctor about the best form of exercise for you (dancing, walking, swimming, cycling).  DIET/WEIGHT Goal is to maintain a healthy weight  Your height is:  Height: 6\' 1"  (185.4 cm) Your current weight is: Weight: 258 lb (117.028 kg) Your body Mass Index (BMI) is:  BMI (Calculated): 34.1  Following the type of diet specifically designed for you will help prevent another stroke.  Your goal Body Mass Index (BMI) is 19-24.  Healthy food habits can help reduce 3 risk factors for stroke:  High cholesterol, hypertension, and excess weight.

## 2014-02-01 NOTE — Progress Notes (Signed)
PATIENT: Brad Taylor DOB: 03-Feb-1951  REASON FOR VISIT: hospital follow up for stroke HISTORY FROM: patient  HISTORY OF PRESENT ILLNESS: This is a 63 year old male multiple medical problems comes in for hist first office visit post-stroke.  He was admitted to [facility] September 15, 2013, with right-sided weakness and slurred speech.  MRI of the brain showed acute ischemic infarct to posterior limb of the  left internal capsule as well as additional subcentimeter ischemic deep white matter infarcts. MRA of the head with no large vessel stenosis or occlusion. Echocardiogram showed estimated ejection fraction was in the range of 60% to 65%.  No defect or patent foramen ovale was identified. Carotid Dopplers with no ICA stenosis. The patient did not receive tPA due to delay in arrival. He wore 30-day E-Cardio 30 day cardiac event monitor, which showed NSR with occasional PVC's which are benign.  Stroke was thought to be thrombotic due to small vessel disease.  Since March was having LLQ abdominal pain which was thought to be diverticulitis, treated with antibiotics.  CT scan showed mild pancreatitis, advanced atherosclerotic disease with infrarenal abdominal aortic aneurysm and mild aneurysmal dilatation of the common iliac arteries. Plan to follow up aaa in 1 year.  Mild aphasia and mild R fine motor deficits persist, excellent progress functionally independent, with mainly working on fine motor as well as speech deficits.  He has returned to driving.  He is tolerating Plavix well with no signs of significant bleeding or bruising.  He states his diabetes has been under good control, though is A1c was slightly elevated in hospital at 7.4  His morning fasting sugars average 90-110, and since he has lost some weight he has had some lows in the 60's.  His blood pressure is well controlled, it is 131/71 in the office today.  REVIEW OF SYSTEMS: Full 14 system review of systems performed and notable only for:  depression, anxiety.  ALLERGIES: No Known Allergies  HOME MEDICATIONS: Outpatient Prescriptions Prior to Visit  Medication Sig Dispense Refill  . atorvastatin (LIPITOR) 10 MG tablet Take 10 mg by mouth daily. Takes 5 mg      . buPROPion (WELLBUTRIN SR) 150 MG 12 hr tablet Take 1 tablet (150 mg total) by mouth daily.  30 tablet  1  . busPIRone (BUSPAR) 10 MG tablet Take 0.5 tablets (5 mg total) by mouth 2 (two) times daily as needed (for anxiety).  60 tablet  0  . clopidogrel (PLAVIX) 75 MG tablet Take 1 tablet (75 mg total) by mouth daily with breakfast.  30 tablet  1  . doxycycline (VIBRA-TABS) 100 MG tablet Take 1 tablet (100 mg total) by mouth 2 (two) times daily.  28 tablet  2  . insulin glargine (LANTUS) 100 UNIT/ML injection 45 units every a.m. & qhs      . levothyroxine (SYNTHROID, LEVOTHROID) 125 MCG tablet Take 1 tablet (125 mcg total) by mouth daily before breakfast.  30 tablet  1  . metroNIDAZOLE (FLAGYL) 500 MG tablet Take 1 tablet (500 mg total) by mouth 2 (two) times daily.  28 tablet  0  . RABEprazole (ACIPHEX) 20 MG tablet Take 2 tablets (40 mg total) by mouth daily before breakfast.  30 tablet  1  . traZODone (DESYREL) 100 MG tablet Take 3 tablets (300 mg total) by mouth at bedtime.  30 tablet  0  . venlafaxine XR (EFFEXOR-XR) 150 MG 24 hr capsule Take 1 capsule (150 mg total) by mouth daily with breakfast.  30 capsule  1  . lisinopril (PRINIVIL,ZESTRIL) 10 MG tablet Take 1 tablet (10 mg total) by mouth daily.  30 tablet  1   No facility-administered medications prior to visit.    PHYSICAL EXAM Filed Vitals:   02/01/14 1515  Height: 6\' 1"  (1.854 m)  Weight: 258 lb (117.028 kg)   Body mass index is 34.05 kg/(m^2). No exam data present  Generalized: Well developed, in no acute distress  Head: normocephalic and atraumatic. Oropharynx benign  Neck: Supple, no carotid bruits  Cardiac: Regular rate rhythm, no murmur  Musculoskeletal: No deformity   Neurological  examination  Mentation: Alert oriented to time, place, history taking. Follows all commands. Speech and language with mild-moderate dysfluency, mild expressive aphasia. Cranial nerve II-XII: Fundoscopic exam not done. Pupils were equal round reactive to light extraocular movements were full, visual field were full on confrontational test. Facial sensation and strength were normal. hearing was intact to finger rubbing bilaterally. Uvula tongue midline. Head turning and shoulder shrug and were normal and symmetric.Tongue protrusion into cheek strength was normal. Motor: The motor testing reveals 5 over 5 strength of all 4 extremities. Good symmetric motor tone is noted throughout. Left orbits right extremity. Decreased fine motor movements in the right hand. Sensory: Sensory testing is intact to soft touch on all 4 extremities, except some sensory loss in lateral portion of right hand; fingers 4-5. No evidence of extinction is noted.  Coordination: Cerebellar testing reveals mild finger-nose-finger ataxia on the right, left normal.  Heel-to-shin normal bilaterally.  Gait and station: Gait is normal. Tandem gait is unsteady. Romberg is negative. Reflexes: Deep tendon reflexes are symmetric and normal bilaterally.   NIHSS: 2 mRan: 1  ASSESSMENT: 63 y.o. year old male  has a past medical history of COPD (chronic obstructive pulmonary disease); Diabetes mellitus without complication; Hypothyroidism; GERD (gastroesophageal reflux disease); Ulcer; Post traumatic stress disorder; Depression; Hyperlipidemia; and AAA (abdominal aortic aneurysm) (5/15) with a Left MCA thrombotic infarct 09/15/13, with resultant mild expressive aphasia and mild R fine motor deficits. Stroke thought to be from small vessel disease.  Excellent progress, functionally independent, with mainly working on fine motor as well as speech deficits.   PLAN: I had a long discussion with the patient and wife regarding his recent stroke,  discussed results of evaluation in the hospital and answered questions.  Continue clopidogrel 75 mg orally every day  for secondary stroke prevention and maintain strict control of hypertension with blood pressure goal below 140/90, diabetes with hemoglobin A1c goal below 7% and lipids with LDL cholesterol goal below 70 mg/dL. Followup in the future in 4 months, sooner as needed.  [name], MSN, NP-C 02/01/2014, 3:17 PM [facility] [address] [place], [place] [address] [phone]  Note: This document was prepared with digital dictation and possible smart phrase technology. Any transcriptional errors that result from this process are unintentional.

## 2014-03-03 ENCOUNTER — Telehealth: Payer: Self-pay | Admitting: *Deleted

## 2014-03-03 NOTE — Telephone Encounter (Signed)
Wife called stating that pt has received a summons for [employer] and wants to know if you can write a note stating that he not able to participate in [employer] d/t his declining health. Pt has jury duty on Sept 13.

## 2014-03-04 ENCOUNTER — Encounter: Payer: Self-pay | Admitting: Family Medicine

## 2014-03-04 NOTE — Telephone Encounter (Signed)
Pt's wife aware and letter mailed to pt per her request

## 2014-03-04 NOTE — Telephone Encounter (Signed)
Letter is on my desk

## 2014-06-06 ENCOUNTER — Ambulatory Visit: Payer: Commercial Managed Care - HMO | Admitting: Neurology

## 2014-11-08 ENCOUNTER — Ambulatory Visit (INDEPENDENT_AMBULATORY_CARE_PROVIDER_SITE_OTHER): Payer: Commercial Managed Care - HMO | Admitting: Family Medicine

## 2014-11-08 ENCOUNTER — Encounter: Payer: Self-pay | Admitting: Family Medicine

## 2014-11-08 VITALS — BP 110/80 | HR 78 | Temp 98.1°F | Resp 18 | Ht 70.75 in | Wt 260.0 lb

## 2014-11-08 DIAGNOSIS — K858 Other acute pancreatitis without necrosis or infection: Secondary | ICD-10-CM

## 2014-11-08 DIAGNOSIS — K859 Acute pancreatitis, unspecified: Secondary | ICD-10-CM | POA: Diagnosis not present

## 2014-11-08 LAB — CBC W/MCH & 3 PART DIFF
HEMATOCRIT: 46.2 % (ref 39.0–52.0)
HEMOGLOBIN: 16.3 g/dL (ref 13.0–17.0)
LYMPHS ABS: 3.7 10*3/uL (ref 0.7–4.0)
LYMPHS PCT: 33 % (ref 12–46)
MCH: 30.8 pg (ref 26.0–34.0)
MCHC: 35.3 g/dL (ref 30.0–36.0)
MCV: 87.3 fL (ref 78.0–100.0)
NEUTROS ABS: 6.4 10*3/uL (ref 1.7–7.7)
Neutrophils Relative %: 58 % (ref 43–77)
Platelets: 210 10*3/uL (ref 150–400)
RBC: 5.29 MIL/uL (ref 4.22–5.81)
RDW: 14.3 % (ref 11.5–15.5)
WBC: 11.1 10*3/uL — ABNORMAL HIGH (ref 4.0–10.5)
WBCMIX: 1 10*3/uL (ref 0.1–1.8)
WBCMIXPER: 9 % (ref 3–18)

## 2014-11-08 MED ORDER — ONDANSETRON HCL 4 MG PO TABS
4.0000 mg | ORAL_TABLET | Freq: Three times a day (TID) | ORAL | Status: DC | PRN
Start: 2014-11-08 — End: 2017-07-22

## 2014-11-08 NOTE — Progress Notes (Signed)
Subjective:    Patient ID: Brad Taylor, male    DOB: Jan 05, 1951, 64 y.o.   MRN: [id number]  HPI  Patient admits that when he was younger he drank heavily. However he has not drank in many years. Last year, saw the patient for some mid epigastric lower abdominal pain. CT scan of the abdomen and pelvis revealed mild pancreatitis. Patient's symptoms improved with bowel rest and time. Recently 2 weeks ago, his symptoms returned. He reports gnawing epigastric abdominal discomfort. It is exacerbated by eating. He reports early satiety. The pain is constant. It is improved when he lies down and when he avoids food. He denies any fever. He denies any chills. He denies any melena. He denies any hematochezia. He denies any diarrhea. He denies any constipation. He does report nausea. He denies any vomiting. Past Medical History  Diagnosis Date  . COPD (chronic obstructive pulmonary disease)   . Diabetes mellitus without complication   . Hypothyroidism   . GERD (gastroesophageal reflux disease)   . Ulcer   . Post traumatic stress disorder   . Depression   . Hyperlipidemia   . AAA (abdominal aortic aneurysm) 5/15    3.5x3.5 cm  . Thyroid disease    Past Surgical History  Procedure Laterality Date  . Cholecystectomy    . Cataract extraction    . Knee surgery     Current Outpatient Prescriptions on File Prior to Visit  Medication Sig Dispense Refill  . atorvastatin (LIPITOR) 10 MG tablet Take 10 mg by mouth daily. Takes 5 mg    . buPROPion (WELLBUTRIN SR) 150 MG 12 hr tablet Take 1 tablet (150 mg total) by mouth daily. 30 tablet 1  . busPIRone (BUSPAR) 10 MG tablet Take 0.5 tablets (5 mg total) by mouth 2 (two) times daily as needed (for anxiety). 60 tablet 0  . clopidogrel (PLAVIX) 75 MG tablet Take 1 tablet (75 mg total) by mouth daily with breakfast. 90 tablet 3  . insulin glargine (LANTUS) 100 UNIT/ML injection 40 units every a.m. & 50 units qhs    . levothyroxine (SYNTHROID, LEVOTHROID)  125 MCG tablet Take 1 tablet (125 mcg total) by mouth daily before breakfast. 30 tablet 1  . RABEprazole (ACIPHEX) 20 MG tablet Take 2 tablets (40 mg total) by mouth daily before breakfast. 30 tablet 1  . traZODone (DESYREL) 100 MG tablet Take 3 tablets (300 mg total) by mouth at bedtime. 30 tablet 0  . venlafaxine XR (EFFEXOR-XR) 150 MG 24 hr capsule Take 1 capsule (150 mg total) by mouth daily with breakfast. 30 capsule 1   No current facility-administered medications on file prior to visit.   No Known Allergies History   Social History  . Marital Status: Married    Spouse Name: Diane  . Number of Children: 1  . Years of Education: HS   Occupational History  . Retired    Social History Main Topics  . Smoking status: Former Smoker -- 1.00 packs/day for 30 years    Types: Cigarettes  . Smokeless tobacco: Former Systems developer    Quit date: 05/04/2013  . Alcohol Use: No     Comment: Former heavy drinker, been sober for 10 years  . Drug Use: No  . Sexual Activity: Not on file   Other Topics Concern  . Not on file   Social History Narrative   Patient lives at home with spouse.   Caffeine Use: 3-4 cups daily   Retired Dealer.  Review of Systems  All other systems reviewed and are negative.      Objective:   Physical Exam  Constitutional: He appears well-developed and well-nourished.  Neck: Neck supple.  Cardiovascular: Normal rate, regular rhythm and normal heart sounds.   Pulmonary/Chest: Effort normal and breath sounds normal. No respiratory distress. He has no wheezes. He has no rales. He exhibits no tenderness.  Abdominal: Soft. Bowel sounds are decreased. There is tenderness. There is no rebound and no guarding.  Lymphadenopathy:    He has no cervical adenopathy.  Vitals reviewed.         Assessment & Plan:  Other acute pancreatitis - Plan: ondansetron (ZOFRAN) 4 MG tablet, COMPLETE METABOLIC PANEL WITH GFR, Lipase, CBC w/MCH & 3 Part Diff  Stat CBC today in  clinic shows a white blood cell count of 11.1. I recommended that the patient consume only clear liquids. Patient can use Zofran 4 mg every 8 hours as needed for nausea area I will obtain a lipase, CBC, CMP. I will also schedule the patient for a CT scan of the abdomen and pelvis to evaluate for causes of pancreatitis. The patient is not drinking. He does not have a gallbladder. Recheck in 48 hours or sooner if worse

## 2014-11-09 LAB — COMPLETE METABOLIC PANEL WITH GFR
ALBUMIN: 4 g/dL (ref 3.5–5.2)
ALT: 12 U/L (ref 0–53)
AST: 10 U/L (ref 0–37)
Alkaline Phosphatase: 72 U/L (ref 39–117)
BUN: 10 mg/dL (ref 6–23)
CHLORIDE: 94 meq/L — AB (ref 96–112)
CO2: 28 mEq/L (ref 19–32)
Calcium: 8.9 mg/dL (ref 8.4–10.5)
Creat: 0.9 mg/dL (ref 0.50–1.35)
GFR, Est African American: >89 mL/min
GFR, Est Non African American: 89 mL/min
Glucose, Bld: 187 mg/dL — ABNORMAL HIGH (ref 70–99)
Potassium: 4.5 mEq/L (ref 3.5–5.3)
Sodium: 131 mEq/L — ABNORMAL LOW (ref 135–145)
Total Bilirubin: 0.3 mg/dL (ref 0.2–1.2)
Total Protein: 6.1 g/dL (ref 6.0–8.3)

## 2014-11-09 LAB — LIPASE: Lipase: 55 U/L (ref 0–75)

## 2014-11-11 ENCOUNTER — Encounter: Payer: Self-pay | Admitting: Family Medicine

## 2014-11-11 ENCOUNTER — Ambulatory Visit (INDEPENDENT_AMBULATORY_CARE_PROVIDER_SITE_OTHER): Payer: Commercial Managed Care - HMO | Admitting: Family Medicine

## 2014-11-11 VITALS — BP 122/76 | HR 76 | Temp 97.5°F | Resp 18 | Ht 70.5 in | Wt 261.0 lb

## 2014-11-11 DIAGNOSIS — K8500 Idiopathic acute pancreatitis without necrosis or infection: Secondary | ICD-10-CM

## 2014-11-11 NOTE — Progress Notes (Signed)
Subjective:    Patient ID: Brad Taylor, male    DOB: July 24, 1951, 64 y.o.   MRN: [id number]  HPI  11/08/14 Patient admits that when he was younger he drank heavily. However he has not drank in many years. Last year, saw the patient for some mid epigastric lower abdominal pain. CT scan of the abdomen and pelvis revealed mild pancreatitis. Patient's symptoms improved with bowel rest and time. Recently 2 weeks ago, his symptoms returned. He reports gnawing epigastric abdominal discomfort. It is exacerbated by eating. He reports early satiety. The pain is constant. It is improved when he lies down and when he avoids food. He denies any fever. He denies any chills. He denies any melena. He denies any hematochezia. He denies any diarrhea. He denies any constipation. He does report nausea. He denies any vomiting.  At that time, my plan was: Stat CBC today in clinic shows a white blood cell count of 11.1. I recommended that the patient consume only clear liquids. Patient can use Zofran 4 mg every 8 hours as needed for nausea area I will obtain a lipase, CBC, CMP. I will also schedule the patient for a CT scan of the abdomen and pelvis to evaluate for causes of pancreatitis. The patient is not drinking. He does not have a gallbladder. Recheck in 48 hours or sooner if worse  11/11/14 A she states that he is 100% better today. He is no longer nauseated. He denies any pain in the abdomen. His CBC, CMP, and lipase were completely normal. Patient is back to his normal state of health. He is able to eat whatever food he wants today without problems. He is smiling and happy on examination today Past Medical History  Diagnosis Date  . COPD (chronic obstructive pulmonary disease)   . Diabetes mellitus without complication   . Hypothyroidism   . GERD (gastroesophageal reflux disease)   . Ulcer   . Post traumatic stress disorder   . Depression   . Hyperlipidemia   . AAA (abdominal aortic aneurysm) 5/15   3.5x3.5 cm  . Thyroid disease    Past Surgical History  Procedure Laterality Date  . Cholecystectomy    . Cataract extraction    . Knee surgery     Current Outpatient Prescriptions on File Prior to Visit  Medication Sig Dispense Refill  . atorvastatin (LIPITOR) 10 MG tablet Take 10 mg by mouth daily. Takes 5 mg    . buPROPion (WELLBUTRIN SR) 150 MG 12 hr tablet Take 1 tablet (150 mg total) by mouth daily. 30 tablet 1  . busPIRone (BUSPAR) 10 MG tablet Take 0.5 tablets (5 mg total) by mouth 2 (two) times daily as needed (for anxiety). 60 tablet 0  . clopidogrel (PLAVIX) 75 MG tablet Take 1 tablet (75 mg total) by mouth daily with breakfast. 90 tablet 3  . insulin glargine (LANTUS) 100 UNIT/ML injection 40 units every a.m. & 50 units qhs    . levothyroxine (SYNTHROID, LEVOTHROID) 125 MCG tablet Take 1 tablet (125 mcg total) by mouth daily before breakfast. 30 tablet 1  . ondansetron (ZOFRAN) 4 MG tablet Take 1 tablet (4 mg total) by mouth every 8 (eight) hours as needed for nausea or vomiting. 20 tablet 0  . RABEprazole (ACIPHEX) 20 MG tablet Take 2 tablets (40 mg total) by mouth daily before breakfast. 30 tablet 1  . traZODone (DESYREL) 100 MG tablet Take 3 tablets (300 mg total) by mouth at bedtime. 30 tablet 0  .  venlafaxine XR (EFFEXOR-XR) 150 MG 24 hr capsule Take 1 capsule (150 mg total) by mouth daily with breakfast. 30 capsule 1   No current facility-administered medications on file prior to visit.   No Known Allergies History   Social History  . Marital Status: Married    Spouse Name: Brad Taylor  . Number of Children: 1  . Years of Education: HS   Occupational History  . Retired    Social History Main Topics  . Smoking status: Former Smoker -- 1.00 packs/day for 30 years    Types: Cigarettes  . Smokeless tobacco: Former Systems developer    Quit date: 05/04/2013  . Alcohol Use: No     Comment: Former heavy drinker, been sober for 10 years  . Drug Use: No  . Sexual Activity: Not on  file   Other Topics Concern  . Not on file   Social History Narrative   Patient lives at home with spouse.   Caffeine Use: 3-4 cups daily   Retired Dealer.     Review of Systems  All other systems reviewed and are negative.      Objective:   Physical Exam  Constitutional: He appears well-developed and well-nourished.  Neck: Neck supple.  Cardiovascular: Normal rate, regular rhythm and normal heart sounds.   Pulmonary/Chest: Effort normal and breath sounds normal. No respiratory distress. He has no wheezes. He has no rales. He exhibits no tenderness.  Abdominal: Soft. Bowel sounds are normal. There is no tenderness. There is no rebound and no guarding.  Lymphadenopathy:    He has no cervical adenopathy.  Vitals reviewed.         Assessment & Plan:  Idiopathic acute pancreatitis  Idiopathic acute pancreatitis  Patient is back to his normal state of health today. I question the original diagnosis. The patient seems to improve rapidly for this to be pancreatitis. Furthermore his lipase was normal. At the present time the patient is not ill at all. Furthermore he would like to cancel the CT scan we have scheduled for Monday. Patient states that he had a CT scan performed less than a month ago at the [place]. I would like to obtain those records and review this. If the CT obtained at the [place] is normal, I feel no further follow-up is necessary for this particular issue

## 2014-11-14 ENCOUNTER — Other Ambulatory Visit: Payer: Medicare HMO

## 2014-11-15 ENCOUNTER — Telehealth: Payer: Self-pay | Admitting: *Deleted

## 2014-11-15 NOTE — Telephone Encounter (Signed)
Submitted [facility] referral thru [facility] for authorization on 11/09/14 to [place] with authorization [id number]  Requesting provider: Cammie Mcgee. Pickard,MD  Treating provider:  Imaging at [facility]  Number of visits:1  Place of service: Imaging center  Type of service: CT  Procedure: 74177-CT-Abd&Pelvis w/Contrast  Start Date:11/09/14  End Date: 05/08/15  Dx: K85.8-other acute pancreatitis

## 2015-04-27 ENCOUNTER — Ambulatory Visit: Payer: Commercial Managed Care - HMO

## 2015-04-27 ENCOUNTER — Ambulatory Visit (INDEPENDENT_AMBULATORY_CARE_PROVIDER_SITE_OTHER): Payer: Commercial Managed Care - HMO | Admitting: Family Medicine

## 2015-04-27 ENCOUNTER — Encounter: Payer: Self-pay | Admitting: Family Medicine

## 2015-04-27 VITALS — BP 110/82 | HR 78 | Temp 98.1°F | Resp 20 | Ht 70.75 in | Wt 246.0 lb

## 2015-04-27 DIAGNOSIS — L259 Unspecified contact dermatitis, unspecified cause: Secondary | ICD-10-CM | POA: Diagnosis not present

## 2015-04-27 MED ORDER — PREDNISONE 20 MG PO TABS: ORAL_TABLET | ORAL | Status: DC

## 2015-04-27 NOTE — Progress Notes (Signed)
Subjective:    Patient ID: Brad Taylor, male    DOB: [birth year], 64 y.o.   MRN: [id number]  HPI Patient has a rash on both forearms. It occurred after working outside in his yard a week ago. The rash consists of a cluster of erythematous papules that are extremely itchy. They have coalesced into a plaque on the dorsum of his left forearm. The plaque is in a linear grouping. He is tried triamcinolone cream without any relief. He is tried hydrocortisone cream without relief. There is no rash anywhere else on his body. There is no rash on his legs on his abdomen. It is only on the exposed surfaces of his forearms. Patient is an insulin-dependent diabetic but his blood sugars are extremely well controlled. His fasting blood sugar this morning was 90. We had a long discussion about using oral stairways given his failure with topical cortisone creams. He would like to proceed with oral steroid-dependent and monitor his blood sugar more closely. Past Medical History  Diagnosis Date  . COPD (chronic obstructive pulmonary disease)   . Diabetes mellitus without complication   . Hypothyroidism   . GERD (gastroesophageal reflux disease)   . Ulcer   . Post traumatic stress disorder   . Depression   . Hyperlipidemia   . AAA (abdominal aortic aneurysm) 5/15    3.5x3.5 cm  . Thyroid disease    Past Surgical History  Procedure Laterality Date  . Cholecystectomy    . Cataract extraction    . Knee surgery     Current Outpatient Prescriptions on File Prior to Visit  Medication Sig Dispense Refill  . atorvastatin (LIPITOR) 10 MG tablet Take 10 mg by mouth daily. Takes 5 mg    . buPROPion (WELLBUTRIN SR) 150 MG 12 hr tablet Take 1 tablet (150 mg total) by mouth daily. 30 tablet 1  . busPIRone (BUSPAR) 10 MG tablet Take 0.5 tablets (5 mg total) by mouth 2 (two) times daily as needed (for anxiety). 60 tablet 0  . clopidogrel (PLAVIX) 75 MG tablet Take 1 tablet (75 mg total) by mouth daily with  breakfast. 90 tablet 3  . insulin glargine (LANTUS) 100 UNIT/ML injection 40 units every a.m. & 50 units qhs    . levothyroxine (SYNTHROID, LEVOTHROID) 125 MCG tablet Take 1 tablet (125 mcg total) by mouth daily before breakfast. 30 tablet 1  . ondansetron (ZOFRAN) 4 MG tablet Take 1 tablet (4 mg total) by mouth every 8 (eight) hours as needed for nausea or vomiting. 20 tablet 0  . RABEprazole (ACIPHEX) 20 MG tablet Take 2 tablets (40 mg total) by mouth daily before breakfast. 30 tablet 1  . traZODone (DESYREL) 100 MG tablet Take 3 tablets (300 mg total) by mouth at bedtime. 30 tablet 0  . venlafaxine XR (EFFEXOR-XR) 150 MG 24 hr capsule Take 1 capsule (150 mg total) by mouth daily with breakfast. 30 capsule 1   No current facility-administered medications on file prior to visit.   No Known Allergies Social History   Social History  . Marital Status: Married    Spouse Name: Diane  . Number of Children: 1  . Years of Education: HS   Occupational History  . Retired    Social History Main Topics  . Smoking status: Former Smoker -- 1.00 packs/day for 30 years    Types: Cigarettes  . Smokeless tobacco: Former Systems developer    Quit date: 05/04/2013  . Alcohol Use: No     Comment: Former  heavy drinker, been sober for 10 years  . Drug Use: No  . Sexual Activity: Not on file   Other Topics Concern  . Not on file   Social History Narrative   Patient lives at home with spouse.   Caffeine Use: 3-4 cups daily   Retired Dealer.       Review of Systems  All other systems reviewed and are negative.      Objective:   Physical Exam  Cardiovascular: Normal rate, regular rhythm and normal heart sounds.   Pulmonary/Chest: Effort normal and breath sounds normal.  Skin: Rash noted. There is erythema.  Vitals reviewed.         Assessment & Plan:  Contact dermatitis - Plan: predniSONE (DELTASONE) 20 MG tablet  begin a prednisone taper pack. Resume to be a contact dermatitis from a  plant. Monitor blood sugars more closely. Call me if sugars rise above 250. As long as they're less than 250 I would give it time and they should subside on their I know the next 6-7 days once he is off the prednisone.

## 2015-06-15 ENCOUNTER — Ambulatory Visit (INDEPENDENT_AMBULATORY_CARE_PROVIDER_SITE_OTHER): Payer: Commercial Managed Care - HMO | Admitting: Family Medicine

## 2015-06-15 ENCOUNTER — Encounter: Payer: Self-pay | Admitting: Family Medicine

## 2015-06-15 VITALS — BP 110/74 | HR 76 | Temp 98.0°F | Resp 16 | Ht 70.75 in | Wt 250.0 lb

## 2015-06-15 DIAGNOSIS — D492 Neoplasm of unspecified behavior of bone, soft tissue, and skin: Secondary | ICD-10-CM

## 2015-06-15 DIAGNOSIS — D485 Neoplasm of uncertain behavior of skin: Secondary | ICD-10-CM

## 2015-06-15 NOTE — Progress Notes (Signed)
Subjective:    Patient ID: Brad Taylor, male    DOB: [birth year], 64 y.o.   MRN: [id number]  HPI Patient has 2 spots on his skin that he is worried about. There is one on the left cheek is approximately 5 mm in diameter. It is a brown warty papule that appears to be a Seborrheic keratosis.  He also has a 3 mm lesion on his upper right chest just below his clavicle. He is requesting treatment for both of these. Past Medical History  Diagnosis Date  . COPD (chronic obstructive pulmonary disease)   . Diabetes mellitus without complication   . Hypothyroidism   . GERD (gastroesophageal reflux disease)   . Ulcer   . Post traumatic stress disorder   . Depression   . Hyperlipidemia   . AAA (abdominal aortic aneurysm) 5/15    3.5x3.5 cm  . Thyroid disease    Past Surgical History  Procedure Laterality Date  . Cholecystectomy    . Cataract extraction    . Knee surgery     Current Outpatient Prescriptions on File Prior to Visit  Medication Sig Dispense Refill  . atorvastatin (LIPITOR) 10 MG tablet Take 10 mg by mouth daily. Takes 5 mg    . buPROPion (WELLBUTRIN SR) 150 MG 12 hr tablet Take 1 tablet (150 mg total) by mouth daily. 30 tablet 1  . busPIRone (BUSPAR) 10 MG tablet Take 0.5 tablets (5 mg total) by mouth 2 (two) times daily as needed (for anxiety). 60 tablet 0  . clopidogrel (PLAVIX) 75 MG tablet Take 1 tablet (75 mg total) by mouth daily with breakfast. 90 tablet 3  . insulin glargine (LANTUS) 100 UNIT/ML injection 40 units every a.m. & 50 units qhs    . levothyroxine (SYNTHROID, LEVOTHROID) 125 MCG tablet Take 1 tablet (125 mcg total) by mouth daily before breakfast. 30 tablet 1  . ondansetron (ZOFRAN) 4 MG tablet Take 1 tablet (4 mg total) by mouth every 8 (eight) hours as needed for nausea or vomiting. 20 tablet 0  . predniSONE (DELTASONE) 20 MG tablet 3 tabs poqday 1-2, 2 tabs poqday 3-4, 1 tab poqday 5-6 12 tablet 0  . RABEprazole (ACIPHEX) 20 MG tablet Take 2 tablets (40  mg total) by mouth daily before breakfast. 30 tablet 1  . traZODone (DESYREL) 100 MG tablet Take 3 tablets (300 mg total) by mouth at bedtime. 30 tablet 0  . venlafaxine XR (EFFEXOR-XR) 150 MG 24 hr capsule Take 1 capsule (150 mg total) by mouth daily with breakfast. 30 capsule 1   No current facility-administered medications on file prior to visit.   No Known Allergies Social History   Social History  . Marital Status: Married    Spouse Name: Diane  . Number of Children: 1  . Years of Education: HS   Occupational History  . Retired    Social History Main Topics  . Smoking status: Former Smoker -- 1.00 packs/day for 30 years    Types: Cigarettes  . Smokeless tobacco: Former Systems developer    Quit date: 05/04/2013  . Alcohol Use: No     Comment: Former heavy drinker, been sober for 10 years  . Drug Use: No  . Sexual Activity: Not on file   Other Topics Concern  . Not on file   Social History Narrative   Patient lives at home with spouse.   Caffeine Use: 3-4 cups daily   Retired Dealer.   Social History   Social History  .  Marital Status: Married    Spouse Name: Diane  . Number of Children: 1  . Years of Education: HS   Occupational History  . Retired    Social History Main Topics  . Smoking status: Former Smoker -- 1.00 packs/day for 30 years    Types: Cigarettes  . Smokeless tobacco: Former Systems developer    Quit date: 05/04/2013  . Alcohol Use: No     Comment: Former heavy drinker, been sober for 10 years  . Drug Use: No  . Sexual Activity: Not on file   Other Topics Concern  . Not on file   Social History Narrative   Patient lives at home with spouse.   Caffeine Use: 3-4 cups daily   Retired Dealer.     Review of Systems  All other systems reviewed and are negative.      Objective:   Physical Exam  Cardiovascular: Normal rate, regular rhythm and normal heart sounds.   Pulmonary/Chest: Effort normal and breath sounds normal.  Vitals reviewed.           Assessment & Plan:  Neoplasm of skin of face  I gave the option of excision versus cryotherapy. To try to reduce the risk of scarring, the patient chose cryotherapy. Using liquid nitrogen, each lesion was treated for approximately 30 seconds using liquid nitrogen cryotherapy. Wound care was discussed. If the lesion persist, he would need excision

## 2015-07-25 ENCOUNTER — Encounter: Payer: Self-pay | Admitting: *Deleted

## 2015-07-25 DIAGNOSIS — IMO0002 Reserved for concepts with insufficient information to code with codable children: Secondary | ICD-10-CM | POA: Insufficient documentation

## 2015-07-25 DIAGNOSIS — E1140 Type 2 diabetes mellitus with diabetic neuropathy, unspecified: Secondary | ICD-10-CM | POA: Insufficient documentation

## 2015-07-25 DIAGNOSIS — Z794 Long term (current) use of insulin: Secondary | ICD-10-CM

## 2015-07-25 DIAGNOSIS — E1165 Type 2 diabetes mellitus with hyperglycemia: Secondary | ICD-10-CM

## 2015-07-25 DIAGNOSIS — I69959 Hemiplegia and hemiparesis following unspecified cerebrovascular disease affecting unspecified side: Secondary | ICD-10-CM | POA: Insufficient documentation

## 2015-11-30 ENCOUNTER — Encounter: Payer: Self-pay | Admitting: Family Medicine

## 2015-11-30 ENCOUNTER — Ambulatory Visit (INDEPENDENT_AMBULATORY_CARE_PROVIDER_SITE_OTHER): Payer: Commercial Managed Care - HMO | Admitting: Family Medicine

## 2015-11-30 VITALS — BP 128/76 | HR 78 | Temp 97.5°F | Resp 18 | Ht 70.75 in | Wt 248.0 lb

## 2015-11-30 DIAGNOSIS — L0292 Furuncle, unspecified: Secondary | ICD-10-CM

## 2015-11-30 MED ORDER — SULFAMETHOXAZOLE-TRIMETHOPRIM 800-160 MG PO TABS: 1.0000 | ORAL_TABLET | Freq: Two times a day (BID) | ORAL | Status: DC

## 2015-11-30 NOTE — Progress Notes (Signed)
Subjective:    Patient ID: Brad Taylor, male    DOB: [birth year], 65 y.o.   MRN: [id number]  HPI Patient has a large boil on his right gluteus near the crease. Is approximately 3 cm in diameter with surrounding erythema the size of the palm of my hand.  It is extremely tender to the touch and warm to the touch. Past Medical History  Diagnosis Date  . COPD (chronic obstructive pulmonary disease) ([place])   . Diabetes mellitus without complication ([place])   . Hypothyroidism   . GERD (gastroesophageal reflux disease)   . Ulcer   . Post traumatic stress disorder   . Depression   . Hyperlipidemia   . AAA (abdominal aortic aneurysm) ([place]) 5/15    3.5x3.5 cm  . Thyroid disease    Past Surgical History  Procedure Laterality Date  . Cholecystectomy    . Cataract extraction    . Knee surgery     Current Outpatient Prescriptions on File Prior to Visit  Medication Sig Dispense Refill  . atorvastatin (LIPITOR) 10 MG tablet Take 10 mg by mouth daily. Takes 5 mg    . buPROPion (WELLBUTRIN SR) 150 MG 12 hr tablet Take 1 tablet (150 mg total) by mouth daily. 30 tablet 1  . busPIRone (BUSPAR) 10 MG tablet Take 0.5 tablets (5 mg total) by mouth 2 (two) times daily as needed (for anxiety). 60 tablet 0  . clopidogrel (PLAVIX) 75 MG tablet Take 1 tablet (75 mg total) by mouth daily with breakfast. 90 tablet 3  . insulin glargine (LANTUS) 100 UNIT/ML injection 40 units every a.m. & 50 units qhs    . levothyroxine (SYNTHROID, LEVOTHROID) 125 MCG tablet Take 1 tablet (125 mcg total) by mouth daily before breakfast. 30 tablet 1  . ondansetron (ZOFRAN) 4 MG tablet Take 1 tablet (4 mg total) by mouth every 8 (eight) hours as needed for nausea or vomiting. 20 tablet 0  . RABEprazole (ACIPHEX) 20 MG tablet Take 2 tablets (40 mg total) by mouth daily before breakfast. 30 tablet 1  . traZODone (DESYREL) 100 MG tablet Take 3 tablets (300 mg total) by mouth at bedtime. 30 tablet 0  . venlafaxine XR (EFFEXOR-XR)  150 MG 24 hr capsule Take 1 capsule (150 mg total) by mouth daily with breakfast. 30 capsule 1   No current facility-administered medications on file prior to visit.   No Known Allergies Social History   Social History  . Marital Status: Married    Spouse Name: Diane  . Number of Children: 1  . Years of Education: HS   Occupational History  . Retired    Social History Main Topics  . Smoking status: Former Smoker -- 1.00 packs/day for 30 years    Types: Cigarettes  . Smokeless tobacco: Former Systems developer    Quit date: 05/04/2013  . Alcohol Use: No     Comment: Former heavy drinker, been sober for 10 years  . Drug Use: No  . Sexual Activity: Not on file   Other Topics Concern  . Not on file   Social History Narrative   Patient lives at home with spouse.   Caffeine Use: 3-4 cups daily   Retired Dealer.      Review of Systems  All other systems reviewed and are negative.      Objective:   Physical Exam  Cardiovascular: Normal rate, regular rhythm and normal heart sounds.   Pulmonary/Chest: Effort normal and breath sounds normal.  Skin: Skin is  warm. There is erythema.  Vitals reviewed.         Assessment & Plan:  Boil - Plan: sulfamethoxazole-trimethoprim (BACTRIM DS,SEPTRA DS) 800-160 MG tablet, Culture, routine-abscess  Area was anesthetized with 0.1% lidocaine. A 1 cm cruciate incision was made and copious pus was expressed from the wound cavity. The wound was then packed with quarter-inch iodoform gauze and wound care was discussed. Recheck in one week or sooner if worse. The patient is instructed to remove about a inch of gauze per day until all the gauze is removed. Begin Bactrim double strength tablets 1 by mouth twice a day for 10 days.

## 2015-12-03 LAB — CULTURE, ROUTINE-ABSCESS: GRAM STAIN: NONE SEEN

## 2015-12-04 ENCOUNTER — Encounter: Payer: Self-pay | Admitting: Family Medicine

## 2015-12-04 ENCOUNTER — Ambulatory Visit (INDEPENDENT_AMBULATORY_CARE_PROVIDER_SITE_OTHER): Payer: Commercial Managed Care - HMO | Admitting: Family Medicine

## 2015-12-04 VITALS — Temp 98.3°F | Ht 70.75 in | Wt 248.0 lb

## 2015-12-04 DIAGNOSIS — L0292 Furuncle, unspecified: Secondary | ICD-10-CM

## 2015-12-04 NOTE — Progress Notes (Signed)
Subjective:    Patient ID: Brad Taylor, male    DOB: [birth year], 65 y.o.   MRN: [id number]  HPI 11/30/15 Patient has a large boil on his right gluteus near the crease. Is approximately 3 cm in diameter with surrounding erythema the size of the palm of my hand.  It is extremely tender to the touch and warm to the touch.  At that time, my plan was: Area was anesthetized with 0.1% lidocaine. A 1 cm cruciate incision was made and copious pus was expressed from the wound cavity. The wound was then packed with quarter-inch iodoform gauze and wound care was discussed. Recheck in one week or sooner if worse. The patient is instructed to remove about a inch of gauze per day until all the gauze is removed. Begin Bactrim double strength tablets 1 by mouth twice a day for 10 days.  12/04/15 All the gauze came out last evening. There is no further purulent drainage. Incision has essentially closed. Erythema is fading. The skin in that area still firm although much better than his last office visit. The pain is much better. Culture has revealed few lactobacillus but was otherwise normal. Past Medical History  Diagnosis Date  . COPD (chronic obstructive pulmonary disease) ([place])   . Diabetes mellitus without complication ([place])   . Hypothyroidism   . GERD (gastroesophageal reflux disease)   . Ulcer   . Post traumatic stress disorder   . Depression   . Hyperlipidemia   . AAA (abdominal aortic aneurysm) ([place]) 5/15    3.5x3.5 cm  . Thyroid disease    Past Surgical History  Procedure Laterality Date  . Cholecystectomy    . Cataract extraction    . Knee surgery     Current Outpatient Prescriptions on File Prior to Visit  Medication Sig Dispense Refill  . atorvastatin (LIPITOR) 10 MG tablet Take 10 mg by mouth daily. Takes 5 mg    . buPROPion (WELLBUTRIN SR) 150 MG 12 hr tablet Take 1 tablet (150 mg total) by mouth daily. 30 tablet 1  . busPIRone (BUSPAR) 10 MG tablet Take 0.5 tablets (5 mg total) by  mouth 2 (two) times daily as needed (for anxiety). 60 tablet 0  . clopidogrel (PLAVIX) 75 MG tablet Take 1 tablet (75 mg total) by mouth daily with breakfast. 90 tablet 3  . insulin glargine (LANTUS) 100 UNIT/ML injection 40 units every a.m. & 50 units qhs    . levothyroxine (SYNTHROID, LEVOTHROID) 125 MCG tablet Take 1 tablet (125 mcg total) by mouth daily before breakfast. 30 tablet 1  . ondansetron (ZOFRAN) 4 MG tablet Take 1 tablet (4 mg total) by mouth every 8 (eight) hours as needed for nausea or vomiting. 20 tablet 0  . RABEprazole (ACIPHEX) 20 MG tablet Take 2 tablets (40 mg total) by mouth daily before breakfast. 30 tablet 1  . sulfamethoxazole-trimethoprim (BACTRIM DS,SEPTRA DS) 800-160 MG tablet Take 1 tablet by mouth 2 (two) times daily. 20 tablet 0  . traZODone (DESYREL) 100 MG tablet Take 3 tablets (300 mg total) by mouth at bedtime. 30 tablet 0  . venlafaxine XR (EFFEXOR-XR) 150 MG 24 hr capsule Take 1 capsule (150 mg total) by mouth daily with breakfast. 30 capsule 1   No current facility-administered medications on file prior to visit.   No Known Allergies Social History   Social History  . Marital Status: Married    Spouse Name: Diane  . Number of Children: 1  . Years of Education:  HS   Occupational History  . Retired    Social History Main Topics  . Smoking status: Former Smoker -- 1.00 packs/day for 30 years    Types: Cigarettes  . Smokeless tobacco: Former Systems developer    Quit date: 05/04/2013  . Alcohol Use: No     Comment: Former heavy drinker, been sober for 10 years  . Drug Use: No  . Sexual Activity: Not on file   Other Topics Concern  . Not on file   Social History Narrative   Patient lives at home with spouse.   Caffeine Use: 3-4 cups daily   Retired Dealer.      Review of Systems  All other systems reviewed and are negative.      Objective:   Physical Exam  Cardiovascular: Normal rate, regular rhythm and normal heart sounds.     Pulmonary/Chest: Effort normal and breath sounds normal.  Skin: Skin is warm. There is erythema.  Vitals reviewed.         Assessment & Plan:  Boil- Appears to be healing well. Complete Bactrim. Follow-up as necessary.

## 2015-12-25 ENCOUNTER — Telehealth: Payer: Self-pay | Admitting: Family Medicine

## 2015-12-25 NOTE — Telephone Encounter (Signed)
Patient is calling to say that the boil dr pickard lanced is not better, says he needs something called in immediately or he would find a new doctor    [facility] [place] [phone] (H)

## 2015-12-25 NOTE — Telephone Encounter (Signed)
Appt made for tom at 9:15 and pt's wife aware

## 2015-12-26 ENCOUNTER — Encounter: Payer: Self-pay | Admitting: Family Medicine

## 2015-12-26 ENCOUNTER — Ambulatory Visit (INDEPENDENT_AMBULATORY_CARE_PROVIDER_SITE_OTHER): Payer: Commercial Managed Care - HMO | Admitting: Family Medicine

## 2015-12-26 VITALS — BP 126/70 | HR 60 | Temp 98.3°F | Resp 16 | Ht 70.5 in | Wt 248.0 lb

## 2015-12-26 DIAGNOSIS — L723 Sebaceous cyst: Secondary | ICD-10-CM | POA: Diagnosis not present

## 2015-12-26 DIAGNOSIS — L089 Local infection of the skin and subcutaneous tissue, unspecified: Secondary | ICD-10-CM

## 2015-12-26 DIAGNOSIS — L0292 Furuncle, unspecified: Secondary | ICD-10-CM | POA: Diagnosis not present

## 2015-12-26 MED ORDER — SULFAMETHOXAZOLE-TRIMETHOPRIM 800-160 MG PO TABS: 1.0000 | ORAL_TABLET | Freq: Two times a day (BID) | ORAL | Status: DC

## 2015-12-26 NOTE — Progress Notes (Signed)
Subjective:    Patient ID: Brad Taylor, male    DOB: Apr 05, 1951, 65 y.o.   MRN: [id number]  HPI 11/30/15 Patient has a large boil on his right gluteus near the crease. Is approximately 3 cm in diameter with surrounding erythema the size of the palm of my hand.  It is extremely tender to the touch and warm to the touch.  At that time, my plan was: Area was anesthetized with 0.1% lidocaine. A 1 cm cruciate incision was made and copious pus was expressed from the wound cavity. The wound was then packed with quarter-inch iodoform gauze and wound care was discussed. Recheck in one week or sooner if worse. The patient is instructed to remove about a inch of gauze per day until all the gauze is removed. Begin Bactrim double strength tablets 1 by mouth twice a day for 10 days.  12/04/15 All the gauze came out last evening. There is no further purulent drainage. Incision has essentially closed. Erythema is fading. The skin in that area still firm although much better than his last office visit. The pain is much better. Culture has revealed few lactobacillus but was otherwise normal.  At that time, my plan was: Appears to be healing well. Complete Bactrim. Follow-up as necessary.  12/26/15 "Boil" return Friday. The area is now extremely red hot swollen and painful. It is approximately 4 cm in diameter. The area was anesthetized with 0.1% lidocaine. A 1.5 cm vertical incision was made. Copious foul-smelling discharge was expressed at least 20 mL. The abscess was then packed with more than 12 inches of quarter-inch iodoform gauze. Past Medical History  Diagnosis Date  . COPD (chronic obstructive pulmonary disease) ([place])   . Diabetes mellitus without complication ([place])   . Hypothyroidism   . GERD (gastroesophageal reflux disease)   . Ulcer   . Post traumatic stress disorder   . Depression   . Hyperlipidemia   . AAA (abdominal aortic aneurysm) ([place]) 5/15    3.5x3.5 cm  . Thyroid disease    Past  Surgical History  Procedure Laterality Date  . Cholecystectomy    . Cataract extraction    . Knee surgery     Current Outpatient Prescriptions on File Prior to Visit  Medication Sig Dispense Refill  . atorvastatin (LIPITOR) 10 MG tablet Take 10 mg by mouth daily. Takes 5 mg    . buPROPion (WELLBUTRIN SR) 150 MG 12 hr tablet Take 1 tablet (150 mg total) by mouth daily. 30 tablet 1  . busPIRone (BUSPAR) 10 MG tablet Take 0.5 tablets (5 mg total) by mouth 2 (two) times daily as needed (for anxiety). 60 tablet 0  . clopidogrel (PLAVIX) 75 MG tablet Take 1 tablet (75 mg total) by mouth daily with breakfast. 90 tablet 3  . insulin glargine (LANTUS) 100 UNIT/ML injection 40 units every a.m. & 50 units qhs    . levothyroxine (SYNTHROID, LEVOTHROID) 125 MCG tablet Take 1 tablet (125 mcg total) by mouth daily before breakfast. 30 tablet 1  . ondansetron (ZOFRAN) 4 MG tablet Take 1 tablet (4 mg total) by mouth every 8 (eight) hours as needed for nausea or vomiting. 20 tablet 0  . RABEprazole (ACIPHEX) 20 MG tablet Take 2 tablets (40 mg total) by mouth daily before breakfast. 30 tablet 1  . traZODone (DESYREL) 100 MG tablet Take 3 tablets (300 mg total) by mouth at bedtime. 30 tablet 0  . venlafaxine XR (EFFEXOR-XR) 150 MG 24 hr capsule Take 1 capsule (  150 mg total) by mouth daily with breakfast. 30 capsule 1   No current facility-administered medications on file prior to visit.   No Known Allergies Social History   Social History  . Marital Status: Married    Spouse Name: Diane  . Number of Children: 1  . Years of Education: HS   Occupational History  . Retired    Social History Main Topics  . Smoking status: Former Smoker -- 1.00 packs/day for 30 years    Types: Cigarettes  . Smokeless tobacco: Former Systems developer    Quit date: 05/04/2013  . Alcohol Use: No     Comment: Former heavy drinker, been sober for 10 years  . Drug Use: No  . Sexual Activity: Not on file   Other Topics Concern  .  Not on file   Social History Narrative   Patient lives at home with spouse.   Caffeine Use: 3-4 cups daily   Retired Dealer.      Review of Systems  All other systems reviewed and are negative.      Objective:   Physical Exam  Cardiovascular: Normal rate, regular rhythm and normal heart sounds.   Pulmonary/Chest: Effort normal and breath sounds normal.  Skin: Skin is warm. There is erythema.  Vitals reviewed.         Assessment & Plan:  Boil - Plan: sulfamethoxazole-trimethoprim (BACTRIM DS,SEPTRA DS) 800-160 MG tablet  Infected sebaceous cyst - Plan: Ambulatory referral to General Surgery  Given the appearance, the recurrence, and the smell, I believe this is an infected sebaceous cyst. Therefore, consult general surgery. The area probably needs operative excision and closure to prevent recurrences. It is just too painful to even attempt to do in the office and to inflamed at the present time.  I anticipate significant improvement in his pain having drained the lesion today. I will start him on antibiotics to cover infection but I will consult surgery as I believe this can become recurrent without more thorough operative debridement of the cyst cavity

## 2015-12-28 ENCOUNTER — Ambulatory Visit: Payer: Commercial Managed Care - HMO | Admitting: Family Medicine

## 2016-01-11 ENCOUNTER — Telehealth: Payer: Self-pay | Admitting: Family Medicine

## 2016-01-11 NOTE — Telephone Encounter (Signed)
Received fax from [facility] back Auth# [id number] Treating Provider  [facility] # of Visits 6 Start Date 01/10/2016 End Date 07/10/2016 CPT 99499 DX L72.3

## 2016-01-23 DIAGNOSIS — L723 Sebaceous cyst: Secondary | ICD-10-CM | POA: Diagnosis not present

## 2016-04-29 ENCOUNTER — Emergency Department (HOSPITAL_COMMUNITY)
Admission: EM | Admit: 2016-04-29 | Discharge: 2016-04-29 | Disposition: A | Discharge status: Home / Self Care | Payer: Commercial Managed Care - HMO | Attending: Emergency Medicine | Admitting: Emergency Medicine

## 2016-04-29 ENCOUNTER — Encounter (HOSPITAL_COMMUNITY): Payer: Self-pay | Admitting: Emergency Medicine

## 2016-04-29 DIAGNOSIS — W228XXA Striking against or struck by other objects, initial encounter: Secondary | ICD-10-CM | POA: Diagnosis not present

## 2016-04-29 DIAGNOSIS — E039 Hypothyroidism, unspecified: Secondary | ICD-10-CM | POA: Diagnosis not present

## 2016-04-29 DIAGNOSIS — Y939 Activity, unspecified: Secondary | ICD-10-CM | POA: Insufficient documentation

## 2016-04-29 DIAGNOSIS — E119 Type 2 diabetes mellitus without complications: Secondary | ICD-10-CM | POA: Insufficient documentation

## 2016-04-29 DIAGNOSIS — J449 Chronic obstructive pulmonary disease, unspecified: Secondary | ICD-10-CM | POA: Diagnosis not present

## 2016-04-29 DIAGNOSIS — Z79899 Other long term (current) drug therapy: Secondary | ICD-10-CM | POA: Insufficient documentation

## 2016-04-29 DIAGNOSIS — Z8673 Personal history of transient ischemic attack (TIA), and cerebral infarction without residual deficits: Secondary | ICD-10-CM | POA: Diagnosis not present

## 2016-04-29 DIAGNOSIS — Y929 Unspecified place or not applicable: Secondary | ICD-10-CM | POA: Insufficient documentation

## 2016-04-29 DIAGNOSIS — Z794 Long term (current) use of insulin: Secondary | ICD-10-CM | POA: Diagnosis not present

## 2016-04-29 DIAGNOSIS — S81811A Laceration without foreign body, right lower leg, initial encounter: Secondary | ICD-10-CM | POA: Insufficient documentation

## 2016-04-29 DIAGNOSIS — S8991XA Unspecified injury of right lower leg, initial encounter: Secondary | ICD-10-CM | POA: Diagnosis present

## 2016-04-29 DIAGNOSIS — Y999 Unspecified external cause status: Secondary | ICD-10-CM | POA: Diagnosis not present

## 2016-04-29 HISTORY — DX: Cerebral infarction, unspecified: I63.9

## 2016-04-29 MED ORDER — LIDOCAINE VISCOUS 2 % MT SOLN
OROMUCOSAL | Status: AC
  Filled 2016-04-29: qty 15

## 2016-04-29 MED ORDER — LIDOCAINE HCL (PF) 2 % IJ SOLN
10.0000 mL | Freq: Once | INTRAMUSCULAR | Status: DC
  Filled 2016-04-29: qty 10

## 2016-04-29 MED ORDER — POVIDONE-IODINE 10 % EX SOLN
CUTANEOUS | Status: AC
  Filled 2016-04-29: qty 118

## 2016-04-29 MED ORDER — DIPHTH-ACELL PERTUSSIS-TETANUS 25-58-10 LF-MCG/0.5 IM SUSP
0.5000 mL | Freq: Once | INTRAMUSCULAR | Status: AC
  Administered 2016-04-29: 0.5 mL via INTRAMUSCULAR
  Filled 2016-04-29: qty 0.5

## 2016-04-29 NOTE — ED Triage Notes (Signed)
Patient states he hit his right lower leg on a trailer hitch prior to arrival to ER. States he is on plavix. Laceration noted to right lower leg. Bleeding controlled at this time.

## 2016-04-29 NOTE — Discharge Instructions (Signed)
Keep the wound clean with mild soap and water and keep it bandaged.  Elevate your leg tonight.  Sutures out in 10 days.  Return here for any signs of infection

## 2016-04-29 NOTE — ED Provider Notes (Signed)
[facility] DEPT Provider Note   CSN: [id number] Arrival date & time: 04/29/16  1230  By signing my name below, I, Brad Taylor, attest that this documentation has been prepared under the direction and in the presence of [name], PA-C Electronically Signed: [name], ED Scribe. 04/29/16. 2:57 PM.   History   Chief Complaint Chief Complaint  Patient presents with  . Extremity Laceration    HPI Brad Taylor is a 65 y.o. male with a PMHx of stroke, COPD, DM, hyperlipidemia, who presents to the Emergency Department complaining of an right lower leg laceration onset PTA. Pt notes that he struck his right lower leg on a trailer hitch and cut his right lower leg. Pt denies falling during the incident. Pt reports that he is currently taking plavix. Pt notes that he is not UTD with his tetanus vaccination. He states that he has tried applying pressure with a bandage for the relief of his symptoms. He denies color change, joint swelling, right leg pain, gait problem, and numbness   The history is provided by the patient. No language interpreter was used.  Laceration   The incident occurred 1 to 2 hours ago. The laceration is located on the right leg. 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 The pain is mild. The pain has been constant since onset. He reports no foreign bodies present.    Past Medical History:  Diagnosis Date  . AAA (abdominal aortic aneurysm) ([place]) 5/15   3.5x3.5 cm  . COPD (chronic obstructive pulmonary disease) ([place])   . Depression   . Diabetes mellitus without complication ([place])   . GERD (gastroesophageal reflux disease)   . Hyperlipidemia   . Hypothyroidism   . Post traumatic stress disorder   . Stroke ([place])   . Thyroid disease   . Ulcer     Patient Active Problem List   Diagnosis Date Noted  . Type 2 diabetes mellitus, uncontrolled ([place]) 07/25/2015  . Hemiplegia and hemiparesis following unspecified cerebrovascular disease  affecting unspecified side ([place]) 07/25/2015  . Cellulitis, gluteal, left 01/19/2014  . Tobacco abuse 01/19/2014  . Aphasia as late effect of stroke 11/09/2013  . CVA (cerebral infarction) 09/20/2013  . Stroke [facility]) 09/15/2013    Past Surgical History:  Procedure Laterality Date  . CATARACT EXTRACTION    . CHOLECYSTECTOMY    . KNEE SURGERY         Home Medications    Prior to Admission medications   Medication Sig Start Date End Date Taking? Authorizing Provider  atorvastatin (LIPITOR) 10 MG tablet Take 10 mg by mouth daily. Takes 5 mg    Historical Provider, MD  buPROPion (WELLBUTRIN SR) 150 MG 12 hr tablet Take 1 tablet (150 mg total) by mouth daily. 10/08/13   [name], PA-C  busPIRone (BUSPAR) 10 MG tablet Take 0.5 tablets (5 mg total) by mouth 2 (two) times daily as needed (for anxiety). 10/08/13   [name], PA-C  clopidogrel (PLAVIX) 75 MG tablet Take 1 tablet (75 mg total) by mouth daily with breakfast. 02/01/14   [name], NP  insulin glargine (LANTUS) 100 UNIT/ML injection 40 units every a.m. & 50 units qhs 10/08/13   [name], PA-C  levothyroxine (SYNTHROID, LEVOTHROID) 125 MCG tablet Take 1 tablet (125 mcg total) by mouth daily before breakfast. 10/08/13   [name], PA-C  ondansetron (ZOFRAN) 4 MG tablet Take 1 tablet (4 mg total) by mouth every 8 (eight) hours  as needed for nausea or vomiting. 11/08/14   [name], MD  RABEprazole (ACIPHEX) 20 MG tablet Take 2 tablets (40 mg total) by mouth daily before breakfast. 10/08/13   [name], PA-C  sulfamethoxazole-trimethoprim (BACTRIM DS,SEPTRA DS) 800-160 MG tablet Take 1 tablet by mouth 2 (two) times daily. 12/26/15   [name], MD  traZODone (DESYREL) 100 MG tablet Take 3 tablets (300 mg total) by mouth at bedtime. 10/08/13   [name], PA-C  venlafaxine XR (EFFEXOR-XR) 150 MG 24 hr capsule Take 1 capsule (150 mg total) by mouth daily with breakfast. 10/08/13   [name], PA-C    Family History Family History  Problem Relation Age of Onset  . Emphysema Father   . Thyroid disease Sister   . Stroke Maternal Grandmother   . Thyroid disease Sister   . Thyroid disease Sister     Social History Social History  Substance Use Topics  . Smoking status: Current Every Day Smoker    Packs/day: 1.00    Years: 30.00    Types: Cigarettes  . Smokeless tobacco: Former Systems developer    Quit date: 05/04/2013  . Alcohol use No     Comment: Former heavy drinker, been sober for 10 years     Allergies   Review of patient's allergies indicates no known allergies.   Review of Systems Review of Systems  Constitutional: Negative for chills and fever.  Musculoskeletal: Negative for arthralgias, back pain, gait problem and joint swelling.  Skin: Positive for wound (laceration to right lower leg). Negative for color change.       Laceration   Neurological: Negative for dizziness, weakness and numbness.  Hematological: Does not bruise/bleed easily.  All other systems reviewed and are negative.    Physical Exam Updated Vital Signs BP 148/74 (BP Location: Left Arm)   Pulse (!) 58   Temp 98.8 F (37.1 C) (Oral)   Resp 18   Ht 6' (1.829 m)   Wt 246 lb (111.6 kg)   SpO2 97%   BMI 33.36 kg/m   Physical Exam  Constitutional: He is oriented to person, place, and time. He appears well-developed and well-nourished. No distress.  HENT:  Head: Normocephalic and atraumatic.  Eyes: EOM are normal.  Neck: Neck supple.  Cardiovascular: Normal rate, regular rhythm and normal heart sounds.  Exam reveals no gallop and no friction rub.   No murmur heard. Pulmonary/Chest: Effort normal and breath sounds normal. No respiratory distress. He has no wheezes. He has no rales.  Abdominal: He exhibits no distension.  Musculoskeletal: Normal range of motion.  Neurological: He is alert and oriented to person, place, and time.  Skin: Skin is warm and dry.  5 cm flap to the  lateral aspect of the lower right leg. Bleeding controlled.   Psychiatric: He has a normal mood and affect. His behavior is normal.  Nursing note and vitals reviewed.    ED Treatments / Results  DIAGNOSTIC STUDIES: Oxygen Saturation is 97% on RA, nl by my interpretation.    COORDINATION OF CARE: 2:47 PM Discussed treatment plan with pt at bedside which includes laceration repair, update tetanus vaccination, and pt agreed to plan.    Procedures .Marland KitchenLaceration Repair Date/Time: 04/29/2016 3:46 PM Performed by: [name] Authorized by: [name]   Consent:    Consent obtained:  Verbal   Consent given by:  Patient   Risks discussed:  Need for additional repair   Alternatives discussed:  Delayed  treatment Anesthesia (see MAR for exact dosages):    Anesthesia method:  Topical application and local infiltration   Topical anesthesia: viscous lidocaine, 3 mL.   Local anesthetic:  Lidocaine 2% w/o epi (2 mL) Laceration details:    Location:  Leg   Leg location:  R lower leg   Length (cm):  5 Repair type:    Repair type:  Intermediate Pre-procedure details:    Preparation:  Patient was prepped and draped in usual sterile fashion Exploration:    Hemostasis achieved with:  Direct pressure   Wound exploration: wound explored through full range of motion and entire depth of wound probed and visualized     Wound extent: no foreign bodies/material noted     Contaminated: no   Treatment:    Area cleansed with:  Betadine   Amount of cleaning:  Standard   Irrigation solution:  Sterile saline   Irrigation method:  Syringe   Visualized foreign bodies/material removed: no   Skin repair:    Repair method:  Sutures   Suture size:  3-0   Suture material:  Prolene   Number of sutures:  6 Approximation:    Approximation:  Loose Post-procedure details:    Dressing:  Adhesive bandage and sterile dressing   Patient tolerance of procedure:  Tolerated well, no immediate  complications      (including critical care time)  Medications Ordered in ED Medications - No data to display   Initial Impression / Assessment and Plan / ED Course  I have reviewed the triage vital signs and the nursing notes.    Clinical Course      Tetanus updated in the ED. Laceration occurred < 12 hours prior to repair. Discussed laceration care with pt and answered questions. Pt to f-u for suture removal in 10 days and wound check sooner should there be signs of dehiscence or infection. Pt is hemodynamically stable with no complaints prior to dc.    Final Clinical Impressions(s) / ED Diagnoses   Final diagnoses:  Leg laceration, right, initial encounter    New Prescriptions New Prescriptions   No medications on file    I personally performed the services described in this documentation, which was scribed in my presence. The recorded information has been reviewed and is accurate.     [name], PA-C 05/01/16 2112    Fredia Sorrow, MD 05/04/16 1450

## 2016-05-09 ENCOUNTER — Ambulatory Visit (INDEPENDENT_AMBULATORY_CARE_PROVIDER_SITE_OTHER): Payer: Commercial Managed Care - HMO | Admitting: Family Medicine

## 2016-05-09 VITALS — BP 132/74 | HR 68 | Temp 97.9°F | Resp 18 | Ht 70.75 in | Wt 255.0 lb

## 2016-05-09 DIAGNOSIS — L03115 Cellulitis of right lower limb: Secondary | ICD-10-CM

## 2016-05-09 DIAGNOSIS — Z4802 Encounter for removal of sutures: Secondary | ICD-10-CM

## 2016-05-09 MED ORDER — SULFAMETHOXAZOLE-TRIMETHOPRIM 800-160 MG PO TABS: 1.0000 | ORAL_TABLET | Freq: Two times a day (BID) | ORAL | 0 refills | Status: DC

## 2016-05-09 NOTE — Progress Notes (Signed)
Subjective:    Patient ID: Brad Taylor, male    DOB: 03-25-1951, 65 y.o.   MRN: [id number]  HPI   September 25, the patient suffered a laceration to his right lateral shin. He struck his leg on a trailer hitch. He suffered a deep skin tear. Patient went to the emergency room where the laceration was closed with 4-0 Ethilon sutures.  It appears as though there are 6 sutures. However the patient suffers from chronic venous insufficiency and severe edema in his legs. As a result of the laceration, the patient has been unable to wear his compression stockings. Therefore he now has +2 pitting edema in both legs. This has caused the laceration to separate. As result the surrounding skin has become infected. It is now hot erythematous and tender to the touch. The laceration is 5.5 cm x 4 cm in the shape of a D. I removed the 6 sutures. Past Medical History:  Diagnosis Date  . AAA (abdominal aortic aneurysm) ([place]) 5/15   3.5x3.5 cm  . COPD (chronic obstructive pulmonary disease) ([place])   . Depression   . Diabetes mellitus without complication ([place])   . GERD (gastroesophageal reflux disease)   . Hyperlipidemia   . Hypothyroidism   . Post traumatic stress disorder   . Stroke ([place])   . Thyroid disease   . Ulcer [facility])    Past Surgical History:  Procedure Laterality Date  . CATARACT EXTRACTION    . CHOLECYSTECTOMY    . KNEE SURGERY     Current Outpatient Prescriptions on File Prior to Visit  Medication Sig Dispense Refill  . atorvastatin (LIPITOR) 10 MG tablet Take 10 mg by mouth daily. Takes 5 mg    . buPROPion (WELLBUTRIN SR) 150 MG 12 hr tablet Take 1 tablet (150 mg total) by mouth daily. 30 tablet 1  . busPIRone (BUSPAR) 10 MG tablet Take 0.5 tablets (5 mg total) by mouth 2 (two) times daily as needed (for anxiety). 60 tablet 0  . clopidogrel (PLAVIX) 75 MG tablet Take 1 tablet (75 mg total) by mouth daily with breakfast. 90 tablet 3  . insulin glargine (LANTUS) 100 UNIT/ML injection 40  units every a.m. & 50 units qhs    . levothyroxine (SYNTHROID, LEVOTHROID) 125 MCG tablet Take 1 tablet (125 mcg total) by mouth daily before breakfast. 30 tablet 1  . ondansetron (ZOFRAN) 4 MG tablet Take 1 tablet (4 mg total) by mouth every 8 (eight) hours as needed for nausea or vomiting. 20 tablet 0  . RABEprazole (ACIPHEX) 20 MG tablet Take 2 tablets (40 mg total) by mouth daily before breakfast. 30 tablet 1  . sulfamethoxazole-trimethoprim (BACTRIM DS,SEPTRA DS) 800-160 MG tablet Take 1 tablet by mouth 2 (two) times daily. 20 tablet 0  . traZODone (DESYREL) 100 MG tablet Take 3 tablets (300 mg total) by mouth at bedtime. 30 tablet 0  . venlafaxine XR (EFFEXOR-XR) 150 MG 24 hr capsule Take 1 capsule (150 mg total) by mouth daily with breakfast. 30 capsule 1   No current facility-administered medications on file prior to visit.    No Known Allergies Social History   Social History  . Marital status: Married    Spouse name: Diane  . Number of children: 1  . Years of education: HS   Occupational History  . Retired    Social History Main Topics  . Smoking status: Current Every Day Smoker    Packs/day: 1.00    Years: 30.00  Types: Cigarettes  . Smokeless tobacco: Former Systems developer    Quit date: 05/04/2013  . Alcohol use No     Comment: Former heavy drinker, been sober for 10 years  . Drug use: No  . Sexual activity: Not on file   Other Topics Concern  . Not on file   Social History Narrative   Patient lives at home with spouse.   Caffeine Use: 3-4 cups daily   Retired Dealer.      Review of Systems  All other systems reviewed and are negative.      Objective:   Physical Exam  Cardiovascular: Normal rate, regular rhythm and normal heart sounds.   Pulmonary/Chest: Effort normal and breath sounds normal.  Musculoskeletal: He exhibits edema.  Skin: Skin is warm. No rash noted. There is erythema.  Vitals reviewed.    See hpi     Assessment & Plan:  Cellulitis  of leg, right - Plan: sulfamethoxazole-trimethoprim (BACTRIM DS,SEPTRA DS) 800-160 MG tablet  Visit for suture removal  Sutures are removed without difficulty. However the wound has become infected and has also dehisced due to the pitting edema in his right leg. I will start the patient on Bactrim double strength tablets 1 by mouth twice a day for 7 days given his history of staph infections to cover his cellulitis. To assist in the wound healing, I will treat this similar to a venous stasis ulcer because it is behaving similar to that. The patient was placed in an [employer]. The wound was cleaned and covered with Silvadene and a nonadherent gauze and then the Unna boot was applied to his knee. Recheck here on Monday to replace the dressing.  Continue dressing changes in a similar fashion until the wound has healed

## 2016-05-13 ENCOUNTER — Ambulatory Visit (INDEPENDENT_AMBULATORY_CARE_PROVIDER_SITE_OTHER): Payer: Commercial Managed Care - HMO | Admitting: Family Medicine

## 2016-05-13 ENCOUNTER — Encounter: Payer: Self-pay | Admitting: Family Medicine

## 2016-05-13 VITALS — BP 132/74 | HR 64 | Temp 98.4°F

## 2016-05-13 DIAGNOSIS — S81801D Unspecified open wound, right lower leg, subsequent encounter: Secondary | ICD-10-CM

## 2016-05-13 DIAGNOSIS — S81801S Unspecified open wound, right lower leg, sequela: Secondary | ICD-10-CM | POA: Diagnosis not present

## 2016-05-13 NOTE — Progress Notes (Signed)
Subjective:    Patient ID: Brad Taylor, male    DOB: 1950/08/27, 65 y.o.   MRN: [id number]  HPI  05/09/16 September 25, the patient suffered a laceration to his right lateral shin. He struck his leg on a trailer hitch. He suffered a deep skin tear. Patient went to the emergency room where the laceration was closed with 4-0 Ethilon sutures.  It appears as though there are 6 sutures. However the patient suffers from chronic venous insufficiency and severe edema in his legs. As a result of the laceration, the patient has been unable to wear his compression stockings. Therefore he now has +2 pitting edema in both legs. This has caused the laceration to separate. As result the surrounding skin has become infected. It is now hot erythematous and tender to the touch. The laceration is 5.5 cm x 4 cm in the shape of a D. I removed the 6 sutures.  At that time, my plan was: Sutures are removed without difficulty. However the wound has become infected and has also dehisced due to the pitting edema in his right leg. I will start the patient on Bactrim double strength tablets 1 by mouth twice a day for 7 days given his history of staph infections to cover his cellulitis. To assist in the wound healing, I will treat this similar to a venous stasis ulcer because it is behaving similar to that. The patient was placed in an [employer]. The wound was cleaned and covered with Silvadene and a nonadherent gauze and then the Unna boot was applied to his knee. Recheck here on Monday to replace the dressing.  Continue dressing changes in a similar fashion until the wound has healed  05/13/16 The swelling in his right leg is much better. The erythema has resolved. I removed his Haematologist today. The triangular-shaped skin tear has now been replaced with a V-shaped venous stasis ulcer that is still approximately 5 cm in length and 4 cm in width from the pedicle of the "V" to the top of the "V". Past Medical History:  Diagnosis  Date  . AAA (abdominal aortic aneurysm) ([place]) 5/15   3.5x3.5 cm  . COPD (chronic obstructive pulmonary disease) ([place])   . Depression   . Diabetes mellitus without complication ([place])   . GERD (gastroesophageal reflux disease)   . Hyperlipidemia   . Hypothyroidism   . Post traumatic stress disorder   . Stroke ([place])   . Thyroid disease   . Ulcer [facility])    Past Surgical History:  Procedure Laterality Date  . CATARACT EXTRACTION    . CHOLECYSTECTOMY    . KNEE SURGERY     Current Outpatient Prescriptions on File Prior to Visit  Medication Sig Dispense Refill  . atorvastatin (LIPITOR) 10 MG tablet Take 10 mg by mouth daily. Takes 5 mg    . buPROPion (WELLBUTRIN SR) 150 MG 12 hr tablet Take 1 tablet (150 mg total) by mouth daily. 30 tablet 1  . busPIRone (BUSPAR) 10 MG tablet Take 0.5 tablets (5 mg total) by mouth 2 (two) times daily as needed (for anxiety). 60 tablet 0  . clopidogrel (PLAVIX) 75 MG tablet Take 1 tablet (75 mg total) by mouth daily with breakfast. 90 tablet 3  . insulin glargine (LANTUS) 100 UNIT/ML injection 40 units every a.m. & 50 units qhs    . levothyroxine (SYNTHROID, LEVOTHROID) 125 MCG tablet Take 1 tablet (125 mcg total) by mouth daily before breakfast. 30 tablet 1  .  ondansetron (ZOFRAN) 4 MG tablet Take 1 tablet (4 mg total) by mouth every 8 (eight) hours as needed for nausea or vomiting. 20 tablet 0  . RABEprazole (ACIPHEX) 20 MG tablet Take 2 tablets (40 mg total) by mouth daily before breakfast. 30 tablet 1  . sulfamethoxazole-trimethoprim (BACTRIM DS,SEPTRA DS) 800-160 MG tablet Take 1 tablet by mouth 2 (two) times daily. 20 tablet 0  . sulfamethoxazole-trimethoprim (BACTRIM DS,SEPTRA DS) 800-160 MG tablet Take 1 tablet by mouth 2 (two) times daily. 14 tablet 0  . traZODone (DESYREL) 100 MG tablet Take 3 tablets (300 mg total) by mouth at bedtime. 30 tablet 0  . venlafaxine XR (EFFEXOR-XR) 150 MG 24 hr capsule Take 1 capsule (150 mg total) by mouth daily with  breakfast. 30 capsule 1   No current facility-administered medications on file prior to visit.    No Known Allergies Social History   Social History  . Marital status: Married    Spouse name: Diane  . Number of children: 1  . Years of education: HS   Occupational History  . Retired    Social History Main Topics  . Smoking status: Current Every Day Smoker    Packs/day: 1.00    Years: 30.00    Types: Cigarettes  . Smokeless tobacco: Former Systems developer    Quit date: 05/04/2013  . Alcohol use No     Comment: Former heavy drinker, been sober for 10 years  . Drug use: No  . Sexual activity: Not on file   Other Topics Concern  . Not on file   Social History Narrative   Patient lives at home with spouse.   Caffeine Use: 3-4 cups daily   Retired Dealer.      Review of Systems  All other systems reviewed and are negative.      Objective:   Physical Exam  Cardiovascular: Normal rate, regular rhythm and normal heart sounds.   Pulmonary/Chest: Effort normal and breath sounds normal.  Musculoskeletal: He exhibits edema.  Skin: Skin is warm. No rash noted. No erythema.  Vitals reviewed.    See hpi     Assessment & Plan:  Cellulitis has resolved. Now there is a "V" shaped ulcer/wound on the shin.  Unna boot was replaced and the wound was cleaned. Recheck on Friday

## 2016-05-17 ENCOUNTER — Ambulatory Visit (INDEPENDENT_AMBULATORY_CARE_PROVIDER_SITE_OTHER): Payer: Commercial Managed Care - HMO | Admitting: Family Medicine

## 2016-05-17 DIAGNOSIS — S81801S Unspecified open wound, right lower leg, sequela: Secondary | ICD-10-CM | POA: Diagnosis not present

## 2016-05-17 DIAGNOSIS — S81801D Unspecified open wound, right lower leg, subsequent encounter: Secondary | ICD-10-CM

## 2016-05-17 DIAGNOSIS — Z23 Encounter for immunization: Secondary | ICD-10-CM

## 2016-05-17 NOTE — Progress Notes (Signed)
Subjective:    Patient ID: Brad Taylor, male    DOB: 10-13-1950, 65 y.o.   MRN: [id number]  HPI  05/09/16 September 25, the patient suffered a laceration to his right lateral shin. He struck his leg on a trailer hitch. He suffered a deep skin tear. Patient went to the emergency room where the laceration was closed with 4-0 Ethilon sutures.  It appears as though there are 6 sutures. However the patient suffers from chronic venous insufficiency and severe edema in his legs. As a result of the laceration, the patient has been unable to wear his compression stockings. Therefore he now has +2 pitting edema in both legs. This has caused the laceration to separate. As result the surrounding skin has become infected. It is now hot erythematous and tender to the touch. The laceration is 5.5 cm x 4 cm in the shape of a D. I removed the 6 sutures.  At that time, my plan was: Sutures are removed without difficulty. However the wound has become infected and has also dehisced due to the pitting edema in his right leg. I will start the patient on Bactrim double strength tablets 1 by mouth twice a day for 7 days given his history of staph infections to cover his cellulitis. To assist in the wound healing, I will treat this similar to a venous stasis ulcer because it is behaving similar to that. The patient was placed in an [employer]. The wound was cleaned and covered with Silvadene and a nonadherent gauze and then the Unna boot was applied to his knee. Recheck here on Monday to replace the dressing.  Continue dressing changes in a similar fashion until the wound has healed  05/13/16 The swelling in his right leg is much better. The erythema has resolved. I removed his Haematologist today. The triangular-shaped skin tear has now been replaced with a V-shaped venous stasis ulcer that is still approximately 5 cm in length and 4 cm in width from the pedicle of the "V" to the top of the "V".  At that time, my plan was:  Cellulitis has resolved. Now there is a "V" shaped ulcer/wound on the shin.  Unna boot was replaced and the wound was cleaned. Recheck on Friday  05/17/16 Patient is here today for a wound recheck. Una boot was removed. Wound appears worse. It is now 5.5 cm long by 4 cm wide. This is essentially the same as last time but the individual components of the "V". There is no evidence of cellulitis or purulent drainage. The wound was cleaned thoroughly Past Medical History:  Diagnosis Date  . AAA (abdominal aortic aneurysm) ([place]) 5/15   3.5x3.5 cm  . COPD (chronic obstructive pulmonary disease) ([place])   . Depression   . Diabetes mellitus without complication ([place])   . GERD (gastroesophageal reflux disease)   . Hyperlipidemia   . Hypothyroidism   . Post traumatic stress disorder   . Stroke ([place])   . Thyroid disease   . Ulcer [facility])    Past Surgical History:  Procedure Laterality Date  . CATARACT EXTRACTION    . CHOLECYSTECTOMY    . KNEE SURGERY     Current Outpatient Prescriptions on File Prior to Visit  Medication Sig Dispense Refill  . atorvastatin (LIPITOR) 10 MG tablet Take 10 mg by mouth daily. Takes 5 mg    . buPROPion (WELLBUTRIN SR) 150 MG 12 hr tablet Take 1 tablet (150 mg total) by mouth daily. 30 tablet  1  . busPIRone (BUSPAR) 10 MG tablet Take 0.5 tablets (5 mg total) by mouth 2 (two) times daily as needed (for anxiety). 60 tablet 0  . clopidogrel (PLAVIX) 75 MG tablet Take 1 tablet (75 mg total) by mouth daily with breakfast. 90 tablet 3  . insulin glargine (LANTUS) 100 UNIT/ML injection 40 units every a.m. & 50 units qhs    . levothyroxine (SYNTHROID, LEVOTHROID) 125 MCG tablet Take 1 tablet (125 mcg total) by mouth daily before breakfast. 30 tablet 1  . ondansetron (ZOFRAN) 4 MG tablet Take 1 tablet (4 mg total) by mouth every 8 (eight) hours as needed for nausea or vomiting. 20 tablet 0  . RABEprazole (ACIPHEX) 20 MG tablet Take 2 tablets (40 mg total) by mouth daily before  breakfast. 30 tablet 1  . sulfamethoxazole-trimethoprim (BACTRIM DS,SEPTRA DS) 800-160 MG tablet Take 1 tablet by mouth 2 (two) times daily. 20 tablet 0  . sulfamethoxazole-trimethoprim (BACTRIM DS,SEPTRA DS) 800-160 MG tablet Take 1 tablet by mouth 2 (two) times daily. 14 tablet 0  . traZODone (DESYREL) 100 MG tablet Take 3 tablets (300 mg total) by mouth at bedtime. 30 tablet 0  . venlafaxine XR (EFFEXOR-XR) 150 MG 24 hr capsule Take 1 capsule (150 mg total) by mouth daily with breakfast. 30 capsule 1   No current facility-administered medications on file prior to visit.    No Known Allergies Social History   Social History  . Marital status: Married    Spouse name: Diane  . Number of children: 1  . Years of education: HS   Occupational History  . Retired    Social History Main Topics  . Smoking status: Current Every Day Smoker    Packs/day: 1.00    Years: 30.00    Types: Cigarettes  . Smokeless tobacco: Former Systems developer    Quit date: 05/04/2013  . Alcohol use No     Comment: Former heavy drinker, been sober for 10 years  . Drug use: No  . Sexual activity: Not on file   Other Topics Concern  . Not on file   Social History Narrative   Patient lives at home with spouse.   Caffeine Use: 3-4 cups daily   Retired Dealer.      Review of Systems  All other systems reviewed and are negative.      Objective:   Physical Exam  Cardiovascular: Normal rate, regular rhythm and normal heart sounds.   Pulmonary/Chest: Effort normal and breath sounds normal.  Musculoskeletal: He exhibits edema.  Skin: Skin is warm. No rash noted. No erythema.  Vitals reviewed.    See hpi     Assessment & Plan:  Wound of right leg, sequela - Plan: Ambulatory referral to [place]  Unfortunately this looks like is going to take quite some time to heal. I recommended twice weekly Unna boot changes. I will try to have home health nursing come changes Unna boot once a week. I can change it  another day a week this way he can get a clean dressing every 3-4 days. I will see the patient back on Tuesday.  Wound was cleaned thoroughly, covered in Silvadene and a nonadherent gauze, and an [place] boot was replaced

## 2016-05-17 NOTE — Addendum Note (Signed)
Addended by: Shary Decamp B on: 05/17/2016 05:04 PM   Modules accepted: Orders

## 2016-05-21 ENCOUNTER — Ambulatory Visit (INDEPENDENT_AMBULATORY_CARE_PROVIDER_SITE_OTHER): Payer: Commercial Managed Care - HMO | Admitting: Family Medicine

## 2016-05-21 DIAGNOSIS — S81801S Unspecified open wound, right lower leg, sequela: Secondary | ICD-10-CM | POA: Diagnosis not present

## 2016-05-21 NOTE — Progress Notes (Signed)
Subjective:    Patient ID: Brad Taylor, male    DOB: 17-Dec-1950, 65 y.o.   MRN: [id number]  HPI  05/09/16 September 25, the patient suffered a laceration to his right lateral shin. He struck his leg on a trailer hitch. He suffered a deep skin tear. Patient went to the emergency room where the laceration was closed with 4-0 Ethilon sutures.  It appears as though there are 6 sutures. However the patient suffers from chronic venous insufficiency and severe edema in his legs. As a result of the laceration, the patient has been unable to wear his compression stockings. Therefore he now has +2 pitting edema in both legs. This has caused the laceration to separate. As result the surrounding skin has become infected. It is now hot erythematous and tender to the touch. The laceration is 5.5 cm x 4 cm in the shape of a D. I removed the 6 sutures.  At that time, my plan was: Sutures are removed without difficulty. However the wound has become infected and has also dehisced due to the pitting edema in his right leg. I will start the patient on Bactrim double strength tablets 1 by mouth twice a day for 7 days given his history of staph infections to cover his cellulitis. To assist in the wound healing, I will treat this similar to a venous stasis ulcer because it is behaving similar to that. The patient was placed in an [employer]. The wound was cleaned and covered with Silvadene and a nonadherent gauze and then the Unna boot was applied to his knee. Recheck here on Monday to replace the dressing.  Continue dressing changes in a similar fashion until the wound has healed  05/13/16 The swelling in his right leg is much better. The erythema has resolved. I removed his Haematologist today. The triangular-shaped skin tear has now been replaced with a V-shaped venous stasis ulcer that is still approximately 5 cm in length and 4 cm in width from the pedicle of the "V" to the top of the "V".  At that time, my plan was:  Cellulitis has resolved. Now there is a "V" shaped ulcer/wound on the shin.  Unna boot was replaced and the wound was cleaned. Recheck on Friday  05/17/16 Patient is here today for a wound recheck. Una boot was removed. Wound appears worse. It is now 5.5 cm long by 4 cm wide. This is essentially the same as last time but the individual components of the "V". There is no evidence of cellulitis or purulent drainage. The wound was cleaned thoroughly.  At that time, my plan was: Unfortunately this looks like is going to take quite some time to heal. I recommended twice weekly Unna boot changes. I will try to have home health nursing come changes Unna boot once a week. I can change it another day a week this way he can get a clean dressing every 3-4 days. I will see the patient back on Tuesday.  Wound was cleaned thoroughly, covered in Silvadene and a nonadherent gauze, and an [place] boot was replaced  05/21/16 Unna boot is removed.  Wound is 5 cm x 3.5 cm. There is thick opaque yellowish brown discharge there was cleaned thoroughly with hydrogen peroxide. Past Medical History:  Diagnosis Date  . AAA (abdominal aortic aneurysm) ([place]) 5/15   3.5x3.5 cm  . COPD (chronic obstructive pulmonary disease) ([place])   . Depression   . Diabetes mellitus without complication ([place])   .  GERD (gastroesophageal reflux disease)   . Hyperlipidemia   . Hypothyroidism   . Post traumatic stress disorder   . Stroke ([place])   . Thyroid disease   . Ulcer [facility])    Past Surgical History:  Procedure Laterality Date  . CATARACT EXTRACTION    . CHOLECYSTECTOMY    . KNEE SURGERY     Current Outpatient Prescriptions on File Prior to Visit  Medication Sig Dispense Refill  . atorvastatin (LIPITOR) 10 MG tablet Take 10 mg by mouth daily. Takes 5 mg    . buPROPion (WELLBUTRIN SR) 150 MG 12 hr tablet Take 1 tablet (150 mg total) by mouth daily. 30 tablet 1  . busPIRone (BUSPAR) 10 MG tablet Take 0.5 tablets (5 mg total) by mouth 2  (two) times daily as needed (for anxiety). 60 tablet 0  . clopidogrel (PLAVIX) 75 MG tablet Take 1 tablet (75 mg total) by mouth daily with breakfast. 90 tablet 3  . insulin glargine (LANTUS) 100 UNIT/ML injection 40 units every a.m. & 50 units qhs    . levothyroxine (SYNTHROID, LEVOTHROID) 125 MCG tablet Take 1 tablet (125 mcg total) by mouth daily before breakfast. 30 tablet 1  . ondansetron (ZOFRAN) 4 MG tablet Take 1 tablet (4 mg total) by mouth every 8 (eight) hours as needed for nausea or vomiting. 20 tablet 0  . RABEprazole (ACIPHEX) 20 MG tablet Take 2 tablets (40 mg total) by mouth daily before breakfast. 30 tablet 1  . sulfamethoxazole-trimethoprim (BACTRIM DS,SEPTRA DS) 800-160 MG tablet Take 1 tablet by mouth 2 (two) times daily. 20 tablet 0  . sulfamethoxazole-trimethoprim (BACTRIM DS,SEPTRA DS) 800-160 MG tablet Take 1 tablet by mouth 2 (two) times daily. 14 tablet 0  . traZODone (DESYREL) 100 MG tablet Take 3 tablets (300 mg total) by mouth at bedtime. 30 tablet 0  . venlafaxine XR (EFFEXOR-XR) 150 MG 24 hr capsule Take 1 capsule (150 mg total) by mouth daily with breakfast. 30 capsule 1   No current facility-administered medications on file prior to visit.    No Known Allergies Social History   Social History  . Marital status: Married    Spouse name: Diane  . Number of children: 1  . Years of education: HS   Occupational History  . Retired    Social History Main Topics  . Smoking status: Current Every Day Smoker    Packs/day: 1.00    Years: 30.00    Types: Cigarettes  . Smokeless tobacco: Former Systems developer    Quit date: 05/04/2013  . Alcohol use No     Comment: Former heavy drinker, been sober for 10 years  . Drug use: No  . Sexual activity: Not on file   Other Topics Concern  . Not on file   Social History Narrative   Patient lives at home with spouse.   Caffeine Use: 3-4 cups daily   Retired Dealer.      Review of Systems  All other systems reviewed and  are negative.      Objective:   Physical Exam  Cardiovascular: Normal rate, regular rhythm and normal heart sounds.   Pulmonary/Chest: Effort normal and breath sounds normal.  Musculoskeletal: He exhibits edema.  Skin: Skin is warm. No rash noted. No erythema.  Vitals reviewed.    See hpi     Assessment & Plan:  Wound of right leg, sequela   I will see the patient back on Friday.  Wound was cleaned thoroughly, covered in Silvadene and a  nonadherent gauze, and an [place] boot was replaced

## 2016-05-24 ENCOUNTER — Telehealth: Payer: Self-pay | Admitting: Family Medicine

## 2016-05-24 ENCOUNTER — Ambulatory Visit: Payer: Commercial Managed Care - HMO | Admitting: Family Medicine

## 2016-05-24 DIAGNOSIS — L97819 Non-pressure chronic ulcer of other part of right lower leg with unspecified severity: Secondary | ICD-10-CM | POA: Diagnosis not present

## 2016-05-24 DIAGNOSIS — I872 Venous insufficiency (chronic) (peripheral): Secondary | ICD-10-CM | POA: Diagnosis not present

## 2016-05-24 DIAGNOSIS — J449 Chronic obstructive pulmonary disease, unspecified: Secondary | ICD-10-CM | POA: Diagnosis not present

## 2016-05-24 DIAGNOSIS — F329 Major depressive disorder, single episode, unspecified: Secondary | ICD-10-CM | POA: Diagnosis not present

## 2016-05-24 DIAGNOSIS — I69351 Hemiplegia and hemiparesis following cerebral infarction affecting right dominant side: Secondary | ICD-10-CM | POA: Diagnosis not present

## 2016-05-24 DIAGNOSIS — E1165 Type 2 diabetes mellitus with hyperglycemia: Secondary | ICD-10-CM | POA: Diagnosis not present

## 2016-05-24 NOTE — Telephone Encounter (Signed)
There to re wrap UNA boots.  Wound is draining heavily and macerated.  Pt has no silvadene.  Nurse recommends Calcium Alginate to wound and increase wound visit to twice weekly so he doesn't need to come to office.  Please advise.  She will send you wound care updates while doing care. Provider was asked and agrees with Calcium alginate and bi-weekly visits.  Pt should follow up here in 2-3 weeks for [place] to see.  [place] nurse advised.

## 2016-05-27 DIAGNOSIS — E1165 Type 2 diabetes mellitus with hyperglycemia: Secondary | ICD-10-CM | POA: Diagnosis not present

## 2016-05-27 DIAGNOSIS — I69351 Hemiplegia and hemiparesis following cerebral infarction affecting right dominant side: Secondary | ICD-10-CM | POA: Diagnosis not present

## 2016-05-27 DIAGNOSIS — J449 Chronic obstructive pulmonary disease, unspecified: Secondary | ICD-10-CM | POA: Diagnosis not present

## 2016-05-27 DIAGNOSIS — F329 Major depressive disorder, single episode, unspecified: Secondary | ICD-10-CM | POA: Diagnosis not present

## 2016-05-27 DIAGNOSIS — L97819 Non-pressure chronic ulcer of other part of right lower leg with unspecified severity: Secondary | ICD-10-CM | POA: Diagnosis not present

## 2016-05-27 DIAGNOSIS — I872 Venous insufficiency (chronic) (peripheral): Secondary | ICD-10-CM | POA: Diagnosis not present

## 2016-05-30 DIAGNOSIS — F329 Major depressive disorder, single episode, unspecified: Secondary | ICD-10-CM | POA: Diagnosis not present

## 2016-05-30 DIAGNOSIS — L97819 Non-pressure chronic ulcer of other part of right lower leg with unspecified severity: Secondary | ICD-10-CM | POA: Diagnosis not present

## 2016-05-30 DIAGNOSIS — E1165 Type 2 diabetes mellitus with hyperglycemia: Secondary | ICD-10-CM | POA: Diagnosis not present

## 2016-05-30 DIAGNOSIS — I69351 Hemiplegia and hemiparesis following cerebral infarction affecting right dominant side: Secondary | ICD-10-CM | POA: Diagnosis not present

## 2016-05-30 DIAGNOSIS — I872 Venous insufficiency (chronic) (peripheral): Secondary | ICD-10-CM | POA: Diagnosis not present

## 2016-05-30 DIAGNOSIS — J449 Chronic obstructive pulmonary disease, unspecified: Secondary | ICD-10-CM | POA: Diagnosis not present

## 2016-06-03 DIAGNOSIS — I69351 Hemiplegia and hemiparesis following cerebral infarction affecting right dominant side: Secondary | ICD-10-CM | POA: Diagnosis not present

## 2016-06-03 DIAGNOSIS — I872 Venous insufficiency (chronic) (peripheral): Secondary | ICD-10-CM | POA: Diagnosis not present

## 2016-06-03 DIAGNOSIS — J449 Chronic obstructive pulmonary disease, unspecified: Secondary | ICD-10-CM | POA: Diagnosis not present

## 2016-06-03 DIAGNOSIS — E1165 Type 2 diabetes mellitus with hyperglycemia: Secondary | ICD-10-CM | POA: Diagnosis not present

## 2016-06-03 DIAGNOSIS — F329 Major depressive disorder, single episode, unspecified: Secondary | ICD-10-CM | POA: Diagnosis not present

## 2016-06-03 DIAGNOSIS — L97819 Non-pressure chronic ulcer of other part of right lower leg with unspecified severity: Secondary | ICD-10-CM | POA: Diagnosis not present

## 2016-06-06 DIAGNOSIS — I69351 Hemiplegia and hemiparesis following cerebral infarction affecting right dominant side: Secondary | ICD-10-CM | POA: Diagnosis not present

## 2016-06-06 DIAGNOSIS — F329 Major depressive disorder, single episode, unspecified: Secondary | ICD-10-CM | POA: Diagnosis not present

## 2016-06-06 DIAGNOSIS — J449 Chronic obstructive pulmonary disease, unspecified: Secondary | ICD-10-CM | POA: Diagnosis not present

## 2016-06-06 DIAGNOSIS — L97819 Non-pressure chronic ulcer of other part of right lower leg with unspecified severity: Secondary | ICD-10-CM | POA: Diagnosis not present

## 2016-06-06 DIAGNOSIS — E1165 Type 2 diabetes mellitus with hyperglycemia: Secondary | ICD-10-CM | POA: Diagnosis not present

## 2016-06-06 DIAGNOSIS — I872 Venous insufficiency (chronic) (peripheral): Secondary | ICD-10-CM | POA: Diagnosis not present

## 2016-06-10 ENCOUNTER — Ambulatory Visit (INDEPENDENT_AMBULATORY_CARE_PROVIDER_SITE_OTHER): Payer: Commercial Managed Care - HMO | Admitting: Family Medicine

## 2016-06-10 ENCOUNTER — Encounter: Payer: Self-pay | Admitting: Family Medicine

## 2016-06-10 VITALS — BP 140/80 | HR 68 | Temp 98.0°F | Resp 22 | Ht 70.75 in | Wt 255.0 lb

## 2016-06-10 DIAGNOSIS — S81801S Unspecified open wound, right lower leg, sequela: Secondary | ICD-10-CM | POA: Diagnosis not present

## 2016-06-10 DIAGNOSIS — S81801D Unspecified open wound, right lower leg, subsequent encounter: Secondary | ICD-10-CM

## 2016-06-10 MED ORDER — SILVER SULFADIAZINE 1 % EX CREA: 1.0000 "application " | TOPICAL_CREAM | Freq: Every day | CUTANEOUS | 1 refills | Status: DC

## 2016-06-10 NOTE — Progress Notes (Signed)
Subjective:    Patient ID: Brad Taylor, male    DOB: 1950/10/21, 65 y.o.   MRN: [id number]  HPI  05/09/16 September 25, the patient suffered a laceration to his right lateral shin. He struck his leg on a trailer hitch. He suffered a deep skin tear. Patient went to the emergency room where the laceration was closed with 4-0 Ethilon sutures.  It appears as though there are 6 sutures. However the patient suffers from chronic venous insufficiency and severe edema in his legs. As a result of the laceration, the patient has been unable to wear his compression stockings. Therefore he now has +2 pitting edema in both legs. This has caused the laceration to separate. As result the surrounding skin has become infected. It is now hot erythematous and tender to the touch. The laceration is 5.5 cm x 4 cm in the shape of a D. I removed the 6 sutures.  At that time, my plan was: Sutures are removed without difficulty. However the wound has become infected and has also dehisced due to the pitting edema in his right leg. I will start the patient on Bactrim double strength tablets 1 by mouth twice a day for 7 days given his history of staph infections to cover his cellulitis. To assist in the wound healing, I will treat this similar to a venous stasis ulcer because it is behaving similar to that. The patient was placed in an [employer]. The wound was cleaned and covered with Silvadene and a nonadherent gauze and then the Unna boot was applied to his knee. Recheck here on Monday to replace the dressing.  Continue dressing changes in a similar fashion until the wound has healed  05/13/16 The swelling in his right leg is much better. The erythema has resolved. I removed his Haematologist today. The triangular-shaped skin tear has now been replaced with a V-shaped venous stasis ulcer that is still approximately 5 cm in length and 4 cm in width from the pedicle of the "V" to the top of the "V".  At that time, my plan was:  Cellulitis has resolved. Now there is a "V" shaped ulcer/wound on the shin.  Unna boot was replaced and the wound was cleaned. Recheck on Friday  05/17/16 Patient is here today for a wound recheck. Una boot was removed. Wound appears worse. It is now 5.5 cm long by 4 cm wide. This is essentially the same as last time but the individual components of the "V". There is no evidence of cellulitis or purulent drainage. The wound was cleaned thoroughly.  At that time, my plan was: Unfortunately this looks like is going to take quite some time to heal. I recommended twice weekly Unna boot changes. I will try to have home health nursing come changes Unna boot once a week. I can change it another day a week this way he can get a clean dressing every 3-4 days. I will see the patient back on Tuesday.  Wound was cleaned thoroughly, covered in Silvadene and a nonadherent gauze, and an [place] boot was replaced  05/21/16 Unna boot is removed.  Wound is 5 cm x 3.5 cm. There is thick opaque yellowish brown discharge there was cleaned thoroughly with hydrogen peroxide.  At that time, my plan was:  I will see the patient back on Friday.  Wound was cleaned thoroughly, covered in Silvadene and a nonadherent gauze, and an [place] boot was replaced  06/10/16 Home health nursing has been  changing the patient's dressings 2-3 times a week also using calcium alginate to help decrease the amount of discharge from the wound. Today the wound bed looks healthy. There is a substantial amount of red granulation tissue in the base of the wound. There is very little discharge although the patient just removed his dressing. Unfortunately however the wound is not decreasing in size at all. It is still in a V-shaped a proximally 5 cm x 4 cm. If anything it looks deeper today than it did the last I saw him. I am unhappy with his progress despite our best efforts. Past Medical History:  Diagnosis Date  . AAA (abdominal aortic aneurysm) ([place]) 5/15    3.5x3.5 cm  . COPD (chronic obstructive pulmonary disease) ([place])   . Depression   . Diabetes mellitus without complication ([place])   . GERD (gastroesophageal reflux disease)   . Hyperlipidemia   . Hypothyroidism   . Post traumatic stress disorder   . Stroke ([place])   . Thyroid disease   . Ulcer [facility])    Past Surgical History:  Procedure Laterality Date  . CATARACT EXTRACTION    . CHOLECYSTECTOMY    . KNEE SURGERY     Current Outpatient Prescriptions on File Prior to Visit  Medication Sig Dispense Refill  . atorvastatin (LIPITOR) 10 MG tablet Take 10 mg by mouth daily. Takes 5 mg    . buPROPion (WELLBUTRIN SR) 150 MG 12 hr tablet Take 1 tablet (150 mg total) by mouth daily. 30 tablet 1  . busPIRone (BUSPAR) 10 MG tablet Take 0.5 tablets (5 mg total) by mouth 2 (two) times daily as needed (for anxiety). 60 tablet 0  . clopidogrel (PLAVIX) 75 MG tablet Take 1 tablet (75 mg total) by mouth daily with breakfast. 90 tablet 3  . insulin glargine (LANTUS) 100 UNIT/ML injection 40 units every a.m. & 50 units qhs    . levothyroxine (SYNTHROID, LEVOTHROID) 125 MCG tablet Take 1 tablet (125 mcg total) by mouth daily before breakfast. 30 tablet 1  . ondansetron (ZOFRAN) 4 MG tablet Take 1 tablet (4 mg total) by mouth every 8 (eight) hours as needed for nausea or vomiting. 20 tablet 0  . RABEprazole (ACIPHEX) 20 MG tablet Take 2 tablets (40 mg total) by mouth daily before breakfast. 30 tablet 1  . sulfamethoxazole-trimethoprim (BACTRIM DS,SEPTRA DS) 800-160 MG tablet Take 1 tablet by mouth 2 (two) times daily. 20 tablet 0  . sulfamethoxazole-trimethoprim (BACTRIM DS,SEPTRA DS) 800-160 MG tablet Take 1 tablet by mouth 2 (two) times daily. 14 tablet 0  . traZODone (DESYREL) 100 MG tablet Take 3 tablets (300 mg total) by mouth at bedtime. 30 tablet 0  . venlafaxine XR (EFFEXOR-XR) 150 MG 24 hr capsule Take 1 capsule (150 mg total) by mouth daily with breakfast. 30 capsule 1   No current  facility-administered medications on file prior to visit.    No Known Allergies Social History   Social History  . Marital status: Married    Spouse name: Diane  . Number of children: 1  . Years of education: HS   Occupational History  . Retired    Social History Main Topics  . Smoking status: Current Every Day Smoker    Packs/day: 1.00    Years: 30.00    Types: Cigarettes  . Smokeless tobacco: Former Systems developer    Quit date: 05/04/2013  . Alcohol use No     Comment: Former heavy drinker, been sober for 10 years  . Drug  use: No  . Sexual activity: Not on file   Other Topics Concern  . Not on file   Social History Narrative   Patient lives at home with spouse.   Caffeine Use: 3-4 cups daily   Retired Dealer.      Review of Systems  All other systems reviewed and are negative.      Objective:   Physical Exam  Cardiovascular: Normal rate, regular rhythm and normal heart sounds.   Pulmonary/Chest: Effort normal and breath sounds normal.  Musculoskeletal: He exhibits edema.  Skin: Skin is warm. No rash noted. No erythema.  Vitals reviewed.    See hpi     Assessment & Plan:  Wound of right leg, sequela - Plan: Ambulatory referral to Wound Clinic At this point, the patient has had a venous stasis ulcer for more than a month. Despite his excellent compliance and his best effort and my best effort we have been unsuccessful in seen any progress or improvement in the wound. I replaced his inability today. I placed Silvadene in the wound covered it with a nonadherent gauze and then placed the patient in an Haematologist. I will schedule the patient to see a wound clinic for a second opinion. Continue twice weekly dressing changes until seen by the wound clinic

## 2016-06-13 DIAGNOSIS — I872 Venous insufficiency (chronic) (peripheral): Secondary | ICD-10-CM | POA: Diagnosis not present

## 2016-06-13 DIAGNOSIS — J449 Chronic obstructive pulmonary disease, unspecified: Secondary | ICD-10-CM | POA: Diagnosis not present

## 2016-06-13 DIAGNOSIS — L97819 Non-pressure chronic ulcer of other part of right lower leg with unspecified severity: Secondary | ICD-10-CM | POA: Diagnosis not present

## 2016-06-13 DIAGNOSIS — I69351 Hemiplegia and hemiparesis following cerebral infarction affecting right dominant side: Secondary | ICD-10-CM | POA: Diagnosis not present

## 2016-06-13 DIAGNOSIS — F329 Major depressive disorder, single episode, unspecified: Secondary | ICD-10-CM | POA: Diagnosis not present

## 2016-06-13 DIAGNOSIS — E1165 Type 2 diabetes mellitus with hyperglycemia: Secondary | ICD-10-CM | POA: Diagnosis not present

## 2016-08-06 ENCOUNTER — Telehealth: Payer: Self-pay | Admitting: Family Medicine

## 2016-08-06 NOTE — Telephone Encounter (Signed)
Called and spoke to pt's wife and the wound does not sound like it is infected - no redness, no pus, no fever. Per [facility] only NTBS if any of those occur and will check on referral. Informed wife to keep covered and call back if any of the symptoms listed above.

## 2016-08-06 NOTE — Telephone Encounter (Signed)
Patient called for appointment, no appts available so advised patient to go to er or urgent care for wound assessment that is getting worse, did not care to do that, so made appt for next Monday, but in the meantime wanted dr pickard or nurse to call to try to get a different appointment  [phone]

## 2016-08-12 ENCOUNTER — Ambulatory Visit: Payer: Commercial Managed Care - HMO | Admitting: Family Medicine

## 2016-08-15 ENCOUNTER — Encounter: Payer: Commercial Managed Care - HMO | Attending: Surgery | Admitting: Surgery

## 2016-08-15 DIAGNOSIS — I252 Old myocardial infarction: Secondary | ICD-10-CM | POA: Insufficient documentation

## 2016-08-15 DIAGNOSIS — J449 Chronic obstructive pulmonary disease, unspecified: Secondary | ICD-10-CM | POA: Diagnosis not present

## 2016-08-15 DIAGNOSIS — Z794 Long term (current) use of insulin: Secondary | ICD-10-CM | POA: Insufficient documentation

## 2016-08-15 DIAGNOSIS — F17218 Nicotine dependence, cigarettes, with other nicotine-induced disorders: Secondary | ICD-10-CM | POA: Diagnosis not present

## 2016-08-15 DIAGNOSIS — E11622 Type 2 diabetes mellitus with other skin ulcer: Secondary | ICD-10-CM | POA: Diagnosis not present

## 2016-08-15 DIAGNOSIS — Z79899 Other long term (current) drug therapy: Secondary | ICD-10-CM | POA: Insufficient documentation

## 2016-08-15 DIAGNOSIS — E039 Hypothyroidism, unspecified: Secondary | ICD-10-CM | POA: Diagnosis not present

## 2016-08-15 DIAGNOSIS — I10 Essential (primary) hypertension: Secondary | ICD-10-CM | POA: Insufficient documentation

## 2016-08-15 DIAGNOSIS — Z8673 Personal history of transient ischemic attack (TIA), and cerebral infarction without residual deficits: Secondary | ICD-10-CM | POA: Insufficient documentation

## 2016-08-15 DIAGNOSIS — I872 Venous insufficiency (chronic) (peripheral): Secondary | ICD-10-CM | POA: Diagnosis not present

## 2016-08-15 DIAGNOSIS — L97212 Non-pressure chronic ulcer of right calf with fat layer exposed: Secondary | ICD-10-CM | POA: Insufficient documentation

## 2016-08-16 NOTE — Progress Notes (Signed)
KARIEM, BRISBANE ([id number]) Visit Report for 08/15/2016 Allergy List Details Patient Name: Brad Taylor, Brad Taylor. Date of Service: 08/15/2016 9:00 AM Medical Record Number: [id number] Patient Account Number: [id number] Date of Birth/Sex: [birth year] (66 y.o. Male) Treating RN: Baruch Gouty, RN, BSN, Velva Harman Primary Care Physician: Jenna Luo Other Clinician: Referring Physician: Jenna Luo Treating Physician/Extender: Frann Rider in Treatment: 0 Allergies Active Allergies no known allergies Allergy Notes Electronic Signature(s) Signed: 08/15/2016 5:45:48 PM By: Regan Lemming BSN, RN Entered By: Regan Lemming on 08/15/2016 08:59:00 Brad Taylor ([id number]) -------------------------------------------------------------------------------- Yaak Details Patient Name: Brad Taylor, Brad Taylor. Date of Service: 08/15/2016 9:00 AM Medical Record Number: [id number] Patient Account Number: [id number] Date of Birth/Sex: July 04, 1951 (66 y.o. Male) Treating RN: Baruch Gouty, RN, BSN, Velva Harman Primary Care Physician: Jenna Luo Other Clinician: Referring Physician: Jenna Luo Treating Physician/Extender: Frann Rider in Treatment: 0 Visit Information Patient Arrived: Ambulatory Arrival Time: 08:55 Accompanied By: wife Transfer Assistance: None Patient Identification Verified: Yes Secondary Verification Process Yes Completed: Patient Requires Transmission-Based No Precautions: Patient Has Alerts: Yes Patient Alerts: Hgb A1c 8.4 Electronic Signature(s) Signed: 08/15/2016 5:45:48 PM By: Regan Lemming BSN, RN Entered By: Regan Lemming on 08/15/2016 09:39:18 Brad Taylor ([id number]) -------------------------------------------------------------------------------- Clinic Level of Care Assessment Details Patient Name: Brad Taylor. Date of Service: 08/15/2016 9:00 AM Medical Record Number: [id number] Patient Account Number: [id number] Date of Birth/Sex: 1950-10-28 (66 y.o.  Male) Treating RN: Baruch Gouty, RN, BSN, Velva Harman Primary Care Physician: Jenna Luo Other Clinician: Referring Physician: Jenna Luo Treating Physician/Extender: Frann Rider in Treatment: 0 Clinic Level of Care Assessment Items TOOL 1 Quantity Score []  - Use when EandM and Procedure is performed on INITIAL visit 0 ASSESSMENTS - Nursing Assessment / Reassessment X - General Physical Exam (combine w/ comprehensive assessment (listed just 1 20 below) when performed on new pt. evals) X - Comprehensive Assessment (HX, ROS, Risk Assessments, Wounds Hx, etc.) 1 25 ASSESSMENTS - Wound and Skin Assessment / Reassessment []  - Dermatologic / Skin Assessment (not related to wound area) 0 ASSESSMENTS - Ostomy and/or Continence Assessment and Care []  - Incontinence Assessment and Management 0 []  - Ostomy Care Assessment and Management (repouching, etc.) 0 PROCESS - Coordination of Care X - Simple Patient / Family Education for ongoing care 1 15 []  - Complex (extensive) Patient / Family Education for ongoing care 0 X - Staff obtains Programmer, systems, Records, Test Results / Process Orders 1 10 X - Staff telephones HHA, Nursing Homes / Clarify orders / etc 1 10 []  - Routine Transfer to another Facility (non-emergent condition) 0 []  - Routine Hospital Admission (non-emergent condition) 0 X - New Admissions / Biomedical engineer / Ordering NPWT, Apligraf, etc. 1 15 []  - Emergency Hospital Admission (emergent condition) 0 PROCESS - Special Needs []  - Pediatric / Minor Patient Management 0 []  - Isolation Patient Management 0 Brad Taylor, Brad Taylor. ([id number]) []  - Hearing / Language / Visual special needs 0 []  - Assessment of Community assistance (transportation, D/C planning, etc.) 0 []  - Additional assistance / Altered mentation 0 []  - Support Surface(s) Assessment (bed, cushion, seat, etc.) 0 INTERVENTIONS - Miscellaneous []  - External ear exam 0 []  - Patient Transfer (multiple staff / Librarian, academic / Similar devices) 0 []  - Simple Staple / Suture removal (25 or less) 0 []  - Complex Staple / Suture removal (26 or more) 0 []  - Hypo/Hyperglycemic Management (do not check if billed separately) 0 []  - Ankle / Brachial Index (ABI) - do not check if billed separately 0  Has the patient been seen at the hospital within the last three years: Yes Total Score: 95 Level Of Care: New/Established - Level 3 Electronic Signature(s) Signed: 08/15/2016 5:45:48 PM By: Regan Lemming BSN, RN Entered By: Regan Lemming on 08/15/2016 09:45:58 Brad Taylor ([id number]) -------------------------------------------------------------------------------- Encounter Discharge Information Details Patient Name: Brad Taylor, Brad Taylor. Date of Service: 08/15/2016 9:00 AM Medical Record Number: [id number] Patient Account Number: [id number] Date of Birth/Sex: [birth year] (66 y.o. Male) Treating RN: Baruch Gouty, RN, BSN, Velva Harman Primary Care Physician: Jenna Luo Other Clinician: Referring Physician: Jenna Luo Treating Physician/Extender: Frann Rider in Treatment: 0 Encounter Discharge Information Items Discharge Pain Level: 0 Discharge Condition: Stable Ambulatory Status: Ambulatory Discharge Destination: Home Transportation: Private Auto Accompanied By: Hansel Feinstein Schedule Follow-up Appointment: No Medication Reconciliation completed and provided to Patient/Care No Lachrisha Ziebarth: Provided on Clinical Summary of Care: 08/15/2016 Form Type Recipient Paper Patient [employer] Electronic Signature(s) Signed: 08/15/2016 9:59:33 AM By: Ruthine Dose Entered By: Ruthine Dose on 08/15/2016 09:59:33 Cumby, Brad Taylor Form ([id number]) -------------------------------------------------------------------------------- Lower Extremity Assessment Details Patient Name: Brad Taylor. Date of Service: 08/15/2016 9:00 AM Medical Record Number: [id number] Patient Account Number: [id number] Date of Birth/Sex: 12/02/1950 (66 y.o.  Male) Treating RN: Baruch Gouty, RN, BSN, [place] Primary Care Physician: Jenna Luo Other Clinician: Referring Physician: Jenna Luo Treating Physician/Extender: Frann Rider in Treatment: 0 Edema Assessment Assessed: [Left: No] [Right: No] E[Left: dema] [Right: :] Calf Left: Right: Point of Measurement: 39 cm From Medial Instep cm 40.5 cm Ankle Left: Right: Point of Measurement: 9 cm From Medial Instep cm 24 cm Vascular Assessment Claudication: Claudication Assessment [Right:None] Pulses: Dorsalis Pedis Palpable: [Right:No] Doppler Audible: [Right:Yes] Posterior Tibial Palpable: [Right:Yes] Extremity colors, hair growth, and conditions: Extremity Color: [Right:Mottled] Hair Growth on Extremity: [Right:Yes] Temperature of Extremity: [Right:Warm] Capillary Refill: [Right:< 3 seconds] Blood Pressure: Brachial: [Right:150] Toe Nail Assessment Left: Right: Thick: Yes Discolored: Yes Deformed: No Improper Length and Hygiene: No Notes RICO, WECKESSER ([id number]) Unable to do ABI at present due to pain Electronic Signature(s) Signed: 08/15/2016 5:45:48 PM By: Regan Lemming BSN, RN Entered By: Regan Lemming on 08/15/2016 09:21:21 Brad Taylor ([id number]) -------------------------------------------------------------------------------- Multi Wound Chart Details Patient Name: Brad Taylor. Date of Service: 08/15/2016 9:00 AM Medical Record Number: [id number] Patient Account Number: [id number] Date of Birth/Sex: [birth year] (66 y.o. Male) Treating RN: Baruch Gouty, RN, BSN, Velva Harman Primary Care Physician: Jenna Luo Other Clinician: Referring Physician: Jenna Luo Treating Physician/Extender: Frann Rider in Treatment: 0 Vital Signs Height(in): Pulse(bpm): 72 Weight(lbs): Blood Pressure 150/85 (mmHg): Body Mass Index(BMI): Temperature(F): 97.8 Respiratory Rate 18 (breaths/min): Photos: [1:No Photos] [N/A:N/A] Wound Location: [1:Right Lower  Leg - Lateral] [N/A:N/A] Wounding Event: [1:Trauma] [N/A:N/A] Primary Etiology: [1:Diabetic Wound/Ulcer of the Lower Extremity] [N/A:N/A] Comorbid History: [1:Chronic Obstructive Pulmonary Disease (COPD), Arrhythmia, Hypertension, Myocardial Infarction, Type II Diabetes] [N/A:N/A] Date Acquired: [1:05/14/2016] [N/A:N/A] Weeks of Treatment: [1:0] [N/A:N/A] Wound Status: [1:Open] [N/A:N/A] Measurements L x W x D 6.2x3x0.1 [N/A:N/A] (cm) Area (cm) : [1:14.608] [N/A:N/A] Volume (cm) : [1:1.461] [N/A:N/A] Classification: [1:Grade 1] [N/A:N/A] Exudate Amount: [1:Medium] [N/A:N/A] Exudate Type: [1:Serosanguineous] [N/A:N/A] Exudate Color: [1:red, brown] [N/A:N/A] Wound Margin: [1:Flat and Intact] [N/A:N/A] Granulation Amount: [1:Small (1-33%)] [N/A:N/A] Granulation Quality: [1:Pink, Pale] [N/A:N/A] Necrotic Amount: [1:Large (67-100%)] [N/A:N/A] Exposed Structures: [1:Fascia: No Fat: No Tendon: No Muscle: No] [N/A:N/A] Joint: No Bone: No Limited to Skin Breakdown Epithelialization: None N/A N/A Debridement: Debridement [id number]- N/A N/A [id number]) Pre-procedure 09:40 N/A N/A Verification/Time Out Taken: Pain Control: Lidocaine 4% Topical N/A N/A Solution Tissue Debrided: Fibrin/Slough, N/A N/A Subcutaneous  Level: Skin/Subcutaneous N/A N/A Tissue Debridement Area (sq 18.6 N/A N/A cm): Instrument: Curette N/A N/A Bleeding: Minimum N/A N/A Hemostasis Achieved: Pressure N/A N/A Procedural Pain: 3 N/A N/A Post Procedural Pain: 3 N/A N/A Debridement Treatment Procedure was tolerated N/A N/A Response: well Post Debridement 6.2x3x0.1 N/A N/A Measurements L x W x D (cm) Post Debridement 1.461 N/A N/A Volume: (cm) Periwound Skin Texture: Edema: Yes N/A N/A Excoriation: No Induration: No Callus: No Crepitus: No Fluctuance: No Friable: No Rash: No Scarring: No Periwound Skin Moist: Yes N/A N/A Moisture: Maceration: No Dry/Scaly: No Periwound Skin Color: Hemosiderin  Staining: Yes N/A N/A Mottled: Yes Atrophie Blanche: No Cyanosis: No Ecchymosis: No Erythema: No Pallor: No Rubor: No Brad Taylor, Brad Taylor L. ([id number]) Temperature: No Abnormality N/A N/A Tenderness on Yes N/A N/A Palpation: Wound Preparation: Ulcer Cleansing: N/A N/A Rinsed/Irrigated with Saline Topical Anesthetic Applied: Other: lidocaine 4% Procedures Performed: Debridement N/A N/A Treatment Notes Wound #1 (Right, Lateral Lower Leg) 1. Cleansed with: Cleanse wound with antibacterial soap and water 4. Dressing Applied: Hydrafera Blue 5. Secondary Dressing Applied ABD Pad 7. Secured with Other (specify in notes) Notes [place] Signature(s) Signed: 08/15/2016 9:56:10 AM By: Christin Fudge MD, FACS Entered By: Christin Fudge on 08/15/2016 09:56:09 Brad Taylor ([id number]) -------------------------------------------------------------------------------- Brad Taylor Details Patient Name: Brad Taylor, Brad Taylor. Date of Service: 08/15/2016 9:00 AM Medical Record Number: [id number] Patient Account Number: [id number] Date of Birth/Sex: 15-Oct-1950 (66 y.o. Male) Treating RN: Baruch Gouty, RN, BSN, Velva Harman Primary Care Physician: Jenna Luo Other Clinician: Referring Physician: Jenna Luo Treating Physician/Extender: Frann Rider in Treatment: 0 Active Inactive Orientation to the Wound Care Program Nursing Diagnoses: Knowledge deficit related to the wound healing center program Goals: Patient/caregiver will verbalize understanding of the [place] Program Date Initiated: 08/15/2016 Goal Status: Active Interventions: Provide education on orientation to the wound center Notes: Venous Leg Ulcer Nursing Diagnoses: Knowledge deficit related to disease process and management Potential for venous Insuffiency (use before diagnosis confirmed) Goals: Patient will maintain optimal edema control Date Initiated: 08/15/2016 Goal  Status: Active Patient/caregiver will verbalize understanding of disease process and disease management Date Initiated: 08/15/2016 Goal Status: Active Verify adequate tissue perfusion prior to therapeutic compression application Date Initiated: 08/15/2016 Goal Status: Active Interventions: Assess peripheral edema status every visit. Compression as ordered Provide education on venous insufficiency AJAI, BURKLAND ([id number]) Treatment Activities: Therapeutic compression applied : 08/15/2016 Notes: Wound/Skin Impairment Nursing Diagnoses: Impaired tissue integrity Knowledge deficit related to ulceration/compromised skin integrity Goals: Patient/caregiver will verbalize understanding of skin care regimen Date Initiated: 08/15/2016 Goal Status: Active Ulcer/skin breakdown will have a volume reduction of 30% by week 4 Date Initiated: 08/15/2016 Goal Status: Active Ulcer/skin breakdown will have a volume reduction of 50% by week 8 Date Initiated: 08/15/2016 Goal Status: Active Ulcer/skin breakdown will have a volume reduction of 80% by week 12 Date Initiated: 08/15/2016 Goal Status: Active Ulcer/skin breakdown will heal within 14 weeks Date Initiated: 08/15/2016 Goal Status: Active Interventions: Assess patient/caregiver ability to obtain necessary supplies Assess patient/caregiver ability to perform ulcer/skin care regimen upon admission and as needed Assess ulceration(s) every visit Provide education on smoking Provide education on ulcer and skin care Treatment Activities: Referred to [facility] [employer] [place] for dressing supplies : 08/15/2016 Skin care regimen initiated : 08/15/2016 Topical wound management initiated : 08/15/2016 Notes: Electronic Signature(s) Signed: 08/15/2016 5:45:48 PM By: Regan Lemming BSN, RN Laraia, Brad Taylor Form ([id number]) Entered By: Regan Lemming on 08/15/2016 09:37:56 Double, Brad Taylor Form  ([id number]) -------------------------------------------------------------------------------- Pain Assessment  Details Patient Name: KELLI, MOSLEY. Date of Service: 08/15/2016 9:00 AM Medical Record Number: [id number] Patient Account Number: [id number] Date of Birth/Sex: 08-03-1951 (66 y.o. Male) Treating RN: Baruch Gouty, RN, BSN, Velva Harman Primary Care Physician: Jenna Luo Other Clinician: Referring Physician: Jenna Luo Treating Physician/Extender: Frann Rider in Treatment: 0 Active Problems Location of Pain Severity and Description of Pain Patient Has Paino Yes Site Locations Pain Location: Pain in Ulcers With Dressing Change: Yes Rate the pain. Current Pain Level: 4 Character of Pain Describe the Pain: Tender Pain Management and Medication Current Pain Management: Medication: Yes Rest: Yes How does your pain impact your activities of daily livingo Sleep: Yes Bathing: Yes Appetite: Yes Relationship With Others: Yes Bladder Continence: Yes Emotions: Yes Bowel Continence: Yes Work: Yes Toileting: Yes Drive: Yes Dressing: Yes Hobbies: Yes Electronic Signature(s) Signed: 08/15/2016 5:45:48 PM By: Regan Lemming BSN, RN Entered By: Regan Lemming on 08/15/2016 08:57:16 Brad Taylor ([id number]) -------------------------------------------------------------------------------- Patient/Caregiver Education Details Patient Name: Brad Taylor. Date of Service: 08/15/2016 9:00 AM Medical Record Number: [id number] Patient Account Number: [id number] Date of Birth/Gender: July 28, 1951 (66 y.o. Male) Treating RN: Baruch Gouty, RN, BSN, Velva Harman Primary Care Physician: Jenna Luo Other Clinician: Referring Physician: Jenna Luo Treating Physician/Extender: Frann Rider in Treatment: 0 Education Assessment Education Provided To: Patient Education Topics Provided Smoking and Wound Healing: Methods: Explain/Verbal Responses: State content  correctly Venous: Methods: Explain/Verbal Responses: State content correctly Welcome To [facility] [place]: Methods: Explain/Verbal Responses: State content correctly Wound/Skin Impairment: Methods: Explain/Verbal Responses: State content correctly Electronic Signature(s) Signed: 08/15/2016 5:45:48 PM By: Regan Lemming BSN, RN Entered By: Regan Lemming on 08/15/2016 09:59:29 Brad Taylor ([id number]) -------------------------------------------------------------------------------- Wound Assessment Details Patient Name: Renee Pain L. Date of Service: 08/15/2016 9:00 AM Medical Record Number: [id number] Patient Account Number: [id number] Date of Birth/Sex: April 07, 1951 (66 y.o. Male) Treating RN: Baruch Gouty, RN, BSN, [place] Primary Care Physician: Jenna Luo Other Clinician: Referring Physician: Jenna Luo Treating Physician/Extender: Frann Rider in Treatment: 0 Wound Status Wound Number: 1 Primary Diabetic Wound/Ulcer of the Lower Etiology: Extremity Wound Location: Right Lower Leg - Lateral Wound Open Wounding Event: Trauma Status: Date Acquired: 05/14/2016 Comorbid Chronic Obstructive Pulmonary Disease Weeks Of Treatment: 0 History: (COPD), Arrhythmia, Hypertension, Clustered Wound: No Myocardial Infarction, Type II Diabetes Photos Photo Uploaded By: Regan Lemming on 08/15/2016 12:38:24 Wound Measurements Length: (cm) 6.2 Width: (cm) 3 Depth: (cm) 0.1 Area: (cm) 14.608 Volume: (cm) 1.461 % Reduction in Area: % Reduction in Volume: Epithelialization: None Tunneling: No Undermining: No Wound Description Classification: Grade 1 Foul Odor Aft Wound Margin: Flat and Intact Exudate Amount: Medium Exudate Type: Serosanguineous Exudate Color: red, brown er Cleansing: No Wound Bed Granulation Amount: Small (1-33%) Exposed Structure Granulation Quality: Pink, Pale Fascia Exposed: No Necrotic Amount: Large (67-100%) Fat Layer Exposed: No Necrotic  Quality: Adherent Slough Tendon Exposed: No Brad Taylor, Brad Taylor. ([id number]) Muscle Exposed: No Joint Exposed: No Bone Exposed: No Limited to Skin Breakdown Periwound Skin Texture Texture Color No Abnormalities Noted: No No Abnormalities Noted: No Callus: No Atrophie Blanche: No Crepitus: No Cyanosis: No Excoriation: No Ecchymosis: No Fluctuance: No Erythema: No Friable: No Hemosiderin Staining: Yes Induration: No Mottled: Yes Localized Edema: Yes Pallor: No Rash: No Rubor: No Scarring: No Temperature / Pain Moisture Temperature: No Abnormality No Abnormalities Noted: No Tenderness on Palpation: Yes Dry / Scaly: No Maceration: No Moist: Yes Wound Preparation Ulcer Cleansing: Rinsed/Irrigated with Saline Topical Anesthetic Applied: Other: lidocaine 4%, Treatment Notes Wound #1 (Right, Lateral Lower Leg) 1.  Cleansed with: Cleanse wound with antibacterial soap and water 4. Dressing Applied: Hydrafera Blue 5. Secondary Dressing Applied ABD Pad 7. Secured with Other (specify in notes) Notes [place] Signature(s) Signed: 08/15/2016 5:45:48 PM By: Regan Lemming BSN, RN Entered By: Regan Lemming on 08/15/2016 09:17:32 Brad Taylor ([id number]) -------------------------------------------------------------------------------- Vitals Details Patient Name: Brad Taylor. Date of Service: 08/15/2016 9:00 AM Medical Record Number: [id number] Patient Account Number: [id number] Date of Birth/Sex: [birth year] (66 y.o. Male) Treating RN: Baruch Gouty, RN, BSN, Velva Harman Primary Care Physician: Jenna Luo Other Clinician: Referring Physician: Jenna Luo Treating Physician/Extender: Frann Rider in Treatment: 0 Vital Signs Time Taken: 08:59 Temperature (F): 97.8 Pulse (bpm): 72 Respiratory Rate (breaths/min): 18 Blood Pressure (mmHg): 150/85 Reference Range: 80 - 120 mg / dl Electronic Signature(s) Signed: 08/15/2016 5:45:48 PM By: Regan Lemming  BSN, RN Entered By: Regan Lemming on 08/15/2016 08:59:30

## 2016-08-16 NOTE — Progress Notes (Signed)
NASRI, CASUCCI ([id number]) Visit Report for 08/15/2016 Abuse/Suicide Risk Screen Details Patient Name: Brad Taylor, Brad Taylor. Date of Service: 08/15/2016 9:00 AM Medical Record Number: [id number] Patient Account Number: [id number] Date of Birth/Sex: October 18, 1950 (66 y.o. Male) Treating RN: Baruch Gouty, RN, BSN, Velva Harman Primary Care Physician: Jenna Luo Other Clinician: Referring Physician: Jenna Luo Treating Physician/Extender: Frann Rider in Treatment: 0 Abuse/Suicide Risk Screen Items Answer ABUSE/SUICIDE RISK SCREEN: Has anyone close to you tried to hurt or harm you recentlyo No Do you feel uncomfortable with anyone in your familyo No Has anyone forced you do things that you didnot want to doo No Do you have any thoughts of harming yourselfo No Patient displays signs or symptoms of abuse and/or neglect. No Electronic Signature(s) Signed: 08/15/2016 5:45:48 PM By: Regan Lemming BSN, RN Entered By: Regan Lemming on 08/15/2016 08:57:36 Nudelman, Eric Form ([id number]) -------------------------------------------------------------------------------- Activities of Daily Living Details Patient Name: Brad Taylor, Brad Taylor. Date of Service: 08/15/2016 9:00 AM Medical Record Number: [id number] Patient Account Number: [id number] Date of Birth/Sex: [birth year] (66 y.o. Male) Treating RN: Baruch Gouty, RN, BSN, Velva Harman Primary Care Physician: Jenna Luo Other Clinician: Referring Physician: Jenna Luo Treating Physician/Extender: Frann Rider in Treatment: 0 Activities of Daily Living Items Answer Activities of Daily Living (Please select one for each item) Drive Automobile Completely Able Take Medications Completely Able Use Telephone Completely Able Care for Appearance Completely Able Use Toilet Completely Able Bath / Shower Completely Able Dress Self Completely Able Feed Self Completely Able Walk Completely Able Get In / Out Bed Completely Able Housework Completely Able Prepare  Meals Completely [place] for Self Completely Able Electronic Signature(s) Signed: 08/15/2016 5:45:48 PM By: Regan Lemming BSN, RN Entered By: Regan Lemming on 08/15/2016 08:57:57 Brad Taylor ([id number]) -------------------------------------------------------------------------------- Education Assessment Details Patient Name: Brad Taylor. Date of Service: 08/15/2016 9:00 AM Medical Record Number: [id number] Patient Account Number: [id number] Date of Birth/Sex: [birth year] (66 y.o. Male) Treating RN: Baruch Gouty, RN, BSN, Velva Harman Primary Care Physician: Jenna Luo Other Clinician: Referring Physician: Jenna Luo Treating Physician/Extender: Frann Rider in Treatment: 0 Primary Learner Assessed: Patient Learning Preferences/Education Level/Primary Language Learning Preference: Explanation Highest Education Level: High School Preferred Language: English Cognitive Barrier Assessment/Beliefs Language Barrier: No Physical Barrier Assessment Impaired Vision: No Impaired Hearing: No Decreased Hand dexterity: No Knowledge/Comprehension Assessment Knowledge Level: High Comprehension Level: High Ability to understand written High instructions: Ability to understand verbal High instructions: Motivation Assessment Anxiety Level: Calm Cooperation: Cooperative Education Importance: Acknowledges Need Interest in Health Problems: Asks Questions Perception: Coherent Willingness to Engage in Self- High Management Activities: Readiness to Engage in Self- High Management Activities: Electronic Signature(s) Signed: 08/15/2016 5:45:48 PM By: Regan Lemming BSN, RN Entered By: Regan Lemming on 08/15/2016 08:58:15 Brad Taylor ([id number]) -------------------------------------------------------------------------------- Fall Risk Assessment Details Patient Name: Brad Taylor. Date of Service: 08/15/2016 9:00 AM Medical Record Number:  [id number] Patient Account Number: [id number] Date of Birth/Sex: 01-17-1951 (66 y.o. Male) Treating RN: Baruch Gouty, RN, BSN, [place] Primary Care Physician: Jenna Luo Other Clinician: Referring Physician: Jenna Luo Treating Physician/Extender: Frann Rider in Treatment: 0 Fall Risk Assessment Items Have you had 2 or more falls in the last 12 monthso 0 No Have you had any fall that resulted in injury in the last 12 monthso 0 No FALL RISK ASSESSMENT: History of falling - immediate or within 3 months 0 No Secondary diagnosis 0 No Ambulatory aid None/bed rest/wheelchair/nurse 0 Yes Crutches/cane/walker 0 No Furniture 0 No IV Access/Saline Lock 0 No  Gait/Training Normal/bed rest/immobile 0 Yes Weak 0 No Impaired 0 No Mental Status Oriented to own ability 0 Yes Electronic Signature(s) Signed: 08/15/2016 5:45:48 PM By: Regan Lemming BSN, RN Entered By: Regan Lemming on 08/15/2016 08:58:23 Brad Taylor ([id number]) -------------------------------------------------------------------------------- Foot Assessment Details Patient Name: Brad Taylor, Brad L. Date of Service: 08/15/2016 9:00 AM Medical Record Number: [id number] Patient Account Number: [id number] Date of Birth/Sex: 1950-12-10 (66 y.o. Male) Treating RN: Baruch Gouty, RN, BSN, Velva Harman Primary Care Physician: Jenna Luo Other Clinician: Referring Physician: Jenna Luo Treating Physician/Extender: Frann Rider in Treatment: 0 Foot Assessment Items Site Locations + = Sensation present, - = Sensation absent, C = Callus, U = Ulcer R = Redness, W = Warmth, M = Maceration, PU = Pre-ulcerative lesion F = Fissure, S = Swelling, D = Dryness Assessment Right: Left: Other Deformity: No No Prior Foot Ulcer: No No Prior Amputation: No No Charcot Joint: No No Ambulatory Status: Ambulatory Without Help Gait: Steady Electronic Signature(s) Signed: 08/15/2016 5:45:48 PM By: Regan Lemming BSN, RN Entered By: Regan Lemming on  08/15/2016 08:58:36 Brad Taylor ([id number]) -------------------------------------------------------------------------------- Nutrition Risk Assessment Details Patient Name: Brad Taylor. Date of Service: 08/15/2016 9:00 AM Medical Record Number: [id number] Patient Account Number: [id number] Date of Birth/Sex: 1950-10-17 (66 y.o. Male) Treating RN: Baruch Gouty, RN, BSN, Velva Harman Primary Care Physician: Jenna Luo Other Clinician: Referring Physician: Jenna Luo Treating Physician/Extender: Frann Rider in Treatment: 0 Height (in): Weight (lbs): Body Mass Index (BMI): Nutrition Risk Assessment Items NUTRITION RISK SCREEN: I have an illness or condition that made me change the kind and/or 0 No amount of food I eat I eat fewer than two meals per day 0 No I eat few fruits and vegetables, or milk products 0 No I have three or more drinks of beer, liquor or wine almost every day 0 No I have tooth or mouth problems that make it hard for me to eat 0 No I don't always have enough money to buy the food I need 0 No I eat alone most of the time 0 No I take three or more different prescribed or over-the-counter drugs a 0 No day Without wanting to, I have lost or gained 10 pounds in the last six 0 No months I am not always physically able to shop, cook and/or feed myself 0 No Nutrition Protocols Good Risk Protocol 0 No interventions needed Moderate Risk Protocol Electronic Signature(s) Signed: 08/15/2016 5:45:48 PM By: Regan Lemming BSN, RN Entered By: Regan Lemming on 08/15/2016 [id number]

## 2016-08-16 NOTE — Progress Notes (Signed)
Brad Taylor, Brad Taylor ([id number]) Visit Report for 08/15/2016 Chief Complaint Document Details Patient Name: Brad Taylor, Brad Taylor. Date of Service: 08/15/2016 9:00 AM Medical Record Number: [id number] Patient Account Number: [id number] Date of Birth/Sex: 03-10-1951 (66 y.o. Male) Treating RN: Baruch Gouty, RN, BSN, Velva Harman Primary Care Physician: Jenna Luo Other Clinician: Referring Physician: Jenna Luo Treating Physician/Extender: Frann Rider in Treatment: 0 Information Obtained from: Patient Chief Complaint Patient presents for treatment of an open ulcer due to venous insufficiency along with diabetes mellitus, to his right lower extremity which she's had for about 3 months Electronic Signature(s) Signed: 08/15/2016 9:57:00 AM By: Christin Fudge MD, FACS Entered By: Christin Fudge on 08/15/2016 09:57:00 Brad Taylor ([id number]) -------------------------------------------------------------------------------- Debridement Details Patient Name: Brad Taylor. Date of Service: 08/15/2016 9:00 AM Medical Record Number: [id number] Patient Account Number: [id number] Date of Birth/Sex: 1951/07/02 (66 y.o. Male) Treating RN: Afful, RN, BSN, [place] Primary Care Physician: Jenna Luo Other Clinician: Referring Physician: Jenna Luo Treating Physician/Extender: Frann Rider in Treatment: 0 Debridement Performed for Wound #1 Right,Lateral Lower Leg Assessment: Performed By: Physician Christin Fudge, MD Debridement: Debridement Pre-procedure Yes - 09:40 Verification/Time Out Taken: Start Time: 09:40 Pain Control: Lidocaine 4% Topical Solution Level: Skin/Subcutaneous Tissue Total Area Debrided (L x 6.2 (cm) x 3 (cm) = 18.6 (cm) W): Tissue and other Viable, Non-Viable, Fibrin/Slough, Subcutaneous material debrided: Instrument: Curette Bleeding: Minimum Hemostasis Achieved: Pressure End Time: 09:45 Procedural Pain: 3 Post Procedural Pain: 3 Response to Treatment:  Procedure was tolerated well Post Debridement Measurements of Total Wound Length: (cm) 6.2 Width: (cm) 3 Depth: (cm) 0.1 Volume: (cm) 1.461 Character of Wound/Ulcer Post Requires Further Debridement Debridement: Severity of Tissue Post Debridement: Fat layer exposed Post Procedure Diagnosis Same as Pre-procedure Electronic Signature(s) Signed: 08/15/2016 9:56:22 AM By: Christin Fudge MD, FACS Signed: 08/15/2016 5:45:48 PM By: Regan Lemming BSN, RN Entered By: Christin Fudge on 08/15/2016 09:56:22 Brad Taylor, Brad Taylor ([id number]) Brad Taylor, Brad Taylor ([id number]) -------------------------------------------------------------------------------- HPI Details Patient Name: Brad Taylor, Brad Taylor. Date of Service: 08/15/2016 9:00 AM Medical Record Number: [id number] Patient Account Number: [id number] Date of Birth/Sex: 10/30/1950 (66 y.o. Male) Treating RN: Baruch Gouty, RN, BSN, Velva Harman Primary Care Physician: Jenna Luo Other Clinician: Referring Physician: Jenna Luo Treating Physician/Extender: Frann Rider in Treatment: 0 History of Present Illness Location: right lower extremity Quality: Patient reports experiencing a dull pain to affected area(s). Severity: Patient states wound are getting worse. Duration: Patient has had the wound for > 3 months prior to seeking treatment at the wound center Timing: Pain in wound is Intermittent (comes and goes Context: The wound occurred when the patient had a lacerated wound which was treated with sutures which gaped and had an open ulceration Modifying Factors: Other treatment(s) tried include:been having on and Unna's boots applied by his PCP in the office Associated Signs and Symptoms: Patient reports having increase swelling. HPI Description: 66 year old gentleman with a past medical history of stroke, diabetes mellitus2 with hyperglycemia, CVA, hemiplegia, a phase ER, cellulitis of the left gluteal area and is a smoker and smokes a packet of  cigarettes a day. his past medical history also significant for COPD, thyroid disease and is status post cholecystectomy and status post knee surgery in October 2017 the patient had a laceration to his right lateral shin and had a deep tear which was sutured with 4-0 Ethilon sutures but these opened out a cause of his chronic venous insufficiency and severe edema and the patient was seen by his PCP and put on Bactrim. she was  also advised to do dressings with Silvadene and Unna's boots. We don't have notes from the [facility] in [place] but he says he's had a thorough workup done both arterial and venous and was told that he has venous insufficiency and was to wear compression stockings of 30-40 mm variety. We'll try and obtain these notes. He also gives a history of a lung mass which was a cancer and treated with resection but no chemotherapy or radiation Electronic Signature(s) Signed: 08/15/2016 9:58:45 AM By: Christin Fudge MD, FACS Previous Signature: 08/15/2016 9:53:22 AM Version By: Christin Fudge MD, FACS Previous Signature: 08/15/2016 9:32:11 AM Version By: Christin Fudge MD, FACS Previous Signature: 08/15/2016 9:25:01 AM Version By: Christin Fudge MD, FACS Entered By: Christin Fudge on 08/15/2016 09:58:45 Brad Taylor ([id number]) -------------------------------------------------------------------------------- Physical Exam Details Patient Name: Brad Taylor, Brad L. Date of Service: 08/15/2016 9:00 AM Medical Record Number: [id number] Patient Account Number: [id number] Date of Birth/Sex: 1951-01-30 (66 y.o. Male) Treating RN: Baruch Gouty, RN, BSN, Velva Harman Primary Care Physician: Jenna Luo Other Clinician: Referring Physician: Jenna Luo Treating Physician/Extender: Frann Rider in Treatment: 0 Constitutional . Pulse regular. Respirations normal and unlabored. Afebrile. . Eyes Nonicteric. Reactive to light. Ears, Nose, Mouth, and Throat Lips, teeth, and gums WNL.Marland Kitchen Moist  mucosa without lesions. Neck supple and nontender. No palpable supraclavicular or cervical adenopathy. Normal sized without goiter. Respiratory WNL. No retractions.. Cardiovascular Pedal Pulses WNL. ABI could not be measured. No clubbing, cyanosis or edema. Chest Breasts symmetical and no nipple discharge.. Breast tissue WNL, no masses, lumps, or tenderness.. Gastrointestinal (GI) Abdomen without masses or tenderness.. No liver or spleen enlargement or tenderness.. Lymphatic No adneopathy. No adenopathy. No adenopathy. Musculoskeletal Adexa without tenderness or enlargement.. Digits and nails w/o clubbing, cyanosis, infection, petechiae, ischemia, or inflammatory conditions.. Integumentary (Hair, Skin) No suspicious lesions. No crepitus or fluctuance. No peri-wound warmth or erythema. No masses.Marland Kitchen Psychiatric Judgement and insight Intact.. No evidence of depression, anxiety, or agitation.. Notes he has stigmata of venous insufficiency on the right lower extremity with a large lacerated open wound which now could be just due to his venous insufficiency. The wound has significant amount of slough which was sharply debrided with a #3 curet and bleeding controlled with pressure Electronic Signature(s) Signed: 08/15/2016 9:59:42 AM By: Christin Fudge MD, FACS Entered By: Christin Fudge on 08/15/2016 09:59:41 Brad Taylor, Brad Taylor ([id number]) Brad Taylor, Brad Taylor ([id number]) -------------------------------------------------------------------------------- Physician Orders Details Patient Name: Brad Taylor, TURI. Date of Service: 08/15/2016 9:00 AM Medical Record Number: [id number] Patient Account Number: [id number] Date of Birth/Sex: February 09, 1951 (66 y.o. Male) Treating RN: Baruch Gouty, RN, BSN, Velva Harman Primary Care Physician: Jenna Luo Other Clinician: Referring Physician: Jenna Luo Treating Physician/Extender: Frann Rider in Treatment: 0 Verbal / Phone Orders: Yes Clinician: Afful, RN,  BSN, Rita Read Back and Verified: Yes Diagnosis Coding Wound Cleansing Wound #1 Right,Lateral Lower Leg o Cleanse wound with mild soap and water - in clinic Anesthetic Wound #1 Right,Lateral Lower Leg o Topical Lidocaine 4% cream applied to wound bed prior to debridement Skin Barriers/Peri-Wound Care Wound #1 Right,Lateral Lower Leg o Barrier cream Primary Wound Dressing Wound #1 Right,Lateral Lower Leg o Hydrafera Blue Secondary Dressing Wound #1 Right,Lateral Lower Leg o ABD pad Dressing Change Frequency Wound #1 Right,Lateral Lower Leg o Change dressing every week Follow-up Appointments Wound #1 Right,Lateral Lower Leg o Return Appointment in 1 week. Edema Control Wound #1 Right,Lateral Lower Leg o Elevate legs to the level of the heart and pump ankles as often as possible o Other: -  Kerlix and Coban Additional Orders / Instructions Brad Taylor, Brad Taylor ([id number]) Wound #1 Right,Lateral Lower Leg o Stop Smoking o Increase protein intake. o Activity as tolerated o Other: - ZINC, VIT A, VIT C, MVI INCLUDE THESE IN YOU DIET Notes [facility] Electronic Signature(s) Signed: 08/15/2016 3:54:10 PM By: Christin Fudge MD, FACS Signed: 08/15/2016 5:45:48 PM By: Regan Lemming BSN, RN Entered By: Regan Lemming on 08/15/2016 09:45:27 Brad Taylor ([id number]) -------------------------------------------------------------------------------- Problem List Details Patient Name: INAKI, SKYLES. Date of Service: 08/15/2016 9:00 AM Medical Record Number: [id number] Patient Account Number: [id number] Date of Birth/Sex: 12-Nov-1950 (66 y.o. Male) Treating RN: Baruch Gouty, RN, BSN, Velva Harman Primary Care Physician: Jenna Luo Other Clinician: Referring Physician: Jenna Luo Treating Physician/Extender: Frann Rider in Treatment: 0 Active Problems ICD-10 Encounter Code Description Active Date Diagnosis E11.622 Type 2 diabetes mellitus  with other skin ulcer 08/15/2016 Yes L97.212 Non-pressure chronic ulcer of right calf with fat layer 08/15/2016 Yes exposed I87.2 Venous insufficiency (chronic) (peripheral) 08/15/2016 Yes F17.218 Nicotine dependence, cigarettes, with other nicotine- 08/15/2016 Yes induced disorders Inactive Problems Resolved Problems Electronic Signature(s) Signed: 08/15/2016 9:56:04 AM By: Christin Fudge MD, FACS Entered By: Christin Fudge on 08/15/2016 09:56:03 Brad Taylor ([id number]) -------------------------------------------------------------------------------- Progress Note Details Patient Name: Brad Taylor. Date of Service: 08/15/2016 9:00 AM Medical Record Number: [id number] Patient Account Number: [id number] Date of Birth/Sex: 1951-04-22 (66 y.o. Male) Treating RN: Baruch Gouty, RN, BSN, Velva Harman Primary Care Physician: Jenna Luo Other Clinician: Referring Physician: Jenna Luo Treating Physician/Extender: Frann Rider in Treatment: 0 Subjective Chief Complaint Information obtained from Patient Patient presents for treatment of an open ulcer due to venous insufficiency along with diabetes mellitus, to his right lower extremity which she's had for about 3 months History of Present Illness (HPI) The following HPI elements were documented for the patient's wound: Location: right lower extremity Quality: Patient reports experiencing a dull pain to affected area(s). Severity: Patient states wound are getting worse. Duration: Patient has had the wound for > 3 months prior to seeking treatment at the wound center Timing: Pain in wound is Intermittent (comes and goes Context: The wound occurred when the patient had a lacerated wound which was treated with sutures which gaped and had an open ulceration Modifying Factors: Other treatment(s) tried include:been having on and Unna's boots applied by his PCP in the office Associated Signs and Symptoms: Patient reports having increase  swelling. 66 year old gentleman with a past medical history of stroke, diabetes mellitus2 with hyperglycemia, CVA, hemiplegia, a phase ER, cellulitis of the left gluteal area and is a smoker and smokes a packet of cigarettes a day. his past medical history also significant for COPD, thyroid disease and is status post cholecystectomy and status post knee surgery in October 2017 the patient had a laceration to his right lateral shin and had a deep tear which was sutured with 4-0 Ethilon sutures but these opened out a cause of his chronic venous insufficiency and severe edema and the patient was seen by his PCP and put on Bactrim. she was also advised to do dressings with Silvadene and Unna's boots. We don't have notes from the [facility] in [place] but he says he's had a thorough workup done both arterial and venous and was told that he has venous insufficiency and was to wear compression stockings of 30-40 mm variety. We'll try and obtain these notes. He also gives a history of a lung mass which was a cancer and treated with resection but no  chemotherapy or radiation Wound History Patient presents with 1 open wound that has been present for approximately since october. Patient has been treating wound in the following manner: unna boot. ssd. Laboratory tests have not been performed in the last month. Patient reportedly has not tested positive for an antibiotic resistant organism. Patient reportedly has not tested positive for osteomyelitis. Patient reportedly has not had testing performed to [place] ([id number]) evaluate circulation in the legs. Patient History Information obtained from Patient. Allergies no known allergies Family History Cancer - Mother, Kidney Disease - Maternal Grandparents, Thyroid Problems - Siblings, Paternal Grandparents, Maternal Grandparents, Father, No family history of Diabetes, Heart Disease, Hereditary Spherocytosis, Hypertension, Lung  Disease, Seizures, Stroke, Tuberculosis. Social History Never smoker, Marital Status - Married, Alcohol Use - Rarely, Drug Use - No History, Caffeine Use - Moderate. Medical History Eyes Denies history of Cataracts, Glaucoma, Optic Neuritis Ear/Nose/Mouth/Throat Denies history of Chronic sinus problems/congestion, Middle ear problems Hematologic/Lymphatic Denies history of Anemia, Hemophilia, Human Immunodeficiency Virus, Lymphedema, Sickle Cell Disease Respiratory Patient has history of Chronic Obstructive Pulmonary Disease (COPD) Denies history of Aspiration, Asthma, Pneumothorax, Sleep Apnea Cardiovascular Patient has history of Arrhythmia, Hypertension, Myocardial Infarction Gastrointestinal Denies history of Cirrhosis , Colitis, Crohn s, Hepatitis A, Hepatitis B, Hepatitis C Endocrine Patient has history of Type II Diabetes Denies history of Type I Diabetes Genitourinary Denies history of End Stage Renal Disease Immunological Denies history of Lupus Erythematosus, Raynaud s, Scleroderma Musculoskeletal Denies history of Gout, Rheumatoid Arthritis, Osteoarthritis, Osteomyelitis Neurologic Denies history of Dementia, Neuropathy, Quadriplegia, Paraplegia, Seizure Disorder Oncologic Denies history of Received Chemotherapy, Received Radiation Psychiatric Denies history of Anorexia/bulimia, Confinement Anxiety Patient is treated with Insulin, Oral Agents. Blood sugar is tested. Blood sugar results noted at the following times: Breakfast - 100. GLENDLE, AUVIL ([id number]) Medical And Surgical History Notes Endocrine hypothyroidism Oncologic 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 no further intervention Review of Systems (ROS) Constitutional Symptoms (General Health) The patient has no complaints or symptoms. Eyes The patient has no complaints or symptoms. Ear/Nose/Mouth/Throat The patient has no complaints or symptoms. Hematologic/Lymphatic The patient has no  complaints or symptoms. Respiratory The patient has no complaints or symptoms. Cardiovascular Complains or has symptoms of LE edema. Gastrointestinal The patient has no complaints or symptoms. Endocrine The patient has no complaints or symptoms. Genitourinary The patient has no complaints or symptoms. Immunological The patient has no complaints or symptoms. Integumentary (Skin) Complains or has symptoms of Wounds, Breakdown, Swelling. Musculoskeletal The patient has no complaints or symptoms. Neurologic The patient has no complaints or symptoms. Oncologic The patient has no complaints or symptoms. Psychiatric The patient has no complaints or symptoms. Medications: I have reviewed his list of medications which include Lipitor, Wellbutrin, Plavix, Lantus insulin, Synthroid, Zofran, AcipHex, Silvadene, Effexor XR and Desyrel Brad Taylor, Macarius L. ([id number]) Objective Constitutional Pulse regular. Respirations normal and unlabored. Afebrile. Vitals Time Taken: 8:59 AM, Temperature: 97.8 F, Pulse: 72 bpm, Respiratory Rate: 18 breaths/min, Blood Pressure: 150/85 mmHg. Eyes Nonicteric. Reactive to light. Ears, Nose, Mouth, and Throat Lips, teeth, and gums WNL.Marland Kitchen Moist mucosa without lesions. Neck supple and nontender. No palpable supraclavicular or cervical adenopathy. Normal sized without goiter. Respiratory WNL. No retractions.. Cardiovascular Pedal Pulses WNL. ABI could not be measured. No clubbing, cyanosis or edema. Chest Breasts symmetical and no nipple discharge.. Breast tissue WNL, no masses, lumps, or tenderness.. Gastrointestinal (GI) Abdomen without masses or tenderness.. No liver or spleen enlargement or tenderness.. Lymphatic No adneopathy. No adenopathy. No adenopathy. Musculoskeletal Adexa  without tenderness or enlargement.. Digits and nails w/o clubbing, cyanosis, infection, petechiae, ischemia, or inflammatory conditions.Marland Kitchen Psychiatric Judgement and insight  Intact.. No evidence of depression, anxiety, or agitation.. General Notes: he has stigmata of venous insufficiency on the right lower extremity with a large lacerated open wound which now could be just due to his venous insufficiency. The wound has significant amount of slough which was sharply debrided with a #3 curet and bleeding controlled with pressure Haze, Telesforo L. ([id number]) Integumentary (Hair, Skin) No suspicious lesions. No crepitus or fluctuance. No peri-wound warmth or erythema. No masses.. Wound #1 status is Open. Original cause of wound was Trauma. The wound is located on the Right,Lateral Lower Leg. The wound measures 6.2cm length x 3cm width x 0.1cm depth; 14.608cm^2 area and 1.461cm^3 volume. The wound is limited to skin breakdown. There is no tunneling or undermining noted. There is a medium amount of serosanguineous drainage noted. The wound margin is flat and intact. There is small (1-33%) pink, pale granulation within the wound bed. There is a large (67-100%) amount of necrotic tissue within the wound bed including Adherent Slough. The periwound skin appearance exhibited: Localized Edema, Moist, Hemosiderin Staining, Mottled. The periwound skin appearance did not exhibit: Callus, Crepitus, Excoriation, Fluctuance, Friable, Induration, Rash, Scarring, Dry/Scaly, Maceration, Atrophie Blanche, Cyanosis, Ecchymosis, Pallor, Rubor, Erythema. Periwound temperature was noted as No Abnormality. The periwound has tenderness on palpation. Assessment Active Problems ICD-10 E11.622 - Type 2 diabetes mellitus with other skin ulcer L97.212 - Non-pressure chronic ulcer of right calf with fat layer exposed I87.2 - Venous insufficiency (chronic) (peripheral) F17.218 - Nicotine dependence, cigarettes, with other nicotine-induced disorders 67 year old gentleman with diabetes and hyperglycemia has not been regular with his PCP and I understand his last hemoglobin A1c may have been  8.4. He has had a workup done at the [facility] which is suggestive of venous insufficiency and he's been treated with compression stockings for a long while and his PCP has been using on Unna's boots recently. After review I have recommended: 1. Review of his arterial and venous duplex studies from the [place] him to try and get these reports ASAP 2. Hydrofera Blue with a 2 layer compression with Kerlix and Coban 3. Good control of his diabetes mellitus and I have asked him to see his PCP for further care 4. I have spent 3 minutes discussing with them the need to completely give up smoking and I discussed the risk benefits and alternatives especially all the possibilities of improved wound healing 5. elevation and exercise 6. Adequate protein, vitamin A, vitamin C and zinc 7. Regular visits to the wound center [place], [place] ([id number]) he and his wife have read all questions answered and will be compliant Procedures Wound #1 Wound #1 is a Diabetic Wound/Ulcer of the Lower Extremity located on the Right,Lateral Lower Leg . There was a Skin/Subcutaneous Tissue Debridement [id number]) debridement with total area of 18.6 sq cm performed by Christin Fudge, MD. with the following instrument(s): Curette to remove Viable and Non-Viable tissue/material including Fibrin/Slough and Subcutaneous after achieving pain control using Lidocaine 4% Topical Solution. A time out was conducted at 09:40, prior to the start of the procedure. A Minimum amount of bleeding was controlled with Pressure. The procedure was tolerated well with a pain level of 3 throughout and a pain level of 3 following the procedure. Post Debridement Measurements: 6.2cm length x 3cm width x 0.1cm depth; 1.461cm^3 volume. Character of Wound/Ulcer Post Debridement requires further debridement. Severity of Tissue  Post Debridement is: Fat layer exposed. Post procedure Diagnosis Wound #1: Same as Pre-Procedure Plan Wound  Cleansing: Wound #1 Right,Lateral Lower Leg: Cleanse wound with mild soap and water - in clinic Anesthetic: Wound #1 Right,Lateral Lower Leg: Topical Lidocaine 4% cream applied to wound bed prior to debridement Skin Barriers/Peri-Wound Care: Wound #1 Right,Lateral Lower Leg: Barrier cream Primary Wound Dressing: Wound #1 Right,Lateral Lower Leg: Hydrafera Blue Secondary Dressing: Wound #1 Right,Lateral Lower Leg: ABD pad Dressing Change Frequency: Wound #1 Right,Lateral Lower Leg: Change dressing every week Follow-up Appointments: Wound #1 Right,Lateral Lower Leg: Return Appointment in 1 week. Edema Control: Wound #1 Right,Lateral Lower Leg: Elevate legs to the level of the heart and pump ankles as often as possible Other: - Kerlix and Coban Harwood, Cloyde L. ([id number]) Additional Orders / Instructions: Wound #1 Right,Lateral Lower Leg: Stop Smoking Increase protein intake. Activity as tolerated Other: - ZINC, VIT A, VIT C, MVI INCLUDE THESE IN YOU DIET General Notes: GET VASCULAR REPORT FROM [place] 66 year old gentleman with diabetes and hyperglycemia has not been regular with his PCP and I understand his last hemoglobin A1c may have been 8.4. He has had a workup done at the [facility] which is suggestive of venous insufficiency and he's been treated with compression stockings for a long while and his PCP has been using on Unna's boots recently. After review I have recommended: 1. Review of his arterial and venous duplex studies from the [place] him to try and get these reports ASAP 2. Hydrofera Blue with a 2 layer compression with Kerlix and Coban 3. Good control of his diabetes mellitus and I have asked him to see his PCP for further care 4. I have spent 3 minutes discussing with them the need to completely give up smoking and I discussed the risk benefits and alternatives especially all the possibilities of improved wound healing 5. elevation and exercise 6. Adequate protein,  vitamin A, vitamin C and zinc 7. Regular visits to the wound center he and his wife have read all questions answered and will be compliant Electronic Signature(s) Signed: 08/15/2016 10:03:27 AM By: Christin Fudge MD, FACS Entered By: Christin Fudge on 08/15/2016 10:03:26 Brad Taylor ([id number]) -------------------------------------------------------------------------------- ROS/PFSH Details Patient Name: TRAYON, KANIA L. Date of Service: 08/15/2016 9:00 AM Medical Record Number: [id number] Patient Account Number: [id number] Date of Birth/Sex: [birth year] (66 y.o. Male) Treating RN: Baruch Gouty, RN, BSN, [place] Primary Care Physician: Jenna Luo Other Clinician: Referring Physician: Jenna Luo Treating Physician/Extender: Frann Rider in Treatment: 0 Information Obtained From Patient Wound History Do you currently have one or more open woundso Yes How many open wounds do you currently haveo 1 Approximately how long have you had your woundso since october How have you been treating your wound(s) until nowo unna boot. ssd Has your wound(s) ever healed and then re-openedo No Have you had any lab work done in the past montho No Have you tested positive for an antibiotic resistant organism (MRSA, VRE)o No Have you tested positive for osteomyelitis (bone infection)o No Have you had any tests for circulation on your legso No Cardiovascular Complaints and Symptoms: Positive for: LE edema Medical History: Positive for: Arrhythmia; Hypertension; Myocardial Infarction Integumentary (Skin) Complaints and Symptoms: Positive for: Wounds; Breakdown; Swelling Constitutional Symptoms (General Health) Complaints and Symptoms: No Complaints or Symptoms Eyes Complaints and Symptoms: No Complaints or Symptoms Medical History: Negative for: Cataracts; Glaucoma; Optic Neuritis Ear/Nose/Mouth/Throat Complaints and Symptoms: No Complaints or Symptoms EDIEL, JANUS.  ([id number]) Medical History: Negative for:  Chronic sinus problems/congestion; Middle ear problems Hematologic/Lymphatic Complaints and Symptoms: No Complaints or Symptoms Medical History: Negative for: Anemia; Hemophilia; Human Immunodeficiency Virus; Lymphedema; Sickle Cell Disease Respiratory Complaints and Symptoms: No Complaints or Symptoms Medical History: Positive for: Chronic Obstructive Pulmonary Disease (COPD) Negative for: Aspiration; Asthma; Pneumothorax; Sleep Apnea Gastrointestinal Complaints and Symptoms: No Complaints or Symptoms Medical History: Negative for: Cirrhosis ; Colitis; Crohnos; Hepatitis A; Hepatitis B; Hepatitis C Endocrine Complaints and Symptoms: No Complaints or Symptoms Medical History: Positive for: Type II Diabetes Negative for: Type I Diabetes Past Medical History Notes: hypothyroidism Time with diabetes: 10 years Treated with: Insulin, Oral agents Blood sugar tested every day: Yes Tested : Blood sugar testing results: Breakfast: 100 Genitourinary Complaints and Symptoms: No Complaints or Symptoms Medical History: QUINTELL, MATTERS ([id number]) Negative for: End Stage Renal Disease Immunological Complaints and Symptoms: No Complaints or Symptoms Medical History: Negative for: Lupus Erythematosus; Raynaudos; Scleroderma Musculoskeletal Complaints and Symptoms: No Complaints or Symptoms Medical History: Negative for: Gout; Rheumatoid Arthritis; Osteoarthritis; Osteomyelitis Neurologic Complaints and Symptoms: No Complaints or Symptoms Medical History: Negative for: Dementia; Neuropathy; Quadriplegia; Paraplegia; Seizure Disorder Oncologic Complaints and Symptoms: No Complaints or Symptoms Medical History: Negative for: Received Chemotherapy; Received Radiation Past Medical History Notes: 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 no further intervention Psychiatric Complaints and Symptoms: No Complaints or Symptoms Medical  History: Negative for: Anorexia/bulimia; Confinement Anxiety Family and Social History Cancer: Yes - Mother; Diabetes: No; Heart Disease: No; Hereditary Spherocytosis: No; Hypertension: No; Kidney Disease: Yes - Maternal Grandparents; Lung Disease: No; Seizures: No; Stroke: No; Thyroid Problems: Yes - Siblings, Paternal Grandparents, Maternal Grandparents, Father; Tuberculosis: No; Never smoker; Marital Status - Married; Alcohol Use: Rarely; Drug Use: No History; Caffeine Use: Moderate; Financial Concerns: No; Food, Games developer or Shelter Needs: No; Support System Lacking: No; Transportation [place], [place] ([id number]) Concerns: No; Advanced Directives: No; Patient does not want information on Advanced Directives; Living Will: No Physician Affirmation I have reviewed and agree with the above information. Electronic Signature(s) Signed: 08/15/2016 3:54:10 PM By: Christin Fudge MD, FACS Signed: 08/15/2016 5:45:48 PM By: Regan Lemming BSN, RN Entered By: Christin Fudge on 08/15/2016 09:22:49 Brad Taylor ([id number]) -------------------------------------------------------------------------------- SuperBill Details Patient Name: LORIN, SCHLIE. Date of Service: 08/15/2016 Medical Record Number: [id number] Patient Account Number: [id number] Date of Birth/Sex: 10/31/1950 (66 y.o. Male) Treating RN: Baruch Gouty, RN, BSN, [place] Primary Care Physician: Jenna Luo Other Clinician: Referring Physician: Jenna Luo Treating Physician/Extender: Frann Rider in Treatment: 0 Diagnosis Coding ICD-10 Codes Code Description [phone] Type 2 diabetes mellitus with other skin ulcer L97.212 Non-pressure chronic ulcer of right calf with fat layer exposed I87.2 Venous insufficiency (chronic) (peripheral) F17.218 Nicotine dependence, cigarettes, with other nicotine-induced disorders Facility Procedures CPT4 Code Description: [id number] - WOUND CARE VISIT-LEV 3 EST PT Modifier: Quantity:  1 CPT4 Code Description: [id number] - DEB SUBQ TISSUE 20 SQ CM/< ICD-10 Description Diagnosis E11.622 Type 2 diabetes mellitus with other skin ulcer L97.212 Non-pressure chronic ulcer of right calf with fat la I87.2 Venous insufficiency (chronic)  (peripheral) Modifier: yer exposed Quantity: 1 CPT4 Code Description: [id number]-[id number] CESSATION 3-10MINS ICD-10 Description Diagnosis E11.622 Type 2 diabetes mellitus with other skin ulcer F17.218 Nicotine dependence, cigarettes, with other nicotine Modifier: -induced disor Quantity: 1 ders Physician Procedures CPT4 Code Description: [id number] - WC PHYS LEVEL 4 - NEW PT ICD-10 Description Diagnosis E11.622 Type 2 diabetes mellitus with other skin ulcer L97.212 Non-pressure chronic ulcer of right calf with fat l I87.2 Venous insufficiency (chronic)  (  peripheral) F17.218 Nicotine dependence, cigarettes, with other nicotin ANIKIN, LEDIN ([id number]) Modifier: 25 ayer exposed e-induced disor Quantity: 1 ders Electronic Signature(s) Signed: 08/15/2016 10:04:11 AM By: Christin Fudge MD, FACS Entered By: Christin Fudge on 08/15/2016 10:04:11

## 2016-08-22 ENCOUNTER — Ambulatory Visit: Payer: Commercial Managed Care - HMO | Admitting: Surgery

## 2016-08-29 ENCOUNTER — Encounter: Payer: Commercial Managed Care - HMO | Admitting: Surgery

## 2016-08-29 DIAGNOSIS — I10 Essential (primary) hypertension: Secondary | ICD-10-CM | POA: Diagnosis not present

## 2016-08-29 DIAGNOSIS — F17218 Nicotine dependence, cigarettes, with other nicotine-induced disorders: Secondary | ICD-10-CM | POA: Diagnosis not present

## 2016-08-29 DIAGNOSIS — I872 Venous insufficiency (chronic) (peripheral): Secondary | ICD-10-CM | POA: Diagnosis not present

## 2016-08-29 DIAGNOSIS — L97212 Non-pressure chronic ulcer of right calf with fat layer exposed: Secondary | ICD-10-CM | POA: Diagnosis not present

## 2016-08-29 DIAGNOSIS — J449 Chronic obstructive pulmonary disease, unspecified: Secondary | ICD-10-CM | POA: Diagnosis not present

## 2016-08-29 DIAGNOSIS — Z8673 Personal history of transient ischemic attack (TIA), and cerebral infarction without residual deficits: Secondary | ICD-10-CM | POA: Diagnosis not present

## 2016-08-29 DIAGNOSIS — E039 Hypothyroidism, unspecified: Secondary | ICD-10-CM | POA: Diagnosis not present

## 2016-08-29 DIAGNOSIS — E11622 Type 2 diabetes mellitus with other skin ulcer: Secondary | ICD-10-CM | POA: Diagnosis not present

## 2016-08-29 DIAGNOSIS — I252 Old myocardial infarction: Secondary | ICD-10-CM | POA: Diagnosis not present

## 2016-08-30 NOTE — Progress Notes (Signed)
EULOGIO, RAIKES ([id number]) Visit Report for 08/29/2016 Chief Complaint Document Details Patient Name: Brad Taylor, Brad Taylor. Date of Service: 08/29/2016 11:15 AM Medical Record Number: [id number] Patient Account Number: [id number] Date of Birth/Sex: 1950-12-03 (66 y.o. Male) Treating RN: Baruch Gouty, RN, BSN, [place] Primary Care Provider: Jenna Luo Other Clinician: Referring Provider: Jenna Luo Treating Provider/Extender: Frann Rider in Treatment: 2 Information Obtained from: Patient Chief Complaint Patient presents for treatment of an open ulcer due to venous insufficiency along with diabetes mellitus, to his right lower extremity which she's had for about 3 months Electronic Signature(s) Signed: 08/29/2016 11:50:34 AM By: Christin Fudge MD, FACS Entered By: Christin Fudge on 08/29/2016 11:50:34 Brad Taylor ([id number]) -------------------------------------------------------------------------------- HPI Details Patient Name: Brad Taylor. Date of Service: 08/29/2016 11:15 AM Medical Record Number: [id number] Patient Account Number: [id number] Date of Birth/Sex: 11-Nov-1950 (66 y.o. Male) Treating RN: Baruch Gouty, RN, BSN, [place] Primary Care Provider: Jenna Luo Other Clinician: Referring Provider: Jenna Luo Treating Provider/Extender: Frann Rider in Treatment: 2 History of Present Illness Location: right lower extremity Quality: Patient reports experiencing a dull pain to affected area(s). Severity: Patient states wound are getting worse. Duration: Patient has had the wound for > 3 months prior to seeking treatment at the wound center Timing: Pain in wound is Intermittent (comes and goes Context: The wound occurred when the patient had a lacerated wound which was treated with sutures which gaped and had an open ulceration Modifying Factors: Other treatment(s) tried include:been having on and Unna's boots applied by his PCP in the office Associated Signs  and Symptoms: Patient reports having increase swelling. HPI Description: 66 year old gentleman with a past medical history of stroke, diabetes mellitus2 with hyperglycemia, CVA, hemiplegia, a phase ER, cellulitis of the left gluteal area and is a smoker and smokes a packet of cigarettes a day. his past medical history also significant for COPD, thyroid disease and is status post cholecystectomy and status post knee surgery in October 2017 the patient had a laceration to his right lateral shin and had a deep tear which was sutured with 4-0 Ethilon sutures but these opened out a cause of his chronic venous insufficiency and severe edema and the patient was seen by his PCP and put on Bactrim. she was also advised to do dressings with Silvadene and Unna's boots. We don't have notes from the [facility] in [place] but he says he's had a thorough workup done both arterial and venous and was told that he has venous insufficiency and was to wear compression stockings of 30-40 mm variety. We'll try and obtain these notes. He also gives a history of a lung mass which was a cancer and treated with resection but no chemotherapy or radiation. 08/29/2016 -- not yet received any of his notes from the [facility] Signature(s) Signed: 08/29/2016 11:50:59 AM By: Christin Fudge MD, FACS Entered By: Christin Fudge on 08/29/2016 11:50:59 Brad Taylor ([id number]) -------------------------------------------------------------------------------- Physical Exam Details Patient Name: Brad Taylor, ELA. Date of Service: 08/29/2016 11:15 AM Medical Record Number: [id number] Patient Account Number: [id number] Date of Birth/Sex: November 24, 1950 (66 y.o. Male) Treating RN: Baruch Gouty, RN, BSN, [place] Harman Primary Care Provider: Jenna Luo Other Clinician: Referring Provider: Jenna Luo Treating Provider/Extender: Frann Rider in Treatment: 2 Constitutional . Pulse regular. Respirations normal and unlabored.  Afebrile. . Eyes Nonicteric. Reactive to light. Ears, Nose, Mouth, and Throat Lips, teeth, and gums WNL.Marland Kitchen Moist mucosa without lesions. Neck supple and nontender. No palpable supraclavicular or cervical adenopathy. Normal sized without goiter.  Respiratory WNL. No retractions.. Breath sounds WNL, No rubs, rales, rhonchi, or wheeze.. Chest Breasts symmetical and no nipple discharge.. Breast tissue WNL, no masses, lumps, or tenderness.. Lymphatic No adneopathy. No adenopathy. No adenopathy. Musculoskeletal Adexa without tenderness or enlargement.. Digits and nails w/o clubbing, cyanosis, infection, petechiae, ischemia, or inflammatory conditions.. Integumentary (Hair, Skin) No suspicious lesions. No crepitus or fluctuance. No peri-wound warmth or erythema. No masses.Marland Kitchen Psychiatric Judgement and insight Intact.. No evidence of depression, anxiety, or agitation.. Notes none need sharp debridement today and the wound has clean healthy granulation tissue with minimal lymphedema persisting Electronic Signature(s) Signed: 08/29/2016 11:51:35 AM By: Christin Fudge MD, FACS Entered By: Christin Fudge on 08/29/2016 11:51:35 Brad Taylor ([id number]) -------------------------------------------------------------------------------- Physician Orders Details Patient Name: Brad Taylor, Brad L. Date of Service: 08/29/2016 11:15 AM Medical Record Number: [id number] Patient Account Number: [id number] Date of Birth/Sex: [birth year] (66 y.o. Male) Treating RN: Cornell Barman Primary Care Provider: Jenna Luo Other Clinician: Referring Provider: Jenna Luo Treating Provider/Extender: Frann Rider in Treatment: 2 Verbal / Phone Orders: No Diagnosis Coding Wound Cleansing Wound #1 Right,Lateral Lower Leg o Cleanse wound with mild soap and water - in clinic Anesthetic Wound #1 Right,Lateral Lower Leg o Topical Lidocaine 4% cream applied to wound bed prior to debridement Primary Wound  Dressing Wound #1 Right,Lateral Lower Leg o Mepitel One Contact layer o Hydrafera Blue Secondary Dressing Wound #1 Right,Lateral Lower Leg o ABD pad Dressing Change Frequency Wound #1 Right,Lateral Lower Leg o Change dressing every week Follow-up Appointments Wound #1 Right,Lateral Lower Leg o Return Appointment in 1 week. Edema Control Wound #1 Right,Lateral Lower Leg o Kerlix and Coban - Right Lower Extremity o Elevate legs to the level of the heart and pump ankles as often as possible Additional Orders / Instructions Wound #1 Right,Lateral Lower Leg o Stop Smoking o Increase protein intake. o Activity as tolerated Brad Taylor, Brad Taylor ([id number]) o Other: - ZINC, VIT A, VIT C, MVI INCLUDE THESE IN YOU DIET Electronic Signature(s) Signed: 08/29/2016 3:30:03 PM By: Christin Fudge MD, FACS Signed: 08/29/2016 3:30:03 PM By: Gretta Cool RN, BSN, Kim RN, BSN Entered By: Gretta Cool, RN, BSN, Kim on 08/29/2016 11:44:10 Brad Taylor ([id number]) -------------------------------------------------------------------------------- Problem List Details Patient Name: Brad Taylor, Brad Taylor. Date of Service: 08/29/2016 11:15 AM Medical Record Number: [id number] Patient Account Number: [id number] Date of Birth/Sex: Dec 22, 1950 (66 y.o. Male) Treating RN: Baruch Gouty, RN, BSN, [place] Primary Care Provider: Jenna Luo Other Clinician: Referring Provider: Jenna Luo Treating Provider/Extender: Frann Rider in Treatment: 2 Active Problems ICD-10 Encounter Code Description Active Date Diagnosis E11.622 Type 2 diabetes mellitus with other skin ulcer 08/15/2016 Yes L97.212 Non-pressure chronic ulcer of right calf with fat layer 08/15/2016 Yes exposed I87.2 Venous insufficiency (chronic) (peripheral) 08/15/2016 Yes F17.218 Nicotine dependence, cigarettes, with other nicotine- 08/15/2016 Yes induced disorders Inactive Problems Resolved Problems Electronic Signature(s) Signed:  08/29/2016 11:50:23 AM By: Christin Fudge MD, FACS Entered By: Christin Fudge on 08/29/2016 11:50:23 Brad Taylor ([id number]) -------------------------------------------------------------------------------- Progress Note Details Patient Name: Brad Taylor. Date of Service: 08/29/2016 11:15 AM Medical Record Number: [id number] Patient Account Number: [id number] Date of Birth/Sex: 15-Nov-1950 (66 y.o. Male) Treating RN: Baruch Gouty, RN, BSN, [place] Harman Primary Care Provider: Jenna Luo Other Clinician: Referring Provider: Jenna Luo Treating Provider/Extender: Frann Rider in Treatment: 2 Subjective Chief Complaint Information obtained from Patient Patient presents for treatment of an open ulcer due to venous insufficiency along with diabetes mellitus, to his right lower extremity which she's had for about 3 months History of  Present Illness (HPI) The following HPI elements were documented for the patient's wound: Location: right lower extremity Quality: Patient reports experiencing a dull pain to affected area(s). Severity: Patient states wound are getting worse. Duration: Patient has had the wound for > 3 months prior to seeking treatment at the wound center Timing: Pain in wound is Intermittent (comes and goes Context: The wound occurred when the patient had a lacerated wound which was treated with sutures which gaped and had an open ulceration Modifying Factors: Other treatment(s) tried include:been having on and Unna's boots applied by his PCP in the office Associated Signs and Symptoms: Patient reports having increase swelling. 66 year old gentleman with a past medical history of stroke, diabetes mellitus2 with hyperglycemia, CVA, hemiplegia, a phase ER, cellulitis of the left gluteal area and is a smoker and smokes a packet of cigarettes a day. his past medical history also significant for COPD, thyroid disease and is status post cholecystectomy and status post knee  surgery in October 2017 the patient had a laceration to his right lateral shin and had a deep tear which was sutured with 4-0 Ethilon sutures but these opened out a cause of his chronic venous insufficiency and severe edema and the patient was seen by his PCP and put on Bactrim. she was also advised to do dressings with Silvadene and Unna's boots. We don't have notes from the [facility] in [place] but he says he's had a thorough workup done both arterial and venous and was told that he has venous insufficiency and was to wear compression stockings of 30-40 mm variety. We'll try and obtain these notes. He also gives a history of a lung mass which was a cancer and treated with resection but no chemotherapy or radiation. 08/29/2016 -- not yet received any of his notes from the [facility] ([id number]) Objective Constitutional Pulse regular. Respirations normal and unlabored. Afebrile. Vitals Time Taken: 11:02 AM, Height: 72 in, Source: Measured, Weight: 250 lbs, Source: Measured, BMI: 33.9, Temperature: 97.5 F, Pulse: 62 bpm, Respiratory Rate: 16 breaths/min, Blood Pressure: 125/72 mmHg. Eyes Nonicteric. Reactive to light. Ears, Nose, Mouth, and Throat Lips, teeth, and gums WNL.Marland Kitchen Moist mucosa without lesions. Neck supple and nontender. No palpable supraclavicular or cervical adenopathy. Normal sized without goiter. Respiratory WNL. No retractions.. Breath sounds WNL, No rubs, rales, rhonchi, or wheeze.. Chest Breasts symmetical and no nipple discharge.. Breast tissue WNL, no masses, lumps, or tenderness.. Lymphatic No adneopathy. No adenopathy. No adenopathy. Musculoskeletal Adexa without tenderness or enlargement.. Digits and nails w/o clubbing, cyanosis, infection, petechiae, ischemia, or inflammatory conditions.Marland Kitchen Psychiatric Judgement and insight Intact.. No evidence of depression, anxiety, or agitation.. General Notes: none need sharp debridement today and the  wound has clean healthy granulation tissue with minimal lymphedema persisting Integumentary (Hair, Skin) No suspicious lesions. No crepitus or fluctuance. No peri-wound warmth or erythema. No masses.. Wound #1 status is Open. Original cause of wound was Trauma. The wound is located on the Right,Lateral Lower Leg. The wound measures 5cm length x 1.8cm width x 0.1cm depth; 7.069cm^2 area and 0.707cm^3 volume. There is Fat Layer (Subcutaneous Tissue) Exposed exposed. There is no tunneling or undermining noted. There is a medium amount of serosanguineous drainage noted. The wound margin is flat and intact. There is medium (34-66%) pink, pale granulation within the wound bed. There is a small (1-33%) amount of necrotic tissue within the wound bed including Adherent Slough. The periwound skin appearance exhibited: Scarring, Hemosiderin Staining, Mottled. The periwound skin appearance did  not MACOY, SIXTOS ([id number]) exhibit: Callus, Crepitus, Excoriation, Induration, Rash, Dry/Scaly, Maceration, Atrophie Blanche, Cyanosis, Ecchymosis, Pallor, Rubor, Erythema. Periwound temperature was noted as No Abnormality. The periwound has tenderness on palpation. Assessment Active Problems ICD-10 E11.622 - Type 2 diabetes mellitus with other skin ulcer L97.212 - Non-pressure chronic ulcer of right calf with fat layer exposed I87.2 - Venous insufficiency (chronic) (peripheral) F17.218 - Nicotine dependence, cigarettes, with other nicotine-induced disorders Plan Wound Cleansing: Wound #1 Right,Lateral Lower Leg: Cleanse wound with mild soap and water - in clinic Anesthetic: Wound #1 Right,Lateral Lower Leg: Topical Lidocaine 4% cream applied to wound bed prior to debridement Primary Wound Dressing: Wound #1 Right,Lateral Lower Leg: Mepitel One Contact layer Hydrafera Blue Secondary Dressing: Wound #1 Right,Lateral Lower Leg: ABD pad Dressing Change Frequency: Wound #1 Right,Lateral Lower  Leg: Change dressing every week Follow-up Appointments: Wound #1 Right,Lateral Lower Leg: Return Appointment in 1 week. Edema Control: Wound #1 Right,Lateral Lower Leg: Kerlix and Coban - Right Lower Extremity Elevate legs to the level of the heart and pump ankles as often as possible Additional Orders / Instructions: Wound #1 Right,Lateral Lower Leg: Stop Smoking Moritz, Yifan L. ([id number]) Increase protein intake. Activity as tolerated Other: - ZINC, VIT A, VIT C, MVI INCLUDE THESE IN YOU DIET His ABI was noncompressible and it is imperative that we get his vascular notes from the [facility]. If not we will have to order arterial and venous duplex studies of both lower extremities. After review I have recommended: 1. apply a nonadherent layer before placing Hydrofera Blue with a 2 layer compression with Kerlix and Coban 2. Good control of his diabetes mellitus and I have asked him to see his PCP for further care 3. in discussed with them the need to completely give up smoking and I discussed the risk benefits and alternatives especially all the possibilities of improved wound healing 4. elevation and exercise 5. Adequate protein, vitamin A, vitamin C and zinc 6. Regular visits to the wound center he and his wife have read all questions answered and will be compliant Electronic Signature(s) Signed: 08/29/2016 11:53:04 AM By: Christin Fudge MD, FACS Entered By: Christin Fudge on 08/29/2016 11:53:03 Brad Taylor ([id number]) -------------------------------------------------------------------------------- SuperBill Details Patient Name: Brad Taylor. Date of Service: 08/29/2016 Medical Record Number: [id number] Patient Account Number: [id number] Date of Birth/Sex: November 23, 1950 (66 y.o. Male) Treating RN: Afful, RN, BSN, [place] Primary Care Provider: Jenna Luo Other Clinician: Referring Provider: Jenna Luo Treating Provider/Extender: Frann Rider in  Treatment: 2 Diagnosis Coding ICD-10 Codes Code Description E11.622 Type 2 diabetes mellitus with other skin ulcer L97.212 Non-pressure chronic ulcer of right calf with fat layer exposed I87.2 Venous insufficiency (chronic) (peripheral) F17.218 Nicotine dependence, cigarettes, with other nicotine-induced disorders Facility Procedures CPT4 Code: [id number] Description: 99213 - WOUND CARE VISIT-LEV 3 EST PT Modifier: Quantity: 1 Physician Procedures CPT4 Code Description: [id number] - WC PHYS LEVEL 3 - EST PT ICD-10 Description Diagnosis E11.622 Type 2 diabetes mellitus with other skin ulcer L97.212 Non-pressure chronic ulcer of right calf with fat l I87.2 Venous insufficiency (chronic)  (peripheral) F17.218 Nicotine dependence, cigarettes, with other nicotin Modifier: ayer exposed e-induced di Quantity: 1 sorders Electronic Signature(s) Signed: 08/29/2016 11:53:15 AM By: Christin Fudge MD, FACS Entered By: Christin Fudge on 08/29/2016 11:53:15

## 2016-08-30 NOTE — Progress Notes (Signed)
Brad Taylor ([id number]) Visit Report for 08/29/2016 Arrival Information Details Patient Name: Brad Taylor, Brad Taylor. Date of Service: 08/29/2016 11:15 AM Medical Record Number: [id number] Patient Account Number: [id number] Date of Birth/Sex: Apr 16, 1951 (66 y.o. Male) Treating RN: Brad Taylor Primary Care Brad Taylor: Brad Taylor Other Clinician: Referring Brad Taylor: Brad Taylor Treating Brad Taylor/Extender: Brad Taylor in Treatment: 2 Visit Information History Since Last Visit Added or deleted any medications: No Patient Arrived: Ambulatory Any new allergies or adverse reactions: No Arrival Time: 10:58 Had a fall or experienced change in No Accompanied By: wife activities of daily living that may affect Transfer Assistance: None risk of falls: Patient Identification Verified: Yes Signs or symptoms of abuse/neglect since last No Secondary Verification Yes visito Process Completed: Hospitalized since last visit: No Patient Requires No Has Dressing in Place as Prescribed: Yes Transmission-Based Pain Present Now: Yes Precautions: Patient Has Alerts: Yes Patient Alerts: Hgb A1c 8.4 ABI:Non- Compressible >220 Electronic Signature(s) Signed: 08/29/2016 3:30:03 PM By: Brad Cool, RN, BSN, Kim RN, BSN Entered By: Brad Cool, RN, BSN, Brad Taylor on 08/29/2016 11:57:01 Brad Taylor ([id number]) -------------------------------------------------------------------------------- Clinic Level of Care Assessment Details Patient Name: Brad Taylor, Brad Taylor. Date of Service: 08/29/2016 11:15 AM Medical Record Number: [id number] Patient Account Number: [id number] Date of Birth/Sex: [birth year] (66 y.o. Male) Treating RN: Brad Taylor Primary Care Brad Taylor: Brad Taylor Other Clinician: Referring Brad Taylor: Brad Taylor Treating Brad Taylor/Extender: Brad Taylor in Treatment: 2 Clinic Level of Care Assessment Items TOOL 4 Quantity Score []  - Use when only an EandM is performed on FOLLOW-UP  visit 0 ASSESSMENTS - Nursing Assessment / Reassessment []  - Reassessment of Co-morbidities (includes updates in patient status) 0 X - Reassessment of Adherence to Treatment Plan 1 5 ASSESSMENTS - Wound and Skin Assessment / Reassessment X - Simple Wound Assessment / Reassessment - one wound 1 5 []  - Complex Wound Assessment / Reassessment - multiple wounds 0 []  - Dermatologic / Skin Assessment (not related to wound area) 0 ASSESSMENTS - Focused Assessment []  - Circumferential Edema Measurements - multi extremities 0 []  - Nutritional Assessment / Counseling / Intervention 0 []  - Lower Extremity Assessment (monofilament, tuning fork, pulses) 0 []  - Peripheral Arterial Disease Assessment (using hand held doppler) 0 ASSESSMENTS - Ostomy and/or Continence Assessment and Care []  - Incontinence Assessment and Management 0 []  - Ostomy Care Assessment and Management (repouching, etc.) 0 PROCESS - Coordination of Care X - Simple Patient / Family Education for ongoing care 1 15 []  - Complex (extensive) Patient / Family Education for ongoing care 0 X - Staff obtains Programmer, systems, Records, Test Results / Process Orders 1 10 []  - Staff telephones HHA, Nursing Homes / Clarify orders / etc 0 []  - Routine Transfer to another Facility (non-emergent condition) 0 Brad Taylor ([id number]) []  - Routine Hospital Admission (non-emergent condition) 0 []  - New Admissions / Biomedical engineer / Ordering NPWT, Apligraf, etc. 0 []  - Emergency Hospital Admission (emergent condition) 0 X - Simple Discharge Coordination 1 10 []  - Complex (extensive) Discharge Coordination 0 PROCESS - Special Needs []  - Pediatric / Minor Patient Management 0 []  - Isolation Patient Management 0 []  - Hearing / Language / Visual special needs 0 []  - Assessment of Community assistance (transportation, D/C planning, etc.) 0 []  - Additional assistance / Altered mentation 0 []  - Support Surface(s) Assessment (bed, cushion, seat,  etc.) 0 INTERVENTIONS - Wound Cleansing / Measurement X - Simple Wound Cleansing - one wound 1 5 []  - Complex Wound Cleansing - multiple wounds 0 X -  Wound Imaging (photographs - any number of wounds) 1 5 []  - Wound Tracing (instead of photographs) 0 X - Simple Wound Measurement - one wound 1 5 []  - Complex Wound Measurement - multiple wounds 0 INTERVENTIONS - Wound Dressings []  - Small Wound Dressing one or multiple wounds 0 X - Medium Wound Dressing one or multiple wounds 1 15 []  - Large Wound Dressing one or multiple wounds 0 []  - Application of Medications - topical 0 []  - Application of Medications - injection 0 INTERVENTIONS - Miscellaneous []  - External ear exam 0 Brad Taylor, Brad L. ([id number]) []  - Specimen Collection (cultures, biopsies, blood, body fluids, etc.) 0 []  - Specimen(s) / Culture(s) sent or taken to Lab for analysis 0 []  - Patient Transfer (multiple staff / Brad Taylor Lift / Similar devices) 0 []  - Simple Staple / Suture removal (25 or less) 0 []  - Complex Staple / Suture removal (26 or more) 0 []  - Hypo / Hyperglycemic Management (close monitor of Blood Glucose) 0 X - Ankle / Brachial Index (ABI) - do not check if billed separately 1 15 X - Vital Signs 1 5 Has the patient been seen at the hospital within the last three years: Yes Total Score: 95 Level Of Care: New/Established - Level 3 Electronic Signature(s) Signed: 08/29/2016 3:30:03 PM By: Brad Cool, RN, BSN, Kim RN, BSN Entered By: Brad Cool, RN, BSN, Brad Taylor on 08/29/2016 11:44:58 Brad Taylor, Brad Taylor ([id number]) -------------------------------------------------------------------------------- Encounter Discharge Information Details Patient Name: Brad Taylor, Brad L. Date of Service: 08/29/2016 11:15 AM Medical Record Number: [id number] Patient Account Number: [id number] Date of Birth/Sex: 1950-08-26 (66 y.o. Male) Treating RN: Baruch Gouty, RN, BSN, Brad Taylor Primary Care Brad Taylor: Brad Taylor Other Clinician: Referring Brad Taylor:  Brad Taylor Treating Brad Taylor/Extender: Brad Taylor in Treatment: 2 Encounter Discharge Information Items Discharge Pain Level: 0 Discharge Condition: Stable Ambulatory Status: Ambulatory Discharge Destination: Home Transportation: Private Auto Accompanied By: wife Schedule Follow-up Appointment: Yes Medication Reconciliation completed and provided to Patient/Care Yes Kaelyn Innocent: Provided on Clinical Summary of Care: 08/29/2016 Taylor Type Recipient Paper Patient [employer] Electronic Signature(s) Signed: 08/29/2016 3:30:03 PM By: Brad Cool RN, BSN, Kim RN, BSN Previous Signature: 08/29/2016 11:44:31 AM Version By: Ruthine Dose Entered By: Brad Cool RN, BSN, Brad Taylor on 08/29/2016 11:46:32 [place], Brad Taylor ([id number]) -------------------------------------------------------------------------------- Lower Extremity Assessment Details Patient Name: Brad Taylor, Brad Taylor. Date of Service: 08/29/2016 11:15 AM Medical Record Number: [id number] Patient Account Number: [id number] Date of Birth/Sex: 1951-04-24 (66 y.o. Male) Treating RN: Brad Taylor Primary Care Zyair Rhein: Brad Taylor Other Clinician: Referring Tequita Marrs: Brad Taylor Treating Fiana Gladu/Extender: Brad Taylor in Treatment: 2 Edema Assessment Assessed: [Left: No] [Right: No] E[Left: dema] [Right: :] Calf Left: Right: Point of Measurement: 39 cm From Medial Instep cm 42.6 cm Ankle Left: Right: Point of Measurement: 9 cm From Medial Instep cm 26 cm Vascular Assessment Claudication: Claudication Assessment [Right:None] Pulses: Dorsalis Pedis Palpable: [Right:Yes] Posterior Tibial Extremity colors, hair growth, and conditions: Extremity Color: [Right:Hyperpigmented] Hair Growth on Extremity: [Right:Yes] Temperature of Extremity: [Right:Warm] Capillary Refill: [Right:< 3 seconds] Dependent Rubor: [Right:No] Blanched when Elevated: [Right:No] Lipodermatosclerosis: [Right:No] Toe Nail Assessment Left: Right: Thick:  Yes Discolored: Yes Deformed: Yes Improper Length and Hygiene: Yes Notes RAIFE, LORENC ([id number]) ABI:Non-Compressible >220 Electronic Signature(s) Signed: 08/29/2016 3:30:03 PM By: Brad Cool, RN, BSN, Kim RN, BSN Entered By: Brad Cool, RN, BSN, Brad Taylor on 08/29/2016 11:57:15 Brad Taylor ([id number]) -------------------------------------------------------------------------------- Multi Wound Chart Details Patient Name: Brad Taylor. Date of Service: 08/29/2016 11:15 AM Medical Record Number: [id number] Patient Account Number: [id number] Date of Birth/Sex:  07-04-1951 (66 y.o. Male) Treating RN: Brad Taylor Primary Care Aprille Sawhney: Brad Taylor Other Clinician: Referring Llesenia Fogal: Brad Taylor Treating Natashia Roseman/Extender: Brad Taylor in Treatment: 2 Vital Signs Height(in): 72 Pulse(bpm): 62 Weight(lbs): 250 Blood Pressure 125/72 (mmHg): Body Mass Index(BMI): 34 Temperature(F): 97.5 Respiratory Rate 16 (breaths/min): Photos: [N/A:N/A] Wound Location: Right Lower Leg - Lateral N/A N/A Wounding Event: Trauma N/A N/A Primary Etiology: Diabetic Wound/Ulcer of N/A N/A the Lower Extremity Comorbid History: Chronic Obstructive N/A N/A Pulmonary Disease (COPD), Arrhythmia, Hypertension, Myocardial Infarction, Type II Diabetes Date Acquired: 05/14/2016 N/A N/A Weeks of Treatment: 2 N/A N/A Wound Status: Open N/A N/A Measurements L x W x D 5x1.8x0.1 N/A N/A (cm) Area (cm) : 7.069 N/A N/A Volume (cm) : 0.707 N/A N/A % Reduction in Area: 51.60% N/A N/A % Reduction in Volume: 51.60% N/A N/A Classification: Grade 1 N/A N/A Exudate Amount: Medium N/A N/A Exudate Type: Serosanguineous N/A N/A Exudate Color: red, brown N/A N/A Wound Margin: Flat and Intact N/A N/A AHMARE, MORALAS. ([id number]) Granulation Amount: Medium (34-66%) N/A N/A Granulation Quality: Pink, Pale N/A N/A Necrotic Amount: Small (1-33%) N/A N/A Exposed Structures: Fat Layer (Subcutaneous  N/A N/A Tissue) Exposed: Yes Fascia: No Tendon: No Muscle: No Joint: No Bone: No Epithelialization: Small (1-33%) N/A N/A Periwound Skin Texture: Scarring: Yes N/A N/A Excoriation: No Induration: No Callus: No Crepitus: No Rash: No Periwound Skin Maceration: No N/A N/A Moisture: Dry/Scaly: No Periwound Skin Color: Hemosiderin Staining: Yes N/A N/A Mottled: Yes Atrophie Blanche: No Cyanosis: No Ecchymosis: No Erythema: No Pallor: No Rubor: No Temperature: No Abnormality N/A N/A Tenderness on Yes N/A N/A Palpation: Wound Preparation: Ulcer Cleansing: N/A N/A Rinsed/Irrigated with Saline Topical Anesthetic Applied: Other: lidocaine 4% Treatment Notes Wound #1 (Right, Lateral Lower Leg) 1. Cleansed with: Cleanse wound with antibacterial soap and water 2. Anesthetic Topical Lidocaine 4% cream to wound bed prior to debridement 4. Dressing Applied: Hydrafera Blue 5. Secondary Dressing Applied ABD Pad Notes KRISTIE, GONZALO. ([id number]) Mepitel one over wound base dt hydrafera sticking. [place] from toes to knee Electronic Signature(s) Signed: 08/29/2016 11:50:28 AM By: Christin Fudge MD, FACS Entered By: Christin Fudge on 08/29/2016 11:50:28 Brad Taylor ([id number]) -------------------------------------------------------------------------------- [place] Details Patient Name: Brad Taylor, Brad Taylor. Date of Service: 08/29/2016 11:15 AM Medical Record Number: [id number] Patient Account Number: [id number] Date of Birth/Sex: 05-03-1951 (66 y.o. Male) Treating RN: Brad Taylor Primary Care Kurk Corniel: Brad Taylor Other Clinician: Referring Shavona Gunderman: Brad Taylor Treating Mercedies Ganesh/Extender: Brad Taylor in Treatment: 2 Active Inactive ` Orientation to the Wound Care Program Nursing Diagnoses: Knowledge deficit related to the wound healing center program Goals: Patient/caregiver will verbalize understanding of the [place] Program Date Initiated: 08/15/2016 Target Resolution Date: 09/05/2016 Goal Status: Active Interventions: Provide education on orientation to the wound center Notes: ` Venous Leg Ulcer Nursing Diagnoses: Knowledge deficit related to disease process and management Potential for venous Insuffiency (use before diagnosis confirmed) Goals: Patient will maintain optimal edema control Date Initiated: 08/15/2016 Target Resolution Date: 09/05/2016 Goal Status: Active Patient/caregiver will verbalize understanding of disease process and disease management Date Initiated: 08/15/2016 Target Resolution Date: 09/05/2016 Goal Status: Active Verify adequate tissue perfusion prior to therapeutic compression application Date Initiated: 08/15/2016 Target Resolution Date: 09/05/2016 Goal Status: Active Interventions: Assess peripheral edema status every visit. Compression as ordered Brad Taylor, Brad Taylor ([id number]) Provide education on venous insufficiency Treatment Activities: Therapeutic compression applied : 08/15/2016 Notes: ` Wound/Skin Impairment Nursing Diagnoses: Impaired tissue integrity Knowledge deficit related to  ulceration/compromised skin integrity Goals: Patient/caregiver will verbalize understanding of skin care regimen Date Initiated: 08/15/2016 Target Resolution Date: 09/05/2016 Goal Status: Active Ulcer/skin breakdown will have a volume reduction of 30% by week 4 Date Initiated: 08/15/2016 Target Resolution Date: 09/05/2016 Goal Status: Active Ulcer/skin breakdown will have a volume reduction of 50% by week 8 Date Initiated: 08/15/2016 Target Resolution Date: 09/05/2016 Goal Status: Active Ulcer/skin breakdown will have a volume reduction of 80% by week 12 Date Initiated: 08/15/2016 Target Resolution Date: 09/05/2016 Goal Status: Active Ulcer/skin breakdown will heal within 14 weeks Date Initiated: 08/15/2016 Target Resolution Date: 09/05/2016 Goal Status:  Active Interventions: Assess patient/caregiver ability to obtain necessary supplies Assess patient/caregiver ability to perform ulcer/skin care regimen upon admission and as needed Assess ulceration(s) every visit Provide education on smoking Provide education on ulcer and skin care Treatment Activities: Referred to DME Kaya Pottenger for dressing supplies : 08/15/2016 Skin care regimen initiated : 08/15/2016 Topical wound management initiated : 08/15/2016 Notes: Brad Taylor, Brad Taylor ([id number]) Electronic Signature(s) Signed: 08/29/2016 3:30:03 PM By: Brad Cool, RN, BSN, Kim RN, BSN Entered By: Brad Cool, RN, BSN, Brad Taylor on 08/29/2016 11:13:04 Brad Taylor ([id number]) -------------------------------------------------------------------------------- Pain Assessment Details Patient Name: Brad Taylor, Brad Taylor. Date of Service: 08/29/2016 11:15 AM Medical Record Number: [id number] Patient Account Number: [id number] Date of Birth/Sex: [birth year] (66 y.o. Male) Treating RN: Brad Taylor Primary Care Neko Mcgeehan: Brad Taylor Other Clinician: Referring Daneli Butkiewicz: Brad Taylor Treating Keola Heninger/Extender: Brad Taylor in Treatment: 2 Active Problems Location of Pain Severity and Description of Pain Patient Has Paino Yes Site Locations Pain Location: Generalized Pain With Dressing Change: No Pain Management and Medication Current Pain Management: Goals for Pain Management Topical or injectable lidocaine is offered to patient for acute pain when surgical debridement is performed. If needed, Patient is instructed to use over the counter pain medication for the following 24-48 hours after debridement. Wound care MDs do not prescribed pain medications. Patient has chronic pain or uncontrolled pain. Patient has been instructed to make an appointment with their Primary Care Physician for pain management. Electronic Signature(s) Signed: 08/29/2016 3:30:03 PM By: Brad Cool, RN, BSN, Kim RN, BSN Entered By:  Brad Cool, RN, BSN, Brad Taylor on 08/29/2016 11:02:11 Brad Taylor ([id number]) -------------------------------------------------------------------------------- Patient/Caregiver Education Details Patient Name: Brad Taylor, Brad Taylor. Date of Service: 08/29/2016 11:15 AM Medical Record Number: [id number] Patient Account Number: [id number] Date of Birth/Gender: 10-21-1950 (66 y.o. Male) Treating RN: Brad Taylor Primary Care Physician: Brad Taylor Other Clinician: Referring Physician: Jenna Taylor Treating Physician/Extender: Brad Taylor in Treatment: 2 Education Assessment Education Provided To: Patient Education Topics Provided Smoking and Wound Healing: Handouts: Smoking and Wound Healing Methods: Explain/Verbal Responses: State content correctly Venous: Handouts: Managing Venous Disease and Related Ulcers Methods: Demonstration, Explain/Verbal Responses: State content correctly Wound/Skin Impairment: Handouts: Caring for Your Ulcer, Other: wound care as prescribed Methods: Demonstration Responses: State content correctly Electronic Signature(s) Signed: 08/29/2016 3:30:03 PM By: Brad Cool, RN, BSN, Kim RN, BSN Entered By: Brad Cool, RN, BSN, Brad Taylor on 08/29/2016 11:47:16 Brad Taylor, Brad Taylor ([id number]) -------------------------------------------------------------------------------- Wound Assessment Details Patient Name: Brad Taylor, SUPPES L. Date of Service: 08/29/2016 11:15 AM Medical Record Number: [id number] Patient Account Number: [id number] Date of Birth/Sex: November 25, 1950 (66 y.o. Male) Treating RN: Brad Taylor Primary Care Marleni Gallardo: Brad Taylor Other Clinician: Referring Lennan Malone: Brad Taylor Treating Klara Stjames/Extender: Brad Taylor in Treatment: 2 Wound Status Wound Number: 1 Primary Diabetic Wound/Ulcer of the Lower Etiology: Extremity Wound Location: Right Lower Leg - Lateral Wound Open Wounding Event: Trauma Status: Date Acquired: 05/14/2016 Comorbid Chronic  Obstructive Pulmonary  Disease Weeks Of Treatment: 2 History: (COPD), Arrhythmia, Hypertension, Clustered Wound: No Myocardial Infarction, Type II Diabetes Photos Wound Measurements Length: (cm) 5 Width: (cm) 1.8 Depth: (cm) 0.1 Area: (cm) 7.069 Volume: (cm) 0.707 % Reduction in Area: 51.6% % Reduction in Volume: 51.6% Epithelialization: Small (1-33%) Tunneling: No Undermining: No Wound Description Classification: Grade 1 Wound Margin: Flat and Intact Exudate Amount: Medium Exudate Type: Serosanguineous Exudate Color: red, brown Foul Odor After Cleansing: No Slough/Fibrino Yes Wound Bed Granulation Amount: Medium (34-66%) Exposed Structure Granulation Quality: Pink, Pale Fascia Exposed: No Necrotic Amount: Small (1-33%) Fat Layer (Subcutaneous Tissue) Exposed: Yes Necrotic Quality: Adherent Slough Tendon Exposed: No Muscle Exposed: No Joint Exposed: No Bone Exposed: No Shepheard, Gurfateh L. ([id number]) Periwound Skin Texture Texture Color No Abnormalities Noted: No No Abnormalities Noted: No Callus: No Atrophie Blanche: No Crepitus: No Cyanosis: No Excoriation: No Ecchymosis: No Induration: No Erythema: No Rash: No Hemosiderin Staining: Yes Scarring: Yes Mottled: Yes Pallor: No Moisture Rubor: No No Abnormalities Noted: No Dry / Scaly: No Temperature / Pain Maceration: No Temperature: No Abnormality Tenderness on Palpation: Yes Wound Preparation Ulcer Cleansing: Rinsed/Irrigated with Saline Topical Anesthetic Applied: Other: lidocaine 4%, Treatment Notes Wound #1 (Right, Lateral Lower Leg) 1. Cleansed with: Cleanse wound with antibacterial soap and water 2. Anesthetic Topical Lidocaine 4% cream to wound bed prior to debridement 4. Dressing Applied: Hydrafera Blue 5. Secondary Dressing Applied ABD Pad Notes Mepitel one over wound base dt hydrafera sticking. [place] from toes to knee Electronic Signature(s) Signed: 08/29/2016 3:30:03  PM By: Brad Cool, RN, BSN, Kim RN, BSN Entered By: Brad Cool, RN, BSN, Brad Taylor on 08/29/2016 11:11:07 Brad Taylor ([id number]) -------------------------------------------------------------------------------- [place] Details Patient Name: MCKADEN, DELON. Date of Service: 08/29/2016 11:15 AM Medical Record Number: [id number] Patient Account Number: [id number] Date of Birth/Sex: 1950/08/26 (66 y.o. Male) Treating RN: Brad Taylor Primary Care Nabor Thomann: Brad Taylor Other Clinician: Referring Soumya Colson: Brad Taylor Treating Clarabel Marion/Extender: Brad Taylor in Treatment: 2 Vital Signs Time Taken: 11:02 Temperature (F): 97.5 Height (in): 72 Pulse (bpm): 62 Source: Measured Respiratory Rate (breaths/min): 16 Weight (lbs): 250 Blood Pressure (mmHg): 125/72 Source: Measured Reference Range: 80 - 120 mg / dl Body Mass Index (BMI): 33.9 Electronic Signature(s) Signed: 08/29/2016 3:30:03 PM By: Brad Cool, RN, BSN, Kim RN, BSN Entered By: Brad Cool, RN, BSN, Brad Taylor on 08/29/2016 11:02:51

## 2016-09-05 ENCOUNTER — Encounter: Payer: Commercial Managed Care - HMO | Attending: Surgery | Admitting: Surgery

## 2016-09-05 DIAGNOSIS — J449 Chronic obstructive pulmonary disease, unspecified: Secondary | ICD-10-CM | POA: Diagnosis not present

## 2016-09-05 DIAGNOSIS — F17218 Nicotine dependence, cigarettes, with other nicotine-induced disorders: Secondary | ICD-10-CM | POA: Insufficient documentation

## 2016-09-05 DIAGNOSIS — I252 Old myocardial infarction: Secondary | ICD-10-CM | POA: Diagnosis not present

## 2016-09-05 DIAGNOSIS — I872 Venous insufficiency (chronic) (peripheral): Secondary | ICD-10-CM | POA: Insufficient documentation

## 2016-09-05 DIAGNOSIS — I10 Essential (primary) hypertension: Secondary | ICD-10-CM | POA: Diagnosis not present

## 2016-09-05 DIAGNOSIS — E11622 Type 2 diabetes mellitus with other skin ulcer: Secondary | ICD-10-CM | POA: Insufficient documentation

## 2016-09-05 DIAGNOSIS — E039 Hypothyroidism, unspecified: Secondary | ICD-10-CM | POA: Insufficient documentation

## 2016-09-05 DIAGNOSIS — Z79899 Other long term (current) drug therapy: Secondary | ICD-10-CM | POA: Insufficient documentation

## 2016-09-05 DIAGNOSIS — Z8673 Personal history of transient ischemic attack (TIA), and cerebral infarction without residual deficits: Secondary | ICD-10-CM | POA: Insufficient documentation

## 2016-09-05 DIAGNOSIS — Z794 Long term (current) use of insulin: Secondary | ICD-10-CM | POA: Insufficient documentation

## 2016-09-05 DIAGNOSIS — I890 Lymphedema, not elsewhere classified: Secondary | ICD-10-CM | POA: Diagnosis not present

## 2016-09-05 DIAGNOSIS — L97212 Non-pressure chronic ulcer of right calf with fat layer exposed: Secondary | ICD-10-CM | POA: Insufficient documentation

## 2016-09-06 NOTE — Progress Notes (Addendum)
HOLTON, HEMANI ([id number]) Visit Report for 09/05/2016 Chief Complaint Document Details Patient Name: Brad Taylor, Brad Taylor. Date of Service: 09/05/2016 11:15 AM Medical Record Number: [id number] Patient Account Number: [id number] Date of Birth/Sex: 08/24/1950 (66 y.o. Male) Treating RN: Baruch Gouty, RN, BSN, Velva Harman Primary Care Provider: Jenna Luo Other Clinician: Referring Provider: Jenna Luo Treating Provider/Extender: Frann Rider in Treatment: 3 Information Obtained from: Patient Chief Complaint Patient presents for treatment of an open ulcer due to venous insufficiency along with diabetes mellitus, to his right lower extremity which she's had for about 3 months Electronic Signature(s) Signed: 09/05/2016 12:17:16 PM By: Christin Fudge MD, FACS Entered By: Christin Fudge on 09/05/2016 12:17:16 Brad Taylor ([id number]) -------------------------------------------------------------------------------- HPI Details Patient Name: Brad Taylor. Date of Service: 09/05/2016 11:15 AM Medical Record Number: [id number] Patient Account Number: [id number] Date of Birth/Sex: 08-25-1950 (66 y.o. Male) Treating RN: Baruch Gouty, RN, BSN, [place] Primary Care Provider: Jenna Luo Other Clinician: Referring Provider: Jenna Luo Treating Provider/Extender: Frann Rider in Treatment: 3 History of Present Illness Location: right lower extremity Quality: Patient reports experiencing a dull pain to affected area(s). Severity: Patient states wound are getting worse. Duration: Patient has had the wound for > 3 months prior to seeking treatment at the wound center Timing: Pain in wound is Intermittent (comes and goes Context: The wound occurred when the patient had a lacerated wound which was treated with sutures which gaped and had an open ulceration Modifying Factors: Other treatment(s) tried include:been having on and Unna's boots applied by his PCP in the office Associated Signs and  Symptoms: Patient reports having increase swelling. HPI Description: 66 year old gentleman with a past medical history of stroke, diabetes mellitus2 with hyperglycemia, CVA, hemiplegia, a phase ER, cellulitis of the left gluteal area and is a smoker and smokes a packet of cigarettes a day. his past medical history also significant for COPD, thyroid disease and is status post cholecystectomy and status post knee surgery in October 2017 the patient had a laceration to his right lateral shin and had a deep tear which was sutured with 4-0 Ethilon sutures but these opened out a cause of his chronic venous insufficiency and severe edema and the patient was seen by his PCP and put on Bactrim. she was also advised to do dressings with Silvadene and Unna's boots. We don't have notes from the [facility] in [place] but he says he's had a thorough workup done both arterial and venous and was told that he has venous insufficiency and was to wear compression stockings of 30-40 mm variety. We'll try and obtain these notes. He also gives a history of a lung mass which was a cancer and treated with resection but no chemotherapy or radiation. 08/29/2016 -- not yet received any of his notes from the [facility] 09/05/2016 -- in spite of every effort on the patient's part and are part we have not been able to get any reports regarding his arterial and venous workup. At this stage I'm going to request fresh arterial duplex studies and venous reflux studies. Electronic Signature(s) Signed: 09/05/2016 12:17:56 PM By: Christin Fudge MD, FACS Entered By: Christin Fudge on 09/05/2016 12:17:55 Brad Taylor ([id number]) -------------------------------------------------------------------------------- Physical Exam Details Patient Name: Brad Taylor, Brad Taylor. Date of Service: 09/05/2016 11:15 AM Medical Record Number: [id number] Patient Account Number: [id number] Date of Birth/Sex: 1950-11-24 (66 y.o. Male) Treating RN: Baruch Gouty,  RN, BSN, [employer] Primary Care Provider: Jenna Luo Other Clinician: Referring Provider: Jenna Luo Treating Provider/Extender: Frann Rider in Treatment:  3 Constitutional . Pulse regular. Respirations normal and unlabored. Afebrile. . Eyes Nonicteric. Reactive to light. Ears, Nose, Mouth, and Throat Lips, teeth, and gums WNL.Marland Kitchen Moist mucosa without lesions. Neck supple and nontender. No palpable supraclavicular or cervical adenopathy. Normal sized without goiter. Respiratory WNL. No retractions.. Breath sounds WNL, No rubs, rales, rhonchi, or wheeze.. Cardiovascular Heart rhythm and rate regular, no murmur or gallop.. Pedal Pulses WNL. No clubbing, cyanosis or edema. Chest Breasts symmetical and no nipple discharge.. Breast tissue WNL, no masses, lumps, or tenderness.. Lymphatic No adneopathy. No adenopathy. No adenopathy. Musculoskeletal Adexa without tenderness or enlargement.. Digits and nails w/o clubbing, cyanosis, infection, petechiae, ischemia, or inflammatory conditions.. Integumentary (Hair, Skin) No suspicious lesions. No crepitus or fluctuance. No peri-wound warmth or erythema. No masses.Marland Kitchen Psychiatric Judgement and insight Intact.. No evidence of depression, anxiety, or agitation.. Notes the lymphedema has increased significantly and the patient's wound itself looks clean with no evidence of any sharp debridement required today Electronic Signature(s) Signed: 09/05/2016 12:18:31 PM By: Christin Fudge MD, FACS Entered By: Christin Fudge on 09/05/2016 12:18:30 Brad Taylor ([id number]) -------------------------------------------------------------------------------- Physician Orders Details Patient Name: Brad Taylor, Brad Taylor. Date of Service: 09/05/2016 11:15 AM Medical Record Number: [id number] Patient Account Number: [id number] Date of Birth/Sex: Feb 16, 1951 (66 y.o. Male) Treating RN: Afful, RN, BSN, [place] Primary Care Provider: Jenna Luo Other  Clinician: Referring Provider: Jenna Luo Treating Provider/Extender: Frann Rider in Treatment: 3 Verbal / Phone Orders: No Diagnosis Coding Wound Cleansing Wound #1 Right,Lateral Lower Leg o Cleanse wound with mild soap and water - in clinic Anesthetic Wound #1 Right,Lateral Lower Leg o Topical Lidocaine 4% cream applied to wound bed prior to debridement Primary Wound Dressing Wound #1 Right,Lateral Lower Leg o Hydrafera Blue Secondary Dressing Wound #1 Right,Lateral Lower Leg o Gauze and Kerlix/Conform Dressing Change Frequency Wound #1 Right,Lateral Lower Leg o Change dressing every other day. Follow-up Appointments Wound #1 Right,Lateral Lower Leg o Return Appointment in 1 week. Edema Control Wound #1 Right,Lateral Lower Leg o Kerlix and Coban - Right Lower Extremity o Patient to wear own compression stockings o Elevate legs to the level of the heart and pump ankles as often as possible Additional Orders / Instructions Wound #1 Right,Lateral Lower Leg o Stop Smoking o Increase protein intake. o Activity as tolerated Toft, [place] ([id number]) o Other: - ZINC, VIT A, VIT C, MVI INCLUDE THESE IN YOU DIET Notes Vascular orders removed because patient said he had it done in the [place] and he will bring in the report. He does not want to repeat it. Electronic Signature(s) Signed: 09/12/2016 4:22:10 PM By: Christin Fudge MD, FACS Signed: 12/03/2016 4:24:13 PM By: Regan Lemming BSN, RN Previous Signature: 09/05/2016 3:11:09 PM Version By: Christin Fudge MD, FACS Previous Signature: 09/05/2016 3:37:28 PM Version By: Regan Lemming BSN, RN Entered By: Regan Lemming on 09/12/2016 15:37:07 Brad Taylor ([id number]) -------------------------------------------------------------------------------- Problem List Details Patient Name: Brad Taylor, Brad Taylor. Date of Service: 09/05/2016 11:15 AM Medical Record Number: [id number] Patient Account Number:  [id number] Date of Birth/Sex: 26-Sep-1950 (66 y.o. Male) Treating RN: Baruch Gouty, RN, BSN, [employer] Primary Care Provider: Jenna Luo Other Clinician: Referring Provider: Jenna Luo Treating Provider/Extender: Frann Rider in Treatment: 3 Active Problems ICD-10 Encounter Code Description Active Date Diagnosis E11.622 Type 2 diabetes mellitus with other skin ulcer 08/15/2016 Yes L97.212 Non-pressure chronic ulcer of right calf with fat layer 08/15/2016 Yes exposed I87.2 Venous insufficiency (chronic) (peripheral) 08/15/2016 Yes F17.218 Nicotine dependence, cigarettes, with other nicotine- 08/15/2016 Yes induced disorders  Inactive Problems Resolved Problems Electronic Signature(s) Signed: 09/05/2016 12:17:07 PM By: Christin Fudge MD, FACS Entered By: Christin Fudge on 09/05/2016 12:17:07 Brad Taylor ([id number]) -------------------------------------------------------------------------------- Progress Note Details Patient Name: Brad Taylor, Brad L. Date of Service: 09/05/2016 11:15 AM Medical Record Number: [id number] Patient Account Number: [id number] Date of Birth/Sex: 23-Feb-1951 (65 y.o. Male) Treating RN: Baruch Gouty, RN, BSN, Velva Harman Primary Care Provider: Jenna Luo Other Clinician: Referring Provider: Jenna Luo Treating Provider/Extender: Frann Rider in Treatment: 3 Subjective Chief Complaint Information obtained from Patient Patient presents for treatment of an open ulcer due to venous insufficiency along with diabetes mellitus, to his right lower extremity which she's had for about 3 months History of Present Illness (HPI) The following HPI elements were documented for the patient's wound: Location: right lower extremity Quality: Patient reports experiencing a dull pain to affected area(s). Severity: Patient states wound are getting worse. Duration: Patient has had the wound for > 3 months prior to seeking treatment at the wound center Timing: Pain in wound  is Intermittent (comes and goes Context: The wound occurred when the patient had a lacerated wound which was treated with sutures which gaped and had an open ulceration Modifying Factors: Other treatment(s) tried include:been having on and Unna's boots applied by his PCP in the office Associated Signs and Symptoms: Patient reports having increase swelling. 66 year old gentleman with a past medical history of stroke, diabetes mellitus2 with hyperglycemia, CVA, hemiplegia, a phase ER, cellulitis of the left gluteal area and is a smoker and smokes a packet of cigarettes a day. his past medical history also significant for COPD, thyroid disease and is status post cholecystectomy and status post knee surgery in October 2017 the patient had a laceration to his right lateral shin and had a deep tear which was sutured with 4-0 Ethilon sutures but these opened out a cause of his chronic venous insufficiency and severe edema and the patient was seen by his PCP and put on Bactrim. she was also advised to do dressings with Silvadene and Unna's boots. We don't have notes from the [facility] in [place] but he says he's had a thorough workup done both arterial and venous and was told that he has venous insufficiency and was to wear compression stockings of 30-40 mm variety. We'll try and obtain these notes. He also gives a history of a lung mass which was a cancer and treated with resection but no chemotherapy or radiation. 08/29/2016 -- not yet received any of his notes from the [facility] 09/05/2016 -- in spite of every effort on the patient's part and are part we have not been able to get any reports regarding his arterial and venous workup. At this stage I'm going to request fresh arterial duplex studies and venous reflux studies. Brad Taylor, Brad Taylor ([id number]) Objective Constitutional Pulse regular. Respirations normal and unlabored. Afebrile. Vitals Time Taken: 11:22 AM, Height: 72 in, Weight: 250  lbs, BMI: 33.9, Temperature: 98.1 F, Pulse: 63 bpm, Respiratory Rate: 18 breaths/min, Blood Pressure: 131/71 mmHg. Eyes Nonicteric. Reactive to light. Ears, Nose, Mouth, and Throat Lips, teeth, and gums WNL.Marland Kitchen Moist mucosa without lesions. Neck supple and nontender. No palpable supraclavicular or cervical adenopathy. Normal sized without goiter. Respiratory WNL. No retractions.. Breath sounds WNL, No rubs, rales, rhonchi, or wheeze.. Cardiovascular Heart rhythm and rate regular, no murmur or gallop.. Pedal Pulses WNL. No clubbing, cyanosis or edema. Chest Breasts symmetical and no nipple discharge.. Breast tissue WNL, no masses, lumps, or tenderness.. Lymphatic No adneopathy. No adenopathy.  No adenopathy. Musculoskeletal Adexa without tenderness or enlargement.. Digits and nails w/o clubbing, cyanosis, infection, petechiae, ischemia, or inflammatory conditions.Marland Kitchen Psychiatric Judgement and insight Intact.. No evidence of depression, anxiety, or agitation.. General Notes: the lymphedema has increased significantly and the patient's wound itself looks clean with no evidence of any sharp debridement required today Integumentary (Hair, Skin) No suspicious lesions. No crepitus or fluctuance. No peri-wound warmth or erythema. No masses.. Wound #1 status is Open. Original cause of wound was Trauma. The wound is located on the Right,Lateral Lower Leg. The wound measures 6.5cm length x 2.5cm width x 0.1cm depth; 12.763cm^2 area and 1.276cm^3 volume. There is Fat Layer (Subcutaneous Tissue) Exposed exposed. There is no tunneling or Heyde, Brad L. ([id number]) undermining noted. There is a medium amount of serosanguineous drainage noted. The wound margin is flat and intact. There is medium (34-66%) pink, pale granulation within the wound bed. There is a small (1-33%) amount of necrotic tissue within the wound bed including Adherent Slough. The periwound skin appearance exhibited: Scarring,  Maceration, Hemosiderin Staining, Mottled. The periwound skin appearance did not exhibit: Callus, Crepitus, Excoriation, Induration, Rash, Dry/Scaly, Atrophie Blanche, Cyanosis, Ecchymosis, Pallor, Rubor, Erythema. Periwound temperature was noted as No Abnormality. The periwound has tenderness on palpation. Assessment Active Problems ICD-10 E11.622 - Type 2 diabetes mellitus with other skin ulcer L97.212 - Non-pressure chronic ulcer of right calf with fat layer exposed I87.2 - Venous insufficiency (chronic) (peripheral) F17.218 - Nicotine dependence, cigarettes, with other nicotine-induced disorders Plan Wound Cleansing: Wound #1 Right,Lateral Lower Leg: Cleanse wound with mild soap and water - in clinic Anesthetic: Wound #1 Right,Lateral Lower Leg: Topical Lidocaine 4% cream applied to wound bed prior to debridement Primary Wound Dressing: Wound #1 Right,Lateral Lower Leg: Hydrafera Blue Secondary Dressing: Wound #1 Right,Lateral Lower Leg: Gauze and Kerlix/Conform Dressing Change Frequency: Wound #1 Right,Lateral Lower Leg: Change dressing every other day. Follow-up Appointments: Wound #1 Right,Lateral Lower Leg: Return Appointment in 1 week. Edema Control: Wound #1 Right,Lateral Lower Leg: Kerlix and Coban - Right Lower Extremity Patient to wear own compression stockings Brad Taylor, Brad L. ([id number]) Elevate legs to the level of the heart and pump ankles as often as possible Additional Orders / Instructions: Wound #1 Right,Lateral Lower Leg: Stop Smoking Increase protein intake. Activity as tolerated Other: - ZINC, VIT A, VIT C, MVI INCLUDE THESE IN YOU DIET General Notes: Vascular orders removed because patient said he had it done in the [place] and he will bring in the report. He does not want to repeat it. His ABI was noncompressible and it is imperative that we get his vascular notes from the [facility]. However, after 2 weeks and multiple request we have not been able  to get this and hence I will order arterial and venous duplex studies of both lower extremities. After review I have recommended: 1. apply a nonadherent layer before placing Hydrofera Blue with a 2 layer compression with Kerlix and Coban. 2. the patient also wants to use his own compression stockings which has helped him in the past 3. Good control of his diabetes mellitus and I have asked him to see his PCP for further care 4. in discussed with them the need to completely give up smoking and I discussed the risk benefits and alternatives especially all the possibilities of improved wound healing 5. elevation and exercise 6. Adequate protein, vitamin A, vitamin C and zinc 7. Regular visits to the wound center he and his wife have read all questions answered and will  be compliant Electronic Signature(s) Signed: 09/12/2016 4:24:09 PM By: Christin Fudge MD, FACS Previous Signature: 09/05/2016 12:20:25 PM Version By: Christin Fudge MD, FACS Entered By: Christin Fudge on 09/12/2016 16:24:09 Brad Taylor ([id number]) -------------------------------------------------------------------------------- SuperBill Details Patient Name: Brad Taylor, Brad L. Date of Service: 09/05/2016 Medical Record Number: [id number] Patient Account Number: [id number] Date of Birth/Sex: 1951/01/26 (66 y.o. Male) Treating RN: Baruch Gouty, RN, BSN, [place] Primary Care Provider: Jenna Luo Other Clinician: Referring Provider: Jenna Luo Treating Provider/Extender: Frann Rider in Treatment: 3 Diagnosis Coding ICD-10 Codes Code Description E11.622 Type 2 diabetes mellitus with other skin ulcer L97.212 Non-pressure chronic ulcer of right calf with fat layer exposed I87.2 Venous insufficiency (chronic) (peripheral) F17.218 Nicotine dependence, cigarettes, with other nicotine-induced disorders Facility Procedures CPT4 Code: [id number] Description: [phone] - WOUND CARE VISIT-LEV 2 EST PT Modifier: Quantity:  1 Physician Procedures CPT4 Code: [id number] Description: [id number] - WC PHYS LEVEL 3 - EST PT ICD-10 Description Diagnosis E11.622 Type 2 diabetes mellitus with other skin ulcer L97.212 Non-pressure chronic ulcer of right calf with fat I87.2 Venous insufficiency (chronic) (peripheral) Modifier: layer exposed Quantity: 1 Electronic Signature(s) Signed: 09/05/2016 12:20:42 PM By: Christin Fudge MD, FACS Entered By: Christin Fudge on 09/05/2016 12:20:42

## 2016-09-06 NOTE — Progress Notes (Signed)
SEBASHTIAN, ZWEIG ([id number]) Visit Report for 09/05/2016 Arrival Information Details Patient Name: Brad Taylor, Brad Taylor. Date of Service: 09/05/2016 11:15 AM Medical Record Number: [id number] Patient Account Number: [id number] Date of Birth/Sex: 01-24-1951 (66 y.o. Male) Treating RN: Baruch Gouty, RN, BSN, [facility] Primary Care Deshane Cotroneo: Jenna Luo Other Clinician: Referring Darcie Mellone: Jenna Luo Treating Leaman Abe/Extender: Frann Rider in Treatment: 3 Visit Information History Since Last Visit All ordered tests and consults were completed: No Patient Arrived: Kasandra Knudsen Added or deleted any medications: No Arrival Time: 11:21 Any new allergies or adverse reactions: No Accompanied By: wife Had a fall or experienced change in No Transfer Assistance: None activities of daily living that may affect Patient Identification Verified: Yes risk of falls: Secondary Verification Yes Signs or symptoms of abuse/neglect since last No Process Completed: visito Patient Requires No Hospitalized since last visit: No Transmission-Based Has Dressing in Place as Prescribed: Yes Precautions: Has Compression in Place as Prescribed: Yes Patient Has Alerts: Yes Pain Present Now: No Patient Alerts: Hgb A1c 8.4 ABI:Non- Compressible >220 Electronic Signature(s) Signed: 09/05/2016 3:37:28 PM By: Regan Lemming BSN, RN Entered By: Regan Lemming on 09/05/2016 11:22:19 Brad Taylor ([id number]) -------------------------------------------------------------------------------- Clinic Level of Care Assessment Details Patient Name: Brad Taylor. Date of Service: 09/05/2016 11:15 AM Medical Record Number: [id number] Patient Account Number: [id number] Date of Birth/Sex: 04-Dec-1950 (66 y.o. Male) Treating RN: Baruch Gouty, RN, BSN, [place] Primary Care Kadesha Virrueta: Jenna Luo Other Clinician: Referring Nargis Abrams: Jenna Luo Treating Izabell Schalk/Extender: Frann Rider in Treatment: 3 Clinic Level of Care  Assessment Items TOOL 4 Quantity Score []  - Use when only an EandM is performed on FOLLOW-UP visit 0 ASSESSMENTS - Nursing Assessment / Reassessment X - Reassessment of Co-morbidities (includes updates in patient status) 1 10 X - Reassessment of Adherence to Treatment Plan 1 5 ASSESSMENTS - Wound and Skin Assessment / Reassessment X - Simple Wound Assessment / Reassessment - one wound 1 5 []  - Complex Wound Assessment / Reassessment - multiple wounds 0 []  - Dermatologic / Skin Assessment (not related to wound area) 0 ASSESSMENTS - Focused Assessment []  - Circumferential Edema Measurements - multi extremities 0 []  - Nutritional Assessment / Counseling / Intervention 0 X - Lower Extremity Assessment (monofilament, tuning fork, pulses) 1 5 []  - Peripheral Arterial Disease Assessment (using hand held doppler) 0 ASSESSMENTS - Ostomy and/or Continence Assessment and Care []  - Incontinence Assessment and Management 0 []  - Ostomy Care Assessment and Management (repouching, etc.) 0 PROCESS - Coordination of Care X - Simple Patient / Family Education for ongoing care 1 15 []  - Complex (extensive) Patient / Family Education for ongoing care 0 []  - Staff obtains Programmer, systems, Records, Test Results / Process Orders 0 []  - Staff telephones HHA, Nursing Homes / Clarify orders / etc 0 []  - Routine Transfer to another Facility (non-emergent condition) 0 Brad Taylor, Brad Taylor ([id number]) []  - Routine Hospital Admission (non-emergent condition) 0 []  - New Admissions / Biomedical engineer / Ordering NPWT, Apligraf, etc. 0 []  - Emergency Hospital Admission (emergent condition) 0 []  - Simple Discharge Coordination 0 []  - Complex (extensive) Discharge Coordination 0 PROCESS - Special Needs []  - Pediatric / Minor Patient Management 0 []  - Isolation Patient Management 0 []  - Hearing / Language / Visual special needs 0 []  - Assessment of Community assistance (transportation, D/C planning, etc.) 0 []  -  Additional assistance / Altered mentation 0 []  - Support Surface(s) Assessment (bed, cushion, seat, etc.) 0 INTERVENTIONS - Wound Cleansing / Measurement X - Simple Wound Cleansing -  one wound 1 5 []  - Complex Wound Cleansing - multiple wounds 0 X - Wound Imaging (photographs - any number of wounds) 1 5 []  - Wound Tracing (instead of photographs) 0 X - Simple Wound Measurement - one wound 1 5 []  - Complex Wound Measurement - multiple wounds 0 INTERVENTIONS - Wound Dressings X - Small Wound Dressing one or multiple wounds 1 10 []  - Medium Wound Dressing one or multiple wounds 0 []  - Large Wound Dressing one or multiple wounds 0 []  - Application of Medications - topical 0 []  - Application of Medications - injection 0 INTERVENTIONS - Miscellaneous []  - External ear exam 0 Brad Taylor, Brad L. ([id number]) []  - Specimen Collection (cultures, biopsies, blood, body fluids, etc.) 0 []  - Specimen(s) / Culture(s) sent or taken to Lab for analysis 0 []  - Patient Transfer (multiple staff / Harrel Lemon Lift / Similar devices) 0 []  - Simple Staple / Suture removal (25 or less) 0 []  - Complex Staple / Suture removal (26 or more) 0 []  - Hypo / Hyperglycemic Management (close monitor of Blood Glucose) 0 []  - Ankle / Brachial Index (ABI) - do not check if billed separately 0 X - Vital Signs 1 5 Has the patient been seen at the hospital within the last three years: Yes Total Score: 70 Level Of Care: New/Established - Level 2 Electronic Signature(s) Signed: 09/05/2016 3:37:28 PM By: Regan Lemming BSN, RN Entered By: Regan Lemming on 09/05/2016 [place] ([id number]) -------------------------------------------------------------------------------- Encounter Discharge Information Details Patient Name: Brad Taylor. Date of Service: 09/05/2016 11:15 AM Medical Record Number: [id number] Patient Account Number: [id number] Date of Birth/Sex: 06-19-1951 (66 y.o. Male) Treating RN: Baruch Gouty, RN, BSN,  [place] Primary Care Charvis Lightner: Jenna Luo Other Clinician: Referring Kayce Betty: Jenna Luo Treating Clifton Kovacic/Extender: Frann Rider in Treatment: 3 Encounter Discharge Information Items Schedule Follow-up Appointment: No Medication Reconciliation completed No and provided to Patient/Care Obe Ahlers: Provided on Clinical Summary of Care: 09/05/2016 Form Type Recipient Paper Patient [employer] Electronic Signature(s) Signed: 09/05/2016 12:00:00 PM By: Ruthine Dose Entered By: Ruthine Dose on 09/05/2016 12:00:00 Brad Taylor ([id number]) -------------------------------------------------------------------------------- Lower Extremity Assessment Details Patient Name: Brad Taylor, SAYANI. Date of Service: 09/05/2016 11:15 AM Medical Record Number: [id number] Patient Account Number: [id number] Date of Birth/Sex: 1951-05-15 (66 y.o. Male) Treating RN: Baruch Gouty, RN, BSN, [place] Primary Care Tanise Russman: Jenna Luo Other Clinician: Referring Dara Beidleman: Jenna Luo Treating Tehillah Cipriani/Extender: Frann Rider in Treatment: 3 Edema Assessment Assessed: [Left: No] [Right: No] E[Left: dema] [Right: :] Calf Left: Right: Point of Measurement: 39 cm From Medial Instep cm 42.3 cm Ankle Left: Right: Point of Measurement: 9 cm From Medial Instep cm 25 cm Vascular Assessment Claudication: Claudication Assessment [Right:None] Pulses: Dorsalis Pedis Palpable: [Right:Yes] Posterior Tibial Extremity colors, hair growth, and conditions: Extremity Color: [Right:Mottled] Hair Growth on Extremity: [Right:Yes] Temperature of Extremity: [Right:Warm] Capillary Refill: [Right:< 3 seconds] Electronic Signature(s) Signed: 09/05/2016 3:37:28 PM By: Regan Lemming BSN, RN Entered By: Regan Lemming on 09/05/2016 11:23:04 Brad Taylor ([id number]) -------------------------------------------------------------------------------- Multi Wound Chart Details Patient Name: Brad Taylor. Date of  Service: 09/05/2016 11:15 AM Medical Record Number: [id number] Patient Account Number: [id number] Date of Birth/Sex: 03-May-1951 (66 y.o. Male) Treating RN: Baruch Gouty, RN, BSN, [place] Primary Care Garret Teale: Jenna Luo Other Clinician: Referring Tanis Burnley: Jenna Luo Treating Cheryn Lundquist/Extender: Frann Rider in Treatment: 3 Vital Signs Height(in): 72 Pulse(bpm): 63 Weight(lbs): 250 Blood Pressure 131/71 (mmHg): Body Mass Index(BMI): 34 Temperature(F): 98.1 Respiratory Rate 18 (breaths/min): Photos: [1:No Photos] [N/A:N/A] Wound Location: [1:Right  Lower Leg - Lateral] [N/A:N/A] Wounding Event: [1:Trauma] [N/A:N/A] Primary Etiology: [1:Diabetic Wound/Ulcer of the Lower Extremity] [N/A:N/A] Comorbid History: [1:Chronic Obstructive Pulmonary Disease (COPD), Arrhythmia, Hypertension, Myocardial Infarction, Type II Diabetes] [N/A:N/A] Date Acquired: [1:05/14/2016] [N/A:N/A] Weeks of Treatment: [1:3] [N/A:N/A] Wound Status: [1:Open] [N/A:N/A] Measurements L x W x D 6.5x2.5x0.1 [N/A:N/A] (cm) Area (cm) : [1:12.763] [N/A:N/A] Volume (cm) : [1:1.276] [N/A:N/A] % Reduction in Area: [1:12.60%] [N/A:N/A] % Reduction in Volume: 12.70% [N/A:N/A] Classification: [1:Grade 1] [N/A:N/A] Exudate Amount: [1:Medium] [N/A:N/A] Exudate Type: [1:Serosanguineous] [N/A:N/A] Exudate Color: [1:red, brown] [N/A:N/A] Wound Margin: [1:Flat and Intact] [N/A:N/A] Granulation Amount: [1:Medium (34-66%)] [N/A:N/A] Granulation Quality: [1:Pink, Pale] [N/A:N/A] Necrotic Amount: [1:Small (1-33%)] [N/A:N/A] Exposed Structures: [1:Fat Layer (Subcutaneous Tissue) Exposed: Yes] [N/A:N/A] Fascia: No Tendon: No Muscle: No Joint: No Bone: No Epithelialization: Small (1-33%) N/A N/A Periwound Skin Texture: Scarring: Yes N/A N/A Excoriation: No Induration: No Callus: No Crepitus: No Rash: No Periwound Skin Maceration: Yes N/A N/A Moisture: Dry/Scaly: No Periwound Skin Color: Hemosiderin Staining:  Yes N/A N/A Mottled: Yes Atrophie Blanche: No Cyanosis: No Ecchymosis: No Erythema: No Pallor: No Rubor: No Temperature: No Abnormality N/A N/A Tenderness on Yes N/A N/A Palpation: Wound Preparation: Ulcer Cleansing: N/A N/A Rinsed/Irrigated with Saline, Other: surg scrub and water Topical Anesthetic Applied: Other: lidocaine 4% Treatment Notes Electronic Signature(s) Signed: 09/05/2016 12:17:10 PM By: Christin Fudge MD, FACS Entered By: Christin Fudge on 09/05/2016 12:17:10 Brad Taylor ([id number]) -------------------------------------------------------------------------------- [place] Details Patient Name: Brad Taylor, KOCOUREK. Date of Service: 09/05/2016 11:15 AM Medical Record Number: [id number] Patient Account Number: [id number] Date of Birth/Sex: May 28, 1951 (66 y.o. Male) Treating RN: Baruch Gouty, RN, BSN, [place] Har[place] Primary Care Carolyn Maniscalco: Jenna Luo Other Clinician: Referring Joslynne Klatt: Jenna Luo Treating Lareina Espino/Extender: Frann Rider in Treatment: 3 Active Inactive ` Orientation to the Wound Care Program Nursing Diagnoses: Knowledge deficit related to the wound healing center program Goals: Patient/caregiver will verbalize understanding of the [facility] Program Date Initiated: 08/15/2016 Target Resolution Date: 09/05/2016 Goal Status: Active Interventions: Provide education on orientation to the wound center Notes: ` Venous Leg Ulcer Nursing Diagnoses: Knowledge deficit related to disease process and management Potential for venous Insuffiency (use before diagnosis confirmed) Goals: Patient will maintain optimal edema control Date Initiated: 08/15/2016 Target Resolution Date: 09/05/2016 Goal Status: Active Patient/caregiver will verbalize understanding of disease process and disease management Date Initiated: 08/15/2016 Target Resolution Date: 09/05/2016 Goal Status: Active Verify adequate tissue perfusion prior to  therapeutic compression application Date Initiated: 08/15/2016 Target Resolution Date: 09/05/2016 Goal Status: Active Interventions: Assess peripheral edema status every visit. Compression as ordered Brad Taylor, Brad Taylor ([id number]) Provide education on venous insufficiency Treatment Activities: Therapeutic compression applied : 08/15/2016 Notes: ` Wound/Skin Impairment Nursing Diagnoses: Impaired tissue integrity Knowledge deficit related to ulceration/compromised skin integrity Goals: Patient/caregiver will verbalize understanding of skin care regimen Date Initiated: 08/15/2016 Target Resolution Date: 09/05/2016 Goal Status: Active Ulcer/skin breakdown will have a volume reduction of 30% by week 4 Date Initiated: 08/15/2016 Target Resolution Date: 09/05/2016 Goal Status: Active Ulcer/skin breakdown will have a volume reduction of 50% by week 8 Date Initiated: 08/15/2016 Target Resolution Date: 09/05/2016 Goal Status: Active Ulcer/skin breakdown will have a volume reduction of 80% by week 12 Date Initiated: 08/15/2016 Target Resolution Date: 09/05/2016 Goal Status: Active Ulcer/skin breakdown will heal within 14 weeks Date Initiated: 08/15/2016 Target Resolution Date: 09/05/2016 Goal Status: Active Interventions: Assess patient/caregiver ability to obtain necessary supplies Assess patient/caregiver ability to perform ulcer/skin care regimen upon admission and as needed Assess ulceration(s) every visit Provide  education on smoking Provide education on ulcer and skin care Treatment Activities: Referred to DME Meleny Tregoning for dressing supplies : 08/15/2016 Skin care regimen initiated : 08/15/2016 Topical wound management initiated : 08/15/2016 Notes: Brad Taylor, Brad Taylor ([id number]) Electronic Signature(s) Signed: 09/05/2016 3:37:28 PM By: Regan Lemming BSN, RN Entered By: Regan Lemming on 09/05/2016 [place], [place]  ([id number]) -------------------------------------------------------------------------------- Pain Assessment Details Patient Name: Brad Taylor, Brad L. Date of Service: 09/05/2016 11:15 AM Medical Record Number: [id number] Patient Account Number: [id number] Date of Birth/Sex: 04-11-1951 (66 y.o. Male) Treating RN: Baruch Gouty, RN, BSN, [place] [facility] Primary Care [place] Zylstra: Jenna Luo Other Clinician: Referring Takisha Pelle: Jenna Luo Treating Keyetta Hollingworth/Extender: Frann Rider in Treatment: 3 Active Problems Location of Pain Severity and Description of Pain Patient Has Paino No Site Locations With Dressing Change: No Pain Management and Medication Current Pain Management: Electronic Signature(s) Signed: 09/05/2016 3:37:28 PM By: Regan Lemming BSN, RN Entered By: Regan Lemming on 09/05/2016 11:22:26 Brad Taylor ([id number]) -------------------------------------------------------------------------------- Wound Assessment Details Patient Name: Brad Pain L. Date of Service: 09/05/2016 11:15 AM Medical Record Number: [id number] Patient Account Number: [id number] Date of Birth/Sex: February 01, 1951 (66 y.o. Male) Treating RN: Baruch Gouty, RN, BSN, [place] Primary Care Nakoma Gotwalt: Jenna Luo Other Clinician: Referring Aurianna Earlywine: Jenna Luo Treating Jovontae Banko/Extender: Frann Rider in Treatment: 3 Wound Status Wound Number: 1 Primary Diabetic Wound/Ulcer of the Lower Etiology: Extremity Wound Location: Right Lower Leg - Lateral Wound Open Wounding Event: Trauma Status: Date Acquired: 05/14/2016 Comorbid Chronic Obstructive Pulmonary Disease Weeks Of Treatment: 3 History: (COPD), Arrhythmia, Hypertension, Clustered Wound: No Myocardial Infarction, Type II Diabetes Photos Photo Uploaded By: Regan Lemming on 09/05/2016 15:35:29 Wound Measurements Length: (cm) 6.5 Width: (cm) 2.5 Depth: (cm) 0.1 Area: (cm) 12.763 Volume: (cm) 1.276 % Reduction in Area: 12.6% % Reduction in Volume:  12.7% Epithelialization: Small (1-33%) Tunneling: No Undermining: No Wound Description Classification: Grade 1 Wound Margin: Flat and Intact Exudate Amount: Medium Exudate Type: Serosanguineous Exudate Color: red, brown Foul Odor After Cleansing: No Slough/Fibrino Yes Wound Bed Granulation Amount: Medium (34-66%) Exposed Structure Granulation Quality: Pink, Pale Fascia Exposed: No Necrotic Amount: Small (1-33%) Fat Layer (Subcutaneous Tissue) Exposed: Yes Necrotic Quality: Adherent Slough Tendon Exposed: No Brad Taylor, Brad L. ([id number]) Muscle Exposed: No Joint Exposed: No Bone Exposed: No Periwound Skin Texture Texture Color No Abnormalities Noted: No No Abnormalities Noted: No Callus: No Atrophie Blanche: No Crepitus: No Cyanosis: No Excoriation: No Ecchymosis: No Induration: No Erythema: No Rash: No Hemosiderin Staining: Yes Scarring: Yes Mottled: Yes Pallor: No Moisture Rubor: No No Abnormalities Noted: No Dry / Scaly: No Temperature / Pain Maceration: Yes Temperature: No Abnormality Tenderness on Palpation: Yes Wound Preparation Ulcer Cleansing: Rinsed/Irrigated with Saline, Other: surg scrub and water, Topical Anesthetic Applied: Other: lidocaine 4%, Electronic Signature(s) Signed: 09/05/2016 3:37:28 PM By: Regan Lemming BSN, RN Entered By: Regan Lemming on 09/05/2016 11:30:42 Brad Taylor ([id number]) -------------------------------------------------------------------------------- Vitals Details Patient Name: Brad Taylor. Date of Service: 09/05/2016 11:15 AM Medical Record Number: [id number] Patient Account Number: [id number] Date of Birth/Sex: [birth year] (66 y.o. Male) Treating RN: Baruch Gouty, RN, BSN, [place] Primary Care Shaquitta Burbridge: Jenna Luo Other Clinician: Referring Tarique Loveall: Jenna Luo Treating Vere Diantonio/Extender: Frann Rider in Treatment: 3 Vital Signs Time Taken: 11:22 Temperature (F): 98.1 Height (in): 72 Pulse (bpm):  63 Weight (lbs): 250 Respiratory Rate (breaths/min): 18 Body Mass Index (BMI): 33.9 Blood Pressure (mmHg): 131/71 Reference Range: 80 - 120 mg / dl Electronic Signature(s) Signed: 09/05/2016 3:37:28 PM By: Regan Lemming BSN, RN Entered By: Regan Lemming on 09/05/2016  11:23:16 

## 2016-09-12 ENCOUNTER — Encounter: Payer: Commercial Managed Care - HMO | Admitting: Surgery

## 2016-09-12 DIAGNOSIS — I10 Essential (primary) hypertension: Secondary | ICD-10-CM | POA: Diagnosis not present

## 2016-09-12 DIAGNOSIS — E11622 Type 2 diabetes mellitus with other skin ulcer: Secondary | ICD-10-CM | POA: Diagnosis not present

## 2016-09-12 DIAGNOSIS — Z8673 Personal history of transient ischemic attack (TIA), and cerebral infarction without residual deficits: Secondary | ICD-10-CM | POA: Diagnosis not present

## 2016-09-12 DIAGNOSIS — I252 Old myocardial infarction: Secondary | ICD-10-CM | POA: Diagnosis not present

## 2016-09-12 DIAGNOSIS — E039 Hypothyroidism, unspecified: Secondary | ICD-10-CM | POA: Diagnosis not present

## 2016-09-12 DIAGNOSIS — I872 Venous insufficiency (chronic) (peripheral): Secondary | ICD-10-CM | POA: Diagnosis not present

## 2016-09-12 DIAGNOSIS — L97212 Non-pressure chronic ulcer of right calf with fat layer exposed: Secondary | ICD-10-CM | POA: Diagnosis not present

## 2016-09-12 DIAGNOSIS — F17218 Nicotine dependence, cigarettes, with other nicotine-induced disorders: Secondary | ICD-10-CM | POA: Diagnosis not present

## 2016-09-12 DIAGNOSIS — J449 Chronic obstructive pulmonary disease, unspecified: Secondary | ICD-10-CM | POA: Diagnosis not present

## 2016-09-12 DIAGNOSIS — L97812 Non-pressure chronic ulcer of other part of right lower leg with fat layer exposed: Secondary | ICD-10-CM | POA: Diagnosis not present

## 2016-09-13 NOTE — Progress Notes (Signed)
SEBERT, ADDLEMAN ([id number]) Visit Report for 09/12/2016 Chief Complaint Document Details Patient Name: Brad Taylor, Brad Taylor. Date of Service: 09/12/2016 11:15 AM Medical Record Number: [id number] Patient Account Number: [id number] Date of Birth/Sex: 15-Feb-1951 (66 y.o. Male) Treating RN: Baruch Gouty, RN, BSN, Velva Harman Primary Care Provider: Jenna Luo Other Clinician: Referring Provider: Jenna Luo Treating Provider/Extender: Frann Rider in Treatment: 4 Information Obtained from: Patient Chief Complaint Patient presents for treatment of an open ulcer due to venous insufficiency along with diabetes mellitus, to his right lower extremity which she's had for about 3 months Electronic Signature(s) Signed: 09/12/2016 11:56:38 AM By: Christin Fudge MD, FACS Entered By: Christin Fudge on 09/12/2016 11:56:37 Brad Taylor ([id number]) -------------------------------------------------------------------------------- HPI Details Patient Name: Brad Taylor. Date of Service: 09/12/2016 11:15 AM Medical Record Number: [id number] Patient Account Number: [id number] Date of Birth/Sex: 05-Aug-1951 (66 y.o. Male) Treating RN: Baruch Gouty, RN, BSN, [place] Primary Care Provider: Jenna Luo Other Clinician: Referring Provider: Jenna Luo Treating Provider/Extender: Frann Rider in Treatment: 4 History of Present Illness Location: right lower extremity Quality: Patient reports experiencing a dull pain to affected area(s). Severity: Patient states wound are getting worse. Duration: Patient has had the wound for > 3 months prior to seeking treatment at the wound center Timing: Pain in wound is Intermittent (comes and goes Context: The wound occurred when the patient had a lacerated wound which was treated with sutures which gaped and had an open ulceration Modifying Factors: Other treatment(s) tried include:been having on and Unna's boots applied by his PCP in the office Associated Signs and  Symptoms: Patient reports having increase swelling. HPI Description: 66 year old gentleman with a past medical history of stroke, diabetes mellitus2 with hyperglycemia, CVA, hemiplegia, ahasia, cellulitis of the left gluteal area and is a smoker and smokes a packet of cigarettes a day. His past medical history also significant for COPD, thyroid disease and is status post cholecystectomy and status post knee surgery in October 2017 the patient had a laceration to his right lateral shin and had a deep tear which was sutured with 4-0 Ethilon sutures but these opened out a cause of his chronic venous insufficiency and severe edema and the patient was seen by his PCP and put on Bactrim. she was also advised to do dressings with Silvadene and Unna's boots. We don't have notes from the [facility] in [place] but he says he's had a thorough workup done both arterial and venous and was told that he has venous insufficiency and was to wear compression stockings of 30-40 mm variety. We'll try and obtain these notes. He also gives a history of a lung mass which was a cancer and treated with resection but no chemotherapy or radiation. 08/29/2016 -- not yet received any of his notes from the [facility] 09/05/2016 -- in spite of every effort on the patient's part and are part we have not been able to get any reports regarding his arterial and venous workup. At this stage I'm going to request fresh arterial duplex studies and venous reflux studies. 09/12/2016 -- the patient has not yet got his arterial and venous duplex study reports and refuses to do these tests again. He says he will get the reports back in about 2 weeks' time. He also says he has had a lot of pain and this lower extremity using Hydrofera Blue and asks to change the dressing material Electronic Signature(s) Signed: 09/12/2016 11:57:53 AM By: Christin Fudge MD, FACS Entered By: Christin Fudge on 09/12/2016 11:57:52 Renee Pain  L.  ([id number]) -------------------------------------------------------------------------------- Physical Exam Details Patient Name: Brad Taylor. Date of Service: 09/12/2016 11:15 AM Medical Record Number: [id number] Patient Account Number: [id number] Date of Birth/Sex: Mar 04, 1951 (66 y.o. Male) Treating RN: Baruch Gouty, RN, BSN, Velva Harman Primary Care Provider: Jenna Luo Other Clinician: Referring Provider: Jenna Luo Treating Provider/Extender: Frann Rider in Treatment: 4 Constitutional . Pulse regular. Respirations normal and unlabored. Afebrile. . Eyes Nonicteric. Reactive to light. Ears, Nose, Mouth, and Throat Lips, teeth, and gums WNL.Marland Kitchen Moist mucosa without lesions. Neck supple and nontender. No palpable supraclavicular or cervical adenopathy. Normal sized without goiter. Respiratory WNL. No retractions.. Breath sounds WNL, No rubs, rales, rhonchi, or wheeze.. Cardiovascular Heart rhythm and rate regular, no murmur or gallop.. Pedal Pulses WNL. No clubbing, cyanosis or edema. Lymphatic No adneopathy. No adenopathy. No adenopathy. Musculoskeletal Adexa without tenderness or enlargement.. Digits and nails w/o clubbing, cyanosis, infection, petechiae, ischemia, or inflammatory conditions.. Integumentary (Hair, Skin) No suspicious lesions. No crepitus or fluctuance. No peri-wound warmth or erythema. No masses.Marland Kitchen Psychiatric Judgement and insight Intact.. No evidence of depression, anxiety, or agitation.. Notes the lymphedema persists and the patient's wound has significant debris but he will not allow sharp debridement today as he is extremely tender in spite of the 4% lidocaine cream Electronic Signature(s) Signed: 09/12/2016 11:58:31 AM By: Christin Fudge MD, FACS Entered By: Christin Fudge on 09/12/2016 11:58:31 Brad Taylor ([id number]) -------------------------------------------------------------------------------- Physician Orders Details Patient Name: Brad Taylor. Date of Service: 09/12/2016 11:15 AM Medical Record Number: [id number] Patient Account Number: [id number] Date of Birth/Sex: 12-17-1950 (66 y.o. Male) Treating RN: Baruch Gouty, RN, BSN, [place] Primary Care Provider: Jenna Luo Other Clinician: Referring Provider: Jenna Luo Treating Provider/Extender: Frann Rider in Treatment: 4 Verbal / Phone Orders: No Diagnosis Coding Wound Cleansing Wound #1 Right,Lateral Lower Leg o Cleanse wound with mild soap and water - in clinic Anesthetic Wound #1 Right,Lateral Lower Leg o Topical Lidocaine 4% cream applied to wound bed prior to debridement Primary Wound Dressing Wound #1 Right,Lateral Lower Leg o Other: - hydrogel and sorbact Secondary Dressing Wound #1 Right,Lateral Lower Leg o ABD pad o Kerlix and Coban Dressing Change Frequency Wound #1 Right,Lateral Lower Leg o Change dressing every week Follow-up Appointments Wound #1 Right,Lateral Lower Leg o Return Appointment in 1 week. Edema Control Wound #1 Right,Lateral Lower Leg o Kerlix and Coban - Right Lower Extremity o Patient to wear own compression stockings o Elevate legs to the level of the heart and pump ankles as often as possible Additional Orders / Instructions Wound #1 Right,Lateral Lower Leg o Stop Smoking o Increase protein intake. RAYMIE, PENTA ([id number]) o Activity as tolerated o Other: - ZINC, VIT A, VIT C, MVI INCLUDE THESE IN YOU DIET Electronic Signature(s) Signed: 09/12/2016 4:21:54 PM By: Christin Fudge MD, FACS Signed: 09/12/2016 4:38:12 PM By: Regan Lemming BSN, RN Entered By: Regan Lemming on 09/12/2016 11:46:59 Brad Taylor ([id number]) -------------------------------------------------------------------------------- Problem List Details Patient Name: ROTH, HOPF. Date of Service: 09/12/2016 11:15 AM Medical Record Number: [id number] Patient Account Number: [id number] Date of Birth/Sex: 23-Feb-1951 (66  y.o. 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 Date of Service: 09/12/2016 11:15 AM Medical Record Number: [id number] Patient Account Number: [id number] Date of Birth/Sex: 07-Sep-1950 (66 y.o. Male) Treating RN: Baruch Gouty, RN, BSN, Velva Harman Primary Care Provider: Jenna Luo Other Clinician: Referring Provider: Jenna Luo Treating Provider/Extender: Frann Rider in Treatment: 4 Subjective Chief Complaint Information obtained from Patient Patient presents for treatment of an open ulcer due to venous insufficiency along with diabetes mellitus, to his right lower extremity which she's had for about 3 months History of Present Illness (HPI) The following HPI elements were documented for the patient's wound: Location: right lower extremity Quality: Patient reports experiencing a dull pain to affected area(s). Severity: Patient states wound are getting worse. Duration: Patient has had the wound for > 3 months prior to seeking treatment at the wound center Timing: Pain in wound is Intermittent (comes and goes Context:  The wound occurred when the patient had a lacerated wound which was treated with sutures which gaped and had an open ulceration Modifying Factors: Other treatment(s) tried include:been having on and Unna's boots applied by his PCP in the office Associated Signs and Symptoms: Patient reports having increase swelling. 66 year old gentleman with a past medical history of stroke, diabetes mellitus2 with hyperglycemia, CVA, hemiplegia, ahasia, cellulitis of the left gluteal area and is a smoker and smokes a packet of cigarettes a day. His past medical history also significant for COPD, thyroid disease and is status post cholecystectomy and status post knee surgery in October 2017 the patient had a laceration to his right lateral shin and had a deep tear which was sutured with 4-0 Ethilon sutures but these opened out a cause of his chronic venous insufficiency and severe edema and the patient was seen by his PCP and put on Bactrim. she was also advised to do dressings with Silvadene and Unna's boots. We don't have notes from the [facility] in [place] but he says he's had a thorough workup done both arterial and venous and was told that he has venous insufficiency and was to wear compression stockings of 30-40 mm variety. We'll try and obtain these notes. He also gives a history of a lung mass which was a cancer and treated with resection but no chemotherapy or radiation. 08/29/2016 -- not yet received any of his notes from [facility] 09/05/2016 -- in spite of every effort on the patient's part and are part we have not been able to get any reports regarding his arterial and venous workup. At this stage I'm going to request fresh arterial duplex studies and venous reflux studies. 09/12/2016 -- the patient has not yet got his arterial and venous duplex study reports and refuses to do these tests again. He says he will get the reports back in about 2 weeks' time. He also says he has had a [place],  [place] ([id number]) lot of pain and this lower extremity using Hydrofera Blue and asks to change the dressing material Objective Constitutional Pulse regular. Respirations normal and unlabored. Afebrile. Vitals Time Taken: 11:26 AM, Height: 72 in, Weight: 250 lbs, BMI: 33.9, Temperature: 98.0 F, Pulse: 62 bpm, Respiratory Rate: 18 breaths/min, Blood Pressure: 155/90 mmHg. Eyes Nonicteric. Reactive to light. Ears, Nose, Mouth, and Throat Lips, teeth, and gums WNL.Marland Kitchen Moist mucosa without lesions. Neck supple  and nontender. No palpable supraclavicular or cervical adenopathy. Normal sized without goiter. Respiratory WNL. No retractions.. Breath sounds WNL, No rubs, rales, rhonchi, or wheeze.. Cardiovascular Heart rhythm and rate regular, no murmur or gallop.. Pedal Pulses WNL. No clubbing, cyanosis or edema. Lymphatic No adneopathy. No adenopathy. No adenopathy. Musculoskeletal Adexa without tenderness or enlargement.. Digits and nails w/o clubbing, cyanosis, infection, petechiae, ischemia, or inflammatory conditions.Marland Kitchen Psychiatric Judgement and insight Intact.. No evidence of depression, anxiety, or agitation.. General Notes: the lymphedema persists and the patient's wound has significant debris but he will not allow sharp debridement today as he is extremely tender in spite of the 4% lidocaine cream Integumentary (Hair, Skin) No suspicious lesions. No crepitus or fluctuance. No peri-wound warmth or erythema. No masses.. Wound #1 status is Open. Original cause of wound was Trauma. The wound is located on the Right,Lateral Lower Leg. The wound measures 6.5cm length x 2.5cm width x 0.1cm depth; 12.763cm^2 area and Whiteford, Casy L. ([id number]) 1.276cm^3 volume. There is Fat Layer (Subcutaneous Tissue) Exposed exposed. There is no tunneling or undermining noted. There is a medium amount of serosanguineous drainage noted. The wound margin is flat and intact. There is medium (34-66%) pink,  pale granulation within the wound bed. There is a small (1-33%) amount of necrotic tissue within the wound bed including Adherent Slough. The periwound skin appearance exhibited: Scarring, Maceration, Hemosiderin Staining, Mottled. The periwound skin appearance did not exhibit: Callus, Crepitus, Excoriation, Induration, Rash, Dry/Scaly, Atrophie Blanche, Cyanosis, Ecchymosis, Pallor, Rubor, Erythema. Periwound temperature was noted as No Abnormality. The periwound has tenderness on palpation. Assessment Active Problems ICD-10 E11.622 - Type 2 diabetes mellitus with other skin ulcer L97.212 - Non-pressure chronic ulcer of right calf with fat layer exposed I87.2 - Venous insufficiency (chronic) (peripheral) F17.218 - Nicotine dependence, cigarettes, with other nicotine-induced disorders Plan Wound Cleansing: Wound #1 Right,Lateral Lower Leg: Cleanse wound with mild soap and water - in clinic Anesthetic: Wound #1 Right,Lateral Lower Leg: Topical Lidocaine 4% cream applied to wound bed prior to debridement Primary Wound Dressing: Wound #1 Right,Lateral Lower Leg: Other: - hydrogel and sorbact Secondary Dressing: Wound #1 Right,Lateral Lower Leg: ABD pad Kerlix and Coban Dressing Change Frequency: Wound #1 Right,Lateral Lower Leg: Change dressing every week Follow-up Appointments: Wound #1 Right,Lateral Lower Leg: Return Appointment in 1 week. Edema Control: Wound #1 Right,Lateral Lower Leg: BEACHER, GIGLIA ([id number]) Kerlix and Coban - Right Lower Extremity Patient to wear own compression stockings Elevate legs to the level of the heart and pump ankles as often as possible Additional Orders / Instructions: Wound #1 Right,Lateral Lower Leg: Stop Smoking Increase protein intake. Activity as tolerated Other: - ZINC, VIT A, VIT C, MVI INCLUDE THESE IN YOU DIET the patient is not willing to repeat his arterial and venous duplex studies and says we should wait 2 weeks before he  can get as the results from the [facility]. After review I have recommended: 1. Hydrogel with Sorbact with a 2 layer compression with Kerlix and Coban. 2. the patient also wants to use his own compression stockings which has helped him in the past -- on inspecting this these are only 15-20 mm pressure and I have said this is not adequate 3. Good control of his diabetes mellitus and I have asked him to see his PCP for further care 4. in discussed with them the need to completely give up smoking and I discussed the risk benefits and alternatives especially all the possibilities of improved wound healing 5. elevation  and exercise 6. Adequate protein, vitamin A, vitamin C and zinc 7. Regular visits to the wound center he and his wife have read all questions answered and will be compliant Electronic Signature(s) Signed: 09/12/2016 12:00:06 PM By: Christin Fudge MD, FACS Entered By: Christin Fudge on 09/12/2016 12:00:06 Brad Taylor ([id number]) -------------------------------------------------------------------------------- [place] Details Patient Name: Brad Taylor. Date of Service: 09/12/2016 Medical Record Number: [id number] Patient Account Number: [id number] Date of Birth/Sex: 08-25-1950 (66 y.o. Male) Treating RN: Baruch Gouty, RN, BSN, [place] Primary Care Provider: Jenna Luo Other Clinician: Referring Provider: Jenna Luo Treating Provider/Extender: Christin Fudge Service Line: Outpatient Weeks in Treatment: 4 Diagnosis Coding ICD-10 Codes Code Description E11.622 Type 2 diabetes mellitus with other skin ulcer L97.212 Non-pressure chronic ulcer of right calf with fat layer exposed I87.2 Venous insufficiency (chronic) (peripheral) F17.218 Nicotine dependence, cigarettes, with other nicotine-induced disorders Facility Procedures CPT4 Code: [id number] Description: [phone] - WOUND CARE VISIT-LEV 2 EST PT Modifier: Quantity: 1 Physician Procedures CPT4 Code Description: [id number] - WC PHYS LEVEL 3 - EST PT ICD-10 Description Diagnosis E11.622 Type 2 diabetes mellitus with other skin ulcer L97.212 Non-pressure chronic ulcer of right calf with fat l I87.2 Venous insufficiency (chronic)  (peripheral) F17.218 Nicotine dependence, cigarettes, with other nicotin Modifier: ayer exposed e-induced di Quantity: 1 sorders Electronic Signature(s) Signed: 09/12/2016 12:00:19 PM By: Christin Fudge MD, FACS Entered By: Christin Fudge on 09/12/2016 12:00:18

## 2016-09-13 NOTE — Progress Notes (Signed)
CALE, Taylor ([id number]) Visit Report for 09/12/2016 Arrival Information Details Patient Name: Brad Taylor, Brad Taylor. Date of Service: 09/12/2016 11:15 AM Medical Record Number: [id number] Patient Account Number: [id number] Date of Birth/Sex: October 20, 1950 (66 y.o. Male) Treating RN: Baruch Gouty, RN, BSN, Velva Harman Primary Care Sarah Zerby: Jenna Luo Other Clinician: Referring Briley Sulton: Jenna Luo Treating Gennie Dib/Extender: Frann Rider in Treatment: 4 Visit Information History Since Last Visit All ordered tests and consults were completed: No Patient Arrived: Ambulatory Added or deleted any medications: No Arrival Time: 11:19 Any new allergies or adverse reactions: No Accompanied By: wife Had a fall or experienced change in No Transfer Assistance: None activities of daily living that may affect Patient Identification Verified: Yes risk of falls: Secondary Verification Yes Signs or symptoms of abuse/neglect since last No Process Completed: visito Patient Requires No Hospitalized since last visit: No Transmission-Based Has Dressing in Place as Prescribed: Yes Precautions: Has Compression in Place as Prescribed: Yes Patient Has Alerts: Yes Pain Present Now: Yes Patient Alerts: Hgb A1c 8.4 ABI:Non- Compressible >220 Electronic Signature(s) Signed: 09/12/2016 4:38:12 PM By: Regan Lemming BSN, RN Entered By: Regan Lemming on 09/12/2016 11:24:26 Brad Taylor ([id number]) -------------------------------------------------------------------------------- Clinic Level of Care Assessment Details Patient Name: Brad Taylor. Date of Service: 09/12/2016 11:15 AM Medical Record Number: [id number] Patient Account Number: [id number] Date of Birth/Sex: [birth year] (66 y.o. Male) Treating RN: Baruch Gouty, RN, BSN, [place] Primary Care Duard Spiewak: Jenna Luo Other Clinician: Referring Jayzen Paver: Jenna Luo Treating Annaelle Kasel/Extender: Frann Rider in Treatment: 4 Clinic Level of Care  Assessment Items TOOL 4 Quantity Score []  - Use when only an EandM is performed on FOLLOW-UP visit 0 ASSESSMENTS - Nursing Assessment / Reassessment X - Reassessment of Co-morbidities (includes updates in patient status) 1 10 X - Reassessment of Adherence to Treatment Plan 1 5 ASSESSMENTS - Wound and Skin Assessment / Reassessment X - Simple Wound Assessment / Reassessment - one wound 1 5 []  - Complex Wound Assessment / Reassessment - multiple wounds 0 []  - Dermatologic / Skin Assessment (not related to wound area) 0 ASSESSMENTS - Focused Assessment []  - Circumferential Edema Measurements - multi extremities 0 []  - Nutritional Assessment / Counseling / Intervention 0 X - Lower Extremity Assessment (monofilament, tuning fork, pulses) 1 5 []  - Peripheral Arterial Disease Assessment (using hand held doppler) 0 ASSESSMENTS - Ostomy and/or Continence Assessment and Care []  - Incontinence Assessment and Management 0 []  - Ostomy Care Assessment and Management (repouching, etc.) 0 PROCESS - Coordination of Care X - Simple Patient / Family Education for ongoing care 1 15 []  - Complex (extensive) Patient / Family Education for ongoing care 0 []  - Staff obtains Programmer, systems, Records, Test Results / Process Orders 0 []  - Staff telephones HHA, Nursing Homes / Clarify orders / etc 0 []  - Routine Transfer to another Facility (non-emergent condition) 0 MIZRAIM, GANSON ([id number]) []  - Routine Hospital Admission (non-emergent condition) 0 []  - New Admissions / Biomedical engineer / Ordering NPWT, Apligraf, etc. 0 []  - Emergency Hospital Admission (emergent condition) 0 []  - Simple Discharge Coordination 0 []  - Complex (extensive) Discharge Coordination 0 PROCESS - Special Needs []  - Pediatric / Minor Patient Management 0 []  - Isolation Patient Management 0 []  - Hearing / Language / Visual special needs 0 []  - Assessment of Community assistance (transportation, D/C planning, etc.) 0 []  -  Additional assistance / Altered mentation 0 []  - Support Surface(s) Assessment (bed, cushion, seat, etc.) 0 INTERVENTIONS - Wound Cleansing / Measurement X - Simple Wound Cleansing -  one wound 1 5 []  - Complex Wound Cleansing - multiple wounds 0 X - Wound Imaging (photographs - any number of wounds) 1 5 []  - Wound Tracing (instead of photographs) 0 X - Simple Wound Measurement - one wound 1 5 []  - Complex Wound Measurement - multiple wounds 0 INTERVENTIONS - Wound Dressings X - Small Wound Dressing one or multiple wounds 1 10 []  - Medium Wound Dressing one or multiple wounds 0 []  - Large Wound Dressing one or multiple wounds 0 []  - Application of Medications - topical 0 []  - Application of Medications - injection 0 INTERVENTIONS - Miscellaneous []  - External ear exam 0 H[place]utt, Itzae L. ([id number]) []  - Specimen Collection (cultures, biopsies, blood, body fluids, etc.) 0 []  - Specimen(s) / Culture(s) sent or taken to Lab for analysis 0 []  - Patient Transfer (multiple staff / Harrel Lemon Lift / Similar devices) 0 []  - Simple Staple / Suture removal (25 or less) 0 []  - Complex Staple / Suture removal (26 or more) 0 []  - Hypo / Hyperglycemic Management (close monitor of Blood Glucose) 0 []  - Ankle / Brachial Index (ABI) - do not check if billed separately 0 X - Vital Signs 1 5 Has the patient been seen at the hospital within the last three years: Yes Total Score: 70 Level Of Care: New/Established - Level 2 Electronic Signature(s) Signed: 09/12/2016 4:38:12 PM By: Regan Lemming BSN, RN Entered By: Regan Lemming on 09/12/2016 11:59:10 Brad Taylor ([id number]) -------------------------------------------------------------------------------- Encounter Discharge Information Details Patient Name: Brad Taylor. Date of Service: 09/12/2016 11:15 AM Medical Record Number: [id number] Patient Account Number: [id number] Date of Birth/Sex: June 11, 1951 (66 y.o. Male) Treating RN: Baruch Gouty, RN, BSN,  Velva Harman Primary Care Kaulin Chaves: Jenna Luo Other Clinician: Referring Lillyonna Armstead: Jenna Luo Treating Enzley Kitchens/Extender: Frann Rider in Treatment: 4 Encounter Discharge Information Items Discharge Pain Level: 0 Discharge Condition: Stable Ambulatory Status: Ambulatory Discharge Destination: Home Transportation: Private Auto Accompanied By: Hansel Feinstein Schedule Follow-up Appointment: No Medication Reconciliation completed and provided to Patient/Care No Joelys Staubs: Provided on Clinical Summary of Care: 09/12/2016 Form Type Recipient Paper Patient [employer] Electronic Signature(s) Signed: 09/12/2016 4:38:12 PM By: Regan Lemming BSN, RN Previous Signature: 09/12/2016 11:57:44 AM Version By: Ruthine Dose Entered By: Regan Lemming on 09/12/2016 12:00:35 Brad Taylor ([id number]) -------------------------------------------------------------------------------- Lower Extremity Assessment Details Patient Name: GARDY, LETIZIA L. Date of Service: 09/12/2016 11:15 AM Medical Record Number: [id number] Patient Account Number: [id number] Date of Birth/Sex: 06/18/1951 (66 y.o. Male) Treating RN: Baruch Gouty, RN, BSN, [place] Primary Care Alizabeth Antonio: Jenna Luo Other Clinician: Referring Alexica Schlossberg: Jenna Luo Treating Gia Lusher/Extender: Frann Rider in Treatment: 4 Edema Assessment Assessed: [Left: No] [Right: No] E[Left: dema] [Right: :] Calf Left: Right: Point of Measurement: 39 cm From Medial Instep cm 42.3 cm Ankle Left: Right: Point of Measurement: 9 cm From Medial Instep cm 25 cm Vascular Assessment Claudication: Claudication Assessment [Right:None] Pulses: Dorsalis Pedis Palpable: [Right:No] Posterior Tibial Extremity colors, hair growth, and conditions: Extremity Color: [Right:Hyperpigmented] Hair Growth on Extremity: [Right:Yes] Temperature of Extremity: [Right:Warm] Capillary Refill: [Right:< 3 seconds] Toe Nail Assessment Left: Right: Thick: Yes Discolored:  Yes Deformed: No Improper Length and Hygiene: Yes Electronic Signature(s) Signed: 09/12/2016 4:38:12 PM By: Regan Lemming BSN, RN Entered By: Regan Lemming on 09/12/2016 11:41:46 Brad Taylor ([id number]) Stfort, Eric Form ([id number]) -------------------------------------------------------------------------------- Multi Wound Chart Details Patient Name: Brad Taylor. Date of Service: 09/12/2016 11:15 AM Medical Record Number: [id number] Patient Account Number: [id number] Date of Birth/Sex: [birth year] (66 y.o. Male) Treating RN: Baruch Gouty, RN,  BSN, Velva Harman Primary Care Milani Lowenstein: Jenna Luo Other Clinician: Referring Quentin Strebel: Jenna Luo Treating Talea Manges/Extender: Frann Rider in Treatment: 4 Vital Signs Height(in): 72 Pulse(bpm): 62 Weight(lbs): 250 Blood Pressure 155/90 (mmHg): Body Mass Index(BMI): 34 Temperature(F): 98.0 Respiratory Rate 18 (breaths/min): Photos: [1:No Photos] [N/A:N/A] Wound Location: [1:Right Lower Leg - Lateral] [N/A:N/A] Wounding Event: [1:Trauma] [N/A:N/A] Primary Etiology: [1:Diabetic Wound/Ulcer of the Lower Extremity] [N/A:N/A] Comorbid History: [1:Chronic Obstructive Pulmonary Disease (COPD), Arrhythmia, Hypertension, Myocardial Infarction, Type II Diabetes] [N/A:N/A] Date Acquired: [1:05/14/2016] [N/A:N/A] Weeks of Treatment: [1:4] [N/A:N/A] Wound Status: [1:Open] [N/A:N/A] Measurements L x W x D 6.5x2.5x0.1 [N/A:N/A] (cm) Area (cm) : [1:12.763] [N/A:N/A] Volume (cm) : [1:1.276] [N/A:N/A] % Reduction in Area: [1:12.60%] [N/A:N/A] % Reduction in Volume: 12.70% [N/A:N/A] Classification: [1:Grade 1] [N/A:N/A] Exudate Amount: [1:Medium] [N/A:N/A] Exudate Type: [1:Serosanguineous] [N/A:N/A] Exudate Color: [1:red, brown] [N/A:N/A] Wound Margin: [1:Flat and Intact] [N/A:N/A] Granulation Amount: [1:Medium (34-66%)] [N/A:N/A] Granulation Quality: [1:Pink, Pale] [N/A:N/A] Necrotic Amount: [1:Small (1-33%)] [N/A:N/A] Exposed  Structures: [1:Fat Layer (Subcutaneous Tissue) Exposed: Yes] [N/A:N/A] Fascia: No Tendon: No Muscle: No Joint: No Bone: No Epithelialization: Small (1-33%) N/A N/A Periwound Skin Texture: Scarring: Yes N/A N/A Excoriation: No Induration: No Callus: No Crepitus: No Rash: No Periwound Skin Maceration: Yes N/A N/A Moisture: Dry/Scaly: No Periwound Skin Color: Hemosiderin Staining: Yes N/A N/A Mottled: Yes Atrophie Blanche: No Cyanosis: No Ecchymosis: No Erythema: No Pallor: No Rubor: No Temperature: No Abnormality N/A N/A Tenderness on Yes N/A N/A Palpation: Wound Preparation: Ulcer Cleansing: N/A N/A Rinsed/Irrigated with Saline, Other: surg scrub and water Topical Anesthetic Applied: Other: lidocaine 4% Treatment Notes Electronic Signature(s) Signed: 09/12/2016 11:56:31 AM By: Christin Fudge MD, FACS Entered By: Christin Fudge on 09/12/2016 11:56:31 Brad Taylor ([id number]) -------------------------------------------------------------------------------- Jay Details Patient Name: RENLY, DEBAUN. Date of Service: 09/12/2016 11:15 AM Medical Record Number: [id number] Patient Account Number: [id number] Date of Birth/Sex: Jul 16, 1951 (66 y.o. Male) Treating RN: Baruch Gouty, RN, BSN, [place] Primary Care Roselind Klus: Jenna Luo Other Clinician: Referring Shawntel Farnworth: Jenna Luo Treating Nigel Wessman/Extender: Frann Rider in Treatment: 4 Active Inactive ` Orientation to the Wound Care Program Nursing Diagnoses: Knowledge deficit related to the wound healing center program Goals: Patient/caregiver will verbalize understanding of the [facility] Date Initiated: 08/15/2016 Target Resolution Date: 09/05/2016 Goal Status: Active Interventions: Provide education on orientation to the wound center Notes: ` Venous Leg Ulcer Nursing Diagnoses: Knowledge deficit related to disease process and management Potential for venous  Insuffiency (use before diagnosis confirmed) Goals: Patient will maintain optimal edema control Date Initiated: 08/15/2016 Target Resolution Date: 09/05/2016 Goal Status: Active Patient/caregiver will verbalize understanding of disease process and disease management Date Initiated: 08/15/2016 Target Resolution Date: 09/05/2016 Goal Status: Active Verify adequate tissue perfusion prior to therapeutic compression application Date Initiated: 08/15/2016 Target Resolution Date: 09/05/2016 Goal Status: Active Interventions: Assess peripheral edema status every visit. Compression as ordered BRET, KLOCKO ([id number]) Provide education on venous insufficiency Treatment Activities: Therapeutic compression applied : 08/15/2016 Notes: ` Wound/Skin Impairment Nursing Diagnoses: Impaired tissue integrity Knowledge deficit related to ulceration/compromised skin integrity Goals: Patient/caregiver will verbalize understanding of skin care regimen Date Initiated: 08/15/2016 Target Resolution Date: 09/05/2016 Goal Status: Active Ulcer/skin breakdown will have a volume reduction of 30% by week 4 Date Initiated: 08/15/2016 Target Resolution Date: 09/05/2016 Goal Status: Active Ulcer/skin breakdown will have a volume reduction of 50% by week 8 Date Initiated: 08/15/2016 Target Resolution Date: 09/05/2016 Goal Status: Active Ulcer/skin breakdown will have a volume reduction of 80% by week 12 Date Initiated: 08/15/2016  Target Resolution Date: 09/05/2016 Goal Status: Active Ulcer/skin breakdown will heal within 14 weeks Date Initiated: 08/15/2016 Target Resolution Date: 09/05/2016 Goal Status: Active Interventions: Assess patient/caregiver ability to obtain necessary supplies Assess patient/caregiver ability to perform ulcer/skin care regimen upon admission and as needed Assess ulceration(s) every visit Provide education on smoking Provide education on ulcer and skin care Treatment Activities: Referred to  DME Chandrea Zellman for dressing supplies : 08/15/2016 Skin care regimen initiated : 08/15/2016 Topical wound management initiated : 08/15/2016 Notes: KEIGO, SCHIMPF ([id number]) Electronic Signature(s) Signed: 09/12/2016 4:38:12 PM By: Regan Lemming BSN, RN Entered By: Regan Lemming on 09/12/2016 11:41:52 Brad Taylor ([id number]) -------------------------------------------------------------------------------- Pain Assessment Details Patient Name: Brad Taylor. Date of Service: 09/12/2016 11:15 AM Medical Record Number: [id number] Patient Account Number: [id number] Date of Birth/Sex: [birth year] (66 y.o. Male) Treating RN: Baruch Gouty, RN, BSN, [place] Primary Care Jalina Blowers: Jenna Luo Other Clinician: Referring Ashlay Altieri: Jenna Luo Treating Audrinna Sherman/Extender: Frann Rider in Treatment: 4 Active Problems Location of Pain Severity and Description of Pain Patient Has Paino Yes Site Locations Pain Location: Pain in Ulcers Rate the pain. Current Pain Level: 4 Character of Pain Describe the Pain: Aching, Burning, Tender Pain Management and Medication Current Pain Management: Medication: Yes Rest: Yes How does your pain impact your activities of daily livingo Sleep: Yes Bathing: Yes Appetite: Yes Relationship With Others: Yes Bladder Continence: Yes Emotions: Yes Bowel Continence: Yes Work: Yes Toileting: Yes Drive: Yes Dressing: Yes Hobbies: Yes Electronic Signature(s) Signed: 09/12/2016 4:38:12 PM By: Regan Lemming BSN, RN Entered By: Regan Lemming on 09/12/2016 11:25:57 Brad Taylor ([id number]) -------------------------------------------------------------------------------- Patient/Caregiver Education Details Patient Name: Brad Taylor. Date of Service: 09/12/2016 11:15 AM Medical Record Number: [id number] Patient Account Number: [id number] Date of Birth/Gender: [birth year] (66 y.o. Male) Treating RN: Baruch Gouty, RN, BSN, Velva Harman Primary Care Physician: Jenna Luo Other Clinician: Referring Physician: Jenna Luo Treating Physician/Extender: Frann Rider in Treatment: 4 Education Assessment Education Provided To: Patient Education Topics Provided Smoking and Wound Healing: Methods: Explain/Verbal Responses: State content correctly Venous: Methods: Explain/Verbal Responses: State content correctly Welcome To [facility]: Methods: Explain/Verbal Responses: State content correctly Wound/Skin Impairment: Methods: Explain/Verbal Responses: State content correctly Electronic Signature(s) Signed: 09/12/2016 4:38:12 PM By: Regan Lemming BSN, RN Entered By: Regan Lemming on 09/12/2016 12:00:56 Brad Taylor ([id number]) -------------------------------------------------------------------------------- Wound Assessment Details Patient Name: Renee Pain L. Date of Service: 09/12/2016 11:15 AM Medical Record Number: [id number] Patient Account Number: [id number] Date of Birth/Sex: 07-23-1951 (66 y.o. Male) Treating RN: Baruch Gouty, RN, BSN, [place] Primary Care Gulianna Hornsby: Jenna Luo Other Clinician: Referring Nevayah Faust: Jenna Luo Treating Milcah Dulany/Extender: Frann Rider in Treatment: 4 Wound Status Wound Number: 1 Primary Diabetic Wound/Ulcer of the Lower Etiology: Extremity Wound Location: Right Lower Leg - Lateral Wound Open Wounding Event: Trauma Status: Date Acquired: 05/14/2016 Comorbid Chronic Obstructive Pulmonary Disease Weeks Of Treatment: 4 History: (COPD), Arrhythmia, Hypertension, Clustered Wound: No Myocardial Infarction, Type II Diabetes Photos Photo Uploaded By: Regan Lemming on 09/12/2016 14:37:38 Wound Measurements Length: (cm) 6.5 Width: (cm) 2.5 Depth: (cm) 0.1 Area: (cm) 12.763 Volume: (cm) 1.276 % Reduction in Area: 12.6% % Reduction in Volume: 12.7% Epithelialization: Small (1-33%) Tunneling: No Undermining: No Wound Description Classification: Grade 1 Wound Margin: Flat  and Intact Exudate Amount: Medium Exudate Type: Serosanguineous Exudate Color: red, brown Foul Odor After Cleansing: No Slough/Fibrino Yes Wound Bed Granulation Amount: Medium (34-66%) Exposed Structure Granulation Quality: Pink, Pale Fascia Exposed: No Necrotic Amount: Small (1-33%) Fat Layer (Subcutaneous Tissue) Exposed: Yes Necrotic  Quality: Adherent Slough Tendon Exposed: No ROGER, ZARR. ([id number]) Muscle Exposed: No Joint Exposed: No Bone Exposed: No Periwound Skin Texture Texture Color No Abnormalities Noted: No No Abnormalities Noted: No Callus: No Atrophie Blanche: No Crepitus: No Cyanosis: No Excoriation: No Ecchymosis: No Induration: No Erythema: No Rash: No Hemosiderin Staining: Yes Scarring: Yes Mottled: Yes Pallor: No Moisture Rubor: No No Abnormalities Noted: No Dry / Scaly: No Temperature / Pain Maceration: Yes Temperature: No Abnormality Tenderness on Palpation: Yes Wound Preparation Ulcer Cleansing: Rinsed/Irrigated with Saline, Other: surg scrub and water, Topical Anesthetic Applied: Other: lidocaine 4%, Treatment Notes Wound #1 (Right, Lateral Lower Leg) 1. Cleansed with: Cleanse wound with antibacterial soap and water 4. Dressing Applied: Other dressing (specify in notes) 5. Secondary Dressing Applied ABD Pad 7. Secured with Tape Other (specify in notes) Notes [place] from toes to knee Electronic Signature(s) Signed: 09/12/2016 4:38:12 PM By: Regan Lemming BSN, RN Entered By: Regan Lemming on 09/12/2016 11:28:39 Brad Taylor ([id number]) -------------------------------------------------------------------------------- Vitals Details Patient Name: Brad Taylor. Date of Service: 09/12/2016 11:15 AM Medical Record Number: [id number] Patient Account Number: [id number] Date of Birth/Sex: 01-01-1951 (66 y.o. Male) Treating RN: Baruch Gouty, RN, BSN, [place] Primary Care Teddy Pena: Jenna Luo Other Clinician: Referring  Markiyah Gahm: Jenna Luo Treating Winnie Barsky/Extender: Frann Rider in Treatment: 4 Vital Signs Time Taken: 11:26 Temperature (F): 98.0 Height (in): 72 Pulse (bpm): 62 Weight (lbs): 250 Respiratory Rate (breaths/min): 18 Body Mass Index (BMI): 33.9 Blood Pressure (mmHg): 155/90 Reference Range: 80 - 120 mg / dl Electronic Signature(s) Signed: 09/12/2016 4:38:12 PM By: Regan Lemming BSN, RN Entered By: Regan Lemming on 09/12/2016 11:26:18

## 2016-09-19 ENCOUNTER — Encounter: Payer: Commercial Managed Care - HMO | Admitting: Surgery

## 2016-09-19 DIAGNOSIS — I872 Venous insufficiency (chronic) (peripheral): Secondary | ICD-10-CM | POA: Diagnosis not present

## 2016-09-19 DIAGNOSIS — I10 Essential (primary) hypertension: Secondary | ICD-10-CM | POA: Diagnosis not present

## 2016-09-19 DIAGNOSIS — E039 Hypothyroidism, unspecified: Secondary | ICD-10-CM | POA: Diagnosis not present

## 2016-09-19 DIAGNOSIS — J449 Chronic obstructive pulmonary disease, unspecified: Secondary | ICD-10-CM | POA: Diagnosis not present

## 2016-09-19 DIAGNOSIS — F17218 Nicotine dependence, cigarettes, with other nicotine-induced disorders: Secondary | ICD-10-CM | POA: Diagnosis not present

## 2016-09-19 DIAGNOSIS — Z8673 Personal history of transient ischemic attack (TIA), and cerebral infarction without residual deficits: Secondary | ICD-10-CM | POA: Diagnosis not present

## 2016-09-19 DIAGNOSIS — I252 Old myocardial infarction: Secondary | ICD-10-CM | POA: Diagnosis not present

## 2016-09-19 DIAGNOSIS — L97212 Non-pressure chronic ulcer of right calf with fat layer exposed: Secondary | ICD-10-CM | POA: Diagnosis not present

## 2016-09-19 DIAGNOSIS — E11622 Type 2 diabetes mellitus with other skin ulcer: Secondary | ICD-10-CM | POA: Diagnosis not present

## 2016-09-21 NOTE — Progress Notes (Signed)
DAILON, PERRIN ([id number]) Visit Report for 09/19/2016 Arrival Information Details Patient Name: Brad Taylor, Brad Taylor. Date of Service: 09/19/2016 11:15 AM Medical Record Number: [id number] Patient Account Number: [id number] Date of Birth/Sex: May 03, 1951 (66 y.o. Male) Treating RN: Ahmed Prima Primary Care Lezley Bedgood: Jenna Luo Other Clinician: Referring Shimeka Bacot: Jenna Luo Treating Dyke Weible/Extender: Frann Rider in Treatment: 5 Visit Information History Since Last Visit All ordered tests and consults were completed: No Patient Arrived: Ambulatory Added or deleted any medications: No Arrival Time: 11:34 Any new allergies or adverse reactions: No Accompanied By: wife Had a fall or experienced change in No Transfer Assistance: None activities of daily living that may affect Patient Identification Verified: Yes risk of falls: Secondary Verification Yes Signs or symptoms of abuse/neglect since last No Process Completed: visito Patient Requires No Hospitalized since last visit: No Transmission-Based Has Dressing in Place as Prescribed: Yes Precautions: Pain Present Now: No Patient Has Alerts: Yes Patient Alerts: Hgb A1c 8.4 ABI:Non- Compressible >220 Electronic Signature(s) Signed: 09/20/2016 4:03:50 PM By: Alric Quan Entered By: Alric Quan on 09/19/2016 11:35:15 Brad Taylor ([id number]) -------------------------------------------------------------------------------- Encounter Discharge Information Details Patient Name: Brad Taylor. Date of Service: 09/19/2016 11:15 AM Medical Record Number: [id number] Patient Account Number: [id number] Date of Birth/Sex: 10-Apr-1951 (66 y.o. Male) Treating RN: Ahmed Prima Primary Care Eryx Zane: Jenna Luo Other Clinician: Referring Indi Willhite: Jenna Luo Treating Klaudia Beirne/Extender: Frann Rider in Treatment: 5 Encounter Discharge Information Items Discharge Pain Level:  0 Discharge Condition: Stable Ambulatory Status: Ambulatory Discharge Destination: Home Transportation: Private Auto Accompanied By: wife Schedule Follow-up Appointment: Yes Medication Reconciliation completed and provided to Patient/Care No Vianney Kopecky: Provided on Clinical Summary of Care: 09/19/2016 Form Type Recipient Paper Patient [employer] Electronic Signature(s) Signed: 09/19/2016 12:07:53 PM By: Ruthine Dose Entered By: Ruthine Dose on 09/19/2016 12:07:53 Brad Taylor ([id number]) -------------------------------------------------------------------------------- Lower Extremity Assessment Details Patient Name: Brad Taylor. Date of Service: 09/19/2016 11:15 AM Medical Record Number: [id number] Patient Account Number: [id number] Date of Birth/Sex: [birth year] (66 y.o. Male) Treating RN: Ahmed Prima Primary Care Konstantinos Cordoba: Jenna Luo Other Clinician: Referring Treonna Klee: Jenna Luo Treating Dontreal Miera/Extender: Frann Rider in Treatment: 5 Edema Assessment Assessed: [Left: No] [Right: No] E[Left: dema] [Right: :] Calf Left: Right: Point of Measurement: 39 cm From Medial Instep cm 42.3 cm Ankle Left: Right: Point of Measurement: 9 cm From Medial Instep cm 25 cm Vascular Assessment Pulses: Dorsalis Pedis Palpable: [Right:Yes] Posterior Tibial Extremity colors, hair growth, and conditions: Extremity Color: [Right:Hyperpigmented] Temperature of Extremity: [Right:Warm] Capillary Refill: [Right:< 3 seconds] Electronic Signature(s) Signed: 09/20/2016 4:03:50 PM By: Alric Quan Entered By: Alric Quan on 09/19/2016 11:41:33 Brad Taylor, Brad Taylor Form ([id number]) -------------------------------------------------------------------------------- Multi Wound Chart Details Patient Name: Brad Taylor. Date of Service: 09/19/2016 11:15 AM Medical Record Number: [id number] Patient Account Number: [id number] Date of Birth/Sex: 04-19-1951 (66 y.o.  Male) Treating RN: Ahmed Prima Primary Care Caera Enwright: Jenna Luo Other Clinician: Referring Tyreisha Ungar: Jenna Luo Treating Leilani Cespedes/Extender: Frann Rider in Treatment: 5 Vital Signs Height(in): 72 Pulse(bpm): 60 Weight(lbs): 250 Blood Pressure 135/63 (mmHg): Body Mass Index(BMI): 34 Temperature(F): 98.1 Respiratory Rate 18 (breaths/min): Photos: [1:No Photos] [N/A:N/A] Wound Location: [1:Right Lower Leg - Lateral] [N/A:N/A] Wounding Event: [1:Trauma] [N/A:N/A] Primary Etiology: [1:Diabetic Wound/Ulcer of the Lower Extremity] [N/A:N/A] Comorbid History: [1:Chronic Obstructive Pulmonary Disease (COPD), Arrhythmia, Hypertension, Myocardial Infarction, Type II Diabetes] [N/A:N/A] Date Acquired: [1:05/14/2016] [N/A:N/A] Weeks of Treatment: [1:5] [N/A:N/A] Wound Status: [1:Open] [N/A:N/A] Measurements L x W x D 6.4x2x0.1 [N/A:N/A] (cm) Area (cm) : [1:10.053] [N/A:N/A] Volume (cm) : [1:1.005] [  N/A:N/A] % Reduction in Area: [1:31.20%] [N/A:N/A] % Reduction in Volume: 31.20% [N/A:N/A] Classification: [1:Grade 1] [N/A:N/A] Exudate Amount: [1:Large] [N/A:N/A] Exudate Type: [1:Serosanguineous] [N/A:N/A] Exudate Color: [1:red, brown] [N/A:N/A] Wound Margin: [1:Flat and Intact] [N/A:N/A] Granulation Amount: [1:Medium (34-66%)] [N/A:N/A] Granulation Quality: [1:Pink, Pale] [N/A:N/A] Necrotic Amount: [1:Small (1-33%)] [N/A:N/A] Exposed Structures: [1:Fat Layer (Subcutaneous Tissue) Exposed: Yes] [N/A:N/A] Fascia: No Tendon: No Muscle: No Joint: No Bone: No Epithelialization: Small (1-33%) N/A N/A Debridement: Debridement [id number] N/A N/A [id number]) Pre-procedure 11:48 N/A N/A Verification/Time Out Taken: Pain Control: Lidocaine 4% Topical N/A N/A Solution Tissue Debrided: Fibrin/Slough, Exudates, N/A N/A Subcutaneous Level: Skin/Subcutaneous N/A N/A Tissue Debridement Area (sq 12.8 N/A N/A cm): Instrument: Curette N/A N/A Bleeding: Minimum N/A  N/A Hemostasis Achieved: Pressure N/A N/A Procedural Pain: 0 N/A N/A Post Procedural Pain: 0 N/A N/A Debridement Treatment Procedure was tolerated N/A N/A Response: well Post Debridement 6.4x2x0.1 N/A N/A Measurements L x W x D (cm) Post Debridement 1.005 N/A N/A Volume: (cm) Periwound Skin Texture: Scarring: Yes N/A N/A Excoriation: No Induration: No Callus: No Crepitus: No Rash: No Periwound Skin Maceration: Yes N/A N/A Moisture: Dry/Scaly: No Periwound Skin Color: Hemosiderin Staining: Yes N/A N/A Mottled: Yes Atrophie Blanche: No Cyanosis: No Ecchymosis: No Erythema: No Pallor: No Rubor: No Temperature: No Abnormality N/A N/A Tenderness on Yes N/A N/A Palpation: Brad Taylor, Brad Taylor ([id number]) Wound Preparation: Ulcer Cleansing: N/A N/A Rinsed/Irrigated with Saline, Other: surg scrub and water Topical Anesthetic Applied: Other: lidocaine 4% Procedures Performed: Debridement N/A N/A Treatment Notes Wound #1 (Right, Lateral Lower Leg) 1. Cleansed with: Clean wound with Normal Saline 2. Anesthetic Topical Lidocaine 4% cream to wound bed prior to debridement 4. Dressing Applied: Hydrogel Other dressing (specify in notes) 5. Secondary Dressing Applied ABD Pad Dry Gauze 7. Secured with Tape Notes [place] from toes to knee, cutimed sorbact Electronic Signature(s) Signed: 09/19/2016 12:08:00 PM By: Christin Fudge MD, FACS Entered By: Christin Fudge on 09/19/2016 12:08:00 Brad Taylor ([id number]) -------------------------------------------------------------------------------- [place] Details Patient Name: Brad Taylor, Brad Taylor. Date of Service: 09/19/2016 11:15 AM Medical Record Number: [id number] Patient Account Number: [id number] Date of Birth/Sex: 06-16-1951 (66 y.o. Male) Treating RN: Ahmed Prima Primary Care Modean Mccullum: Jenna Luo Other Clinician: Referring Derren Suydam: Jenna Luo Treating Keydi Giel/Extender:  Frann Rider in Treatment: 5 Active Inactive ` Orientation to the Wound Care Program Nursing Diagnoses: Knowledge deficit related to the wound healing center program Goals: Patient/caregiver will verbalize understanding of the [place] Program Date Initiated: 08/15/2016 Target Resolution Date: 09/05/2016 Goal Status: Active Interventions: Provide education on orientation to the wound center Notes: ` Venous Leg Ulcer Nursing Diagnoses: Knowledge deficit related to disease process and management Potential for venous Insuffiency (use before diagnosis confirmed) Goals: Patient will maintain optimal edema control Date Initiated: 08/15/2016 Target Resolution Date: 09/05/2016 Goal Status: Active Patient/caregiver will verbalize understanding of disease process and disease management Date Initiated: 08/15/2016 Target Resolution Date: 09/05/2016 Goal Status: Active Verify adequate tissue perfusion prior to therapeutic compression application Date Initiated: 08/15/2016 Target Resolution Date: 09/05/2016 Goal Status: Active Interventions: Assess peripheral edema status every visit. Compression as ordered Brad Taylor, Brad Taylor ([id number]) Provide education on venous insufficiency Treatment Activities: Therapeutic compression applied : 08/15/2016 Notes: ` Wound/Skin Impairment Nursing Diagnoses: Impaired tissue integrity Knowledge deficit related to ulceration/compromised skin integrity Goals: Patient/caregiver will verbalize understanding of skin care regimen Date Initiated: 08/15/2016 Target Resolution Date: 09/05/2016 Goal Status: Active Ulcer/skin breakdown will have a volume reduction of 30% by week 4 Date Initiated: 08/15/2016 Target Resolution  Date: 09/05/2016 Goal Status: Active Ulcer/skin breakdown will have a volume reduction of 50% by week 8 Date Initiated: 08/15/2016 Target Resolution Date: 09/05/2016 Goal Status: Active Ulcer/skin breakdown will have a volume  reduction of 80% by week 12 Date Initiated: 08/15/2016 Target Resolution Date: 09/05/2016 Goal Status: Active Ulcer/skin breakdown will heal within 14 weeks Date Initiated: 08/15/2016 Target Resolution Date: 09/05/2016 Goal Status: Active Interventions: Assess patient/caregiver ability to obtain necessary supplies Assess patient/caregiver ability to perform ulcer/skin care regimen upon admission and as needed Assess ulceration(s) every visit Provide education on smoking Provide education on ulcer and skin care Treatment Activities: Referred to DME [employer] for dressing supplies : 08/15/2016 Skin care regimen initiated : 08/15/2016 Topical wound management initiated : 08/15/2016 Notes: Brad Taylor, Brad Taylor ([id number]) Electronic Signature(s) Signed: 09/20/2016 4:03:50 PM By: Alric Quan Entered By: Alric Quan on 09/19/2016 11:41:38 Brad Taylor ([id number]) -------------------------------------------------------------------------------- Pain Assessment Details Patient Name: Brad Taylor. Date of Service: 09/19/2016 11:15 AM Medical Record Number: [id number] Patient Account Number: [id number] Date of Birth/Sex: [birth year] (66 y.o. Male) Treating RN: Ahmed Prima Primary Care Dessiree Sze: Jenna Luo Other Clinician: Referring Ercole Georg: Jenna Luo Treating Deardra Hinkley/Extender: Frann Rider in Treatment: 5 Active Problems Location of Pain Severity and Description of Pain Patient Has Paino No Site Locations With Dressing Change: No Pain Management and Medication Current Pain Management: Electronic Signature(s) Signed: 09/20/2016 4:03:50 PM By: Alric Quan Entered By: Alric Quan on 09/19/2016 11:35:21 Brad Taylor ([id number]) -------------------------------------------------------------------------------- Patient/Caregiver Education Details Patient Name: Brad Taylor. Date of Service: 09/19/2016 11:15 AM Medical Record Number:  [id number] Patient Account Number: [id number] Date of Birth/Gender: 1951/03/14 (66 y.o. Male) Treating RN: Ahmed Prima Primary Care Physician: Jenna Luo Other Clinician: Referring Physician: Jenna Luo Treating Physician/Extender: Frann Rider in Treatment: 5 Education Assessment Education Provided To: Patient Education Topics Provided Wound/Skin Impairment: Handouts: Other: change dressing as ordered Methods: Demonstration, Explain/Verbal Responses: State content correctly Electronic Signature(s) Signed: 09/20/2016 4:03:50 PM By: Alric Quan Entered By: Alric Quan on 09/19/2016 11:53:45 Brad Taylor, Brad Taylor Form ([id number]) -------------------------------------------------------------------------------- Wound Assessment Details Patient Name: Brad Taylor. Date of Service: 09/19/2016 11:15 AM Medical Record Number: [id number] Patient Account Number: [id number] Date of Birth/Sex: 1950/12/19 (66 y.o. Male) Treating RN: Ahmed Prima Primary Care Leanette Eutsler: Jenna Luo Other Clinician: Referring Deshannon Hinchliffe: Jenna Luo Treating Hazim Treadway/Extender: Frann Rider in Treatment: 5 Wound Status Wound Number: 1 Primary Diabetic Wound/Ulcer of the Lower Etiology: Extremity Wound Location: Right Lower Leg - Lateral Wound Open Wounding Event: Trauma Status: Date Acquired: 05/14/2016 Comorbid Chronic Obstructive Pulmonary Disease Weeks Of Treatment: 5 History: (COPD), Arrhythmia, Hypertension, Clustered Wound: No Myocardial Infarction, Type II Diabetes Photos Photo Uploaded By: Alric Quan on 09/19/2016 16:13:17 Wound Measurements Length: (cm) 6.4 Width: (cm) 2 Depth: (cm) 0.1 Area: (cm) 10.053 Volume: (cm) 1.005 % Reduction in Area: 31.2% % Reduction in Volume: 31.2% Epithelialization: Small (1-33%) Tunneling: No Undermining: No Wound Description Classification: Grade 1 Wound Margin: Flat and Intact Exudate Amount:  Large Exudate Type: Serosanguineous Exudate Color: red, brown Foul Odor After Cleansing: No Slough/Fibrino Yes Wound Bed Granulation Amount: Medium (34-66%) Exposed Structure Granulation Quality: Pink, Pale Fascia Exposed: No Necrotic Amount: Small (1-33%) Fat Layer (Subcutaneous Tissue) Exposed: Yes Necrotic Quality: Adherent Slough Tendon Exposed: No Brad Taylor, Brad L. ([id number]) Muscle Exposed: No Joint Exposed: No Bone Exposed: No Periwound Skin Texture Texture Color No Abnormalities Noted: No No Abnormalities Noted: No Callus: No Atrophie Blanche: No Crepitus: No Cyanosis: No Excoriation: No Ecchymosis: No Induration: No Erythema: No Rash: No  Hemosiderin Staining: Yes Scarring: Yes Mottled: Yes Pallor: No Moisture Rubor: No No Abnormalities Noted: No Dry / Scaly: No Temperature / Pain Maceration: Yes Temperature: No Abnormality Tenderness on Palpation: Yes Wound Preparation Ulcer Cleansing: Rinsed/Irrigated with Saline, Other: surg scrub and water, Topical Anesthetic Applied: Other: lidocaine 4%, Treatment Notes Wound #1 (Right, Lateral Lower Leg) 1. Cleansed with: Clean wound with Normal Saline 2. Anesthetic Topical Lidocaine 4% cream to wound bed prior to debridement 4. Dressing Applied: Hydrogel Other dressing (specify in notes) 5. Secondary Dressing Applied ABD Pad Dry Gauze 7. Secured with Tape Notes [place] from toes to knee, cutimed sorbact Electronic Signature(s) Signed: 09/20/2016 4:03:50 PM By: Alric Quan Entered By: Alric Quan on 09/19/2016 11:39:58 Brad Taylor ([id number]) -------------------------------------------------------------------------------- Vitals Details Patient Name: Brad Taylor. Date of Service: 09/19/2016 11:15 AM Medical Record Number: [id number] Patient Account Number: [id number] Date of Birth/Sex: 1950-12-09 (66 y.o. Male) Treating RN: Ahmed Prima Primary Care Chyler Creely: Jenna Luo Other Clinician: Referring Priti Consoli: Jenna Luo Treating Kristel Durkee/Extender: Frann Rider in Treatment: 5 Vital Signs Time Taken: 11:35 Temperature (F): 98.1 Height (in): 72 Pulse (bpm): 60 Weight (lbs): 250 Respiratory Rate (breaths/min): 18 Body Mass Index (BMI): 33.9 Blood Pressure (mmHg): 135/63 Reference Range: 80 - 120 mg / dl Electronic Signature(s) Signed: 09/20/2016 4:03:50 PM By: Alric Quan Entered By: Alric Quan on 09/19/2016 11:37:12

## 2016-09-21 NOTE — Progress Notes (Signed)
SHELVIN, DRESS ([id number]) Visit Report for 09/19/2016 Chief Complaint Document Details Patient Name: Brad Taylor, Brad Taylor. Date of Service: 09/19/2016 11:15 AM Medical Record Number: [id number] Patient Account Number: [id number] Date of Birth/Sex: 26-Dec-1950 (66 y.o. Male) Treating RN: Ahmed Prima Primary Care Provider: Jenna Luo Other Clinician: Referring Provider: Jenna Luo Treating Provider/Extender: Frann Rider in Treatment: 5 Information Obtained from: Patient Chief Complaint Patient presents for treatment of an open ulcer due to venous insufficiency along with diabetes mellitus, to his right lower extremity which she's had for about 3 months Electronic Signature(s) Signed: 09/19/2016 12:08:34 PM By: Christin Fudge MD, FACS Entered By: Christin Fudge on 09/19/2016 12:08:34 Brad Taylor ([id number]) -------------------------------------------------------------------------------- Debridement Details Patient Name: Brad Taylor. Date of Service: 09/19/2016 11:15 AM Medical Record Number: [id number] Patient Account Number: [id number] Date of Birth/Sex: [birth year] (66 y.o. Male) Treating RN: Ahmed Prima Primary Care Provider: Jenna Luo Other Clinician: Referring Provider: Jenna Luo Treating Provider/Extender: Frann Rider in Treatment: 5 Debridement Performed for Wound #1 Right,Lateral Lower Leg Assessment: Performed By: Physician Christin Fudge, MD Debridement: Debridement Pre-procedure Yes - 11:48 Verification/Time Out Taken: Start Time: 11:49 Pain Control: Lidocaine 4% Topical Solution Level: Skin/Subcutaneous Tissue Total Area Debrided (L x 4 (cm) x 2 (cm) = 8 (cm) W): Tissue and other Viable, Non-Viable, Exudate, Fibrin/Slough, Subcutaneous material debrided: Instrument: Curette Bleeding: Minimum Hemostasis Achieved: Pressure End Time: 11:51 Procedural Pain: 4 Post Procedural Pain: 0 Response to Treatment:  Procedure was tolerated well Post Debridement Measurements of Total Wound Length: (cm) 6.4 Width: (cm) 2 Depth: (cm) 0.1 Volume: (cm) 1.005 Character of Wound/Ulcer Post Requires Further Debridement Debridement: Severity of Tissue Post Debridement: Fat layer exposed Post Procedure Diagnosis Same as Pre-procedure Electronic Signature(s) Signed: 09/19/2016 12:08:28 PM By: Christin Fudge MD, FACS Signed: 09/20/2016 4:03:50 PM By: Alric Quan Entered By: Christin Fudge on 09/19/2016 12:08:28 Brad Taylor ([id number] Taylor, Brad Taylor ([id number]) -------------------------------------------------------------------------------- HPI Details Patient Name: Brad Taylor, Brad Taylor. Date of Service: 09/19/2016 11:15 AM Medical Record Number: [id number] Patient Account Number: [id number] Date of Birth/Sex: Nov 25, 1950 (66 y.o. Male) Treating RN: Ahmed Prima Primary Care Provider: Jenna Luo Other Clinician: Referring Provider: Jenna Luo Treating Provider/Extender: Frann Rider in Treatment: 5 History of Present Illness Location: right lower extremity Quality: Patient reports experiencing a dull pain to affected area(s). Severity: Patient states wound are getting worse. Duration: Patient has had the wound for > 3 months prior to seeking treatment at the wound center Timing: Pain in wound is Intermittent (comes and goes Context: The wound occurred when the patient had a lacerated wound which was treated with sutures which gaped and had an open ulceration Modifying Factors: Other treatment(s) tried include:been having on and Unna's boots applied by his PCP in the office Associated Signs and Symptoms: Patient reports having increase swelling. HPI Description: 66 year old gentleman with a past medical history of stroke, diabetes mellitus2 with hyperglycemia, CVA, hemiplegia, ahasia, cellulitis of the left gluteal area and is a smoker and smokes a packet of cigarettes a  day. His past medical history also significant for COPD, thyroid disease and is status post cholecystectomy and status post knee surgery in October 2017 the patient had a laceration to his right lateral shin and had a deep tear which was sutured with 4-0 Ethilon sutures but these opened out a cause of his chronic venous insufficiency and severe edema and the patient was seen by his PCP and put on Bactrim. she was also advised to do dressings with Silvadene and Unna's  boots. We don't have notes from the [facility] in [place] but he says he's had a thorough workup done both arterial and venous and was told that he has venous insufficiency and was to wear compression stockings of 30-40 mm variety. We'll try and obtain these notes. He also gives a history of a lung mass which was a cancer and treated with resection but no chemotherapy or radiation. 08/29/2016 -- not yet received any of his notes from the [facility] 09/05/2016 -- in spite of every effort on the patient's part and are part we have not been able to get any reports regarding his arterial and venous workup. At this stage I'm going to request fresh arterial duplex studies and venous reflux studies. 09/12/2016 -- the patient has not yet got his arterial and venous duplex study reports and refuses to do these tests again. He says he will get the reports back in about 2 weeks' time. He also says he has had a lot of pain and this lower extremity using Hydrofera Blue and asks to change the dressing material. 09/19/2016 -- patient's wife did talk to the doctor at the [place] and they will send as the old report of arterial and venous duplex studies and if need be they will repeat it again. Rash request will be sent by [place]. Electronic Signature(s) Signed: 09/19/2016 12:13:57 PM By: Christin Fudge MD, FACS Entered By: Christin Fudge on 09/19/2016 12:13:56 Brad Taylor, Brad Taylor ([id number]) Brad Taylor, Brad Taylor  ([id number]) -------------------------------------------------------------------------------- Physical Exam Details Patient Name: Brad Taylor, Brad Taylor. Date of Service: 09/19/2016 11:15 AM Medical Record Number: [id number] Patient Account Number: [id number] Date of Birth/Sex: May 21, 1951 (66 y.o. Male) Treating RN: Ahmed Prima Primary Care Provider: Jenna Luo Other Clinician: Referring Provider: Jenna Luo Treating Provider/Extender: Frann Rider in Treatment: 5 Constitutional . Pulse regular. Respirations normal and unlabored. Afebrile. . Eyes Nonicteric. Reactive to light. Ears, Nose, Mouth, and Throat Lips, teeth, and gums WNL.Marland Kitchen Moist mucosa without lesions. Neck supple and nontender. No palpable supraclavicular or cervical adenopathy. Normal sized without goiter. Respiratory WNL. No retractions.. Breath sounds WNL, No rubs, rales, rhonchi, or wheeze.. Cardiovascular Heart rhythm and rate regular, no murmur or gallop.. Pedal Pulses WNL. No clubbing, cyanosis or edema. Chest Breasts symmetical and no nipple discharge.. Breast tissue WNL, no masses, lumps, or tenderness.. Lymphatic No adneopathy. No adenopathy. No adenopathy. Musculoskeletal Adexa without tenderness or enlargement.. Digits and nails w/o clubbing, cyanosis, infection, petechiae, ischemia, or inflammatory conditions.. Integumentary (Hair, Skin) No suspicious lesions. No crepitus or fluctuance. No peri-wound warmth or erythema. No masses.Marland Kitchen Psychiatric Judgement and insight Intact.. No evidence of depression, anxiety, or agitation.. Notes today we were able to debride his wound with a #3 curet and most of it has healthy granulation tissue once the debridement was done. The lower third could not be debrided as he was having a lot of tenderness. Electronic Signature(s) Signed: 09/19/2016 12:14:42 PM By: Christin Fudge MD, FACS Entered By: Christin Fudge on 09/19/2016 12:14:42 Brad Taylor  ([id number]) -------------------------------------------------------------------------------- Physician Orders Details Patient Name: Brad Taylor, Brad Taylor. Date of Service: 09/19/2016 11:15 AM Medical Record Number: [id number] Patient Account Number: [id number] Date of Birth/Sex: [birth year] (66 y.o. Male) Treating RN: Ahmed Prima Primary Care Provider: Jenna Luo Other Clinician: Referring Provider: Jenna Luo Treating Provider/Extender: Frann Rider in Treatment: 5 Verbal / Phone Orders: Yes Clinician: Pinkerton, Debi Read Back and Verified: Yes Diagnosis Coding Wound Cleansing Wound #1 Right,Lateral Lower Leg o Cleanse wound with mild soap and water - in  clinic Anesthetic Wound #1 Right,Lateral Lower Leg o Topical Lidocaine 4% cream applied to wound bed prior to debridement Primary Wound Dressing Wound #1 Right,Lateral Lower Leg o Other: - hydrogel and sorbact Secondary Dressing Wound #1 Right,Lateral Lower Leg o ABD pad o Dry Gauze o Kerlix and Coban Dressing Change Frequency Wound #1 Right,Lateral Lower Leg o Change dressing every week Follow-up Appointments Wound #1 Right,Lateral Lower Leg o Return Appointment in 1 week. Edema Control Wound #1 Right,Lateral Lower Leg o Kerlix and Coban - Right Lower Extremity - unna to anchor o Patient to wear own compression stockings o Elevate legs to the level of the heart and pump ankles as often as possible Additional Orders / Instructions Wound #1 Right,Lateral Lower Leg o Stop Smoking Brad Taylor, Brad L. ([id number]) o Increase protein intake. o Activity as tolerated o Other: - ZINC, VIT A, VIT C, MVI INCLUDE THESE IN YOU DIET Electronic Signature(s) Signed: 09/19/2016 4:07:08 PM By: Christin Fudge MD, FACS Signed: 09/20/2016 4:03:50 PM By: Alric Quan Entered By: Alric Quan on 09/19/2016 11:52:24 Brad Taylor  ([id number]) -------------------------------------------------------------------------------- Problem List Details Patient Name: Brad Taylor, Brad Taylor. Date of Service: 09/19/2016 11:15 AM Medical Record Number: [id number] Patient Account Number: [id number] Date of Birth/Sex: 1950-11-13 (66 y.o. 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 Date of Service: 09/19/2016 11:15 AM Medical Record Number: [id number] Patient Account Number: [id number] Date of Birth/Sex: Jun 26, 1951 (66 y.o. Male) Treating RN: Ahmed Prima Primary Care Provider: Jenna Luo Other Clinician: Referring Provider: Jenna Luo Treating Provider/Extender: Frann Rider in Treatment: 5 Subjective Chief Complaint Information obtained from Patient Patient presents for treatment of an open ulcer due to venous insufficiency along with diabetes mellitus, to his right lower extremity which she's had for about 3 months History of Present Illness (HPI) The following HPI elements were documented for the patient's wound: Location: right lower  extremity Quality: Patient reports experiencing a dull pain to affected area(s). Severity: Patient states wound are getting worse. Duration: Patient has had the wound for > 3 months prior to seeking treatment at the wound center Timing: Pain in wound is Intermittent (comes and goes Context: The wound occurred when the patient had a lacerated wound which was treated with sutures which gaped and had an open ulceration Modifying Factors: Other treatment(s) tried include:been having on and Unna's boots applied by his PCP in the office Associated Signs and Symptoms: Patient reports having increase swelling. 66 year old gentleman with a past medical history of stroke, diabetes mellitus2 with hyperglycemia, CVA, hemiplegia, ahasia, cellulitis of the left gluteal area and is a smoker and smokes a packet of cigarettes a day. His past medical history also significant for COPD, thyroid disease and is status post cholecystectomy and status post knee surgery in October 2017 the patient had a laceration to his right lateral shin and had a deep tear which was sutured with 4-0 Ethilon sutures but these opened  out a cause of his chronic venous insufficiency and severe edema and the patient was seen by his PCP and put on Bactrim. she was also advised to do dressings with Silvadene and Unna's boots. We don't have notes from the [facility] in [place] but he says he's had a thorough workup done both arterial and venous and was told that he has venous insufficiency and was to wear compression stockings of 30-40 mm variety. We'll try and obtain these notes. He also gives a history of a lung mass which was a cancer and treated with resection but no chemotherapy or radiation. 08/29/2016 -- not yet received any of his notes from the [facility] 09/05/2016 -- in spite of every effort on the patient's part and are part we have not been able to get any reports regarding his arterial and venous workup. At this stage I'm  going to request fresh arterial duplex studies and venous reflux studies. 09/12/2016 -- the patient has not yet got his arterial and venous duplex study reports and refuses to do these tests again. He says he will get the reports back in about 2 weeks' time. He also says he has had a CHACE, SNEARY. ([id number]) lot of pain and this lower extremity using Hydrofera Blue and asks to change the dressing material. 09/19/2016 -- patient's wife did talk to the doctor at the [place] and they will send as the old report of arterial and venous duplex studies and if need be they will repeat it again. Rash request will be sent by [place]. Objective Constitutional Pulse regular. Respirations normal and unlabored. Afebrile. Vitals Time Taken: 11:35 AM, Height: 72 in, Weight: 250 lbs, BMI: 33.9, Temperature: 98.1 F, Pulse: 60 bpm, Respiratory Rate: 18 breaths/min, Blood Pressure: 135/63 mmHg. Eyes Nonicteric. Reactive to light. Ears, Nose, Mouth, and Throat Lips, teeth, and gums WNL.Marland Kitchen Moist mucosa without lesions. Neck supple and nontender. No palpable supraclavicular or cervical adenopathy. Normal sized without goiter. Respiratory WNL. No retractions.. Breath sounds WNL, No rubs, rales, rhonchi, or wheeze.. Cardiovascular Heart rhythm and rate regular, no murmur or gallop.. Pedal Pulses WNL. No clubbing, cyanosis or edema. Chest Breasts symmetical and no nipple discharge.. Breast tissue WNL, no masses, lumps, or tenderness.. Lymphatic No adneopathy. No adenopathy. No adenopathy. Musculoskeletal Adexa without tenderness or enlargement.. Digits and nails w/o clubbing, cyanosis, infection, petechiae, ischemia, or inflammatory conditions.Marland Kitchen Psychiatric Judgement and insight Intact.. No evidence of depression, anxiety, or agitation.. General Notes: today we were able to debride his wound with a #3 curet and most of it has healthy granulation tissue once the debridement was done. The lower third could not be  debrided as he was having a lot of tenderness. Brad Taylor, Brad Taylor ([id number]) Integumentary (Hair, Skin) No suspicious lesions. No crepitus or fluctuance. No peri-wound warmth or erythema. No masses.. Wound #1 status is Open. Original cause of wound was Trauma. The wound is located on the Right,Lateral Lower Leg. The wound measures 6.4cm length x 2cm width x 0.1cm depth; 10.053cm^2 area and 1.005cm^3 volume. There is Fat Layer (Subcutaneous Tissue) Exposed exposed. There is no tunneling or undermining noted. There is a large amount of serosanguineous drainage noted. The wound margin is flat and intact. There is medium (34-66%) pink, pale granulation within the wound bed. There is a small (1-33%) amount of necrotic tissue within the wound bed including Adherent Slough. The periwound skin appearance exhibited: Scarring, Maceration, Hemosiderin Staining, Mottled. The periwound skin appearance did not exhibit: Callus, Crepitus, Excoriation, Induration, Rash, Dry/Scaly,  Atrophie Blanche, Cyanosis, Ecchymosis, Pallor, Rubor, Erythema. Periwound temperature was noted as No Abnormality. The periwound has tenderness on palpation. Assessment Active Problems ICD-10 E11.622 - Type 2 diabetes mellitus with other skin ulcer L97.212 - Non-pressure chronic ulcer of right calf with fat layer exposed I87.2 - Venous insufficiency (chronic) (peripheral) F17.218 - Nicotine dependence, cigarettes, with other nicotine-induced disorders Procedures Wound #1 Wound #1 is a Diabetic Wound/Ulcer of the Lower Extremity located on the Right,Lateral Lower Leg . There was a Skin/Subcutaneous Tissue Debridement [id number]) debridement with total area of 8 sq cm performed by Christin Fudge, MD. with the following instrument(s): Curette to remove Viable and Non-Viable tissue/material including Exudate, Fibrin/Slough, and Subcutaneous after achieving pain control using Lidocaine 4% Topical Solution. A time out was conducted  at 11:48, prior to the start of the procedure. A Minimum amount of bleeding was controlled with Pressure. The procedure was tolerated well with a pain level of 4 throughout and a pain level of 0 following the procedure. Post Debridement Measurements: 6.4cm length x 2cm width x 0.1cm depth; 1.005cm^3 volume. Character of Wound/Ulcer Post Debridement requires further debridement. Severity of Tissue Post Debridement is: Fat layer exposed. Post procedure Diagnosis Wound #1: Same as Pre-Procedure ITIEL, BORNER ([id number]) Plan Wound Cleansing: Wound #1 Right,Lateral Lower Leg: Cleanse wound with mild soap and water - in clinic Anesthetic: Wound #1 Right,Lateral Lower Leg: Topical Lidocaine 4% cream applied to wound bed prior to debridement Primary Wound Dressing: Wound #1 Right,Lateral Lower Leg: Other: - hydrogel and sorbact Secondary Dressing: Wound #1 Right,Lateral Lower Leg: ABD pad Dry Gauze Kerlix and Coban Dressing Change Frequency: Wound #1 Right,Lateral Lower Leg: Change dressing every week Follow-up Appointments: Wound #1 Right,Lateral Lower Leg: Return Appointment in 1 week. Edema Control: Wound #1 Right,Lateral Lower Leg: Kerlix and Coban - Right Lower Extremity - unna to anchor Patient to wear own compression stockings Elevate legs to the level of the heart and pump ankles as often as possible Additional Orders / Instructions: Wound #1 Right,Lateral Lower Leg: Stop Smoking Increase protein intake. Activity as tolerated Other: - ZINC, VIT A, VIT C, MVI INCLUDE THESE IN YOU DIET After review I have recommended: 1. Hydrogel with Sorbact with a 2 layer compression with Kerlix and Coban. he tolerated this well last week 2. Good control of his diabetes mellitus and I have asked him to see his PCP for further care 3. in discussed with them the need to completely give up smoking and I discussed the risk benefits and alternatives especially all the possibilities of  improved wound healing Rafferty, Deval L. ([id number]) 4. elevation and exercise 5. Adequate protein, vitamin A, vitamin C and zinc 6. Regular visits to the wound center 7. fresh requests for arterial and venous duplex studies are again given today and a request is also been submitted to receive his old notes and studies from 2014 he and his wife have read all questions answered and will be compliant Electronic Signature(s) Signed: 09/19/2016 12:16:15 PM By: Christin Fudge MD, FACS Entered By: Christin Fudge on 09/19/2016 12:16:15 Brad Taylor ([id number]) -------------------------------------------------------------------------------- [place] Details Patient Name: Brad Taylor. Date of Service: 09/19/2016 Medical Record Number: [id number] Patient Account Number: [id number] Date of Birth/Sex: Aug 27, 1950 (66 y.o. Male) Treating RN: Ahmed Prima Primary Care Provider: Jenna Luo Other Clinician: Referring Provider: Jenna Luo Treating Provider/Extender: Christin Fudge Service Line: Outpatient Weeks in Treatment: 5 Diagnosis Coding ICD-10 Codes Code Description E11.622 Type 2 diabetes mellitus with other skin ulcer L97.212 Non-pressure chronic ulcer  of right calf with fat layer exposed I87.2 Venous insufficiency (chronic) (peripheral) F17.218 Nicotine dependence, cigarettes, with other nicotine-induced disorders Facility Procedures CPT4 Code Description: [id number] - DEB SUBQ TISSUE 20 SQ CM/< ICD-10 Description Diagnosis E11.622 Type 2 diabetes mellitus with other skin ulcer L97.212 Non-pressure chronic ulcer of right calf with fat l I87.2 Venous insufficiency (chronic)  (peripheral) F17.218 Nicotine dependence, cigarettes, with other nicotin Modifier: ayer exposed e-induced di Quantity: 1 sorders Physician Procedures CPT4 Code Description: [id number] - WC PHYS SUBQ TISS 20 SQ CM ICD-10 Description Diagnosis E11.622 Type 2 diabetes mellitus with other skin  ulcer L97.212 Non-pressure chronic ulcer of right calf with fat l I87.2 Venous insufficiency (chronic)  (peripheral) F17.218 Nicotine dependence, cigarettes, with other nicotin Modifier: ayer exposed e-induced di Quantity: 1 sorders Electronic Signature(s) Signed: 09/19/2016 12:16:26 PM By: Christin Fudge MD, FACS Entered By: Christin Fudge on 09/19/2016 12:16:26

## 2016-09-26 ENCOUNTER — Encounter: Payer: Commercial Managed Care - HMO | Admitting: Surgery

## 2016-09-26 DIAGNOSIS — I10 Essential (primary) hypertension: Secondary | ICD-10-CM | POA: Diagnosis not present

## 2016-09-26 DIAGNOSIS — E11622 Type 2 diabetes mellitus with other skin ulcer: Secondary | ICD-10-CM | POA: Diagnosis not present

## 2016-09-26 DIAGNOSIS — Z8673 Personal history of transient ischemic attack (TIA), and cerebral infarction without residual deficits: Secondary | ICD-10-CM | POA: Diagnosis not present

## 2016-09-26 DIAGNOSIS — J449 Chronic obstructive pulmonary disease, unspecified: Secondary | ICD-10-CM | POA: Diagnosis not present

## 2016-09-26 DIAGNOSIS — I252 Old myocardial infarction: Secondary | ICD-10-CM | POA: Diagnosis not present

## 2016-09-26 DIAGNOSIS — L97212 Non-pressure chronic ulcer of right calf with fat layer exposed: Secondary | ICD-10-CM | POA: Diagnosis not present

## 2016-09-26 DIAGNOSIS — I872 Venous insufficiency (chronic) (peripheral): Secondary | ICD-10-CM | POA: Diagnosis not present

## 2016-09-26 DIAGNOSIS — F17218 Nicotine dependence, cigarettes, with other nicotine-induced disorders: Secondary | ICD-10-CM | POA: Diagnosis not present

## 2016-09-26 DIAGNOSIS — E039 Hypothyroidism, unspecified: Secondary | ICD-10-CM | POA: Diagnosis not present

## 2016-09-28 NOTE — Progress Notes (Signed)
MIKAEEL, BERDAN ([id number]) Visit Report for 09/26/2016 Arrival Information Details Patient Name: Brad Taylor, Brad Taylor. Date of Service: 09/26/2016 8:45 AM Medical Record Number: [id number] Patient Account Number: [id number] Date of Birth/Sex: [birth year] (66 y.o. Male) Treating RN: Ahmed Prima Primary Care Deshundra Waller: Jenna Luo Other Clinician: Referring Jelani Trueba: Jenna Luo Treating Caitlyn Buchanan/Extender: Frann Rider in Treatment: 6 Visit Information History Since Last Visit All ordered tests and consults were completed: No Patient Arrived: Ambulatory Added or deleted any medications: No Arrival Time: 08:40 Any new allergies or adverse reactions: No Accompanied By: wife Had a fall or experienced change in No Transfer Assistance: None activities of daily living that may affect Patient Identification Verified: Yes risk of falls: Secondary Verification Yes Signs or symptoms of abuse/neglect since last No Process Completed: visito Patient Requires No Hospitalized since last visit: No Transmission-Based Has Dressing in Place as Prescribed: Yes Precautions: Pain Present Now: No Patient Has Alerts: Yes Patient Alerts: Hgb A1c 8.4 ABI:Non- Compressible >220 Electronic Signature(s) Signed: 09/27/2016 4:53:33 PM By: Alric Quan Entered By: Alric Quan on 09/26/2016 08:41:07 Brad Taylor ([id number]) -------------------------------------------------------------------------------- Encounter Discharge Information Details Patient Name: Brad Taylor. Date of Service: 09/26/2016 8:45 AM Medical Record Number: [id number] Patient Account Number: [id number] Date of Birth/Sex: [birth year] (66 y.o. Male) Treating RN: Ahmed Prima Primary Care Alania Overholt: Jenna Luo Other Clinician: Referring Nissim Fleischer: Jenna Luo Treating Kristell Wooding/Extender: Frann Rider in Treatment: 6 Encounter Discharge Information Items Discharge Pain Level: 0 Discharge  Condition: Stable Ambulatory Status: Ambulatory Discharge Destination: Home Transportation: Private Auto Accompanied By: wife Schedule Follow-up Appointment: Yes Medication Reconciliation completed and provided to Patient/Care No Gibson Lad: Provided on Clinical Summary of Care: 09/26/2016 Form Type Recipient Paper Patient [employer] Electronic Signature(s) Signed: 09/26/2016 9:25:46 AM By: Ruthine Dose Entered By: Ruthine Dose on 09/26/2016 09:25:46 Blansett, Brad Taylor Form ([id number]) -------------------------------------------------------------------------------- Lower Extremity Assessment Details Patient Name: Brad Taylor. Date of Service: 09/26/2016 8:45 AM Medical Record Number: [id number] Patient Account Number: [id number] Date of Birth/Sex: 06-07-1951 (66 y.o. Male) Treating RN: Ahmed Prima Primary Care Terrel Manalo: Jenna Luo Other Clinician: Referring Anjelika Ausburn: Jenna Luo Treating Matan Steen/Extender: Frann Rider in Treatment: 6 Edema Assessment Assessed: [Left: No] [Right: No] E[Left: dema] [Right: :] Calf Left: Right: Point of Measurement: 39 cm From Medial Instep cm 42 cm Ankle Left: Right: Point of Measurement: 9 cm From Medial Instep cm 24.8 cm Vascular Assessment Pulses: Dorsalis Pedis Palpable: [Right:Yes] Posterior Tibial Extremity colors, hair growth, and conditions: Extremity Color: [Right:Hyperpigmented] Temperature of Extremity: [Right:Warm] Capillary Refill: [Right:< 3 seconds] Toe Nail Assessment Left: Right: Thick: Yes Discolored: Yes Deformed: Yes Improper Length and Hygiene: No Electronic Signature(s) Signed: 09/27/2016 4:53:33 PM By: Alric Quan Entered By: Alric Quan on 09/26/2016 08:53:26 Goates, Brad Taylor Form ([id number]) -------------------------------------------------------------------------------- Multi Wound Chart Details Patient Name: Brad Taylor. Date of Service: 09/26/2016 8:45 AM Medical Record Number:  [id number] Patient Account Number: [id number] Date of Birth/Sex: 1951/04/29 (66 y.o. Male) Treating RN: Ahmed Prima Primary Care Jiayi Lengacher: Jenna Luo Other Clinician: Referring Saraia Platner: Jenna Luo Treating Hilarie Sinha/Extender: Frann Rider in Treatment: 6 Vital Signs Height(in): 72 Pulse(bpm): 59 Weight(lbs): 250 Blood Pressure 127/69 (mmHg): Body Mass Index(BMI): 34 Temperature(F): 97.8 Respiratory Rate 18 (breaths/min): Photos: [N/A:N/A] Wound Location: Right Lower Leg - Lateral N/A N/A Wounding Event: Trauma N/A N/A Primary Etiology: Diabetic Wound/Ulcer of N/A N/A the Lower Extremity Comorbid History: Chronic Obstructive N/A N/A Pulmonary Disease (COPD), Arrhythmia, Hypertension, Myocardial Infarction, Type II Diabetes Date Acquired: 05/14/2016 N/A N/A Weeks of Treatment: 6 N/A N/A Wound Status:  Open N/A N/A Measurements L x W x D 5.9x2x0.1 N/A N/A (cm) Area (cm) : 9.268 N/A N/A Volume (cm) : 0.927 N/A N/A % Reduction in Area: 36.60% N/A N/A % Reduction in Volume: 36.60% N/A N/A Classification: Grade 1 N/A N/A Exudate Amount: Large N/A N/A Exudate Type: Serosanguineous N/A N/A Exudate Color: red, brown N/A N/A Eves, Dana L. ([id number]) Wound Margin: Flat and Intact N/A N/A Granulation Amount: Medium (34-66%) N/A N/A Granulation Quality: Pink, Pale N/A N/A Necrotic Amount: Medium (34-66%) N/A N/A Exposed Structures: Fat Layer (Subcutaneous N/A N/A Tissue) Exposed: Yes Fascia: No Tendon: No Muscle: No Joint: No Bone: No Epithelialization: Small (1-33%) N/A N/A Debridement: Debridement [id number] N/A N/A [id number]) Pre-procedure 09:12 N/A N/A Verification/Time Out Taken: Pain Control: Lidocaine 4% Topical N/A N/A Solution Tissue Debrided: Fibrin/Slough, Exudates, N/A N/A Subcutaneous Level: Skin/Subcutaneous N/A N/A Tissue Debridement Area (sq 11.8 N/A N/A cm): Instrument: Curette N/A N/A Bleeding: Minimum N/A N/A Hemostasis  Achieved: Pressure N/A N/A Procedural Pain: 0 N/A N/A Post Procedural Pain: 0 N/A N/A Debridement Treatment Procedure was tolerated N/A N/A Response: well Post Debridement 5.9x2x0.1 N/A N/A Measurements L x W x D (cm) Post Debridement 0.927 N/A N/A Volume: (cm) Periwound Skin Texture: Scarring: Yes N/A N/A Excoriation: No Induration: No Callus: No Crepitus: No Rash: No Periwound Skin Maceration: Yes N/A N/A Moisture: Dry/Scaly: No Periwound Skin Color: Hemosiderin Staining: Yes N/A N/A Mottled: Yes Atrophie Blanche: No Cyanosis: No Ecchymosis: No Brad Taylor, Brad Taylor. ([id number]) Erythema: No Pallor: No Rubor: No Temperature: No Abnormality N/A N/A Tenderness on Yes N/A N/A Palpation: Wound Preparation: Ulcer Cleansing: N/A N/A Rinsed/Irrigated with Saline, Other: surg scrub and water Topical Anesthetic Applied: Other: lidocaine 4% Procedures Performed: Debridement N/A N/A Treatment Notes Wound #1 (Right, Lateral Lower Leg) 1. Cleansed with: Clean wound with Normal Saline Cleanse wound with antibacterial soap and water 2. Anesthetic Topical Lidocaine 4% cream to wound bed prior to debridement 4. Dressing Applied: Hydrogel Other dressing (specify in notes) 5. Secondary Dressing Applied ABD Pad Dry Gauze 7. Secured with Tape Notes Ogema from toes to knee, cutimed sorbact Electronic Signature(s) Signed: 09/26/2016 10:17:45 AM By: Christin Fudge MD, FACS Entered By: Christin Fudge on 09/26/2016 10:17:45 Brad Taylor ([id number]) -------------------------------------------------------------------------------- [place] Details Patient Name: Brad Taylor, Brad Taylor. Date of Service: 09/26/2016 8:45 AM Medical Record Number: [id number] Patient Account Number: [id number] Date of Birth/Sex: [birth year] (66 y.o. Male) Treating RN: Ahmed Prima Primary Care Hideko Esselman: Jenna Luo Other Clinician: Referring Maelee Hoot: Jenna Luo Treating Vernal Hritz/Extender: Frann Rider in Treatment: 6 Active Inactive ` Orientation to the Wound Care Program Nursing Diagnoses: Knowledge deficit related to the wound healing center program Goals: Patient/caregiver will verbalize understanding of the [place] Program Date Initiated: 08/15/2016 Target Resolution Date: 09/05/2016 Goal Status: Active Interventions: Provide education on orientation to the wound center Notes: ` Venous Leg Ulcer Nursing Diagnoses: Knowledge deficit related to disease process and management Potential for venous Insuffiency (use before diagnosis confirmed) Goals: Patient will maintain optimal edema control Date Initiated: 08/15/2016 Target Resolution Date: 09/05/2016 Goal Status: Active Patient/caregiver will verbalize understanding of disease process and disease management Date Initiated: 08/15/2016 Target Resolution Date: 09/05/2016 Goal Status: Active Verify adequate tissue perfusion prior to therapeutic compression application Date Initiated: 08/15/2016 Target Resolution Date: 09/05/2016 Goal Status: Active Interventions: Assess peripheral edema status every visit. Compression as ordered Brad Taylor, Brad Taylor ([id number]) Provide education on venous insufficiency Treatment Activities: Therapeutic compression applied : 08/15/2016 Notes: ` Wound/Skin Impairment Nursing Diagnoses:  Impaired tissue integrity Knowledge deficit related to ulceration/compromised skin integrity Goals: Patient/caregiver will verbalize understanding of skin care regimen Date Initiated: 08/15/2016 Target Resolution Date: 09/05/2016 Goal Status: Active Ulcer/skin breakdown will have a volume reduction of 30% by week 4 Date Initiated: 08/15/2016 Target Resolution Date: 09/05/2016 Goal Status: Active Ulcer/skin breakdown will have a volume reduction of 50% by week 8 Date Initiated: 08/15/2016 Target Resolution Date: 09/05/2016 Goal Status:  Active Ulcer/skin breakdown will have a volume reduction of 80% by week 12 Date Initiated: 08/15/2016 Target Resolution Date: 09/05/2016 Goal Status: Active Ulcer/skin breakdown will heal within 14 weeks Date Initiated: 08/15/2016 Target Resolution Date: 09/05/2016 Goal Status: Active Interventions: Assess patient/caregiver ability to obtain necessary supplies Assess patient/caregiver ability to perform ulcer/skin care regimen upon admission and as needed Assess ulceration(s) every visit Provide education on smoking Provide education on ulcer and skin care Treatment Activities: Referred to DME Jodeci Rini for dressing supplies : 08/15/2016 Skin care regimen initiated : 08/15/2016 Topical wound management initiated : 08/15/2016 Notes: Brad Taylor, Brad Taylor ([id number]) Electronic Signature(s) Signed: 09/27/2016 4:53:33 PM By: Alric Quan Entered By: Alric Quan on 09/26/2016 08:53:29 Kott, Brad Taylor Form ([id number]) -------------------------------------------------------------------------------- Pain Assessment Details Patient Name: Brad Taylor. Date of Service: 09/26/2016 8:45 AM Medical Record Number: [id number] Patient Account Number: [id number] Date of Birth/Sex: 1951-01-26 (66 y.o. Male) Treating RN: Ahmed Prima Primary Care Askari Kinley: Jenna Luo Other Clinician: Referring Tiyona Desouza: Jenna Luo Treating Nishawn Rotan/Extender: Frann Rider in Treatment: 6 Active Problems Location of Pain Severity and Description of Pain Patient Has Paino No Site Locations With Dressing Change: No Pain Management and Medication Current Pain Management: Electronic Signature(s) Signed: 09/27/2016 4:53:33 PM By: Alric Quan Entered By: Alric Quan on 09/26/2016 08:41:13 Brad Taylor ([id number]) -------------------------------------------------------------------------------- Patient/Caregiver Education Details Patient Name: Brad Taylor. Date of Service:  09/26/2016 8:45 AM Medical Record Number: [id number] Patient Account Number: [id number] Date of Birth/Gender: Aug 18, 1950 (66 y.o. Male) Treating RN: Ahmed Prima Primary Care Physician: Jenna Luo Other Clinician: Referring Physician: Jenna Luo Treating Physician/Extender: Frann Rider in Treatment: 6 Education Assessment Education Provided To: Patient Education Topics Provided Wound/Skin Impairment: Handouts: Other: change dressing as ordered, do not get wrap wet Methods: Demonstration, Explain/Verbal Responses: State content correctly Electronic Signature(s) Signed: 09/27/2016 4:53:33 PM By: Alric Quan Entered By: Alric Quan on 09/26/2016 08:55:44 Intrieri, Brad Taylor Form ([id number]) -------------------------------------------------------------------------------- Wound Assessment Details Patient Name: Brad Pain L. Date of Service: 09/26/2016 8:45 AM Medical Record Number: [id number] Patient Account Number: [id number] Date of Birth/Sex: April 30, 1951 (66 y.o. Male) Treating RN: Ahmed Prima Primary Care Aaralynn Shepheard: Jenna Luo Other Clinician: Referring Agustine Rossitto: Jenna Luo Treating Tiffney Haughton/Extender: Frann Rider in Treatment: 6 Wound Status Wound Number: 1 Primary Diabetic Wound/Ulcer of the Lower Etiology: Extremity Wound Location: Right Lower Leg - Lateral Wound Open Wounding Event: Trauma Status: Date Acquired: 05/14/2016 Comorbid Chronic Obstructive Pulmonary Disease Weeks Of Treatment: 6 History: (COPD), Arrhythmia, Hypertension, Clustered Wound: No Myocardial Infarction, Type II Diabetes Photos Photo Uploaded By: Alric Quan on 09/26/2016 08:56:59 Wound Measurements Length: (cm) 5.9 Width: (cm) 2 Depth: (cm) 0.1 Area: (cm) 9.268 Volume: (cm) 0.927 % Reduction in Area: 36.6% % Reduction in Volume: 36.6% Epithelialization: Small (1-33%) Tunneling: No Undermining: No Wound Description Classification:  Grade 1 Wound Margin: Flat and Intact Exudate Amount: Large Exudate Type: Serosanguineous Exudate Color: red, brown Foul Odor After Cleansing: No Slough/Fibrino Yes Wound Bed Granulation Amount: Medium (34-66%) Exposed Structure Granulation Quality: Pink, Pale Fascia Exposed: No Necrotic Amount: Medium (34-66%) Fat Layer (Subcutaneous Tissue) Exposed: Yes Necrotic  Quality: Adherent Slough Tendon Exposed: No NYAN, WHAN. ([id number]) Muscle Exposed: No Joint Exposed: No Bone Exposed: No Periwound Skin Texture Texture Color No Abnormalities Noted: No No Abnormalities Noted: No Callus: No Atrophie Blanche: No Crepitus: No Cyanosis: No Excoriation: No Ecchymosis: No Induration: No Erythema: No Rash: No Hemosiderin Staining: Yes Scarring: Yes Mottled: Yes Pallor: No Moisture Rubor: No No Abnormalities Noted: No Dry / Scaly: No Temperature / Pain Maceration: Yes Temperature: No Abnormality Tenderness on Palpation: Yes Wound Preparation Ulcer Cleansing: Rinsed/Irrigated with Saline, Other: surg scrub and water, Topical Anesthetic Applied: Other: lidocaine 4%, Treatment Notes Wound #1 (Right, Lateral Lower Leg) 1. Cleansed with: Clean wound with Normal Saline Cleanse wound with antibacterial soap and water 2. Anesthetic Topical Lidocaine 4% cream to wound bed prior to debridement 4. Dressing Applied: Hydrogel Other dressing (specify in notes) 5. Secondary Dressing Applied ABD Pad Dry Gauze 7. Secured with Tape Notes Nelson from toes to knee, cutimed sorbact Electronic Signature(s) Signed: 09/27/2016 4:53:33 PM By: Alric Quan Entered By: Alric Quan on 09/26/2016 08:51:58 Brad Taylor ([id number]) -------------------------------------------------------------------------------- Vitals Details Patient Name: Brad Taylor. Date of Service: 09/26/2016 8:45 AM Medical Record Number: [id number] Patient Account Number:  [id number] Date of Birth/Sex: 19-Sep-1950 (66 y.o. Male) Treating RN: Ahmed Prima Primary Care Chanley Mcenery: Jenna Luo Other Clinician: Referring Rydell Wiegel: Jenna Luo Treating Teo Moede/Extender: Frann Rider in Treatment: 6 Vital Signs Time Taken: 08:41 Temperature (F): 97.8 Height (in): 72 Pulse (bpm): 59 Weight (lbs): 250 Respiratory Rate (breaths/min): 18 Body Mass Index (BMI): 33.9 Blood Pressure (mmHg): 127/69 Reference Range: 80 - 120 mg / dl Electronic Signature(s) Signed: 09/27/2016 4:53:33 PM By: Alric Quan Entered By: Alric Quan on 09/26/2016 08:42:49

## 2016-09-28 NOTE — Progress Notes (Addendum)
TRE, BARCELLONA ([id number]) Visit Report for 09/26/2016 Chief Complaint Document Details Patient Name: Brad Taylor, Brad Taylor. Date of Service: 09/26/2016 8:45 AM Medical Record Number: [id number] Patient Account Number: [id number] Date of Birth/Sex: 01/25/1951 (66 y.o. Male) Treating RN: Ahmed Prima Primary Care Provider: Jenna Luo Other Clinician: Referring Provider: Jenna Luo Treating Provider/Extender: Frann Rider in Treatment: 6 Information Obtained from: Patient Chief Complaint Patient presents for treatment of an open ulcer due to venous insufficiency along with diabetes mellitus, to his right lower extremity which she's had for about 3 months Electronic Signature(s) Signed: 09/26/2016 10:18:09 AM By: Christin Fudge MD, FACS Entered By: Christin Fudge on 09/26/2016 10:18:09 Brad Taylor ([id number]) -------------------------------------------------------------------------------- Debridement Details Patient Name: Brad Taylor. Date of Service: 09/26/2016 8:45 AM Medical Record Number: [id number] Patient Account Number: [id number] Date of Birth/Sex: Jun 29, 1951 (66 y.o. Male) Treating RN: Ahmed Prima Primary Care Provider: Jenna Luo Other Clinician: Referring Provider: Jenna Luo Treating Provider/Extender: Frann Rider in Treatment: 6 Debridement Performed for Wound #1 Right,Lateral Lower Leg Assessment: Performed By: Physician Christin Fudge, MD Debridement: Debridement Pre-procedure Yes - 09:12 Verification/Time Out Taken: Start Time: 09:13 Pain Control: Lidocaine 4% Topical Solution Level: Skin/Subcutaneous Tissue Total Area Debrided (Taylor x 5.9 (cm) x 2 (cm) = 11.8 (cm) W): Tissue and other Viable, Non-Viable, Exudate, Fibrin/Slough, Subcutaneous material debrided: Instrument: Curette Bleeding: Minimum Hemostasis Achieved: Pressure End Time: 09:15 Procedural Pain: 0 Post Procedural Pain: 0 Response to Treatment:  Procedure was tolerated well Post Debridement Measurements of Total Wound Length: (cm) 5.9 Width: (cm) 2 Depth: (cm) 0.1 Volume: (cm) 0.927 Character of Wound/Ulcer Post Requires Further Debridement Debridement: Severity of Tissue Post Debridement: Fat layer exposed Post Procedure Diagnosis Same as Pre-procedure Electronic Signature(s) Signed: 09/26/2016 10:18:01 AM By: Christin Fudge MD, FACS Signed: 09/27/2016 4:53:33 PM By: Alric Quan Entered By: Christin Fudge on 09/26/2016 10:18:01 Brad Taylor ([id number]) Brad Taylor, Brad Taylor ([id number]) -------------------------------------------------------------------------------- HPI Details Patient Name: Brad Taylor, Brad Taylor. Date of Service: 09/26/2016 8:45 AM Medical Record Number: [id number] Patient Account Number: [id number] Date of Birth/Sex: [birth year] (66 y.o. Male) Treating RN: Ahmed Prima Primary Care Provider: Jenna Luo Other Clinician: Referring Provider: Jenna Luo Treating Provider/Extender: Frann Rider in Treatment: 6 History of Present Illness Location: right lower extremity Quality: Patient reports experiencing a dull pain to affected area(s). Severity: Patient states wound are getting worse. Duration: Patient has had the wound for > 3 months prior to seeking treatment at the wound center Timing: Pain in wound is Intermittent (comes and goes Context: The wound occurred when the patient had a lacerated wound which was treated with sutures which gaped and had an open ulceration Modifying Factors: Other treatment(s) tried include:been having on and Unna's boots applied by his PCP in the office Associated Signs and Symptoms: Patient reports having increase swelling. HPI Description: 66 year old gentleman with a past medical history of stroke, diabetes mellitus2 with hyperglycemia, CVA, hemiplegia, ahasia, cellulitis of the left gluteal area and is a smoker and smokes a packet of cigarettes a day.  His past medical history also significant for COPD, thyroid disease and is status post cholecystectomy and status post knee surgery in October 2017 the patient had a laceration to his right lateral shin and had a deep tear which was sutured with 4-0 Ethilon sutures but these opened out a cause of his chronic venous insufficiency and severe edema and the patient was seen by his PCP and put on Bactrim. she was also advised to do dressings with Silvadene and Unna's  boots. We don't have notes from the [facility] in [place] but he says he's had a thorough workup done both arterial and venous and was told that he has venous insufficiency and was to wear compression stockings of 30-40 mm variety. We'll try and obtain these notes. He also gives a history of a lung mass which was a cancer and treated with resection but no chemotherapy or radiation. 08/29/2016 -- not yet received any of his notes from the [facility] 09/05/2016 -- in spite of every effort on the patient's part and are part we have not been able to get any reports regarding his arterial and venous workup. At this stage I'm going to request fresh arterial duplex studies and venous reflux studies. 09/12/2016 -- the patient has not yet got his arterial and venous duplex study reports and refuses to do these tests again. He says he will get the reports back in about 2 weeks' time. He also says he has had a lot of pain and this lower extremity using Hydrofera Blue and asks to change the dressing material. 09/19/2016 -- patient's wife did talk to the doctor at the [place] and they will send as the old report of arterial and venous duplex studies and if need be they will repeat it again. Rash request will be sent by [place]. 09/26/2016 -- my office has sent the requests for arterial venous duplex studies to the [facility] and they have received it and the patient is waiting back for an appointment. October 03 2016 -- Notes reviewed from the [facility] were  reviewed and in June 2016 he was scheduled to have a right upper lobe lung surgery by VATS and I presume this was done as planned. A note from June 2017 stated that he was status post right upper lobectomy with mediastinal lymphadenectomy for a T2 Ginther, Odis Taylor. ([id number]) N0 MX well-differentiated squamous cell carcinoma. After the multidisciplinary oncology conference he was recommended surveillance protocol. Electronic Signature(s) Signed: 10/03/2016 8:51:45 AM By: Christin Fudge MD, FACS Previous Signature: 10/03/2016 8:51:12 AM Version By: Christin Fudge MD, FACS Previous Signature: 09/26/2016 10:18:39 AM Version By: Christin Fudge MD, FACS Entered By: Christin Fudge on 10/03/2016 08:51:45 Brad Taylor ([id number]) -------------------------------------------------------------------------------- Physical Exam Details Patient Name: Brad Taylor, Brad Taylor. Date of Service: 09/26/2016 8:45 AM Medical Record Number: [id number] Patient Account Number: [id number] Date of Birth/Sex: [birth year] (66 y.o. Male) Treating RN: Ahmed Prima Primary Care Provider: Jenna Luo Other Clinician: Referring Provider: Jenna Luo Treating Provider/Extender: Frann Rider in Treatment: 6 Constitutional . Pulse regular. Respirations normal and unlabored. Afebrile. . Eyes Nonicteric. Reactive to light. Ears, Nose, Mouth, and Throat Lips, teeth, and gums WNL.Marland Kitchen Moist mucosa without lesions. Neck supple and nontender. No palpable supraclavicular or cervical adenopathy. Normal sized without goiter. Respiratory WNL. No retractions.. Cardiovascular Pedal Pulses WNL. No clubbing, cyanosis or edema. Lymphatic No adneopathy. No adenopathy. No adenopathy. Musculoskeletal Adexa without tenderness or enlargement.. Digits and nails w/o clubbing, cyanosis, infection, petechiae, ischemia, or inflammatory conditions.. Integumentary (Hair, Skin) No suspicious lesions. No crepitus or fluctuance. No  peri-wound warmth or erythema. No masses.Marland Kitchen Psychiatric Judgement and insight Intact.. No evidence of depression, anxiety, or agitation.. Notes the lymphedema is much better and I was able to sharply remove a lot of the subcutaneous debris with a #3 curet and minimal bleeding controlled with pressure. Electronic Signature(s) Signed: 09/26/2016 10:19:06 AM By: Christin Fudge MD, FACS Entered By: Christin Fudge on 09/26/2016 10:19:05 Brad Taylor ([id number]) -------------------------------------------------------------------------------- Physician Orders Details  Patient Name: Brad Taylor, WARES. Date of Service: 09/26/2016 8:45 AM Medical Record Number: [id number] Patient Account Number: [id number] Date of Birth/Sex: 1951/03/31 (66 y.o. Male) Treating RN: Ahmed Prima Primary Care Provider: Jenna Luo Other Clinician: Referring Provider: Jenna Luo Treating Provider/Extender: Frann Rider in Treatment: 6 Verbal / Phone Orders: Yes Clinician: Carolyne Fiscal, Debi Read Back and Verified: Yes Diagnosis Coding Wound Cleansing Wound #1 Right,Lateral Lower Leg o Cleanse wound with mild soap and water - in clinic Anesthetic Wound #1 Right,Lateral Lower Leg o Topical Lidocaine 4% cream applied to wound bed prior to debridement Primary Wound Dressing Wound #1 Right,Lateral Lower Leg o Other: - hydrogel and sorbact Secondary Dressing Wound #1 Right,Lateral Lower Leg o ABD pad o Dry Gauze o Kerlix and Coban Dressing Change Frequency Wound #1 Right,Lateral Lower Leg o Change dressing every week Follow-up Appointments Wound #1 Right,Lateral Lower Leg o Return Appointment in 1 week. Edema Control Wound #1 Right,Lateral Lower Leg o Kerlix and Coban - Right Lower Extremity - unna to anchor o Patient to wear own compression stockings o Elevate legs to the level of the heart and pump ankles as often as possible Additional Orders / Instructions Wound #1  Right,Lateral Lower Leg o Stop Smoking Kizer, Maylon Taylor. ([id number]) o Increase protein intake. o Activity as tolerated o Other: - ZINC, VIT A, VIT C, MVI INCLUDE THESE IN YOU DIET Electronic Signature(s) Signed: 09/26/2016 4:29:50 PM By: Christin Fudge MD, FACS Signed: 09/27/2016 4:53:33 PM By: Alric Quan Entered By: Alric Quan on 09/26/2016 09:15:17 Brad Taylor ([id number]) -------------------------------------------------------------------------------- Problem List Details Patient Name: Brad Taylor, Brad Taylor. Date of Service: 09/26/2016 8:45 AM Medical Record Number: [id number] Patient Account Number: [id number] Date of Birth/Sex: 1951-02-20 (66 y.o. Male) Treating RN: Ahmed Prima Primary Care Provider: Jenna Luo Other Clinician: Referring Provider: Jenna Luo Treating Provider/Extender: Frann Rider in Treatment: 6 Active Problems ICD-10 Encounter Code Description Active Date Diagnosis E11.622 Type 2 diabetes mellitus with other skin ulcer 08/15/2016 Yes L97.212 Non-pressure chronic ulcer of right calf with fat layer 08/15/2016 Yes exposed I87.2 Venous insufficiency (chronic) (peripheral) 08/15/2016 Yes F17.218 Nicotine dependence, cigarettes, with other nicotine- 08/15/2016 Yes induced disorders Inactive Problems Resolved Problems Electronic Signature(s) Signed: 09/26/2016 10:17:28 AM By: Christin Fudge MD, FACS Entered By: Christin Fudge on 09/26/2016 10:17:28 Brad Taylor ([id number]) -------------------------------------------------------------------------------- Progress Note Details Patient Name: Brad Taylor. Date of Service: 09/26/2016 8:45 AM Medical Record Number: [id number] Patient Account Number: [id number] Date of Birth/Sex: [birth year] (66 y.o. Male) Treating RN: Ahmed Prima Primary Care Provider: Jenna Luo Other Clinician: Referring Provider: Jenna Luo Treating Provider/Extender: Frann Rider  in Treatment: 6 Subjective Chief Complaint Information obtained from Patient Patient presents for treatment of an open ulcer due to venous insufficiency along with diabetes mellitus, to his right lower extremity which she's had for about 3 months History of Present Illness (HPI) The following HPI elements were documented for the patient's wound: Location: right lower extremity Quality: Patient reports experiencing a dull pain to affected area(s). Severity: Patient states wound are getting worse. Duration: Patient has had the wound for > 3 months prior to seeking treatment at the wound center Timing: Pain in wound is Intermittent (comes and goes Context: The wound occurred when the patient had a lacerated wound which was treated with sutures which gaped and had an open ulceration Modifying Factors: Other treatment(s) tried include:been having on and Unna's boots applied by his PCP in the office Associated Signs and Symptoms: Patient reports having increase swelling. 66 year old  gentleman with a past medical history of stroke, diabetes mellitus2 with hyperglycemia, CVA, hemiplegia, ahasia, cellulitis of the left gluteal area and is a smoker and smokes a packet of cigarettes a day. His past medical history also significant for COPD, thyroid disease and is status post cholecystectomy and status post knee surgery in October 2017 the patient had a laceration to his right lateral shin and had a deep tear which was sutured with 4-0 Ethilon sutures but these opened out a cause of his chronic venous insufficiency and severe edema and the patient was seen by his PCP and put on Bactrim. she was also advised to do dressings with Silvadene and Unna's boots. We don't have notes from the [facility] in [place] but he says he's had a thorough workup done both arterial and venous and was told that he has venous insufficiency and was to wear compression stockings of 30-40 mm variety. We'll try and obtain  these notes. He also gives a history of a lung mass which was a cancer and treated with resection but no chemotherapy or radiation. 08/29/2016 -- not yet received any of his notes from the [facility] 09/05/2016 -- in spite of every effort on the patient's part and are part we have not been able to get any reports regarding his arterial and venous workup. At this stage I'm going to request fresh arterial duplex studies and venous reflux studies. 09/12/2016 -- the patient has not yet got his arterial and venous duplex study reports and refuses to do these tests again. He says he will get the reports back in about 2 weeks' time. He also says he has had a Brad Taylor, Brad Taylor. ([id number]) lot of pain and this lower extremity using Hydrofera Blue and asks to change the dressing material. 09/19/2016 -- patient's wife did talk to the doctor at the [place] and they will send as the old report of arterial and venous duplex studies and if need be they will repeat it again. Rash request will be sent by [place]. 09/26/2016 -- my office has sent the requests for arterial venous duplex studies to the [facility] and they have received it and the patient is waiting back for an appointment. October 03 2016 -- Notes reviewed from the [facility] were reviewed and in June 2016 he was scheduled to have a right upper lobe lung surgery by [facility] and I presume this was done as planned. A note from June 2017 stated that he was status post right upper lobectomy with mediastinal lymphadenectomy for a T2 N0 MX well-differentiated squamous cell carcinoma. After the multidisciplinary oncology conference he was recommended surveillance protocol. Objective Constitutional Pulse regular. Respirations normal and unlabored. Afebrile. Vitals Time Taken: 8:41 AM, Height: 72 in, Weight: 250 lbs, BMI: 33.9, Temperature: 97.8 F, Pulse: 59 bpm, Respiratory Rate: 18 breaths/min, Blood Pressure: 127/69 mmHg. Eyes Nonicteric. Reactive to  light. Ears, Nose, Mouth, and Throat Lips, teeth, and gums WNL.Marland Kitchen Moist mucosa without lesions. Neck supple and nontender. No palpable supraclavicular or cervical adenopathy. Normal sized without goiter. Respiratory WNL. No retractions.. Cardiovascular Pedal Pulses WNL. No clubbing, cyanosis or edema. Lymphatic No adneopathy. No adenopathy. No adenopathy. Musculoskeletal Adexa without tenderness or enlargement.. Digits and nails w/o clubbing, cyanosis, infection, petechiae, ischemia, or inflammatory conditions.Marland Kitchen Psychiatric Judgement and insight Intact.. No evidence of depression, anxiety, or agitation.Brad Taylor, Brad Taylor ([id number]) General Notes: the lymphedema is much better and I was able to sharply remove a lot of the subcutaneous debris with a #3 curet  and minimal bleeding controlled with pressure. Integumentary (Hair, Skin) No suspicious lesions. No crepitus or fluctuance. No peri-wound warmth or erythema. No masses.. Wound #1 status is Open. Original cause of wound was Trauma. The wound is located on the Right,Lateral Lower Leg. The wound measures 5.9cm length x 2cm width x 0.1cm depth; 9.268cm^2 area and 0.927cm^3 volume. There is Fat Layer (Subcutaneous Tissue) Exposed exposed. There is no tunneling or undermining noted. There is a large amount of serosanguineous drainage noted. The wound margin is flat and intact. There is medium (34-66%) pink, pale granulation within the wound bed. There is a medium (34- 66%) amount of necrotic tissue within the wound bed including Adherent Slough. The periwound skin appearance exhibited: Scarring, Maceration, Hemosiderin Staining, Mottled. The periwound skin appearance did not exhibit: Callus, Crepitus, Excoriation, Induration, Rash, Dry/Scaly, Atrophie Blanche, Cyanosis, Ecchymosis, Pallor, Rubor, Erythema. Periwound temperature was noted as No Abnormality. The periwound has tenderness on palpation. Assessment Active  Problems ICD-10 E11.622 - Type 2 diabetes mellitus with other skin ulcer L97.212 - Non-pressure chronic ulcer of right calf with fat layer exposed I87.2 - Venous insufficiency (chronic) (peripheral) F17.218 - Nicotine dependence, cigarettes, with other nicotine-induced disorders Procedures Wound #1 Wound #1 is a Diabetic Wound/Ulcer of the Lower Extremity located on the Right,Lateral Lower Leg . There was a Skin/Subcutaneous Tissue Debridement [id number]) debridement with total area of 11.8 sq cm performed by Christin Fudge, MD. with the following instrument(s): Curette to remove Viable and Non-Viable tissue/material including Exudate, Fibrin/Slough, and Subcutaneous after achieving pain control using Lidocaine 4% Topical Solution. A time out was conducted at 09:12, prior to the start of the procedure. A Minimum amount of bleeding was controlled with Pressure. The procedure was tolerated well with a pain level of 0 throughout and a pain level of 0 following the procedure. Post Debridement Measurements: 5.9cm length x 2cm width x 0.1cm depth; 0.927cm^3 volume. Character of Wound/Ulcer Post Debridement requires further debridement. Severity of Tissue Post Debridement is: Fat layer exposed. Post procedure Diagnosis Wound #1: Same as Pre-Procedure Brad Taylor, Brad Taylor ([id number]) Plan Wound Cleansing: Wound #1 Right,Lateral Lower Leg: Cleanse wound with mild soap and water - in clinic Anesthetic: Wound #1 Right,Lateral Lower Leg: Topical Lidocaine 4% cream applied to wound bed prior to debridement Primary Wound Dressing: Wound #1 Right,Lateral Lower Leg: Other: - hydrogel and sorbact Secondary Dressing: Wound #1 Right,Lateral Lower Leg: ABD pad Dry Gauze Kerlix and Coban Dressing Change Frequency: Wound #1 Right,Lateral Lower Leg: Change dressing every week Follow-up Appointments: Wound #1 Right,Lateral Lower Leg: Return Appointment in 1 week. Edema Control: Wound #1 Right,Lateral  Lower Leg: Kerlix and Coban - Right Lower Extremity - unna to anchor Patient to wear own compression stockings Elevate legs to the level of the heart and pump ankles as often as possible Additional Orders / Instructions: Wound #1 Right,Lateral Lower Leg: Stop Smoking Increase protein intake. Activity as tolerated Other: - ZINC, VIT A, VIT C, MVI INCLUDE THESE IN YOU DIET After review I have recommended: 1. Hydrogel with Sorbact with a 2 layer compression with Kerlix and Coban. he tolerated this well last week Brad Taylor, Brad Taylor. ([id number]) 2. Good control of his diabetes mellitus and I have asked him to see his PCP for further care 3. in discussed with them the need to completely give up smoking and I discussed the risk benefits and alternatives especially all the possibilities of improved wound healing 4. elevation and exercise 5. Adequate protein, vitamin A, vitamin C and zinc 6. Regular  visits to the wound center 7. fresh requests for arterial and venous duplex studies are again given. he and his wife have read all questions answered and will be compliant Electronic Signature(s) Signed: 10/03/2016 4:20:36 PM By: Christin Fudge MD, FACS Previous Signature: 09/26/2016 10:19:55 AM Version By: Christin Fudge MD, FACS Entered By: Christin Fudge on 10/03/2016 16:20:36 Brad Taylor ([id number]) -------------------------------------------------------------------------------- SuperBill Details Patient Name: TAEO, SHELLHAMMER Taylor. Date of Service: 09/26/2016 Medical Record Number: [id number] Patient Account Number: [id number] Date of Birth/Sex: 05-Oct-1950 (66 y.o. Male) Treating RN: Ahmed Prima Primary Care Provider: Jenna Luo Other Clinician: Referring Provider: Jenna Luo Treating Provider/Extender: Christin Fudge Service Line: Outpatient Weeks in Treatment: 6 Diagnosis Coding ICD-10 Codes Code Description E11.622 Type 2 diabetes mellitus with other skin ulcer L97.212  Non-pressure chronic ulcer of right calf with fat layer exposed I87.2 Venous insufficiency (chronic) (peripheral) F17.218 Nicotine dependence, cigarettes, with other nicotine-induced disorders Facility Procedures CPT4 Code: [id number] Description: [id number] - DEB SUBQ TISSUE 20 SQ CM/< ICD-10 Description Diagnosis E11.622 Type 2 diabetes mellitus with other skin ulcer L97.212 Non-pressure chronic ulcer of right calf with fat I87.2 Venous insufficiency (chronic) (peripheral) Modifier: layer exposed Quantity: 1 Physician Procedures CPT4 Code: [id number] Description: 11042 - WC PHYS SUBQ TISS 20 SQ CM ICD-10 Description Diagnosis E11.622 Type 2 diabetes mellitus with other skin ulcer L97.212 Non-pressure chronic ulcer of right calf with fat I87.2 Venous insufficiency (chronic) (peripheral) Modifier: layer exposed Quantity: 1 Electronic Signature(s) Signed: 09/26/2016 10:20:03 AM By: Christin Fudge MD, FACS Entered By: Christin Fudge on 09/26/2016 10:20:02

## 2016-10-03 ENCOUNTER — Encounter: Payer: Medicare HMO | Attending: Surgery | Admitting: Surgery

## 2016-10-03 DIAGNOSIS — Z79899 Other long term (current) drug therapy: Secondary | ICD-10-CM | POA: Insufficient documentation

## 2016-10-03 DIAGNOSIS — J449 Chronic obstructive pulmonary disease, unspecified: Secondary | ICD-10-CM | POA: Insufficient documentation

## 2016-10-03 DIAGNOSIS — E039 Hypothyroidism, unspecified: Secondary | ICD-10-CM | POA: Insufficient documentation

## 2016-10-03 DIAGNOSIS — I872 Venous insufficiency (chronic) (peripheral): Secondary | ICD-10-CM | POA: Diagnosis not present

## 2016-10-03 DIAGNOSIS — I252 Old myocardial infarction: Secondary | ICD-10-CM | POA: Diagnosis not present

## 2016-10-03 DIAGNOSIS — Z8673 Personal history of transient ischemic attack (TIA), and cerebral infarction without residual deficits: Secondary | ICD-10-CM | POA: Diagnosis not present

## 2016-10-03 DIAGNOSIS — I10 Essential (primary) hypertension: Secondary | ICD-10-CM | POA: Insufficient documentation

## 2016-10-03 DIAGNOSIS — F17218 Nicotine dependence, cigarettes, with other nicotine-induced disorders: Secondary | ICD-10-CM | POA: Insufficient documentation

## 2016-10-03 DIAGNOSIS — L97212 Non-pressure chronic ulcer of right calf with fat layer exposed: Secondary | ICD-10-CM | POA: Diagnosis not present

## 2016-10-03 DIAGNOSIS — E11622 Type 2 diabetes mellitus with other skin ulcer: Secondary | ICD-10-CM | POA: Insufficient documentation

## 2016-10-03 DIAGNOSIS — Z794 Long term (current) use of insulin: Secondary | ICD-10-CM | POA: Diagnosis not present

## 2016-10-03 DIAGNOSIS — E1169 Type 2 diabetes mellitus with other specified complication: Secondary | ICD-10-CM | POA: Diagnosis not present

## 2016-10-03 DIAGNOSIS — L97919 Non-pressure chronic ulcer of unspecified part of right lower leg with unspecified severity: Secondary | ICD-10-CM | POA: Diagnosis not present

## 2016-10-04 NOTE — Progress Notes (Addendum)
MATAIO, DILELLO ([id number]) Visit Report for 10/03/2016 Chief Complaint Document Details Patient Name: Brad Taylor, Brad Taylor. Date of Service: 10/03/2016 8:45 AM Medical Record Number: [id number] Patient Account Number: [id number] Date of Birth/Sex: 1951/01/05 (66 y.o. Male) Treating RN: Ahmed Prima Primary Care Provider: Jenna Luo Other Clinician: Referring Provider: Jenna Luo Treating Provider/Extender: Frann Rider in Treatment: 7 Information Obtained from: Patient Chief Complaint Patient presents for treatment of an open ulcer due to venous insufficiency along with diabetes mellitus, to his right lower extremity which she's had for about 3 months Electronic Signature(s) Signed: 10/03/2016 10:29:20 AM By: Christin Fudge MD, FACS Previous Signature: 10/03/2016 10:08:56 AM Version By: Christin Fudge MD, FACS Entered By: Christin Fudge on 10/03/2016 10:29:20 Brad Taylor ([id number]) -------------------------------------------------------------------------------- HPI Details Patient Name: Brad Taylor, Brad L. Date of Service: 10/03/2016 8:45 AM Medical Record Number: [id number] Patient Account Number: [id number] Date of Birth/Sex: [birth year] (66 y.o. Male) Treating RN: Ahmed Prima Primary Care Provider: Jenna Luo Other Clinician: Referring Provider: Jenna Luo Treating Provider/Extender: Frann Rider in Treatment: 7 History of Present Illness Location: right lower extremity Quality: Patient reports experiencing a dull pain to affected area(s). Severity: Patient states wound are getting worse. Duration: Patient has had the wound for > 3 months prior to seeking treatment at the wound center Timing: Pain in wound is Intermittent (comes and goes Context: The wound occurred when the patient had a lacerated wound which was treated with sutures which gaped and had an open ulceration Modifying Factors: Other treatment(s) tried include:been having on and  Unna's boots applied by his PCP in the office Associated Signs and Symptoms: Patient reports having increase swelling. HPI Description: 66 year old gentleman with a past medical history of stroke, diabetes mellitus2 with hyperglycemia, CVA, hemiplegia, ahasia, cellulitis of the left gluteal area and is a smoker and smokes a packet of cigarettes a day. His past medical history also significant for COPD, thyroid disease and is status post cholecystectomy and status post knee surgery in October 2017 the patient had a laceration to his right lateral shin and had a deep tear which was sutured with 4-0 Ethilon sutures but these opened out because of his chronic venous insufficiency and severe edema and the patient was seen by his PCP and put on Bactrim. He was also advised to do dressings with Silvadene and Unna's boots. We don't have notes from the [facility] in [place] but he says he's had a thorough workup done both arterial and venous and was told that he has venous insufficiency and was to wear compression stockings of 30-40 mm variety. We'll try and obtain these notes. He also gives a history of a lung mass which was a cancer and treated with resection but no chemotherapy or radiation. 08/29/2016 -- not yet received any of his notes from the [facility] 09/05/2016 -- in spite of every effort on the patient's part and are part we have not been able to get any reports regarding his arterial and venous workup. At this stage I'm going to request fresh arterial duplex studies and venous reflux studies. 09/12/2016 -- the patient has not yet got his arterial and venous duplex study reports and refuses to do these tests again. He says he will get the reports back in about 2 weeks' time. He also says he has had a lot of pain and this lower extremity using Hydrofera Blue and asks to change the dressing material. 09/19/2016 -- patient's wife did talk to the doctor at the [place] and they  will send as the old  report of arterial and venous duplex studies and if need be they will repeat it again. Rash request will be sent by [place]. 09/26/2016 -- my office has sent the requests for arterial venous duplex studies to the [facility] and they have received it and the patient is waiting back for an appointment. October 03 2016 -- Notes reviewed from the [facility] were reviewed and in June 2016 he was scheduled to have a right upper lobe lung surgery by [facility] and I presume this was done as planned. A note from June 2017 stated that he was status post right upper lobectomy with mediastinal lymphadenectomy for a T2 Picco, Mayford L. ([id number]) N0 MX well-differentiated squamous cell carcinoma. After the multidisciplinary oncology conference he was recommended surveillance protocol. We also received some other notes which the patient has brought in today form November 2013, he had normal ABIs on 06/11/2012. Right was 0.9 to left was 1.12. Toe brachial index was 0.78 on the right and left was 0.67. He had a diagnosis of venous insufficiency and at that stage Unnas boots were applied. There are no recent notes from either arterial or venous duplex studies done since then. In discussing the previous workup and discussing the fact that he required a arterial duplex and a venous reflux study to be done, the patient suddenly started showing signs of being aggressive and rude and was being very aggressive in his conversation about what he wanted to and what he did not want to do. At this stage I had to firmly tell him to set up boundaries and not talk rudedly to either my staff or me and to not to threaten [place] with physical abuse. He calmed down for a while and agreed to get his workup done. Electronic Signature(s) Signed: 10/03/2016 10:34:40 AM By: Christin Fudge MD, FACS Previous Signature: 10/03/2016 9:31:23 AM Version By: Christin Fudge MD, FACS Entered By: Christin Fudge on 10/03/2016 10:34:40 Brad Taylor  ([id number]) -------------------------------------------------------------------------------- Physical Exam Details Patient Name: Brad Taylor, Brad L. Date of Service: 10/03/2016 8:45 AM Medical Record Number: [id number] Patient Account Number: [id number] Date of Birth/Sex: March 26, 1951 (66 y.o. Male) Treating RN: Ahmed Prima Primary Care Provider: Jenna Luo Other Clinician: Referring Provider: Jenna Luo Treating Provider/Extender: Frann Rider in Treatment: 7 Constitutional . Pulse regular. Respirations normal and unlabored. Afebrile. . Eyes Nonicteric. Reactive to light. Ears, Nose, Mouth, and Throat Lips, teeth, and gums WNL.Marland Kitchen Moist mucosa without lesions. Neck supple and nontender. No palpable supraclavicular or cervical adenopathy. Normal sized without goiter. Respiratory WNL. No retractions.. Breath sounds WNL, No rubs, rales, rhonchi, or wheeze.. Cardiovascular Heart rhythm and rate regular, no murmur or gallop.. Pedal Pulses WNL. No clubbing, cyanosis or edema. Chest Breasts symmetical and no nipple discharge.. Breast tissue WNL, no masses, lumps, or tenderness.. Lymphatic No adneopathy. No adenopathy. No adenopathy. Musculoskeletal Adexa without tenderness or enlargement.. Digits and nails w/o clubbing, cyanosis, infection, petechiae, ischemia, or inflammatory conditions.. Integumentary (Hair, Skin) No suspicious lesions. No crepitus or fluctuance. No peri-wound warmth or erythema. No masses.Marland Kitchen Psychiatric Judgement and insight Intact.. No evidence of depression, anxiety, or agitation.. Notes continues to have lymphedema and there is some subcutaneous debris, but no debridement was done today as the patient was in a very aggressive and rude mood and seemed deranged. Electronic Signature(s) Signed: 10/03/2016 10:35:35 AM By: Christin Fudge MD, FACS Entered By: Christin Fudge on 10/03/2016 10:35:35 Brad Taylor  ([id number]) -------------------------------------------------------------------------------- Physician Orders Details Patient Name: Brad Taylor, Brad  L. Date of Service: 10/03/2016 8:45 AM Medical Record Number: [id number] Patient Account Number: [id number] Date of Birth/Sex: Sep 03, 1950 (66 y.o. Male) Treating RN: Ahmed Prima Primary Care Provider: Jenna Luo Other Clinician: Referring Provider: Jenna Luo Treating Provider/Extender: Frann Rider in Treatment: 7 Verbal / Phone Orders: Yes Clinician: Carolyne Fiscal, Debi Read Back and Verified: Yes Diagnosis Coding Wound Cleansing Wound #1 Right,Lateral Lower Leg o Cleanse wound with mild soap and water - in clinic Anesthetic Wound #1 Right,Lateral Lower Leg o Topical Lidocaine 4% cream applied to wound bed prior to debridement Primary Wound Dressing Wound #1 Right,Lateral Lower Leg o Other: - hydrogel and sorbact Secondary Dressing Wound #1 Right,Lateral Lower Leg o Boardered Foam Dressing Dressing Change Frequency Wound #1 Right,Lateral Lower Leg o Other: - every three days Follow-up Appointments Wound #1 Right,Lateral Lower Leg o Return Appointment in 1 week. Edema Control Wound #1 Right,Lateral Lower Leg o Patient to wear own compression stockings o Elevate legs to the level of the heart and pump ankles as often as possible Additional Orders / Instructions Wound #1 Right,Lateral Lower Leg o Stop Smoking o Increase protein intake. o Activity as tolerated o Other: - ZINC, VIT A, VIT C, MVI INCLUDE THESE IN YOU DIET Brad Taylor, Brad Taylor ([id number]) Services and Therapies o Arterial Studies- Bilateral - [place] [facility] o Venous Studies -Bilateral - [place] [facility] Electronic Signature(s) Signed: 10/03/2016 4:14:08 PM By: Christin Fudge MD, FACS Signed: 10/03/2016 4:36:14 PM By: Alric Quan Entered By: Alric Quan on 10/03/2016 11:18:01 Brad Taylor  ([id number]) -------------------------------------------------------------------------------- Problem List Details Patient Name: Brad Taylor, YOOS. Date of Service: 10/03/2016 8:45 AM Medical Record Number: [id number] Patient Account Number: [id number] Date of Birth/Sex: April 29, 1951 (66 y.o. Male) Treating RN: Ahmed Prima Primary Care Provider: Jenna Luo Other Clinician: Referring Provider: Jenna Luo Treating Provider/Extender: Frann Rider in Treatment: 7 Active Problems ICD-10 Encounter Code Description Active Date Diagnosis E11.622 Type 2 diabetes mellitus with other skin ulcer 08/15/2016 Yes L97.212 Non-pressure chronic ulcer of right calf with fat layer 08/15/2016 Yes exposed I87.2 Venous insufficiency (chronic) (peripheral) 08/15/2016 Yes F17.218 Nicotine dependence, cigarettes, with other nicotine- 08/15/2016 Yes induced disorders Inactive Problems Resolved Problems Electronic Signature(s) Signed: 10/03/2016 10:29:05 AM By: Christin Fudge MD, FACS Previous Signature: 10/03/2016 10:07:14 AM Version By: Christin Fudge MD, FACS Entered By: Christin Fudge on 10/03/2016 10:29:05 Brad Taylor ([id number]) -------------------------------------------------------------------------------- Progress Note Details Patient Name: Brad Taylor. Date of Service: 10/03/2016 8:45 AM Medical Record Number: [id number] Patient Account Number: [id number] Date of Birth/Sex: October 02, 1950 (66 y.o. Male) Treating RN: Ahmed Prima Primary Care Provider: Jenna Luo Other Clinician: Referring Provider: Jenna Luo Treating Provider/Extender: Frann Rider in Treatment: 7 Subjective Chief Complaint Information obtained from Patient Patient presents for treatment of an open ulcer due to venous insufficiency along with diabetes mellitus, to his right lower extremity which she's had for about 3 months History of Present Illness (HPI) The following HPI elements were  documented for the patient's wound: Location: right lower extremity Quality: Patient reports experiencing a dull pain to affected area(s). Severity: Patient states wound are getting worse. Duration: Patient has had the wound for > 3 months prior to seeking treatment at the wound center Timing: Pain in wound is Intermittent (comes and goes Context: The wound occurred when the patient had a lacerated wound which was treated with sutures which gaped and had an open ulceration Modifying Factors: Other treatment(s) tried include:been having on and Unna's boots applied by his PCP in the office Associated Signs and  Symptoms: Patient reports having increase swelling. 66 year old gentleman with a past medical history of stroke, diabetes mellitus2 with hyperglycemia, CVA, hemiplegia, ahasia, cellulitis of the left gluteal area and is a smoker and smokes a packet of cigarettes a day. His past medical history also significant for COPD, thyroid disease and is status post cholecystectomy and status post knee surgery in October 2017 the patient had a laceration to his right lateral shin and had a deep tear which was sutured with 4-0 Ethilon sutures but these opened out because of his chronic venous insufficiency and severe edema and the patient was seen by his PCP and put on Bactrim. He was also advised to do dressings with Silvadene and Unna's boots. We don't have notes from the [facility] in [place] but he says he's had a thorough workup done both arterial and venous and was told that he has venous insufficiency and was to wear compression stockings of 30-40 mm variety. We'll try and obtain these notes. He also gives a history of a lung mass which was a cancer and treated with resection but no chemotherapy or radiation. 08/29/2016 -- not yet received any of his notes from the [facility] 09/05/2016 -- in spite of every effort on the patient's part and are part we have not been able to get any reports  regarding his arterial and venous workup. At this stage I'm going to request fresh arterial duplex studies and venous reflux studies. 09/12/2016 -- the patient has not yet got his arterial and venous duplex study reports and refuses to do these tests again. He says he will get the reports back in about 2 weeks' time. He also says he has had a Brad Taylor, SCHERGER. ([id number]) lot of pain and this lower extremity using Hydrofera Blue and asks to change the dressing material. 09/19/2016 -- patient's wife did talk to the doctor at the [place] and they will send as the old report of arterial and venous duplex studies and if need be they will repeat it again. Rash request will be sent by [place]. 09/26/2016 -- my office has sent the requests for arterial venous duplex studies to the [facility] and they have received it and the patient is waiting back for an appointment. October 03 2016 -- Notes reviewed from the [facility] were reviewed and in June 2016 he was scheduled to have a right upper lobe lung surgery by [facility] and I presume this was done as planned. A note from June 2017 stated that he was status post right upper lobectomy with mediastinal lymphadenectomy for a T2 N0 MX well-differentiated squamous cell carcinoma. After the multidisciplinary oncology conference he was recommended surveillance protocol. We also received some other notes which the patient has brought in today form November 2013, he had normal ABIs on 06/11/2012. Right was 0.9 to left was 1.12. Toe brachial index was 0.78 on the right and left was 0.67. He had a diagnosis of venous insufficiency and at that stage Unnas boots were applied. There are no recent notes from either arterial or venous duplex studies done since then. In discussing the previous workup and discussing the fact that he required a arterial duplex and a venous reflux study to be done, the patient suddenly started showing signs of being aggressive and rude and was being very  aggressive in his conversation about what he wanted to and what he did not want to do. At this stage I had to firmly tell him to set up boundaries and not  talk rudedly to either my staff or me and to not to threaten [place] with physical abuse. He calmed down for a while and agreed to get his workup done. Objective Constitutional Pulse regular. Respirations normal and unlabored. Afebrile. Vitals Time Taken: 8:44 AM, Height: 72 in, Weight: 250 lbs, BMI: 33.9, Temperature: 98.1 F, Pulse: 53 bpm, Respiratory Rate: 18 breaths/min, Blood Pressure: 137/66 mmHg. Eyes Nonicteric. Reactive to light. Ears, Nose, Mouth, and Throat Lips, teeth, and gums WNL.Marland Kitchen Moist mucosa without lesions. Neck supple and nontender. No palpable supraclavicular or cervical adenopathy. Normal sized without goiter. Respiratory WNL. No retractions.. Breath sounds WNL, No rubs, rales, rhonchi, or wheeze.. Cardiovascular Heart rhythm and rate regular, no murmur or gallop.. Pedal Pulses WNL. No clubbing, cyanosis or edema. Brad Taylor, Brad Taylor ([id number]) Chest Breasts symmetical and no nipple discharge.. Breast tissue WNL, no masses, lumps, or tenderness.. Lymphatic No adneopathy. No adenopathy. No adenopathy. Musculoskeletal Adexa without tenderness or enlargement.. Digits and nails w/o clubbing, cyanosis, infection, petechiae, ischemia, or inflammatory conditions.Marland Kitchen Psychiatric Judgement and insight Intact.. No evidence of depression, anxiety, or agitation.. General Notes: continues to have lymphedema and there is some subcutaneous debris, but no debridement was done today as the patient was in a very aggressive and rude mood and seemed deranged. Integumentary (Hair, Skin) No suspicious lesions. No crepitus or fluctuance. No peri-wound warmth or erythema. No masses.. Wound #1 status is Open. Original cause of wound was Trauma. The wound is located on the Right,Lateral Lower Leg. The wound measures 5.6cm length x 2cm width  x 0.2cm depth; 8.796cm^2 area and 1.759cm^3 volume. There is Fat Layer (Subcutaneous Tissue) Exposed exposed. There is no tunneling or undermining noted. There is a large amount of serosanguineous drainage noted. The wound margin is flat and intact. There is medium (34-66%) pink, pale granulation within the wound bed. There is a medium (34- 66%) amount of necrotic tissue within the wound bed including Adherent Slough. The periwound skin appearance exhibited: Scarring, Maceration, Hemosiderin Staining, Mottled. The periwound skin appearance did not exhibit: Callus, Crepitus, Excoriation, Induration, Rash, Dry/Scaly, Atrophie Blanche, Cyanosis, Ecchymosis, Pallor, Rubor, Erythema. Periwound temperature was noted as No Abnormality. The periwound has tenderness on palpation. Assessment Active Problems ICD-10 E11.622 - Type 2 diabetes mellitus with other skin ulcer L97.212 - Non-pressure chronic ulcer of right calf with fat layer exposed I87.2 - Venous insufficiency (chronic) (peripheral) F17.218 - Nicotine dependence, cigarettes, with other nicotine-induced disorders JEUDY, TUONG. ([id number]) Plan Wound Cleansing: Wound #1 Right,Lateral Lower Leg: Cleanse wound with mild soap and water - in clinic Anesthetic: Wound #1 Right,Lateral Lower Leg: Topical Lidocaine 4% cream applied to wound bed prior to debridement Primary Wound Dressing: Wound #1 Right,Lateral Lower Leg: Other: - hydrogel and sorbact Secondary Dressing: Wound #1 Right,Lateral Lower Leg: Boardered Foam Dressing Dressing Change Frequency: Wound #1 Right,Lateral Lower Leg: Other: - every three days Follow-up Appointments: Wound #1 Right,Lateral Lower Leg: Return Appointment in 1 week. Edema Control: Wound #1 Right,Lateral Lower Leg: Patient to wear own compression stockings Elevate legs to the level of the heart and pump ankles as often as possible Additional Orders / Instructions: Wound #1 Right,Lateral Lower  Leg: Stop Smoking Increase protein intake. Activity as tolerated Other: - ZINC, VIT A, VIT C, MVI INCLUDE THESE IN YOU DIET Services and Therapies ordered were: Arterial Studies- Bilateral - [place] [facility], Venous Studies -Bilateral - [place] [facility] Kindly note my documentation regarding the patient's behavioral disturbance today. After review I have recommended: 1. Hydrogel with Sorbact with a 2  layer compression with Kerlix and Coban. he tolerated this well last week 2. Good control of his diabetes mellitus and I have asked him to see his PCP for further care 3. elevation and exercise 4. Adequate protein, vitamin A, vitamin C and zinc 5. fresh requests for arterial and venous duplex studies are again given. he would like to get this done in [place]. ELVIR, WITCZAK ([id number]) note is made that I have firmly told the patient to talk to my staff and me with respect and if he wishes to continue treatment here he will have to comply. Electronic Signature(s) Signed: 10/03/2016 4:21:05 PM By: Christin Fudge MD, FACS Previous Signature: 10/03/2016 10:38:37 AM Version By: Christin Fudge MD, FACS Entered By: Christin Fudge on 10/03/2016 16:21:04 Brad Taylor ([id number]) -------------------------------------------------------------------------------- SuperBill Details Patient Name: TRELYN, CIABURRI. Date of Service: 10/03/2016 Medical Record Number: [id number] Patient Account Number: [id number] Date of Birth/Sex: 01/08/1951 (66 y.o. Male) Treating RN: Carolyne Fiscal, Debi Primary Care Provider: Jenna Luo Other Clinician: Referring Provider: Jenna Luo Treating Provider/Extender: Christin Fudge Service Line: Outpatient Weeks in Treatment: 7 Diagnosis Coding ICD-10 Codes Code Description E11.622 Type 2 diabetes mellitus with other skin ulcer L97.212 Non-pressure chronic ulcer of right calf with fat layer exposed I87.2 Venous insufficiency (chronic) (peripheral) F17.218 Nicotine  dependence, cigarettes, with other nicotine-induced disorders Facility Procedures CPT4 Code: [id number] Description: 99213 - WOUND CARE VISIT-LEV 3 EST PT Modifier: Quantity: 1 Physician Procedures CPT4 Code Description: [id number] - WC PHYS LEVEL 3 - EST PT ICD-10 Description Diagnosis E11.622 Type 2 diabetes mellitus with other skin ulcer L97.212 Non-pressure chronic ulcer of right calf with fat l I87.2 Venous insufficiency (chronic)  (peripheral) F17.218 Nicotine dependence, cigarettes, with other nicotin Modifier: ayer exposed e-induced di Quantity: 1 sorders Electronic Signature(s) Signed: 10/03/2016 4:14:08 PM By: Christin Fudge MD, FACS Signed: 10/03/2016 4:36:14 PM By: Alric Quan Previous Signature: 10/03/2016 10:38:50 AM Version By: Christin Fudge MD, FACS Entered By: Alric Quan on 10/03/2016 11:39:27

## 2016-10-04 NOTE — Progress Notes (Signed)
EILAM, SPIEGEL ([id number]) Visit Report for 10/03/2016 Arrival Information Details Patient Name: CLARKSON, CZAPLICKI. Date of Service: 10/03/2016 8:45 AM Medical Record Number: [id number] Patient Account Number: [id number] Date of Birth/Sex: 11/05/1950 (66 y.o. Male) Treating RN: Ahmed Prima Primary Care Tametria Aho: Jenna Luo Other Clinician: Referring Avin Gibbons: Jenna Luo Treating Darrold Bezek/Extender: Frann Rider in Treatment: 7 Visit Information History Since Last Visit All ordered tests and consults were completed: No Patient Arrived: Ambulatory Added or deleted any medications: No Arrival Time: 08:43 Any new allergies or adverse reactions: No Accompanied By: wife Had a fall or experienced change in No Transfer Assistance: None activities of daily living that may affect Patient Identification Verified: Yes risk of falls: Secondary Verification Yes Signs or symptoms of abuse/neglect since last No Process Completed: visito Patient Requires No Hospitalized since last visit: No Transmission-Based Has Dressing in Place as Prescribed: Yes Precautions: Has Compression in Place as Prescribed: Yes Patient Has Alerts: Yes Pain Present Now: No Patient Alerts: Hgb A1c 8.4 ABI:Non- Compressible >220 Electronic Signature(s) Signed: 10/03/2016 4:36:14 PM By: Alric Quan Entered By: Alric Quan on 10/03/2016 08:43:53 Holland, Eric Form ([id number]) -------------------------------------------------------------------------------- Clinic Level of Care Assessment Details Patient Name: [name]. Date of Service: 10/03/2016 8:45 AM Medical Record Number: [id number] Patient Account Number: [id number] Date of Birth/Sex: February 07, 1951 (66 y.o. Male) Treating RN: Ahmed Prima Primary Care Marili Vader: Jenna Luo Other Clinician: Referring Dujuan Stankowski: Jenna Luo Treating Zeb Rawl/Extender: Frann Rider in Treatment: 7 Clinic Level of Care Assessment  Items TOOL 4 Quantity Score X - Use when only an EandM is performed on FOLLOW-UP visit 1 0 ASSESSMENTS - Nursing Assessment / Reassessment X - Reassessment of Co-morbidities (includes updates in patient status) 1 10 X - Reassessment of Adherence to Treatment Plan 1 5 ASSESSMENTS - Wound and Skin Assessment / Reassessment X - Simple Wound Assessment / Reassessment - one wound 1 5 []  - Complex Wound Assessment / Reassessment - multiple wounds 0 []  - Dermatologic / Skin Assessment (not related to wound area) 0 ASSESSMENTS - Focused Assessment []  - Circumferential Edema Measurements - multi extremities 0 []  - Nutritional Assessment / Counseling / Intervention 0 []  - Lower Extremity Assessment (monofilament, tuning fork, pulses) 0 []  - Peripheral Arterial Disease Assessment (using hand held doppler) 0 ASSESSMENTS - Ostomy and/or Continence Assessment and Care []  - Incontinence Assessment and Management 0 []  - Ostomy Care Assessment and Management (repouching, etc.) 0 PROCESS - Coordination of Care []  - Simple Patient / Family Education for ongoing care 0 X - Complex (extensive) Patient / Family Education for ongoing care 1 20 X - Staff obtains Programmer, systems, Records, Test Results / Process Orders 1 10 []  - Staff telephones HHA, Nursing Homes / Clarify orders / etc 0 []  - Routine Transfer to another Facility (non-emergent condition) 0 NACARI, POINT ([id number]) []  - Routine Hospital Admission (non-emergent condition) 0 []  - New Admissions / Biomedical engineer / Ordering NPWT, Apligraf, etc. 0 []  - Emergency Hospital Admission (emergent condition) 0 X - Simple Discharge Coordination 1 10 []  - Complex (extensive) Discharge Coordination 0 PROCESS - Special Needs []  - Pediatric / Minor Patient Management 0 []  - Isolation Patient Management 0 []  - Hearing / Language / Visual special needs 0 []  - Assessment of Community assistance (transportation, D/C planning, etc.) 0 []  - Additional  assistance / Altered mentation 0 []  - Support Surface(s) Assessment (bed, cushion, seat, etc.) 0 INTERVENTIONS - Wound Cleansing / Measurement []  - Simple Wound Cleansing - one wound 0  X - Complex Wound Cleansing - multiple wounds 1 5 X - Wound Imaging (photographs - any number of wounds) 1 5 []  - Wound Tracing (instead of photographs) 0 X - Simple Wound Measurement - one wound 1 5 []  - Complex Wound Measurement - multiple wounds 0 INTERVENTIONS - Wound Dressings X - Small Wound Dressing one or multiple wounds 1 10 []  - Medium Wound Dressing one or multiple wounds 0 []  - Large Wound Dressing one or multiple wounds 0 X - Application of Medications - topical 1 5 []  - Application of Medications - injection 0 INTERVENTIONS - Miscellaneous []  - External ear exam 0 Calef, Riot L. ([id number]) []  - Specimen Collection (cultures, biopsies, blood, body fluids, etc.) 0 []  - Specimen(s) / Culture(s) sent or taken to Lab for analysis 0 []  - Patient Transfer (multiple staff / Harrel Lemon Lift / Similar devices) 0 []  - Simple Staple / Suture removal (25 or less) 0 []  - Complex Staple / Suture removal (26 or more) 0 []  - Hypo / Hyperglycemic Management (close monitor of Blood Glucose) 0 []  - Ankle / Brachial Index (ABI) - do not check if billed separately 0 X - Vital Signs 1 5 Has the patient been seen at the hospital within the last three years: Yes Total Score: 95 Level Of Care: New/Established - Level 3 Electronic Signature(s) Signed: 10/03/2016 4:36:14 PM By: Alric Quan Entered By: Alric Quan on 10/03/2016 11:39:19 [name] ([id number]) -------------------------------------------------------------------------------- Encounter Discharge Information Details Patient Name: [name]. Date of Service: 10/03/2016 8:45 AM Medical Record Number: [id number] Patient Account Number: [id number] Date of Birth/Sex: July 29, 1951 (66 y.o. Male) Treating RN: Ahmed Prima Primary Care  Yanna Leaks: Jenna Luo Other Clinician: Referring Daniah Zaldivar: Jenna Luo Treating Sherrina Zaugg/Extender: Frann Rider in Treatment: 7 Encounter Discharge Information Items Discharge Pain Level: 0 Discharge Condition: Stable Ambulatory Status: Ambulatory Discharge Destination: Home Transportation: Private Auto Accompanied By: wife Schedule Follow-up Appointment: Yes Medication Reconciliation completed and provided to Patient/Care No Orpah Hausner: Provided on Clinical Summary of Care: 10/03/2016 Form Type Recipient Paper Patient [employer] Electronic Signature(s) Signed: 10/03/2016 9:39:59 AM By: Ruthine Dose Entered By: Ruthine Dose on 10/03/2016 09:39:59 [name] ([id number]) -------------------------------------------------------------------------------- General Visit Notes Details Patient Name: [name]. Date of Service: 10/03/2016 8:45 AM Medical Record Number: [id number] Patient Account Number: [id number] Date of Birth/Sex: 03-03-1951 (66 y.o. Male) Treating RN: Ahmed Prima Primary Care Raja Liska: Jenna Luo Other Clinician: Referring Trisa Cranor: Jenna Luo Treating Janal Haak/Extender: Frann Rider in Treatment: 7 Notes Pt was here for an apt and Dr. Con Memos was talking to him about his old venous and arterial studies from 2013 and how he needed to get them done again. The pt started to get very angry and making mad faces at Dr. Con Memos saying he was not going to have them done again. He was balling up his fist. At one point he threw the chux off the chair and was trying to come out of the chair toward Dr. Con Memos. Dr. Con Memos was talking nicely and respectfully to the pt trying to calm him down. The pt calmed down and stated that he would have the studies done and said he wanted to go to [facility] to have them done. The pt did refused to have his kerlix/coban wrap put back on so Dr. Con Memos told the pt to wear his compression stocking. I placed a  BFD and sorbact with hydrogel on the pt. Kim the nurse when in there with me to do his dressing. Electronic  Signature(s) Signed: 10/03/2016 4:36:14 PM By: Alric Quan Entered By: Alric Quan on 10/03/2016 15:26:03 [name] ([id number]) -------------------------------------------------------------------------------- Lower Extremity Assessment Details Patient Name: ALVER, LOPORTO. Date of Service: 10/03/2016 8:45 AM Medical Record Number: [id number] Patient Account Number: [id number] Date of Birth/Sex: 1950/08/18 (65 y.o. Male) Treating RN: Carolyne Fiscal, Debi Primary Care Josanne Boerema: Jenna Luo Other Clinician: Referring Vere Diantonio: Jenna Luo Treating Rosalynd Mcwright/Extender: Frann Rider in Treatment: 7 Edema Assessment Assessed: [Left: No] [Right: No] E[Left: dema] [Right: :] Calf Left: Right: Point of Measurement: 35 cm From Medial Instep cm 39.2 cm Ankle Left: Right: Point of Measurement: 10 cm From Medial Instep cm 24.2 cm Vascular Assessment Pulses: Dorsalis Pedis Doppler Audible: [Right:Yes] Posterior Tibial Extremity colors, hair growth, and conditions: Extremity Color: [Right:Hyperpigmented] Temperature of Extremity: [Right:Warm] Capillary Refill: [Right:< 3 seconds] Toe Nail Assessment Left: Right: Thick: Yes Discolored: Yes Deformed: Yes Improper Length and Hygiene: No Electronic Signature(s) Signed: 10/03/2016 4:36:14 PM By: Alric Quan Entered By: Alric Quan on 10/03/2016 08:57:53 Houdek, Eric Form ([id number]) -------------------------------------------------------------------------------- Multi Wound Chart Details Patient Name: [name]. Date of Service: 10/03/2016 8:45 AM Medical Record Number: [id number] Patient Account Number: [id number] Date of Birth/Sex: 16-Jul-1951 (66 y.o. Male) Treating RN: Ahmed Prima Primary Care Aavya Shafer: Jenna Luo Other Clinician: Referring Shakiah Wester: Jenna Luo Treating  Terra Aveni/Extender: Frann Rider in Treatment: 7 Vital Signs Height(in): 72 Pulse(bpm): 53 Weight(lbs): 250 Blood Pressure 137/66 (mmHg): Body Mass Index(BMI): 34 Temperature(F): 98.1 Respiratory Rate 18 (breaths/min): Photos: [1:No Photos] [N/A:N/A] Wound Location: [1:Right Lower Leg - Lateral] [N/A:N/A] Wounding Event: [1:Trauma] [N/A:N/A] Primary Etiology: [1:Diabetic Wound/Ulcer of the Lower Extremity] [N/A:N/A] Comorbid History: [1:Chronic Obstructive Pulmonary Disease (COPD), Arrhythmia, Hypertension, Myocardial Infarction, Type II Diabetes] [N/A:N/A] Date Acquired: [1:05/14/2016] [N/A:N/A] Weeks of Treatment: [1:7] [N/A:N/A] Wound Status: [1:Open] [N/A:N/A] Measurements L x W x D 5.6x2x0.2 [N/A:N/A] (cm) Area (cm) : [1:8.796] [N/A:N/A] Volume (cm) : [1:1.759] [N/A:N/A] % Reduction in Area: [1:39.80%] [N/A:N/A] % Reduction in Volume: -20.40% [N/A:N/A] Classification: [1:Grade 1] [N/A:N/A] Exudate Amount: [1:Large] [N/A:N/A] Exudate Type: [1:Serosanguineous] [N/A:N/A] Exudate Color: [1:red, brown] [N/A:N/A] Wound Margin: [1:Flat and Intact] [N/A:N/A] Granulation Amount: [1:Medium (34-66%)] [N/A:N/A] Granulation Quality: [1:Pink, Pale] [N/A:N/A] Necrotic Amount: [1:Medium (34-66%)] [N/A:N/A] Exposed Structures: [1:Fat Layer (Subcutaneous Tissue) Exposed: Yes] [N/A:N/A] Fascia: No Tendon: No Muscle: No Joint: No Bone: No Epithelialization: Small (1-33%) N/A N/A Periwound Skin Texture: Scarring: Yes N/A N/A Excoriation: No Induration: No Callus: No Crepitus: No Rash: No Periwound Skin Maceration: Yes N/A N/A Moisture: Dry/Scaly: No Periwound Skin Color: Hemosiderin Staining: Yes N/A N/A Mottled: Yes Atrophie Blanche: No Cyanosis: No Ecchymosis: No Erythema: No Pallor: No Rubor: No Temperature: No Abnormality N/A N/A Tenderness on Yes N/A N/A Palpation: Wound Preparation: Ulcer Cleansing: N/A N/A Rinsed/Irrigated with Saline, Other: surg  scrub and water Topical Anesthetic Applied: Other: lidocaine 4% Treatment Notes Wound #1 (Right, Lateral Lower Leg) 1. Cleansed with: Clean wound with Normal Saline Cleanse wound with antibacterial soap and water 2. Anesthetic Topical Lidocaine 4% cream to wound bed prior to debridement 4. Dressing Applied: Hydrogel Other dressing (specify in notes) 5. Secondary Dressing Applied Bordered Foam Dressing Notes cutimed sorbact RURY, NISBETT ([id number]) Electronic Signature(s) Signed: 10/03/2016 10:29:11 AM By: Christin Fudge MD, FACS Previous Signature: 10/03/2016 10:07:18 AM Version By: Christin Fudge MD, FACS Entered By: Christin Fudge on 10/03/2016 10:29:11 [name] ([id number]) -------------------------------------------------------------------------------- [place] Details Patient Name: FAHED, GAFFKE. Date of Service: 10/03/2016 8:45 AM Medical Record Number: [id number] Patient Account Number: [id number] Date of Birth/Sex: 1950/09/12 (65  y.o. Male) Treating RN: Carolyne Fiscal, Debi Primary Care Shavona Gunderman: Jenna Luo Other Clinician: Referring Stace Peace: Jenna Luo Treating Aneisa Karren/Extender: Frann Rider in Treatment: 7 Active Inactive ` Orientation to the Wound Care Program Nursing Diagnoses: Knowledge deficit related to the wound healing center program Goals: Patient/caregiver will verbalize understanding of the [place] Program Date Initiated: 08/15/2016 Target Resolution Date: 09/05/2016 Goal Status: Active Interventions: Provide education on orientation to the wound center Notes: ` Venous Leg Ulcer Nursing Diagnoses: Knowledge deficit related to disease process and management Potential for venous Insuffiency (use before diagnosis confirmed) Goals: Patient will maintain optimal edema control Date Initiated: 08/15/2016 Target Resolution Date: 09/05/2016 Goal Status: Active Patient/caregiver will verbalize  understanding of disease process and disease management Date Initiated: 08/15/2016 Target Resolution Date: 09/05/2016 Goal Status: Active Verify adequate tissue perfusion prior to therapeutic compression application Date Initiated: 08/15/2016 Target Resolution Date: 09/05/2016 Goal Status: Active Interventions: Assess peripheral edema status every visit. Compression as ordered MATHEWS, MOW ([id number]) Provide education on venous insufficiency Treatment Activities: Therapeutic compression applied : 08/15/2016 Notes: ` Wound/Skin Impairment Nursing Diagnoses: Impaired tissue integrity Knowledge deficit related to ulceration/compromised skin integrity Goals: Patient/caregiver will verbalize understanding of skin care regimen Date Initiated: 08/15/2016 Target Resolution Date: 09/05/2016 Goal Status: Active Ulcer/skin breakdown will have a volume reduction of 30% by week 4 Date Initiated: 08/15/2016 Target Resolution Date: 09/05/2016 Goal Status: Active Ulcer/skin breakdown will have a volume reduction of 50% by week 8 Date Initiated: 08/15/2016 Target Resolution Date: 09/05/2016 Goal Status: Active Ulcer/skin breakdown will have a volume reduction of 80% by week 12 Date Initiated: 08/15/2016 Target Resolution Date: 09/05/2016 Goal Status: Active Ulcer/skin breakdown will heal within 14 weeks Date Initiated: 08/15/2016 Target Resolution Date: 09/05/2016 Goal Status: Active Interventions: Assess patient/caregiver ability to obtain necessary supplies Assess patient/caregiver ability to perform ulcer/skin care regimen upon admission and as needed Assess ulceration(s) every visit Provide education on smoking Provide education on ulcer and skin care Treatment Activities: Referred to DME Vinia Jemmott for dressing supplies : 08/15/2016 Skin care regimen initiated : 08/15/2016 Topical wound management initiated : 08/15/2016 Notes: SHOURYA, PERROT ([id number]) Electronic Signature(s) Signed:  10/03/2016 4:36:14 PM By: Alric Quan Entered By: Alric Quan on 10/03/2016 08:57:56 Manheim, Eric Form ([id number]) -------------------------------------------------------------------------------- Pain Assessment Details Patient Name: [name]. Date of Service: 10/03/2016 8:45 AM Medical Record Number: [id number] Patient Account Number: [id number] Date of Birth/Sex: [birth year] (66 y.o. Male) Treating RN: Ahmed Prima Primary Care Anairis Knick: Jenna Luo Other Clinician: Referring Laquanna Veazey: Jenna Luo Treating Lynia Landry/Extender: Frann Rider in Treatment: 7 Active Problems Location of Pain Severity and Description of Pain Patient Has Paino No Site Locations With Dressing Change: No Pain Management and Medication Current Pain Management: Electronic Signature(s) Signed: 10/03/2016 4:36:14 PM By: Alric Quan Entered By: Alric Quan on 10/03/2016 08:43:58 Corcoran, Eric Form ([id number]) -------------------------------------------------------------------------------- Patient/Caregiver Education Details Patient Name: [name]. Date of Service: 10/03/2016 8:45 AM Medical Record Number: [id number] Patient Account Number: [id number] Date of Birth/Gender: Feb 20, 1951 (66 y.o. Male) Treating RN: Ahmed Prima Primary Care Physician: Jenna Luo Other Clinician: Referring Physician: Jenna Luo Treating Physician/Extender: Frann Rider in Treatment: 7 Education Assessment Education Provided To: Patient Education Topics Provided Wound/Skin Impairment: Handouts: Other: change dressing as ordered Methods: Demonstration, Explain/Verbal Responses: State content correctly Electronic Signature(s) Signed: 10/03/2016 4:36:14 PM By: Alric Quan Entered By: Alric Quan on 10/03/2016 09:08:44 [name] ([id number]) -------------------------------------------------------------------------------- Wound Assessment  Details Patient Name: [name]. Date of Service: 10/03/2016 8:45 AM Medical Record Number:  [id number] Patient Account Number: [id number] Date of Birth/Sex: 03-18-1951 (66 y.o. Male) Treating RN: Carolyne Fiscal, Debi Primary Care Kazim Corrales: Jenna Luo Other Clinician: Referring Nello Corro: Jenna Luo Treating Mahlon Gabrielle/Extender: Frann Rider in Treatment: 7 Wound Status Wound Number: 1 Primary Diabetic Wound/Ulcer of the Lower Etiology: Extremity Wound Location: Right Lower Leg - Lateral Wound Open Wounding Event: Trauma Status: Date Acquired: 05/14/2016 Comorbid Chronic Obstructive Pulmonary Disease Weeks Of Treatment: 7 History: (COPD), Arrhythmia, Hypertension, Clustered Wound: No Myocardial Infarction, Type II Diabetes Photos Photo Uploaded By: Alric Quan on 10/03/2016 11:47:00 Wound Measurements Length: (cm) 5.6 Width: (cm) 2 Depth: (cm) 0.2 Area: (cm) 8.796 Volume: (cm) 1.759 % Reduction in Area: 39.8% % Reduction in Volume: -20.4% Epithelialization: Small (1-33%) Tunneling: No Undermining: No Wound Description Classification: Grade 1 Wound Margin: Flat and Intact Exudate Amount: Large Exudate Type: Serosanguineous Exudate Color: red, brown Foul Odor After Cleansing: No Slough/Fibrino Yes Wound Bed Granulation Amount: Medium (34-66%) Exposed Structure Granulation Quality: Pink, Pale Fascia Exposed: No Necrotic Amount: Medium (34-66%) Fat Layer (Subcutaneous Tissue) Exposed: Yes Necrotic Quality: Adherent Slough Tendon Exposed: No HAMADI, GOLDMANN L. ([id number]) Muscle Exposed: No Joint Exposed: No Bone Exposed: No Periwound Skin Texture Texture Color No Abnormalities Noted: No No Abnormalities Noted: No Callus: No Atrophie Blanche: No Crepitus: No Cyanosis: No Excoriation: No Ecchymosis: No Induration: No Erythema: No Rash: No Hemosiderin Staining: Yes Scarring: Yes Mottled: Yes Pallor: No Moisture Rubor: No No  Abnormalities Noted: No Dry / Scaly: No Temperature / Pain Maceration: Yes Temperature: No Abnormality Tenderness on Palpation: Yes Wound Preparation Ulcer Cleansing: Rinsed/Irrigated with Saline, Other: surg scrub and water, Topical Anesthetic Applied: Other: lidocaine 4%, Treatment Notes Wound #1 (Right, Lateral Lower Leg) 1. Cleansed with: Clean wound with Normal Saline Cleanse wound with antibacterial soap and water 2. Anesthetic Topical Lidocaine 4% cream to wound bed prior to debridement 4. Dressing Applied: Hydrogel Other dressing (specify in notes) 5. Secondary Dressing Applied Bordered Foam Dressing Notes cutimed sorbact Electronic Signature(s) Signed: 10/03/2016 4:36:14 PM By: Alric Quan Entered By: Alric Quan on 10/03/2016 08:55:29 [name] ([id number]) -------------------------------------------------------------------------------- [place] Details Patient Name: [name]. Date of Service: 10/03/2016 8:45 AM Medical Record Number: [id number] Patient Account Number: [id number] Date of Birth/Sex: Dec 31, 1950 (66 y.o. Male) Treating RN: Ahmed Prima Primary Care Emori Mumme: Jenna Luo Other Clinician: Referring Zaccai Chavarin: Jenna Luo Treating Shanora Christensen/Extender: Frann Rider in Treatment: 7 Vital Signs Time Taken: 08:44 Temperature (F): 98.1 Height (in): 72 Pulse (bpm): 53 Weight (lbs): 250 Respiratory Rate (breaths/min): 18 Body Mass Index (BMI): 33.9 Blood Pressure (mmHg): 137/66 Reference Range: 80 - 120 mg / dl Electronic Signature(s) Signed: 10/03/2016 4:36:14 PM By: Alric Quan Entered By: Alric Quan on 10/03/2016 08:46:21

## 2016-10-09 ENCOUNTER — Other Ambulatory Visit: Payer: Self-pay | Admitting: *Deleted

## 2016-10-09 DIAGNOSIS — L97212 Non-pressure chronic ulcer of right calf with fat layer exposed: Secondary | ICD-10-CM

## 2016-10-10 ENCOUNTER — Ambulatory Visit: Payer: Commercial Managed Care - HMO | Admitting: Surgery

## 2016-10-17 ENCOUNTER — Encounter: Payer: Medicare HMO | Admitting: Surgery

## 2016-10-17 DIAGNOSIS — E039 Hypothyroidism, unspecified: Secondary | ICD-10-CM | POA: Diagnosis not present

## 2016-10-17 DIAGNOSIS — I10 Essential (primary) hypertension: Secondary | ICD-10-CM | POA: Diagnosis not present

## 2016-10-17 DIAGNOSIS — E11622 Type 2 diabetes mellitus with other skin ulcer: Secondary | ICD-10-CM | POA: Diagnosis not present

## 2016-10-17 DIAGNOSIS — L97212 Non-pressure chronic ulcer of right calf with fat layer exposed: Secondary | ICD-10-CM | POA: Diagnosis not present

## 2016-10-17 DIAGNOSIS — F17218 Nicotine dependence, cigarettes, with other nicotine-induced disorders: Secondary | ICD-10-CM | POA: Diagnosis not present

## 2016-10-17 DIAGNOSIS — Z8673 Personal history of transient ischemic attack (TIA), and cerebral infarction without residual deficits: Secondary | ICD-10-CM | POA: Diagnosis not present

## 2016-10-17 DIAGNOSIS — J449 Chronic obstructive pulmonary disease, unspecified: Secondary | ICD-10-CM | POA: Diagnosis not present

## 2016-10-17 DIAGNOSIS — I252 Old myocardial infarction: Secondary | ICD-10-CM | POA: Diagnosis not present

## 2016-10-17 DIAGNOSIS — I872 Venous insufficiency (chronic) (peripheral): Secondary | ICD-10-CM | POA: Diagnosis not present

## 2016-10-18 NOTE — Progress Notes (Signed)
Brad Taylor, Brad Taylor ([id number]) Visit Report for 10/17/2016 Arrival Information Details Patient Name: Brad Taylor, Brad Taylor. Date of Service: 10/17/2016 8:45 AM Medical Record Number: [id number] Patient Account Number: [id number] Date of Birth/Sex: [birth year] (66 y.o. Male) Treating RN: Brad Taylor Primary Care Brad Taylor: Brad Taylor Other Clinician: Referring Brad Taylor: Brad Taylor Treating Brad Taylor/Extender: Brad Taylor in Treatment: 9 Visit Information History Since Last Visit Added or deleted any medications: No Patient Arrived: Ambulatory Any new allergies or adverse reactions: No Arrival Time: 08:48 Had a fall or experienced change in No Accompanied By: wife activities of daily living that may affect Transfer Assistance: None risk of falls: Patient Identification Verified: Yes Signs or symptoms of abuse/neglect since last No Secondary Verification Process Yes visito Completed: Hospitalized since last visit: No Patient Requires Transmission- No Has Dressing in Place as Prescribed: Yes Based Precautions: Pain Present Now: No Patient Has Alerts: Yes Patient Alerts: Hgb A1c 8.4 10/10/2016 [facility] ABI: L) 0.99 R) 0.80 Electronic Signature(s) Signed: 10/17/2016 5:26:13 PM By: Brad Cool, RN, BSN, Kim RN, BSN Entered By: Brad Cool, RN, BSN, Brad Taylor on 10/17/2016 09:38:17 Brad Taylor ([id number]) -------------------------------------------------------------------------------- Encounter Discharge Information Details Patient Name: Brad Taylor, Brad L. Date of Service: 10/17/2016 8:45 AM Medical Record Number: [id number] Patient Account Number: [id number] Date of Birth/Sex: 1951-06-30 (66 y.o. Male) Treating RN: Brad Taylor Primary Care Brad Taylor: Brad Taylor Other Clinician: Referring Brad Taylor: Brad Taylor Treating Brad Taylor/Extender: Brad Taylor in Treatment: 9 Encounter Discharge Information Items Discharge Pain Level: 0 Discharge Condition: Stable Ambulatory Status:  Ambulatory Discharge Destination: Home Transportation: Private Auto Accompanied By: wife Schedule Follow-up Appointment: Yes Medication Reconciliation completed and provided to Patient/Care Yes Brad Taylor: Provided on Clinical Summary of Care: 10/17/2016 Taylor Type Recipient Paper Patient [employer] Electronic Signature(s) Signed: 10/17/2016 5:26:13 PM By: Brad Cool RN, BSN, Kim RN, BSN Previous Signature: 10/17/2016 9:24:47 AM Version By: Ruthine Dose Entered By: Brad Cool RN, BSN, Brad Taylor on 10/17/2016 09:25:57 Brad Taylor ([id number]) -------------------------------------------------------------------------------- Lower Extremity Assessment Details Patient Name: Brad Taylor, [name] Date of Service: 10/17/2016 8:45 AM Medical Record Number: [id number] Patient Account Number: [id number] Date of Birth/Sex: November 23, 1950 (66 y.o. Male) Treating RN: Brad Taylor Primary Care Daleyssa Loiselle: Brad Taylor Other Clinician: Referring Brad Taylor: Brad Taylor Treating Vimal Derego/Extender: Brad Taylor in Treatment: 9 Edema Assessment Assessed: [Left: No] [Right: No] E[Left: dema] [Right: :] Calf Left: Right: Point of Measurement: 35 cm From Medial Instep cm 40.4 cm Ankle Left: Right: Point of Measurement: 10 cm From Medial Instep cm 26 cm Vascular Assessment Claudication: Claudication Assessment [Right:None] Pulses: Dorsalis Pedis Palpable: [Right:No] Doppler Audible: [Right:Yes] Posterior Tibial Palpable: [Right:No] Doppler Audible: [Right:Yes] Extremity colors, hair growth, and conditions: Extremity Color: [Right:Hyperpigmented] Hair Growth on Extremity: [Right:Yes] Temperature of Extremity: [Right:Warm] Capillary Refill: [Right:< 3 seconds] Dependent Rubor: [Right:No] Blanched when Elevated: [Right:No] Lipodermatosclerosis: [Right:Yes] Toe Nail Assessment Left: Right: Thick: Yes Discolored: Yes Deformed: Yes Brad Taylor ([id number]) Improper Length and Hygiene: No Electronic  Signature(s) Signed: 10/17/2016 5:26:13 PM By: Brad Cool, RN, BSN, Kim RN, BSN Entered By: Brad Cool, RN, BSN, Brad Taylor on 10/17/2016 08:57:34 Taylor, Brad Taylor ([id number]) -------------------------------------------------------------------------------- Multi Wound Chart Details Patient Name: Brad Taylor. Date of Service: 10/17/2016 8:45 AM Medical Record Number: [id number] Patient Account Number: [id number] Date of Birth/Sex: 1950/12/29 (66 y.o. Male) Treating RN: Brad Taylor Primary Care Brad Taylor: Brad Taylor Other Clinician: Referring Anamaria Dusenbury: Brad Taylor Treating Brad Taylor/Extender: Brad Taylor in Treatment: 9 Vital Signs Height(in): 72 Pulse(bpm): 55 Weight(lbs): 250 Blood Pressure 139/73 (mmHg): Body Mass Index(BMI): 34 Temperature(F): 97.7 Respiratory Rate 18 (breaths/min): Photos: [  N/A:N/A] Wound Location: Right Lower Leg - Lateral N/A N/A Wounding Event: Trauma N/A N/A Primary Etiology: Diabetic Wound/Ulcer of N/A N/A the Lower Extremity Comorbid History: Chronic Obstructive N/A N/A Pulmonary Disease (COPD), Arrhythmia, Hypertension, Myocardial Infarction, Type II Diabetes Date Acquired: 05/14/2016 N/A N/A Weeks of Treatment: 9 N/A N/A Wound Status: Open N/A N/A Measurements L x W x D 5.4x2x0.1 N/A N/A (cm) Area (cm) : 8.482 N/A N/A Volume (cm) : 0.848 N/A N/A % Reduction in Area: 41.90% N/A N/A % Reduction in Volume: 42.00% N/A N/A Classification: Grade 1 N/A N/A Exudate Amount: Large N/A N/A Exudate Type: Serosanguineous N/A N/A Exudate Color: red, brown N/A N/A Wound Margin: Flat and Intact N/A N/A Brad Taylor, BEAUCHAINE. ([id number]) Granulation Amount: Medium (34-66%) N/A N/A Granulation Quality: Pink, Pale N/A N/A Necrotic Amount: Small (1-33%) N/A N/A Exposed Structures: Fat Layer (Subcutaneous N/A N/A Tissue) Exposed: Yes Fascia: No Tendon: No Muscle: No Joint: No Bone: No Epithelialization: Small (1-33%) N/A N/A Debridement:  Debridement ([id number] N/A N/A [id number]) Pre-procedure 09:08 N/A N/A Verification/Time Out Taken: Pain Control: Other N/A N/A Tissue Debrided: Fibrin/Slough, N/A N/A Subcutaneous Level: Skin/Subcutaneous N/A N/A Tissue Debridement Area (sq 10.8 N/A N/A cm): Instrument: Curette N/A N/A Bleeding: Minimum N/A N/A Hemostasis Achieved: Pressure N/A N/A Procedural Pain: 0 N/A N/A Post Procedural Pain: 0 N/A N/A Post Debridement 5.4x2x0.1 N/A N/A Measurements L x W x D (cm) Post Debridement 0.848 N/A N/A Volume: (cm) Periwound Skin Texture: Excoriation: Yes N/A N/A Scarring: Yes Induration: No Callus: No Crepitus: No Rash: No Periwound Skin Maceration: Yes N/A N/A Moisture: Dry/Scaly: No Periwound Skin Color: Hemosiderin Staining: Yes N/A N/A Mottled: Yes Atrophie Blanche: No Cyanosis: No Ecchymosis: No Erythema: No Pallor: No Rubor: No Temperature: No Abnormality N/A N/A Brad Taylor, Brad L. ([id number]) Tenderness on Yes N/A N/A Palpation: Wound Preparation: Ulcer Cleansing: N/A N/A Rinsed/Irrigated with Saline Topical Anesthetic Applied: Other: lidocaine 4% Procedures Performed: Debridement N/A N/A Treatment Notes Electronic Signature(s) Signed: 10/17/2016 9:22:15 AM By: Christin Fudge MD, FACS Entered By: Christin Fudge on 10/17/2016 09:22:15 Brad Taylor ([id number]) -------------------------------------------------------------------------------- [place] Details Patient Name: Brad Taylor, OTTAWAY. Date of Service: 10/17/2016 8:45 AM Medical Record Number: [id number] Patient Account Number: [id number] Date of Birth/Sex: 1950-12-08 (66 y.o. Male) Treating RN: Brad Taylor Primary Care Fairy Ashlock: Brad Taylor Other Clinician: Referring Makenzey Nanni: Brad Taylor Treating Johonna Binette/Extender: Brad Taylor in Treatment: 9 Active Inactive ` Orientation to the Wound Care Program Nursing Diagnoses: Knowledge deficit related to the wound healing  center program Goals: Patient/caregiver will verbalize understanding of the [place] Program Date Initiated: 08/15/2016 Target Resolution Date: 09/05/2016 Goal Status: Active Interventions: Provide education on orientation to the wound center Notes: ` Venous Leg Ulcer Nursing Diagnoses: Knowledge deficit related to disease process and management Potential for venous Insuffiency (use before diagnosis confirmed) Goals: Patient will maintain optimal edema control Date Initiated: 08/15/2016 Target Resolution Date: 09/05/2016 Goal Status: Active Patient/caregiver will verbalize understanding of disease process and disease management Date Initiated: 08/15/2016 Target Resolution Date: 09/05/2016 Goal Status: Active Verify adequate tissue perfusion prior to therapeutic compression application Date Initiated: 08/15/2016 Target Resolution Date: 09/05/2016 Goal Status: Active Interventions: Assess peripheral edema status every visit. Compression as ordered Brad Taylor, Brad Taylor ([id number]) Provide education on venous insufficiency Treatment Activities: Therapeutic compression applied : 08/15/2016 Notes: ` Wound/Skin Impairment Nursing Diagnoses: Impaired tissue integrity Knowledge deficit related to ulceration/compromised skin integrity Goals: Patient/caregiver will verbalize understanding of skin care regimen Date Initiated: 08/15/2016 Target Resolution Date: 09/05/2016 Goal Status:  Active Ulcer/skin breakdown will have a volume reduction of 30% by week 4 Date Initiated: 08/15/2016 Target Resolution Date: 09/05/2016 Goal Status: Active Ulcer/skin breakdown will have a volume reduction of 50% by week 8 Date Initiated: 08/15/2016 Target Resolution Date: 09/05/2016 Goal Status: Active Ulcer/skin breakdown will have a volume reduction of 80% by week 12 Date Initiated: 08/15/2016 Target Resolution Date: 09/05/2016 Goal Status: Active Ulcer/skin breakdown will heal within 14 weeks Date  Initiated: 08/15/2016 Target Resolution Date: 09/05/2016 Goal Status: Active Interventions: Assess patient/caregiver ability to obtain necessary supplies Assess patient/caregiver ability to perform ulcer/skin care regimen upon admission and as needed Assess ulceration(s) every visit Provide education on smoking Provide education on ulcer and skin care Treatment Activities: Referred to DME Vanassa Penniman for dressing supplies : 08/15/2016 Skin care regimen initiated : 08/15/2016 Topical wound management initiated : 08/15/2016 Notes: Brad Taylor, Brad Taylor ([id number]) Electronic Signature(s) Signed: 10/17/2016 5:26:13 PM By: Brad Cool, RN, BSN, Kim RN, BSN Entered By: Brad Cool, RN, BSN, Brad Taylor on 10/17/2016 08:57:39 Gibler, Brad Taylor ([id number]) -------------------------------------------------------------------------------- Pain Assessment Details Patient Name: Brad Taylor, Brad L. Date of Service: 10/17/2016 8:45 AM Medical Record Number: [id number] Patient Account Number: [id number] Date of Birth/Sex: [birth year] (66 y.o. Male) Treating RN: Brad Taylor Primary Care Ott Zimmerle: Brad Taylor Other Clinician: Referring Cross Jorge: Brad Taylor Treating Renesme Kerrigan/Extender: Brad Taylor in Treatment: 9 Active Problems Location of Pain Severity and Description of Pain Patient Has Paino No Site Locations With Dressing Change: No Pain Management and Medication Current Pain Management: Electronic Signature(s) Signed: 10/17/2016 5:26:13 PM By: Brad Cool, RN, BSN, Kim RN, BSN Entered By: Brad Cool, RN, BSN, Brad Taylor on 10/17/2016 08:48:30 Brad Taylor ([id number]) -------------------------------------------------------------------------------- Patient/Caregiver Education Details Patient Name: Brad Taylor. Date of Service: 10/17/2016 8:45 AM Medical Record Number: [id number] Patient Account Number: [id number] Date of Birth/Gender: Sep 01, 1950 (66 y.o. Male) Treating RN: Brad Taylor Primary Care Physician: Brad Taylor Other Clinician: Referring Physician: Jenna Taylor Treating Physician/Extender: Brad Taylor in Treatment: 9 Education Assessment Education Provided To: Patient Education Topics Provided Venous: Handouts: Controlling Swelling with Multilayered Compression Wraps Methods: Demonstration, Explain/Verbal Responses: State content correctly Wound/Skin Impairment: Handouts: Caring for Your Ulcer Methods: Demonstration, Explain/Verbal Responses: State content correctly Electronic Signature(s) Signed: 10/17/2016 5:26:13 PM By: Brad Cool, RN, BSN, Kim RN, BSN Entered By: Brad Cool, RN, BSN, Brad Taylor on 10/17/2016 09:32:44 Brad Taylor ([id number]) -------------------------------------------------------------------------------- Wound Assessment Details Patient Name: Brad Taylor, Brad L. Date of Service: 10/17/2016 8:45 AM Medical Record Number: [id number] Patient Account Number: [id number] Date of Birth/Sex: 1951-02-22 (66 y.o. Male) Treating RN: Brad Taylor Primary Care Louise Rawson: Brad Taylor Other Clinician: Referring Trinitee Horgan: Brad Taylor Treating Marialy Urbanczyk/Extender: Brad Taylor in Treatment: 9 Wound Status Wound Number: 1 Primary Diabetic Wound/Ulcer of the Lower Etiology: Extremity Wound Location: Right Lower Leg - Lateral Wound Open Wounding Event: Trauma Status: Date Acquired: 05/14/2016 Comorbid Chronic Obstructive Pulmonary Disease Weeks Of Treatment: 9 History: (COPD), Arrhythmia, Hypertension, Clustered Wound: No Myocardial Infarction, Type II Diabetes Photos Wound Measurements Length: (cm) 5.4 Width: (cm) 2 Depth: (cm) 0.1 Area: (cm) 8.482 Volume: (cm) 0.848 % Reduction in Area: 41.9% % Reduction in Volume: 42% Epithelialization: Small (1-33%) Tunneling: No Undermining: No Wound Description Classification: Grade 1 Wound Margin: Flat and Intact Exudate Amount: Large Exudate Type: Serosanguineous Exudate Color: red, brown Foul Odor After  Cleansing: No Slough/Fibrino Yes Wound Bed Granulation Amount: Medium (34-66%) Exposed Structure Granulation Quality: Pink, Pale Fascia Exposed: No Necrotic Amount: Small (1-33%) Fat Layer (Subcutaneous Tissue) Exposed: Yes Necrotic Quality: Adherent Slough Tendon Exposed: No Muscle Exposed: No  Joint Exposed: No Bone Exposed: No Brad Taylor, FANFAN. ([id number]) Periwound Skin Texture Texture Color No Abnormalities Noted: No No Abnormalities Noted: No Callus: No Atrophie Blanche: No Crepitus: No Cyanosis: No Excoriation: Yes Ecchymosis: No Induration: No Erythema: No Rash: No Hemosiderin Staining: Yes Scarring: Yes Mottled: Yes Pallor: No Moisture Rubor: No No Abnormalities Noted: No Dry / Scaly: No Temperature / Pain Maceration: Yes Temperature: No Abnormality Tenderness on Palpation: Yes Wound Preparation Ulcer Cleansing: Rinsed/Irrigated with Saline Topical Anesthetic Applied: Other: lidocaine 4%, Treatment Notes Wound #1 (Right, Lateral Lower Leg) 1. Cleansed with: Clean wound with Normal Saline 2. Anesthetic Topical Lidocaine 4% cream to wound bed prior to debridement 4. Dressing Applied: Other dressing (specify in notes) 5. Secondary Dressing Applied ABD Pad 7. Secured with 3 Layer Compression System - Right Lower Extremity Notes cutimed sorbact and hydrogel Electronic Signature(s) Signed: 10/17/2016 5:26:13 PM By: Brad Cool, RN, BSN, Kim RN, BSN Entered By: Brad Cool, RN, BSN, Brad Taylor on 10/17/2016 08:54:05 Brad Taylor ([id number]) -------------------------------------------------------------------------------- [place] Details Patient Name: Brad Taylor. Date of Service: 10/17/2016 8:45 AM Medical Record Number: [id number] Patient Account Number: [id number] Date of Birth/Sex: 1951/03/16 (66 y.o. Male) Treating RN: Brad Taylor Primary Care Jasmain Ahlberg: Brad Taylor Other Clinician: Referring Luria Rosario: Brad Taylor Treating Sharine Cadle/Extender: Brad Taylor in Treatment: 9 Vital Signs Time Taken: 08:48 Temperature (F): 97.7 Height (in): 72 Pulse (bpm): 55 Weight (lbs): 250 Respiratory Rate (breaths/min): 18 Body Mass Index (BMI): 33.9 Blood Pressure (mmHg): 139/73 Reference Range: 80 - 120 mg / dl Electronic Signature(s) Signed: 10/17/2016 5:26:13 PM By: Brad Cool, RN, BSN, Kim RN, BSN Entered By: Brad Cool, RN, BSN, Brad Taylor on 10/17/2016 08:48:51

## 2016-10-18 NOTE — Progress Notes (Signed)
Brad Taylor ([id number]) Visit Report for 10/17/2016 Chief Complaint Document Details Patient Name: Brad Taylor. Date of Service: 10/17/2016 8:45 AM Medical Record Number: [id number] Patient Account Number: [id number] Date of Birth/Sex: Jun 08, 1951 (66 y.o. Male) Treating RN: Cornell Barman Primary Care Provider: Jenna Luo Other Clinician: Referring Provider: Jenna Luo Treating Provider/Extender: Frann Rider in Treatment: 9 Information Obtained from: Patient Chief Complaint Patient presents for treatment of an open ulcer due to venous insufficiency along with diabetes mellitus, to his right lower extremity which she's had for about 3 months Electronic Signature(s) Signed: 10/17/2016 9:22:28 AM By: Christin Fudge MD, FACS Entered By: Christin Fudge on 10/17/2016 09:22:28 Brad Taylor ([id number]) -------------------------------------------------------------------------------- Debridement Details Patient Name: Brad Taylor. Date of Service: 10/17/2016 8:45 AM Medical Record Number: [id number] Patient Account Number: [id number] Date of Birth/Sex: April 26, 1951 (66 y.o. Male) Treating RN: Cornell Barman Primary Care Provider: Jenna Luo Other Clinician: Referring Provider: Jenna Luo Treating Provider/Extender: Frann Rider in Treatment: 9 Debridement Performed for Wound #1 Right,Lateral Lower Leg Assessment: Performed By: Physician Christin Fudge, MD Debridement: Debridement Pre-procedure Yes - 09:08 Verification/Time Out Taken: Start Time: 09:09 Pain Control: Other : lidocaine 4% Level: Skin/Subcutaneous Tissue Total Area Debrided (L x 5.4 (cm) x 2 (cm) = 10.8 (cm) W): Tissue and other Viable, Non-Viable, Fibrin/Slough, Subcutaneous material debrided: Instrument: Curette Bleeding: Minimum Hemostasis Achieved: Pressure End Time: 09:10 Procedural Pain: 0 Post Procedural Pain: 0 Post Debridement Measurements of Total Wound Length:  (cm) 5.4 Width: (cm) 2 Depth: (cm) 0.1 Volume: (cm) 0.848 Character of Wound/Ulcer Post Requires Further Debridement Debridement: Severity of Tissue Post Debridement: Fat layer exposed Post Procedure Diagnosis Same as Pre-procedure Electronic Signature(s) Signed: 10/17/2016 9:22:23 AM By: Christin Fudge MD, FACS Signed: 10/17/2016 5:26:13 PM By: Gretta Cool RN, BSN, Kim RN, BSN Entered By: Christin Fudge on 10/17/2016 09:22:23 Brad Taylor, Brad Taylor ([id number]) -------------------------------------------------------------------------------- HPI Details Patient Name: [name], [name] Date of Service: 10/17/2016 8:45 AM Medical Record Number: [id number] Patient Account Number: [id number] Date of Birth/Sex: 1951/03/16 (66 y.o. Male) Treating RN: Cornell Barman Primary Care Provider: Jenna Luo Other Clinician: Referring Provider: Jenna Luo Treating Provider/Extender: Frann Rider in Treatment: 9 History of Present Illness Location: right lower extremity Quality: Patient reports experiencing a dull pain to affected area(s). Severity: Patient states wound are getting worse. Duration: Patient has had the wound for > 3 months prior to seeking treatment at the wound center Timing: Pain in wound is Intermittent (comes and goes Context: The wound occurred when the patient had a lacerated wound which was treated with sutures which gaped and had an open ulceration Modifying Factors: Other treatment(s) tried include:been having on and Unna's boots applied by his PCP in the office Associated Signs and Symptoms: Patient reports having increase swelling. HPI Description: 66 year old gentleman with a past medical history of stroke, diabetes mellitus2 with hyperglycemia, CVA, hemiplegia, ahasia, cellulitis of the left gluteal area and is a smoker and smokes a packet of cigarettes a day. His past medical history also significant for COPD, thyroid disease and is status post cholecystectomy and  status post knee surgery in October 2017 the patient had a laceration to his right lateral shin and had a deep tear which was sutured with 4-0 Ethilon sutures but these opened out because of his chronic venous insufficiency and severe edema and the patient was seen by his PCP and put on Bactrim. He was also advised to do dressings with Silvadene and Unna's boots. We don't have notes from the [facility]  in [place] but he says he's had a thorough workup done both arterial and venous and was told that he has venous insufficiency and was to wear compression stockings of 30-40 mm variety. We'll try and obtain these notes. He also gives a history of a lung mass which was a cancer and treated with resection but no chemotherapy or radiation. 08/29/2016 -- not yet received any of his notes from the [facility] 09/05/2016 -- in spite of every effort on the patient's part and are part we have not been able to get any reports regarding his arterial and venous workup. At this stage I'm going to request fresh arterial duplex studies and venous reflux studies. 09/12/2016 -- the patient has not yet got his arterial and venous duplex study reports and refuses to do these tests again. He says he will get the reports back in about 2 weeks' time. He also says he has had a lot of pain and this lower extremity using Hydrofera Blue and asks to change the dressing material. 09/19/2016 -- patient's wife did talk to the doctor at the [place] and they will send as the old report of arterial and venous duplex studies and if need be they will repeat it again. Rash request will be sent by [place]. 09/26/2016 -- my office has sent the requests for arterial venous duplex studies to the [facility] and they have received it and the patient is waiting back for an appointment. October 03 2016 -- Notes reviewed from the [facility] were reviewed and in June 2016 he was scheduled to have a right upper lobe lung surgery by VATS and I presume  this was done as planned. A note from June 2017 stated that he was status post right upper lobectomy with mediastinal lymphadenectomy for a T2 Willden, Josafat L. ([id number]) N0 MX well-differentiated squamous cell carcinoma. After the multidisciplinary oncology conference he was recommended surveillance protocol. We also received some other notes which the patient has brought in today form November 2013, he had normal ABIs on 06/11/2012. Right was 0.9 to left was 1.12. Toe brachial index was 0.78 on the right and left was 0.67. He had a diagnosis of venous insufficiency and at that stage Unnas boots were applied. There are no recent notes from either arterial or venous duplex studies done since then. In discussing the previous workup and discussing the fact that he required a arterial duplex and a venous reflux study to be done, the patient suddenly started showing signs of being aggressive and rude and was being very aggressive in his conversation about what he wanted to and what he did not want to do. At this stage I had to firmly tell him to set up boundaries and not talk rudedly to either my staff or me and to not to threaten [place] with physical abuse. He calmed down for a while and agreed to get his workup done. 10/17/2016 -- he had an ABI examination of both lower extremities done at the [facility] and his right ABI was 0.80 and the left was 0.99. his venous duplex for reflux is to be done on March 26 Electronic Signature(s) Signed: 10/17/2016 9:22:43 AM By: Christin Fudge MD, FACS Previous Signature: 10/17/2016 9:13:31 AM Version By: Christin Fudge MD, FACS Entered By: Christin Fudge on 10/17/2016 09:22:43 Brad Taylor ([id number]) -------------------------------------------------------------------------------- Physical Exam Details Patient Name: Brad Taylor, Brad L. Date of Service: 10/17/2016 8:45 AM Medical Record Number: [id number] Patient Account Number: [id number] Date of  Birth/Sex:  12-04-1950 (66 y.o. Male) Treating RN: Cornell Barman Primary Care Provider: Jenna Luo Other Clinician: Referring Provider: Jenna Luo Treating Provider/Extender: Frann Rider in Treatment: 9 Constitutional . Pulse regular. Respirations normal and unlabored. Afebrile. . Eyes Nonicteric. Reactive to light. Ears, Nose, Mouth, and Throat Lips, teeth, and gums WNL.Marland Kitchen Moist mucosa without lesions. Neck supple and nontender. No palpable supraclavicular or cervical adenopathy. Normal sized without goiter. Respiratory WNL. No retractions.. Breath sounds WNL, No rubs, rales, rhonchi, or wheeze.. Cardiovascular Heart rhythm and rate regular, no murmur or gallop.. Pedal Pulses WNL. No clubbing, cyanosis or edema. Chest Breasts symmetical and no nipple discharge.. Breast tissue WNL, no masses, lumps, or tenderness.. Lymphatic No adneopathy. No adenopathy. No adenopathy. Musculoskeletal Adexa without tenderness or enlargement.. Digits and nails w/o clubbing, cyanosis, infection, petechiae, ischemia, or inflammatory conditions.. Integumentary (Hair, Skin) No suspicious lesions. No crepitus or fluctuance. No peri-wound warmth or erythema. No masses.. Notes the lymphedema is persistent and the ulceration need sharp debridement with a #3 curet and he is agreeable about this and this has been done minimal bleeding controlled with pressure Electronic Signature(s) Signed: 10/17/2016 9:23:23 AM By: Christin Fudge MD, FACS Entered By: Christin Fudge on 10/17/2016 09:23:23 Brad Taylor ([id number]) -------------------------------------------------------------------------------- Physician Orders Details Patient Name: Brad Taylor, Brad L. Date of Service: 10/17/2016 8:45 AM Medical Record Number: [id number] Patient Account Number: [id number] Date of Birth/Sex: Nov 04, 1950 (66 y.o. Male) Treating RN: Cornell Barman Primary Care Provider: Jenna Luo Other Clinician: Referring  Provider: Jenna Luo Treating Provider/Extender: Frann Rider in Treatment: 9 Verbal / Phone Orders: No Diagnosis Coding ICD-10 Coding Code Description E11.622 Type 2 diabetes mellitus with other skin ulcer L97.212 Non-pressure chronic ulcer of right calf with fat layer exposed I87.2 Venous insufficiency (chronic) (peripheral) F17.218 Nicotine dependence, cigarettes, with other nicotine-induced disorders Wound Cleansing Wound #1 Right,Lateral Lower Leg o Cleanse wound with mild soap and water - in clinic Anesthetic Wound #1 Right,Lateral Lower Leg o Topical Lidocaine 4% cream applied to wound bed prior to debridement Primary Wound Dressing Wound #1 Right,Lateral Lower Leg o Other: - hydrogel and sorbact Secondary Dressing Wound #1 Right,Lateral Lower Leg o ABD pad Dressing Change Frequency o Change dressing every week Follow-up Appointments Wound #1 Right,Lateral Lower Leg o Return Appointment in 1 week. o Nurse Visit as needed Edema Control Wound #1 Right,Lateral Lower Leg o 3 Layer Compression System - Right Lower Extremity o Elevate legs to the level of the heart and pump ankles as often as possible Brad Taylor, Brad ([id number]) Additional Orders / Instructions Wound #1 Right,Lateral Lower Leg o Stop Smoking o Increase protein intake. o Activity as tolerated o Other: - ZINC, VIT A, VIT C, MVI INCLUDE THESE IN YOU DIET Electronic Signature(s) Signed: 10/17/2016 4:33:40 PM By: Christin Fudge MD, FACS Signed: 10/17/2016 5:26:13 PM By: Gretta Cool, RN, BSN, Kim RN, BSN Entered By: Gretta Cool, RN, BSN, Kim on 10/17/2016 08:67:61 Brad Taylor ([id number]) -------------------------------------------------------------------------------- Problem List Details Patient Name: Brad Taylor, MAIN. Date of Service: 10/17/2016 8:45 AM Medical Record Number: [id number] Patient Account Number: [id number] Date of Birth/Sex: 1951-01-02 (66 y.o. Male) Treating  RN: Cornell Barman Primary Care Provider: Jenna Luo Other Clinician: Referring Provider: Jenna Luo Treating Provider/Extender: Frann Rider in Treatment: 9 Active Problems ICD-10 Encounter Code Description Active Date Diagnosis E11.622 Type 2 diabetes mellitus with other skin ulcer 08/15/2016 Yes L97.212 Non-pressure chronic ulcer of right calf with fat layer 08/15/2016 Yes exposed I87.2 Venous insufficiency (chronic) (peripheral) 08/15/2016 Yes F17.218 Nicotine dependence, cigarettes, with other  nicotine- 08/15/2016 Yes induced disorders I73.9 Peripheral vascular disease, unspecified 10/17/2016 Yes Inactive Problems Resolved Problems Electronic Signature(s) Signed: 10/17/2016 9:25:47 AM By: Christin Fudge MD, FACS Previous Signature: 10/17/2016 9:22:09 AM Version By: Christin Fudge MD, FACS Entered By: Christin Fudge on 10/17/2016 09:25:47 Brad Taylor ([id number]) -------------------------------------------------------------------------------- Progress Note Details Patient Name: Brad Taylor, Brad L. Date of Service: 10/17/2016 8:45 AM Medical Record Number: [id number] Patient Account Number: [id number] Date of Birth/Sex: 1951-07-08 (66 y.o. Male) Treating RN: Cornell Barman Primary Care Provider: Jenna Luo Other Clinician: Referring Provider: Jenna Luo Treating Provider/Extender: Frann Rider in Treatment: 9 Subjective Chief Complaint Information obtained from Patient Patient presents for treatment of an open ulcer due to venous insufficiency along with diabetes mellitus, to his right lower extremity which she's had for about 3 months History of Present Illness (HPI) The following HPI elements were documented for the patient's wound: Location: right lower extremity Quality: Patient reports experiencing a dull pain to affected area(s). Severity: Patient states wound are getting worse. Duration: Patient has had the wound for > 3 months prior to seeking  treatment at the wound center Timing: Pain in wound is Intermittent (comes and goes Context: The wound occurred when the patient had a lacerated wound which was treated with sutures which gaped and had an open ulceration Modifying Factors: Other treatment(s) tried include:been having on and Unna's boots applied by his PCP in the office Associated Signs and Symptoms: Patient reports having increase swelling. 66 year old gentleman with a past medical history of stroke, diabetes mellitus2 with hyperglycemia, CVA, hemiplegia, ahasia, cellulitis of the left gluteal area and is a smoker and smokes a packet of cigarettes a day. His past medical history also significant for COPD, thyroid disease and is status post cholecystectomy and status post knee surgery in October 2017 the patient had a laceration to his right lateral shin and had a deep tear which was sutured with 4-0 Ethilon sutures but these opened out because of his chronic venous insufficiency and severe edema and the patient was seen by his PCP and put on Bactrim. He was also advised to do dressings with Silvadene and Unna's boots. We don't have notes from the [facility] in [place] but he says he's had a thorough workup done both arterial and venous and was told that he has venous insufficiency and was to wear compression stockings of 30-40 mm variety. We'll try and obtain these notes. He also gives a history of a lung mass which was a cancer and treated with resection but no chemotherapy or radiation. 08/29/2016 -- not yet received any of his notes from the [facility] 09/05/2016 -- in spite of every effort on the patient's part and are part we have not been able to get any reports regarding his arterial and venous workup. At this stage I'm going to request fresh arterial duplex studies and venous reflux studies. 09/12/2016 -- the patient has not yet got his arterial and venous duplex study reports and refuses to do these tests again. He  says he will get the reports back in about 2 weeks' time. He also says he has had a Brad Taylor, PINEDA. ([id number]) lot of pain and this lower extremity using Hydrofera Blue and asks to change the dressing material. 09/19/2016 -- patient's wife did talk to the doctor at the [place] and they will send as the old report of arterial and venous duplex studies and if need be they will repeat it again. Rash request will be sent by [place]. 09/26/2016 --  my office has sent the requests for arterial venous duplex studies to the [facility] and they have received it and the patient is waiting back for an appointment. October 03 2016 -- Notes reviewed from the [facility] were reviewed and in June 2016 he was scheduled to have a right upper lobe lung surgery by [facility] and I presume this was done as planned. A note from June 2017 stated that he was status post right upper lobectomy with mediastinal lymphadenectomy for a T2 N0 MX well-differentiated squamous cell carcinoma. After the multidisciplinary oncology conference he was recommended surveillance protocol. We also received some other notes which the patient has brought in today form November 2013, he had normal ABIs on 06/11/2012. Right was 0.9 to left was 1.12. Toe brachial index was 0.78 on the right and left was 0.67. He had a diagnosis of venous insufficiency and at that stage Unnas boots were applied. There are no recent notes from either arterial or venous duplex studies done since then. In discussing the previous workup and discussing the fact that he required a arterial duplex and a venous reflux study to be done, the patient suddenly started showing signs of being aggressive and rude and was being very aggressive in his conversation about what he wanted to and what he did not want to do. At this stage I had to firmly tell him to set up boundaries and not talk rudedly to either my staff or me and to not to threaten [place] with physical abuse. He calmed down for a  while and agreed to get his workup done. 10/17/2016 -- he had an ABI examination of both lower extremities done at the [facility] and his right ABI was 0.80 and the left was 0.99. his venous duplex for reflux is to be done on March 26 Objective Constitutional Pulse regular. Respirations normal and unlabored. Afebrile. Vitals Time Taken: 8:48 AM, Height: 72 in, Weight: 250 lbs, BMI: 33.9, Temperature: 97.7 F, Pulse: 55 bpm, Respiratory Rate: 18 breaths/min, Blood Pressure: 139/73 mmHg. Eyes Nonicteric. Reactive to light. Ears, Nose, Mouth, and Throat Lips, teeth, and gums WNL.Marland Kitchen Moist mucosa without lesions. Neck supple and nontender. No palpable supraclavicular or cervical adenopathy. Normal sized without goiter. Respiratory Dunlap, De[place]ova ([id number]) WNL. No retractions.. Breath sounds WNL, No rubs, rales, rhonchi, or wheeze.. Cardiovascular Heart rhythm and rate regular, no murmur or gallop.. Pedal Pulses WNL. No clubbing, cyanosis or edema. Chest Breasts symmetical and no nipple discharge.. Breast tissue WNL, no masses, lumps, or tenderness.. Lymphatic No adneopathy. No adenopathy. No adenopathy. Musculoskeletal Adexa without tenderness or enlargement.. Digits and nails w/o clubbing, cyanosis, infection, petechiae, ischemia, or inflammatory conditions.. General Notes: the lymphedema is persistent and the ulceration need sharp debridement with a #3 curet and he is agreeable about this and this has been done minimal bleeding controlled with pressure Integumentary (Hair, Skin) No suspicious lesions. No crepitus or fluctuance. No peri-wound warmth or erythema. No masses.. Wound #1 status is Open. Original cause of wound was Trauma. The wound is located on the Right,Lateral Lower Leg. The wound measures 5.4cm length x 2cm width x 0.1cm depth; 8.482cm^2 area and 0.848cm^3 volume. There is Fat Layer (Subcutaneous Tissue) Exposed exposed. There is no tunneling  or undermining noted. There is a large amount of serosanguineous drainage noted. The wound margin is flat and intact. There is medium (34-66%) pink, pale granulation within the wound bed. There is a small (1-33%) amount of necrotic tissue within the wound bed  including [place]. The periwound skin appearance exhibited: Excoriation, Scarring, Maceration, Hemosiderin Staining, Mottled. The periwound skin appearance did not exhibit: Callus, Crepitus, Induration, Rash, Dry/Scaly, Atrophie Blanche, Cyanosis, Ecchymosis, Pallor, Rubor, Erythema. Periwound temperature was noted as No Abnormality. The periwound has tenderness on palpation. Assessment Active Problems ICD-10 E11.622 - Type 2 diabetes mellitus with other skin ulcer L97.212 - Non-pressure chronic ulcer of right calf with fat layer exposed I87.2 - Venous insufficiency (chronic) (peripheral) F17.218 - Nicotine dependence, cigarettes, with other nicotine-induced disorders I73.9 - Peripheral vascular disease, unspecified Brad Taylor, Brad L. ([id number]) having reviewed his arterial duplex study at this stage I have recommended: 1. Hydrogel with Sorbact with a 3 layer Profore 2. Good control of his diabetes mellitus and I have asked him to see his PCP for further care 3. elevation and exercise 4. Adequate protein, vitamin A, vitamin C and zinc 5. Regular visits to the wound center 6. fresh requests for venous duplex studies are again given. he and his wife have read all questions answered and will be compliant Procedures Wound #1 Wound #1 is a Diabetic Wound/Ulcer of the Lower Extremity located on the Right,Lateral Lower Leg . There was a Skin/Subcutaneous Tissue Debridement ([id number]) debridement with total area of 10.8 sq cm performed by Christin Fudge, MD. with the following instrument(s): Curette to remove Viable and Non-Viable tissue/material including Fibrin/Slough and Subcutaneous after achieving pain control using Other  (lidocaine 4%). A time out was conducted at 09:08, prior to the start of the procedure. A Minimum amount of bleeding was controlled with Pressure. The patient tolerated the procedure with a pain level of 0 throughout and a pain level of 0 following the procedure. Post Debridement Measurements: 5.4cm length x 2cm width x 0.1cm depth; 0.848cm^3 volume. Character of Wound/Ulcer Post Debridement requires further debridement. Severity of Tissue Post Debridement is: Fat layer exposed. Post procedure Diagnosis Wound #1: Same as Pre-Procedure Plan having reviewed his arterial duplex study at this stage I have recommended: 1. Hydrogel with Sorbact with a 3 layer Profore 2. Good control of his diabetes mellitus and I have asked him to see his PCP for further care 3. elevation and exercise 4. Adequate protein, vitamin A, vitamin C and zinc 5. Regular visits to the wound center 6. fresh requests for venous duplex studies are again given. he and his wife have read all questions answered and will be compliant Brad Taylor, Brad Taylor ([id number]) Electronic Signature(s) Signed: 10/17/2016 9:26:03 AM By: Christin Fudge MD, FACS Previous Signature: 10/17/2016 9:25:03 AM Version By: Christin Fudge MD, FACS Entered By: Christin Fudge on 10/17/2016 09:26:03 Brad Taylor ([id number]) -------------------------------------------------------------------------------- SuperBill Details Patient Name: Brad Taylor, VORA. Date of Service: 10/17/2016 Medical Record Number: [id number] Patient Account Number: [id number] Date of Birth/Sex: 03-Mar-1951 (66 y.o. Male) Treating RN: Cornell Barman Primary Care Provider: Jenna Luo Other Clinician: Referring Provider: Jenna Luo Treating Provider/Extender: Christin Fudge Service Line: Outpatient Weeks in Treatment: 9 Diagnosis Coding ICD-10 Codes Code Description E11.622 Type 2 diabetes mellitus with other skin ulcer L97.212 Non-pressure chronic ulcer of right calf with  fat layer exposed I87.2 Venous insufficiency (chronic) (peripheral) F17.218 Nicotine dependence, cigarettes, with other nicotine-induced disorders Facility Procedures CPT4 Code: [id number] Description: 47096 - DEB SUBQ TISSUE 20 SQ CM/< ICD-10 Description Diagnosis E11.622 Type 2 diabetes mellitus with other skin ulcer L97.212 Non-pressure chronic ulcer of right calf with fat I87.2 Venous insufficiency (chronic) (peripheral) Modifier: layer exposed Quantity: 1 Physician Procedures CPT4 Code Description: [id number] - WC PHYS LEVEL 3 - EST PT  ICD-10 Description Diagnosis E11.622 Type 2 diabetes mellitus with other skin ulcer L97.212 Non-pressure chronic ulcer of right calf with fat l I87.2 Venous insufficiency (chronic)  (peripheral) F17.218 Nicotine dependence, cigarettes, with other nicotin Modifier: 25 ayer exposed e-induced disord Quantity: 1 ers CPT4 Code Description: [id number] 11042 - WC PHYS SUBQ TISS 20 SQ CM ICD-10 Description Diagnosis E11.622 Type 2 diabetes mellitus with other skin ulcer L97.212 Non-pressure chronic ulcer of right calf with fat l I87.2 Venous insufficiency (chronic)  (peripheral) Brad Taylor, MOLYNEUX. ([id number]) Modifier: ayer exposed Quantity: 1 Electronic Signature(s) Signed: 10/17/2016 9:25:28 AM By: Christin Fudge MD, FACS Entered By: Christin Fudge on 10/17/2016 41:28:78

## 2016-10-21 ENCOUNTER — Telehealth: Payer: Self-pay | Admitting: Family Medicine

## 2016-10-21 MED ORDER — "PEN NEEDLES 5/16"" 30G X 8 MM MISC": 5 refills | Status: DC

## 2016-10-21 NOTE — Telephone Encounter (Signed)
rx sent to pharm

## 2016-10-21 NOTE — Telephone Encounter (Signed)
Patient calling to say he is going to be out of his diabetic needles tonight if not called into the [facility] on [address] today

## 2016-10-24 ENCOUNTER — Encounter: Payer: Medicare HMO | Admitting: Surgery

## 2016-10-24 DIAGNOSIS — F17218 Nicotine dependence, cigarettes, with other nicotine-induced disorders: Secondary | ICD-10-CM | POA: Diagnosis not present

## 2016-10-24 DIAGNOSIS — J449 Chronic obstructive pulmonary disease, unspecified: Secondary | ICD-10-CM | POA: Diagnosis not present

## 2016-10-24 DIAGNOSIS — E039 Hypothyroidism, unspecified: Secondary | ICD-10-CM | POA: Diagnosis not present

## 2016-10-24 DIAGNOSIS — E11622 Type 2 diabetes mellitus with other skin ulcer: Secondary | ICD-10-CM | POA: Diagnosis not present

## 2016-10-24 DIAGNOSIS — L97919 Non-pressure chronic ulcer of unspecified part of right lower leg with unspecified severity: Secondary | ICD-10-CM | POA: Diagnosis not present

## 2016-10-24 DIAGNOSIS — I872 Venous insufficiency (chronic) (peripheral): Secondary | ICD-10-CM | POA: Diagnosis not present

## 2016-10-24 DIAGNOSIS — I890 Lymphedema, not elsewhere classified: Secondary | ICD-10-CM | POA: Diagnosis not present

## 2016-10-24 DIAGNOSIS — I252 Old myocardial infarction: Secondary | ICD-10-CM | POA: Diagnosis not present

## 2016-10-24 DIAGNOSIS — Z8673 Personal history of transient ischemic attack (TIA), and cerebral infarction without residual deficits: Secondary | ICD-10-CM | POA: Diagnosis not present

## 2016-10-24 DIAGNOSIS — L97212 Non-pressure chronic ulcer of right calf with fat layer exposed: Secondary | ICD-10-CM | POA: Diagnosis not present

## 2016-10-24 DIAGNOSIS — I10 Essential (primary) hypertension: Secondary | ICD-10-CM | POA: Diagnosis not present

## 2016-10-25 NOTE — Progress Notes (Signed)
GREOGRY, GOODWYN ([id number]) Visit Report for 10/24/2016 Chief Complaint Document Details Patient Name: Brad Taylor, Brad Taylor. Date of Service: 10/24/2016 8:45 AM Medical Record Number: [id number] Patient Account Number: [id number] Date of Birth/Sex: 1951/06/19 (66 y.o. Male) Treating RN: Ahmed Prima Primary Care Provider: Jenna Luo Other Clinician: Referring Provider: Jenna Luo Treating Provider/Extender: Frann Rider in Treatment: 10 Information Obtained from: Patient Chief Complaint Patient presents for treatment of an open ulcer due to venous insufficiency along with diabetes mellitus, to his right lower extremity which she's had for about 3 months Electronic Signature(s) Signed: 10/24/2016 9:21:37 AM By: Christin Fudge MD, FACS Entered By: Christin Fudge on 10/24/2016 09:21:37 Brad Taylor ([id number]) -------------------------------------------------------------------------------- HPI Details Patient Name: Brad Taylor. Date of Service: 10/24/2016 8:45 AM Medical Record Number: [id number] Patient Account Number: [id number] Date of Birth/Sex: 1950-08-22 (66 y.o. Male) Treating RN: Ahmed Prima Primary Care Provider: Jenna Luo Other Clinician: Referring Provider: Jenna Luo Treating Provider/Extender: Frann Rider in Treatment: 10 History of Present Illness Location: right lower extremity Quality: Patient reports experiencing a dull pain to affected area(s). Severity: Patient states wound are getting worse. Duration: Patient has had the wound for > 3 months prior to seeking treatment at the wound center Timing: Pain in wound is Intermittent (comes and goes Context: The wound occurred when the patient had a lacerated wound which was treated with sutures which gaped and had an open ulceration Modifying Factors: Other treatment(s) tried include:been having on and Unna's boots applied by his PCP in the office Associated Signs and  Symptoms: Patient reports having increase swelling. HPI Description: 66 year old gentleman with a past medical history of stroke, diabetes mellitus2 with hyperglycemia, CVA, hemiplegia, ahasia, cellulitis of the left gluteal area and is a smoker and smokes a packet of cigarettes a day. His past medical history also significant for COPD, thyroid disease and is status post cholecystectomy and status post knee surgery in October 2017 the patient had a laceration to his right lateral shin and had a deep tear which was sutured with 4-0 Ethilon sutures but these opened out because of his chronic venous insufficiency and severe edema and the patient was seen by his PCP and put on Bactrim. He was also advised to do dressings with Silvadene and Unna's boots. We don't have notes from the [facility] in [place] but he says he's had a thorough workup done both arterial and venous and was told that he has venous insufficiency and was to wear compression stockings of 30-40 mm variety. We'll try and obtain these notes. He also gives a history of a lung mass which was a cancer and treated with resection but no chemotherapy or radiation. 08/29/2016 -- not yet received any of his notes from the [facility] 09/05/2016 -- in spite of every effort on the patient's part and are part we have not been able to get any reports regarding his arterial and venous workup. At this stage I'm going to request fresh arterial duplex studies and venous reflux studies. 09/12/2016 -- the patient has not yet got his arterial and venous duplex study reports and refuses to do these tests again. He says he will get the reports back in about 2 weeks' time. He also says he has had a lot of pain and this lower extremity using Hydrofera Blue and asks to change the dressing material. 09/19/2016 -- patient's wife did talk to the doctor at the [place] and they will send as the old report of arterial and venous duplex  studies and if need be they  will repeat it again. Rash request will be sent by [place]. 09/26/2016 -- my office has sent the requests for arterial venous duplex studies to the [facility] and they have received it and the patient is waiting back for an appointment. October 03 2016 -- Notes reviewed from the [facility] were reviewed and in June 2016 he was scheduled to have a right upper lobe lung surgery by [facility] and I presume this was done as planned. A note from June 2017 stated that he was status post right upper lobectomy with mediastinal lymphadenectomy for a T2 Brad Taylor, Brad L. ([id number]) N0 MX well-differentiated squamous cell carcinoma. After the multidisciplinary oncology conference he was recommended surveillance protocol. We also received some other notes which the patient has brought in today form November 2013, he had normal ABIs on 06/11/2012. Right was 0.9 to left was 1.12. Toe brachial index was 0.78 on the right and left was 0.67. He had a diagnosis of venous insufficiency and at that stage Unnas boots were applied. There are no recent notes from either arterial or venous duplex studies done since then. In discussing the previous workup and discussing the fact that he required a arterial duplex and a venous reflux study to be done, the patient suddenly started showing signs of being aggressive and rude and was being very aggressive in his conversation about what he wanted to and what he did not want to do. At this stage I had to firmly tell him to set up boundaries and not talk rudedly to either my staff or me and to not to threaten [place] with physical abuse. He calmed down for a while and agreed to get his workup done. 10/17/2016 -- he had an ABI examination of both lower extremities done at the [facility] and his right ABI was 0.80 and the left was 0.99. his venous duplex for reflux is to be done on March 26 10/24/2016 -- the patient says he did not tolerate the 3 layer compression and removed it  and what his own compression stockings. His venous duplex study for reflux is scheduled for next Monday Electronic Signature(s) Signed: 10/24/2016 9:22:12 AM By: Christin Fudge MD, FACS Entered By: Christin Fudge on 10/24/2016 09:22:12 Brad Taylor, Brad Taylor ([id number]) -------------------------------------------------------------------------------- Physical Exam Details Patient Name: Brad Taylor, Brad L. Date of Service: 10/24/2016 8:45 AM Medical Record Number: [id number] Patient Account Number: [id number] Date of Birth/Sex: Jun 14, 1951 (66 y.o. Male) Treating RN: Ahmed Prima Primary Care Provider: Jenna Luo Other Clinician: Referring Provider: Jenna Luo Treating Provider/Extender: Frann Rider in Treatment: 10 Constitutional . Pulse regular. Respirations normal and unlabored. Afebrile. . Eyes Nonicteric. Reactive to light. Ears, Nose, Mouth, and Throat Lips, teeth, and gums WNL.Marland Kitchen Moist mucosa without lesions. Neck supple and nontender. No palpable supraclavicular or cervical adenopathy. Normal sized without goiter. Respiratory WNL. No retractions.. Breath sounds WNL, No rubs, rales, rhonchi, or wheeze.. Cardiovascular Heart rhythm and rate regular, no murmur or gallop.. Pedal Pulses WNL. No clubbing, cyanosis or edema. Chest Breasts symmetical and no nipple discharge.. Breast tissue WNL, no masses, lumps, or tenderness.. Lymphatic No adneopathy. No adenopathy. No adenopathy. Musculoskeletal Adexa without tenderness or enlargement.. Digits and nails w/o clubbing, cyanosis, infection, petechiae, ischemia, or inflammatory conditions.. Integumentary (Hair, Skin) No suspicious lesions. No crepitus or fluctuance. No peri-wound warmth or erythema. No masses.Marland Kitchen Psychiatric Judgement and insight Intact.. No evidence of depression, anxiety, or agitation.. Notes the lymphedema still persist and the patient will not  allow a 3 layer compression to be applied and wants to wear  his own compression stockings. The wound itself did not need sharp debridement today. Electronic Signature(s) Signed: 10/24/2016 9:22:46 AM By: Christin Fudge MD, FACS Entered By: Christin Fudge on 10/24/2016 09:22:45 Brad Taylor ([id number]) -------------------------------------------------------------------------------- Physician Orders Details Patient Name: Brad Taylor, KASSA. Date of Service: 10/24/2016 8:45 AM Medical Record Number: [id number] Patient Account Number: [id number] Date of Birth/Sex: 1951/06/14 (66 y.o. Male) Treating RN: Ahmed Prima Primary Care Provider: Jenna Luo Other Clinician: Referring Provider: Jenna Luo Treating Provider/Extender: Frann Rider in Treatment: 10 Verbal / Phone Orders: Yes Clinician: Carolyne Fiscal, Debi Read Back and Verified: Yes Diagnosis Coding Wound Cleansing Wound #1 Right,Lateral Lower Leg o Cleanse wound with mild soap and water - in clinic Anesthetic Wound #1 Right,Lateral Lower Leg o Topical Lidocaine 4% cream applied to wound bed prior to debridement Skin Barriers/Peri-Wound Care Wound #1 Right,Lateral Lower Leg o Skin Prep Primary Wound Dressing Wound #1 Right,Lateral Lower Leg o Other: - hydrogel and sorbact Secondary Dressing Wound #1 Right,Lateral Lower Leg o Dry Gauze o Boardered Foam Dressing Dressing Change Frequency o Change dressing every other day. Follow-up Appointments Wound #1 Right,Lateral Lower Leg o Return Appointment in 1 week. Edema Control Wound #1 Right,Lateral Lower Leg o Elevate legs to the level of the heart and pump ankles as often as possible Additional Orders / Instructions Wound #1 Right,Lateral Lower Leg o Stop Smoking Blankenship, Gurinder L. ([id number]) o Increase protein intake. o Activity as tolerated o Other: - ZINC, VIT A, VIT C, MVI INCLUDE THESE IN YOU DIET Electronic Signature(s) Signed: 10/24/2016 4:00:38 PM By: Christin Fudge MD, FACS Entered  By: Christin Fudge on 10/24/2016 09:22:59 Brad Taylor ([id number]) -------------------------------------------------------------------------------- Problem List Details Patient Name: Brad Taylor, Brad L. Date of Service: 10/24/2016 8:45 AM Medical Record Number: [id number] Patient Account Number: [id number] Date of Birth/Sex: [birth year] (66 y.o. Male) Treating RN: Ahmed Prima Primary Care Provider: Jenna Luo Other Clinician: Referring Provider: Jenna Luo Treating Provider/Extender: Frann Rider in Treatment: 10 Active Problems ICD-10 Encounter Code Description Active Date Diagnosis E11.622 Type 2 diabetes mellitus with other skin ulcer 08/15/2016 Yes L97.212 Non-pressure chronic ulcer of right calf with fat layer 08/15/2016 Yes exposed I87.2 Venous insufficiency (chronic) (peripheral) 08/15/2016 Yes F17.218 Nicotine dependence, cigarettes, with other nicotine- 08/15/2016 Yes induced disorders I73.9 Peripheral vascular disease, unspecified 10/17/2016 Yes Inactive Problems Resolved Problems Electronic Signature(s) Signed: 10/24/2016 9:21:23 AM By: Christin Fudge MD, FACS Entered By: Christin Fudge on 10/24/2016 09:21:23 Brad Taylor ([id number]) -------------------------------------------------------------------------------- Progress Note Details Patient Name: Brad Taylor. Date of Service: 10/24/2016 8:45 AM Medical Record Number: [id number] Patient Account Number: [id number] Date of Birth/Sex: March 22, 1951 (66 y.o. Male) Treating RN: Ahmed Prima Primary Care Provider: Jenna Luo Other Clinician: Referring Provider: Jenna Luo Treating Provider/Extender: Frann Rider in Treatment: 10 Subjective Chief Complaint Information obtained from Patient Patient presents for treatment of an open ulcer due to venous insufficiency along with diabetes mellitus, to his right lower extremity which she's had for about 3 months History of Present  Illness (HPI) The following HPI elements were documented for the patient's wound: Location: right lower extremity Quality: Patient reports experiencing a dull pain to affected area(s). Severity: Patient states wound are getting worse. Duration: Patient has had the wound for > 3 months prior to seeking treatment at the wound center Timing: Pain in wound is Intermittent (comes and goes Context: The wound occurred when the patient had a lacerated wound which was treated  with sutures which gaped and had an open ulceration Modifying Factors: Other treatment(s) tried include:been having on and Unna's boots applied by his PCP in the office Associated Signs and Symptoms: Patient reports having increase swelling. 66 year old gentleman with a past medical history of stroke, diabetes mellitus2 with hyperglycemia, CVA, hemiplegia, ahasia, cellulitis of the left gluteal area and is a smoker and smokes a packet of cigarettes a day. His past medical history also significant for COPD, thyroid disease and is status post cholecystectomy and status post knee surgery in October 2017 the patient had a laceration to his right lateral shin and had a deep tear which was sutured with 4-0 Ethilon sutures but these opened out because of his chronic venous insufficiency and severe edema and the patient was seen by his PCP and put on Bactrim. He was also advised to do dressings with Silvadene and Unna's boots. We don't have notes from the [facility] in [place] but he says he's had a thorough workup done both arterial and venous and was told that he has venous insufficiency and was to wear compression stockings of 30-40 mm variety. We'll try and obtain these notes. He also gives a history of a lung mass which was a cancer and treated with resection but no chemotherapy or radiation. 08/29/2016 -- not yet received any of his notes from the [facility] 09/05/2016 -- in spite of every effort on the patient's part and are  part we have not been able to get any reports regarding his arterial and venous workup. At this stage I'm going to request fresh arterial duplex studies and venous reflux studies. 09/12/2016 -- the patient has not yet got his arterial and venous duplex study reports and refuses to do these tests again. He says he will get the reports back in about 2 weeks' time. He also says he has had a Brad Taylor, CORTI. ([id number]) lot of pain and this lower extremity using Hydrofera Blue and asks to change the dressing material. 09/19/2016 -- patient's wife did talk to the doctor at the [place] and they will send as the old report of arterial and venous duplex studies and if need be they will repeat it again. Rash request will be sent by [place]. 09/26/2016 -- my office has sent the requests for arterial venous duplex studies to the [facility] and they have received it and the patient is waiting back for an appointment. October 03 2016 -- Notes reviewed from the [facility] were reviewed and in June 2016 he was scheduled to have a right upper lobe lung surgery by [facility] and I presume this was done as planned. A note from June 2017 stated that he was status post right upper lobectomy with mediastinal lymphadenectomy for a T2 N0 MX well-differentiated squamous cell carcinoma. After the multidisciplinary oncology conference he was recommended surveillance protocol. We also received some other notes which the patient has brought in today form November 2013, he had normal ABIs on 06/11/2012. Right was 0.9 to left was 1.12. Toe brachial index was 0.78 on the right and left was 0.67. He had a diagnosis of venous insufficiency and at that stage Unnas boots were applied. There are no recent notes from either arterial or venous duplex studies done since then. In discussing the previous workup and discussing the fact that he required a arterial duplex and a venous reflux study to be done, the patient suddenly started showing signs of  being aggressive and rude and was being very aggressive in  his conversation about what he wanted to and what he did not want to do. At this stage I had to firmly tell him to set up boundaries and not talk rudedly to either my staff or me and to not to threaten [place] with physical abuse. He calmed down for a while and agreed to get his workup done. 10/17/2016 -- he had an ABI examination of both lower extremities done at the [facility] and his right ABI was 0.80 and the left was 0.99. his venous duplex for reflux is to be done on March 26 10/24/2016 -- the patient says he did not tolerate the 3 layer compression and removed it and what his own compression stockings. His venous duplex study for reflux is scheduled for next Monday Objective Constitutional Pulse regular. Respirations normal and unlabored. Afebrile. Vitals Time Taken: 8:56 AM, Height: 72 in, Weight: 250 lbs, BMI: 33.9, Temperature: 97.8 F, Pulse: 52 bpm, Respiratory Rate: 18 breaths/min, Blood Pressure: 139/76 mmHg. Eyes Nonicteric. Reactive to light. Ears, Nose, Mouth, and Throat Lips, teeth, and gums WNL.Marland Kitchen Moist mucosa without lesions. Neck supple and nontender. No palpable supraclavicular or cervical adenopathy. Normal sized without goiter. Brad Taylor, Brad Taylor ([id number]) Respiratory WNL. No retractions.. Breath sounds WNL, No rubs, rales, rhonchi, or wheeze.. Cardiovascular Heart rhythm and rate regular, no murmur or gallop.. Pedal Pulses WNL. No clubbing, cyanosis or edema. Chest Breasts symmetical and no nipple discharge.. Breast tissue WNL, no masses, lumps, or tenderness.. Lymphatic No adneopathy. No adenopathy. No adenopathy. Musculoskeletal Adexa without tenderness or enlargement.. Digits and nails w/o clubbing, cyanosis, infection, petechiae, ischemia, or inflammatory conditions.Marland Kitchen Psychiatric Judgement and insight Intact.. No evidence of depression, anxiety, or agitation.. General Notes: the  lymphedema still persist and the patient will not allow a 3 layer compression to be applied and wants to wear his own compression stockings. The wound itself did not need sharp debridement today. Integumentary (Hair, Skin) No suspicious lesions. No crepitus or fluctuance. No peri-wound warmth or erythema. No masses.. Wound #1 status is Open. Original cause of wound was Trauma. The wound is located on the Right,Lateral Lower Leg. The wound measures 5.3cm length x 1.3cm width x 0.1cm depth; 5.411cm^2 area and 0.541cm^3 volume. There is Fat Layer (Subcutaneous Tissue) Exposed exposed. There is no tunneling or undermining noted. There is a large amount of serosanguineous drainage noted. The wound margin is flat and intact. There is large (67-100%) pink, pale granulation within the wound bed. There is a small (1-33%) amount of necrotic tissue within the wound bed including Adherent Slough. The periwound skin appearance exhibited: Excoriation, Scarring, Maceration, Hemosiderin Staining, Mottled. The periwound skin appearance did not exhibit: Callus, Crepitus, Induration, Rash, Dry/Scaly, Atrophie Blanche, Cyanosis, Ecchymosis, Pallor, Rubor, Erythema. Periwound temperature was noted as No Abnormality. The periwound has tenderness on palpation. Assessment Active Problems ICD-10 E11.622 - Type 2 diabetes mellitus with other skin ulcer L97.212 - Non-pressure chronic ulcer of right calf with fat layer exposed I87.2 - Venous insufficiency (chronic) (peripheral) Brad Taylor, WELCH. ([id number]) F17.218 - Nicotine dependence, cigarettes, with other nicotine-induced disorders I73.9 - Peripheral vascular disease, unspecified Plan Wound Cleansing: Wound #1 Right,Lateral Lower Leg: Cleanse wound with mild soap and water - in clinic Anesthetic: Wound #1 Right,Lateral Lower Leg: Topical Lidocaine 4% cream applied to wound bed prior to debridement Skin Barriers/Peri-Wound Care: Wound #1 Right,Lateral Lower  Leg: Skin Prep Primary Wound Dressing: Wound #1 Right,Lateral Lower Leg: Other: - hydrogel and sorbact Secondary Dressing: Wound #1 Right,Lateral Lower Leg: Dry Gauze  Boardered Foam Dressing Dressing Change Frequency: Change dressing every other day. Follow-up Appointments: Wound #1 Right,Lateral Lower Leg: Return Appointment in 1 week. Edema Control: Wound #1 Right,Lateral Lower Leg: Elevate legs to the level of the heart and pump ankles as often as possible Additional Orders / Instructions: Wound #1 Right,Lateral Lower Leg: Stop Smoking Increase protein intake. Activity as tolerated Other: - ZINC, VIT A, VIT C, MVI INCLUDE THESE IN YOU DIET the patient's wife describes lymphedema pumps available to him at home which were given by the [place] 2 years ago and he has not been using them. I have not seen these physically but from what she describes he was asked to use them twice a day for an hour each and I have no objections if they continue to do this. Brad Taylor, Brad Taylor ([id number]) I have recommended: 1. Hydrogel with Sorbact with a 3 layer Profore -- The patient is not compliant with wearing this and wants to read his own compression stockings. 2. Good control of his diabetes mellitus and I have asked him to see his PCP for further care 3. elevation and exercise 4. Adequate protein, vitamin A, vitamin C and zinc 5. Regular visits to the wound center 6. fresh requests for venous duplex studies are again given -- appointment scheduled next Monday. he and his wife have read all questions answered and will be compliant Electronic Signature(s) Signed: 10/24/2016 9:24:47 AM By: Christin Fudge MD, FACS Entered By: Christin Fudge on 10/24/2016 09:24:46 Brad Taylor ([id number]) -------------------------------------------------------------------------------- SuperBill Details Patient Name: Brad Taylor. Date of Service: 10/24/2016 Medical Record Number: [id number] Patient Account  Number: [id number] Date of Birth/Sex: [birth year] (66 y.o. Male) Treating RN: Ahmed Prima Primary Care Provider: Jenna Luo Other Clinician: Referring Provider: Jenna Luo Treating Provider/Extender: Christin Fudge Service Line: Outpatient Weeks in Treatment: 10 Diagnosis Coding ICD-10 Codes Code Description E11.622 Type 2 diabetes mellitus with other skin ulcer L97.212 Non-pressure chronic ulcer of right calf with fat layer exposed I87.2 Venous insufficiency (chronic) (peripheral) F17.218 Nicotine dependence, cigarettes, with other nicotine-induced disorders I73.9 Peripheral vascular disease, unspecified Facility Procedures CPT4 Code: 30940768 Description: 99213 - WOUND CARE VISIT-LEV 3 EST PT Modifier: Quantity: 1 Physician Procedures CPT4 Code: 0881103 Description: 15945 - WC PHYS LEVEL 3 - EST PT ICD-10 Description Diagnosis E11.622 Type 2 diabetes mellitus with other skin ulcer L97.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

## 2016-10-26 NOTE — Progress Notes (Signed)
NEWMAN, WAREN ([id number]) Visit Report for 10/24/2016 Arrival Information Details Patient Name: MATHIUS, BIRKELAND. Date of Service: 10/24/2016 8:45 AM Medical Record Number: [id number] Patient Account Number: [id number] Date of Birth/Sex: October 18, 1950 (66 y.o. Male) Treating RN: Ahmed Prima Primary Care Lynnwood Beckford: Other Clinician: Referring Ayumi Wangerin: Yehuda Savannah Treating Brockton Mckesson/Extender: Frann Rider in Treatment: 10 Visit Information History Since Last Visit All ordered tests and consults were completed: No Patient Arrived: Ambulatory Added or deleted any medications: No Arrival Time: 08:51 Any new allergies or adverse reactions: No Accompanied By: wife Had a fall or experienced change in No Transfer Assistance: None activities of daily living that may affect Patient Identification Verified: Yes risk of falls: Secondary Verification Process Yes Signs or symptoms of abuse/neglect since last No Completed: visito Patient Requires Transmission- No Hospitalized since last visit: No Based Precautions: Has Dressing in Place as Prescribed: Yes Patient Has Alerts: Yes Pain Present Now: No Patient Alerts: Hgb A1c 8.4 10/10/2016 [facility] ABI: L) 0.99 R) 0.80 Electronic Signature(s) Signed: 10/25/2016 4:34:11 PM By: Alric Quan Entered By: Alric Quan on 10/24/2016 08:55:20 Coulthard, Eric Form ([id number]) -------------------------------------------------------------------------------- Clinic Level of Care Assessment Details Patient Name: [name]. Date of Service: 10/24/2016 8:45 AM Medical Record Number: [id number] Patient Account Number: [id number] Date of Birth/Sex: 1951/07/26 (66 y.o. Male) Treating RN: Ahmed Prima Primary Care Marlyss Cissell: Other Clinician: Referring Jereld Presti: Yehuda Savannah Treating Orel Cooler/Extender: Frann Rider in Treatment: 10 Clinic Level of Care Assessment Items TOOL 4 Quantity Score X - Use when only an EandM  is performed on FOLLOW-UP visit 1 0 ASSESSMENTS - Nursing Assessment / Reassessment X - Reassessment of Co-morbidities (includes updates in patient status) 1 10 X - Reassessment of Adherence to Treatment Plan 1 5 ASSESSMENTS - Wound and Skin Assessment / Reassessment X - Simple Wound Assessment / Reassessment - one wound 1 5 []  - Complex Wound Assessment / Reassessment - multiple wounds 0 []  - Dermatologic / Skin Assessment (not related to wound area) 0 ASSESSMENTS - Focused Assessment X - Circumferential Edema Measurements - multi extremities 1 5 []  - Nutritional Assessment / Counseling / Intervention 0 []  - Lower Extremity Assessment (monofilament, tuning fork, pulses) 0 []  - Peripheral Arterial Disease Assessment (using hand held doppler) 0 ASSESSMENTS - Ostomy and/or Continence Assessment and Care []  - Incontinence Assessment and Management 0 []  - Ostomy Care Assessment and Management (repouching, etc.) 0 PROCESS - Coordination of Care X - Simple Patient / Family Education for ongoing care 1 15 []  - Complex (extensive) Patient / Family Education for ongoing care 0 []  - Staff obtains Programmer, systems, Records, Test Results / Process Orders 0 []  - Staff telephones HHA, Nursing Homes / Clarify orders / etc 0 []  - Routine Transfer to another Facility (non-emergent condition) 0 GURVEER, COLUCCI ([id number]) []  - Routine Hospital Admission (non-emergent condition) 0 []  - New Admissions / Biomedical engineer / Ordering NPWT, Apligraf, etc. 0 []  - Emergency Hospital Admission (emergent condition) 0 X - Simple Discharge Coordination 1 10 []  - Complex (extensive) Discharge Coordination 0 PROCESS - Special Needs []  - Pediatric / Minor Patient Management 0 []  - Isolation Patient Management 0 []  - Hearing / Language / Visual special needs 0 []  - Assessment of Community assistance (transportation, D/C planning, etc.) 0 []  - Additional assistance / Altered mentation 0 []  - Support Surface(s)  Assessment (bed, cushion, seat, etc.) 0 INTERVENTIONS - Wound Cleansing / Measurement X - Simple Wound Cleansing - one wound 1 5 []  - Complex  Wound Cleansing - multiple wounds 0 X - Wound Imaging (photographs - any number of wounds) 1 5 []  - Wound Tracing (instead of photographs) 0 X - Simple Wound Measurement - one wound 1 5 []  - Complex Wound Measurement - multiple wounds 0 INTERVENTIONS - Wound Dressings []  - Small Wound Dressing one or multiple wounds 0 X - Medium Wound Dressing one or multiple wounds 1 15 []  - Large Wound Dressing one or multiple wounds 0 X - Application of Medications - topical 1 5 []  - Application of Medications - injection 0 INTERVENTIONS - Miscellaneous []  - External ear exam 0 Plotner, Cobain L. ([id number]) []  - Specimen Collection (cultures, biopsies, blood, body fluids, etc.) 0 []  - Specimen(s) / Culture(s) sent or taken to Lab for analysis 0 []  - Patient Transfer (multiple staff / Harrel Lemon Lift / Similar devices) 0 []  - Simple Staple / Suture removal (25 or less) 0 []  - Complex Staple / Suture removal (26 or more) 0 []  - Hypo / Hyperglycemic Management (close monitor of Blood Glucose) 0 []  - Ankle / Brachial Index (ABI) - do not check if billed separately 0 X - Vital Signs 1 5 Has the patient been seen at the hospital within the last three years: Yes Total Score: 90 Level Of Care: New/Established - Level 3 Electronic Signature(s) Signed: 10/25/2016 4:34:11 PM By: Alric Quan Entered By: Alric Quan on 10/24/2016 09:30:34 [name] ([id number]) -------------------------------------------------------------------------------- Encounter Discharge Information Details Patient Name: [name]. Date of Service: 10/24/2016 8:45 AM Medical Record Number: [id number] Patient Account Number: [id number] Date of Birth/Sex: 1951/06/25 (66 y.o. Male) Treating RN: Ahmed Prima Primary Care Parnell Spieler: Other Clinician: Referring Weber Monnier: Yehuda Savannah Treating Riad Wagley/Extender: Frann Rider in Treatment: 10 Encounter Discharge Information Items Discharge Pain Level: 0 Discharge Condition: Stable Ambulatory Status: Ambulatory Discharge Destination: Home Transportation: Private Auto Accompanied By: wife Schedule Follow-up Appointment: Yes Medication Reconciliation completed and provided to Patient/Care No Ahuva Poynor: Provided on Clinical Summary of Care: 10/24/2016 Form Type Recipient Paper Patient [employer] Electronic Signature(s) Signed: 10/24/2016 9:22:49 AM By: Ruthine Dose Entered By: Ruthine Dose on 10/24/2016 09:22:48 [name] ([id number]) -------------------------------------------------------------------------------- Lower Extremity Assessment Details Patient Name: [name]. Date of Service: 10/24/2016 8:45 AM Medical Record Number: [id number] Patient Account Number: [id number] Date of Birth/Sex: 08/16/1950 (66 y.o. Male) Treating RN: Ahmed Prima Primary Care Ajayla Iglesias: Other Clinician: Referring Ondra Deboard: Yehuda Savannah Treating Krystal Delduca/Extender: Frann Rider in Treatment: 10 Edema Assessment Assessed: Shirlyn Goltz: No] [Right: No] E[Left: dema] [Right: :] Calf Left: Right: Point of Measurement: 35 cm From Medial Instep cm 39.7 cm Ankle Left: Right: Point of Measurement: 10 cm From Medial Instep cm 25.4 cm Vascular Assessment Pulses: Dorsalis Pedis Palpable: [Right:Yes] Posterior Tibial Extremity colors, hair growth, and conditions: Extremity Color: [Right:Hyperpigmented] Temperature of Extremity: [Right:Warm] Electronic Signature(s) Signed: 10/25/2016 4:34:11 PM By: Alric Quan Entered By: Alric Quan on 10/24/2016 09:04:30 [name] ([id number]) -------------------------------------------------------------------------------- Multi Wound Chart Details Patient Name: [name]. Date of Service: 10/24/2016 8:45 AM Medical Record Number:  [id number] Patient Account Number: [id number] Date of Birth/Sex: [birth year] (66 y.o. Male) Treating RN: Ahmed Prima Primary Care Traci Gafford: Other Clinician: Referring Dodger Sinning: Yehuda Savannah Treating Journiee Feldkamp/Extender: Frann Rider in Treatment: 10 Vital Signs Height(in): 72 Pulse(bpm): 52 Weight(lbs): 250 Blood Pressure 139/76 (mmHg): Body Mass Index(BMI): 34 Temperature(F): 97.8 Respiratory Rate 18 (breaths/min): Photos: [1:No Photos] [N/A:N/A] Wound Location: [1:Right Lower Leg - Lateral] [N/A:N/A] Wounding Event: [1:Trauma] [N/A:N/A] Primary Etiology: [1:Diabetic Wound/Ulcer of  the Lower Extremity] [N/A:N/A] Comorbid History: [1:Chronic Obstructive Pulmonary Disease (COPD), Arrhythmia, Hypertension, Myocardial Infarction, Type II Diabetes] [N/A:N/A] Date Acquired: [1:05/14/2016] [N/A:N/A] Weeks of Treatment: [1:10] [N/A:N/A] Wound Status: [1:Open] [N/A:N/A] Measurements L x W x D 5.3x1.3x0.1 [N/A:N/A] (cm) Area (cm) : [1:5.411] [N/A:N/A] Volume (cm) : [1:0.541] [N/A:N/A] % Reduction in Area: [1:63.00%] [N/A:N/A] % Reduction in Volume: 63.00% [N/A:N/A] Classification: [1:Grade 1] [N/A:N/A] Exudate Amount: [1:Large] [N/A:N/A] Exudate Type: [1:Serosanguineous] [N/A:N/A] Exudate Color: [1:red, brown] [N/A:N/A] Wound Margin: [1:Flat and Intact] [N/A:N/A] Granulation Amount: [1:Large (67-100%)] [N/A:N/A] Granulation Quality: [1:Pink, Pale] [N/A:N/A] Necrotic Amount: [1:Small (1-33%)] [N/A:N/A] Exposed Structures: [1:Fat Layer (Subcutaneous Tissue) Exposed: Yes] [N/A:N/A] Fascia: No Tendon: No Muscle: No Joint: No Bone: No Epithelialization: Small (1-33%) N/A N/A Periwound Skin Texture: Excoriation: Yes N/A N/A Scarring: Yes Induration: No Callus: No Crepitus: No Rash: No Periwound Skin Maceration: Yes N/A N/A Moisture: Dry/Scaly: No Periwound Skin Color: Hemosiderin Staining: Yes N/A N/A Mottled: Yes Atrophie Blanche: No Cyanosis:  No Ecchymosis: No Erythema: No Pallor: No Rubor: No Temperature: No Abnormality N/A N/A Tenderness on Yes N/A N/A Palpation: Wound Preparation: Ulcer Cleansing: N/A N/A Rinsed/Irrigated with Saline Topical Anesthetic Applied: Other: lidocaine 4% Treatment Notes Wound #1 (Right, Lateral Lower Leg) 1. Cleansed with: Clean wound with Normal Saline 2. Anesthetic Topical Lidocaine 4% cream to wound bed prior to debridement 3. Peri-wound Care: Skin Prep 4. Dressing Applied: Hydrogel Other dressing (specify in notes) 5. Secondary Dressing Applied Bordered Foam Dressing Dry Gauze Notes cutimed sorbact and hydrogel LINDON, KIEL ([id number]) Electronic Signature(s) Signed: 10/24/2016 9:21:32 AM By: Christin Fudge MD, FACS Entered By: Christin Fudge on 10/24/2016 09:21:31 [name] ([id number]) -------------------------------------------------------------------------------- [place] Details Patient Name: CARNEY, SAXTON. Date of Service: 10/24/2016 8:45 AM Medical Record Number: [id number] Patient Account Number: [id number] Date of Birth/Sex: 1950/12/26 (66 y.o. Male) Treating RN: Ahmed Prima Primary Care Kabir Brannock: Other Clinician: Referring Arianny Pun: Yehuda Savannah Treating Carlosdaniel Grob/Extender: Frann Rider in Treatment: 10 Active Inactive ` Orientation to the Wound Care Program Nursing Diagnoses: Knowledge deficit related to the wound healing center program Goals: Patient/caregiver will verbalize understanding of the [place] Program Date Initiated: 08/15/2016 Target Resolution Date: 09/05/2016 Goal Status: Active Interventions: Provide education on orientation to the wound center Notes: ` Venous Leg Ulcer Nursing Diagnoses: Knowledge deficit related to disease process and management Potential for venous Insuffiency (use before diagnosis confirmed) Goals: Patient will maintain optimal edema control Date  Initiated: 08/15/2016 Target Resolution Date: 09/05/2016 Goal Status: Active Patient/caregiver will verbalize understanding of disease process and disease management Date Initiated: 08/15/2016 Target Resolution Date: 09/05/2016 Goal Status: Active Verify adequate tissue perfusion prior to therapeutic compression application Date Initiated: 08/15/2016 Target Resolution Date: 09/05/2016 Goal Status: Active Interventions: Assess peripheral edema status every visit. Compression as ordered JOJUAN, CHAMPNEY ([id number]) Provide education on venous insufficiency Treatment Activities: Therapeutic compression applied : 08/15/2016 Notes: ` Wound/Skin Impairment Nursing Diagnoses: Impaired tissue integrity Knowledge deficit related to ulceration/compromised skin integrity Goals: Patient/caregiver will verbalize understanding of skin care regimen Date Initiated: 08/15/2016 Target Resolution Date: 09/05/2016 Goal Status: Active Ulcer/skin breakdown will have a volume reduction of 30% by week 4 Date Initiated: 08/15/2016 Target Resolution Date: 09/05/2016 Goal Status: Active Ulcer/skin breakdown will have a volume reduction of 50% by week 8 Date Initiated: 08/15/2016 Target Resolution Date: 09/05/2016 Goal Status: Active Ulcer/skin breakdown will have a volume reduction of 80% by week 12 Date Initiated: 08/15/2016 Target Resolution Date: 09/05/2016 Goal Status: Active Ulcer/skin breakdown will heal within 14 weeks  Date Initiated: 08/15/2016 Target Resolution Date: 09/05/2016 Goal Status: Active Interventions: Assess patient/caregiver ability to obtain necessary supplies Assess patient/caregiver ability to perform ulcer/skin care regimen upon admission and as needed Assess ulceration(s) every visit Provide education on smoking Provide education on ulcer and skin care Treatment Activities: Referred to DME Kennidee Heyne for dressing supplies : 08/15/2016 Skin care regimen initiated : 08/15/2016 Topical wound  management initiated : 08/15/2016 Notes: KODIAK, ROLLYSON ([id number]) Electronic Signature(s) Signed: 10/25/2016 4:34:11 PM By: Alric Quan Entered By: Alric Quan on 10/24/2016 09:04:34 [name] ([id number]) -------------------------------------------------------------------------------- Pain Assessment Details Patient Name: [name]. Date of Service: 10/24/2016 8:45 AM Medical Record Number: [id number] Patient Account Number: [id number] Date of Birth/Sex: 1950/12/21 (66 y.o. Male) Treating RN: Ahmed Prima Primary Care Wetona Viramontes: Other Clinician: Referring Demyan Fugate: Yehuda Savannah Treating Alecea Trego/Extender: Frann Rider in Treatment: 10 Active Problems Location of Pain Severity and Description of Pain Patient Has Paino No Site Locations With Dressing Change: No Pain Management and Medication Current Pain Management: Electronic Signature(s) Signed: 10/25/2016 4:34:11 PM By: Alric Quan Entered By: Alric Quan on 10/24/2016 08:55:28 [name] ([id number]) -------------------------------------------------------------------------------- Patient/Caregiver Education Details Patient Name: [name]. Date of Service: 10/24/2016 8:45 AM Medical Record Number: [id number] Patient Account Number: [id number] Date of Birth/Gender: 1950-11-05 (66 y.o. Male) Treating RN: Ahmed Prima Primary Care Physician: Other Clinician: Referring Physician: Yehuda Savannah Treating Physician/Extender: Frann Rider in Treatment: 10 Education Assessment Education Provided To: Patient Education Topics Provided Wound/Skin Impairment: Handouts: Other: change dressing as ordered Methods: Demonstration, Explain/Verbal Responses: State content correctly Electronic Signature(s) Signed: 10/25/2016 4:34:11 PM By: Alric Quan Entered By: Alric Quan on 10/24/2016 09:13:32 [name]  ([id number]) -------------------------------------------------------------------------------- Wound Assessment Details Patient Name: [name]. Date of Service: 10/24/2016 8:45 AM Medical Record Number: [id number] Patient Account Number: [id number] Date of Birth/Sex: November 26, 1950 (66 y.o. Male) Treating RN: Ahmed Prima Primary Care Angle Karel: Other Clinician: Referring Patricia Fargo: Yehuda Savannah Treating Astella Desir/Extender: Frann Rider in Treatment: 10 Wound Status Wound Number: 1 Primary Diabetic Wound/Ulcer of the Lower Etiology: Extremity Wound Location: Right Lower Leg - Lateral Wound Open Wounding Event: Trauma Status: Date Acquired: 05/14/2016 Comorbid Chronic Obstructive Pulmonary Disease Weeks Of Treatment: 10 History: (COPD), Arrhythmia, Hypertension, Clustered Wound: No Myocardial Infarction, Type II Diabetes Photos Photo Uploaded By: Alric Quan on 10/24/2016 16:17:17 Wound Measurements Length: (cm) 5.3 Width: (cm) 1.3 Depth: (cm) 0.1 Area: (cm) 5.411 Volume: (cm) 0.541 % Reduction in Area: 63% % Reduction in Volume: 63% Epithelialization: Small (1-33%) Tunneling: No Undermining: No Wound Description Classification: Grade 1 Wound Margin: Flat and Intact Exudate Amount: Large Exudate Type: Serosanguineous Exudate Color: red, brown Foul Odor After Cleansing: No Slough/Fibrino Yes Wound Bed Granulation Amount: Large (67-100%) Exposed Structure Granulation Quality: Pink, Pale Fascia Exposed: No Necrotic Amount: Small (1-33%) Fat Layer (Subcutaneous Tissue) Exposed: Yes Necrotic Quality: Adherent Slough Tendon Exposed: No CHRIST, FULLENWIDER. ([id number]) Muscle Exposed: No Joint Exposed: No Bone Exposed: No Periwound Skin Texture Texture Color No Abnormalities Noted: No No Abnormalities Noted: No Callus: No Atrophie Blanche: No Crepitus: No Cyanosis: No Excoriation: Yes Ecchymosis: No Induration: No Erythema: No Rash:  No Hemosiderin Staining: Yes Scarring: Yes Mottled: Yes Pallor: No Moisture Rubor: No No Abnormalities Noted: No Dry / Scaly: No Temperature / Pain Maceration: Yes Temperature: No Abnormality Tenderness on Palpation: Yes Wound Preparation Ulcer Cleansing: Rinsed/Irrigated with Saline Topical Anesthetic Applied: Other: lidocaine 4%, Treatment Notes Wound #1 (Right, Lateral Lower Leg) 1. Cleansed with: Clean wound with  Normal Saline 2. Anesthetic Topical Lidocaine 4% cream to wound bed prior to debridement 3. Peri-wound Care: Skin Prep 4. Dressing Applied: Hydrogel Other dressing (specify in notes) 5. Secondary Dressing Applied Bordered Foam Dressing Dry Gauze Notes cutimed sorbact and hydrogel Electronic Signature(s) Signed: 10/25/2016 4:34:11 PM By: Alric Quan Entered By: Alric Quan on 10/24/2016 09:01:48 [name] ([id number]) -------------------------------------------------------------------------------- [place] Details Patient Name: [name]. Date of Service: 10/24/2016 8:45 AM Medical Record Number: [id number] Patient Account Number: [id number] Date of Birth/Sex: 1951/02/22 (66 y.o. Male) Treating RN: Ahmed Prima Primary Care Zayveon Raschke: Other Clinician: Referring Shanley Furlough: Yehuda Savannah Treating Teaira Croft/Extender: Frann Rider in Treatment: 10 Vital Signs Time Taken: 08:56 Temperature (F): 97.8 Height (in): 72 Pulse (bpm): 52 Weight (lbs): 250 Respiratory Rate (breaths/min): 18 Body Mass Index (BMI): 33.9 Blood Pressure (mmHg): 139/76 Reference Range: 80 - 120 mg / dl Electronic Signature(s) Signed: 10/25/2016 4:34:11 PM By: Alric Quan Entered By: Alric Quan on 10/24/2016 08:57:09

## 2016-10-28 ENCOUNTER — Ambulatory Visit (HOSPITAL_COMMUNITY)
Admission: RE | Admit: 2016-10-28 | Discharge: 2016-10-28 | Disposition: A | Discharge status: Home / Self Care | Payer: Medicare HMO | Source: Ambulatory Visit | Attending: Surgery | Admitting: Surgery

## 2016-10-28 DIAGNOSIS — R609 Edema, unspecified: Secondary | ICD-10-CM | POA: Diagnosis present

## 2016-10-28 DIAGNOSIS — L97212 Non-pressure chronic ulcer of right calf with fat layer exposed: Secondary | ICD-10-CM | POA: Diagnosis not present

## 2016-10-28 DIAGNOSIS — I83019 Varicose veins of right lower extremity with ulcer of unspecified site: Secondary | ICD-10-CM

## 2016-10-28 DIAGNOSIS — I8391 Asymptomatic varicose veins of right lower extremity: Secondary | ICD-10-CM | POA: Diagnosis not present

## 2016-10-29 ENCOUNTER — Encounter: Payer: Self-pay | Admitting: Vascular Surgery

## 2016-10-31 ENCOUNTER — Encounter: Payer: Medicare HMO | Admitting: Surgery

## 2016-10-31 DIAGNOSIS — Z8673 Personal history of transient ischemic attack (TIA), and cerebral infarction without residual deficits: Secondary | ICD-10-CM | POA: Diagnosis not present

## 2016-10-31 DIAGNOSIS — I10 Essential (primary) hypertension: Secondary | ICD-10-CM | POA: Diagnosis not present

## 2016-10-31 DIAGNOSIS — J449 Chronic obstructive pulmonary disease, unspecified: Secondary | ICD-10-CM | POA: Diagnosis not present

## 2016-10-31 DIAGNOSIS — E039 Hypothyroidism, unspecified: Secondary | ICD-10-CM | POA: Diagnosis not present

## 2016-10-31 DIAGNOSIS — I872 Venous insufficiency (chronic) (peripheral): Secondary | ICD-10-CM | POA: Diagnosis not present

## 2016-10-31 DIAGNOSIS — L97212 Non-pressure chronic ulcer of right calf with fat layer exposed: Secondary | ICD-10-CM | POA: Diagnosis not present

## 2016-10-31 DIAGNOSIS — I252 Old myocardial infarction: Secondary | ICD-10-CM | POA: Diagnosis not present

## 2016-10-31 DIAGNOSIS — E11622 Type 2 diabetes mellitus with other skin ulcer: Secondary | ICD-10-CM | POA: Diagnosis not present

## 2016-10-31 DIAGNOSIS — F17218 Nicotine dependence, cigarettes, with other nicotine-induced disorders: Secondary | ICD-10-CM | POA: Diagnosis not present

## 2016-11-01 NOTE — Progress Notes (Signed)
MERREL, CRABBE ([id number]) Visit Report for 10/31/2016 Debridement Details Patient Name: Brad, Taylor. Date of Service: 10/31/2016 8:45 AM Medical Record Number: [id number] Patient Account Number: [id number] Date of Birth/Sex: 1951-02-03 (66 y.o. Male) Treating RN: Ahmed Prima Primary Care Provider: Jenna Luo Other Clinician: Referring Provider: Jenna Luo Treating Provider/Extender: Frann Rider in Treatment: 11 Debridement Performed for Wound #1 Right,Lateral Lower Leg Assessment: Performed By: Physician Christin Fudge, MD Debridement: Debridement Pre-procedure Yes - 09:14 Verification/Time Out Taken: Start Time: 09:15 Pain Control: Lidocaine 4% Topical Solution Level: Skin/Subcutaneous Tissue Total Area Debrided (L x 4.8 (cm) x 1.4 (cm) = 6.72 (cm) W): Tissue and other Viable, Non-Viable, Exudate, Fibrin/Slough, Subcutaneous material debrided: Instrument: Curette Bleeding: Minimum Hemostasis Achieved: Pressure End Time: 09:17 Procedural Pain: 0 Post Procedural Pain: 0 Response to Treatment: Procedure was tolerated well Post Debridement Measurements of Total Wound Length: (cm) 4.8 Width: (cm) 1.4 Depth: (cm) 0.1 Volume: (cm) 0.528 Character of Wound/Ulcer Post Requires Further Debridement Debridement: Severity of Tissue Post Debridement: Fat layer exposed Post Procedure Diagnosis Same as Pre-procedure Electronic Signature(s) Signed: 10/31/2016 3:13:18 PM By: Christin Fudge MD, FACS Brad Taylor, Brad Taylor ([id number]) Signed: 10/31/2016 5:02:31 PM By: Alric Quan Entered By: Alric Quan on 10/31/2016 09:16:17 Brad Taylor ([id number]) -------------------------------------------------------------------------------- HPI Details Patient Name: Brad Taylor, Brad Taylor L. Date of Service: 10/31/2016 8:45 AM Medical Record Number: [id number] Patient Account Number: [id number] Date of Birth/Sex: [birth year] (66 y.o. Male) Treating RN: Ahmed Prima Primary Care Provider: Jenna Luo Other Clinician: Referring Provider: Jenna Luo Treating Provider/Extender: Frann Rider in Treatment: 11 History of Present Illness Location: right lower extremity Quality: Patient reports experiencing a dull pain to affected area(s). Severity: Patient states wound are getting worse. Duration: Patient has had the wound for > 3 months prior to seeking treatment at the wound center Timing: Pain in wound is Intermittent (comes and goes Context: The wound occurred when the patient had a lacerated wound which was treated with sutures which gaped and had an open ulceration Modifying Factors: Other treatment(s) tried include:been having on and Unna's boots applied by his PCP in the office Associated Signs and Symptoms: Patient reports having increase swelling. HPI Description: 66 year old gentleman with a past medical history of stroke, diabetes mellitus2 with hyperglycemia, CVA, hemiplegia, ahasia, cellulitis of the left gluteal area and is a smoker and smokes a packet of cigarettes a day. His past medical history also significant for COPD, thyroid disease and is status post cholecystectomy and status post knee surgery in October 2017 the patient had a laceration to his right lateral shin and had a deep tear which was sutured with 4-0 Ethilon sutures but these opened out because of his chronic venous insufficiency and severe edema and the patient was seen by his PCP and put on Bactrim. He was also advised to do dressings with Silvadene and Unna's boots. We don't have notes from the [facility] in [place] but he says he's had a thorough workup done both arterial and venous and was told that he has venous insufficiency and was to wear compression stockings of 30-40 mm variety. We'll try and obtain these notes. He also gives a history of a lung mass which was a cancer and treated with resection but no chemotherapy or radiation. 08/29/2016  -- not yet received any of his notes from the [facility] 09/05/2016 -- in spite of every effort on the patient's part and are part we have not been able to get any reports regarding his arterial and venous  workup. At this stage I'm going to request fresh arterial duplex studies and venous reflux studies. 09/12/2016 -- the patient has not yet got his arterial and venous duplex study reports and refuses to do these tests again. He says he will get the reports back in about 2 weeks' time. He also says he has had a lot of pain and this lower extremity using Hydrofera Blue and asks to change the dressing material. 09/19/2016 -- patient's wife did talk to the doctor at the [place] and they will send as the old report of arterial and venous duplex studies and if need be they will repeat it again. Rash request will be sent by [place]. 09/26/2016 -- my office has sent the requests for arterial venous duplex studies to the [facility] and they have received it and the patient is waiting back for an appointment. October 03 2016 -- Notes reviewed from the [facility] were reviewed and in June 2016 he was scheduled to have a right upper lobe lung surgery by [facility] and I presume this was done as planned. A note from June 2017 stated that he was status post right upper lobectomy with mediastinal lymphadenectomy for a T2 Beverlin, Tyrell L. ([id number]) N0 MX well-differentiated squamous cell carcinoma. After the multidisciplinary oncology conference he was recommended surveillance protocol. We also received some other notes which the patient has brought in today form November 2013, he had normal ABIs on 06/11/2012. Right was 0.9 to left was 1.12. Toe brachial index was 0.78 on the right and left was 0.67. He had a diagnosis of venous insufficiency and at that stage Unnas boots were applied. There are no recent notes from either arterial or venous duplex studies done since then. In discussing the previous workup and discussing  the fact that he required a arterial duplex and a venous reflux study to be done, the patient suddenly started showing signs of being aggressive and rude and was being very aggressive in his conversation about what he wanted to and what he did not want to do. At this stage I had to firmly tell him to set up boundaries and not talk rudedly to either my staff or me and to not to threaten [place] with physical abuse. He calmed down for a while and agreed to get his workup done. 10/17/2016 -- he had an ABI examination of both lower extremities done at the [facility] and his right ABI was 0.80 and the left was 0.99. his venous duplex for reflux is to be done on March 26 10/24/2016 -- the patient says he did not tolerate the 3 layer compression and removed it and what his own compression stockings. His venous duplex study for reflux is scheduled for next Monday. 10/31/2016 -- the patient had a lower extremity venous duplex to reflux evaluation done on 10/28/2016 which showed venous incompetence in the right saphenofemoral junction, greater saphenous vein, small saphenous vein and common femoral vein. These results I have strongly recommended an appointment to see the vascular surgeons at the office to see for endovenous ablation is a possibility. Electronic Signature(s) Signed: 10/31/2016 9:28:14 AM By: Christin Fudge MD, FACS Previous Signature: 10/31/2016 9:25:46 AM Version By: Christin Fudge MD, FACS Entered By: Christin Fudge on 10/31/2016 09:28:13 Brad Taylor ([id number]) -------------------------------------------------------------------------------- Physical Exam Details Patient Name: Brad Taylor, Brad Taylor L. Date of Service: 10/31/2016 8:45 AM Medical Record Number: [id number] Patient Account Number: [id number] Date of Birth/Sex: Feb 17, 1951 (66 y.o. Male) Treating RN: Ahmed Prima Primary Care Provider:  Jenna Luo Other Clinician: Referring Provider: Jenna Luo Treating  Provider/Extender: Frann Rider in Treatment: 11 Constitutional . Pulse regular. Respirations normal and unlabored. Afebrile. . Eyes Nonicteric. Reactive to light. Ears, Nose, Mouth, and Throat Lips, teeth, and gums WNL.Marland Kitchen Moist mucosa without lesions. Neck supple and nontender. No palpable supraclavicular or cervical adenopathy. Normal sized without goiter. Respiratory WNL. No retractions.. Breath sounds WNL, No rubs, rales, rhonchi, or wheeze.. Cardiovascular Heart rhythm and rate regular, no murmur or gallop.. Pedal Pulses WNL. No clubbing, cyanosis or edema. Chest Breasts symmetical and no nipple discharge.. Breast tissue WNL, no masses, lumps, or tenderness.. Lymphatic No adneopathy. No adenopathy. No adenopathy. Musculoskeletal Adexa without tenderness or enlargement.. Digits and nails w/o clubbing, cyanosis, infection, petechiae, ischemia, or inflammatory conditions.. Integumentary (Hair, Skin) No suspicious lesions. No crepitus or fluctuance. No peri-wound warmth or erythema. No masses.Marland Kitchen Psychiatric Judgement and insight Intact.. No evidence of depression, anxiety, or agitation.. Notes the lymphedema is a bit better and there is significant amount of slough which have sharply removed with a #3 curet. Bleeding controlled with pressure. Electronic Signature(s) Signed: 10/31/2016 9:28:38 AM By: Christin Fudge MD, FACS Previous Signature: 10/31/2016 9:26:09 AM Version By: Christin Fudge MD, FACS Entered By: Christin Fudge on 10/31/2016 09:28:37 Brad Taylor ([id number]) -------------------------------------------------------------------------------- Physician Orders Details Patient Name: Brad Taylor, Brad Taylor. Date of Service: 10/31/2016 8:45 AM Medical Record Number: [id number] Patient Account Number: [id number] Date of Birth/Sex: 01/05/1951 (66 y.o. Male) Treating RN: Ahmed Prima Primary Care Provider: Jenna Luo Other Clinician: Referring Provider: Jenna Luo Treating Provider/Extender: Frann Rider in Treatment: 27 Verbal / Phone Orders: Yes Clinician: Carolyne Fiscal, Debi Read Back and Verified: Yes Diagnosis Coding Wound Cleansing Wound #1 Right,Lateral Lower Leg o Cleanse wound with mild soap and water - in clinic Anesthetic Wound #1 Right,Lateral Lower Leg o Topical Lidocaine 4% cream applied to wound bed prior to debridement Skin Barriers/Peri-Wound Care Wound #1 Right,Lateral Lower Leg o Skin Prep Primary Wound Dressing Wound #1 Right,Lateral Lower Leg o Other: - hydrogel and sorbact Secondary Dressing Wound #1 Right,Lateral Lower Leg o Dry Gauze o Boardered Foam Dressing Dressing Change Frequency o Change dressing every other day. Follow-up Appointments Wound #1 Right,Lateral Lower Leg o Return Appointment in 2 weeks. Edema Control Wound #1 Right,Lateral Lower Leg o Elevate legs to the level of the heart and pump ankles as often as possible Additional Orders / Instructions Wound #1 Right,Lateral Lower Leg o Stop Smoking Brad Taylor, Brad L. ([id number]) o Increase protein intake. o Activity as tolerated o Other: - ZINC, VIT A, VIT C, MVI INCLUDE THESE IN YOU DIET Electronic Signature(s) Signed: 10/31/2016 3:13:18 PM By: Christin Fudge MD, FACS Signed: 10/31/2016 5:02:31 PM By: Alric Quan Entered By: Alric Quan on 10/31/2016 09:24:55 Brad Taylor ([id number]) -------------------------------------------------------------------------------- Progress Note Details Patient Name: Brad Taylor, Brad Taylor L. Date of Service: 10/31/2016 8:45 AM Medical Record Number: [id number] Patient Account Number: [id number] Date of Birth/Sex: [birth year] (66 y.o. Male) Treating RN: Ahmed Prima Primary Care Provider: Jenna Luo Other Clinician: Referring Provider: Jenna Luo Treating Provider/Extender: Frann Rider in Treatment: 11 Subjective History of Present Illness (HPI) The  following HPI elements were documented for the patient's wound: Location: right lower extremity Quality: Patient reports experiencing a dull pain to affected area(s). Severity: Patient states wound are getting worse. Duration: Patient has had the wound for > 3 months prior to seeking treatment at the wound center Timing: Pain in wound is Intermittent (comes and goes Context: The wound occurred when the patient  had a lacerated wound which was treated with sutures which gaped and had an open ulceration Modifying Factors: Other treatment(s) tried include:been having on and Unna's boots applied by his PCP in the office Associated Signs and Symptoms: Patient reports having increase swelling. 66 year old gentleman with a past medical history of stroke, diabetes mellitus2 with hyperglycemia, CVA, hemiplegia, ahasia, cellulitis of the left gluteal area and is a smoker and smokes a packet of cigarettes a day. His past medical history also significant for COPD, thyroid disease and is status post cholecystectomy and status post knee surgery in October 2017 the patient had a laceration to his right lateral shin and had a deep tear which was sutured with 4-0 Ethilon sutures but these opened out because of his chronic venous insufficiency and severe edema and the patient was seen by his PCP and put on Bactrim. He was also advised to do dressings with Silvadene and Unna's boots. We don't have notes from the [facility] in [place] but he says he's had a thorough workup done both arterial and venous and was told that he has venous insufficiency and was to wear compression stockings of 30-40 mm variety. We'll try and obtain these notes. He also gives a history of a lung mass which was a cancer and treated with resection but no chemotherapy or radiation. 08/29/2016 -- not yet received any of his notes from the [facility] 09/05/2016 -- in spite of every effort on the patient's part and are part we have not been  able to get any reports regarding his arterial and venous workup. At this stage I'm going to request fresh arterial duplex studies and venous reflux studies. 09/12/2016 -- the patient has not yet got his arterial and venous duplex study reports and refuses to do these tests again. He says he will get the reports back in about 2 weeks' time. He also says he has had a lot of pain and this lower extremity using Hydrofera Blue and asks to change the dressing material. 09/19/2016 -- patient's wife did talk to the doctor at the [place] and they will send as the old report of arterial and venous duplex studies and if need be they will repeat it again. Rash request will be sent by [place]. 09/26/2016 -- my office has sent the requests for arterial venous duplex studies to the [facility] and they have received it and the patient is waiting back for an appointment. Brad Taylor, Brad Taylor ([id number]) October 03 2016 -- Notes reviewed from the [facility] were reviewed and in June 2016 he was scheduled to have a right upper lobe lung surgery by [facility] and I presume this was done as planned. A note from June 2017 stated that he was status post right upper lobectomy with mediastinal lymphadenectomy for a T2 N0 MX well-differentiated squamous cell carcinoma. After the multidisciplinary oncology conference he was recommended surveillance protocol. We also received some other notes which the patient has brought in today form November 2013, he had normal ABIs on 06/11/2012. Right was 0.9 to left was 1.12. Toe brachial index was 0.78 on the right and left was 0.67. He had a diagnosis of venous insufficiency and at that stage Unnas boots were applied. There are no recent notes from either arterial or venous duplex studies done since then. In discussing the previous workup and discussing the fact that he required a arterial duplex and a venous reflux study to be done, the patient suddenly started showing signs of being aggressive and  rude and was being very aggressive in his conversation about what he wanted to and what he did not want to do. At this stage I had to firmly tell him to set up boundaries and not talk rudedly to either my staff or me and to not to threaten [place] with physical abuse. He calmed down for a while and agreed to get his workup done. 10/17/2016 -- he had an ABI examination of both lower extremities done at the [facility] and his right ABI was 0.80 and the left was 0.99. his venous duplex for reflux is to be done on March 26 10/24/2016 -- the patient says he did not tolerate the 3 layer compression and removed it and what his own compression stockings. His venous duplex study for reflux is scheduled for next Monday. 10/31/2016 -- the patient had a lower extremity venous duplex to reflux evaluation done on 10/28/2016 which showed venous incompetence in the right saphenofemoral junction, greater saphenous vein, small saphenous vein and common femoral vein. These results I have strongly recommended an appointment to see the vascular surgeons at the office to see for endovenous ablation is a possibility. Objective Constitutional Pulse regular. Respirations normal and unlabored. Afebrile. Vitals Time Taken: 9:01 AM, Height: 72 in, Weight: 250 lbs, BMI: 33.9, Temperature: 98.1 F, Pulse: 68 bpm, Respiratory Rate: 18 breaths/min, Blood Pressure: 144/75 mmHg. Eyes Nonicteric. Reactive to light. Ears, Nose, Mouth, and Throat Lips, teeth, and gums WNL.Marland Kitchen Moist mucosa without lesions. Neck supple and nontender. No palpable supraclavicular or cervical adenopathy. Normal sized without goiter. Brad Taylor, Brad Taylor ([id number]) Respiratory WNL. No retractions.. Breath sounds WNL, No rubs, rales, rhonchi, or wheeze.. Cardiovascular Heart rhythm and rate regular, no murmur or gallop.. Pedal Pulses WNL. No clubbing, cyanosis or edema. Chest Breasts symmetical and no nipple discharge.. Breast tissue WNL,  no masses, lumps, or tenderness.. Lymphatic No adneopathy. No adenopathy. No adenopathy. Musculoskeletal Adexa without tenderness or enlargement.. Digits and nails w/o clubbing, cyanosis, infection, petechiae, ischemia, or inflammatory conditions.Marland Kitchen Psychiatric Judgement and insight Intact.. No evidence of depression, anxiety, or agitation.. General Notes: the lymphedema is a bit better and there is significant amount of slough which have sharply removed with a #3 curet. Bleeding controlled with pressure. Integumentary (Hair, Skin) No suspicious lesions. No crepitus or fluctuance. No peri-wound warmth or erythema. No masses.. Wound #1 status is Open. Original cause of wound was Trauma. The wound is located on the Right,Lateral Lower Leg. The wound measures 4.8cm length x 1.4cm width x 0.1cm depth; 5.278cm^2 area and 0.528cm^3 volume. There is Fat Layer (Subcutaneous Tissue) Exposed exposed. There is no tunneling or undermining noted. There is a large amount of serosanguineous drainage noted. The wound margin is flat and intact. There is large (67-100%) pink, pale granulation within the wound bed. There is a small (1-33%) amount of necrotic tissue within the wound bed including Adherent Slough. The periwound skin appearance exhibited: Excoriation, Scarring, Maceration, Hemosiderin Staining, Mottled. The periwound skin appearance did not exhibit: Callus, Crepitus, Induration, Rash, Dry/Scaly, Atrophie Blanche, Cyanosis, Ecchymosis, Pallor, Rubor, Erythema. Periwound temperature was noted as No Abnormality. The periwound has tenderness on palpation. Procedures Wound #1 Wound #1 is a Diabetic Wound/Ulcer of the Lower Extremity located on the Right,Lateral Lower Leg . There was a Skin/Subcutaneous Tissue Debridement ([id number]) debridement with total area of 6.72 sq cm performed by Christin Fudge, MD. with the following instrument(s): Curette to remove Viable and Non-Viable tissue/material  including Exudate, Fibrin/Slough, and Subcutaneous after achieving pain control using  Lidocaine 4% Topical Solution. A time out was conducted at 09:14, prior to the start of the procedure. A Minimum amount of bleeding was controlled with Pressure. The procedure was tolerated well with a pain Brad Taylor, Brad Taylor ([id number]) level of 0 throughout and a pain level of 0 following the procedure. Post Debridement Measurements: 4.8cm length x 1.4cm width x 0.1cm depth; 0.528cm^3 volume. Character of Wound/Ulcer Post Debridement requires further debridement. Severity of Tissue Post Debridement is: Fat layer exposed. Post procedure Diagnosis Wound #1: Same as Pre-Procedure Plan Wound Cleansing: Wound #1 Right,Lateral Lower Leg: Cleanse wound with mild soap and water - in clinic Anesthetic: Wound #1 Right,Lateral Lower Leg: Topical Lidocaine 4% cream applied to wound bed prior to debridement Skin Barriers/Peri-Wound Care: Wound #1 Right,Lateral Lower Leg: Skin Prep Primary Wound Dressing: Wound #1 Right,Lateral Lower Leg: Other: - hydrogel and sorbact Secondary Dressing: Wound #1 Right,Lateral Lower Leg: Dry Gauze Boardered Foam Dressing Dressing Change Frequency: Change dressing every other day. Follow-up Appointments: Wound #1 Right,Lateral Lower Leg: Return Appointment in 2 weeks. Edema Control: Wound #1 Right,Lateral Lower Leg: Elevate legs to the level of the heart and pump ankles as often as possible Additional Orders / Instructions: Wound #1 Right,Lateral Lower Leg: Stop Smoking Increase protein intake. Activity as tolerated Other: - ZINC, VIT A, VIT C, MVI INCLUDE THESE IN YOU DIET Brad Taylor, Brad L. ([id number]) I have recommended: 1. Hydrogel with Sorbact with a 3 layer Profore -- The patient is not compliant with wearing this and wants to use his own compression stockings. 2. Good control of his diabetes mellitus and I have asked him to see his PCP for further care 3.  elevation and exercise 4. Adequate protein, vitamin A, vitamin C and zinc 5. Regular visits to the wound center 6. venous duplex studies been done and I have reviewed the report which shows significant amount of this reflux of the right lower extremity and hence he has been referred to the vascular surgeons at [facility] he and his wife have read all questions answered and will be compliant. they are finding it difficult to come here every week and would like to come every's 2 weeks Electronic Signature(s) Signed: 10/31/2016 9:29:02 AM By: Christin Fudge MD, FACS Previous Signature: 10/31/2016 9:27:50 AM Version By: Christin Fudge MD, FACS Entered By: Christin Fudge on 10/31/2016 [place] ([id number]) -------------------------------------------------------------------------------- SuperBill Details Patient Name: Brad Taylor, CASPERS L. Date of Service: 10/31/2016 Medical Record Number: [id number] Patient Account Number: [id number] Date of Birth/Sex: 06-01-1951 (66 y.o. Male) Treating RN: Ahmed Prima Primary Care Provider: Jenna Luo Other Clinician: Referring Provider: Jenna Luo Treating Provider/Extender: Frann Rider in Treatment: 11 Diagnosis Coding ICD-10 Codes Code Description E11.622 Type 2 diabetes mellitus with other skin ulcer L97.212 Non-pressure chronic ulcer of right calf with fat layer exposed I87.2 Venous insufficiency (chronic) (peripheral) F17.218 Nicotine dependence, cigarettes, with other nicotine-induced disorders I73.9 Peripheral vascular disease, unspecified Facility Procedures CPT4 Code: [id number] Description: 62229 - DEB SUBQ TISSUE 20 SQ CM/< ICD-10 Description Diagnosis E11.622 Type 2 diabetes mellitus with other skin ulcer L97.212 Non-pressure chronic ulcer of right calf with fat I87.2 Venous insufficiency (chronic) (peripheral) I73.9  Peripheral vascular disease, unspecified Modifier: layer exposed Quantity: 1 Physician  Procedures CPT4 Code: 7989211 Description: 99213 - WC PHYS LEVEL 3 - EST PT ICD-10 Description Diagnosis E11.622 Type 2 diabetes mellitus with other skin ulcer L97.212 Non-pressure chronic ulcer of right calf with fat I87.2 Venous insufficiency (chronic) (peripheral) I73.9 Peripheral  vascular disease, unspecified Modifier: 25  layer exposed Quantity: 1 CPT4 Code: 5909311 Macht, WALT Description: 11042 - WC PHYS SUBQ TISS 20 SQ CM ICD-10 Description Diagnosis E11.622 Type 2 diabetes mellitus with other skin ulcer L97.212 Non-pressure chronic ulcer of right calf with fat I87.2 Venous insufficiency (chronic) (peripheral) ER L.  ([id number]) Modifier: layer exposed Quantity: 1 Electronic Signature(s) Signed: 10/31/2016 9:29:26 AM By: Christin Fudge MD, FACS Entered By: Christin Fudge on 10/31/2016 07:22:57

## 2016-11-01 NOTE — Progress Notes (Signed)
BARLOW, HARRISON ([id number]) Visit Report for 10/31/2016 Arrival Information Details Patient Name: COLBY, REELS. Date of Service: 10/31/2016 8:45 AM Medical Record Number: [id number] Patient Account Number: [id number] Date of Birth/Sex: [birth year] (66 y.o. Male) Treating RN: Ahmed Prima Primary Care Shekela Goodridge: Jenna Luo Other Clinician: Referring Meshach Perry: Jenna Luo Treating Sakina Briones/Extender: Frann Rider in Treatment: 11 Visit Information History Since Last Visit All ordered tests and consults were completed: No Patient Arrived: Ambulatory Added or deleted any medications: No Arrival Time: 08:59 Any new allergies or adverse reactions: No Accompanied By: wife Had a fall or experienced change in No Transfer Assistance: None activities of daily living that may affect Patient Identification Verified: Yes risk of falls: Secondary Verification Process Yes Signs or symptoms of abuse/neglect since last No Completed: visito Patient Requires Transmission- No Hospitalized since last visit: No Based Precautions: Pain Present Now: No Patient Has Alerts: Yes Patient Alerts: Hgb A1c 8.4 10/10/2016 [facility] ABI: L) 0.99 R) 0.80 Electronic Signature(s) Signed: 10/31/2016 5:02:31 PM By: Alric Quan Entered By: Alric Quan on 10/31/2016 09:01:12 [name] ([id number]) -------------------------------------------------------------------------------- Encounter Discharge Information Details Patient Name: [name]. Date of Service: 10/31/2016 8:45 AM Medical Record Number: [id number] Patient Account Number: [id number] Date of Birth/Sex: [birth year] (66 y.o. Male) Treating RN: Ahmed Prima Primary Care Kinzey Sheriff: Jenna Luo Other Clinician: Referring Lanore Renderos: Jenna Luo Treating Yovany Clock/Extender: Frann Rider in Treatment: 11 Encounter Discharge Information Items Discharge Pain Level: 0 Discharge Condition: Stable Ambulatory  Status: Ambulatory Discharge Destination: Home Transportation: Private Auto Accompanied By: wife Schedule Follow-up Appointment: Yes Medication Reconciliation completed and provided to Patient/Care No Lexine Jaspers: Provided on Clinical Summary of Care: 10/31/2016 Form Type Recipient Paper Patient [employer] Electronic Signature(s) Signed: 10/31/2016 9:25:42 AM By: Ruthine Dose Entered By: Ruthine Dose on 10/31/2016 09:25:41 [name] ([id number]) -------------------------------------------------------------------------------- Lower Extremity Assessment Details Patient Name: [name]. Date of Service: 10/31/2016 8:45 AM Medical Record Number: [id number] Patient Account Number: [id number] Date of Birth/Sex: [birth year] (66 y.o. Male) Treating RN: Ahmed Prima Primary Care Zayah Keilman: Jenna Luo Other Clinician: Referring Fulton Merry: Jenna Luo Treating Nekeya Briski/Extender: Frann Rider in Treatment: 11 Vascular Assessment Pulses: Dorsalis Pedis Palpable: [Right:Yes] Posterior Tibial Extremity colors, hair growth, and conditions: Extremity Color: [Right:Hyperpigmented] Temperature of Extremity: [Right:Warm] Capillary Refill: [Right:< 3 seconds] Electronic Signature(s) Signed: 10/31/2016 5:02:31 PM By: Alric Quan Entered By: Alric Quan on 10/31/2016 09:09:11 [name] ([id number]) -------------------------------------------------------------------------------- Multi Wound Chart Details Patient Name: [name]. Date of Service: 10/31/2016 8:45 AM Medical Record Number: [id number] Patient Account Number: [id number] Date of Birth/Sex: 03/11/1951 (66 y.o. Male) Treating RN: Ahmed Prima Primary Care Harmonee Tozer: Jenna Luo Other Clinician: Referring Herminio Kniskern: Jenna Luo Treating Finnick Orosz/Extender: Frann Rider in Treatment: 11 Vital Signs Height(in): 72 Pulse(bpm): 68 Weight(lbs): 250 Blood  Pressure 144/75 (mmHg): Body Mass Index(BMI): 34 Temperature(F): 98.1 Respiratory Rate 18 (breaths/min): Photos: [1:No Photos] [N/A:N/A] Wound Location: [1:Right Lower Leg - Lateral] [N/A:N/A] Wounding Event: [1:Trauma] [N/A:N/A] Primary Etiology: [1:Diabetic Wound/Ulcer of the Lower Extremity] [N/A:N/A] Comorbid History: [1:Chronic Obstructive Pulmonary Disease (COPD), Arrhythmia, Hypertension, Myocardial Infarction, Type II Diabetes] [N/A:N/A] Date Acquired: [1:05/14/2016] [N/A:N/A] Weeks of Treatment: [1:11] [N/A:N/A] Wound Status: [1:Open] [N/A:N/A] Measurements L x W x D 4.8x1.4x0.1 [N/A:N/A] (cm) Area (cm) : [1:5.278] [N/A:N/A] Volume (cm) : [1:0.528] [N/A:N/A] % Reduction in Area: [1:63.90%] [N/A:N/A] % Reduction in Volume: 63.90% [N/A:N/A] Classification: [1:Grade 1] [N/A:N/A] Exudate Amount: [1:Large] [N/A:N/A] Exudate Type: [1:Serosanguineous] [N/A:N/A] Exudate Color: [1:red, brown] [N/A:N/A] Wound Margin: [1:Flat and Intact] [N/A:N/A] Granulation Amount: [1:Large (67-100%)] [N/A:N/A]  Granulation Quality: [1:Pink, Pale] [N/A:N/A] Necrotic Amount: [1:Small (1-33%)] [N/A:N/A] Exposed Structures: [1:Fat Layer (Subcutaneous Tissue) Exposed: Yes] [N/A:N/A] Fascia: No Tendon: No Muscle: No Joint: No Bone: No Epithelialization: Small (1-33%) N/A N/A Periwound Skin Texture: Excoriation: Yes N/A N/A Scarring: Yes Induration: No Callus: No Crepitus: No Rash: No Periwound Skin Maceration: Yes N/A N/A Moisture: Dry/Scaly: No Periwound Skin Color: Hemosiderin Staining: Yes N/A N/A Mottled: Yes Atrophie Blanche: No Cyanosis: No Ecchymosis: No Erythema: No Pallor: No Rubor: No Temperature: No Abnormality N/A N/A Tenderness on Yes N/A N/A Palpation: Wound Preparation: Ulcer Cleansing: N/A N/A Rinsed/Irrigated with Saline Topical Anesthetic Applied: Other: lidocaine 4% Treatment Notes Electronic Signature(s) Signed: 10/31/2016 5:02:31 PM By: Alric Quan Entered By: Alric Quan on 10/31/2016 09:12:27 [name] ([id number]) -------------------------------------------------------------------------------- [place] Details Patient Name: ALPHONSUS, DOYEL. Date of Service: 10/31/2016 8:45 AM Medical Record Number: [id number] Patient Account Number: [id number] Date of Birth/Sex: September 09, 1950 (67 y.o. Male) Treating RN: Ahmed Prima Primary Care Onita Pfluger: Jenna Luo Other Clinician: Referring Precious Segall: Jenna Luo Treating Tamanika Heiney/Extender: Frann Rider in Treatment: 11 Active Inactive ` Orientation to the Wound Care Program Nursing Diagnoses: Knowledge deficit related to the wound healing center program Goals: Patient/caregiver will verbalize understanding of the [place] Program Date Initiated: 08/15/2016 Target Resolution Date: 09/05/2016 Goal Status: Active Interventions: Provide education on orientation to the wound center Notes: ` Venous Leg Ulcer Nursing Diagnoses: Knowledge deficit related to disease process and management Potential for venous Insuffiency (use before diagnosis confirmed) Goals: Patient will maintain optimal edema control Date Initiated: 08/15/2016 Target Resolution Date: 09/05/2016 Goal Status: Active Patient/caregiver will verbalize understanding of disease process and disease management Date Initiated: 08/15/2016 Target Resolution Date: 09/05/2016 Goal Status: Active Verify adequate tissue perfusion prior to therapeutic compression application Date Initiated: 08/15/2016 Target Resolution Date: 09/05/2016 Goal Status: Active Interventions: Assess peripheral edema status every visit. Compression as ordered KINGDAVID, LEINBACH ([id number]) Provide education on venous insufficiency Treatment Activities: Therapeutic compression applied : 08/15/2016 Notes: ` Wound/Skin Impairment Nursing Diagnoses: Impaired tissue integrity Knowledge deficit  related to ulceration/compromised skin integrity Goals: Patient/caregiver will verbalize understanding of skin care regimen Date Initiated: 08/15/2016 Target Resolution Date: 09/05/2016 Goal Status: Active Ulcer/skin breakdown will have a volume reduction of 30% by week 4 Date Initiated: 08/15/2016 Target Resolution Date: 09/05/2016 Goal Status: Active Ulcer/skin breakdown will have a volume reduction of 50% by week 8 Date Initiated: 08/15/2016 Target Resolution Date: 09/05/2016 Goal Status: Active Ulcer/skin breakdown will have a volume reduction of 80% by week 12 Date Initiated: 08/15/2016 Target Resolution Date: 09/05/2016 Goal Status: Active Ulcer/skin breakdown will heal within 14 weeks Date Initiated: 08/15/2016 Target Resolution Date: 09/05/2016 Goal Status: Active Interventions: Assess patient/caregiver ability to obtain necessary supplies Assess patient/caregiver ability to perform ulcer/skin care regimen upon admission and as needed Assess ulceration(s) every visit Provide education on smoking Provide education on ulcer and skin care Treatment Activities: Referred to DME Loray Akard for dressing supplies : 08/15/2016 Skin care regimen initiated : 08/15/2016 Topical wound management initiated : 08/15/2016 Notes: KAELEN, CAUGHLIN ([id number]) Electronic Signature(s) Signed: 10/31/2016 5:02:31 PM By: Alric Quan Entered By: Alric Quan on 10/31/2016 09:12:22 [name] ([id number]) -------------------------------------------------------------------------------- Pain Assessment Details Patient Name: [name]. Date of Service: 10/31/2016 8:45 AM Medical Record Number: [id number] Patient Account Number: [id number] Date of Birth/Sex: [birth year] (66 y.o. Male) Treating RN: Ahmed Prima Primary Care Naria Abbey: Jenna Luo Other Clinician: Referring Kaesyn Johnston: Jenna Luo Treating Kayn Haymore/Extender: Frann Rider in Treatment: 11 Active  Problems  Location of Pain Severity and Description of Pain Patient Has Paino No Site Locations With Dressing Change: No Pain Management and Medication Current Pain Management: Electronic Signature(s) Signed: 10/31/2016 5:02:31 PM By: Alric Quan Entered By: Alric Quan on 10/31/2016 [place] [name] ([id number]) -------------------------------------------------------------------------------- Patient/Caregiver Education Details Patient Name: MASTON, WIGHT. Date of Service: 10/31/2016 8:45 AM Medical Record Number: [id number] Patient Account Number: [id number] Date of Birth/Gender: [birth year] (66 y.o. Male) Treating RN: Ahmed Prima Primary Care Physician: Jenna Luo Other Clinician: Referring Physician: Jenna Luo Treating Physician/Extender: Frann Rider in Treatment: 11 Education Assessment Education Provided To: Patient Education Topics Provided Wound/Skin Impairment: Handouts: Other: change dressing as ordered Methods: Demonstration, Explain/Verbal Responses: State content correctly Electronic Signature(s) Signed: 10/31/2016 5:02:31 PM By: Alric Quan Entered By: Alric Quan on 10/31/2016 09:13:01 [name] ([id number]) -------------------------------------------------------------------------------- Wound Assessment Details Patient Name: [name]. Date of Service: 10/31/2016 8:45 AM Medical Record Number: [id number] Patient Account Number: [id number] Date of Birth/Sex: [birth year] (66 y.o. Male) Treating RN: Ahmed Prima Primary Care Nohely Whitehorn: Jenna Luo Other Clinician: Referring Donelle Baba: Jenna Luo Treating Kinnley Paulson/Extender: Frann Rider in Treatment: 11 Wound Status Wound Number: 1 Primary Diabetic Wound/Ulcer of the Lower Etiology: Extremity Wound Location: Right Lower Leg - Lateral Wound Open Wounding Event: Trauma Status: Date Acquired: 05/14/2016 Comorbid Chronic  Obstructive Pulmonary Disease Weeks Of Treatment: 11 History: (COPD), Arrhythmia, Hypertension, Clustered Wound: No Myocardial Infarction, Type II Diabetes Photos Photo Uploaded By: Alric Quan on 10/31/2016 15:43:36 Wound Measurements Length: (cm) 4.8 Width: (cm) 1.4 Depth: (cm) 0.1 Area: (cm) 5.278 Volume: (cm) 0.528 % Reduction in Area: 63.9% % Reduction in Volume: 63.9% Epithelialization: Small (1-33%) Tunneling: No Undermining: No Wound Description Classification: Grade 1 Wound Margin: Flat and Intact Exudate Amount: Large Exudate Type: Serosanguineous Exudate Color: red, brown Foul Odor After Cleansing: No Slough/Fibrino Yes Wound Bed Granulation Amount: Large (67-100%) Exposed Structure Granulation Quality: Pink, Pale Fascia Exposed: No Necrotic Amount: Small (1-33%) Fat Layer (Subcutaneous Tissue) Exposed: Yes Necrotic Quality: Adherent Slough Tendon Exposed: No TRIGG, DELAROCHA. ([id number]) Muscle Exposed: No Joint Exposed: No Bone Exposed: No Periwound Skin Texture Texture Color No Abnormalities Noted: No No Abnormalities Noted: No Callus: No Atrophie Blanche: No Crepitus: No Cyanosis: No Excoriation: Yes Ecchymosis: No Induration: No Erythema: No Rash: No Hemosiderin Staining: Yes Scarring: Yes Mottled: Yes Pallor: No Moisture Rubor: No No Abnormalities Noted: No Dry / Scaly: No Temperature / Pain Maceration: Yes Temperature: No Abnormality Tenderness on Palpation: Yes Wound Preparation Ulcer Cleansing: Rinsed/Irrigated with Saline Topical Anesthetic Applied: Other: lidocaine 4%, Treatment Notes Wound #1 (Right, Lateral Lower Leg) 1. Cleansed with: Clean wound with Normal Saline 2. Anesthetic Topical Lidocaine 4% cream to wound bed prior to debridement 3. Peri-wound Care: Skin Prep 4. Dressing Applied: Hydrogel Other dressing (specify in notes) 5. Secondary Dressing Applied Bordered Foam Dressing Dry  Gauze Notes cutimed sorbact and hydrogel Electronic Signature(s) Signed: 10/31/2016 5:02:31 PM By: Alric Quan Entered By: Alric Quan on 10/31/2016 09:07:50 [name] ([id number]) -------------------------------------------------------------------------------- [place] Details Patient Name: [name]. Date of Service: 10/31/2016 8:45 AM Medical Record Number: [id number] Patient Account Number: [id number] Date of Birth/Sex: 1950/12/03 (66 y.o. Male) Treating RN: Ahmed Prima Primary Care Yuna Pizzolato: Jenna Luo Other Clinician: Referring Shyla Gayheart: Jenna Luo Treating Yolando Gillum/Extender: Frann Rider in Treatment: 11 Vital Signs Time Taken: 09:01 Temperature (F): 98.1 Height (in): 72 Pulse (bpm): 68 Weight (lbs): 250 Respiratory Rate (breaths/min): 18 Body Mass Index (BMI): 33.9 Blood Pressure (mmHg): 144/75 Reference Range: 80 -  120 mg / dl Electronic Signature(s) Signed: 10/31/2016 5:02:31 PM By: Alric Quan Entered By: Alric Quan on 10/31/2016 09:03:00

## 2016-11-05 DIAGNOSIS — L570 Actinic keratosis: Secondary | ICD-10-CM | POA: Diagnosis not present

## 2016-11-05 DIAGNOSIS — L820 Inflamed seborrheic keratosis: Secondary | ICD-10-CM | POA: Diagnosis not present

## 2016-11-05 DIAGNOSIS — D229 Melanocytic nevi, unspecified: Secondary | ICD-10-CM | POA: Diagnosis not present

## 2016-11-05 DIAGNOSIS — D492 Neoplasm of unspecified behavior of bone, soft tissue, and skin: Secondary | ICD-10-CM | POA: Diagnosis not present

## 2016-11-05 DIAGNOSIS — L821 Other seborrheic keratosis: Secondary | ICD-10-CM | POA: Diagnosis not present

## 2016-11-07 ENCOUNTER — Ambulatory Visit: Payer: Commercial Managed Care - HMO | Admitting: Surgery

## 2016-11-07 ENCOUNTER — Ambulatory Visit (HOSPITAL_COMMUNITY): Payer: Medicare HMO

## 2016-11-08 ENCOUNTER — Encounter: Payer: Self-pay | Admitting: Vascular Surgery

## 2016-11-08 ENCOUNTER — Ambulatory Visit (INDEPENDENT_AMBULATORY_CARE_PROVIDER_SITE_OTHER): Payer: Medicare HMO | Admitting: Vascular Surgery

## 2016-11-08 VITALS — BP 158/87 | HR 56 | Temp 97.5°F | Resp 20 | Ht 70.0 in | Wt 261.0 lb

## 2016-11-08 DIAGNOSIS — I739 Peripheral vascular disease, unspecified: Secondary | ICD-10-CM | POA: Diagnosis not present

## 2016-11-08 DIAGNOSIS — I872 Venous insufficiency (chronic) (peripheral): Secondary | ICD-10-CM | POA: Diagnosis not present

## 2016-11-08 NOTE — Progress Notes (Signed)
Patient ID: Brad Taylor, male   DOB: 04/27/1951, 66 y.o.   MRN: [id number]  Reason for Consult: New Evaluation (Non-healing leg wound, venous studies here on 10/28/16.  Arterial performed at [facility] on 3/08 per Dr. Con Memos.)   Referred by [name], MD  Subjective:     HPI:  Brad Taylor is a 66 y.o. male presents for evaluation of right lateral leg ulceration. He is followed by the wound care center at [facility] and also at the [facility] for his vascular disease. He has a known history of a left brain stroke which she has residual right upper extremity and right lower extremity weakness with a limp. He also has a known abdominal aortic aneurysm which is followed for the patient is 3.8 cm although I do not have documentation of that. Pertinent to today's visit he has a right lateral leg ulceration that has been treated for the past 6 months. The ulcer initially presented after low-grade trauma from a trailer hitch. He states that it has been improving with the wound care center but this failed to completely heal. He has no history of DVT but does have chronic swelling of both of his legs. He is a chronic every day smoker for multiple years. He is also a diabetic but states that his diabetes is well-controlled via the [place] hospital. He denies fevers chills or constitutional symptoms. He does continue to walk without aid.   Past Medical History:  Diagnosis Date  . AAA (abdominal aortic aneurysm) ([place]) 5/15   3.5x3.5 cm  . COPD (chronic obstructive pulmonary disease) ([place])   . Depression   . Diabetes mellitus without complication ([place])   . GERD (gastroesophageal reflux disease)   . Hyperlipidemia   . Hypothyroidism   . Post traumatic stress disorder   . Stroke ([place])   . Thyroid disease   . Ulcer ([place])    Family History  Problem Relation Age of Onset  . Emphysema Father   . Thyroid disease Sister   . Stroke Maternal Grandmother   . Thyroid disease Sister   . Thyroid disease Sister     Past Surgical History:  Procedure Laterality Date  . CATARACT EXTRACTION    . CHOLECYSTECTOMY    . KNEE SURGERY      Short Social History:  Social History  Substance Use Topics  . Smoking status: Current Every Day Smoker    Packs/day: 1.00    Years: 30.00    Types: Cigarettes  . Smokeless tobacco: Former Systems developer    Quit date: 05/04/2013  . Alcohol use No     Comment: Former heavy drinker, been sober for 10 years    No Known Allergies  Current Outpatient Prescriptions  Medication Sig Dispense Refill  . atorvastatin (LIPITOR) 10 MG tablet Take 10 mg by mouth daily. Takes 5 mg    . buPROPion (WELLBUTRIN SR) 150 MG 12 hr tablet Take 1 tablet (150 mg total) by mouth daily. 30 tablet 1  . busPIRone (BUSPAR) 10 MG tablet Take 0.5 tablets (5 mg total) by mouth 2 (two) times daily as needed (for anxiety). 60 tablet 0  . clopidogrel (PLAVIX) 75 MG tablet Take 1 tablet (75 mg total) by mouth daily with breakfast. 90 tablet 3  . insulin glargine (LANTUS) 100 UNIT/ML injection 40 units every a.m. & 50 units qhs    . Insulin Pen Needle (PEN NEEDLES 5/16") 30G X 8 MM MISC Use with insulin pen daily DX:E11.9 100 each 5  .  levothyroxine (SYNTHROID, LEVOTHROID) 125 MCG tablet Take 1 tablet (125 mcg total) by mouth daily before breakfast. 30 tablet 1  . ondansetron (ZOFRAN) 4 MG tablet Take 1 tablet (4 mg total) by mouth every 8 (eight) hours as needed for nausea or vomiting. 20 tablet 0  . RABEprazole (ACIPHEX) 20 MG tablet Take 2 tablets (40 mg total) by mouth daily before breakfast. 30 tablet 1  . silver sulfADIAZINE (SILVADENE) 1 % cream Apply 1 application topically daily. 50 g 1  . traZODone (DESYREL) 100 MG tablet Take 3 tablets (300 mg total) by mouth at bedtime. 30 tablet 0  . venlafaxine XR (EFFEXOR-XR) 150 MG 24 hr capsule Take 1 capsule (150 mg total) by mouth daily with breakfast. 30 capsule 1   No current facility-administered medications for this visit.     Review of Systems    Constitutional:  Constitutional negative. HENT: HENT negative.  Eyes: Eyes negative.  Respiratory: Respiratory negative.  Cardiovascular: Positive for dyspnea with exertion and leg swelling.  GI: Gastrointestinal negative.  Musculoskeletal: Musculoskeletal negative.  Skin: Positive for wound.  Neurological: Positive for focal weakness.  Hematologic: Hematologic/lymphatic negative.  Psychiatric: Psychiatric negative.        Objective:  Objective   Vitals:   11/08/16 1027  BP: (!) 158/87  Pulse: (!) 56  Resp: 20  Temp: 97.5 F (36.4 C)  TempSrc: Oral  SpO2: 91%  Weight: 261 lb (118.4 kg)  Height: 5\' 10"  (1.778 m)   Body mass index is 37.45 kg/m.  Physical Exam  Constitutional: He is oriented to person, place, and time. He appears well-developed.  HENT:  Head: Normocephalic.  Eyes: Pupils are equal, round, and reactive to light.  Neck: Normal range of motion. Neck supple.  Cardiovascular: Normal rate.   Pulses:      Radial pulses are 2+ on the right side, and 2+ on the left side.       Femoral pulses are 1+ on the right side, and 1+ on the left side. Pulmonary/Chest: Effort normal.  Abdominal: Soft. He exhibits no mass.  Musculoskeletal: Normal range of motion. He exhibits edema.  Lymphadenopathy:    He has no cervical adenopathy.  Neurological: He is alert and oriented to person, place, and time.  Skin:  Changes of ble consistent with c4 venous disease Right leg lateral ulceration, fibrinous exudate at base  Psychiatric: He has a normal mood and affect. His behavior is normal. Judgment and thought content normal.    Data: Lower extremity venous duplex reflux evaluation demonstrated reflux throughout his greater saphenous vein maximum diameter 0.67 cm at the saphenofemoral junction.  Outside ABIs demonstrate ABI right 0.8 left 0.99. I independently reviewed these waveforms today which demonstrate monophasic on the right and at least biphasic on the left.      Assessment/Plan:    66 year old male with known abdominal aortic aneurysm, left-sided stroke, smoker, diabetic followed at [facility] for his vascular disease. He has evidence of C4 venous disease with reflux in his right greater saphenous vein that is large the saphenofemoral junction but quickly get small. He also has monophasic ABI on the right side with ABI 0.8 but likely is over exaggerated. I discussed with him the importance of diabetes control, smoking cessation, wearing his compression stockings which he does, continued wound Center treatments which she is compliant with, and the likely need for endovascular intervention of his arterial disease. We will schedule aortogram with bilateral lower extremity runoff and possible intervention on the right. I  do not think that his venous reflux should be treated given the small size of the vein in the distal leg and the nature of this ulcer being from trauma and lateral in the leg. Hopefully with improving his arterial inflow we can get this healed and he will also need to quit smoking. We discussed the risk and benefits of angiogram today he demonstrates good understanding and we will schedule for the near future.     Waynetta Sandy MD [facility]

## 2016-11-11 ENCOUNTER — Encounter: Payer: Self-pay | Admitting: Nurse Practitioner

## 2016-11-14 ENCOUNTER — Encounter: Payer: Medicare HMO | Attending: Surgery | Admitting: Surgery

## 2016-11-14 DIAGNOSIS — Z794 Long term (current) use of insulin: Secondary | ICD-10-CM | POA: Diagnosis not present

## 2016-11-14 DIAGNOSIS — I872 Venous insufficiency (chronic) (peripheral): Secondary | ICD-10-CM | POA: Insufficient documentation

## 2016-11-14 DIAGNOSIS — I10 Essential (primary) hypertension: Secondary | ICD-10-CM | POA: Insufficient documentation

## 2016-11-14 DIAGNOSIS — J449 Chronic obstructive pulmonary disease, unspecified: Secondary | ICD-10-CM | POA: Insufficient documentation

## 2016-11-14 DIAGNOSIS — E11622 Type 2 diabetes mellitus with other skin ulcer: Secondary | ICD-10-CM | POA: Insufficient documentation

## 2016-11-14 DIAGNOSIS — I739 Peripheral vascular disease, unspecified: Secondary | ICD-10-CM | POA: Insufficient documentation

## 2016-11-14 DIAGNOSIS — F17218 Nicotine dependence, cigarettes, with other nicotine-induced disorders: Secondary | ICD-10-CM | POA: Insufficient documentation

## 2016-11-14 DIAGNOSIS — I252 Old myocardial infarction: Secondary | ICD-10-CM | POA: Insufficient documentation

## 2016-11-14 DIAGNOSIS — E039 Hypothyroidism, unspecified: Secondary | ICD-10-CM | POA: Diagnosis not present

## 2016-11-14 DIAGNOSIS — Z8673 Personal history of transient ischemic attack (TIA), and cerebral infarction without residual deficits: Secondary | ICD-10-CM | POA: Diagnosis not present

## 2016-11-14 DIAGNOSIS — I70232 Atherosclerosis of native arteries of right leg with ulceration of calf: Secondary | ICD-10-CM | POA: Diagnosis not present

## 2016-11-14 DIAGNOSIS — L97212 Non-pressure chronic ulcer of right calf with fat layer exposed: Secondary | ICD-10-CM | POA: Diagnosis not present

## 2016-11-14 DIAGNOSIS — Z79899 Other long term (current) drug therapy: Secondary | ICD-10-CM | POA: Diagnosis not present

## 2016-11-15 NOTE — Progress Notes (Addendum)
Brad, Taylor ([id number]) Visit Report for 11/14/2016 Chief Complaint Document Details Patient Name: Brad Taylor, Brad Taylor. Date of Service: 11/14/2016 8:45 AM Medical Record Number: [id number] Patient Account Number: [id number] Date of Birth/Sex: [birth year] (66 y.o. Male) Treating RN: Ahmed Prima Primary Care Provider: Jenna Luo Other Clinician: Referring Provider: Jenna Luo Treating Provider/Extender: Frann Rider in Treatment: 13 Information Obtained from: Patient Chief Complaint Patient presents for treatment of an open ulcer due to venous insufficiency along with diabetes mellitus, to his right lower extremity which she's had for about 3 months Electronic Signature(s) Signed: 11/14/2016 9:34:44 AM By: Christin Fudge MD, FACS Entered By: Christin Fudge on 11/14/2016 09:34:44 Gladis Taylor ([id number]) -------------------------------------------------------------------------------- Debridement Details Patient Name: Gladis Taylor. Date of Service: 11/14/2016 8:45 AM Medical Record Number: [id number] Patient Account Number: [id number] Date of Birth/Sex: Sep 01, 1950 (66 y.o. Male) Treating RN: Ahmed Prima Primary Care Provider: Jenna Luo Other Clinician: Referring Provider: Jenna Luo Treating Provider/Extender: Frann Rider in Treatment: 13 Debridement Performed for Wound #1 Right,Lateral Lower Leg Assessment: Performed By: Physician Christin Fudge, MD Debridement: Debridement Pre-procedure Yes - 09:19 Verification/Time Out Taken: Start Time: 09:20 Pain Control: Lidocaine 4% Topical Solution Level: Skin/Subcutaneous Tissue Total Area Debrided (L x 5 (cm) x 1.5 (cm) = 7.5 (cm) W): Tissue and other Exudate, Fibrin/Slough, Subcutaneous material debrided: Instrument: Curette Bleeding: Minimum Hemostasis Achieved: Pressure End Time: 09:22 Procedural Pain: 0 Post Procedural Pain: 0 Response to Treatment: Procedure was tolerated  well Post Debridement Measurements of Total Wound Length: (cm) 5 Width: (cm) 1.5 Depth: (cm) 0.1 Volume: (cm) 0.589 Character of Wound/Ulcer Post Requires Further Debridement Debridement: Severity of Tissue Post Debridement: Fat layer exposed Post Procedure Diagnosis Same as Pre-procedure Electronic Signature(s) Signed: 11/14/2016 9:34:11 AM By: Christin Fudge MD, FACS Signed: 11/14/2016 4:33:22 PM By: Alric Quan Previous Signature: 11/14/2016 9:33:48 AM Version By: Christin Fudge MD, FACS Previous Signature: 11/14/2016 9:33:33 AM Version By: Christin Fudge MD, FACS DAMACIO, WEISGERBER ([id number]) Entered By: Christin Fudge on 11/14/2016 [place] ([id number]) -------------------------------------------------------------------------------- HPI Details Patient Name: Brad, Taylor. Date of Service: 11/14/2016 8:45 AM Medical Record Number: [id number] Patient Account Number: [id number] Date of Birth/Sex: 1950/09/01 (66 y.o. Male) Treating RN: Ahmed Prima Primary Care Provider: Jenna Luo Other Clinician: Referring Provider: Jenna Luo Treating Provider/Extender: Frann Rider in Treatment: 13 History of Present Illness Location: right lower extremity Quality: Patient reports experiencing a dull pain to affected area(s). Severity: Patient states wound are getting worse. Duration: Patient has had the wound for > 3 months prior to seeking treatment at the wound center Timing: Pain in wound is Intermittent (comes and goes Context: The wound occurred when the patient had a lacerated wound which was treated with sutures which gaped and had an open ulceration Modifying Factors: Other treatment(s) tried include:been having on and Unna's boots applied by his PCP in the office Associated Signs and Symptoms: Patient reports having increase swelling. HPI Description: 66 year old gentleman with a past medical history of stroke, diabetes mellitus2  with hyperglycemia, CVA, hemiplegia, ahasia, cellulitis of the left gluteal area and is a smoker and smokes a packet of cigarettes a day. His past medical history also significant for COPD, thyroid disease and is status post cholecystectomy and status post knee surgery in October 2017 the patient had a laceration to his right lateral shin and had a deep tear which was sutured with 4-0 Ethilon sutures but these opened out because of his chronic venous insufficiency and severe edema and the patient was  seen by his PCP and put on Bactrim. He was also advised to do dressings with Silvadene and Unna's boots. We don't have notes from the [facility] in [place] but he says he's had a thorough workup done both arterial and venous and was told that he has venous insufficiency and was to wear compression stockings of 30-40 mm variety. We'll try and obtain these notes. He also gives a history of a lung mass which was a cancer and treated with resection but no chemotherapy or radiation. 08/29/2016 -- not yet received any of his notes from the [facility] 09/05/2016 -- in spite of every effort on the patient's part and are part we have not been able to get any reports regarding his arterial and venous workup. At this stage I'm going to request fresh arterial duplex studies and venous reflux studies. 09/12/2016 -- the patient has not yet got his arterial and venous duplex study reports and refuses to do these tests again. He says he will get the reports back in about 2 weeks' time. He also says he has had a lot of pain and this lower extremity using Hydrofera Blue and asks to change the dressing material. 09/19/2016 -- patient's wife did talk to the doctor at the [place] and they will send as the old report of arterial and venous duplex studies and if need be they will repeat it again. Rash request will be sent by [place]. 09/26/2016 -- my office has sent the requests for arterial venous duplex studies to the [facility] and they have received it and the patient is waiting back for an appointment. October 03 2016 -- Notes reviewed from the [facility] were reviewed and in June 2016 he was scheduled to have a right upper lobe lung surgery by [facility] and I presume this was done as planned. A note from June 2017 stated that he was status post right upper lobectomy with mediastinal lymphadenectomy for a T2 Arduini, Sheffield L. ([id number]) N0 MX well-differentiated squamous cell carcinoma. After the multidisciplinary oncology conference he was recommended surveillance protocol. We also received some other notes which the patient has brought in today form November 2013, he had normal ABIs on 06/11/2012. Right was 0.9 to left was 1.12. Toe brachial index was 0.78 on the right and left was 0.67. He had a diagnosis of venous insufficiency and at that stage Unnas boots were applied. There are no recent notes from either arterial or venous duplex studies done since then. In discussing the previous workup and discussing the fact that he required a arterial duplex and a venous reflux study to be done, the patient suddenly started showing signs of being aggressive and rude and was being very aggressive in his conversation about what he wanted to and what he did not want to do. At this stage I had to firmly tell him to set up boundaries and not talk rudedly to either my staff or me and to not to threaten [place] with physical abuse. He calmed down for a while and agreed to get his workup done. 10/17/2016 -- he had an ABI examination of both lower extremities done at the [facility] and his right ABI was 0.80 and the left was 0.99. his venous duplex for reflux is to be done on March 26 10/24/2016 -- the patient says he did not tolerate the 3 layer compression and removed it and what his own compression stockings. His venous duplex study for reflux is scheduled for next  Monday. 10/31/2016 -- the patient had a lower  extremity venous duplex to reflux evaluation done on 10/28/2016 which showed venous incompetence in the right saphenofemoral junction, greater saphenous vein, small saphenous vein and common femoral vein. These results I have strongly recommended an appointment to see the vascular surgeons at the office to see for endovenous ablation is a possibility. 11/14/2016 -- was seen by Dr. Siri Cole -- he reviewed his history and the venous and arterial data. The assessment was that of C4 venous disease with reflux in his right greater saphenous vein. He also has a monophasic ABI on the right side, with ABI of 0.8 but likely is over exaggerated. He discussed the need for endovascular interventions of his arterial disease. He scheduled him for a aortogram with bilateral lower extremity runoff and possible intervention on the right. He did not think that his venous reflux needed be treated given the small size of the vein in the distal leg and the nature of the ulcer being from trauma and lateral to the leg. Aortogram with runoff has been planned for the near future. discussed the above report with the patient and his wife and they have had all questions answered. The patient is now going to decide whether he is going to have the procedure done at the [place] or with Dr. [name] in [place] Signature(s) Signed: 11/14/2016 9:35:21 AM By: Christin Fudge MD, FACS Previous Signature: 11/14/2016 9:32:48 AM Version By: Christin Fudge MD, FACS Previous Signature: 11/14/2016 9:16:47 AM Version By: Christin Fudge MD, FACS Entered By: Christin Fudge on 11/14/2016 09:35:20 Gladis Taylor ([id number]) -------------------------------------------------------------------------------- Physical Exam Details Patient Name: NIKOLAJ, GERAGHTY. Date of Service: 11/14/2016 8:45 AM Medical Record Number: [id number] Patient Account Number: [id number] Date of Birth/Sex: [birth year] (66 y.o. Male) Treating RN: Ahmed Prima Primary Care Provider: Jenna Luo Other Clinician: Referring Provider: Jenna Luo Treating Provider/Extender: Frann Rider in Treatment: 13 Constitutional . Pulse regular. Respirations normal and unlabored. Afebrile. . Eyes Nonicteric. Reactive to light. Ears, Nose, Mouth, and Throat Lips, teeth, and gums WNL.Marland Kitchen Moist mucosa without lesions. Neck supple and nontender. No palpable supraclavicular or cervical adenopathy. Normal sized without goiter. Respiratory WNL. No retractions.. Breath sounds WNL, No rubs, rales, rhonchi, or wheeze.. Cardiovascular Heart rhythm and rate regular, no murmur or gallop.. Pedal Pulses WNL. No clubbing, cyanosis or edema. Chest Breasts symmetical and no nipple discharge.. Breast tissue WNL, no masses, lumps, or tenderness.. Lymphatic No adneopathy. No adenopathy. No adenopathy. Musculoskeletal Adexa without tenderness or enlargement.. Digits and nails w/o clubbing, cyanosis, infection, petechiae, ischemia, or inflammatory conditions.. Integumentary (Hair, Skin) No suspicious lesions. No crepitus or fluctuance. No peri-wound warmth or erythema. No masses.Marland Kitchen Psychiatric Judgement and insight Intact.. No evidence of depression, anxiety, or agitation.. Notes the lymphedema is moderately controlled and there is significant amount of slough which are sharply removed with a #3 curet and bleeding was brisk but controlled with pressure Electronic Signature(s) Signed: 11/14/2016 9:35:43 AM By: Christin Fudge MD, FACS Entered By: Christin Fudge on 11/14/2016 09:35:42 Gladis Taylor ([id number]) -------------------------------------------------------------------------------- Physician Orders Details Patient Name: KHALEED, HOLAN L. Date of Service: 11/14/2016 8:45 AM Medical Record Number: [id number] Patient Account Number: [id number] Date of Birth/Sex: [birth year] (66 y.o. Male) Treating RN: Ahmed Prima Primary Care Provider: Jenna Luo Other Clinician: Referring Provider: Jenna Luo Treating Provider/Extender: Frann Rider in Treatment: 42 Verbal / Phone Orders: Yes Clinician: Carolyne Fiscal, Debi Read Back and Verified: Yes Diagnosis Coding Wound Cleansing Wound #1 Right,Lateral Lower Leg o  Cleanse wound with mild soap and water - in clinic Anesthetic Wound #1 Right,Lateral Lower Leg o Topical Lidocaine 4% cream applied to wound bed prior to debridement Skin Barriers/Peri-Wound Care Wound #1 Right,Lateral Lower Leg o Skin Prep Primary Wound Dressing Wound #1 Right,Lateral Lower Leg o Other: - hydrogel and sorbact Secondary Dressing Wound #1 Right,Lateral Lower Leg o Dry Gauze o Boardered Foam Dressing Dressing Change Frequency o Change dressing every other day. Follow-up Appointments Wound #1 Right,Lateral Lower Leg o Return Appointment in 2 weeks. Edema Control Wound #1 Right,Lateral Lower Leg o Elevate legs to the level of the heart and pump ankles as often as possible Additional Orders / Instructions Wound #1 Right,Lateral Lower Leg o Stop Smoking Richart, Darly L. ([id number]) o Increase protein intake. o Activity as tolerated o Other: - ZINC, VIT A, VIT C, MVI INCLUDE THESE IN YOU DIET Electronic Signature(s) Signed: 11/14/2016 2:17:24 PM By: Christin Fudge MD, FACS Signed: 11/14/2016 4:33:22 PM By: Alric Quan Entered By: Alric Quan on 11/14/2016 09:22:15 Gladis Taylor ([id number]) -------------------------------------------------------------------------------- Problem List Details Patient Name: WILKIE, ZENON. Date of Service: 11/14/2016 8:45 AM Medical Record Number: [id number] Patient Account Number: [id number] Date of Birth/Sex: [birth year] (66 y.o. 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 Date of Service: 11/14/2016 8:45 AM Medical Record Number: [id number] Patient Account Number: [id number] Date of Birth/Sex: June 26, 1951 (66 y.o. Male) Treating RN: Ahmed Prima Primary Care Provider: Jenna Luo Other Clinician: Referring Provider: Jenna Luo Treating Provider/Extender: Frann Rider in Treatment: 13 Subjective Chief Complaint Information obtained from Patient Patient presents for treatment of an open ulcer due to venous insufficiency along with diabetes mellitus, to his right lower extremity which she's had for about 3 months History of Present Illness (HPI) The following HPI elements were documented for the patient's wound: Location: right lower extremity Quality: Patient reports experiencing a dull pain to affected area(s). Severity: Patient states wound are getting worse. Duration: Patient has had the wound for > 3 months prior to seeking treatment at the wound center Timing: Pain in wound is Intermittent (comes and goes Context: The wound occurred when the patient had a lacerated wound which was treated with sutures which gaped and had an open ulceration Modifying Factors:  Other treatment(s) tried include:been having on and Unna's boots applied by his PCP in the office Associated Signs and Symptoms: Patient reports having increase swelling. 66 year old gentleman with a past medical history of stroke, diabetes mellitus2 with hyperglycemia, CVA, hemiplegia, ahasia, cellulitis of the left gluteal area and is a smoker and smokes a packet of cigarettes a day. His past medical history also significant for COPD, thyroid disease and is status post cholecystectomy and status post knee surgery in October 2017 the patient had a laceration to his right lateral shin and had a deep tear which was sutured with 4-0 Ethilon sutures but these  opened out because of his chronic venous insufficiency and severe edema and the patient was seen by his PCP and put on Bactrim. He was also advised to do dressings with Silvadene and Unna's boots. We don't have notes from the [facility] in [place] but he says he's had a thorough workup done both arterial and venous and was told that he has venous insufficiency and was to wear compression stockings of 30-40 mm variety. We'll try and obtain these notes. He also gives a history of a lung mass which was a cancer and treated with resection but no chemotherapy or radiation. 08/29/2016 -- not yet received any of his notes from the [facility] 09/05/2016 -- in spite of every effort on the patient's part and are part we have not been able to get any reports regarding his arterial and venous workup. At this stage I'm going to request fresh arterial duplex studies and venous reflux studies. 09/12/2016 -- the patient has not yet got his arterial and venous duplex study reports and refuses to do these tests again. He says he will get the reports back in about 2 weeks' time. He also says he has had a MYCHAEL, SMOCK. ([id number]) lot of pain and this lower extremity using Hydrofera Blue and asks to change the dressing material. 09/19/2016 -- patient's  wife did talk to the doctor at the [place] and they will send as the old report of arterial and venous duplex studies and if need be they will repeat it again. Rash request will be sent by [place]. 09/26/2016 -- my office has sent the requests for arterial venous duplex studies to the [facility] and they have received it and the patient is waiting back for an appointment. October 03 2016 -- Notes reviewed from the [facility] were reviewed and in June 2016 he was scheduled to have a right upper lobe lung surgery by [facility] and I presume this was done as planned. A note from June 2017 stated that he was status post right upper lobectomy with mediastinal lymphadenectomy for a T2 N0 MX well-differentiated squamous cell carcinoma. After the multidisciplinary oncology conference he was recommended surveillance protocol. We also received some other notes which the patient has brought in today form November 2013, he had normal ABIs on 06/11/2012. Right was 0.9 to left was 1.12. Toe brachial index was 0.78 on the right and left was 0.67. He had a diagnosis of venous insufficiency and at that stage Unnas boots were applied. There are no recent notes from either arterial or venous duplex studies done since then. In discussing the previous workup and discussing the fact that he required a arterial duplex and a venous reflux study to be done, the patient suddenly started showing signs of being aggressive and rude and was being very aggressive in his conversation about what he wanted to and what he did not want to do. At this stage I had to firmly tell him to set up boundaries and not talk rudedly to either my staff or me and to not to threaten [place] with physical abuse. He calmed down for a while and agreed to get his workup done. 10/17/2016 -- he had an ABI examination of both lower extremities done at the [facility] and his right ABI was 0.80 and the left was 0.99. his venous duplex for reflux is to be done on  March 26 10/24/2016 -- the patient says he did not tolerate the 3 layer compression and removed it  and what his own compression stockings. His venous duplex study for reflux is scheduled for next Monday. 10/31/2016 -- the patient had a lower extremity venous duplex to reflux evaluation done on 10/28/2016 which showed venous incompetence in the right saphenofemoral junction, greater saphenous vein, small saphenous vein and common femoral vein. These results I have strongly recommended an appointment to see the vascular surgeons at the office to see for endovenous ablation is a possibility. 11/14/2016 -- was seen by Dr. Siri Cole -- he reviewed his history and the venous and arterial data. The assessment was that of C4 venous disease with reflux in his right greater saphenous vein. He also has a monophasic ABI on the right side, with ABI of 0.8 but likely is over exaggerated. He discussed the need for endovascular interventions of his arterial disease. He scheduled him for a aortogram with bilateral lower extremity runoff and possible intervention on the right. He did not think that his venous reflux needed be treated given the small size of the vein in the distal leg and the nature of the ulcer being from trauma and lateral to the leg. Aortogram with runoff has been planned for the near future. discussed the above report with the patient and his wife and they have had all questions answered. The patient is now going to decide whether he is going to have the procedure done at the [place] or with Dr. [name] in [place], [place]. ([id number]) Objective Constitutional Pulse regular. Respirations normal and unlabored. Afebrile. Vitals Time Taken: 8:53 AM, Height: 72 in, Weight: 250 lbs, BMI: 33.9, Temperature: 97.8 F, Pulse: 50 bpm, Respiratory Rate: 18 breaths/min, Blood Pressure: 142/73 mmHg. Eyes Nonicteric. Reactive to light. Ears, Nose, Mouth, and Throat Lips, teeth, and gums WNL.Marland Kitchen  Moist mucosa without lesions. Neck supple and nontender. No palpable supraclavicular or cervical adenopathy. Normal sized without goiter. Respiratory WNL. No retractions.. Breath sounds WNL, No rubs, rales, rhonchi, or wheeze.. Cardiovascular Heart rhythm and rate regular, no murmur or gallop.. Pedal Pulses WNL. No clubbing, cyanosis or edema. Chest Breasts symmetical and no nipple discharge.. Breast tissue WNL, no masses, lumps, or tenderness.. Lymphatic No adneopathy. No adenopathy. No adenopathy. Musculoskeletal Adexa without tenderness or enlargement.. Digits and nails w/o clubbing, cyanosis, infection, petechiae, ischemia, or inflammatory conditions.Marland Kitchen Psychiatric Judgement and insight Intact.. No evidence of depression, anxiety, or agitation.. General Notes: the lymphedema is moderately controlled and there is significant amount of slough which are sharply removed with a #3 curet and bleeding was brisk but controlled with pressure Integumentary (Hair, Skin) No suspicious lesions. No crepitus or fluctuance. No peri-wound warmth or erythema. No masses.. Wound #1 status is Open. Original cause of wound was Trauma. The wound is located on the Right,Lateral Lower Leg. The wound measures 5cm length x 1.5cm width x 0.1cm depth; 5.89cm^2 area and 0.589cm^3 volume. There is Fat Layer (Subcutaneous Tissue) Exposed exposed. There is no tunneling or undermining noted. There is a large amount of serosanguineous drainage noted. The wound margin is flat and intact. There is small (1-33%) pink, pale granulation within the wound bed. There is a large (67-100%) amount of Rathel, Esperanza ([id number]) necrotic tissue within the wound bed including Adherent Slough. The periwound skin appearance exhibited: Excoriation, Scarring, Maceration, Hemosiderin Staining, Mottled. The periwound skin appearance did not exhibit: Callus, Crepitus, Induration, Rash, Dry/Scaly, Atrophie Blanche, Cyanosis, Ecchymosis,  Pallor, Rubor, Erythema. Periwound temperature was noted as No Abnormality. The periwound has tenderness on palpation. Assessment Active Problems ICD-10 E11.622 - Type 2  diabetes mellitus with other skin ulcer L97.212 - Non-pressure chronic ulcer of right calf with fat layer exposed I87.2 - Venous insufficiency (chronic) (peripheral) F17.218 - Nicotine dependence, cigarettes, with other nicotine-induced disorders I73.9 - Peripheral vascular disease, unspecified Procedures Wound #1 Wound #1 is a Diabetic Wound/Ulcer of the Lower Extremity located on the Right,Lateral Lower Leg . There was a Skin/Subcutaneous Tissue Debridement ([id number]) debridement with total area of 7.5 sq cm performed by Christin Fudge, MD. with the following instrument(s): Curette including Exudate, Fibrin/Slough, and Subcutaneous after achieving pain control using Lidocaine 4% Topical Solution. A time out was conducted at 09:19, prior to the start of the procedure. A Minimum amount of bleeding was controlled with Pressure. The procedure was tolerated well with a pain level of 0 throughout and a pain level of 0 following the procedure. Post Debridement Measurements: 5cm length x 1.5cm width x 0.1cm depth; 0.589cm^3 volume. Character of Wound/Ulcer Post Debridement requires further debridement. Severity of Tissue Post Debridement is: Fat layer exposed. Post procedure Diagnosis Wound #1: Same as Pre-Procedure Plan Wound Cleansing: Wound #1 Right,Lateral Lower Leg: YUEPHENG, SCHALLER. ([id number]) Cleanse wound with mild soap and water - in clinic Anesthetic: Wound #1 Right,Lateral Lower Leg: Topical Lidocaine 4% cream applied to wound bed prior to debridement Skin Barriers/Peri-Wound Care: Wound #1 Right,Lateral Lower Leg: Skin Prep Primary Wound Dressing: Wound #1 Right,Lateral Lower Leg: Other: - hydrogel and sorbact Secondary Dressing: Wound #1 Right,Lateral Lower Leg: Dry Gauze Boardered Foam  Dressing Dressing Change Frequency: Change dressing every other day. Follow-up Appointments: Wound #1 Right,Lateral Lower Leg: Return Appointment in 2 weeks. Edema Control: Wound #1 Right,Lateral Lower Leg: Elevate legs to the level of the heart and pump ankles as often as possible Additional Orders / Instructions: Wound #1 Right,Lateral Lower Leg: Stop Smoking Increase protein intake. Activity as tolerated Other: - ZINC, VIT A, VIT C, MVI INCLUDE THESE IN YOU DIET the patient has a mixed arterial and venous disease and the vascular surgeon would like to proceed with an aortogram and intervention once the patient's decides where he wants to get it done. The venous disease is to be treated with compression and local care. I have recommended: 1. Hydrogel with Sorbact with a 3 layer Profore -- The patient is not compliant with wearing this and wants to use his own compression stockings. 2. Good control of his diabetes mellitus and I have asked him to see his PCP for further care 3. elevation and exercise 4. Adequate protein, vitamin A, vitamin C and zinc 5. Regular visits to the wound center he and his wife have read all questions answered and will be compliant. they are finding it difficult to come here every week and would like to come every's 2 weeks CORBETT, MOULDER ([id number]) Electronic Signature(s) Signed: 11/14/2016 9:36:50 AM By: Christin Fudge MD, FACS Entered By: Christin Fudge on 11/14/2016 09:36:50 [place], Eric Form ([id number]) -------------------------------------------------------------------------------- SuperBill Details Patient Name: Gladis Taylor. Date of Service: 11/14/2016 Medical Record Number: [id number] Patient Account Number: [id number] Date of Birth/Sex: 1950-10-23 (66 y.o. Male) Treating RN: Carolyne Fiscal, Debi Primary Care Provider: Jenna Luo Other Clinician: Referring Provider: Jenna Luo Treating Provider/Extender: Frann Rider in  Treatment: 13 Diagnosis Coding ICD-10 Codes Code Description E11.622 Type 2 diabetes mellitus with other skin ulcer L97.212 Non-pressure chronic ulcer of right calf with fat layer exposed I87.2 Venous insufficiency (chronic) (peripheral) F17.218 Nicotine dependence, cigarettes, with other nicotine-induced disorders I73.9 Peripheral vascular disease, unspecified Facility Procedures CPT4 Code: 67124580 Description: 99833 -  DEB SUBQ TISSUE 20 SQ CM/< ICD-10 Description Diagnosis E11.622 Type 2 diabetes mellitus with other skin ulcer L97.212 Non-pressure chronic ulcer of right calf with fat I87.2 Venous insufficiency (chronic) (peripheral) I73.9  Peripheral vascular disease, unspecified Modifier: layer exposed Quantity: 1 Physician Procedures CPT4 Code: 9767341 Description: 93790 - WC PHYS LEVEL 3 - EST PT ICD-10 Description Diagnosis E11.622 Type 2 diabetes mellitus with other skin ulcer L97.212 Non-pressure chronic ulcer of right calf with fat I87.2 Venous insufficiency (chronic) (peripheral) I73.9 Peripheral  vascular disease, unspecified Modifier: 25 layer exposed Quantity: 1 CPT4 Code: 2409735 Dowlen, WALT Description: 11042 - WC PHYS SUBQ TISS 20 SQ CM ICD-10 Description Diagnosis E11.622 Type 2 diabetes mellitus with other skin ulcer L97.212 Non-pressure chronic ulcer of right calf with fat I87.2 Venous insufficiency (chronic) (peripheral) ER L.  ([id number]) Modifier: layer exposed Quantity: 1 Electronic Signature(s) Signed: 11/14/2016 9:37:14 AM By: Christin Fudge MD, FACS Entered By: Christin Fudge on 11/14/2016 09:37:14

## 2016-11-16 NOTE — Progress Notes (Signed)
DARROW, BARREIRO ([id number]) Visit Report for 11/14/2016 Arrival Information Details Patient Name: Brad Taylor, Brad Taylor. Date of Service: 11/14/2016 8:45 AM Medical Record Number: [id number] Patient Account Number: [id number] Date of Birth/Sex: 28-Mar-1951 (66 y.o. Male) Treating RN: Ahmed Prima Primary Care Marek Nghiem: Jenna Luo Other Clinician: Referring Asiah Befort: Jenna Luo Treating Alondra Vandeven/Extender: Frann Rider in Treatment: 13 Visit Information History Since Last Visit All ordered tests and consults were completed: No Patient Arrived: Ambulatory Added or deleted any medications: No Arrival Time: 08:45 Any new allergies or adverse reactions: No Accompanied By: wife Had a fall or experienced change in No Transfer Assistance: None activities of daily living that may affect Patient Identification Verified: Yes risk of falls: Secondary Verification Process Yes Signs or symptoms of abuse/neglect since last No Completed: visito Patient Requires Transmission- No Hospitalized since last visit: No Based Precautions: Has Dressing in Place as Prescribed: Yes Patient Has Alerts: Yes Pain Present Now: No Patient Alerts: Hgb A1c 8.4 10/10/2016 [facility] ABI: L) 0.99 R) 0.80 Electronic Signature(s) Signed: 11/14/2016 4:33:22 PM By: Alric Quan Entered By: Alric Quan on 11/14/2016 08:53:21 Brad Taylor, Brad Taylor ([id number]) -------------------------------------------------------------------------------- Encounter Discharge Information Details Patient Name: Brad Taylor. Date of Service: 11/14/2016 8:45 AM Medical Record Number: [id number] Patient Account Number: [id number] Date of Birth/Sex: 12-22-1950 (66 y.o. Male) Treating RN: Ahmed Prima Primary Care Shasha Buchbinder: Jenna Luo Other Clinician: Referring Garlin Batdorf: Jenna Luo Treating Damia Bobrowski/Extender: Frann Rider in Treatment: 63 Encounter Discharge Information Items Discharge Pain Level:  0 Discharge Condition: Stable Ambulatory Status: Ambulatory Discharge Destination: Home Transportation: Private Auto Accompanied By: wife Schedule Follow-up Appointment: Yes Medication Reconciliation completed and provided to Patient/Care No Hodaya Curto: Provided on Clinical Summary of Care: 11/14/2016 Taylor Type Recipient Paper Patient [employer] Electronic Signature(s) Signed: 11/14/2016 9:30:21 AM By: Ruthine Dose Entered By: Ruthine Dose on 11/14/2016 09:30:20 Brad Taylor ([id number]) -------------------------------------------------------------------------------- Lower Extremity Assessment Details Patient Name: Brad Taylor. Date of Service: 11/14/2016 8:45 AM Medical Record Number: [id number] Patient Account Number: [id number] Date of Birth/Sex: 26-Jun-1951 (66 y.o. Male) Treating RN: Ahmed Prima Primary Care Marshia Tropea: Jenna Luo Other Clinician: Referring Mckensie Scotti: Jenna Luo Treating Nazim Kadlec/Extender: Frann Rider in Treatment: 13 Vascular Assessment Pulses: Dorsalis Pedis Palpable: [Right:Yes] Posterior Tibial Extremity colors, hair growth, and conditions: Extremity Color: [Right:Hyperpigmented] Temperature of Extremity: [Right:Warm] Capillary Refill: [Right:< 3 seconds] Electronic Signature(s) Signed: 11/14/2016 4:33:22 PM By: Alric Quan Entered By: Alric Quan on 11/14/2016 09:10:35 Brad Taylor ([id number]) -------------------------------------------------------------------------------- Multi Wound Chart Details Patient Name: Brad Taylor. Date of Service: 11/14/2016 8:45 AM Medical Record Number: [id number] Patient Account Number: [id number] Date of Birth/Sex: 1951/03/11 (66 y.o. Male) Treating RN: Ahmed Prima Primary Care Malayjah Otoole: Jenna Luo Other Clinician: Referring Deadrian Toya: Jenna Luo Treating Adel Neyer/Extender: Frann Rider in Treatment: 13 Vital Signs Height(in): 72 Pulse(bpm):  50 Weight(lbs): 250 Blood Pressure 142/73 (mmHg): Body Mass Index(BMI): 34 Temperature(F): 97.8 Respiratory Rate 18 (breaths/min): Photos: [1:No Photos] [N/A:N/A] Wound Location: [1:Right Lower Leg - Lateral] [N/A:N/A] Wounding Event: [1:Trauma] [N/A:N/A] Primary Etiology: [1:Diabetic Wound/Ulcer of the Lower Extremity] [N/A:N/A] Comorbid History: [1:Chronic Obstructive Pulmonary Disease (COPD), Arrhythmia, Hypertension, Myocardial Infarction, Type II Diabetes] [N/A:N/A] Date Acquired: [1:05/14/2016] [N/A:N/A] Weeks of Treatment: [1:13] [N/A:N/A] Wound Status: [1:Open] [N/A:N/A] Measurements L x W x D 5x1.5x0.1 [N/A:N/A] (cm) Area (cm) : [1:5.89] [N/A:N/A] Volume (cm) : [1:0.589] [N/A:N/A] % Reduction in Area: [1:59.70%] [N/A:N/A] % Reduction in Volume: 59.70% [N/A:N/A] Classification: [1:Grade 1] [N/A:N/A] Exudate Amount: [1:Large] [N/A:N/A] Exudate Type: [1:Serosanguineous] [N/A:N/A] Exudate Color: [1:red, brown] [N/A:N/A] Wound Margin: [1:Flat and  Intact] [N/A:N/A] Granulation Amount: [1:Small (1-33%)] [N/A:N/A] Granulation Quality: [1:Pink, Pale] [N/A:N/A] Necrotic Amount: [1:Large (67-100%)] [N/A:N/A] Exposed Structures: [1:Fat Layer (Subcutaneous Tissue) Exposed: Yes] [N/A:N/A] Fascia: No Tendon: No Muscle: No Joint: No Bone: No Epithelialization: Small (1-33%) N/A N/A Debridement: Debridement (82505- N/A N/A 11047) Pre-procedure 09:19 N/A N/A Verification/Time Out Taken: Pain Control: Lidocaine 4% Topical N/A N/A Solution Tissue Debrided: Fibrin/Slough, Exudates, N/A N/A Subcutaneous Level: Skin/Subcutaneous N/A N/A Tissue Debridement Area (sq 7.5 N/A N/A cm): Instrument: Curette N/A N/A Bleeding: Minimum N/A N/A Hemostasis Achieved: Pressure N/A N/A Procedural Pain: 0 N/A N/A Post Procedural Pain: 0 N/A N/A Debridement Treatment Procedure was tolerated N/A N/A Response: well Post Debridement 5x1.5x0.1 N/A N/A Measurements L x W x D (cm) Post  Debridement 0.589 N/A N/A Volume: (cm) Periwound Skin Texture: Excoriation: Yes N/A N/A Scarring: Yes Induration: No Callus: No Crepitus: No Rash: No Periwound Skin Maceration: Yes N/A N/A Moisture: Dry/Scaly: No Periwound Skin Color: Hemosiderin Staining: Yes N/A N/A Mottled: Yes Atrophie Blanche: No Cyanosis: No Ecchymosis: No Erythema: No Pallor: No Rubor: No Temperature: No Abnormality N/A N/A Tenderness on Yes N/A N/A Palpation: Brad Taylor, Brad Taylor ([id number]) Wound Preparation: Ulcer Cleansing: N/A N/A Rinsed/Irrigated with Saline Topical Anesthetic Applied: Other: lidocaine 4% Procedures Performed: Debridement N/A N/A Treatment Notes Wound #1 (Right, Lateral Lower Leg) 1. Cleansed with: Clean wound with Normal Saline 2. Anesthetic Topical Lidocaine 4% cream to wound bed prior to debridement 3. Peri-wound Care: Skin Prep 4. Dressing Applied: Other dressing (specify in notes) 5. Secondary Dressing Applied Bordered Foam Dressing Dry Gauze Notes cutimed sorbact and hydrogel Electronic Signature(s) Signed: 11/14/2016 9:33:26 AM By: Christin Fudge MD, FACS Entered By: Christin Fudge on 11/14/2016 09:33:26 Brad Taylor ([id number]) -------------------------------------------------------------------------------- [place] Details Patient Name: Brad Taylor, Brad Taylor. Date of Service: 11/14/2016 8:45 AM Medical Record Number: [id number] Patient Account Number: [id number] Date of Birth/Sex: Dec 15, 1950 (66 y.o. Male) Treating RN: Ahmed Prima Primary Care Sylas Twombly: Jenna Luo Other Clinician: Referring Madalina Rosman: Jenna Luo Treating Arisbeth Purrington/Extender: Frann Rider in Treatment: 13 Active Inactive ` Orientation to the Wound Care Program Nursing Diagnoses: Knowledge deficit related to the wound healing center program Goals: Patient/caregiver will verbalize understanding of the [facility] Program Date Initiated:  08/15/2016 Target Resolution Date: 09/05/2016 Goal Status: Active Interventions: Provide education on orientation to the wound center Notes: ` Venous Leg Ulcer Nursing Diagnoses: Knowledge deficit related to disease process and management Potential for venous Insuffiency (use before diagnosis confirmed) Goals: Patient will maintain optimal edema control Date Initiated: 08/15/2016 Target Resolution Date: 09/05/2016 Goal Status: Active Patient/caregiver will verbalize understanding of disease process and disease management Date Initiated: 08/15/2016 Target Resolution Date: 09/05/2016 Goal Status: Active Verify adequate tissue perfusion prior to therapeutic compression application Date Initiated: 08/15/2016 Target Resolution Date: 09/05/2016 Goal Status: Active Interventions: Assess peripheral edema status every visit. Compression as ordered JO, CERONE ([id number]) Provide education on venous insufficiency Treatment Activities: Therapeutic compression applied : 08/15/2016 Notes: ` Wound/Skin Impairment Nursing Diagnoses: Impaired tissue integrity Knowledge deficit related to ulceration/compromised skin integrity Goals: Patient/caregiver will verbalize understanding of skin care regimen Date Initiated: 08/15/2016 Target Resolution Date: 09/05/2016 Goal Status: Active Ulcer/skin breakdown will have a volume reduction of 30% by week 4 Date Initiated: 08/15/2016 Target Resolution Date: 09/05/2016 Goal Status: Active Ulcer/skin breakdown will have a volume reduction of 50% by week 8 Date Initiated: 08/15/2016 Target Resolution Date: 09/05/2016 Goal Status: Active Ulcer/skin breakdown will have a volume reduction of 80% by week 12 Date Initiated: 08/15/2016 Target  Resolution Date: 09/05/2016 Goal Status: Active Ulcer/skin breakdown will heal within 14 weeks Date Initiated: 08/15/2016 Target Resolution Date: 09/05/2016 Goal Status: Active Interventions: Assess patient/caregiver ability to  obtain necessary supplies Assess patient/caregiver ability to perform ulcer/skin care regimen upon admission and as needed Assess ulceration(s) every visit Provide education on smoking Provide education on ulcer and skin care Treatment Activities: Referred to DME Jazion Atteberry for dressing supplies : 08/15/2016 Skin care regimen initiated : 08/15/2016 Topical wound management initiated : 08/15/2016 Notes: Brad Taylor, Brad Taylor ([id number]) Electronic Signature(s) Signed: 11/14/2016 4:33:22 PM By: Alric Quan Entered By: Alric Quan on 11/14/2016 [place], [place] ([id number]) -------------------------------------------------------------------------------- Pain Assessment Details Patient Name: Brad Taylor. Date of Service: 11/14/2016 8:45 AM Medical Record Number: [id number] Patient Account Number: [id number] Date of Birth/Sex: Dec 18, 1950 (66 y.o. Male) Treating RN: Ahmed Prima Primary Care Kathan Kirker: Jenna Luo Other Clinician: Referring Dawanna Grauberger: Jenna Luo Treating Jeanie Mccard/Extender: Frann Rider in Treatment: 13 Active Problems Location of Pain Severity and Description of Pain Patient Has Paino No Site Locations With Dressing Change: No Pain Management and Medication Current Pain Management: Electronic Signature(s) Signed: 11/14/2016 4:33:22 PM By: Alric Quan Entered By: Alric Quan on 11/14/2016 08:53:28 Brad Taylor ([id number]) -------------------------------------------------------------------------------- Patient/Caregiver Education Details Patient Name: Brad Taylor, Brad Taylor. Date of Service: 11/14/2016 8:45 AM Medical Record Number: [id number] Patient Account Number: [id number] Date of Birth/Gender: [birth year] (66 y.o. Male) Treating RN: Ahmed Prima Primary Care Physician: Jenna Luo Other Clinician: Referring Physician: Jenna Luo Treating Physician/Extender: Frann Rider in Treatment: 13 Education  Assessment Education Provided To: Patient Education Topics Provided Wound/Skin Impairment: Handouts: Other: change dressing as ordered Methods: Demonstration, Explain/Verbal Responses: State content correctly Electronic Signature(s) Signed: 11/14/2016 4:33:22 PM By: Alric Quan Entered By: Alric Quan on 11/14/2016 09:10:02 Brad Taylor ([id number]) -------------------------------------------------------------------------------- Wound Assessment Details Patient Name: Brad Taylor. Date of Service: 11/14/2016 8:45 AM Medical Record Number: [id number] Patient Account Number: [id number] Date of Birth/Sex: 08/06/1950 (66 y.o. Male) Treating RN: Ahmed Prima Primary Care Areyana Leoni: Jenna Luo Other Clinician: Referring Leilene Diprima: Jenna Luo Treating Umaima Scholten/Extender: Frann Rider in Treatment: 13 Wound Status Wound Number: 1 Primary Diabetic Wound/Ulcer of the Lower Etiology: Extremity Wound Location: Right Lower Leg - Lateral Wound Open Wounding Event: Trauma Status: Date Acquired: 05/14/2016 Comorbid Chronic Obstructive Pulmonary Disease Weeks Of Treatment: 13 History: (COPD), Arrhythmia, Hypertension, Clustered Wound: No Myocardial Infarction, Type II Diabetes Photos Photo Uploaded By: Alric Quan on 11/14/2016 16:07:25 Wound Measurements Length: (cm) 5 Width: (cm) 1.5 Depth: (cm) 0.1 Area: (cm) 5.89 Volume: (cm) 0.589 % Reduction in Area: 59.7% % Reduction in Volume: 59.7% Epithelialization: Small (1-33%) Tunneling: No Undermining: No Wound Description Classification: Grade 1 Wound Margin: Flat and Intact Exudate Amount: Large Exudate Type: Serosanguineous Exudate Color: red, brown Foul Odor After Cleansing: No Slough/Fibrino Yes Wound Bed Granulation Amount: Small (1-33%) Exposed Structure Granulation Quality: Pink, Pale Fascia Exposed: No Necrotic Amount: Large (67-100%) Fat Layer (Subcutaneous Tissue) Exposed:  Yes Necrotic Quality: Adherent Slough Tendon Exposed: No Brad Taylor, Brad Taylor. ([id number]) Muscle Exposed: No Joint Exposed: No Bone Exposed: No Periwound Skin Texture Texture Color No Abnormalities Noted: No No Abnormalities Noted: No Callus: No Atrophie Blanche: No Crepitus: No Cyanosis: No Excoriation: Yes Ecchymosis: No Induration: No Erythema: No Rash: No Hemosiderin Staining: Yes Scarring: Yes Mottled: Yes Pallor: No Moisture Rubor: No No Abnormalities Noted: No Dry / Scaly: No Temperature / Pain Maceration: Yes Temperature: No Abnormality Tenderness on Palpation: Yes Wound Preparation Ulcer Cleansing: Rinsed/Irrigated with Saline Topical Anesthetic Applied: Other:  lidocaine 4%, Treatment Notes Wound #1 (Right, Lateral Lower Leg) 1. Cleansed with: Clean wound with Normal Saline 2. Anesthetic Topical Lidocaine 4% cream to wound bed prior to debridement 3. Peri-wound Care: Skin Prep 4. Dressing Applied: Other dressing (specify in notes) 5. Secondary Dressing Applied Bordered Foam Dressing Dry Gauze Notes cutimed sorbact and hydrogel Electronic Signature(s) Signed: 11/14/2016 4:33:22 PM By: Alric Quan Entered By: Alric Quan on 11/14/2016 08:59:19 Brad Taylor ([id number]) -------------------------------------------------------------------------------- [place] Details Patient Name: Brad Taylor. Date of Service: 11/14/2016 8:45 AM Medical Record Number: [id number] Patient Account Number: [id number] Date of Birth/Sex: [birth year] (66 y.o. Male) Treating RN: Ahmed Prima Primary Care Turquoise Esch: Jenna Luo Other Clinician: Referring Tracie Lindbloom: Jenna Luo Treating Tramya Schoenfelder/Extender: Frann Rider in Treatment: 13 Vital Signs Time Taken: 08:53 Temperature (F): 97.8 Height (in): 72 Pulse (bpm): 50 Weight (lbs): 250 Respiratory Rate (breaths/min): 18 Body Mass Index (BMI): 33.9 Blood Pressure (mmHg): 142/73 Reference  Range: 80 - 120 mg / dl Electronic Signature(s) Signed: 11/14/2016 4:33:22 PM By: Alric Quan Entered By: Alric Quan on 11/14/2016 08:53:59

## 2016-11-22 DIAGNOSIS — L97919 Non-pressure chronic ulcer of unspecified part of right lower leg with unspecified severity: Secondary | ICD-10-CM | POA: Diagnosis not present

## 2016-11-28 ENCOUNTER — Ambulatory Visit: Payer: Medicare HMO | Admitting: Surgery

## 2016-12-20 DIAGNOSIS — L97919 Non-pressure chronic ulcer of unspecified part of right lower leg with unspecified severity: Secondary | ICD-10-CM | POA: Diagnosis not present

## 2017-02-26 DIAGNOSIS — J449 Chronic obstructive pulmonary disease, unspecified: Secondary | ICD-10-CM | POA: Diagnosis not present

## 2017-02-26 DIAGNOSIS — M549 Dorsalgia, unspecified: Secondary | ICD-10-CM | POA: Diagnosis not present

## 2017-02-26 DIAGNOSIS — I872 Venous insufficiency (chronic) (peripheral): Secondary | ICD-10-CM | POA: Diagnosis not present

## 2017-02-26 DIAGNOSIS — F4310 Post-traumatic stress disorder, unspecified: Secondary | ICD-10-CM | POA: Diagnosis not present

## 2017-02-26 DIAGNOSIS — I10 Essential (primary) hypertension: Secondary | ICD-10-CM | POA: Diagnosis not present

## 2017-02-26 DIAGNOSIS — G8929 Other chronic pain: Secondary | ICD-10-CM | POA: Diagnosis not present

## 2017-02-26 DIAGNOSIS — L97211 Non-pressure chronic ulcer of right calf limited to breakdown of skin: Secondary | ICD-10-CM | POA: Diagnosis not present

## 2017-02-26 DIAGNOSIS — S81801D Unspecified open wound, right lower leg, subsequent encounter: Secondary | ICD-10-CM | POA: Diagnosis not present

## 2017-02-26 DIAGNOSIS — E119 Type 2 diabetes mellitus without complications: Secondary | ICD-10-CM | POA: Diagnosis not present

## 2017-03-07 DIAGNOSIS — L97211 Non-pressure chronic ulcer of right calf limited to breakdown of skin: Secondary | ICD-10-CM | POA: Diagnosis not present

## 2017-03-07 DIAGNOSIS — M549 Dorsalgia, unspecified: Secondary | ICD-10-CM | POA: Diagnosis not present

## 2017-03-07 DIAGNOSIS — G8929 Other chronic pain: Secondary | ICD-10-CM | POA: Diagnosis not present

## 2017-03-07 DIAGNOSIS — F4310 Post-traumatic stress disorder, unspecified: Secondary | ICD-10-CM | POA: Diagnosis not present

## 2017-03-07 DIAGNOSIS — I10 Essential (primary) hypertension: Secondary | ICD-10-CM | POA: Diagnosis not present

## 2017-03-07 DIAGNOSIS — E119 Type 2 diabetes mellitus without complications: Secondary | ICD-10-CM | POA: Diagnosis not present

## 2017-03-07 DIAGNOSIS — J449 Chronic obstructive pulmonary disease, unspecified: Secondary | ICD-10-CM | POA: Diagnosis not present

## 2017-03-07 DIAGNOSIS — I872 Venous insufficiency (chronic) (peripheral): Secondary | ICD-10-CM | POA: Diagnosis not present

## 2017-03-07 DIAGNOSIS — S81801D Unspecified open wound, right lower leg, subsequent encounter: Secondary | ICD-10-CM | POA: Diagnosis not present

## 2017-03-11 DIAGNOSIS — J449 Chronic obstructive pulmonary disease, unspecified: Secondary | ICD-10-CM | POA: Diagnosis not present

## 2017-03-11 DIAGNOSIS — E119 Type 2 diabetes mellitus without complications: Secondary | ICD-10-CM | POA: Diagnosis not present

## 2017-03-11 DIAGNOSIS — L97211 Non-pressure chronic ulcer of right calf limited to breakdown of skin: Secondary | ICD-10-CM | POA: Diagnosis not present

## 2017-03-11 DIAGNOSIS — G8929 Other chronic pain: Secondary | ICD-10-CM | POA: Diagnosis not present

## 2017-03-11 DIAGNOSIS — I10 Essential (primary) hypertension: Secondary | ICD-10-CM | POA: Diagnosis not present

## 2017-03-11 DIAGNOSIS — S81801D Unspecified open wound, right lower leg, subsequent encounter: Secondary | ICD-10-CM | POA: Diagnosis not present

## 2017-03-11 DIAGNOSIS — F4310 Post-traumatic stress disorder, unspecified: Secondary | ICD-10-CM | POA: Diagnosis not present

## 2017-03-11 DIAGNOSIS — I872 Venous insufficiency (chronic) (peripheral): Secondary | ICD-10-CM | POA: Diagnosis not present

## 2017-03-11 DIAGNOSIS — M549 Dorsalgia, unspecified: Secondary | ICD-10-CM | POA: Diagnosis not present

## 2017-03-14 DIAGNOSIS — F4310 Post-traumatic stress disorder, unspecified: Secondary | ICD-10-CM | POA: Diagnosis not present

## 2017-03-14 DIAGNOSIS — M549 Dorsalgia, unspecified: Secondary | ICD-10-CM | POA: Diagnosis not present

## 2017-03-14 DIAGNOSIS — J449 Chronic obstructive pulmonary disease, unspecified: Secondary | ICD-10-CM | POA: Diagnosis not present

## 2017-03-14 DIAGNOSIS — S81801D Unspecified open wound, right lower leg, subsequent encounter: Secondary | ICD-10-CM | POA: Diagnosis not present

## 2017-03-14 DIAGNOSIS — E119 Type 2 diabetes mellitus without complications: Secondary | ICD-10-CM | POA: Diagnosis not present

## 2017-03-14 DIAGNOSIS — I10 Essential (primary) hypertension: Secondary | ICD-10-CM | POA: Diagnosis not present

## 2017-03-14 DIAGNOSIS — L97211 Non-pressure chronic ulcer of right calf limited to breakdown of skin: Secondary | ICD-10-CM | POA: Diagnosis not present

## 2017-03-14 DIAGNOSIS — G8929 Other chronic pain: Secondary | ICD-10-CM | POA: Diagnosis not present

## 2017-03-14 DIAGNOSIS — I872 Venous insufficiency (chronic) (peripheral): Secondary | ICD-10-CM | POA: Diagnosis not present

## 2017-03-18 DIAGNOSIS — S81801D Unspecified open wound, right lower leg, subsequent encounter: Secondary | ICD-10-CM | POA: Diagnosis not present

## 2017-03-18 DIAGNOSIS — L97211 Non-pressure chronic ulcer of right calf limited to breakdown of skin: Secondary | ICD-10-CM | POA: Diagnosis not present

## 2017-03-18 DIAGNOSIS — E119 Type 2 diabetes mellitus without complications: Secondary | ICD-10-CM | POA: Diagnosis not present

## 2017-03-18 DIAGNOSIS — I10 Essential (primary) hypertension: Secondary | ICD-10-CM | POA: Diagnosis not present

## 2017-03-18 DIAGNOSIS — G8929 Other chronic pain: Secondary | ICD-10-CM | POA: Diagnosis not present

## 2017-03-18 DIAGNOSIS — F4310 Post-traumatic stress disorder, unspecified: Secondary | ICD-10-CM | POA: Diagnosis not present

## 2017-03-18 DIAGNOSIS — I872 Venous insufficiency (chronic) (peripheral): Secondary | ICD-10-CM | POA: Diagnosis not present

## 2017-03-18 DIAGNOSIS — J449 Chronic obstructive pulmonary disease, unspecified: Secondary | ICD-10-CM | POA: Diagnosis not present

## 2017-03-18 DIAGNOSIS — M549 Dorsalgia, unspecified: Secondary | ICD-10-CM | POA: Diagnosis not present

## 2017-03-25 DIAGNOSIS — F4310 Post-traumatic stress disorder, unspecified: Secondary | ICD-10-CM | POA: Diagnosis not present

## 2017-03-25 DIAGNOSIS — L97211 Non-pressure chronic ulcer of right calf limited to breakdown of skin: Secondary | ICD-10-CM | POA: Diagnosis not present

## 2017-03-25 DIAGNOSIS — M549 Dorsalgia, unspecified: Secondary | ICD-10-CM | POA: Diagnosis not present

## 2017-03-25 DIAGNOSIS — G8929 Other chronic pain: Secondary | ICD-10-CM | POA: Diagnosis not present

## 2017-03-25 DIAGNOSIS — I10 Essential (primary) hypertension: Secondary | ICD-10-CM | POA: Diagnosis not present

## 2017-03-25 DIAGNOSIS — E119 Type 2 diabetes mellitus without complications: Secondary | ICD-10-CM | POA: Diagnosis not present

## 2017-03-25 DIAGNOSIS — S81801D Unspecified open wound, right lower leg, subsequent encounter: Secondary | ICD-10-CM | POA: Diagnosis not present

## 2017-03-25 DIAGNOSIS — I872 Venous insufficiency (chronic) (peripheral): Secondary | ICD-10-CM | POA: Diagnosis not present

## 2017-03-25 DIAGNOSIS — J449 Chronic obstructive pulmonary disease, unspecified: Secondary | ICD-10-CM | POA: Diagnosis not present

## 2017-03-28 DIAGNOSIS — S81801D Unspecified open wound, right lower leg, subsequent encounter: Secondary | ICD-10-CM | POA: Diagnosis not present

## 2017-03-28 DIAGNOSIS — I10 Essential (primary) hypertension: Secondary | ICD-10-CM | POA: Diagnosis not present

## 2017-03-28 DIAGNOSIS — I872 Venous insufficiency (chronic) (peripheral): Secondary | ICD-10-CM | POA: Diagnosis not present

## 2017-03-28 DIAGNOSIS — L97211 Non-pressure chronic ulcer of right calf limited to breakdown of skin: Secondary | ICD-10-CM | POA: Diagnosis not present

## 2017-03-28 DIAGNOSIS — F4310 Post-traumatic stress disorder, unspecified: Secondary | ICD-10-CM | POA: Diagnosis not present

## 2017-03-28 DIAGNOSIS — J449 Chronic obstructive pulmonary disease, unspecified: Secondary | ICD-10-CM | POA: Diagnosis not present

## 2017-03-28 DIAGNOSIS — G8929 Other chronic pain: Secondary | ICD-10-CM | POA: Diagnosis not present

## 2017-03-28 DIAGNOSIS — M549 Dorsalgia, unspecified: Secondary | ICD-10-CM | POA: Diagnosis not present

## 2017-03-28 DIAGNOSIS — E119 Type 2 diabetes mellitus without complications: Secondary | ICD-10-CM | POA: Diagnosis not present

## 2017-04-01 DIAGNOSIS — G8929 Other chronic pain: Secondary | ICD-10-CM | POA: Diagnosis not present

## 2017-04-01 DIAGNOSIS — L97211 Non-pressure chronic ulcer of right calf limited to breakdown of skin: Secondary | ICD-10-CM | POA: Diagnosis not present

## 2017-04-01 DIAGNOSIS — M549 Dorsalgia, unspecified: Secondary | ICD-10-CM | POA: Diagnosis not present

## 2017-04-01 DIAGNOSIS — J449 Chronic obstructive pulmonary disease, unspecified: Secondary | ICD-10-CM | POA: Diagnosis not present

## 2017-04-01 DIAGNOSIS — E119 Type 2 diabetes mellitus without complications: Secondary | ICD-10-CM | POA: Diagnosis not present

## 2017-04-01 DIAGNOSIS — S81801D Unspecified open wound, right lower leg, subsequent encounter: Secondary | ICD-10-CM | POA: Diagnosis not present

## 2017-04-01 DIAGNOSIS — F4310 Post-traumatic stress disorder, unspecified: Secondary | ICD-10-CM | POA: Diagnosis not present

## 2017-04-01 DIAGNOSIS — I10 Essential (primary) hypertension: Secondary | ICD-10-CM | POA: Diagnosis not present

## 2017-04-01 DIAGNOSIS — I872 Venous insufficiency (chronic) (peripheral): Secondary | ICD-10-CM | POA: Diagnosis not present

## 2017-04-04 DIAGNOSIS — E119 Type 2 diabetes mellitus without complications: Secondary | ICD-10-CM | POA: Diagnosis not present

## 2017-04-04 DIAGNOSIS — I872 Venous insufficiency (chronic) (peripheral): Secondary | ICD-10-CM | POA: Diagnosis not present

## 2017-04-04 DIAGNOSIS — I10 Essential (primary) hypertension: Secondary | ICD-10-CM | POA: Diagnosis not present

## 2017-04-04 DIAGNOSIS — L97211 Non-pressure chronic ulcer of right calf limited to breakdown of skin: Secondary | ICD-10-CM | POA: Diagnosis not present

## 2017-04-04 DIAGNOSIS — M549 Dorsalgia, unspecified: Secondary | ICD-10-CM | POA: Diagnosis not present

## 2017-04-04 DIAGNOSIS — G8929 Other chronic pain: Secondary | ICD-10-CM | POA: Diagnosis not present

## 2017-04-04 DIAGNOSIS — J449 Chronic obstructive pulmonary disease, unspecified: Secondary | ICD-10-CM | POA: Diagnosis not present

## 2017-04-04 DIAGNOSIS — F4310 Post-traumatic stress disorder, unspecified: Secondary | ICD-10-CM | POA: Diagnosis not present

## 2017-04-04 DIAGNOSIS — S81801D Unspecified open wound, right lower leg, subsequent encounter: Secondary | ICD-10-CM | POA: Diagnosis not present

## 2017-04-11 DIAGNOSIS — G8929 Other chronic pain: Secondary | ICD-10-CM | POA: Diagnosis not present

## 2017-04-11 DIAGNOSIS — L97211 Non-pressure chronic ulcer of right calf limited to breakdown of skin: Secondary | ICD-10-CM | POA: Diagnosis not present

## 2017-04-11 DIAGNOSIS — S81801D Unspecified open wound, right lower leg, subsequent encounter: Secondary | ICD-10-CM | POA: Diagnosis not present

## 2017-04-11 DIAGNOSIS — J449 Chronic obstructive pulmonary disease, unspecified: Secondary | ICD-10-CM | POA: Diagnosis not present

## 2017-04-11 DIAGNOSIS — I10 Essential (primary) hypertension: Secondary | ICD-10-CM | POA: Diagnosis not present

## 2017-04-11 DIAGNOSIS — E119 Type 2 diabetes mellitus without complications: Secondary | ICD-10-CM | POA: Diagnosis not present

## 2017-04-11 DIAGNOSIS — M549 Dorsalgia, unspecified: Secondary | ICD-10-CM | POA: Diagnosis not present

## 2017-04-11 DIAGNOSIS — F4310 Post-traumatic stress disorder, unspecified: Secondary | ICD-10-CM | POA: Diagnosis not present

## 2017-04-11 DIAGNOSIS — I872 Venous insufficiency (chronic) (peripheral): Secondary | ICD-10-CM | POA: Diagnosis not present

## 2017-04-15 DIAGNOSIS — S81801D Unspecified open wound, right lower leg, subsequent encounter: Secondary | ICD-10-CM | POA: Diagnosis not present

## 2017-04-15 DIAGNOSIS — I10 Essential (primary) hypertension: Secondary | ICD-10-CM | POA: Diagnosis not present

## 2017-04-15 DIAGNOSIS — G8929 Other chronic pain: Secondary | ICD-10-CM | POA: Diagnosis not present

## 2017-04-15 DIAGNOSIS — E119 Type 2 diabetes mellitus without complications: Secondary | ICD-10-CM | POA: Diagnosis not present

## 2017-04-15 DIAGNOSIS — F4310 Post-traumatic stress disorder, unspecified: Secondary | ICD-10-CM | POA: Diagnosis not present

## 2017-04-15 DIAGNOSIS — I872 Venous insufficiency (chronic) (peripheral): Secondary | ICD-10-CM | POA: Diagnosis not present

## 2017-04-15 DIAGNOSIS — M549 Dorsalgia, unspecified: Secondary | ICD-10-CM | POA: Diagnosis not present

## 2017-04-15 DIAGNOSIS — L97211 Non-pressure chronic ulcer of right calf limited to breakdown of skin: Secondary | ICD-10-CM | POA: Diagnosis not present

## 2017-04-15 DIAGNOSIS — J449 Chronic obstructive pulmonary disease, unspecified: Secondary | ICD-10-CM | POA: Diagnosis not present

## 2017-04-17 DIAGNOSIS — E119 Type 2 diabetes mellitus without complications: Secondary | ICD-10-CM | POA: Diagnosis not present

## 2017-04-17 DIAGNOSIS — G8929 Other chronic pain: Secondary | ICD-10-CM | POA: Diagnosis not present

## 2017-04-17 DIAGNOSIS — J449 Chronic obstructive pulmonary disease, unspecified: Secondary | ICD-10-CM | POA: Diagnosis not present

## 2017-04-17 DIAGNOSIS — L97211 Non-pressure chronic ulcer of right calf limited to breakdown of skin: Secondary | ICD-10-CM | POA: Diagnosis not present

## 2017-04-17 DIAGNOSIS — M549 Dorsalgia, unspecified: Secondary | ICD-10-CM | POA: Diagnosis not present

## 2017-04-17 DIAGNOSIS — I872 Venous insufficiency (chronic) (peripheral): Secondary | ICD-10-CM | POA: Diagnosis not present

## 2017-04-17 DIAGNOSIS — F4310 Post-traumatic stress disorder, unspecified: Secondary | ICD-10-CM | POA: Diagnosis not present

## 2017-04-17 DIAGNOSIS — S81801D Unspecified open wound, right lower leg, subsequent encounter: Secondary | ICD-10-CM | POA: Diagnosis not present

## 2017-04-17 DIAGNOSIS — I10 Essential (primary) hypertension: Secondary | ICD-10-CM | POA: Diagnosis not present

## 2017-04-22 DIAGNOSIS — F4310 Post-traumatic stress disorder, unspecified: Secondary | ICD-10-CM | POA: Diagnosis not present

## 2017-04-22 DIAGNOSIS — I10 Essential (primary) hypertension: Secondary | ICD-10-CM | POA: Diagnosis not present

## 2017-04-22 DIAGNOSIS — L97211 Non-pressure chronic ulcer of right calf limited to breakdown of skin: Secondary | ICD-10-CM | POA: Diagnosis not present

## 2017-04-22 DIAGNOSIS — M549 Dorsalgia, unspecified: Secondary | ICD-10-CM | POA: Diagnosis not present

## 2017-04-22 DIAGNOSIS — E119 Type 2 diabetes mellitus without complications: Secondary | ICD-10-CM | POA: Diagnosis not present

## 2017-04-22 DIAGNOSIS — I872 Venous insufficiency (chronic) (peripheral): Secondary | ICD-10-CM | POA: Diagnosis not present

## 2017-04-22 DIAGNOSIS — J449 Chronic obstructive pulmonary disease, unspecified: Secondary | ICD-10-CM | POA: Diagnosis not present

## 2017-04-22 DIAGNOSIS — G8929 Other chronic pain: Secondary | ICD-10-CM | POA: Diagnosis not present

## 2017-04-22 DIAGNOSIS — S81801D Unspecified open wound, right lower leg, subsequent encounter: Secondary | ICD-10-CM | POA: Diagnosis not present

## 2017-04-24 DIAGNOSIS — I872 Venous insufficiency (chronic) (peripheral): Secondary | ICD-10-CM | POA: Diagnosis not present

## 2017-04-24 DIAGNOSIS — M549 Dorsalgia, unspecified: Secondary | ICD-10-CM | POA: Diagnosis not present

## 2017-04-24 DIAGNOSIS — J449 Chronic obstructive pulmonary disease, unspecified: Secondary | ICD-10-CM | POA: Diagnosis not present

## 2017-04-24 DIAGNOSIS — F4310 Post-traumatic stress disorder, unspecified: Secondary | ICD-10-CM | POA: Diagnosis not present

## 2017-04-24 DIAGNOSIS — G8929 Other chronic pain: Secondary | ICD-10-CM | POA: Diagnosis not present

## 2017-04-24 DIAGNOSIS — S81801D Unspecified open wound, right lower leg, subsequent encounter: Secondary | ICD-10-CM | POA: Diagnosis not present

## 2017-04-24 DIAGNOSIS — I10 Essential (primary) hypertension: Secondary | ICD-10-CM | POA: Diagnosis not present

## 2017-04-24 DIAGNOSIS — E119 Type 2 diabetes mellitus without complications: Secondary | ICD-10-CM | POA: Diagnosis not present

## 2017-04-24 DIAGNOSIS — L97211 Non-pressure chronic ulcer of right calf limited to breakdown of skin: Secondary | ICD-10-CM | POA: Diagnosis not present

## 2017-04-29 DIAGNOSIS — F4310 Post-traumatic stress disorder, unspecified: Secondary | ICD-10-CM | POA: Diagnosis not present

## 2017-04-29 DIAGNOSIS — M549 Dorsalgia, unspecified: Secondary | ICD-10-CM | POA: Diagnosis not present

## 2017-04-29 DIAGNOSIS — J449 Chronic obstructive pulmonary disease, unspecified: Secondary | ICD-10-CM | POA: Diagnosis not present

## 2017-04-29 DIAGNOSIS — S81801D Unspecified open wound, right lower leg, subsequent encounter: Secondary | ICD-10-CM | POA: Diagnosis not present

## 2017-04-29 DIAGNOSIS — G8929 Other chronic pain: Secondary | ICD-10-CM | POA: Diagnosis not present

## 2017-04-29 DIAGNOSIS — L97211 Non-pressure chronic ulcer of right calf limited to breakdown of skin: Secondary | ICD-10-CM | POA: Diagnosis not present

## 2017-04-29 DIAGNOSIS — E119 Type 2 diabetes mellitus without complications: Secondary | ICD-10-CM | POA: Diagnosis not present

## 2017-04-29 DIAGNOSIS — I872 Venous insufficiency (chronic) (peripheral): Secondary | ICD-10-CM | POA: Diagnosis not present

## 2017-04-29 DIAGNOSIS — I10 Essential (primary) hypertension: Secondary | ICD-10-CM | POA: Diagnosis not present

## 2017-05-02 DIAGNOSIS — I10 Essential (primary) hypertension: Secondary | ICD-10-CM | POA: Diagnosis not present

## 2017-05-02 DIAGNOSIS — L97211 Non-pressure chronic ulcer of right calf limited to breakdown of skin: Secondary | ICD-10-CM | POA: Diagnosis not present

## 2017-05-02 DIAGNOSIS — I872 Venous insufficiency (chronic) (peripheral): Secondary | ICD-10-CM | POA: Diagnosis not present

## 2017-05-02 DIAGNOSIS — G8929 Other chronic pain: Secondary | ICD-10-CM | POA: Diagnosis not present

## 2017-05-02 DIAGNOSIS — M549 Dorsalgia, unspecified: Secondary | ICD-10-CM | POA: Diagnosis not present

## 2017-05-02 DIAGNOSIS — J449 Chronic obstructive pulmonary disease, unspecified: Secondary | ICD-10-CM | POA: Diagnosis not present

## 2017-05-02 DIAGNOSIS — F4310 Post-traumatic stress disorder, unspecified: Secondary | ICD-10-CM | POA: Diagnosis not present

## 2017-05-02 DIAGNOSIS — S81801D Unspecified open wound, right lower leg, subsequent encounter: Secondary | ICD-10-CM | POA: Diagnosis not present

## 2017-05-02 DIAGNOSIS — E119 Type 2 diabetes mellitus without complications: Secondary | ICD-10-CM | POA: Diagnosis not present

## 2017-05-06 DIAGNOSIS — I10 Essential (primary) hypertension: Secondary | ICD-10-CM | POA: Diagnosis not present

## 2017-05-06 DIAGNOSIS — L97211 Non-pressure chronic ulcer of right calf limited to breakdown of skin: Secondary | ICD-10-CM | POA: Diagnosis not present

## 2017-05-06 DIAGNOSIS — I872 Venous insufficiency (chronic) (peripheral): Secondary | ICD-10-CM | POA: Diagnosis not present

## 2017-05-06 DIAGNOSIS — F4310 Post-traumatic stress disorder, unspecified: Secondary | ICD-10-CM | POA: Diagnosis not present

## 2017-05-06 DIAGNOSIS — M549 Dorsalgia, unspecified: Secondary | ICD-10-CM | POA: Diagnosis not present

## 2017-05-06 DIAGNOSIS — E119 Type 2 diabetes mellitus without complications: Secondary | ICD-10-CM | POA: Diagnosis not present

## 2017-05-06 DIAGNOSIS — S81801D Unspecified open wound, right lower leg, subsequent encounter: Secondary | ICD-10-CM | POA: Diagnosis not present

## 2017-05-06 DIAGNOSIS — J449 Chronic obstructive pulmonary disease, unspecified: Secondary | ICD-10-CM | POA: Diagnosis not present

## 2017-05-06 DIAGNOSIS — G8929 Other chronic pain: Secondary | ICD-10-CM | POA: Diagnosis not present

## 2017-05-09 DIAGNOSIS — I872 Venous insufficiency (chronic) (peripheral): Secondary | ICD-10-CM | POA: Diagnosis not present

## 2017-05-09 DIAGNOSIS — G8929 Other chronic pain: Secondary | ICD-10-CM | POA: Diagnosis not present

## 2017-05-09 DIAGNOSIS — M549 Dorsalgia, unspecified: Secondary | ICD-10-CM | POA: Diagnosis not present

## 2017-05-09 DIAGNOSIS — L97211 Non-pressure chronic ulcer of right calf limited to breakdown of skin: Secondary | ICD-10-CM | POA: Diagnosis not present

## 2017-05-09 DIAGNOSIS — S81801D Unspecified open wound, right lower leg, subsequent encounter: Secondary | ICD-10-CM | POA: Diagnosis not present

## 2017-05-09 DIAGNOSIS — I10 Essential (primary) hypertension: Secondary | ICD-10-CM | POA: Diagnosis not present

## 2017-05-09 DIAGNOSIS — E119 Type 2 diabetes mellitus without complications: Secondary | ICD-10-CM | POA: Diagnosis not present

## 2017-05-09 DIAGNOSIS — F4310 Post-traumatic stress disorder, unspecified: Secondary | ICD-10-CM | POA: Diagnosis not present

## 2017-05-09 DIAGNOSIS — J449 Chronic obstructive pulmonary disease, unspecified: Secondary | ICD-10-CM | POA: Diagnosis not present

## 2017-05-13 DIAGNOSIS — L97211 Non-pressure chronic ulcer of right calf limited to breakdown of skin: Secondary | ICD-10-CM | POA: Diagnosis not present

## 2017-05-13 DIAGNOSIS — F4310 Post-traumatic stress disorder, unspecified: Secondary | ICD-10-CM | POA: Diagnosis not present

## 2017-05-13 DIAGNOSIS — E119 Type 2 diabetes mellitus without complications: Secondary | ICD-10-CM | POA: Diagnosis not present

## 2017-05-13 DIAGNOSIS — I872 Venous insufficiency (chronic) (peripheral): Secondary | ICD-10-CM | POA: Diagnosis not present

## 2017-05-13 DIAGNOSIS — G8929 Other chronic pain: Secondary | ICD-10-CM | POA: Diagnosis not present

## 2017-05-13 DIAGNOSIS — I10 Essential (primary) hypertension: Secondary | ICD-10-CM | POA: Diagnosis not present

## 2017-05-13 DIAGNOSIS — M549 Dorsalgia, unspecified: Secondary | ICD-10-CM | POA: Diagnosis not present

## 2017-05-13 DIAGNOSIS — J449 Chronic obstructive pulmonary disease, unspecified: Secondary | ICD-10-CM | POA: Diagnosis not present

## 2017-05-13 DIAGNOSIS — S81801D Unspecified open wound, right lower leg, subsequent encounter: Secondary | ICD-10-CM | POA: Diagnosis not present

## 2017-05-16 DIAGNOSIS — E119 Type 2 diabetes mellitus without complications: Secondary | ICD-10-CM | POA: Diagnosis not present

## 2017-05-16 DIAGNOSIS — F4310 Post-traumatic stress disorder, unspecified: Secondary | ICD-10-CM | POA: Diagnosis not present

## 2017-05-16 DIAGNOSIS — L97211 Non-pressure chronic ulcer of right calf limited to breakdown of skin: Secondary | ICD-10-CM | POA: Diagnosis not present

## 2017-05-16 DIAGNOSIS — G8929 Other chronic pain: Secondary | ICD-10-CM | POA: Diagnosis not present

## 2017-05-16 DIAGNOSIS — S81801D Unspecified open wound, right lower leg, subsequent encounter: Secondary | ICD-10-CM | POA: Diagnosis not present

## 2017-05-16 DIAGNOSIS — J449 Chronic obstructive pulmonary disease, unspecified: Secondary | ICD-10-CM | POA: Diagnosis not present

## 2017-05-16 DIAGNOSIS — M549 Dorsalgia, unspecified: Secondary | ICD-10-CM | POA: Diagnosis not present

## 2017-05-16 DIAGNOSIS — I10 Essential (primary) hypertension: Secondary | ICD-10-CM | POA: Diagnosis not present

## 2017-05-16 DIAGNOSIS — I872 Venous insufficiency (chronic) (peripheral): Secondary | ICD-10-CM | POA: Diagnosis not present

## 2017-05-19 DIAGNOSIS — S81801D Unspecified open wound, right lower leg, subsequent encounter: Secondary | ICD-10-CM | POA: Diagnosis not present

## 2017-05-19 DIAGNOSIS — J449 Chronic obstructive pulmonary disease, unspecified: Secondary | ICD-10-CM | POA: Diagnosis not present

## 2017-05-19 DIAGNOSIS — E119 Type 2 diabetes mellitus without complications: Secondary | ICD-10-CM | POA: Diagnosis not present

## 2017-05-19 DIAGNOSIS — G8929 Other chronic pain: Secondary | ICD-10-CM | POA: Diagnosis not present

## 2017-05-19 DIAGNOSIS — I10 Essential (primary) hypertension: Secondary | ICD-10-CM | POA: Diagnosis not present

## 2017-05-19 DIAGNOSIS — L97211 Non-pressure chronic ulcer of right calf limited to breakdown of skin: Secondary | ICD-10-CM | POA: Diagnosis not present

## 2017-05-19 DIAGNOSIS — F4310 Post-traumatic stress disorder, unspecified: Secondary | ICD-10-CM | POA: Diagnosis not present

## 2017-05-19 DIAGNOSIS — M549 Dorsalgia, unspecified: Secondary | ICD-10-CM | POA: Diagnosis not present

## 2017-05-19 DIAGNOSIS — I872 Venous insufficiency (chronic) (peripheral): Secondary | ICD-10-CM | POA: Diagnosis not present

## 2017-05-23 DIAGNOSIS — E119 Type 2 diabetes mellitus without complications: Secondary | ICD-10-CM | POA: Diagnosis not present

## 2017-05-23 DIAGNOSIS — M549 Dorsalgia, unspecified: Secondary | ICD-10-CM | POA: Diagnosis not present

## 2017-05-23 DIAGNOSIS — L97211 Non-pressure chronic ulcer of right calf limited to breakdown of skin: Secondary | ICD-10-CM | POA: Diagnosis not present

## 2017-05-23 DIAGNOSIS — G8929 Other chronic pain: Secondary | ICD-10-CM | POA: Diagnosis not present

## 2017-05-23 DIAGNOSIS — S81801D Unspecified open wound, right lower leg, subsequent encounter: Secondary | ICD-10-CM | POA: Diagnosis not present

## 2017-05-23 DIAGNOSIS — I872 Venous insufficiency (chronic) (peripheral): Secondary | ICD-10-CM | POA: Diagnosis not present

## 2017-05-23 DIAGNOSIS — I10 Essential (primary) hypertension: Secondary | ICD-10-CM | POA: Diagnosis not present

## 2017-05-23 DIAGNOSIS — J449 Chronic obstructive pulmonary disease, unspecified: Secondary | ICD-10-CM | POA: Diagnosis not present

## 2017-05-23 DIAGNOSIS — F4310 Post-traumatic stress disorder, unspecified: Secondary | ICD-10-CM | POA: Diagnosis not present

## 2017-05-31 DIAGNOSIS — G8929 Other chronic pain: Secondary | ICD-10-CM | POA: Diagnosis not present

## 2017-05-31 DIAGNOSIS — I10 Essential (primary) hypertension: Secondary | ICD-10-CM | POA: Diagnosis not present

## 2017-05-31 DIAGNOSIS — S81801D Unspecified open wound, right lower leg, subsequent encounter: Secondary | ICD-10-CM | POA: Diagnosis not present

## 2017-05-31 DIAGNOSIS — E119 Type 2 diabetes mellitus without complications: Secondary | ICD-10-CM | POA: Diagnosis not present

## 2017-05-31 DIAGNOSIS — L97211 Non-pressure chronic ulcer of right calf limited to breakdown of skin: Secondary | ICD-10-CM | POA: Diagnosis not present

## 2017-05-31 DIAGNOSIS — M549 Dorsalgia, unspecified: Secondary | ICD-10-CM | POA: Diagnosis not present

## 2017-05-31 DIAGNOSIS — I872 Venous insufficiency (chronic) (peripheral): Secondary | ICD-10-CM | POA: Diagnosis not present

## 2017-05-31 DIAGNOSIS — J449 Chronic obstructive pulmonary disease, unspecified: Secondary | ICD-10-CM | POA: Diagnosis not present

## 2017-05-31 DIAGNOSIS — F4310 Post-traumatic stress disorder, unspecified: Secondary | ICD-10-CM | POA: Diagnosis not present

## 2017-06-06 DIAGNOSIS — F4310 Post-traumatic stress disorder, unspecified: Secondary | ICD-10-CM | POA: Diagnosis not present

## 2017-06-06 DIAGNOSIS — E119 Type 2 diabetes mellitus without complications: Secondary | ICD-10-CM | POA: Diagnosis not present

## 2017-06-06 DIAGNOSIS — I872 Venous insufficiency (chronic) (peripheral): Secondary | ICD-10-CM | POA: Diagnosis not present

## 2017-06-06 DIAGNOSIS — J449 Chronic obstructive pulmonary disease, unspecified: Secondary | ICD-10-CM | POA: Diagnosis not present

## 2017-06-06 DIAGNOSIS — M549 Dorsalgia, unspecified: Secondary | ICD-10-CM | POA: Diagnosis not present

## 2017-06-06 DIAGNOSIS — G8929 Other chronic pain: Secondary | ICD-10-CM | POA: Diagnosis not present

## 2017-06-06 DIAGNOSIS — I10 Essential (primary) hypertension: Secondary | ICD-10-CM | POA: Diagnosis not present

## 2017-06-06 DIAGNOSIS — L97211 Non-pressure chronic ulcer of right calf limited to breakdown of skin: Secondary | ICD-10-CM | POA: Diagnosis not present

## 2017-06-06 DIAGNOSIS — S81801D Unspecified open wound, right lower leg, subsequent encounter: Secondary | ICD-10-CM | POA: Diagnosis not present

## 2017-06-09 DIAGNOSIS — L97211 Non-pressure chronic ulcer of right calf limited to breakdown of skin: Secondary | ICD-10-CM | POA: Diagnosis not present

## 2017-06-09 DIAGNOSIS — S81801D Unspecified open wound, right lower leg, subsequent encounter: Secondary | ICD-10-CM | POA: Diagnosis not present

## 2017-06-09 DIAGNOSIS — G8929 Other chronic pain: Secondary | ICD-10-CM | POA: Diagnosis not present

## 2017-06-09 DIAGNOSIS — J449 Chronic obstructive pulmonary disease, unspecified: Secondary | ICD-10-CM | POA: Diagnosis not present

## 2017-06-09 DIAGNOSIS — I10 Essential (primary) hypertension: Secondary | ICD-10-CM | POA: Diagnosis not present

## 2017-06-09 DIAGNOSIS — F4310 Post-traumatic stress disorder, unspecified: Secondary | ICD-10-CM | POA: Diagnosis not present

## 2017-06-09 DIAGNOSIS — I872 Venous insufficiency (chronic) (peripheral): Secondary | ICD-10-CM | POA: Diagnosis not present

## 2017-06-09 DIAGNOSIS — M549 Dorsalgia, unspecified: Secondary | ICD-10-CM | POA: Diagnosis not present

## 2017-06-09 DIAGNOSIS — E119 Type 2 diabetes mellitus without complications: Secondary | ICD-10-CM | POA: Diagnosis not present

## 2017-06-11 DIAGNOSIS — I872 Venous insufficiency (chronic) (peripheral): Secondary | ICD-10-CM | POA: Diagnosis not present

## 2017-06-11 DIAGNOSIS — L97211 Non-pressure chronic ulcer of right calf limited to breakdown of skin: Secondary | ICD-10-CM | POA: Diagnosis not present

## 2017-06-11 DIAGNOSIS — S81801D Unspecified open wound, right lower leg, subsequent encounter: Secondary | ICD-10-CM | POA: Diagnosis not present

## 2017-06-11 DIAGNOSIS — F4310 Post-traumatic stress disorder, unspecified: Secondary | ICD-10-CM | POA: Diagnosis not present

## 2017-06-11 DIAGNOSIS — E119 Type 2 diabetes mellitus without complications: Secondary | ICD-10-CM | POA: Diagnosis not present

## 2017-06-11 DIAGNOSIS — M549 Dorsalgia, unspecified: Secondary | ICD-10-CM | POA: Diagnosis not present

## 2017-06-11 DIAGNOSIS — I10 Essential (primary) hypertension: Secondary | ICD-10-CM | POA: Diagnosis not present

## 2017-06-11 DIAGNOSIS — J449 Chronic obstructive pulmonary disease, unspecified: Secondary | ICD-10-CM | POA: Diagnosis not present

## 2017-06-11 DIAGNOSIS — G8929 Other chronic pain: Secondary | ICD-10-CM | POA: Diagnosis not present

## 2017-06-13 DIAGNOSIS — M549 Dorsalgia, unspecified: Secondary | ICD-10-CM | POA: Diagnosis not present

## 2017-06-13 DIAGNOSIS — E119 Type 2 diabetes mellitus without complications: Secondary | ICD-10-CM | POA: Diagnosis not present

## 2017-06-13 DIAGNOSIS — L97211 Non-pressure chronic ulcer of right calf limited to breakdown of skin: Secondary | ICD-10-CM | POA: Diagnosis not present

## 2017-06-13 DIAGNOSIS — S81801D Unspecified open wound, right lower leg, subsequent encounter: Secondary | ICD-10-CM | POA: Diagnosis not present

## 2017-06-13 DIAGNOSIS — J449 Chronic obstructive pulmonary disease, unspecified: Secondary | ICD-10-CM | POA: Diagnosis not present

## 2017-06-13 DIAGNOSIS — I872 Venous insufficiency (chronic) (peripheral): Secondary | ICD-10-CM | POA: Diagnosis not present

## 2017-06-13 DIAGNOSIS — G8929 Other chronic pain: Secondary | ICD-10-CM | POA: Diagnosis not present

## 2017-06-13 DIAGNOSIS — I10 Essential (primary) hypertension: Secondary | ICD-10-CM | POA: Diagnosis not present

## 2017-06-13 DIAGNOSIS — F4310 Post-traumatic stress disorder, unspecified: Secondary | ICD-10-CM | POA: Diagnosis not present

## 2017-06-16 DIAGNOSIS — I872 Venous insufficiency (chronic) (peripheral): Secondary | ICD-10-CM | POA: Diagnosis not present

## 2017-06-16 DIAGNOSIS — I10 Essential (primary) hypertension: Secondary | ICD-10-CM | POA: Diagnosis not present

## 2017-06-16 DIAGNOSIS — F4310 Post-traumatic stress disorder, unspecified: Secondary | ICD-10-CM | POA: Diagnosis not present

## 2017-06-16 DIAGNOSIS — G8929 Other chronic pain: Secondary | ICD-10-CM | POA: Diagnosis not present

## 2017-06-16 DIAGNOSIS — E119 Type 2 diabetes mellitus without complications: Secondary | ICD-10-CM | POA: Diagnosis not present

## 2017-06-16 DIAGNOSIS — J449 Chronic obstructive pulmonary disease, unspecified: Secondary | ICD-10-CM | POA: Diagnosis not present

## 2017-06-16 DIAGNOSIS — M549 Dorsalgia, unspecified: Secondary | ICD-10-CM | POA: Diagnosis not present

## 2017-06-16 DIAGNOSIS — L97211 Non-pressure chronic ulcer of right calf limited to breakdown of skin: Secondary | ICD-10-CM | POA: Diagnosis not present

## 2017-06-16 DIAGNOSIS — S81801D Unspecified open wound, right lower leg, subsequent encounter: Secondary | ICD-10-CM | POA: Diagnosis not present

## 2017-06-20 DIAGNOSIS — G8929 Other chronic pain: Secondary | ICD-10-CM | POA: Diagnosis not present

## 2017-06-20 DIAGNOSIS — E119 Type 2 diabetes mellitus without complications: Secondary | ICD-10-CM | POA: Diagnosis not present

## 2017-06-20 DIAGNOSIS — L97211 Non-pressure chronic ulcer of right calf limited to breakdown of skin: Secondary | ICD-10-CM | POA: Diagnosis not present

## 2017-06-20 DIAGNOSIS — S81801D Unspecified open wound, right lower leg, subsequent encounter: Secondary | ICD-10-CM | POA: Diagnosis not present

## 2017-06-20 DIAGNOSIS — M549 Dorsalgia, unspecified: Secondary | ICD-10-CM | POA: Diagnosis not present

## 2017-06-20 DIAGNOSIS — I872 Venous insufficiency (chronic) (peripheral): Secondary | ICD-10-CM | POA: Diagnosis not present

## 2017-06-20 DIAGNOSIS — I10 Essential (primary) hypertension: Secondary | ICD-10-CM | POA: Diagnosis not present

## 2017-06-20 DIAGNOSIS — F4310 Post-traumatic stress disorder, unspecified: Secondary | ICD-10-CM | POA: Diagnosis not present

## 2017-06-20 DIAGNOSIS — J449 Chronic obstructive pulmonary disease, unspecified: Secondary | ICD-10-CM | POA: Diagnosis not present

## 2017-06-30 DIAGNOSIS — G8929 Other chronic pain: Secondary | ICD-10-CM | POA: Diagnosis not present

## 2017-06-30 DIAGNOSIS — S81801D Unspecified open wound, right lower leg, subsequent encounter: Secondary | ICD-10-CM | POA: Diagnosis not present

## 2017-06-30 DIAGNOSIS — J449 Chronic obstructive pulmonary disease, unspecified: Secondary | ICD-10-CM | POA: Diagnosis not present

## 2017-06-30 DIAGNOSIS — L97211 Non-pressure chronic ulcer of right calf limited to breakdown of skin: Secondary | ICD-10-CM | POA: Diagnosis not present

## 2017-06-30 DIAGNOSIS — M1990 Unspecified osteoarthritis, unspecified site: Secondary | ICD-10-CM | POA: Diagnosis not present

## 2017-06-30 DIAGNOSIS — I10 Essential (primary) hypertension: Secondary | ICD-10-CM | POA: Diagnosis not present

## 2017-06-30 DIAGNOSIS — I872 Venous insufficiency (chronic) (peripheral): Secondary | ICD-10-CM | POA: Diagnosis not present

## 2017-06-30 DIAGNOSIS — E119 Type 2 diabetes mellitus without complications: Secondary | ICD-10-CM | POA: Diagnosis not present

## 2017-06-30 DIAGNOSIS — F4310 Post-traumatic stress disorder, unspecified: Secondary | ICD-10-CM | POA: Diagnosis not present

## 2017-07-07 DIAGNOSIS — I10 Essential (primary) hypertension: Secondary | ICD-10-CM | POA: Diagnosis not present

## 2017-07-07 DIAGNOSIS — I872 Venous insufficiency (chronic) (peripheral): Secondary | ICD-10-CM | POA: Diagnosis not present

## 2017-07-07 DIAGNOSIS — S81801D Unspecified open wound, right lower leg, subsequent encounter: Secondary | ICD-10-CM | POA: Diagnosis not present

## 2017-07-07 DIAGNOSIS — J449 Chronic obstructive pulmonary disease, unspecified: Secondary | ICD-10-CM | POA: Diagnosis not present

## 2017-07-07 DIAGNOSIS — M1990 Unspecified osteoarthritis, unspecified site: Secondary | ICD-10-CM | POA: Diagnosis not present

## 2017-07-07 DIAGNOSIS — G8929 Other chronic pain: Secondary | ICD-10-CM | POA: Diagnosis not present

## 2017-07-07 DIAGNOSIS — F4310 Post-traumatic stress disorder, unspecified: Secondary | ICD-10-CM | POA: Diagnosis not present

## 2017-07-07 DIAGNOSIS — E119 Type 2 diabetes mellitus without complications: Secondary | ICD-10-CM | POA: Diagnosis not present

## 2017-07-07 DIAGNOSIS — L97211 Non-pressure chronic ulcer of right calf limited to breakdown of skin: Secondary | ICD-10-CM | POA: Diagnosis not present

## 2017-07-10 DIAGNOSIS — M1990 Unspecified osteoarthritis, unspecified site: Secondary | ICD-10-CM | POA: Diagnosis not present

## 2017-07-10 DIAGNOSIS — I872 Venous insufficiency (chronic) (peripheral): Secondary | ICD-10-CM | POA: Diagnosis not present

## 2017-07-10 DIAGNOSIS — S81801D Unspecified open wound, right lower leg, subsequent encounter: Secondary | ICD-10-CM | POA: Diagnosis not present

## 2017-07-10 DIAGNOSIS — I10 Essential (primary) hypertension: Secondary | ICD-10-CM | POA: Diagnosis not present

## 2017-07-10 DIAGNOSIS — J449 Chronic obstructive pulmonary disease, unspecified: Secondary | ICD-10-CM | POA: Diagnosis not present

## 2017-07-10 DIAGNOSIS — E119 Type 2 diabetes mellitus without complications: Secondary | ICD-10-CM | POA: Diagnosis not present

## 2017-07-10 DIAGNOSIS — G8929 Other chronic pain: Secondary | ICD-10-CM | POA: Diagnosis not present

## 2017-07-10 DIAGNOSIS — L97211 Non-pressure chronic ulcer of right calf limited to breakdown of skin: Secondary | ICD-10-CM | POA: Diagnosis not present

## 2017-07-10 DIAGNOSIS — F4310 Post-traumatic stress disorder, unspecified: Secondary | ICD-10-CM | POA: Diagnosis not present

## 2017-07-22 ENCOUNTER — Encounter: Payer: Self-pay | Admitting: Family Medicine

## 2017-07-22 ENCOUNTER — Ambulatory Visit: Payer: Commercial Managed Care - HMO | Admitting: Family Medicine

## 2017-07-22 VITALS — BP 126/64 | HR 58 | Temp 98.1°F | Resp 20 | Ht 70.75 in | Wt 256.0 lb

## 2017-07-22 DIAGNOSIS — J441 Chronic obstructive pulmonary disease with (acute) exacerbation: Secondary | ICD-10-CM | POA: Diagnosis not present

## 2017-07-22 MED ORDER — PREDNISONE 20 MG PO TABS: ORAL_TABLET | ORAL | 0 refills | Status: DC

## 2017-07-22 MED ORDER — LEVOFLOXACIN 500 MG PO TABS: 500.0000 mg | ORAL_TABLET | Freq: Every day | ORAL | 0 refills | Status: DC

## 2017-07-22 MED ORDER — ALBUTEROL SULFATE HFA 108 (90 BASE) MCG/ACT IN AERS: 2.0000 | INHALATION_SPRAY | Freq: Four times a day (QID) | RESPIRATORY_TRACT | 0 refills | Status: DC | PRN

## 2017-07-22 NOTE — Progress Notes (Signed)
Subjective:    Patient ID: Brad Taylor, male    DOB: September 30, 1950, 66 y.o.   MRN: [id number]  HPI Patient presents with 1 week of worsening chest congestion, audible wheezing, increasing shortness of breath, and head congestion.  On examination today, the patient has markedly diminished breath sounds left greater than right.  He has diffuse rhonchorous breath sounds but he has right basilar rales heard posteriorly.  He has diffuse expiratory wheezing.  He has diminished breath sounds bilaterally.  He has not been using albuterol.  He denies any hemoptysis.  He denies any chest pain.  He denies any fevers or chills or pleurisy Past Medical History:  Diagnosis Date  . AAA (abdominal aortic aneurysm) ([place]) 5/15   3.5x3.5 cm  . COPD (chronic obstructive pulmonary disease) ([place])   . Depression   . Diabetes mellitus without complication ([place])   . GERD (gastroesophageal reflux disease)   . Hyperlipidemia   . Hypothyroidism   . Post traumatic stress disorder   . Stroke ([place])   . Thyroid disease   . Ulcer    Past Surgical History:  Procedure Laterality Date  . CATARACT EXTRACTION    . CHOLECYSTECTOMY    . KNEE SURGERY     Current Outpatient Medications on File Prior to Visit  Medication Sig Dispense Refill  . atorvastatin (LIPITOR) 10 MG tablet Take 10 mg by mouth daily. Takes 5 mg    . buPROPion (WELLBUTRIN SR) 150 MG 12 hr tablet Take 1 tablet (150 mg total) by mouth daily. 30 tablet 1  . busPIRone (BUSPAR) 10 MG tablet Take 0.5 tablets (5 mg total) by mouth 2 (two) times daily as needed (for anxiety). 60 tablet 0  . clopidogrel (PLAVIX) 75 MG tablet Take 1 tablet (75 mg total) by mouth daily with breakfast. 90 tablet 3  . insulin glargine (LANTUS) 100 UNIT/ML injection 50 units every a.m. & 40 units qhs    . Insulin Pen Needle (PEN NEEDLES 5/16") 30G X 8 MM MISC Use with insulin pen daily DX:E11.9 100 each 5  . levothyroxine (SYNTHROID, LEVOTHROID) 125 MCG tablet Take 1 tablet (125  mcg total) by mouth daily before breakfast. 30 tablet 1  . RABEprazole (ACIPHEX) 20 MG tablet Take 2 tablets (40 mg total) by mouth daily before breakfast. 30 tablet 1  . silver sulfADIAZINE (SILVADENE) 1 % cream Apply 1 application topically daily. 50 g 1  . traZODone (DESYREL) 100 MG tablet Take 3 tablets (300 mg total) by mouth at bedtime. 30 tablet 0  . venlafaxine XR (EFFEXOR-XR) 150 MG 24 hr capsule Take 1 capsule (150 mg total) by mouth daily with breakfast. 30 capsule 1   No current facility-administered medications on file prior to visit.    No Known Allergies Social History   Socioeconomic History  . Marital status: Married    Spouse name: Diane  . Number of children: 1  . Years of education: HS  . Highest education level: Not on file  Social Needs  . Financial resource strain: Not on file  . Food insecurity - worry: Not on file  . Food insecurity - inability: Not on file  . Transportation needs - medical: Not on file  . Transportation needs - non-medical: Not on file  Occupational History  . Occupation: Retired  Tobacco Use  . Smoking status: Current Every Day Smoker    Packs/day: 1.00    Years: 30.00    Pack years: 30.00    Types: Cigarettes  .  Smokeless tobacco: Former Systems developer    Quit date: 05/04/2013  Substance and Sexual Activity  . Alcohol use: No    Comment: Former heavy drinker, been sober for 10 years  . Drug use: No  . Sexual activity: Not on file  Other Topics Concern  . Not on file  Social History Narrative   Patient lives at home with spouse.   Caffeine Use: 3-4 cups daily   Retired Dealer.      Review of Systems  All other systems reviewed and are negative.      Objective:   Physical Exam  Constitutional: He appears well-developed and well-nourished. No distress.  HENT:  Right Ear: External ear normal.  Left Ear: External ear normal.  Nose: Nose normal.  Mouth/Throat: Oropharynx is clear and moist. No oropharyngeal exudate.  Eyes:  Conjunctivae are normal.  Neck: Neck supple.  Cardiovascular: Normal rate, regular rhythm and normal heart sounds.  No murmur heard. Pulmonary/Chest: No accessory muscle usage. No respiratory distress. He has decreased breath sounds in the right upper field, the right lower field, the left upper field and the left lower field. He has wheezes in the right upper field, the right lower field, the left upper field and the left lower field. He has rhonchi. He has rales in the right lower field.  Skin: He is not diaphoretic.  Vitals reviewed.         Assessment & Plan:  COPD exacerbation ([place]) - Plan: levofloxacin (LEVAQUIN) 500 MG tablet, predniSONE (DELTASONE) 20 MG tablet, albuterol (PROVENTIL HFA;VENTOLIN HFA) 108 (90 Base) MCG/ACT inhaler  Patient appears to have a COPD exacerbation.  Begin prednisone taper pack for the bronchospasms appreciated on exam in addition to albuterol 2 puffs every 6 hours as needed.  Add Levaquin 500 mg p.o. daily for 7 days particularly given the Rales appreciated in the right lower lobe.  Recheck next week or sooner if worse

## 2017-08-01 DIAGNOSIS — I10 Essential (primary) hypertension: Secondary | ICD-10-CM | POA: Diagnosis not present

## 2017-08-01 DIAGNOSIS — M1990 Unspecified osteoarthritis, unspecified site: Secondary | ICD-10-CM | POA: Diagnosis not present

## 2017-08-01 DIAGNOSIS — J449 Chronic obstructive pulmonary disease, unspecified: Secondary | ICD-10-CM | POA: Diagnosis not present

## 2017-08-01 DIAGNOSIS — I872 Venous insufficiency (chronic) (peripheral): Secondary | ICD-10-CM | POA: Diagnosis not present

## 2017-08-01 DIAGNOSIS — F4310 Post-traumatic stress disorder, unspecified: Secondary | ICD-10-CM | POA: Diagnosis not present

## 2017-08-01 DIAGNOSIS — E119 Type 2 diabetes mellitus without complications: Secondary | ICD-10-CM | POA: Diagnosis not present

## 2017-08-01 DIAGNOSIS — S81801D Unspecified open wound, right lower leg, subsequent encounter: Secondary | ICD-10-CM | POA: Diagnosis not present

## 2017-08-01 DIAGNOSIS — G8929 Other chronic pain: Secondary | ICD-10-CM | POA: Diagnosis not present

## 2017-08-01 DIAGNOSIS — L97211 Non-pressure chronic ulcer of right calf limited to breakdown of skin: Secondary | ICD-10-CM | POA: Diagnosis not present

## 2017-08-08 ENCOUNTER — Encounter: Payer: Self-pay | Admitting: *Deleted

## 2017-08-08 ENCOUNTER — Ambulatory Visit (INDEPENDENT_AMBULATORY_CARE_PROVIDER_SITE_OTHER): Payer: Medicare HMO | Admitting: Vascular Surgery

## 2017-08-08 ENCOUNTER — Encounter: Payer: Self-pay | Admitting: Vascular Surgery

## 2017-08-08 ENCOUNTER — Other Ambulatory Visit: Payer: Self-pay | Admitting: *Deleted

## 2017-08-08 VITALS — BP 139/83 | HR 79 | Temp 98.1°F | Resp 20 | Ht 70.75 in | Wt 263.7 lb

## 2017-08-08 DIAGNOSIS — I739 Peripheral vascular disease, unspecified: Secondary | ICD-10-CM

## 2017-08-08 DIAGNOSIS — I872 Venous insufficiency (chronic) (peripheral): Secondary | ICD-10-CM

## 2017-08-08 NOTE — Progress Notes (Signed)
Patient ID: Brad Taylor, male   DOB: Jun 17, 1951, 67 y.o.   MRN: [id number]  Reason for Consult: Follow-up (to discuss surgery)   Referred by [name], MD  Subjective:     HPI:  Brad Taylor is a 67 y.o. male follows up 9 months after I last saw him and planned him for possible angiogram of the right lower extremity.  He has ulceration of the right leg which is failed to heal but that is likely multifactorial both venous and arterial in nature.  He is a chronic long-term smoker and continues to do so.  He does not take blood thinners but takes Plavix and statin drug.  He is ambulatory has swelling of both legs.  He gets his wound care at the [facility] in [place].  He also has a known abdominal aortic aneurysm also followed by the [facility].  Leg wound has failed to progress although not infected at this time.  He is here to discuss surgery today.  Past Medical History:  Diagnosis Date  . AAA (abdominal aortic aneurysm) ([place]) 5/15   3.5x3.5 cm  . COPD (chronic obstructive pulmonary disease) ([place])   . Depression   . Diabetes mellitus without complication ([place])   . GERD (gastroesophageal reflux disease)   . Hyperlipidemia   . Hypothyroidism   . Post traumatic stress disorder   . Stroke ([place])   . Thyroid disease   . Ulcer    Family History  Problem Relation Age of Onset  . Emphysema Father   . Thyroid disease Sister   . Stroke Maternal Grandmother   . Thyroid disease Sister   . Thyroid disease Sister    Past Surgical History:  Procedure Laterality Date  . CATARACT EXTRACTION    . CHOLECYSTECTOMY    . KNEE SURGERY      Short Social History:  Social History   Tobacco Use  . Smoking status: Current Every Day Smoker    Packs/day: 1.00    Years: 30.00    Pack years: 30.00    Types: Cigarettes  . Smokeless tobacco: Former Systems developer    Quit date: 05/04/2013  Substance Use Topics  . Alcohol use: No    Comment: Former heavy drinker, been sober for 10 years    No Known  Allergies  Current Outpatient Medications  Medication Sig Dispense Refill  . albuterol (PROVENTIL HFA;VENTOLIN HFA) 108 (90 Base) MCG/ACT inhaler Inhale 2 puffs into the lungs every 6 (six) hours as needed for wheezing or shortness of breath. 1 Inhaler 0  . atorvastatin (LIPITOR) 10 MG tablet Take 10 mg by mouth daily. Takes 5 mg    . buPROPion (WELLBUTRIN SR) 150 MG 12 hr tablet Take 1 tablet (150 mg total) by mouth daily. 30 tablet 1  . busPIRone (BUSPAR) 10 MG tablet Take 0.5 tablets (5 mg total) by mouth 2 (two) times daily as needed (for anxiety). 60 tablet 0  . clopidogrel (PLAVIX) 75 MG tablet Take 1 tablet (75 mg total) by mouth daily with breakfast. 90 tablet 3  . doxycycline (VIBRAMYCIN) 100 MG capsule Take 100 mg by mouth 2 (two) times daily.    . insulin glargine (LANTUS) 100 UNIT/ML injection 50 units every a.m. & 40 units qhs    . Insulin Pen Needle (PEN NEEDLES 5/16") 30G X 8 MM MISC Use with insulin pen daily DX:E11.9 100 each 5  . levofloxacin (LEVAQUIN) 500 MG tablet Take 1 tablet (500 mg total) by mouth daily. 7  tablet 0  . levothyroxine (SYNTHROID, LEVOTHROID) 125 MCG tablet Take 1 tablet (125 mcg total) by mouth daily before breakfast. 30 tablet 1  . predniSONE (DELTASONE) 20 MG tablet 3 tabs poqday 1-2, 2 tabs poqday 3-4, 1 tab poqday 5-6 12 tablet 0  . RABEprazole (ACIPHEX) 20 MG tablet Take 2 tablets (40 mg total) by mouth daily before breakfast. 30 tablet 1  . silver sulfADIAZINE (SILVADENE) 1 % cream Apply 1 application topically daily. 50 g 1  . traZODone (DESYREL) 100 MG tablet Take 3 tablets (300 mg total) by mouth at bedtime. 30 tablet 0  . venlafaxine XR (EFFEXOR-XR) 150 MG 24 hr capsule Take 1 capsule (150 mg total) by mouth daily with breakfast. 30 capsule 1   No current facility-administered medications for this visit.     Review of Systems  Constitutional:  Constitutional negative. HENT: HENT negative.  Eyes: Eyes negative.  Respiratory: Positive for  cough, shortness of breath and wheezing.  Cardiovascular: Cardiovascular negative. Positive for dyspnea with exertion.  GI: Gastrointestinal negative.  Skin: Positive for wound.  Neurological: Neurological negative. Hematologic: Hematologic/lymphatic negative.  Psychiatric: Psychiatric negative.        Objective:  Objective   Vitals:   08/08/17 1046  BP: 139/83  Pulse: 79  Resp: 20  Temp: 98.1 F (36.7 C)  TempSrc: Oral  SpO2: 90%  Weight: 263 lb 11.2 oz (119.6 kg)  Height: 5' 10.75" (1.797 m)   Body mass index is 37.04 kg/m.  Physical Exam  Constitutional: He is oriented to person, place, and time. He appears well-developed.  HENT:  Head: Normocephalic.  Eyes: Pupils are equal, round, and reactive to light.  Neck: Normal range of motion. Neck supple.  Pulmonary/Chest: Effort normal. He has wheezes.  Abdominal: Soft. He exhibits no mass.  Musculoskeletal: He exhibits edema.  Neurological: He is alert and oriented to person, place, and time.  Skin:  Multiple wounds right lateral leg  Psychiatric: He has a normal mood and affect. His behavior is normal. Judgment and thought content normal.    Data: He has no new studies today.     Assessment/Plan:     67 year old male with multifactorial right lateral leg wound [facility].  He was offered angiogram in the past presents today to discuss.  We will get him set up for aortogram with right lower extremity runoff possible intervention in the near future.  After establishing arterial inflow we can reevaluate his venous disease on the right.  He demonstrates good understanding we will schedule him today.     Waynetta Sandy MD [facility]

## 2017-08-11 DIAGNOSIS — M1990 Unspecified osteoarthritis, unspecified site: Secondary | ICD-10-CM | POA: Diagnosis not present

## 2017-08-11 DIAGNOSIS — I10 Essential (primary) hypertension: Secondary | ICD-10-CM | POA: Diagnosis not present

## 2017-08-11 DIAGNOSIS — F4310 Post-traumatic stress disorder, unspecified: Secondary | ICD-10-CM | POA: Diagnosis not present

## 2017-08-11 DIAGNOSIS — L97211 Non-pressure chronic ulcer of right calf limited to breakdown of skin: Secondary | ICD-10-CM | POA: Diagnosis not present

## 2017-08-11 DIAGNOSIS — I872 Venous insufficiency (chronic) (peripheral): Secondary | ICD-10-CM | POA: Diagnosis not present

## 2017-08-11 DIAGNOSIS — G8929 Other chronic pain: Secondary | ICD-10-CM | POA: Diagnosis not present

## 2017-08-11 DIAGNOSIS — J449 Chronic obstructive pulmonary disease, unspecified: Secondary | ICD-10-CM | POA: Diagnosis not present

## 2017-08-11 DIAGNOSIS — S81801D Unspecified open wound, right lower leg, subsequent encounter: Secondary | ICD-10-CM | POA: Diagnosis not present

## 2017-08-11 DIAGNOSIS — E119 Type 2 diabetes mellitus without complications: Secondary | ICD-10-CM | POA: Diagnosis not present

## 2017-08-15 DIAGNOSIS — S81801D Unspecified open wound, right lower leg, subsequent encounter: Secondary | ICD-10-CM | POA: Diagnosis not present

## 2017-08-15 DIAGNOSIS — I10 Essential (primary) hypertension: Secondary | ICD-10-CM | POA: Diagnosis not present

## 2017-08-15 DIAGNOSIS — I872 Venous insufficiency (chronic) (peripheral): Secondary | ICD-10-CM | POA: Diagnosis not present

## 2017-08-15 DIAGNOSIS — L97211 Non-pressure chronic ulcer of right calf limited to breakdown of skin: Secondary | ICD-10-CM | POA: Diagnosis not present

## 2017-08-15 DIAGNOSIS — E119 Type 2 diabetes mellitus without complications: Secondary | ICD-10-CM | POA: Diagnosis not present

## 2017-08-15 DIAGNOSIS — F4310 Post-traumatic stress disorder, unspecified: Secondary | ICD-10-CM | POA: Diagnosis not present

## 2017-08-15 DIAGNOSIS — J449 Chronic obstructive pulmonary disease, unspecified: Secondary | ICD-10-CM | POA: Diagnosis not present

## 2017-08-15 DIAGNOSIS — M1990 Unspecified osteoarthritis, unspecified site: Secondary | ICD-10-CM | POA: Diagnosis not present

## 2017-08-15 DIAGNOSIS — G8929 Other chronic pain: Secondary | ICD-10-CM | POA: Diagnosis not present

## 2017-08-18 ENCOUNTER — Other Ambulatory Visit: Payer: Self-pay | Admitting: Family Medicine

## 2017-08-18 DIAGNOSIS — G8929 Other chronic pain: Secondary | ICD-10-CM | POA: Diagnosis not present

## 2017-08-18 DIAGNOSIS — J449 Chronic obstructive pulmonary disease, unspecified: Secondary | ICD-10-CM | POA: Diagnosis not present

## 2017-08-18 DIAGNOSIS — E119 Type 2 diabetes mellitus without complications: Secondary | ICD-10-CM | POA: Diagnosis not present

## 2017-08-18 DIAGNOSIS — I872 Venous insufficiency (chronic) (peripheral): Secondary | ICD-10-CM | POA: Diagnosis not present

## 2017-08-18 DIAGNOSIS — F4310 Post-traumatic stress disorder, unspecified: Secondary | ICD-10-CM | POA: Diagnosis not present

## 2017-08-18 DIAGNOSIS — J441 Chronic obstructive pulmonary disease with (acute) exacerbation: Secondary | ICD-10-CM

## 2017-08-18 DIAGNOSIS — I10 Essential (primary) hypertension: Secondary | ICD-10-CM | POA: Diagnosis not present

## 2017-08-18 DIAGNOSIS — S81801D Unspecified open wound, right lower leg, subsequent encounter: Secondary | ICD-10-CM | POA: Diagnosis not present

## 2017-08-18 DIAGNOSIS — L97211 Non-pressure chronic ulcer of right calf limited to breakdown of skin: Secondary | ICD-10-CM | POA: Diagnosis not present

## 2017-08-18 DIAGNOSIS — M1990 Unspecified osteoarthritis, unspecified site: Secondary | ICD-10-CM | POA: Diagnosis not present

## 2017-08-20 ENCOUNTER — Ambulatory Visit (HOSPITAL_COMMUNITY)
Admission: RE | Admit: 2017-08-20 | Discharge: 2017-08-20 | Disposition: A | Discharge status: Home / Self Care | Payer: Medicare HMO | Source: Ambulatory Visit | Attending: Vascular Surgery | Admitting: Vascular Surgery

## 2017-08-20 ENCOUNTER — Ambulatory Visit (HOSPITAL_COMMUNITY)
Admission: RE | Disposition: A | Discharge status: Home / Self Care | Payer: Self-pay | Source: Ambulatory Visit | Attending: Vascular Surgery

## 2017-08-20 ENCOUNTER — Telehealth: Payer: Self-pay | Admitting: Vascular Surgery

## 2017-08-20 ENCOUNTER — Encounter (HOSPITAL_COMMUNITY): Payer: Self-pay | Admitting: Vascular Surgery

## 2017-08-20 DIAGNOSIS — E785 Hyperlipidemia, unspecified: Secondary | ICD-10-CM | POA: Diagnosis not present

## 2017-08-20 DIAGNOSIS — Z7902 Long term (current) use of antithrombotics/antiplatelets: Secondary | ICD-10-CM | POA: Diagnosis not present

## 2017-08-20 DIAGNOSIS — Z8673 Personal history of transient ischemic attack (TIA), and cerebral infarction without residual deficits: Secondary | ICD-10-CM | POA: Insufficient documentation

## 2017-08-20 DIAGNOSIS — E039 Hypothyroidism, unspecified: Secondary | ICD-10-CM | POA: Diagnosis not present

## 2017-08-20 DIAGNOSIS — F17210 Nicotine dependence, cigarettes, uncomplicated: Secondary | ICD-10-CM | POA: Diagnosis not present

## 2017-08-20 DIAGNOSIS — I70238 Atherosclerosis of native arteries of right leg with ulceration of other part of lower right leg: Secondary | ICD-10-CM | POA: Insufficient documentation

## 2017-08-20 DIAGNOSIS — J449 Chronic obstructive pulmonary disease, unspecified: Secondary | ICD-10-CM | POA: Diagnosis not present

## 2017-08-20 DIAGNOSIS — Z823 Family history of stroke: Secondary | ICD-10-CM | POA: Insufficient documentation

## 2017-08-20 DIAGNOSIS — Z794 Long term (current) use of insulin: Secondary | ICD-10-CM | POA: Diagnosis not present

## 2017-08-20 DIAGNOSIS — Z79890 Hormone replacement therapy (postmenopausal): Secondary | ICD-10-CM | POA: Insufficient documentation

## 2017-08-20 DIAGNOSIS — L97819 Non-pressure chronic ulcer of other part of right lower leg with unspecified severity: Secondary | ICD-10-CM | POA: Insufficient documentation

## 2017-08-20 DIAGNOSIS — I714 Abdominal aortic aneurysm, without rupture: Secondary | ICD-10-CM | POA: Insufficient documentation

## 2017-08-20 DIAGNOSIS — Z79899 Other long term (current) drug therapy: Secondary | ICD-10-CM | POA: Insufficient documentation

## 2017-08-20 DIAGNOSIS — E1151 Type 2 diabetes mellitus with diabetic peripheral angiopathy without gangrene: Secondary | ICD-10-CM | POA: Insufficient documentation

## 2017-08-20 HISTORY — PX: ABDOMINAL AORTOGRAM W/LOWER EXTREMITY: CATH118223

## 2017-08-20 HISTORY — PX: PERIPHERAL VASCULAR ATHERECTOMY: CATH118256

## 2017-08-20 HISTORY — PX: PERIPHERAL VASCULAR BALLOON ANGIOPLASTY: CATH118281

## 2017-08-20 LAB — POCT I-STAT, CHEM 8
BUN: 13 mg/dL (ref 6–20)
CHLORIDE: 101 mmol/L (ref 101–111)
CREATININE: 0.7 mg/dL (ref 0.61–1.24)
Calcium, Ion: 1.14 mmol/L — ABNORMAL LOW (ref 1.15–1.40)
GLUCOSE: 77 mg/dL (ref 65–99)
HCT: 49 % (ref 39.0–52.0)
Hemoglobin: 16.7 g/dL (ref 13.0–17.0)
Potassium: 4 mmol/L (ref 3.5–5.1)
Sodium: 141 mmol/L (ref 135–145)
TCO2: 30 mmol/L (ref 22–32)

## 2017-08-20 LAB — GLUCOSE, CAPILLARY: GLUCOSE-CAPILLARY: 61 mg/dL — AB (ref 65–99)

## 2017-08-20 LAB — POCT ACTIVATED CLOTTING TIME: Activated Clotting Time: 230 seconds

## 2017-08-20 SURGERY — ABDOMINAL AORTOGRAM W/LOWER EXTREMITY
Anesthesia: LOCAL | Laterality: Right

## 2017-08-20 MED ORDER — OXYCODONE HCL 5 MG PO TABS: 5.0000 mg | ORAL_TABLET | ORAL | Status: DC | PRN

## 2017-08-20 MED ORDER — NITROGLYCERIN IN D5W 200-5 MCG/ML-% IV SOLN
INTRAVENOUS | Status: AC
  Filled 2017-08-20: qty 250

## 2017-08-20 MED ORDER — HEPARIN SODIUM (PORCINE) 1000 UNIT/ML IJ SOLN
INTRAMUSCULAR | Status: AC
  Filled 2017-08-20: qty 1

## 2017-08-20 MED ORDER — HYDRALAZINE HCL 20 MG/ML IJ SOLN
5.0000 mg | INTRAMUSCULAR | Status: AC | PRN
  Administered 2017-08-20 (×2): 5 mg via INTRAVENOUS

## 2017-08-20 MED ORDER — MIDAZOLAM HCL 2 MG/2ML IJ SOLN
INTRAMUSCULAR | Status: DC | PRN
  Administered 2017-08-20: 1 mg via INTRAVENOUS
  Administered 2017-08-20 (×2): 0.5 mg via INTRAVENOUS

## 2017-08-20 MED ORDER — ASPIRIN EC 81 MG PO TBEC: 81.0000 mg | DELAYED_RELEASE_TABLET | Freq: Every day | ORAL | Status: DC

## 2017-08-20 MED ORDER — HYDRALAZINE HCL 20 MG/ML IJ SOLN
INTRAMUSCULAR | Status: AC
  Administered 2017-08-20: 5 mg via INTRAVENOUS
  Filled 2017-08-20: qty 1

## 2017-08-20 MED ORDER — SODIUM CHLORIDE 0.9 % WEIGHT BASED INFUSION: 1.0000 mL/kg/h | INTRAVENOUS | Status: DC

## 2017-08-20 MED ORDER — VIPERSLIDE LUBRICANT OPTIME
TOPICAL | Status: DC | PRN
  Administered 2017-08-20: 10:00:00 via SURGICAL_CAVITY

## 2017-08-20 MED ORDER — SODIUM CHLORIDE 0.9 % IV SOLN
INTRAVENOUS | Status: DC
  Administered 2017-08-20: 07:00:00 via INTRAVENOUS

## 2017-08-20 MED ORDER — SODIUM CHLORIDE 0.9% FLUSH: 3.0000 mL | Freq: Two times a day (BID) | INTRAVENOUS | Status: DC

## 2017-08-20 MED ORDER — SODIUM CHLORIDE 0.9% FLUSH: 3.0000 mL | INTRAVENOUS | Status: DC | PRN

## 2017-08-20 MED ORDER — FENTANYL CITRATE (PF) 100 MCG/2ML IJ SOLN
INTRAMUSCULAR | Status: DC | PRN
  Administered 2017-08-20 (×4): 50 ug via INTRAVENOUS

## 2017-08-20 MED ORDER — HEPARIN SODIUM (PORCINE) 1000 UNIT/ML IJ SOLN
INTRAMUSCULAR | Status: DC | PRN
  Administered 2017-08-20: 12000 [IU] via INTRAVENOUS
  Administered 2017-08-20: 2000 [IU] via INTRAVENOUS

## 2017-08-20 MED ORDER — LIDOCAINE HCL (PF) 1 % IJ SOLN
INTRAMUSCULAR | Status: AC
  Filled 2017-08-20: qty 30

## 2017-08-20 MED ORDER — SODIUM CHLORIDE 0.9 % IV SOLN: 250.0000 mL | INTRAVENOUS | Status: DC | PRN

## 2017-08-20 MED ORDER — FENTANYL CITRATE (PF) 100 MCG/2ML IJ SOLN
INTRAMUSCULAR | Status: AC
  Filled 2017-08-20: qty 2

## 2017-08-20 MED ORDER — VERAPAMIL HCL 2.5 MG/ML IV SOLN
INTRAVENOUS | Status: AC
  Filled 2017-08-20: qty 2

## 2017-08-20 MED ORDER — ASPIRIN 81 MG PO CHEW
CHEWABLE_TABLET | ORAL | Status: AC
  Administered 2017-08-20: 81 mg
  Filled 2017-08-20: qty 1

## 2017-08-20 MED ORDER — LABETALOL HCL 5 MG/ML IV SOLN: 10.0000 mg | INTRAVENOUS | Status: DC | PRN

## 2017-08-20 MED ORDER — HEPARIN (PORCINE) IN NACL 2-0.9 UNIT/ML-% IJ SOLN
INTRAMUSCULAR | Status: AC
  Filled 2017-08-20: qty 1000

## 2017-08-20 MED ORDER — LIDOCAINE HCL (PF) 1 % IJ SOLN
INTRAMUSCULAR | Status: DC | PRN
  Administered 2017-08-20: 20 mL

## 2017-08-20 MED ORDER — IODIXANOL 320 MG/ML IV SOLN
INTRAVENOUS | Status: DC | PRN
  Administered 2017-08-20: 160 mg via INTRA_ARTERIAL

## 2017-08-20 MED ORDER — MIDAZOLAM HCL 2 MG/2ML IJ SOLN
INTRAMUSCULAR | Status: AC
  Filled 2017-08-20: qty 2

## 2017-08-20 SURGICAL SUPPLY — 33 items
BAG SNAP BAND KOVER 36X36 (MISCELLANEOUS) ×3 IMPLANT
BALLN COYOTE OTW 3X220X150 (BALLOONS) ×3
BALLN COYOTE OTW 3X80X150 (BALLOONS) ×3
BALLOON COYOTE OTW 3X220X150 (BALLOONS) ×2 IMPLANT
BALLOON COYOTE OTW 3X80X150 (BALLOONS) ×2 IMPLANT
CATH ANGIO 5F BER2 65CM (CATHETERS) ×3 IMPLANT
CATH CXI SUPP ANG 2.6FR 150CM (MICROCATHETER) ×3 IMPLANT
CATH OMNI FLUSH 5F 65CM (CATHETERS) ×3 IMPLANT
CATH QUICKCROSS SUPP .035X90CM (MICROCATHETER) ×3 IMPLANT
COVER DOME SNAP 22 D (MISCELLANEOUS) ×3 IMPLANT
COVER PRB 48X5XTLSCP FOLD TPE (BAG) ×2 IMPLANT
COVER PROBE 5X48 (BAG) ×1
DEVICE CLOSURE MYNXGRIP 6/7F (Vascular Products) ×3 IMPLANT
DEVICE TORQUE .025-.038 (MISCELLANEOUS) ×3 IMPLANT
DIAMONDBACK CLASSIC OAS 1.5MM (CATHETERS) ×3
GLIDEWIRE ADV .035X260CM (WIRE) ×3 IMPLANT
GUIDEWIRE ANGLED .035X260CM (WIRE) ×3 IMPLANT
KIT ENCORE 26 ADVANTAGE (KITS) ×3 IMPLANT
KIT MICROINTRODUCER STIFF 5F (SHEATH) ×3 IMPLANT
KIT PV (KITS) ×3 IMPLANT
LUBRICANT VIPERSLIDE CORONARY (MISCELLANEOUS) ×3 IMPLANT
SHEATH HIGHFLEX ANSEL 6FRX55 (SHEATH) ×3 IMPLANT
SHEATH PINNACLE 5F 10CM (SHEATH) ×3 IMPLANT
SHEATH PINNACLE 6F 10CM (SHEATH) ×3 IMPLANT
SHIELD RADPAD SCOOP 12X17 (MISCELLANEOUS) ×3 IMPLANT
SYR MEDRAD MARK V 150ML (SYRINGE) ×3 IMPLANT
SYSTEM DIMNDBCK CLSC OAS 1.5MM (CATHETERS) ×2 IMPLANT
TRANSDUCER W/STOPCOCK (MISCELLANEOUS) ×3 IMPLANT
TRAY PV CATH (CUSTOM PROCEDURE TRAY) ×3 IMPLANT
WIRE BENTSON .035X145CM (WIRE) ×3 IMPLANT
WIRE G V18X300CM (WIRE) ×3 IMPLANT
WIRE ROSEN-J .035X260CM (WIRE) ×3 IMPLANT
WIRE VIPER ADVANCE .017X335CM (WIRE) ×3 IMPLANT

## 2017-08-20 NOTE — Discharge Instructions (Signed)

## 2017-08-20 NOTE — Telephone Encounter (Signed)
-----   Message from Mena Goes, RN sent at 08/20/2017 11:50 AM EST ----- Regarding: 6 weeks w/ 2 labs   ----- Message ----- From: Waynetta Sandy, MD Sent: 08/20/2017  10:40 AM To: [facility] [address]  Brad Taylor [id number] January 08, 1951  08/20/2017 Pre-operative Diagnosis: critical right lower extremity ischemia with right anterior leg ulceration  Surgeon:  [name], MD  Procedure Performed: 1.  US guided cannulation of left common femoral artery 2.  Aortogram with bilateral lower extremity runoff 3.  Atherectomy with 1.5 classic csi of right tibioperoneal trunk and posterior tibial artery 4.  Balloon angioplasty of right tp trunk, pt and AT arteries with 75mm balloon 5.  Moderate sedation with fentanyl and versed for 108 minutes 6.  Percutaneous closure of left common femoral access site with mynx grip  F/u in 6 weeks with RLE duplex and ABI

## 2017-08-20 NOTE — Op Note (Signed)
Patient name: Brad Taylor MRN: [id number] DOB: 02-24-1951 Sex: male  08/20/2017 Pre-operative Diagnosis: critical right lower extremity ischemia with right anterior leg ulceration Post-operative diagnosis:  Same Surgeon:  [name], MD Procedure Performed: 1.  US guided cannulation of left common femoral artery 2.  Aortogram with bilateral lower extremity runoff 3.  Atherectomy with 1.5 classic csi of right tibioperoneal trunk and posterior tibial artery 4.  Balloon angioplasty of right tp trunk, pt and AT arteries with 77mm balloon 5.  Moderate sedation with fentanyl and versed for 108 minutes 6.  Percutaneous closure of left common femoral access site with mynx grip   Indications: 67 year old male history of right lower extremity wound that is likely mixed venous and arterial in nature.  He is a chronic smoker but does take Plavix and statin daily.  He is now indicated for angina with possible intervention of the right lower extremity.  Findings: There is a small aneurysm in the aorta and the aortoiliac segments are heavily calcified with 50% stenosis at least in the right external iliac artery and likely in the left as well.  These were non-flow-limiting.  Common femoral arteries also appear to have disease at approximately 50%.  Left lower extremity the SFA is patent and appears to be dominant runoff via the posterior tibial artery although is incompletely visualized.  The right lower extremity has a 50% lesion in the SFA that is non-flow-limiting.  The tibioperoneal trunk is heavily calcified with approximately 70% lesion in the posterior tibial artery is subtotally occluded proximally.  After atherectomy and balloon angioplasty of these vessels there is no flow-limiting dissection and 0% residual stenosis.  The anterior tibial artery occluded just after its takeoff reconstituted at the level of the foot.  After balloon angioplasty of this there was runoff to the foot directly with  no flow-limiting stenosis and no dissection.   Procedure:  The patient was identified in the holding area and taken to room 8.  The patient was then placed supine on the table and prepped and draped in the usual sterile fashion.  A time out was called.  Ultrasound was used to evaluate the left common femoral artery.  It was patent .  A digital ultrasound image was acquired.  A micropuncture needle was used to access the left common femoral artery under ultrasound guidance.  An 018 wire was advanced without resistance and a micropuncture sheath was placed.  The 018 wire was removed and a benson wire was placed.  The micropuncture sheath was exchanged for a 5 french sheath.  An omniflush catheter was advanced over the wire to the level of L-1.  An abdominal angiogram was obtained followed by bilateral lower extremity runoff with the above findings.  Across the bifurcation we did have to use a first and Omni catheter followed by a Glidewire advantage and a quick cross catheter.  We were then able to place a long 6 French sheath into the right-sided SFA and the patient was heparinized.  We used the V 18 wire and CXI catheter to cross the stenosed tibial peroneal trunk as well as nearly occluded posterior tibial artery.  We then performed atherectomy with CSI followed by 3 mm balloon angioplasty.  There was initially some question of dissection proximally and a new 3 mm balloon was placed at low pressure and this resolved.  We then went after the anterior tibial artery.  We were able to cross sub-intimately with a V 18 wire and  then we ballooned a long segment of the anterior tibial artery with a long 3 mm balloon.  At completion there was some concern for dissection but then this was ballooned at low pressure as well and we then had brisk runoff with no flow-limiting dissection and no stenosis.  We then performed a direct shot of the right external iliac artery to look for possible stenosis there but there is only 50%  and we did not intervene.  Patient did tolerate this procedure well without immediate complication.  Contrast: 160cc  Modene Andy C. [name], MD [facility] [place] Office: [phone] Pager: [phone]

## 2017-08-20 NOTE — Progress Notes (Signed)
Pt bp continues to be elevated, Dr [name] was updated and wants one more dose of hydralazine  given.

## 2017-08-20 NOTE — Telephone Encounter (Signed)
Sched labs 10/01/17 at 3:00 and MD 10/03/17 at 2:30. Spoke to pt's wife to inform them of appts.

## 2017-08-20 NOTE — H&P (Signed)
   History and Physical Update  The patient was interviewed and re-examined.  The patient's previous History and Physical has been reviewed and is unchanged from recent office visit. Plan for aortogram with runoff and possible intervention on right.   Brandon C. [name], MD [facility] [place] Office: [phone] Pager: [phone]   08/20/2017, 7:15 AM

## 2017-08-21 ENCOUNTER — Other Ambulatory Visit: Payer: Self-pay

## 2017-08-21 DIAGNOSIS — Z48812 Encounter for surgical aftercare following surgery on the circulatory system: Secondary | ICD-10-CM

## 2017-08-21 DIAGNOSIS — I998 Other disorder of circulatory system: Secondary | ICD-10-CM

## 2017-08-21 MED FILL — Heparin Sodium (Porcine) 2 Unit/ML in Sodium Chloride 0.9%: INTRAMUSCULAR | Qty: 1000 | Status: AC

## 2017-08-22 DIAGNOSIS — I872 Venous insufficiency (chronic) (peripheral): Secondary | ICD-10-CM | POA: Diagnosis not present

## 2017-08-22 DIAGNOSIS — E119 Type 2 diabetes mellitus without complications: Secondary | ICD-10-CM | POA: Diagnosis not present

## 2017-08-22 DIAGNOSIS — L97211 Non-pressure chronic ulcer of right calf limited to breakdown of skin: Secondary | ICD-10-CM | POA: Diagnosis not present

## 2017-08-22 DIAGNOSIS — J449 Chronic obstructive pulmonary disease, unspecified: Secondary | ICD-10-CM | POA: Diagnosis not present

## 2017-08-22 DIAGNOSIS — I10 Essential (primary) hypertension: Secondary | ICD-10-CM | POA: Diagnosis not present

## 2017-08-22 DIAGNOSIS — G8929 Other chronic pain: Secondary | ICD-10-CM | POA: Diagnosis not present

## 2017-08-22 DIAGNOSIS — S81801D Unspecified open wound, right lower leg, subsequent encounter: Secondary | ICD-10-CM | POA: Diagnosis not present

## 2017-08-22 DIAGNOSIS — F4310 Post-traumatic stress disorder, unspecified: Secondary | ICD-10-CM | POA: Diagnosis not present

## 2017-08-22 DIAGNOSIS — M1990 Unspecified osteoarthritis, unspecified site: Secondary | ICD-10-CM | POA: Diagnosis not present

## 2017-08-26 ENCOUNTER — Encounter (HOSPITAL_COMMUNITY): Payer: Self-pay | Admitting: Vascular Surgery

## 2017-08-29 DIAGNOSIS — F4310 Post-traumatic stress disorder, unspecified: Secondary | ICD-10-CM | POA: Diagnosis not present

## 2017-08-29 DIAGNOSIS — J449 Chronic obstructive pulmonary disease, unspecified: Secondary | ICD-10-CM | POA: Diagnosis not present

## 2017-08-29 DIAGNOSIS — L97211 Non-pressure chronic ulcer of right calf limited to breakdown of skin: Secondary | ICD-10-CM | POA: Diagnosis not present

## 2017-08-29 DIAGNOSIS — I872 Venous insufficiency (chronic) (peripheral): Secondary | ICD-10-CM | POA: Diagnosis not present

## 2017-08-29 DIAGNOSIS — S81801D Unspecified open wound, right lower leg, subsequent encounter: Secondary | ICD-10-CM | POA: Diagnosis not present

## 2017-08-29 DIAGNOSIS — G8929 Other chronic pain: Secondary | ICD-10-CM | POA: Diagnosis not present

## 2017-08-29 DIAGNOSIS — M1990 Unspecified osteoarthritis, unspecified site: Secondary | ICD-10-CM | POA: Diagnosis not present

## 2017-08-29 DIAGNOSIS — I10 Essential (primary) hypertension: Secondary | ICD-10-CM | POA: Diagnosis not present

## 2017-08-29 DIAGNOSIS — E119 Type 2 diabetes mellitus without complications: Secondary | ICD-10-CM | POA: Diagnosis not present

## 2017-09-01 ENCOUNTER — Ambulatory Visit (INDEPENDENT_AMBULATORY_CARE_PROVIDER_SITE_OTHER): Payer: Medicare HMO | Admitting: Family Medicine

## 2017-09-01 ENCOUNTER — Encounter: Payer: Self-pay | Admitting: Family Medicine

## 2017-09-01 VITALS — BP 130/68 | HR 80 | Temp 98.2°F | Resp 18 | Ht 70.75 in | Wt 256.0 lb

## 2017-09-01 DIAGNOSIS — Z125 Encounter for screening for malignant neoplasm of prostate: Secondary | ICD-10-CM | POA: Diagnosis not present

## 2017-09-01 DIAGNOSIS — I83013 Varicose veins of right lower extremity with ulcer of ankle: Secondary | ICD-10-CM | POA: Diagnosis not present

## 2017-09-01 DIAGNOSIS — R509 Fever, unspecified: Secondary | ICD-10-CM | POA: Diagnosis not present

## 2017-09-01 DIAGNOSIS — L97315 Non-pressure chronic ulcer of right ankle with muscle involvement without evidence of necrosis: Secondary | ICD-10-CM | POA: Diagnosis not present

## 2017-09-01 DIAGNOSIS — R5382 Chronic fatigue, unspecified: Secondary | ICD-10-CM | POA: Diagnosis not present

## 2017-09-01 DIAGNOSIS — Z1321 Encounter for screening for nutritional disorder: Secondary | ICD-10-CM | POA: Diagnosis not present

## 2017-09-01 DIAGNOSIS — Z1329 Encounter for screening for other suspected endocrine disorder: Secondary | ICD-10-CM | POA: Diagnosis not present

## 2017-09-01 DIAGNOSIS — Z0000 Encounter for general adult medical examination without abnormal findings: Secondary | ICD-10-CM | POA: Diagnosis not present

## 2017-09-01 MED ORDER — SULFAMETHOXAZOLE-TRIMETHOPRIM 800-160 MG PO TABS
1.0000 | ORAL_TABLET | Freq: Two times a day (BID) | ORAL | 0 refills | Status: DC
Start: 2017-09-01 — End: 2017-10-23

## 2017-09-01 NOTE — Progress Notes (Signed)
Subjective:    Patient ID: Brad Taylor, male    DOB: 1950/11/09, 67 y.o.   MRN: [id number]  HPI  07/2017 Patient presents with 1 week of worsening chest congestion, audible wheezing, increasing shortness of breath, and head congestion.  On examination today, the patient has markedly diminished breath sounds left greater than right.  He has diffuse rhonchorous breath sounds but he has right basilar rales heard posteriorly.  He has diffuse expiratory wheezing.  He has diminished breath sounds bilaterally.  He has not been using albuterol.  He denies any hemoptysis.  He denies any chest pain.  He denies any fevers or chills or pleurisy.  At that time, my plan was: Patient appears to have a COPD exacerbation.  Begin prednisone taper pack for the bronchospasms appreciated on exam in addition to albuterol 2 puffs every 6 hours as needed.  Add Levaquin 500 mg p.o. daily for 7 days particularly given the rales appreciated in the right lower lobe.  Recheck next week or sooner if worse.  09/01/17 Patient presents with 2-3 weeks of hot and cold flashes. It sounds like he may be running fevers. He will start sweating for no reason. He will wake up drenched with sweat. They have not checked his temperature. During that same time, he reports worsening constipation. He is on morphine to the care of the [facility]. He is increase the amount of morphine that he is taking due to pain in his right leg. On examination, there is a large wound,/venous stasis ulcer on the lateral aspect of his right ankle. It is actually a complex of several small wounds of coalesced and one large. The superior wound is 11 cm x 6 cm in an "I" shaped configuration. The smaller wound is a 6 cm circular ulcer down to the underlying muscle/fascia. There is a foul odor emanating from the wound. There is opaque yellowish-green exudate coming from the wound. The surrounding skin is erythematous and warm and painful. It appears to be secondarily infected.  I believe this may be the source of his hot and cold flashes and suspected fevers. Recently saw vascular surgery who improve the blood flow in his right leg. Hopefully this will help with this ulcer and healing but I do believe the patient is at high risk for losing his extremity if the wound will not heal.   Past Medical History:  Diagnosis Date  . AAA (abdominal aortic aneurysm) ([place]) 5/15   3.5x3.5 cm  . COPD (chronic obstructive pulmonary disease) ([place])   . Depression   . Diabetes mellitus without complication ([place])   . GERD (gastroesophageal reflux disease)   . Hyperlipidemia   . Hypothyroidism   . Post traumatic stress disorder   . Stroke ([place])   . Thyroid disease   . Ulcer    Past Surgical History:  Procedure Laterality Date  . ABDOMINAL AORTOGRAM W/LOWER EXTREMITY N/A 08/20/2017   Procedure: ABDOMINAL AORTOGRAM W/LOWER EXTREMITY;  Surgeon: Waynetta Sandy, MD;  Location: [facility];  Service: Cardiovascular;  Laterality: N/A;  . CATARACT EXTRACTION    . CHOLECYSTECTOMY    . KNEE SURGERY    . PERIPHERAL VASCULAR ATHERECTOMY Right 08/20/2017   Procedure: PERIPHERAL VASCULAR ATHERECTOMY;  Surgeon: Waynetta Sandy, MD;  Location: [facility];  Service: Cardiovascular;  Laterality: Right;  posterior tibial and tibeoperoneal trunk  . PERIPHERAL VASCULAR BALLOON ANGIOPLASTY Right 08/20/2017   Procedure: PERIPHERAL VASCULAR BALLOON ANGIOPLASTY;  Surgeon: Waynetta Sandy, MD;  Location: [facility];  Service: Cardiovascular;  Laterality: Right;  Anterior tibial   Current Outpatient Medications on File Prior to Visit  Medication Sig Dispense Refill  . albuterol (PROVENTIL HFA;VENTOLIN HFA) 108 (90 Base) MCG/ACT inhaler INHALE 2 PUFFS INTO THE LUNGS EVERY 6 HOURS AS NEEDED FOR WHEEZE OR SHORTNESS OF BREATH 18 g 3  . atorvastatin (LIPITOR) 10 MG tablet Take 5 mg by mouth daily. Takes 5 mg     . buPROPion (WELLBUTRIN SR) 150 MG 12 hr tablet  Take 1 tablet (150 mg total) by mouth daily. 30 tablet 1  . busPIRone (BUSPAR) 10 MG tablet Take 0.5 tablets (5 mg total) by mouth 2 (two) times daily as needed (for anxiety). (Patient taking differently: Take 5 mg by mouth 4 (four) times daily. ) 60 tablet 0  . clopidogrel (PLAVIX) 75 MG tablet Take 1 tablet (75 mg total) by mouth daily with breakfast. 90 tablet 3  . insulin glargine (LANTUS) 100 UNIT/ML injection Inject 40 Units into the skin 2 (two) times daily. s    . Insulin Pen Needle (PEN NEEDLES 5/16") 30G X 8 MM MISC Use with insulin pen daily DX:E11.9 100 each 5  . levothyroxine (SYNTHROID, LEVOTHROID) 125 MCG tablet Take 1 tablet (125 mcg total) by mouth daily before breakfast. 30 tablet 1  . metFORMIN (GLUCOPHAGE) 500 MG tablet Take 500 mg by mouth 2 (two) times daily with a meal.    . morphine (MS CONTIN) 100 MG 12 hr tablet Take 100 mg by mouth every 12 (twelve) hours.    . Multiple Vitamins-Minerals (MULTIVITAMIN WITH MINERALS) tablet Take 1 tablet by mouth daily.    . RABEprazole (ACIPHEX) 20 MG tablet Take 2 tablets (40 mg total) by mouth daily before breakfast. 30 tablet 1  . tamsulosin (FLOMAX) 0.4 MG CAPS capsule Take 0.4 mg by mouth daily.    . traZODone (DESYREL) 100 MG tablet Take 3 tablets (300 mg total) by mouth at bedtime. 30 tablet 0  . venlafaxine XR (EFFEXOR-XR) 150 MG 24 hr capsule Take 1 capsule (150 mg total) by mouth daily with breakfast. (Patient taking differently: 150 mg 2 (two) times daily. ) 30 capsule 1   No current facility-administered medications on file prior to visit.    No Known Allergies Social History   Socioeconomic History  . Marital status: Married    Spouse name: Diane  . Number of children: 1  . Years of education: HS  . Highest education level: Not on file  Social Needs  . Financial resource strain: Not on file  . Food insecurity - worry: Not on file  . Food insecurity - inability: Not on file  . Transportation needs - medical: Not on  file  . Transportation needs - non-medical: Not on file  Occupational History  . Occupation: Retired  Tobacco Use  . Smoking status: Current Every Day Smoker    Packs/day: 1.00    Years: 30.00    Pack years: 30.00    Types: Cigarettes  . Smokeless tobacco: Former Systems developer    Quit date: 05/04/2013  Substance and Sexual Activity  . Alcohol use: No    Comment: Former heavy drinker, been sober for 10 years  . Drug use: No  . Sexual activity: Not on file  Other Topics Concern  . Not on file  Social History Narrative   Patient lives at home with spouse.   Caffeine Use: 3-4 cups daily   Retired Dealer.  Review of Systems  Gastrointestinal: Positive for constipation.  All other systems reviewed and are negative.      Objective:   Physical Exam  Constitutional: He appears well-developed and well-nourished. No distress.  HENT:  Right Ear: External ear normal.  Left Ear: External ear normal.  Nose: Nose normal.  Mouth/Throat: Oropharynx is clear and moist. No oropharyngeal exudate.  Eyes: Conjunctivae are normal.  Neck: Neck supple.  Cardiovascular: Normal rate, regular rhythm and normal heart sounds.  No murmur heard. Pulmonary/Chest: No accessory muscle usage. No respiratory distress. He has wheezes.  Musculoskeletal:       Right lower leg: He exhibits tenderness, swelling and deformity.       Legs: Skin: Lesion noted. He is not diaphoretic. There is erythema.  Vitals reviewed.         Assessment & Plan:  Fever, unknown origin - Plan: CBC with Differential/Platelet, COMPLETE METABOLIC PANEL WITH GFR, PSA, TSH, Testosterone Total,Free,Bio, Males, Urinalysis, Routine w reflex microscopic, sulfamethoxazole-trimethoprim (BACTRIM DS,SEPTRA DS) 800-160 MG tablet  I believe the patient has a venous stasis ulcer that has become secondarily infected. It is been a nonhealing ulcer now for more than a year and cared for at the [facility] and I believe the nonhealing aspect is likely  due to arterial insufficiency. Begin Bactrim double strength tablets 1 by mouth twice a day and recheck later this week to see if the cellulitis is improving. Consult wound clinic as soon as possible to help facilitate healing of the wound. Obtain lab work to workup other potential causes of fever of unknown origin. Start movantik 25 mg a day for opiate-induced constipation. I believe his sleeping is likely due to the fact he states he more morphine. He apparently was screen for obstructive sleep apnea at the [facility] last year and was found to not have that according to the patient.

## 2017-09-01 NOTE — Addendum Note (Signed)
Addended by: Jenna Luo T on: 09/01/2017 09:41 AM   Modules accepted: Orders

## 2017-09-02 LAB — CBC WITH DIFFERENTIAL/PLATELET
BASOS ABS: 64 {cells}/uL (ref 0–200)
Basophils Relative: 0.5 %
Eosinophils Absolute: 218 cells/uL (ref 15–500)
Eosinophils Relative: 1.7 %
HEMATOCRIT: 42.3 % (ref 38.5–50.0)
Hemoglobin: 13.9 g/dL (ref 13.2–17.1)
LYMPHS ABS: 1498 {cells}/uL (ref 850–3900)
MCH: 29.8 pg (ref 27.0–33.0)
MCHC: 32.9 g/dL (ref 32.0–36.0)
MCV: 90.6 fL (ref 80.0–100.0)
MPV: 10.6 fL (ref 7.5–12.5)
Monocytes Relative: 8.3 %
NEUTROS PCT: 77.8 %
Neutro Abs: 9958 cells/uL — ABNORMAL HIGH (ref 1500–7800)
PLATELETS: 279 10*3/uL (ref 140–400)
RBC: 4.67 10*6/uL (ref 4.20–5.80)
RDW: 12.9 % (ref 11.0–15.0)
TOTAL LYMPHOCYTE: 11.7 %
WBC mixed population: 1062 cells/uL — ABNORMAL HIGH (ref 200–950)
WBC: 12.8 10*3/uL — AB (ref 3.8–10.8)

## 2017-09-02 LAB — COMPLETE METABOLIC PANEL WITH GFR
AG RATIO: 1.4 (calc) (ref 1.0–2.5)
ALKALINE PHOSPHATASE (APISO): 250 U/L — AB (ref 40–115)
ALT: 22 U/L (ref 9–46)
AST: 22 U/L (ref 10–35)
Albumin: 3.7 g/dL (ref 3.6–5.1)
BUN: 12 mg/dL (ref 7–25)
CO2: 31 mmol/L (ref 20–32)
Calcium: 9.2 mg/dL (ref 8.6–10.3)
Chloride: 95 mmol/L — ABNORMAL LOW (ref 98–110)
Creat: 0.88 mg/dL (ref 0.70–1.25)
GFR, Est African American: 102 mL/min/{1.73_m2} (ref >=60)
GFR, Est Non African American: 88 mL/min/{1.73_m2} (ref >=60)
GLOBULIN: 2.7 g/dL (ref 1.9–3.7)
Glucose, Bld: 222 mg/dL — ABNORMAL HIGH (ref 65–99)
POTASSIUM: 5.3 mmol/L (ref 3.5–5.3)
SODIUM: 135 mmol/L (ref 135–146)
Total Bilirubin: 0.6 mg/dL (ref 0.2–1.2)
Total Protein: 6.4 g/dL (ref 6.1–8.1)

## 2017-09-02 LAB — TESTOSTERONE TOTAL,FREE,BIO, MALES
ALBUMIN MSPROF: 3.7 g/dL (ref 3.6–5.1)
Sex Hormone Binding: 42 nmol/L (ref 22–77)
TESTOSTERONE: 84 ng/dL — AB (ref 250–827)

## 2017-09-02 LAB — TSH: TSH: 3.25 mIU/L (ref 0.40–4.50)

## 2017-09-02 LAB — PSA: PSA: 2.8 ng/mL (ref <=4.0)

## 2017-09-04 ENCOUNTER — Ambulatory Visit (INDEPENDENT_AMBULATORY_CARE_PROVIDER_SITE_OTHER): Payer: Medicare HMO | Admitting: Family Medicine

## 2017-09-04 ENCOUNTER — Encounter: Payer: Self-pay | Admitting: Family Medicine

## 2017-09-04 VITALS — BP 110/68 | HR 70 | Temp 98.6°F | Resp 16 | Ht 70.75 in | Wt 250.0 lb

## 2017-09-04 DIAGNOSIS — R509 Fever, unspecified: Secondary | ICD-10-CM

## 2017-09-04 NOTE — Progress Notes (Signed)
Subjective:    Patient ID: Brad Taylor, male    DOB: 1951-06-30, 67 y.o.   MRN: [id number]  Constipation     07/2017 Patient presents with 1 week of worsening chest congestion, audible wheezing, increasing shortness of breath, and head congestion.  On examination today, the patient has markedly diminished breath sounds left greater than right.  He has diffuse rhonchorous breath sounds but he has right basilar rales heard posteriorly.  He has diffuse expiratory wheezing.  He has diminished breath sounds bilaterally.  He has not been using albuterol.  He denies any hemoptysis.  He denies any chest pain.  He denies any fevers or chills or pleurisy.  At that time, my plan was: Patient appears to have a COPD exacerbation.  Begin prednisone taper pack for the bronchospasms appreciated on exam in addition to albuterol 2 puffs every 6 hours as needed.  Add Levaquin 500 mg p.o. daily for 7 days particularly given the rales appreciated in the right lower lobe.  Recheck next week or sooner if worse.  09/01/17 Patient presents with 2-3 weeks of hot and cold flashes. It sounds like he may be running fevers. He will start sweating for no reason. He will wake up drenched with sweat. They have not checked his temperature. During that same time, he reports worsening constipation. He is on morphine to the care of the [facility]. He is increase the amount of morphine that he is taking due to pain in his right leg. On examination, there is a large wound,/venous stasis ulcer on the lateral aspect of his right shin. It is actually a complex of several small wounds of coalesced and one large. The superior wound is 11 cm x 6 cm in an "I" shaped configuration. The smaller wound is a 6 cm circular ulcer down to the underlying muscle/fascia. There is a foul odor emanating from the wound. There is opaque yellowish-green exudate coming from the wound. The surrounding skin is erythematous and warm and painful. It appears to be  secondarily infected. I believe this may be the source of his hot and cold flashes and suspected fevers. Recently saw vascular surgery who improved the blood flow in his right leg.  He underwent atherectomy and balloon angioplasty of the tibioperoneal trunk.  Previously had a heavily calcified 70% stenosis that was improved to 0% after the procedure earlier this month.  Hopefully this will help with this ulcer and healing but I do believe the patient is at high risk for losing his extremity if the wound will not heal.  At that time, my plan was: I believe the patient has a venous stasis ulcer that has become secondarily infected. It is been a nonhealing ulcer now for more than a year and cared for at the [facility] and I believe the nonhealing aspect is likely due to arterial insufficiency. Begin Bactrim double strength tablets 1 by mouth twice a day and recheck later this week to see if the cellulitis is improving. Consult wound clinic as soon as possible to help facilitate healing of the wound. Obtain lab work to workup other potential causes of fever of unknown origin. Start movantik 25 mg a day for opiate-induced constipation. I believe his sleeping is likely due to the fact he states he more morphine. He apparently was screen for obstructive sleep apnea at the [facility] last year and was found to not have that according to the patient.    09/04/17 Patient is doing much better.  Constipation resolved  on movantik.  His leg feels much better and he is having to take less morphine.  He has an appointment to see the wound clinic of Wednesday of next week.  The erythema in the right lower extremity has faded though still present.  The discharge has decreased dramatically.  The smell is much improved.  The warmth has improved and is not near as hot nor is painful to the touch.  The cellulitis seems to be resolving albeit slowly Past Medical History:  Diagnosis Date  . AAA (abdominal aortic aneurysm) ([place]) 5/15   3.5x3.5 cm    . COPD (chronic obstructive pulmonary disease) ([place])   . Depression   . Diabetes mellitus without complication ([place])   . GERD (gastroesophageal reflux disease)   . Hyperlipidemia   . Hypothyroidism   . Post traumatic stress disorder   . Stroke ([place])   . Thyroid disease   . Ulcer    Past Surgical History:  Procedure Laterality Date  . ABDOMINAL AORTOGRAM W/LOWER EXTREMITY N/A 08/20/2017   Procedure: ABDOMINAL AORTOGRAM W/LOWER EXTREMITY;  Surgeon: Waynetta Sandy, MD;  Location: [facility];  Service: Cardiovascular;  Laterality: N/A;  . CATARACT EXTRACTION    . CHOLECYSTECTOMY    . KNEE SURGERY    . PERIPHERAL VASCULAR ATHERECTOMY Right 08/20/2017   Procedure: PERIPHERAL VASCULAR ATHERECTOMY;  Surgeon: Waynetta Sandy, MD;  Location: [facility];  Service: Cardiovascular;  Laterality: Right;  posterior tibial and tibeoperoneal trunk  . PERIPHERAL VASCULAR BALLOON ANGIOPLASTY Right 08/20/2017   Procedure: PERIPHERAL VASCULAR BALLOON ANGIOPLASTY;  Surgeon: Waynetta Sandy, MD;  Location: [facility];  Service: Cardiovascular;  Laterality: Right;  Anterior tibial   Current Outpatient Medications on File Prior to Visit  Medication Sig Dispense Refill  . albuterol (PROVENTIL HFA;VENTOLIN HFA) 108 (90 Base) MCG/ACT inhaler INHALE 2 PUFFS INTO THE LUNGS EVERY 6 HOURS AS NEEDED FOR WHEEZE OR SHORTNESS OF BREATH 18 g 3  . atorvastatin (LIPITOR) 10 MG tablet Take 5 mg by mouth daily. Takes 5 mg     . buPROPion (WELLBUTRIN SR) 150 MG 12 hr tablet Take 1 tablet (150 mg total) by mouth daily. 30 tablet 1  . busPIRone (BUSPAR) 10 MG tablet Take 0.5 tablets (5 mg total) by mouth 2 (two) times daily as needed (for anxiety). (Patient taking differently: Take 5 mg by mouth 4 (four) times daily. ) 60 tablet 0  . clopidogrel (PLAVIX) 75 MG tablet Take 1 tablet (75 mg total) by mouth daily with breakfast. 90 tablet 3  . insulin glargine (LANTUS) 100 UNIT/ML  injection Inject 40 Units into the skin 2 (two) times daily. s    . Insulin Pen Needle (PEN NEEDLES 5/16") 30G X 8 MM MISC Use with insulin pen daily DX:E11.9 100 each 5  . levothyroxine (SYNTHROID, LEVOTHROID) 125 MCG tablet Take 1 tablet (125 mcg total) by mouth daily before breakfast. 30 tablet 1  . metFORMIN (GLUCOPHAGE) 500 MG tablet Take 500 mg by mouth 2 (two) times daily with a meal.    . morphine (MS CONTIN) 100 MG 12 hr tablet Take 100 mg by mouth every 12 (twelve) hours.    . Multiple Vitamins-Minerals (MULTIVITAMIN WITH MINERALS) tablet Take 1 tablet by mouth daily.    . RABEprazole (ACIPHEX) 20 MG tablet Take 2 tablets (40 mg total) by mouth daily before breakfast. 30 tablet 1  . sulfamethoxazole-trimethoprim (BACTRIM DS,SEPTRA DS) 800-160 MG tablet Take 1 tablet by mouth 2 (two) times  daily. 14 tablet 0  . tamsulosin (FLOMAX) 0.4 MG CAPS capsule Take 0.4 mg by mouth daily.    . traZODone (DESYREL) 100 MG tablet Take 3 tablets (300 mg total) by mouth at bedtime. 30 tablet 0  . venlafaxine XR (EFFEXOR-XR) 150 MG 24 hr capsule Take 1 capsule (150 mg total) by mouth daily with breakfast. (Patient taking differently: 150 mg 2 (two) times daily. ) 30 capsule 1   No current facility-administered medications on file prior to visit.    No Known Allergies Social History   Socioeconomic History  . Marital status: Married    Spouse name: Diane  . Number of children: 1  . Years of education: HS  . Highest education level: Not on file  Social Needs  . Financial resource strain: Not on file  . Food insecurity - worry: Not on file  . Food insecurity - inability: Not on file  . Transportation needs - medical: Not on file  . Transportation needs - non-medical: Not on file  Occupational History  . Occupation: Retired  Tobacco Use  . Smoking status: Current Every Day Smoker    Packs/day: 1.00    Years: 30.00    Pack years: 30.00    Types: Cigarettes  . Smokeless tobacco: Former Systems developer     Quit date: 05/04/2013  Substance and Sexual Activity  . Alcohol use: No    Comment: Former heavy drinker, been sober for 10 years  . Drug use: No  . Sexual activity: Not on file  Other Topics Concern  . Not on file  Social History Narrative   Patient lives at home with spouse.   Caffeine Use: 3-4 cups daily   Retired Dealer.      Review of Systems  Gastrointestinal: Positive for constipation.  All other systems reviewed and are negative.      Objective:   Physical Exam  Constitutional: He appears well-developed and well-nourished. No distress.  HENT:  Right Ear: External ear normal.  Left Ear: External ear normal.  Nose: Nose normal.  Mouth/Throat: Oropharynx is clear and moist. No oropharyngeal exudate.  Eyes: Conjunctivae are normal.  Neck: Neck supple.  Cardiovascular: Normal rate, regular rhythm and normal heart sounds.  No murmur heard. Pulmonary/Chest: No accessory muscle usage. No respiratory distress. He has wheezes.  Musculoskeletal:       Right lower leg: He exhibits tenderness, swelling and deformity.       Legs: Skin: Lesion noted. He is not diaphoretic. There is erythema.  Vitals reviewed.         Assessment & Plan:  Fever, unknown origin  The wound on his right leg is essentially unchanged.  However the cellulitis seems to be improving.  His fever seem to be improving.  His pain is definitely improved.  The discharge and the swelling is much better.  Complete my Bactrim that I prescribed earlier this week.  I would extend the course for an additional 7 days to complete 14 days total.  He was given 10 days of clindamycin at the [facility] yesterday.  I believe it is fine to switch from the Bactrim to the clindamycin as they essentially are covering Staph however the clindamycin may also cover anaerobic bacteria.  Therefore I am perfectly acceptable of the switch.  Follow-up with the wound clinic as discussed.

## 2017-09-05 DIAGNOSIS — S81801D Unspecified open wound, right lower leg, subsequent encounter: Secondary | ICD-10-CM | POA: Diagnosis not present

## 2017-09-05 DIAGNOSIS — M1990 Unspecified osteoarthritis, unspecified site: Secondary | ICD-10-CM | POA: Diagnosis not present

## 2017-09-05 DIAGNOSIS — I872 Venous insufficiency (chronic) (peripheral): Secondary | ICD-10-CM | POA: Diagnosis not present

## 2017-09-05 DIAGNOSIS — I10 Essential (primary) hypertension: Secondary | ICD-10-CM | POA: Diagnosis not present

## 2017-09-05 DIAGNOSIS — E119 Type 2 diabetes mellitus without complications: Secondary | ICD-10-CM | POA: Diagnosis not present

## 2017-09-05 DIAGNOSIS — J449 Chronic obstructive pulmonary disease, unspecified: Secondary | ICD-10-CM | POA: Diagnosis not present

## 2017-09-05 DIAGNOSIS — L97211 Non-pressure chronic ulcer of right calf limited to breakdown of skin: Secondary | ICD-10-CM | POA: Diagnosis not present

## 2017-09-05 DIAGNOSIS — G8929 Other chronic pain: Secondary | ICD-10-CM | POA: Diagnosis not present

## 2017-09-05 DIAGNOSIS — F4310 Post-traumatic stress disorder, unspecified: Secondary | ICD-10-CM | POA: Diagnosis not present

## 2017-09-10 ENCOUNTER — Encounter (HOSPITAL_BASED_OUTPATIENT_CLINIC_OR_DEPARTMENT_OTHER): Payer: Non-veteran care | Attending: Physician Assistant

## 2017-09-10 DIAGNOSIS — F17210 Nicotine dependence, cigarettes, uncomplicated: Secondary | ICD-10-CM | POA: Insufficient documentation

## 2017-09-10 DIAGNOSIS — I69320 Aphasia following cerebral infarction: Secondary | ICD-10-CM | POA: Diagnosis not present

## 2017-09-10 DIAGNOSIS — E11622 Type 2 diabetes mellitus with other skin ulcer: Secondary | ICD-10-CM | POA: Insufficient documentation

## 2017-09-10 DIAGNOSIS — I69359 Hemiplegia and hemiparesis following cerebral infarction affecting unspecified side: Secondary | ICD-10-CM | POA: Insufficient documentation

## 2017-09-10 DIAGNOSIS — L97812 Non-pressure chronic ulcer of other part of right lower leg with fat layer exposed: Secondary | ICD-10-CM | POA: Diagnosis not present

## 2017-09-10 DIAGNOSIS — J449 Chronic obstructive pulmonary disease, unspecified: Secondary | ICD-10-CM | POA: Insufficient documentation

## 2017-09-10 DIAGNOSIS — L97212 Non-pressure chronic ulcer of right calf with fat layer exposed: Secondary | ICD-10-CM | POA: Diagnosis not present

## 2017-09-12 DIAGNOSIS — I872 Venous insufficiency (chronic) (peripheral): Secondary | ICD-10-CM | POA: Diagnosis not present

## 2017-09-12 DIAGNOSIS — S81801D Unspecified open wound, right lower leg, subsequent encounter: Secondary | ICD-10-CM | POA: Diagnosis not present

## 2017-09-12 DIAGNOSIS — I10 Essential (primary) hypertension: Secondary | ICD-10-CM | POA: Diagnosis not present

## 2017-09-12 DIAGNOSIS — G8929 Other chronic pain: Secondary | ICD-10-CM | POA: Diagnosis not present

## 2017-09-12 DIAGNOSIS — M1990 Unspecified osteoarthritis, unspecified site: Secondary | ICD-10-CM | POA: Diagnosis not present

## 2017-09-12 DIAGNOSIS — J449 Chronic obstructive pulmonary disease, unspecified: Secondary | ICD-10-CM | POA: Diagnosis not present

## 2017-09-12 DIAGNOSIS — F4310 Post-traumatic stress disorder, unspecified: Secondary | ICD-10-CM | POA: Diagnosis not present

## 2017-09-12 DIAGNOSIS — E119 Type 2 diabetes mellitus without complications: Secondary | ICD-10-CM | POA: Diagnosis not present

## 2017-09-12 DIAGNOSIS — L97211 Non-pressure chronic ulcer of right calf limited to breakdown of skin: Secondary | ICD-10-CM | POA: Diagnosis not present

## 2017-09-15 DIAGNOSIS — M1990 Unspecified osteoarthritis, unspecified site: Secondary | ICD-10-CM | POA: Diagnosis not present

## 2017-09-15 DIAGNOSIS — S81801D Unspecified open wound, right lower leg, subsequent encounter: Secondary | ICD-10-CM | POA: Diagnosis not present

## 2017-09-15 DIAGNOSIS — F4310 Post-traumatic stress disorder, unspecified: Secondary | ICD-10-CM | POA: Diagnosis not present

## 2017-09-15 DIAGNOSIS — J449 Chronic obstructive pulmonary disease, unspecified: Secondary | ICD-10-CM | POA: Diagnosis not present

## 2017-09-15 DIAGNOSIS — L97211 Non-pressure chronic ulcer of right calf limited to breakdown of skin: Secondary | ICD-10-CM | POA: Diagnosis not present

## 2017-09-15 DIAGNOSIS — I10 Essential (primary) hypertension: Secondary | ICD-10-CM | POA: Diagnosis not present

## 2017-09-15 DIAGNOSIS — I872 Venous insufficiency (chronic) (peripheral): Secondary | ICD-10-CM | POA: Diagnosis not present

## 2017-09-15 DIAGNOSIS — E119 Type 2 diabetes mellitus without complications: Secondary | ICD-10-CM | POA: Diagnosis not present

## 2017-09-15 DIAGNOSIS — G8929 Other chronic pain: Secondary | ICD-10-CM | POA: Diagnosis not present

## 2017-09-17 DIAGNOSIS — J449 Chronic obstructive pulmonary disease, unspecified: Secondary | ICD-10-CM | POA: Diagnosis not present

## 2017-09-17 DIAGNOSIS — I872 Venous insufficiency (chronic) (peripheral): Secondary | ICD-10-CM | POA: Diagnosis not present

## 2017-09-17 DIAGNOSIS — I10 Essential (primary) hypertension: Secondary | ICD-10-CM | POA: Diagnosis not present

## 2017-09-17 DIAGNOSIS — G8929 Other chronic pain: Secondary | ICD-10-CM | POA: Diagnosis not present

## 2017-09-17 DIAGNOSIS — S81801D Unspecified open wound, right lower leg, subsequent encounter: Secondary | ICD-10-CM | POA: Diagnosis not present

## 2017-09-17 DIAGNOSIS — F4310 Post-traumatic stress disorder, unspecified: Secondary | ICD-10-CM | POA: Diagnosis not present

## 2017-09-17 DIAGNOSIS — E11622 Type 2 diabetes mellitus with other skin ulcer: Secondary | ICD-10-CM | POA: Diagnosis not present

## 2017-09-17 DIAGNOSIS — E119 Type 2 diabetes mellitus without complications: Secondary | ICD-10-CM | POA: Diagnosis not present

## 2017-09-17 DIAGNOSIS — L97812 Non-pressure chronic ulcer of other part of right lower leg with fat layer exposed: Secondary | ICD-10-CM | POA: Diagnosis not present

## 2017-09-17 DIAGNOSIS — L97211 Non-pressure chronic ulcer of right calf limited to breakdown of skin: Secondary | ICD-10-CM | POA: Diagnosis not present

## 2017-09-17 DIAGNOSIS — I69320 Aphasia following cerebral infarction: Secondary | ICD-10-CM | POA: Diagnosis not present

## 2017-09-17 DIAGNOSIS — M1990 Unspecified osteoarthritis, unspecified site: Secondary | ICD-10-CM | POA: Diagnosis not present

## 2017-09-17 DIAGNOSIS — F17210 Nicotine dependence, cigarettes, uncomplicated: Secondary | ICD-10-CM | POA: Diagnosis not present

## 2017-09-17 DIAGNOSIS — L97219 Non-pressure chronic ulcer of right calf with unspecified severity: Secondary | ICD-10-CM | POA: Diagnosis not present

## 2017-09-17 DIAGNOSIS — I69359 Hemiplegia and hemiparesis following cerebral infarction affecting unspecified side: Secondary | ICD-10-CM | POA: Diagnosis not present

## 2017-09-19 ENCOUNTER — Encounter: Payer: Self-pay | Admitting: Family Medicine

## 2017-09-19 ENCOUNTER — Ambulatory Visit (INDEPENDENT_AMBULATORY_CARE_PROVIDER_SITE_OTHER): Payer: Medicare HMO | Admitting: Family Medicine

## 2017-09-19 VITALS — BP 134/70 | HR 64 | Temp 98.1°F | Resp 20 | Ht 70.75 in | Wt 251.0 lb

## 2017-09-19 DIAGNOSIS — L97211 Non-pressure chronic ulcer of right calf limited to breakdown of skin: Secondary | ICD-10-CM | POA: Diagnosis not present

## 2017-09-19 DIAGNOSIS — I10 Essential (primary) hypertension: Secondary | ICD-10-CM | POA: Diagnosis not present

## 2017-09-19 DIAGNOSIS — J449 Chronic obstructive pulmonary disease, unspecified: Secondary | ICD-10-CM | POA: Diagnosis not present

## 2017-09-19 DIAGNOSIS — G4719 Other hypersomnia: Secondary | ICD-10-CM

## 2017-09-19 DIAGNOSIS — E291 Testicular hypofunction: Secondary | ICD-10-CM | POA: Diagnosis not present

## 2017-09-19 DIAGNOSIS — G4710 Hypersomnia, unspecified: Secondary | ICD-10-CM

## 2017-09-19 DIAGNOSIS — M1990 Unspecified osteoarthritis, unspecified site: Secondary | ICD-10-CM | POA: Diagnosis not present

## 2017-09-19 DIAGNOSIS — E119 Type 2 diabetes mellitus without complications: Secondary | ICD-10-CM | POA: Diagnosis not present

## 2017-09-19 DIAGNOSIS — S81801D Unspecified open wound, right lower leg, subsequent encounter: Secondary | ICD-10-CM | POA: Diagnosis not present

## 2017-09-19 DIAGNOSIS — I872 Venous insufficiency (chronic) (peripheral): Secondary | ICD-10-CM | POA: Diagnosis not present

## 2017-09-19 DIAGNOSIS — R5382 Chronic fatigue, unspecified: Secondary | ICD-10-CM

## 2017-09-19 DIAGNOSIS — F4310 Post-traumatic stress disorder, unspecified: Secondary | ICD-10-CM | POA: Diagnosis not present

## 2017-09-19 DIAGNOSIS — G8929 Other chronic pain: Secondary | ICD-10-CM | POA: Diagnosis not present

## 2017-09-19 MED ORDER — TESTOSTERONE CYPIONATE 200 MG/ML IM SOLN: 200.0000 mg | INTRAMUSCULAR | 0 refills | Status: DC

## 2017-09-19 NOTE — Progress Notes (Signed)
Subjective:    Patient ID: Brad Taylor, male    DOB: 1951-06-30, 67 y.o.   MRN: [id number]  Constipation     07/2017 Patient presents with 1 week of worsening chest congestion, audible wheezing, increasing shortness of breath, and head congestion.  On examination today, the patient has markedly diminished breath sounds left greater than right.  He has diffuse rhonchorous breath sounds but he has right basilar rales heard posteriorly.  He has diffuse expiratory wheezing.  He has diminished breath sounds bilaterally.  He has not been using albuterol.  He denies any hemoptysis.  He denies any chest pain.  He denies any fevers or chills or pleurisy.  At that time, my plan was: Patient appears to have a COPD exacerbation.  Begin prednisone taper pack for the bronchospasms appreciated on exam in addition to albuterol 2 puffs every 6 hours as needed.  Add Levaquin 500 mg p.o. daily for 7 days particularly given the rales appreciated in the right lower lobe.  Recheck next week or sooner if worse.  09/01/17 Patient presents with 2-3 weeks of hot and cold flashes. It sounds like he may be running fevers. He will start sweating for no reason. He will wake up drenched with sweat. They have not checked his temperature. During that same time, he reports worsening constipation. He is on morphine to the care of the [place]. He is increase the amount of morphine that he is taking due to pain in his right leg. On examination, there is a large wound,/venous stasis ulcer on the lateral aspect of his right shin. It is actually a complex of several small wounds of coalesced and one large. The superior wound is 11 cm x 6 cm in an "I" shaped configuration. The smaller wound is a 6 cm circular ulcer down to the underlying muscle/fascia. There is a foul odor emanating from the wound. There is opaque yellowish-green exudate coming from the wound. The surrounding skin is erythematous and warm and painful. It appears to be  secondarily infected. I believe this may be the source of his hot and cold flashes and suspected fevers. Recently saw vascular surgery who improved the blood flow in his right leg.  He underwent atherectomy and balloon angioplasty of the tibioperoneal trunk.  Previously had a heavily calcified 70% stenosis that was improved to 0% after the procedure earlier this month.  Hopefully this will help with this ulcer and healing but I do believe the patient is at high risk for losing his extremity if the wound will not heal.  At that time, my plan was: I believe the patient has a venous stasis ulcer that has become secondarily infected. It is been a nonhealing ulcer now for more than a year and cared for at the [facility] and I believe the nonhealing aspect is likely due to arterial insufficiency. Begin Bactrim double strength tablets 1 by mouth twice a day and recheck later this week to see if the cellulitis is improving. Consult wound clinic as soon as possible to help facilitate healing of the wound. Obtain lab work to workup other potential causes of fever of unknown origin. Start movantik 25 mg a day for opiate-induced constipation. I believe his sleeping is likely due to the fact he states he more morphine. He apparently was screen for obstructive sleep apnea at the [facility] last year and was found to not have that according to the patient.    09/04/17 Patient is doing much better.  Constipation resolved  on movantik.  His leg feels much better and he is having to take less morphine.  He has an appointment to see the wound clinic of Wednesday of next week.  The erythema in the right lower extremity has faded though still present.  The discharge has decreased dramatically.  The smell is much improved.  The warmth has improved and is not near as hot nor is painful to the touch.  The cellulitis seems to be resolving albeit slowly.  At that time, my plan was: The wound on his right leg is essentially unchanged.  However the  cellulitis seems to be improving.  His fever seem to be improving.  His pain is definitely improved.  The discharge and the swelling is much better.  Complete my Bactrim that I prescribed earlier this week.  I would extend the course for an additional 7 days to complete 14 days total.  He was given 10 days of clindamycin at the [facility] yesterday.  I believe it is fine to switch from the Bactrim to the clindamycin as they essentially are covering Staph however the clindamycin may also cover anaerobic bacteria.  Therefore I am perfectly acceptable of the switch.  Follow-up with the wound clinic as discussed.  09/19/17 Patient is here today with his wife complaining of severe fatigue.  He states that he has no energy.  He denies any chest pain or shortness of breath.  He reports very little strength or stamina or desire to do anything.  He also reports hot flashes.  He is checked his temperature and they are not fevers.  His wound on his leg is improving under the care of a wound clinic.  His lab workup previously did show significant hypogonadism with a total testosterone level of 84.  However he is falling asleep during our office visit.  He dozes off as I talk to him and has to startle himself back awake.  I explained to the patient that this would not be due to hypogonadism and most likely suggests sleep apnea versus narcolepsy.  He states that in the remote past he had a sleep study that diagnosed him with sleep apnea however he is receiving no treatment.  He reports feeling sleepy and tired all the time.  He can easily fall asleep during conversations, after lunch, reading a book, watching TV, riding in a car.  He falls asleep constantly.  His Epworth sleepiness score would therefore be over 18! Past Medical History:  Diagnosis Date  . AAA (abdominal aortic aneurysm) ([place]) 5/15   3.5x3.5 cm  . COPD (chronic obstructive pulmonary disease) ([place])   . Depression   . Diabetes mellitus without complication ([place])     . GERD (gastroesophageal reflux disease)   . Hyperlipidemia   . Hypothyroidism   . Post traumatic stress disorder   . Stroke ([place])   . Thyroid disease   . Ulcer    Past Surgical History:  Procedure Laterality Date  . ABDOMINAL AORTOGRAM W/LOWER EXTREMITY N/A 08/20/2017   Procedure: ABDOMINAL AORTOGRAM W/LOWER EXTREMITY;  Surgeon: Waynetta Sandy, MD;  Location: [facility];  Service: Cardiovascular;  Laterality: N/A;  . CATARACT EXTRACTION    . CHOLECYSTECTOMY    . KNEE SURGERY    . PERIPHERAL VASCULAR ATHERECTOMY Right 08/20/2017   Procedure: PERIPHERAL VASCULAR ATHERECTOMY;  Surgeon: Waynetta Sandy, MD;  Location: [facility];  Service: Cardiovascular;  Laterality: Right;  posterior tibial and tibeoperoneal trunk  . PERIPHERAL VASCULAR BALLOON ANGIOPLASTY  Right 08/20/2017   Procedure: PERIPHERAL VASCULAR BALLOON ANGIOPLASTY;  Surgeon: Waynetta Sandy, MD;  Location: [facility];  Service: Cardiovascular;  Laterality: Right;  Anterior tibial   Current Outpatient Medications on File Prior to Visit  Medication Sig Dispense Refill  . albuterol (PROVENTIL HFA;VENTOLIN HFA) 108 (90 Base) MCG/ACT inhaler INHALE 2 PUFFS INTO THE LUNGS EVERY 6 HOURS AS NEEDED FOR WHEEZE OR SHORTNESS OF BREATH 18 g 3  . atorvastatin (LIPITOR) 10 MG tablet Take 5 mg by mouth daily. Takes 5 mg     . buPROPion (WELLBUTRIN SR) 150 MG 12 hr tablet Take 1 tablet (150 mg total) by mouth daily. 30 tablet 1  . busPIRone (BUSPAR) 10 MG tablet Take 0.5 tablets (5 mg total) by mouth 2 (two) times daily as needed (for anxiety). (Patient taking differently: Take 5 mg by mouth 4 (four) times daily. ) 60 tablet 0  . clopidogrel (PLAVIX) 75 MG tablet Take 1 tablet (75 mg total) by mouth daily with breakfast. 90 tablet 3  . insulin glargine (LANTUS) 100 UNIT/ML injection Inject 40 Units into the skin 2 (two) times daily. s    . Insulin Pen Needle (PEN NEEDLES 5/16") 30G X 8 MM  MISC Use with insulin pen daily DX:E11.9 100 each 5  . levothyroxine (SYNTHROID, LEVOTHROID) 125 MCG tablet Take 1 tablet (125 mcg total) by mouth daily before breakfast. 30 tablet 1  . metFORMIN (GLUCOPHAGE) 500 MG tablet Take 500 mg by mouth 2 (two) times daily with a meal.    . morphine (MS CONTIN) 100 MG 12 hr tablet Take 100 mg by mouth every 12 (twelve) hours.    . Multiple Vitamins-Minerals (MULTIVITAMIN WITH MINERALS) tablet Take 1 tablet by mouth daily.    . RABEprazole (ACIPHEX) 20 MG tablet Take 2 tablets (40 mg total) by mouth daily before breakfast. 30 tablet 1  . sulfamethoxazole-trimethoprim (BACTRIM DS,SEPTRA DS) 800-160 MG tablet Take 1 tablet by mouth 2 (two) times daily. 14 tablet 0  . tamsulosin (FLOMAX) 0.4 MG CAPS capsule Take 0.4 mg by mouth daily.    . traZODone (DESYREL) 100 MG tablet Take 3 tablets (300 mg total) by mouth at bedtime. 30 tablet 0  . venlafaxine XR (EFFEXOR-XR) 150 MG 24 hr capsule Take 1 capsule (150 mg total) by mouth daily with breakfast. (Patient taking differently: 150 mg 2 (two) times daily. ) 30 capsule 1   No current facility-administered medications on file prior to visit.    No Known Allergies Social History   Socioeconomic History  . Marital status: Married    Spouse name: Diane  . Number of children: 1  . Years of education: HS  . Highest education level: Not on file  Social Needs  . Financial resource strain: Not on file  . Food insecurity - worry: Not on file  . Food insecurity - inability: Not on file  . Transportation needs - medical: Not on file  . Transportation needs - non-medical: Not on file  Occupational History  . Occupation: Retired  Tobacco Use  . Smoking status: Current Every Day Smoker    Packs/day: 1.00    Years: 30.00    Pack years: 30.00    Types: Cigarettes  . Smokeless tobacco: Former Systems developer    Quit date: 05/04/2013  Substance and Sexual Activity  . Alcohol use: No    Comment: Former heavy drinker, been  sober for 10 years  . Drug use: No  . Sexual activity: Not on file  Other Topics Concern  . Not on file  Social History Narrative   Patient lives at home with spouse.   Caffeine Use: 3-4 cups daily   Retired Dealer.      Review of Systems  Gastrointestinal: Positive for constipation.  All other systems reviewed and are negative.      Objective:   Physical Exam  Constitutional: He appears well-developed and well-nourished. No distress.  HENT:  Right Ear: External ear normal.  Left Ear: External ear normal.  Nose: Nose normal.  Mouth/Throat: Oropharynx is clear and moist. No oropharyngeal exudate.  Eyes: Conjunctivae are normal.  Neck: Neck supple.  Cardiovascular: Normal rate, regular rhythm and normal heart sounds.  No murmur heard. Pulmonary/Chest: No accessory muscle usage. No respiratory distress. He has wheezes.  Musculoskeletal:       Right lower leg: He exhibits tenderness, swelling and deformity.       Legs: Skin: Lesion noted. He is not diaphoretic. There is erythema.  Vitals reviewed.         Assessment & Plan:  .Hypersomnolence Excessive daytime sleepiness Hot flashes Fatigue Hypogonadism in male - Plan: testosterone cypionate (DEPOTESTOSTERONE CYPIONATE) 200 MG/ML injection  This is multifactorial and due to a combination of hypogonadism as well as I anticipate obstructive sleep apnea.  I recommended a referral to a sleep specialist for a split level sleep study.  I believe the patient would significantly benefit from CPAP therapy as far as treating his hypersomnolence and excessive daytime sleepiness.  I am sure this will also help with his fatigue.  However I believe also receiving testosterone replacement would also help with his fatigue and hot flashes.  Therefore I will start him on testosterone cypionate 200 mg IM every 2 weeks.  The wife will pick up the supplies and bring them back to the clinic so that we can teach her how to administer the  shots.  Recheck all labs in 3 months.  We discussed the increased risk of cardiovascular events on testosterone as well as increased risk of prostate cancer and the patient is willing to accept these risk

## 2017-09-22 DIAGNOSIS — S81801D Unspecified open wound, right lower leg, subsequent encounter: Secondary | ICD-10-CM | POA: Diagnosis not present

## 2017-09-22 DIAGNOSIS — I10 Essential (primary) hypertension: Secondary | ICD-10-CM | POA: Diagnosis not present

## 2017-09-22 DIAGNOSIS — L97211 Non-pressure chronic ulcer of right calf limited to breakdown of skin: Secondary | ICD-10-CM | POA: Diagnosis not present

## 2017-09-22 DIAGNOSIS — G8929 Other chronic pain: Secondary | ICD-10-CM | POA: Diagnosis not present

## 2017-09-22 DIAGNOSIS — J449 Chronic obstructive pulmonary disease, unspecified: Secondary | ICD-10-CM | POA: Diagnosis not present

## 2017-09-22 DIAGNOSIS — E119 Type 2 diabetes mellitus without complications: Secondary | ICD-10-CM | POA: Diagnosis not present

## 2017-09-22 DIAGNOSIS — F4310 Post-traumatic stress disorder, unspecified: Secondary | ICD-10-CM | POA: Diagnosis not present

## 2017-09-22 DIAGNOSIS — M1990 Unspecified osteoarthritis, unspecified site: Secondary | ICD-10-CM | POA: Diagnosis not present

## 2017-09-22 DIAGNOSIS — I872 Venous insufficiency (chronic) (peripheral): Secondary | ICD-10-CM | POA: Diagnosis not present

## 2017-10-01 ENCOUNTER — Encounter (HOSPITAL_COMMUNITY): Payer: Medicare HMO

## 2017-10-01 DIAGNOSIS — I69359 Hemiplegia and hemiparesis following cerebral infarction affecting unspecified side: Secondary | ICD-10-CM | POA: Diagnosis not present

## 2017-10-01 DIAGNOSIS — L97812 Non-pressure chronic ulcer of other part of right lower leg with fat layer exposed: Secondary | ICD-10-CM | POA: Diagnosis not present

## 2017-10-01 DIAGNOSIS — L97819 Non-pressure chronic ulcer of other part of right lower leg with unspecified severity: Secondary | ICD-10-CM | POA: Diagnosis not present

## 2017-10-01 DIAGNOSIS — E11622 Type 2 diabetes mellitus with other skin ulcer: Secondary | ICD-10-CM | POA: Diagnosis not present

## 2017-10-01 DIAGNOSIS — F17210 Nicotine dependence, cigarettes, uncomplicated: Secondary | ICD-10-CM | POA: Diagnosis not present

## 2017-10-01 DIAGNOSIS — I69320 Aphasia following cerebral infarction: Secondary | ICD-10-CM | POA: Diagnosis not present

## 2017-10-01 DIAGNOSIS — J449 Chronic obstructive pulmonary disease, unspecified: Secondary | ICD-10-CM | POA: Diagnosis not present

## 2017-10-01 DIAGNOSIS — L97212 Non-pressure chronic ulcer of right calf with fat layer exposed: Secondary | ICD-10-CM | POA: Diagnosis not present

## 2017-10-02 ENCOUNTER — Ambulatory Visit (HOSPITAL_COMMUNITY): Admission: RE | Admit: 2017-10-02 | Payer: Non-veteran care | Source: Ambulatory Visit

## 2017-10-02 ENCOUNTER — Ambulatory Visit (HOSPITAL_COMMUNITY)
Admission: RE | Admit: 2017-10-02 | Discharge: 2017-10-02 | Disposition: A | Discharge status: Home / Self Care | Payer: Non-veteran care | Source: Ambulatory Visit | Attending: Vascular Surgery | Admitting: Vascular Surgery

## 2017-10-02 DIAGNOSIS — I771 Stricture of artery: Secondary | ICD-10-CM | POA: Insufficient documentation

## 2017-10-02 DIAGNOSIS — Z48812 Encounter for surgical aftercare following surgery on the circulatory system: Secondary | ICD-10-CM | POA: Diagnosis not present

## 2017-10-02 DIAGNOSIS — I998 Other disorder of circulatory system: Secondary | ICD-10-CM

## 2017-10-02 LAB — VAS US LOWER EXTREMITY ARTERIAL DUPLEX
RATIBDISTSYS: 16 cm/s
RPERPSV: 56 cm/s
RTIBDISTSYS: 70 cm/s
Right super femoral dist sys PSV: -85 cm/s
Right super femoral mid sys PSV: -146 cm/s
Right super femoral prox sys PSV: -86 cm/s

## 2017-10-03 ENCOUNTER — Ambulatory Visit: Payer: Medicare HMO | Admitting: Vascular Surgery

## 2017-10-03 ENCOUNTER — Encounter: Payer: Self-pay | Admitting: Vascular Surgery

## 2017-10-03 ENCOUNTER — Ambulatory Visit (INDEPENDENT_AMBULATORY_CARE_PROVIDER_SITE_OTHER): Payer: Medicare HMO | Admitting: Vascular Surgery

## 2017-10-03 ENCOUNTER — Other Ambulatory Visit: Payer: Self-pay

## 2017-10-03 VITALS — BP 114/72 | HR 66 | Temp 98.4°F | Resp 20 | Ht 72.0 in | Wt 245.0 lb

## 2017-10-03 DIAGNOSIS — I872 Venous insufficiency (chronic) (peripheral): Secondary | ICD-10-CM | POA: Diagnosis not present

## 2017-10-03 DIAGNOSIS — I739 Peripheral vascular disease, unspecified: Secondary | ICD-10-CM

## 2017-10-03 NOTE — Progress Notes (Signed)
Patient ID: Brad Taylor, male   DOB: 1950-09-22, 67 y.o.   MRN: [id number]  Reason for Consult: PAD (f/u)   Referred by [name], MD  Subjective:     HPI:  Brad Taylor is a 67 y.o. male with recent balloon angioplasty of the TP trunk PT and AT arteries for mixed venous and arterial wound.  He continues to see the wound care center and its co-band to the right leg.  He does wear compression stockings on the left leg where he has no wounds.  He does have hot flashes as his only complaints but does not appear infected he was recently diagnosed with low testosterone and is now getting injections.  He remains on Plavix.  States that his wound is getting somewhat better since our intervention.  Past Medical History:  Diagnosis Date  . AAA (abdominal aortic aneurysm) ([place]) 5/15   3.5x3.5 cm  . COPD (chronic obstructive pulmonary disease) ([place])   . Depression   . Diabetes mellitus without complication ([place])   . GERD (gastroesophageal reflux disease)   . Hyperlipidemia   . Hypothyroidism   . Post traumatic stress disorder   . Stroke ([place])   . Thyroid disease   . Ulcer    Family History  Problem Relation Age of Onset  . Emphysema Father   . Thyroid disease Sister   . Stroke Maternal Grandmother   . Thyroid disease Sister   . Thyroid disease Sister    Past Surgical History:  Procedure Laterality Date  . ABDOMINAL AORTOGRAM W/LOWER EXTREMITY N/A 08/20/2017   Procedure: ABDOMINAL AORTOGRAM W/LOWER EXTREMITY;  Surgeon: Waynetta Sandy, MD;  Location: [facility];  Service: Cardiovascular;  Laterality: N/A;  . CATARACT EXTRACTION    . CHOLECYSTECTOMY    . KNEE SURGERY    . PERIPHERAL VASCULAR ATHERECTOMY Right 08/20/2017   Procedure: PERIPHERAL VASCULAR ATHERECTOMY;  Surgeon: Waynetta Sandy, MD;  Location: [facility];  Service: Cardiovascular;  Laterality: Right;  posterior tibial and tibeoperoneal trunk  . PERIPHERAL VASCULAR BALLOON  ANGIOPLASTY Right 08/20/2017   Procedure: PERIPHERAL VASCULAR BALLOON ANGIOPLASTY;  Surgeon: Waynetta Sandy, MD;  Location: [facility];  Service: Cardiovascular;  Laterality: Right;  Anterior tibial    Short Social History:  Social History   Tobacco Use  . Smoking status: Current Every Day Smoker    Packs/day: 1.00    Years: 30.00    Pack years: 30.00    Types: Cigarettes  . Smokeless tobacco: Former Systems developer    Quit date: 05/04/2013  Substance Use Topics  . Alcohol use: No    Comment: Former heavy drinker, been sober for 10 years    No Known Allergies  Current Outpatient Medications  Medication Sig Dispense Refill  . albuterol (PROVENTIL HFA;VENTOLIN HFA) 108 (90 Base) MCG/ACT inhaler INHALE 2 PUFFS INTO THE LUNGS EVERY 6 HOURS AS NEEDED FOR WHEEZE OR SHORTNESS OF BREATH 18 g 3  . atorvastatin (LIPITOR) 10 MG tablet Take 5 mg by mouth daily. Takes 5 mg     . buPROPion (WELLBUTRIN SR) 150 MG 12 hr tablet Take 1 tablet (150 mg total) by mouth daily. 30 tablet 1  . busPIRone (BUSPAR) 10 MG tablet Take 0.5 tablets (5 mg total) by mouth 2 (two) times daily as needed (for anxiety). (Patient taking differently: Take 5 mg by mouth 4 (four) times daily. ) 60 tablet 0  . clopidogrel (PLAVIX) 75 MG tablet Take 1 tablet (75  mg total) by mouth daily with breakfast. 90 tablet 3  . insulin glargine (LANTUS) 100 UNIT/ML injection Inject 40 Units into the skin 2 (two) times daily. s    . Insulin Pen Needle (PEN NEEDLES 5/16") 30G X 8 MM MISC Use with insulin pen daily DX:E11.9 100 each 5  . levothyroxine (SYNTHROID, LEVOTHROID) 125 MCG tablet Take 1 tablet (125 mcg total) by mouth daily before breakfast. 30 tablet 1  . metFORMIN (GLUCOPHAGE) 500 MG tablet Take 500 mg by mouth 2 (two) times daily with a meal.    . morphine (MS CONTIN) 100 MG 12 hr tablet Take 100 mg by mouth every 12 (twelve) hours.    . Multiple Vitamins-Minerals (MULTIVITAMIN WITH MINERALS) tablet Take 1 tablet by  mouth daily.    . RABEprazole (ACIPHEX) 20 MG tablet Take 2 tablets (40 mg total) by mouth daily before breakfast. 30 tablet 1  . sulfamethoxazole-trimethoprim (BACTRIM DS,SEPTRA DS) 800-160 MG tablet Take 1 tablet by mouth 2 (two) times daily. 14 tablet 0  . tamsulosin (FLOMAX) 0.4 MG CAPS capsule Take 0.4 mg by mouth daily.    Marland Kitchen testosterone cypionate (DEPOTESTOSTERONE CYPIONATE) 200 MG/ML injection Inject 1 mL (200 mg total) into the muscle every 14 (fourteen) days. 10 mL 0  . traZODone (DESYREL) 100 MG tablet Take 3 tablets (300 mg total) by mouth at bedtime. 30 tablet 0  . venlafaxine XR (EFFEXOR-XR) 150 MG 24 hr capsule Take 1 capsule (150 mg total) by mouth daily with breakfast. (Patient taking differently: 150 mg 2 (two) times daily. ) 30 capsule 1   No current facility-administered medications for this visit.     Review of Systems  Constitutional: Positive for chills and fatigue.  Eyes: Eyes negative.  Respiratory: Respiratory negative.  Cardiovascular: Cardiovascular negative.  GI: Gastrointestinal negative.  Musculoskeletal: Musculoskeletal negative.  Skin: Positive for wound.  Neurological: Neurological negative. Psychiatric: Psychiatric negative.        Objective:  Objective   Vitals:   10/03/17 1023  Resp: 20  Weight: 245 lb (111.1 kg)  Height: 6' (1.829 m)   Body mass index is 33.23 kg/m.   Data:      Assessment/Plan:     67 year old male with mixed venous and arterial ulceration of his right lower extremity.  He has been at the wound care center for many months and gets co-band compression dressings to the right lower extremity and does wear compression stocking on the left leg.  He now has improved arterial vasculature although ABIs could not be checked given the wound he does have runoff via the posterior tibial arteries bilaterally and line.  We had previously checked venous reflux studies on the right did not demonstrate any large veins but we are going  to recheck these given that I do think he clearly has evidence of venous reflux.  He will continue with the wound center and will get him set up for venous reflux studies for his C4 venous disease in the near future.     Waynetta Sandy MD [facility]

## 2017-10-06 ENCOUNTER — Other Ambulatory Visit: Payer: Self-pay

## 2017-10-06 DIAGNOSIS — S81801D Unspecified open wound, right lower leg, subsequent encounter: Secondary | ICD-10-CM | POA: Diagnosis not present

## 2017-10-06 DIAGNOSIS — J449 Chronic obstructive pulmonary disease, unspecified: Secondary | ICD-10-CM | POA: Diagnosis not present

## 2017-10-06 DIAGNOSIS — F4310 Post-traumatic stress disorder, unspecified: Secondary | ICD-10-CM | POA: Diagnosis not present

## 2017-10-06 DIAGNOSIS — M1990 Unspecified osteoarthritis, unspecified site: Secondary | ICD-10-CM | POA: Diagnosis not present

## 2017-10-06 DIAGNOSIS — I872 Venous insufficiency (chronic) (peripheral): Secondary | ICD-10-CM

## 2017-10-06 DIAGNOSIS — I10 Essential (primary) hypertension: Secondary | ICD-10-CM | POA: Diagnosis not present

## 2017-10-06 DIAGNOSIS — E119 Type 2 diabetes mellitus without complications: Secondary | ICD-10-CM | POA: Diagnosis not present

## 2017-10-06 DIAGNOSIS — G8929 Other chronic pain: Secondary | ICD-10-CM | POA: Diagnosis not present

## 2017-10-06 DIAGNOSIS — L97211 Non-pressure chronic ulcer of right calf limited to breakdown of skin: Secondary | ICD-10-CM | POA: Diagnosis not present

## 2017-10-08 DIAGNOSIS — J449 Chronic obstructive pulmonary disease, unspecified: Secondary | ICD-10-CM | POA: Diagnosis not present

## 2017-10-08 DIAGNOSIS — S81801D Unspecified open wound, right lower leg, subsequent encounter: Secondary | ICD-10-CM | POA: Diagnosis not present

## 2017-10-08 DIAGNOSIS — I10 Essential (primary) hypertension: Secondary | ICD-10-CM | POA: Diagnosis not present

## 2017-10-08 DIAGNOSIS — E119 Type 2 diabetes mellitus without complications: Secondary | ICD-10-CM | POA: Diagnosis not present

## 2017-10-08 DIAGNOSIS — L97211 Non-pressure chronic ulcer of right calf limited to breakdown of skin: Secondary | ICD-10-CM | POA: Diagnosis not present

## 2017-10-08 DIAGNOSIS — G8929 Other chronic pain: Secondary | ICD-10-CM | POA: Diagnosis not present

## 2017-10-08 DIAGNOSIS — I872 Venous insufficiency (chronic) (peripheral): Secondary | ICD-10-CM | POA: Diagnosis not present

## 2017-10-08 DIAGNOSIS — F4310 Post-traumatic stress disorder, unspecified: Secondary | ICD-10-CM | POA: Diagnosis not present

## 2017-10-08 DIAGNOSIS — M1990 Unspecified osteoarthritis, unspecified site: Secondary | ICD-10-CM | POA: Diagnosis not present

## 2017-10-10 DIAGNOSIS — I10 Essential (primary) hypertension: Secondary | ICD-10-CM | POA: Diagnosis not present

## 2017-10-10 DIAGNOSIS — G8929 Other chronic pain: Secondary | ICD-10-CM | POA: Diagnosis not present

## 2017-10-10 DIAGNOSIS — S81801D Unspecified open wound, right lower leg, subsequent encounter: Secondary | ICD-10-CM | POA: Diagnosis not present

## 2017-10-10 DIAGNOSIS — F4310 Post-traumatic stress disorder, unspecified: Secondary | ICD-10-CM | POA: Diagnosis not present

## 2017-10-10 DIAGNOSIS — L97211 Non-pressure chronic ulcer of right calf limited to breakdown of skin: Secondary | ICD-10-CM | POA: Diagnosis not present

## 2017-10-10 DIAGNOSIS — E119 Type 2 diabetes mellitus without complications: Secondary | ICD-10-CM | POA: Diagnosis not present

## 2017-10-10 DIAGNOSIS — J449 Chronic obstructive pulmonary disease, unspecified: Secondary | ICD-10-CM | POA: Diagnosis not present

## 2017-10-10 DIAGNOSIS — M1990 Unspecified osteoarthritis, unspecified site: Secondary | ICD-10-CM | POA: Diagnosis not present

## 2017-10-10 DIAGNOSIS — I872 Venous insufficiency (chronic) (peripheral): Secondary | ICD-10-CM | POA: Diagnosis not present

## 2017-10-13 DIAGNOSIS — G8929 Other chronic pain: Secondary | ICD-10-CM | POA: Diagnosis not present

## 2017-10-13 DIAGNOSIS — J449 Chronic obstructive pulmonary disease, unspecified: Secondary | ICD-10-CM | POA: Diagnosis not present

## 2017-10-13 DIAGNOSIS — S81801D Unspecified open wound, right lower leg, subsequent encounter: Secondary | ICD-10-CM | POA: Diagnosis not present

## 2017-10-13 DIAGNOSIS — I872 Venous insufficiency (chronic) (peripheral): Secondary | ICD-10-CM | POA: Diagnosis not present

## 2017-10-13 DIAGNOSIS — L97211 Non-pressure chronic ulcer of right calf limited to breakdown of skin: Secondary | ICD-10-CM | POA: Diagnosis not present

## 2017-10-13 DIAGNOSIS — M1990 Unspecified osteoarthritis, unspecified site: Secondary | ICD-10-CM | POA: Diagnosis not present

## 2017-10-13 DIAGNOSIS — I10 Essential (primary) hypertension: Secondary | ICD-10-CM | POA: Diagnosis not present

## 2017-10-13 DIAGNOSIS — E119 Type 2 diabetes mellitus without complications: Secondary | ICD-10-CM | POA: Diagnosis not present

## 2017-10-13 DIAGNOSIS — F4310 Post-traumatic stress disorder, unspecified: Secondary | ICD-10-CM | POA: Diagnosis not present

## 2017-10-15 ENCOUNTER — Encounter (HOSPITAL_BASED_OUTPATIENT_CLINIC_OR_DEPARTMENT_OTHER): Payer: No Typology Code available for payment source | Attending: Physician Assistant

## 2017-10-15 DIAGNOSIS — I69959 Hemiplegia and hemiparesis following unspecified cerebrovascular disease affecting unspecified side: Secondary | ICD-10-CM | POA: Diagnosis not present

## 2017-10-15 DIAGNOSIS — J449 Chronic obstructive pulmonary disease, unspecified: Secondary | ICD-10-CM | POA: Insufficient documentation

## 2017-10-15 DIAGNOSIS — E11622 Type 2 diabetes mellitus with other skin ulcer: Secondary | ICD-10-CM | POA: Diagnosis not present

## 2017-10-15 DIAGNOSIS — L97812 Non-pressure chronic ulcer of other part of right lower leg with fat layer exposed: Secondary | ICD-10-CM | POA: Insufficient documentation

## 2017-10-15 DIAGNOSIS — F17210 Nicotine dependence, cigarettes, uncomplicated: Secondary | ICD-10-CM | POA: Diagnosis not present

## 2017-10-15 DIAGNOSIS — L97212 Non-pressure chronic ulcer of right calf with fat layer exposed: Secondary | ICD-10-CM | POA: Diagnosis not present

## 2017-10-21 ENCOUNTER — Institutional Professional Consult (permissible substitution): Payer: Medicare HMO | Admitting: Neurology

## 2017-10-23 ENCOUNTER — Ambulatory Visit (INDEPENDENT_AMBULATORY_CARE_PROVIDER_SITE_OTHER): Payer: Medicare HMO | Admitting: Family Medicine

## 2017-10-23 ENCOUNTER — Encounter: Payer: Self-pay | Admitting: Family Medicine

## 2017-10-23 ENCOUNTER — Ambulatory Visit (HOSPITAL_COMMUNITY)
Admission: RE | Admit: 2017-10-23 | Discharge: 2017-10-23 | Disposition: A | Discharge status: Home / Self Care | Payer: Non-veteran care | Source: Ambulatory Visit | Attending: Family Medicine | Admitting: Family Medicine

## 2017-10-23 VITALS — BP 140/82 | HR 60 | Temp 98.3°F | Resp 18 | Ht 70.75 in | Wt 250.0 lb

## 2017-10-23 DIAGNOSIS — R059 Cough, unspecified: Secondary | ICD-10-CM

## 2017-10-23 DIAGNOSIS — Z1321 Encounter for screening for nutritional disorder: Secondary | ICD-10-CM | POA: Diagnosis not present

## 2017-10-23 DIAGNOSIS — R05 Cough: Secondary | ICD-10-CM

## 2017-10-23 DIAGNOSIS — G4719 Other hypersomnia: Secondary | ICD-10-CM

## 2017-10-23 DIAGNOSIS — R232 Flushing: Secondary | ICD-10-CM

## 2017-10-23 DIAGNOSIS — R0602 Shortness of breath: Secondary | ICD-10-CM | POA: Diagnosis not present

## 2017-10-23 DIAGNOSIS — E291 Testicular hypofunction: Secondary | ICD-10-CM

## 2017-10-23 DIAGNOSIS — R5382 Chronic fatigue, unspecified: Secondary | ICD-10-CM | POA: Diagnosis not present

## 2017-10-23 DIAGNOSIS — Z1289 Encounter for screening for malignant neoplasm of other sites: Secondary | ICD-10-CM | POA: Diagnosis not present

## 2017-10-23 NOTE — Progress Notes (Addendum)
Subjective:    Patient ID: Brad Taylor, male    DOB: 1951-06-30, 67 y.o.   MRN: [id number]  Constipation     07/2017 Patient presents with 1 week of worsening chest congestion, audible wheezing, increasing shortness of breath, and head congestion.  On examination today, the patient has markedly diminished breath sounds left greater than right.  He has diffuse rhonchorous breath sounds but he has right basilar rales heard posteriorly.  He has diffuse expiratory wheezing.  He has diminished breath sounds bilaterally.  He has not been using albuterol.  He denies any hemoptysis.  He denies any chest pain.  He denies any fevers or chills or pleurisy.  At that time, my plan was: Patient appears to have a COPD exacerbation.  Begin prednisone taper pack for the bronchospasms appreciated on exam in addition to albuterol 2 puffs every 6 hours as needed.  Add Levaquin 500 mg p.o. daily for 7 days particularly given the rales appreciated in the right lower lobe.  Recheck next week or sooner if worse.  09/01/17 Patient presents with 2-3 weeks of hot and cold flashes. It sounds like he may be running fevers. He will start sweating for no reason. He will wake up drenched with sweat. They have not checked his temperature. During that same time, he reports worsening constipation. He is on morphine to the care of the [place]. He is increase the amount of morphine that he is taking due to pain in his right leg. On examination, there is a large wound,/venous stasis ulcer on the lateral aspect of his right shin. It is actually a complex of several small wounds of coalesced and one large. The superior wound is 11 cm x 6 cm in an "I" shaped configuration. The smaller wound is a 6 cm circular ulcer down to the underlying muscle/fascia. There is a foul odor emanating from the wound. There is opaque yellowish-green exudate coming from the wound. The surrounding skin is erythematous and warm and painful. It appears to be  secondarily infected. I believe this may be the source of his hot and cold flashes and suspected fevers. Recently saw vascular surgery who improved the blood flow in his right leg.  He underwent atherectomy and balloon angioplasty of the tibioperoneal trunk.  Previously had a heavily calcified 70% stenosis that was improved to 0% after the procedure earlier this month.  Hopefully this will help with this ulcer and healing but I do believe the patient is at high risk for losing his extremity if the wound will not heal.  At that time, my plan was: I believe the patient has a venous stasis ulcer that has become secondarily infected. It is been a nonhealing ulcer now for more than a year and cared for at the [place] and I believe the nonhealing aspect is likely due to arterial insufficiency. Begin Bactrim double strength tablets 1 by mouth twice a day and recheck later this week to see if the cellulitis is improving. Consult wound clinic as soon as possible to help facilitate healing of the wound. Obtain lab work to workup other potential causes of fever of unknown origin. Start movantik 25 mg a day for opiate-induced constipation. I believe his sleeping is likely due to the fact he states he more morphine. He apparently was screen for obstructive sleep apnea at the [place] last year and was found to not have that according to the patient.    09/04/17 Patient is doing much better.  Constipation resolved  on movantik.  His leg feels much better and he is having to take less morphine.  He has an appointment to see the wound clinic of Wednesday of next week.  The erythema in the right lower extremity has faded though still present.  The discharge has decreased dramatically.  The smell is much improved.  The warmth has improved and is not near as hot nor is painful to the touch.  The cellulitis seems to be resolving albeit slowly.  At that time, my plan was: The wound on his right leg is essentially unchanged.  However the  cellulitis seems to be improving.  His fever seem to be improving.  His pain is definitely improved.  The discharge and the swelling is much better.  Complete my Bactrim that I prescribed earlier this week.  I would extend the course for an additional 7 days to complete 14 days total.  He was given 10 days of clindamycin at the [place] yesterday.  I believe it is fine to switch from the Bactrim to the clindamycin as they essentially are covering Staph however the clindamycin may also cover anaerobic bacteria.  Therefore I am perfectly acceptable of the switch.  Follow-up with the wound clinic as discussed.  09/19/17 Patient is here today with his wife complaining of severe fatigue.  He states that he has no energy.  He denies any chest pain or shortness of breath.  He reports very little strength or stamina or desire to do anything.  He also reports hot flashes.  He is checked his temperature and they are not fevers.  His wound on his leg is improving under the care of a wound clinic.  His lab workup previously did show significant hypogonadism with a total testosterone level of 84.  However he is falling asleep during our office visit.  He dozes off as I talk to him and has to startle himself back awake.  I explained to the patient that this would not be due to hypogonadism and most likely suggests sleep apnea versus narcolepsy.  He states that in the remote past he had a sleep study that diagnosed him with sleep apnea however he is receiving no treatment.  He reports feeling sleepy and tired all the time.  He can easily fall asleep during conversations, after lunch, reading a book, watching TV, riding in a car.  He falls asleep constantly.  His Epworth sleepiness score would therefore be over 18!  At that time, my plan was: This is multifactorial and due to a combination of hypogonadism as well as I anticipate obstructive sleep apnea.  I recommended a referral to a sleep specialist for a split level sleep study.  I  believe the patient would significantly benefit from CPAP therapy as far as treating his hypersomnolence and excessive daytime sleepiness.  I am sure this will also help with his fatigue.  However I believe also receiving testosterone replacement would also help with his fatigue and hot flashes.  Therefore I will start him on testosterone cypionate 200 mg IM every 2 weeks.  The wife will pick up the supplies and bring them back to the clinic so that we can teach her how to administer the shots.  Recheck all labs in 3 months.  We discussed the increased risk of cardiovascular events on testosterone as well as increased risk of prostate cancer and the patient is willing to accept these risk  10/23/17 Since I last saw him, he has refused the sleep study.  He states that he is claustrophobic and he refuses to wear the mask and therefore he does not want to be evaluated.  He continues to have daily hot flashes that are insufferable.  He does not even want to leave the house due to the hot and cold flashes he continues to feel.  He is received 2 doses of testosterone over the last 4 weeks but has seen no improvement in the hot flashes over the fatigue.  He is afebrile.  He does have a cough and chest congestion however he has no other symptoms of any infection.  He denies any sore throat.  He denies any otalgia or sinus pain.  He denies any nausea vomiting or diarrhea.  He denies any dysuria.  On exam today, he does have left posterior basilar wheezes and crackles which are asymmetric and abnormal.  Otherwise his exam is unremarkable.  His wife is concerned because we have not checked a vitamin B12 level.  Previous lab work showed significant hypogonadism and a normal TSH. Past Medical History:  Diagnosis Date  . AAA (abdominal aortic aneurysm) ([place]) 5/15   3.5x3.5 cm  . COPD (chronic obstructive pulmonary disease) ([place])   . Depression   . Diabetes mellitus without complication ([place])   . GERD (gastroesophageal  reflux disease)   . Hyperlipidemia   . Hypothyroidism   . Post traumatic stress disorder   . Stroke ([place])   . Thyroid disease   . Ulcer    Past Surgical History:  Procedure Laterality Date  . ABDOMINAL AORTOGRAM W/LOWER EXTREMITY N/A 08/20/2017   Procedure: ABDOMINAL AORTOGRAM W/LOWER EXTREMITY;  Surgeon: Waynetta Sandy, MD;  Location: [facility];  Service: Cardiovascular;  Laterality: N/A;  . CATARACT EXTRACTION    . CHOLECYSTECTOMY    . KNEE SURGERY    . PERIPHERAL VASCULAR ATHERECTOMY Right 08/20/2017   Procedure: PERIPHERAL VASCULAR ATHERECTOMY;  Surgeon: Waynetta Sandy, MD;  Location: [facility];  Service: Cardiovascular;  Laterality: Right;  posterior tibial and tibeoperoneal trunk  . PERIPHERAL VASCULAR BALLOON ANGIOPLASTY Right 08/20/2017   Procedure: PERIPHERAL VASCULAR BALLOON ANGIOPLASTY;  Surgeon: Waynetta Sandy, MD;  Location: [facility];  Service: Cardiovascular;  Laterality: Right;  Anterior tibial   Current Outpatient Medications on File Prior to Visit  Medication Sig Dispense Refill  . albuterol (PROVENTIL HFA;VENTOLIN HFA) 108 (90 Base) MCG/ACT inhaler INHALE 2 PUFFS INTO THE LUNGS EVERY 6 HOURS AS NEEDED FOR WHEEZE OR SHORTNESS OF BREATH 18 g 3  . atorvastatin (LIPITOR) 10 MG tablet Take 5 mg by mouth daily. Takes 5 mg     . busPIRone (BUSPAR) 10 MG tablet Take 0.5 tablets (5 mg total) by mouth 2 (two) times daily as needed (for anxiety). (Patient taking differently: Take 5 mg by mouth 4 (four) times daily. ) 60 tablet 0  . clopidogrel (PLAVIX) 75 MG tablet Take 1 tablet (75 mg total) by mouth daily with breakfast. 90 tablet 3  . insulin glargine (LANTUS) 100 UNIT/ML injection Inject 40 Units into the skin 2 (two) times daily. s    . Insulin Pen Needle (PEN NEEDLES 5/16") 30G X 8 MM MISC Use with insulin pen daily DX:E11.9 100 each 5  . levothyroxine (SYNTHROID, LEVOTHROID) 125 MCG tablet Take 1 tablet (125 mcg  total) by mouth daily before breakfast. 30 tablet 1  . metFORMIN (GLUCOPHAGE) 500 MG tablet Take 500 mg by mouth 2 (two) times daily with a meal.    . morphine (  MS CONTIN) 100 MG 12 hr tablet Take 100 mg by mouth every 12 (twelve) hours.    . Multiple Vitamins-Minerals (MULTIVITAMIN WITH MINERALS) tablet Take 1 tablet by mouth daily.    . RABEprazole (ACIPHEX) 20 MG tablet Take 2 tablets (40 mg total) by mouth daily before breakfast. 30 tablet 1  . tamsulosin (FLOMAX) 0.4 MG CAPS capsule Take 0.4 mg by mouth daily.    Marland Kitchen testosterone cypionate (DEPOTESTOSTERONE CYPIONATE) 200 MG/ML injection Inject 1 mL (200 mg total) into the muscle every 14 (fourteen) days. 10 mL 0  . traZODone (DESYREL) 100 MG tablet Take 3 tablets (300 mg total) by mouth at bedtime. 30 tablet 0  . venlafaxine XR (EFFEXOR-XR) 150 MG 24 hr capsule Take 1 capsule (150 mg total) by mouth daily with breakfast. (Patient taking differently: 150 mg 2 (two) times daily. ) 30 capsule 1   No current facility-administered medications on file prior to visit.    No Known Allergies Social History   Socioeconomic History  . Marital status: Married    Spouse name: Diane  . Number of children: 1  . Years of education: HS  . Highest education level: Not on file  Occupational History  . Occupation: Retired  Scientific laboratory technician  . Financial resource strain: Not on file  . Food insecurity:    Worry: Not on file    Inability: Not on file  . Transportation needs:    Medical: Not on file    Non-medical: Not on file  Tobacco Use  . Smoking status: Current Every Day Smoker    Packs/day: 1.00    Years: 30.00    Pack years: 30.00    Types: Cigarettes  . Smokeless tobacco: Former Systems developer    Quit date: 05/04/2013  Substance and Sexual Activity  . Alcohol use: No    Comment: Former heavy drinker, been sober for 10 years  . Drug use: No  . Sexual activity: Not on file  Lifestyle  . Physical activity:    Days per week: Not on file    Minutes  per session: Not on file  . Stress: Not on file  Relationships  . Social connections:    Talks on phone: Not on file    Gets together: Not on file    Attends religious service: Not on file    Active member of club or organization: Not on file    Attends meetings of clubs or organizations: Not on file    Relationship status: Not on file  . Intimate partner violence:    Fear of current or ex partner: Not on file    Emotionally abused: Not on file    Physically abused: Not on file    Forced sexual activity: Not on file  Other Topics Concern  . Not on file  Social History Narrative   Patient lives at home with spouse.   Caffeine Use: 3-4 cups daily   Retired Dealer.      Review of Systems  Gastrointestinal: Positive for constipation.  All other systems reviewed and are negative.      Objective:   Physical Exam  Constitutional: He appears well-developed and well-nourished. No distress.  HENT:  Right Ear: External ear normal.  Left Ear: External ear normal.  Nose: Nose normal.  Mouth/Throat: Oropharynx is clear and moist. No oropharyngeal exudate.  Eyes: Conjunctivae are normal.  Neck: Neck supple.  Cardiovascular: Normal rate, regular rhythm and normal heart sounds.  No murmur heard. Pulmonary/Chest: No accessory muscle  usage. No respiratory distress. He has wheezes. He has rales.  Musculoskeletal:       Right lower leg: He exhibits tenderness, swelling and deformity.       Legs: Skin: Lesion noted. He is not diaphoretic. There is erythema.  Vitals reviewed.         Assessment & Plan:   Cough - Plan: DG Chest 2 View  Chronic fatigue - Plan: Vitamin B12, CBC with Differential/Platelet, Protein electrophoresis, serum  Hypogonadism in male  Excessive daytime sleepiness  Hot flashes  Hot flashes could possibly be due to testosterone deficiency however I am concerned about underlying malignancy.  Given his cough, his abnormal pulmonary exam, I will proceed  with a chest x-ray.  Given his chronic fatigue I will check a vitamin B12 along with a CBC.  Given his hot flashes which potentially could be fevers and chronic fatigue I will perform a serum protein electrophoresis to evaluate for multiple myeloma.  I believe his fatigue is still likely multifactorial and related to smoking, hypogonadism, and untreated obstructive sleep apnea.  Addendum (10/29/17) Lab work was unremarkable.  Chest x-ray showed a possible right hilar mass.  Patient has a past medical history of partial right lung resection due to cancer performed at the [facility] [place] 2 years ago.  He has no pulmonologist there who follows with him.  CT scan was performed which suggest scar tissue in the right lung was the cause of the opacity seen on the chest x-ray.  However there was a polypoid mass in the trachea of uncertain significance.  Possible mucus however cancer cannot be ruled out.  Given the patient's night sweats and fatigue, I have recommended pulmonary consult for possible bronchoscopy to evaluate the lesion seen in his distal trachea I discussed these findings with his wife and she is in agreement.  Consult pulmonary.

## 2017-10-24 ENCOUNTER — Encounter (HOSPITAL_COMMUNITY): Payer: Medicare HMO

## 2017-10-24 ENCOUNTER — Ambulatory Visit: Payer: Medicare HMO | Admitting: Vascular Surgery

## 2017-10-24 ENCOUNTER — Other Ambulatory Visit: Payer: Self-pay | Admitting: Family Medicine

## 2017-10-24 DIAGNOSIS — E119 Type 2 diabetes mellitus without complications: Secondary | ICD-10-CM | POA: Diagnosis not present

## 2017-10-24 DIAGNOSIS — S81801D Unspecified open wound, right lower leg, subsequent encounter: Secondary | ICD-10-CM | POA: Diagnosis not present

## 2017-10-24 DIAGNOSIS — L97211 Non-pressure chronic ulcer of right calf limited to breakdown of skin: Secondary | ICD-10-CM | POA: Diagnosis not present

## 2017-10-24 DIAGNOSIS — G8929 Other chronic pain: Secondary | ICD-10-CM | POA: Diagnosis not present

## 2017-10-24 DIAGNOSIS — I10 Essential (primary) hypertension: Secondary | ICD-10-CM | POA: Diagnosis not present

## 2017-10-24 DIAGNOSIS — I872 Venous insufficiency (chronic) (peripheral): Secondary | ICD-10-CM | POA: Diagnosis not present

## 2017-10-24 DIAGNOSIS — M1990 Unspecified osteoarthritis, unspecified site: Secondary | ICD-10-CM | POA: Diagnosis not present

## 2017-10-24 DIAGNOSIS — R911 Solitary pulmonary nodule: Secondary | ICD-10-CM

## 2017-10-24 DIAGNOSIS — R918 Other nonspecific abnormal finding of lung field: Secondary | ICD-10-CM

## 2017-10-24 DIAGNOSIS — J449 Chronic obstructive pulmonary disease, unspecified: Secondary | ICD-10-CM | POA: Diagnosis not present

## 2017-10-24 DIAGNOSIS — F4310 Post-traumatic stress disorder, unspecified: Secondary | ICD-10-CM | POA: Diagnosis not present

## 2017-10-27 DIAGNOSIS — I872 Venous insufficiency (chronic) (peripheral): Secondary | ICD-10-CM | POA: Diagnosis not present

## 2017-10-27 DIAGNOSIS — G8929 Other chronic pain: Secondary | ICD-10-CM | POA: Diagnosis not present

## 2017-10-27 DIAGNOSIS — M1990 Unspecified osteoarthritis, unspecified site: Secondary | ICD-10-CM | POA: Diagnosis not present

## 2017-10-27 DIAGNOSIS — E119 Type 2 diabetes mellitus without complications: Secondary | ICD-10-CM | POA: Diagnosis not present

## 2017-10-27 DIAGNOSIS — L97211 Non-pressure chronic ulcer of right calf limited to breakdown of skin: Secondary | ICD-10-CM | POA: Diagnosis not present

## 2017-10-27 DIAGNOSIS — S81801D Unspecified open wound, right lower leg, subsequent encounter: Secondary | ICD-10-CM | POA: Diagnosis not present

## 2017-10-27 DIAGNOSIS — F4310 Post-traumatic stress disorder, unspecified: Secondary | ICD-10-CM | POA: Diagnosis not present

## 2017-10-27 DIAGNOSIS — J449 Chronic obstructive pulmonary disease, unspecified: Secondary | ICD-10-CM | POA: Diagnosis not present

## 2017-10-27 DIAGNOSIS — I10 Essential (primary) hypertension: Secondary | ICD-10-CM | POA: Diagnosis not present

## 2017-10-27 LAB — CBC WITH DIFFERENTIAL/PLATELET
BASOS PCT: 0.6 %
Basophils Absolute: 54 cells/uL (ref 0–200)
Eosinophils Absolute: 306 cells/uL (ref 15–500)
Eosinophils Relative: 3.4 %
HCT: 45.5 % (ref 38.5–50.0)
Hemoglobin: 15.2 g/dL (ref 13.2–17.1)
Lymphs Abs: 2313 cells/uL (ref 850–3900)
MCH: 30 pg (ref 27.0–33.0)
MCHC: 33.4 g/dL (ref 32.0–36.0)
MCV: 89.9 fL (ref 80.0–100.0)
MONOS PCT: 10.1 %
MPV: 11 fL (ref 7.5–12.5)
Neutro Abs: 5418 cells/uL (ref 1500–7800)
Neutrophils Relative %: 60.2 %
PLATELETS: 264 10*3/uL (ref 140–400)
RBC: 5.06 10*6/uL (ref 4.20–5.80)
RDW: 13 % (ref 11.0–15.0)
TOTAL LYMPHOCYTE: 25.7 %
WBC: 9 10*3/uL (ref 3.8–10.8)
WBCMIX: 909 {cells}/uL (ref 200–950)

## 2017-10-27 LAB — VITAMIN B12: Vitamin B-12: 680 pg/mL (ref 200–1100)

## 2017-10-27 LAB — PROTEIN ELECTROPHORESIS, SERUM
Albumin ELP: 3.6 g/dL — ABNORMAL LOW (ref 3.8–4.8)
Alpha 1: 0.3 g/dL (ref 0.2–0.3)
Alpha 2: 0.8 g/dL (ref 0.5–0.9)
BETA 2: 0.4 g/dL (ref 0.2–0.5)
Beta Globulin: 0.5 g/dL (ref 0.4–0.6)
GAMMA GLOBULIN: 0.9 g/dL (ref 0.8–1.7)
Total Protein: 6.4 g/dL (ref 6.1–8.1)

## 2017-10-28 ENCOUNTER — Ambulatory Visit (HOSPITAL_COMMUNITY)
Admission: RE | Admit: 2017-10-28 | Discharge: 2017-10-28 | Disposition: A | Discharge status: Home / Self Care | Payer: Non-veteran care | Source: Ambulatory Visit | Attending: Family Medicine | Admitting: Family Medicine

## 2017-10-28 DIAGNOSIS — I700 Atherosclerosis of aorta: Secondary | ICD-10-CM | POA: Diagnosis not present

## 2017-10-28 DIAGNOSIS — I2510 Atherosclerotic heart disease of native coronary artery without angina pectoris: Secondary | ICD-10-CM | POA: Diagnosis not present

## 2017-10-28 DIAGNOSIS — I517 Cardiomegaly: Secondary | ICD-10-CM | POA: Insufficient documentation

## 2017-10-28 DIAGNOSIS — I313 Pericardial effusion (noninflammatory): Secondary | ICD-10-CM | POA: Insufficient documentation

## 2017-10-28 DIAGNOSIS — Z902 Acquired absence of lung [part of]: Secondary | ICD-10-CM | POA: Diagnosis not present

## 2017-10-28 DIAGNOSIS — R918 Other nonspecific abnormal finding of lung field: Secondary | ICD-10-CM | POA: Insufficient documentation

## 2017-10-28 DIAGNOSIS — R0602 Shortness of breath: Secondary | ICD-10-CM | POA: Diagnosis not present

## 2017-10-28 DIAGNOSIS — R911 Solitary pulmonary nodule: Secondary | ICD-10-CM | POA: Diagnosis not present

## 2017-10-28 LAB — POCT I-STAT CREATININE: Creatinine, Ser: 0.8 mg/dL (ref 0.61–1.24)

## 2017-10-28 MED ORDER — IOPAMIDOL (ISOVUE-300) INJECTION 61%
75.0000 mL | Freq: Once | INTRAVENOUS | Status: AC | PRN
  Administered 2017-10-28: 75 mL via INTRAVENOUS

## 2017-10-29 ENCOUNTER — Other Ambulatory Visit: Payer: Self-pay | Admitting: Family Medicine

## 2017-10-29 ENCOUNTER — Encounter: Payer: Self-pay | Admitting: Family Medicine

## 2017-10-29 DIAGNOSIS — I872 Venous insufficiency (chronic) (peripheral): Secondary | ICD-10-CM | POA: Diagnosis not present

## 2017-10-29 DIAGNOSIS — I10 Essential (primary) hypertension: Secondary | ICD-10-CM | POA: Diagnosis not present

## 2017-10-29 DIAGNOSIS — E119 Type 2 diabetes mellitus without complications: Secondary | ICD-10-CM | POA: Diagnosis not present

## 2017-10-29 DIAGNOSIS — M1990 Unspecified osteoarthritis, unspecified site: Secondary | ICD-10-CM | POA: Diagnosis not present

## 2017-10-29 DIAGNOSIS — L97211 Non-pressure chronic ulcer of right calf limited to breakdown of skin: Secondary | ICD-10-CM | POA: Diagnosis not present

## 2017-10-29 DIAGNOSIS — F4310 Post-traumatic stress disorder, unspecified: Secondary | ICD-10-CM | POA: Diagnosis not present

## 2017-10-29 DIAGNOSIS — G8929 Other chronic pain: Secondary | ICD-10-CM | POA: Diagnosis not present

## 2017-10-29 DIAGNOSIS — J449 Chronic obstructive pulmonary disease, unspecified: Secondary | ICD-10-CM | POA: Diagnosis not present

## 2017-10-29 DIAGNOSIS — S81801D Unspecified open wound, right lower leg, subsequent encounter: Secondary | ICD-10-CM | POA: Diagnosis not present

## 2017-10-29 DIAGNOSIS — J398 Other specified diseases of upper respiratory tract: Secondary | ICD-10-CM

## 2017-10-31 DIAGNOSIS — S81801D Unspecified open wound, right lower leg, subsequent encounter: Secondary | ICD-10-CM | POA: Diagnosis not present

## 2017-10-31 DIAGNOSIS — J449 Chronic obstructive pulmonary disease, unspecified: Secondary | ICD-10-CM | POA: Diagnosis not present

## 2017-10-31 DIAGNOSIS — L97211 Non-pressure chronic ulcer of right calf limited to breakdown of skin: Secondary | ICD-10-CM | POA: Diagnosis not present

## 2017-10-31 DIAGNOSIS — E119 Type 2 diabetes mellitus without complications: Secondary | ICD-10-CM | POA: Diagnosis not present

## 2017-10-31 DIAGNOSIS — G8929 Other chronic pain: Secondary | ICD-10-CM | POA: Diagnosis not present

## 2017-10-31 DIAGNOSIS — I10 Essential (primary) hypertension: Secondary | ICD-10-CM | POA: Diagnosis not present

## 2017-10-31 DIAGNOSIS — I872 Venous insufficiency (chronic) (peripheral): Secondary | ICD-10-CM | POA: Diagnosis not present

## 2017-10-31 DIAGNOSIS — M1990 Unspecified osteoarthritis, unspecified site: Secondary | ICD-10-CM | POA: Diagnosis not present

## 2017-10-31 DIAGNOSIS — F4310 Post-traumatic stress disorder, unspecified: Secondary | ICD-10-CM | POA: Diagnosis not present

## 2017-11-03 ENCOUNTER — Other Ambulatory Visit: Payer: Self-pay

## 2017-11-03 ENCOUNTER — Emergency Department (HOSPITAL_COMMUNITY): Payer: Medicare HMO

## 2017-11-03 ENCOUNTER — Encounter (HOSPITAL_COMMUNITY): Payer: Self-pay | Admitting: Emergency Medicine

## 2017-11-03 ENCOUNTER — Emergency Department (HOSPITAL_COMMUNITY)
Admission: EM | Admit: 2017-11-03 | Discharge: 2017-11-03 | Disposition: A | Discharge status: Home / Self Care | Payer: Medicare HMO | Attending: Emergency Medicine | Admitting: Emergency Medicine

## 2017-11-03 DIAGNOSIS — E039 Hypothyroidism, unspecified: Secondary | ICD-10-CM | POA: Diagnosis not present

## 2017-11-03 DIAGNOSIS — F17210 Nicotine dependence, cigarettes, uncomplicated: Secondary | ICD-10-CM | POA: Diagnosis not present

## 2017-11-03 DIAGNOSIS — R61 Generalized hyperhidrosis: Secondary | ICD-10-CM | POA: Insufficient documentation

## 2017-11-03 DIAGNOSIS — Z85118 Personal history of other malignant neoplasm of bronchus and lung: Secondary | ICD-10-CM | POA: Diagnosis not present

## 2017-11-03 DIAGNOSIS — Z7902 Long term (current) use of antithrombotics/antiplatelets: Secondary | ICD-10-CM | POA: Diagnosis not present

## 2017-11-03 DIAGNOSIS — R404 Transient alteration of awareness: Secondary | ICD-10-CM | POA: Diagnosis not present

## 2017-11-03 DIAGNOSIS — R0682 Tachypnea, not elsewhere classified: Secondary | ICD-10-CM | POA: Diagnosis not present

## 2017-11-03 DIAGNOSIS — R062 Wheezing: Secondary | ICD-10-CM | POA: Diagnosis not present

## 2017-11-03 DIAGNOSIS — E119 Type 2 diabetes mellitus without complications: Secondary | ICD-10-CM | POA: Insufficient documentation

## 2017-11-03 DIAGNOSIS — Z902 Acquired absence of lung [part of]: Secondary | ICD-10-CM | POA: Diagnosis not present

## 2017-11-03 DIAGNOSIS — R630 Anorexia: Secondary | ICD-10-CM | POA: Insufficient documentation

## 2017-11-03 DIAGNOSIS — Z79899 Other long term (current) drug therapy: Secondary | ICD-10-CM | POA: Insufficient documentation

## 2017-11-03 DIAGNOSIS — Z794 Long term (current) use of insulin: Secondary | ICD-10-CM | POA: Diagnosis not present

## 2017-11-03 DIAGNOSIS — R42 Dizziness and giddiness: Secondary | ICD-10-CM | POA: Insufficient documentation

## 2017-11-03 DIAGNOSIS — J449 Chronic obstructive pulmonary disease, unspecified: Secondary | ICD-10-CM | POA: Insufficient documentation

## 2017-11-03 DIAGNOSIS — R443 Hallucinations, unspecified: Secondary | ICD-10-CM | POA: Diagnosis not present

## 2017-11-03 LAB — URINALYSIS, ROUTINE W REFLEX MICROSCOPIC
Bilirubin Urine: NEGATIVE
Glucose, UA: NEGATIVE mg/dL
Hgb urine dipstick: NEGATIVE
Ketones, ur: 5 mg/dL — AB
Leukocytes, UA: NEGATIVE
Nitrite: NEGATIVE
Protein, ur: NEGATIVE mg/dL
Specific Gravity, Urine: 1.021 (ref 1.005–1.030)
pH: 5 (ref 5.0–8.0)

## 2017-11-03 LAB — CBC WITH DIFFERENTIAL/PLATELET
Basophils Absolute: 0 K/uL (ref 0.0–0.1)
Basophils Relative: 0 %
Eosinophils Absolute: 0.3 K/uL (ref 0.0–0.7)
Eosinophils Relative: 2 %
HCT: 48 % (ref 39.0–52.0)
Hemoglobin: 15.7 g/dL (ref 13.0–17.0)
Lymphocytes Relative: 30 %
Lymphs Abs: 4 K/uL (ref 0.7–4.0)
MCH: 30.2 pg (ref 26.0–34.0)
MCHC: 32.7 g/dL (ref 30.0–36.0)
MCV: 92.3 fL (ref 78.0–100.0)
Monocytes Absolute: 1 K/uL (ref 0.1–1.0)
Monocytes Relative: 7 %
Neutro Abs: 8 K/uL — ABNORMAL HIGH (ref 1.7–7.7)
Neutrophils Relative %: 61 %
Platelets: 259 K/uL (ref 150–400)
RBC: 5.2 MIL/uL (ref 4.22–5.81)
RDW: 13.7 % (ref 11.5–15.5)
WBC: 13.2 K/uL — ABNORMAL HIGH (ref 4.0–10.5)

## 2017-11-03 LAB — BASIC METABOLIC PANEL
Anion gap: 12 (ref 5–15)
BUN: 13 mg/dL (ref 6–20)
CALCIUM: 9.3 mg/dL (ref 8.9–10.3)
CO2: 24 mmol/L (ref 22–32)
CREATININE: 0.84 mg/dL (ref 0.61–1.24)
Chloride: 98 mmol/L — ABNORMAL LOW (ref 101–111)
Glucose, Bld: 153 mg/dL — ABNORMAL HIGH (ref 65–99)
Potassium: 3.9 mmol/L (ref 3.5–5.1)
Sodium: 134 mmol/L — ABNORMAL LOW (ref 135–145)

## 2017-11-03 LAB — I-STAT TROPONIN, ED: Troponin i, poc: 0 ng/mL (ref 0.00–0.08)

## 2017-11-03 MED ORDER — SODIUM CHLORIDE 0.9 % IV BOLUS: 1000.0000 mL | Freq: Once | INTRAVENOUS | Status: DC

## 2017-11-03 MED ORDER — IPRATROPIUM-ALBUTEROL 0.5-2.5 (3) MG/3ML IN SOLN
3.0000 mL | Freq: Once | RESPIRATORY_TRACT | Status: AC
  Administered 2017-11-03: 3 mL via RESPIRATORY_TRACT
  Filled 2017-11-03: qty 3

## 2017-11-03 NOTE — ED Triage Notes (Signed)
Per EMS: pt from home with c/o dizziness x2 days, hot/cold flashes, and productive cough.  Pt has a history of hypothyroidism, COPD, and a stroke (2016).

## 2017-11-03 NOTE — ED Notes (Signed)
Pt stable, ambulatory, and verbalizes understanding of d/c instructions.  

## 2017-11-03 NOTE — ED Provider Notes (Signed)
Patient complains of lightheadedness worse with standing or with walking for the past 2 days.  His wife reports that he has not been eating or drinking adequate amounts.  He does drink 8-9 cans of diet Mountain Dew per day.  He also has had hot flashes for 1 month intermittently.  He denies shortness of breath.  Denies fever.  He has had sweats for a month.  Denies any chest pain denies nausea.  He feels well while sitting here.  He does complain of feeling thirsty..  Patient given water to drink.  Patient likely dehydrated.  On exam no distress lungs clear to auscultation heart irregular  abdomen nondistended nontender.  Patient however and intravenous hydration which he declines.  He is encouraged to drink and follow-up with PMD   Orlie Dakin, MD 11/03/17 [phone]

## 2017-11-03 NOTE — Discharge Instructions (Addendum)
Your labs, EKG, chest x-ray, electrolytes, heart enzymes look ok.   We suspect your light-headedness is due to mild dehydration. You declined IV fluids today, please stay well hydrated. Drink 6-8 cups of water daily. Avoid sodas with high caffeine content, this will worsen dehydration.   Return for worsening light-headedness, chest pain, shortness of breath.   Follow up with primary care doctor in 24-48 hours for re-evaluation of light-headedness. Follow up with pulmonology as scheduled for further evaluation of lesion found on CT.

## 2017-11-03 NOTE — ED Provider Notes (Addendum)
[facility] EMERGENCY DEPARTMENT Provider Note   CSN: [id number] Arrival date & time: 11/03/17  1246     History   Chief Complaint Chief Complaint  Patient presents with  . Dizziness    HPI Brad Taylor is a 67 y.o. male w/ h/o AAA, COPD, tobacco abuse, DM on insulin, HLD, HTN, hypothyroidism, low testosterone, CVA here for evaluation of intermittent light-headedness since Saturday described as "feeling faint". Dizziness occurs with walking and relieved with rest. Son had to catch him so he wouldn't fall on Saturday when pt felt faint. Drinks 8-9 diet mountain dews a day.  Family states he has had decreased appetite and PO intake, heavy, unbearable sweats x 1 month, has seen PCP for this who obtained CXR, CT chest, labs and referred him to pulmonology for questionable lesion in lungs. He has h/o lung cancer s/p resection. Chronic wet sounding cough that he attributes to COPD, unchanged recently. Reports 10# unexpected weight loss in the last month with insomnia, just does not feel tired to go to sleep at night.  No congestion, sore throat, changes in cough/sputum, CP, SOB, palpitations, nausea, vomiting, abdominal pain. 1-2 days of diarrhea, resolved last week. No dysuria, hematuria. Has f/u appointment with pulmonology on 11/25/17.   HPI  Past Medical History:  Diagnosis Date  . AAA (abdominal aortic aneurysm) ([place]) 5/15   3.5x3.5 cm  . COPD (chronic obstructive pulmonary disease) ([place])   . Depression   . Diabetes mellitus without complication ([place])   . GERD (gastroesophageal reflux disease)   . Hyperlipidemia   . Hypothyroidism   . Post traumatic stress disorder   . Stroke ([place])   . Thyroid disease   . Ulcer     Patient Active Problem List   Diagnosis Date Noted  . Type 2 diabetes mellitus, uncontrolled ([place]) 07/25/2015  . Hemiplegia and hemiparesis following unspecified cerebrovascular disease affecting unspecified side ([place]) 07/25/2015  . Cellulitis,  gluteal, left 01/19/2014  . Tobacco abuse 01/19/2014  . Aphasia as late effect of stroke 11/09/2013  . CVA (cerebral infarction) 09/20/2013  . Stroke [facility]) 09/15/2013    Past Surgical History:  Procedure Laterality Date  . ABDOMINAL AORTOGRAM W/LOWER EXTREMITY N/A 08/20/2017   Procedure: ABDOMINAL AORTOGRAM W/LOWER EXTREMITY;  Surgeon: Waynetta Sandy, MD;  Location: [facility] CV LAB;  Service: Cardiovascular;  Laterality: N/A;  . CATARACT EXTRACTION    . CHOLECYSTECTOMY    . KNEE SURGERY    . LUNG REMOVAL, PARTIAL     right, 2 years ago at [facility] for cancer  . PERIPHERAL VASCULAR ATHERECTOMY Right 08/20/2017   Procedure: PERIPHERAL VASCULAR ATHERECTOMY;  Surgeon: Waynetta Sandy, MD;  Location: [facility] CV LAB;  Service: Cardiovascular;  Laterality: Right;  posterior tibial and tibeoperoneal trunk  . PERIPHERAL VASCULAR BALLOON ANGIOPLASTY Right 08/20/2017   Procedure: PERIPHERAL VASCULAR BALLOON ANGIOPLASTY;  Surgeon: Waynetta Sandy, MD;  Location: [facility] CV [facility];  Service: Cardiovascular;  Laterality: Right;  Anterior tibial        Home Medications    Prior to Admission medications   Medication Sig Start Date End Date Taking? Authorizing Provider  albuterol (PROVENTIL HFA;VENTOLIN HFA) 108 (90 Base) MCG/ACT inhaler INHALE 2 PUFFS INTO THE LUNGS EVERY 6 HOURS AS NEEDED FOR WHEEZE OR SHORTNESS OF BREATH 08/18/17   [name], MD  atorvastatin (LIPITOR) 10 MG tablet Take 5 mg by mouth daily. Takes 5 mg     [provider]  busPIRone (BUSPAR) 10 MG  tablet Take 0.5 tablets (5 mg total) by mouth 2 (two) times daily as needed (for anxiety). Patient taking differently: Take 5 mg by mouth 4 (four) times daily.  10/08/13   Angiulli, Lavon Paganini, PA-C  clopidogrel (PLAVIX) 75 MG tablet Take 1 tablet (75 mg total) by mouth daily with breakfast. 02/01/14   [name], NP  insulin glargine (LANTUS) 100 UNIT/ML injection Inject 40 Units into the skin 2  (two) times daily. s 10/08/13   [name], PA-C  Insulin Pen Needle (PEN NEEDLES 5/16") 30G X 8 MM MISC Use with insulin pen daily DX:E11.9 10/21/16   [name], MD  levothyroxine (SYNTHROID, LEVOTHROID) 125 MCG tablet Take 1 tablet (125 mcg total) by mouth daily before breakfast. 10/08/13   Angiulli, Lavon Paganini, PA-C  metFORMIN (GLUCOPHAGE) 500 MG tablet Take 500 mg by mouth 2 (two) times daily with a meal.    [provider]  morphine (MS CONTIN) 100 MG 12 hr tablet Take 100 mg by mouth every 12 (twelve) hours.    [provider]  Multiple Vitamins-Minerals (MULTIVITAMIN WITH MINERALS) tablet Take 1 tablet by mouth daily.    [provider]  RABEprazole (ACIPHEX) 20 MG tablet Take 2 tablets (40 mg total) by mouth daily before breakfast. 10/08/13   Angiulli, Lavon Paganini, PA-C  tamsulosin (FLOMAX) 0.4 MG CAPS capsule Take 0.4 mg by mouth daily.    [provider]  testosterone cypionate (DEPOTESTOSTERONE CYPIONATE) 200 MG/ML injection Inject 1 mL (200 mg total) into the muscle every 14 (fourteen) days. 09/19/17   [name], MD  traZODone (DESYREL) 100 MG tablet Take 3 tablets (300 mg total) by mouth at bedtime. 10/08/13   Angiulli, Lavon Paganini, PA-C  venlafaxine XR (EFFEXOR-XR) 150 MG 24 hr capsule Take 1 capsule (150 mg total) by mouth daily with breakfast. Patient taking differently: 150 mg 2 (two) times daily.  10/08/13   Angiulli, Lavon Paganini, PA-C    Family History Family History  Problem Relation Age of Onset  . Emphysema Father   . Thyroid disease Sister   . Stroke Maternal Grandmother   . Thyroid disease Sister   . Thyroid disease Sister     Social History Social History   Tobacco Use  . Smoking status: Current Every Day Smoker    Packs/day: 1.00    Years: 30.00    Pack years: 30.00    Types: Cigarettes  . Smokeless tobacco: Former Systems developer    Quit date: 05/04/2013  Substance Use Topics  . Alcohol use: No    Comment: Former heavy drinker,  been sober for 10 years  . Drug use: No     Allergies   Patient has no known allergies.   Review of Systems Review of Systems  Constitutional: Positive for appetite change, diaphoresis and unexpected weight change.  Respiratory: Positive for cough (chronic).   Neurological: Positive for dizziness.  All other systems reviewed and are negative.    Physical Exam Updated Vital Signs BP 115/81   Pulse 90   Temp 98.5 F (36.9 C) (Rectal)   Resp (!) 31   Ht 6' (1.829 m)   Wt 108.9 kg (240 lb)   SpO2 96%   BMI 32.55 kg/m   Physical Exam  Constitutional: He is oriented to person, place, and time. He appears well-developed and well-nourished. No distress.  Non toxic. Eating sandwich.   HENT:  Head: Normocephalic and atraumatic.  Nose: Nose normal.  Mouth/Throat: No oropharyngeal exudate.  Moist mucous membranes   Eyes: Pupils are equal, round, and reactive to light. Conjunctivae and EOM are normal.  Neck: Normal range of motion.  Cardiovascular: Normal rate, regular rhythm, normal heart sounds and intact distal pulses.  No murmur heard. 2+ DP and radial pulses bilaterally. No LE edema.   Pulmonary/Chest: Effort normal. Tachypnea noted. He has wheezes.  Faint expiratory wheezing posterior lower lobes bilaterally. No rales. Easy WOB.   Abdominal: Soft. Bowel sounds are normal. There is no tenderness.  No G/R/R. No suprapubic or CVA tenderness.   Musculoskeletal: Normal range of motion. He exhibits no deformity.  Neurological: He is alert and oriented to person, place, and time.  Skin: Skin is warm and dry. Capillary refill takes less than 2 seconds.  RLE wrapped with dry, clean, bandage.   Psychiatric: He has a normal mood and affect. His behavior is normal. Judgment and thought content normal.  Nursing note and vitals reviewed.    ED Treatments / Results  Labs (all labs ordered are listed, but only abnormal results are displayed) Labs Reviewed  CBC WITH  DIFFERENTIAL/PLATELET - Abnormal; Notable for the following components:      Result Value   WBC 13.2 (*)    Neutro Abs 8.0 (*)    All other components within normal limits  BASIC METABOLIC PANEL - Abnormal; Notable for the following components:   Sodium 134 (*)    Chloride 98 (*)    Glucose, Bld 153 (*)    All other components within normal limits  URINALYSIS, ROUTINE W REFLEX MICROSCOPIC - Abnormal; Notable for the following components:   Ketones, ur 5 (*)    All other components within normal limits  URINE CULTURE  CULTURE, BLOOD (ROUTINE X 2)  CULTURE, BLOOD (ROUTINE X 2)  I-STAT TROPONIN, ED    EKG EKG Interpretation  Date/Time:  Monday November 03 2017 12:50:34 EDT Ventricular Rate:  92 PR Interval:    QRS Duration: 78 QT Interval:  359 QTC Calculation: 445 R Axis:   14 Text Interpretation:  Sinus rhythm Multiple ventricular premature complexes Borderline low voltage, extremity leads Abnormal R-wave progression, early transition No significant change since last tracing Confirmed by Orlie Dakin [phone]) on 11/03/2017 2:49:17 PM   Radiology Dg Chest 2 View  Result Date: 11/03/2017 CLINICAL DATA:  Dizziness for 2 days. EXAM: CHEST - 2 VIEW COMPARISON:  PA and lateral chest 10/23/2017.  CT chest 10/28/2017. FINDINGS: Volume loss in the right chest with elevation of the right hemidiaphragm related to prior surgery is again seen. Right perihilar opacity correlates with scarring and fluid in the aortic recess. No acute bony abnormality is seen. IMPRESSION: No acute disease.  No change compared to the prior exams. Electronically Signed   By: Inge Rise M.D.   On: 11/03/2017 15:03    Procedures Procedures (including critical care time)  Medications Ordered in ED Medications  sodium chloride 0.9 % bolus 1,000 mL (1,000 mLs Intravenous Refused 11/03/17 1653)  ipratropium-albuterol (DUONEB) 0.5-2.5 (3) MG/3ML nebulizer solution 3 mL (3 mLs Nebulization Given 11/03/17 1427)      Initial Impression / Assessment and Plan / ED Course  I have reviewed the triage vital signs and the nursing notes.  Pertinent labs & imaging results that were available during my care of the patient were reviewed by me and considered in my medical decision making (see chart for details).    67 yo here for light-headedness, ?pre-syncope with ambulation. Heavy intake of caffeinated sodas, decreased PO intake.  Non compliant with OSA tx. Exam as above reassuring, VS WNL. Suspect mild dehydration. He has no CP, palpitations, SOB. So ACS or PE would be surprising in this patient. Last Echo on 2015 WNL. Mild CAS on last ultrasound. Evaluated by PCP for ongoing heavy sweats with suspicious nodular lesion to lower trachea and hypogonadism injection.   Lab work remarkable for mild leukocytosis WBC 13.2. He has a chronic, improving RLE wound that is healing slowly per family taken care of by wound center. CXR, EKG trop unremarkable. Will start IVF, anticipate dc.   Final Clinical Impressions(s) / ED Diagnoses   1640: Pt declining IVF. He has been tolerating fluids PO. Discussed risks of leaving AMA and he understands possible worsening condition, dehydration, syncope, fall, death. He is to f/u with PCP as soon as possible. Has f/u with pulmonology for evaluation of lung lesions/sweats. Discussed return precautions.  Final diagnoses:  Light headedness    ED Discharge Orders    None       Arlean Hopping 11/03/17 1709    Orlie Dakin, MD 11/03/17 1750

## 2017-11-03 NOTE — ED Notes (Signed)
Pt has right sided weakness from a previous stroke.  Wife stated stroke was in 2016.

## 2017-11-04 ENCOUNTER — Telehealth: Payer: Self-pay | Admitting: Family Medicine

## 2017-11-04 DIAGNOSIS — R232 Flushing: Secondary | ICD-10-CM

## 2017-11-04 DIAGNOSIS — R6883 Chills (without fever): Secondary | ICD-10-CM

## 2017-11-04 DIAGNOSIS — R5382 Chronic fatigue, unspecified: Secondary | ICD-10-CM

## 2017-11-04 LAB — URINE CULTURE

## 2017-11-04 NOTE — Telephone Encounter (Signed)
Referral ok'd by Dr. Dennard Schaumann. Referral placed.

## 2017-11-04 NOTE — Telephone Encounter (Signed)
Patients wife called and said her husband would like referral to this endocrinologist    Dr Hipolito Bayley in [place]  [phone] Wife says asap if possible

## 2017-11-05 ENCOUNTER — Encounter (HOSPITAL_BASED_OUTPATIENT_CLINIC_OR_DEPARTMENT_OTHER): Payer: Non-veteran care | Attending: Internal Medicine

## 2017-11-05 DIAGNOSIS — F17210 Nicotine dependence, cigarettes, uncomplicated: Secondary | ICD-10-CM | POA: Insufficient documentation

## 2017-11-05 DIAGNOSIS — J449 Chronic obstructive pulmonary disease, unspecified: Secondary | ICD-10-CM | POA: Insufficient documentation

## 2017-11-05 DIAGNOSIS — E11622 Type 2 diabetes mellitus with other skin ulcer: Secondary | ICD-10-CM | POA: Insufficient documentation

## 2017-11-05 DIAGNOSIS — I69359 Hemiplegia and hemiparesis following cerebral infarction affecting unspecified side: Secondary | ICD-10-CM | POA: Insufficient documentation

## 2017-11-05 DIAGNOSIS — L97812 Non-pressure chronic ulcer of other part of right lower leg with fat layer exposed: Secondary | ICD-10-CM | POA: Insufficient documentation

## 2017-11-05 DIAGNOSIS — L97212 Non-pressure chronic ulcer of right calf with fat layer exposed: Secondary | ICD-10-CM | POA: Diagnosis not present

## 2017-11-07 ENCOUNTER — Ambulatory Visit (INDEPENDENT_AMBULATORY_CARE_PROVIDER_SITE_OTHER): Payer: Medicare HMO | Admitting: Family Medicine

## 2017-11-07 ENCOUNTER — Encounter: Payer: Self-pay | Admitting: Family Medicine

## 2017-11-07 VITALS — BP 142/60 | HR 84 | Temp 97.9°F | Resp 20 | Ht 70.75 in | Wt 247.0 lb

## 2017-11-07 DIAGNOSIS — R5382 Chronic fatigue, unspecified: Secondary | ICD-10-CM

## 2017-11-07 DIAGNOSIS — F4310 Post-traumatic stress disorder, unspecified: Secondary | ICD-10-CM | POA: Diagnosis not present

## 2017-11-07 DIAGNOSIS — I10 Essential (primary) hypertension: Secondary | ICD-10-CM | POA: Diagnosis not present

## 2017-11-07 DIAGNOSIS — Z125 Encounter for screening for malignant neoplasm of prostate: Secondary | ICD-10-CM | POA: Diagnosis not present

## 2017-11-07 DIAGNOSIS — I872 Venous insufficiency (chronic) (peripheral): Secondary | ICD-10-CM | POA: Diagnosis not present

## 2017-11-07 DIAGNOSIS — R61 Generalized hyperhidrosis: Secondary | ICD-10-CM | POA: Diagnosis not present

## 2017-11-07 DIAGNOSIS — L97211 Non-pressure chronic ulcer of right calf limited to breakdown of skin: Secondary | ICD-10-CM | POA: Diagnosis not present

## 2017-11-07 DIAGNOSIS — J449 Chronic obstructive pulmonary disease, unspecified: Secondary | ICD-10-CM | POA: Diagnosis not present

## 2017-11-07 DIAGNOSIS — R232 Flushing: Secondary | ICD-10-CM

## 2017-11-07 DIAGNOSIS — E119 Type 2 diabetes mellitus without complications: Secondary | ICD-10-CM | POA: Diagnosis not present

## 2017-11-07 DIAGNOSIS — M1990 Unspecified osteoarthritis, unspecified site: Secondary | ICD-10-CM | POA: Diagnosis not present

## 2017-11-07 DIAGNOSIS — E291 Testicular hypofunction: Secondary | ICD-10-CM

## 2017-11-07 DIAGNOSIS — S81801D Unspecified open wound, right lower leg, subsequent encounter: Secondary | ICD-10-CM | POA: Diagnosis not present

## 2017-11-07 DIAGNOSIS — G8929 Other chronic pain: Secondary | ICD-10-CM | POA: Diagnosis not present

## 2017-11-07 MED ORDER — GABAPENTIN 300 MG PO CAPS: 300.0000 mg | ORAL_CAPSULE | Freq: Three times a day (TID) | ORAL | 3 refills | Status: DC

## 2017-11-07 MED ORDER — GABAPENTIN 300 MG PO CAPS: 300.0000 mg | ORAL_CAPSULE | Freq: Three times a day (TID) | ORAL | 3 refills | Status: AC

## 2017-11-07 NOTE — Progress Notes (Signed)
Subjective:    Patient ID: Brad Taylor, male    DOB: 1951-06-30, 67 y.o.   MRN: [id number]  Constipation     07/2017 Patient presents with 1 week of worsening chest congestion, audible wheezing, increasing shortness of breath, and head congestion.  On examination today, the patient has markedly diminished breath sounds left greater than right.  He has diffuse rhonchorous breath sounds but he has right basilar rales heard posteriorly.  He has diffuse expiratory wheezing.  He has diminished breath sounds bilaterally.  He has not been using albuterol.  He denies any hemoptysis.  He denies any chest pain.  He denies any fevers or chills or pleurisy.  At that time, my plan was: Patient appears to have a COPD exacerbation.  Begin prednisone taper pack for the bronchospasms appreciated on exam in addition to albuterol 2 puffs every 6 hours as needed.  Add Levaquin 500 mg p.o. daily for 7 days particularly given the rales appreciated in the right lower lobe.  Recheck next week or sooner if worse.  09/01/17 Patient presents with 2-3 weeks of hot and cold flashes. It sounds like he may be running fevers. He will start sweating for no reason. He will wake up drenched with sweat. They have not checked his temperature. During that same time, he reports worsening constipation. He is on morphine to the care of the [place]. He is increase the amount of morphine that he is taking due to pain in his right leg. On examination, there is a large wound,/venous stasis ulcer on the lateral aspect of his right shin. It is actually a complex of several small wounds of coalesced and one large. The superior wound is 11 cm x 6 cm in an "I" shaped configuration. The smaller wound is a 6 cm circular ulcer down to the underlying muscle/fascia. There is a foul odor emanating from the wound. There is opaque yellowish-green exudate coming from the wound. The surrounding skin is erythematous and warm and painful. It appears to be  secondarily infected. I believe this may be the source of his hot and cold flashes and suspected fevers. Recently saw vascular surgery who improved the blood flow in his right leg.  He underwent atherectomy and balloon angioplasty of the tibioperoneal trunk.  Previously had a heavily calcified 70% stenosis that was improved to 0% after the procedure earlier this month.  Hopefully this will help with this ulcer and healing but I do believe the patient is at high risk for losing his extremity if the wound will not heal.  At that time, my plan was: I believe the patient has a venous stasis ulcer that has become secondarily infected. It is been a nonhealing ulcer now for more than a year and cared for at the [place] and I believe the nonhealing aspect is likely due to arterial insufficiency. Begin Bactrim double strength tablets 1 by mouth twice a day and recheck later this week to see if the cellulitis is improving. Consult wound clinic as soon as possible to help facilitate healing of the wound. Obtain lab work to workup other potential causes of fever of unknown origin. Start movantik 25 mg a day for opiate-induced constipation. I believe his sleeping is likely due to the fact he states he more morphine. He apparently was screen for obstructive sleep apnea at the [place] last year and was found to not have that according to the patient.    09/04/17 Patient is doing much better.  Constipation resolved  on movantik.  His leg feels much better and he is having to take less morphine.  He has an appointment to see the wound clinic of Wednesday of next week.  The erythema in the right lower extremity has faded though still present.  The discharge has decreased dramatically.  The smell is much improved.  The warmth has improved and is not near as hot nor is painful to the touch.  The cellulitis seems to be resolving albeit slowly.  At that time, my plan was: The wound on his right leg is essentially unchanged.  However the  cellulitis seems to be improving.  His fever seem to be improving.  His pain is definitely improved.  The discharge and the swelling is much better.  Complete my Bactrim that I prescribed earlier this week.  I would extend the course for an additional 7 days to complete 14 days total.  He was given 10 days of clindamycin at the [place] yesterday.  I believe it is fine to switch from the Bactrim to the clindamycin as they essentially are covering Staph however the clindamycin may also cover anaerobic bacteria.  Therefore I am perfectly acceptable of the switch.  Follow-up with the wound clinic as discussed.  09/19/17 Patient is here today with his wife complaining of severe fatigue.  He states that he has no energy.  He denies any chest pain or shortness of breath.  He reports very little strength or stamina or desire to do anything.  He also reports hot flashes.  He is checked his temperature and they are not fevers.  His wound on his leg is improving under the care of a wound clinic.  His lab workup previously did show significant hypogonadism with a total testosterone level of 84.  However he is falling asleep during our office visit.  He dozes off as I talk to him and has to startle himself back awake.  I explained to the patient that this would not be due to hypogonadism and most likely suggests sleep apnea versus narcolepsy.  He states that in the remote past he had a sleep study that diagnosed him with sleep apnea however he is receiving no treatment.  He reports feeling sleepy and tired all the time.  He can easily fall asleep during conversations, after lunch, reading a book, watching TV, riding in a car.  He falls asleep constantly.  His Epworth sleepiness score would therefore be over 18!  At that time, my plan was: This is multifactorial and due to a combination of hypogonadism as well as I anticipate obstructive sleep apnea.  I recommended a referral to a sleep specialist for a split level sleep study.  I  believe the patient would significantly benefit from CPAP therapy as far as treating his hypersomnolence and excessive daytime sleepiness.  I am sure this will also help with his fatigue.  However I believe also receiving testosterone replacement would also help with his fatigue and hot flashes.  Therefore I will start him on testosterone cypionate 200 mg IM every 2 weeks.  The wife will pick up the supplies and bring them back to the clinic so that we can teach her how to administer the shots.  Recheck all labs in 3 months.  We discussed the increased risk of cardiovascular events on testosterone as well as increased risk of prostate cancer and the patient is willing to accept these risk  10/23/17 Since I last saw him, he has refused the sleep study.  He states that he is claustrophobic and he refuses to wear the mask and therefore he does not want to be evaluated.  He continues to have daily hot flashes that are insufferable.  He does not even want to leave the house due to the hot and cold flashes he continues to feel.  He is received 2 doses of testosterone over the last 4 weeks but has seen no improvement in the hot flashes over the fatigue.  He is afebrile.  He does have a cough and chest congestion however he has no other symptoms of any infection.  He denies any sore throat.  He denies any otalgia or sinus pain.  He denies any nausea vomiting or diarrhea.  He denies any dysuria.  On exam today, he does have left posterior basilar wheezes and crackles which are asymmetric and abnormal.  Otherwise his exam is unremarkable.  His wife is concerned because we have not checked a vitamin B12 level.  Previous lab work showed significant hypogonadism and a normal TSH.  At that time, my plan was: Hot flashes could possibly be due to testosterone deficiency however I am concerned about underlying malignancy.  Given his cough, his abnormal pulmonary exam, I will proceed with a chest x-ray.  Given his chronic fatigue  I will check a vitamin B12 along with a CBC.  Given his hot flashes which potentially could be fevers and chronic fatigue I will perform a serum protein electrophoresis to evaluate for multiple myeloma.  I believe his fatigue is still likely multifactorial and related to smoking, hypogonadism, and untreated obstructive sleep apnea.  Addendum (10/29/17) Lab work was unremarkable.  Chest x-ray showed a possible right hilar mass.  Patient has a past medical history of partial right lung resection due to cancer performed at the [place] 2 years ago.  He has no pulmonologist there who follows with him.  CT scan was performed which suggest scar tissue in the right lung was the cause of the opacity seen on the chest x-ray.  However there was a polypoid mass in the trachea of uncertain significance.  Possible mucus however cancer cannot be ruled out.  Given the patient's night sweats and fatigue, I have recommended pulmonary consult for possible bronchoscopy to evaluate the lesion seen in his distal trachea I discussed these findings with his wife and she is in agreement.  Consult pulmonary.  11/07/17 Patient went to the hospital since I last saw him.  He continues to endorse sudden rapid hot flashes.  Instance while sitting on the exam table, he rapidly starts taking off his jacket and rocking uncontrollably stating that he needs to get out of this room is too hot.  However he is not warm to the touch.  He is not visibly diaphoretic or erythematous.  Patient has seen no benefit on testosterone however I have not recheck the level since he started the medication almost 2 months ago to ensure adequate dosage.  I have not screen the patient for TB.  I have not check blood cultures although I do not believe his story is consistent with fever of unknown origin.  The wound on his right leg is healing nicely under the care of the wound clinic.  There is no evidence of cellulitis. Past Medical History:  Diagnosis Date  . AAA  (abdominal aortic aneurysm) ([place]) 5/15   3.5x3.5 cm  . COPD (chronic obstructive pulmonary disease) ([place])   . Depression   . Diabetes mellitus without complication ([place])   .  GERD (gastroesophageal reflux disease)   . Hyperlipidemia   . Hypothyroidism   . Post traumatic stress disorder   . Stroke ([place])   . Thyroid disease   . Ulcer    Past Surgical History:  Procedure Laterality Date  . ABDOMINAL AORTOGRAM W/LOWER EXTREMITY N/A 08/20/2017   Procedure: ABDOMINAL AORTOGRAM W/LOWER EXTREMITY;  Surgeon: Waynetta Sandy, MD;  Location: [facility];  Service: Cardiovascular;  Laterality: N/A;  . CATARACT EXTRACTION    . CHOLECYSTECTOMY    . KNEE SURGERY    . LUNG REMOVAL, PARTIAL     right, 2 years ago at [facility] for cancer  . PERIPHERAL VASCULAR ATHERECTOMY Right 08/20/2017   Procedure: PERIPHERAL VASCULAR ATHERECTOMY;  Surgeon: Waynetta Sandy, MD;  Location: [facility];  Service: Cardiovascular;  Laterality: Right;  posterior tibial and tibeoperoneal trunk  . PERIPHERAL VASCULAR BALLOON ANGIOPLASTY Right 08/20/2017   Procedure: PERIPHERAL VASCULAR BALLOON ANGIOPLASTY;  Surgeon: Waynetta Sandy, MD;  Location: [facility];  Service: Cardiovascular;  Laterality: Right;  Anterior tibial   Current Outpatient Medications on File Prior to Visit  Medication Sig Dispense Refill  . albuterol (PROVENTIL HFA;VENTOLIN HFA) 108 (90 Base) MCG/ACT inhaler INHALE 2 PUFFS INTO THE LUNGS EVERY 6 HOURS AS NEEDED FOR WHEEZE OR SHORTNESS OF BREATH 18 g 3  . atorvastatin (LIPITOR) 10 MG tablet Take 5 mg by mouth daily. Takes 5 mg     . busPIRone (BUSPAR) 10 MG tablet Take 0.5 tablets (5 mg total) by mouth 2 (two) times daily as needed (for anxiety). (Patient taking differently: Take 5 mg by mouth 4 (four) times daily. ) 60 tablet 0  . clopidogrel (PLAVIX) 75 MG tablet Take 1 tablet (75 mg total) by mouth daily with breakfast. 90 tablet 3  . insulin glargine (LANTUS)  100 UNIT/ML injection Inject 40 Units into the skin 2 (two) times daily. s    . Insulin Pen Needle (PEN NEEDLES 5/16") 30G X 8 MM MISC Use with insulin pen daily DX:E11.9 100 each 5  . levothyroxine (SYNTHROID, LEVOTHROID) 125 MCG tablet Take 1 tablet (125 mcg total) by mouth daily before breakfast. 30 tablet 1  . metFORMIN (GLUCOPHAGE) 500 MG tablet Take 500 mg by mouth 2 (two) times daily with a meal.    . morphine (MS CONTIN) 100 MG 12 hr tablet Take 100 mg by mouth every 12 (twelve) hours.    . Multiple Vitamins-Minerals (MULTIVITAMIN WITH MINERALS) tablet Take 1 tablet by mouth daily.    . RABEprazole (ACIPHEX) 20 MG tablet Take 2 tablets (40 mg total) by mouth daily before breakfast. 30 tablet 1  . tamsulosin (FLOMAX) 0.4 MG CAPS capsule Take 0.4 mg by mouth daily.    Marland Kitchen testosterone cypionate (DEPOTESTOSTERONE CYPIONATE) 200 MG/ML injection Inject 1 mL (200 mg total) into the muscle every 14 (fourteen) days. 10 mL 0  . traZODone (DESYREL) 100 MG tablet Take 3 tablets (300 mg total) by mouth at bedtime. 30 tablet 0  . venlafaxine XR (EFFEXOR-XR) 150 MG 24 hr capsule Take 1 capsule (150 mg total) by mouth daily with breakfast. (Patient taking differently: 150 mg 2 (two) times daily. ) 30 capsule 1   No current facility-administered medications on file prior to visit.    No Known Allergies Social History   Socioeconomic History  . Marital status: Married    Spouse name: Diane  . Number of children: 1  . Years of education: HS  . Highest education level: Not  on file  Occupational History  . Occupation: Retired  Scientific laboratory technician  . Financial resource strain: Not on file  . Food insecurity:    Worry: Not on file    Inability: Not on file  . Transportation needs:    Medical: Not on file    Non-medical: Not on file  Tobacco Use  . Smoking status: Current Every Day Smoker    Packs/day: 1.00    Years: 30.00    Pack years: 30.00    Types: Cigarettes  . Smokeless tobacco: Former Systems developer     Quit date: 05/04/2013  Substance and Sexual Activity  . Alcohol use: No    Comment: Former heavy drinker, been sober for 10 years  . Drug use: No  . Sexual activity: Not on file  Lifestyle  . Physical activity:    Days per week: Not on file    Minutes per session: Not on file  . Stress: Not on file  Relationships  . Social connections:    Talks on phone: Not on file    Gets together: Not on file    Attends religious service: Not on file    Active member of club or organization: Not on file    Attends meetings of clubs or organizations: Not on file    Relationship status: Not on file  . Intimate partner violence:    Fear of current or ex partner: Not on file    Emotionally abused: Not on file    Physically abused: Not on file    Forced sexual activity: Not on file  Other Topics Concern  . Not on file  Social History Narrative   Patient lives at home with spouse.   Caffeine Use: 3-4 cups daily   Retired Dealer.      Review of Systems  Gastrointestinal: Positive for constipation.  All other systems reviewed and are negative.      Objective:   Physical Exam  Constitutional: He appears well-developed and well-nourished. No distress.  HENT:  Right Ear: External ear normal.  Left Ear: External ear normal.  Nose: Nose normal.  Mouth/Throat: Oropharynx is clear and moist. No oropharyngeal exudate.  Eyes: Conjunctivae are normal.  Neck: Neck supple.  Cardiovascular: Normal rate, regular rhythm and normal heart sounds.  No murmur heard. Pulmonary/Chest: No accessory muscle usage. No respiratory distress. He has no wheezes. He has no rales.  Skin: Lesion noted. He is not diaphoretic.  Vitals reviewed.         Assessment & Plan:  Night sweats - Plan: QuantiFERON-TB Gold Plus, Culture, blood (single) w Reflex to ID Panel, Culture, blood (single) w Reflex to ID Panel, Testosterone Total,Free,Bio, Males, PSA  Hot flashes - Plan: QuantiFERON-TB Gold Plus, Culture,  blood (single) w Reflex to ID Panel, Culture, blood (single) w Reflex to ID Panel, Testosterone Total,Free,Bio, Males, PSA, gabapentin (NEURONTIN) 300 MG capsule  Hypogonadism in male - Plan: QuantiFERON-TB Gold Plus, Culture, blood (single) w Reflex to ID Panel, Culture, blood (single) w Reflex to ID Panel, Testosterone Total,Free,Bio, Males, PSA  Chronic fatigue - Plan: QuantiFERON-TB Gold Plus, Culture, blood (single) w Reflex to ID Panel, Culture, blood (single) w Reflex to ID Panel, Testosterone Total,Free,Bio, Males, PSA  I apologized to the patient and his wife.  I cannot find anything else to do to treat his condition.  I will recheck a testosterone level to ensure that he is absorbing the shots adequately.  If his testosterone level is normal, I will consult endocrinology to  see if they have any other suggestions.  Meanwhile I will try gabapentin 300 mg p.o. 3 times daily off label to treat possible hot flashes related to testosterone deficiency.  I will screen the patient for tuberculosis given his past service in the [employer] in a [place] country for possible signs of reactivation of TB although I feel this is highly unlikely.  I will check blood cultures x2 to evaluate for silent bacteremia.  I will screen a PSA to ensure that there is no evidence of change since the initiation of testosterone.

## 2017-11-08 LAB — CULTURE, BLOOD (ROUTINE X 2)
CULTURE: NO GROWTH
CULTURE: NO GROWTH
Special Requests: ADEQUATE
Special Requests: ADEQUATE

## 2017-11-09 LAB — QUANTIFERON-TB GOLD PLUS
NIL: 0.01 IU/mL
QUANTIFERON-TB GOLD PLUS: NEGATIVE
TB1-NIL: 0.02 [IU]/mL
TB2-NIL: 0.02 [IU]/mL

## 2017-11-10 ENCOUNTER — Other Ambulatory Visit: Payer: Self-pay | Admitting: Family Medicine

## 2017-11-10 DIAGNOSIS — R232 Flushing: Secondary | ICD-10-CM

## 2017-11-10 LAB — TESTOSTERONE TOTAL,FREE,BIO, MALES
ALBUMIN MSPROF: 4.3 g/dL (ref 3.6–5.1)
Sex Hormone Binding: 45 nmol/L (ref 22–77)
TESTOSTERONE BIOAVAILABLE: 156.7 ng/dL (ref >=110.0)
Testosterone, Free: 79.6 pg/mL (ref 46.0–224.0)
Testosterone: 718 ng/dL (ref 250–827)

## 2017-11-10 LAB — PSA: PSA: 3 ng/mL (ref <=4.0)

## 2017-11-13 LAB — CULTURE, BLOOD (SINGLE)
MICRO NUMBER: 90424470
MICRO NUMBER:: 90424506
RESULT: NO GROWTH
Result:: NO GROWTH
SPECIMEN QUALITY: ADEQUATE
SPECIMEN QUALITY:: ADEQUATE

## 2017-11-14 DIAGNOSIS — J449 Chronic obstructive pulmonary disease, unspecified: Secondary | ICD-10-CM | POA: Diagnosis not present

## 2017-11-14 DIAGNOSIS — L97211 Non-pressure chronic ulcer of right calf limited to breakdown of skin: Secondary | ICD-10-CM | POA: Diagnosis not present

## 2017-11-14 DIAGNOSIS — G8929 Other chronic pain: Secondary | ICD-10-CM | POA: Diagnosis not present

## 2017-11-14 DIAGNOSIS — I10 Essential (primary) hypertension: Secondary | ICD-10-CM | POA: Diagnosis not present

## 2017-11-14 DIAGNOSIS — F4310 Post-traumatic stress disorder, unspecified: Secondary | ICD-10-CM | POA: Diagnosis not present

## 2017-11-14 DIAGNOSIS — M1990 Unspecified osteoarthritis, unspecified site: Secondary | ICD-10-CM | POA: Diagnosis not present

## 2017-11-14 DIAGNOSIS — I872 Venous insufficiency (chronic) (peripheral): Secondary | ICD-10-CM | POA: Diagnosis not present

## 2017-11-14 DIAGNOSIS — S81801D Unspecified open wound, right lower leg, subsequent encounter: Secondary | ICD-10-CM | POA: Diagnosis not present

## 2017-11-14 DIAGNOSIS — E119 Type 2 diabetes mellitus without complications: Secondary | ICD-10-CM | POA: Diagnosis not present

## 2017-11-17 DIAGNOSIS — F4310 Post-traumatic stress disorder, unspecified: Secondary | ICD-10-CM | POA: Diagnosis not present

## 2017-11-17 DIAGNOSIS — S81801D Unspecified open wound, right lower leg, subsequent encounter: Secondary | ICD-10-CM | POA: Diagnosis not present

## 2017-11-17 DIAGNOSIS — E119 Type 2 diabetes mellitus without complications: Secondary | ICD-10-CM | POA: Diagnosis not present

## 2017-11-17 DIAGNOSIS — I10 Essential (primary) hypertension: Secondary | ICD-10-CM | POA: Diagnosis not present

## 2017-11-17 DIAGNOSIS — G8929 Other chronic pain: Secondary | ICD-10-CM | POA: Diagnosis not present

## 2017-11-17 DIAGNOSIS — I872 Venous insufficiency (chronic) (peripheral): Secondary | ICD-10-CM | POA: Diagnosis not present

## 2017-11-17 DIAGNOSIS — J449 Chronic obstructive pulmonary disease, unspecified: Secondary | ICD-10-CM | POA: Diagnosis not present

## 2017-11-17 DIAGNOSIS — L97211 Non-pressure chronic ulcer of right calf limited to breakdown of skin: Secondary | ICD-10-CM | POA: Diagnosis not present

## 2017-11-17 DIAGNOSIS — M1990 Unspecified osteoarthritis, unspecified site: Secondary | ICD-10-CM | POA: Diagnosis not present

## 2017-11-19 ENCOUNTER — Other Ambulatory Visit (HOSPITAL_COMMUNITY)
Admission: RE | Admit: 2017-11-19 | Discharge: 2017-11-19 | Disposition: A | Discharge status: Home / Self Care | Payer: Non-veteran care | Source: Other Acute Inpatient Hospital | Attending: Physician Assistant | Admitting: Physician Assistant

## 2017-11-19 DIAGNOSIS — J449 Chronic obstructive pulmonary disease, unspecified: Secondary | ICD-10-CM | POA: Diagnosis not present

## 2017-11-19 DIAGNOSIS — L97812 Non-pressure chronic ulcer of other part of right lower leg with fat layer exposed: Secondary | ICD-10-CM | POA: Insufficient documentation

## 2017-11-19 DIAGNOSIS — E11622 Type 2 diabetes mellitus with other skin ulcer: Secondary | ICD-10-CM | POA: Diagnosis not present

## 2017-11-19 DIAGNOSIS — F17210 Nicotine dependence, cigarettes, uncomplicated: Secondary | ICD-10-CM | POA: Diagnosis not present

## 2017-11-19 DIAGNOSIS — L089 Local infection of the skin and subcutaneous tissue, unspecified: Secondary | ICD-10-CM | POA: Diagnosis not present

## 2017-11-19 DIAGNOSIS — I69359 Hemiplegia and hemiparesis following cerebral infarction affecting unspecified side: Secondary | ICD-10-CM | POA: Diagnosis not present

## 2017-11-19 DIAGNOSIS — L97219 Non-pressure chronic ulcer of right calf with unspecified severity: Secondary | ICD-10-CM | POA: Diagnosis not present

## 2017-11-20 ENCOUNTER — Other Ambulatory Visit: Payer: Self-pay | Admitting: Family Medicine

## 2017-11-20 NOTE — Telephone Encounter (Signed)
Refill on movantik 25mg  to [facility].

## 2017-11-23 LAB — AEROBIC CULTURE W GRAM STAIN (SUPERFICIAL SPECIMEN)

## 2017-11-23 LAB — AEROBIC CULTURE  (SUPERFICIAL SPECIMEN)

## 2017-11-24 DIAGNOSIS — J449 Chronic obstructive pulmonary disease, unspecified: Secondary | ICD-10-CM | POA: Diagnosis not present

## 2017-11-24 DIAGNOSIS — E119 Type 2 diabetes mellitus without complications: Secondary | ICD-10-CM | POA: Diagnosis not present

## 2017-11-24 DIAGNOSIS — I872 Venous insufficiency (chronic) (peripheral): Secondary | ICD-10-CM | POA: Diagnosis not present

## 2017-11-24 DIAGNOSIS — M1990 Unspecified osteoarthritis, unspecified site: Secondary | ICD-10-CM | POA: Diagnosis not present

## 2017-11-24 DIAGNOSIS — S81801D Unspecified open wound, right lower leg, subsequent encounter: Secondary | ICD-10-CM | POA: Diagnosis not present

## 2017-11-24 DIAGNOSIS — F4310 Post-traumatic stress disorder, unspecified: Secondary | ICD-10-CM | POA: Diagnosis not present

## 2017-11-24 DIAGNOSIS — L97211 Non-pressure chronic ulcer of right calf limited to breakdown of skin: Secondary | ICD-10-CM | POA: Diagnosis not present

## 2017-11-24 DIAGNOSIS — G8929 Other chronic pain: Secondary | ICD-10-CM | POA: Diagnosis not present

## 2017-11-24 DIAGNOSIS — I10 Essential (primary) hypertension: Secondary | ICD-10-CM | POA: Diagnosis not present

## 2017-11-24 MED ORDER — NALOXEGOL OXALATE 25 MG PO TABS: 25.0000 mg | ORAL_TABLET | Freq: Every day | ORAL | 3 refills | Status: DC

## 2017-11-24 NOTE — Telephone Encounter (Signed)
Requesting refill on Movantik - I do not see on list - if ok please send.

## 2017-11-25 ENCOUNTER — Institutional Professional Consult (permissible substitution): Payer: Medicare HMO | Admitting: Pulmonary Disease

## 2017-11-26 DIAGNOSIS — S81801D Unspecified open wound, right lower leg, subsequent encounter: Secondary | ICD-10-CM | POA: Diagnosis not present

## 2017-11-26 DIAGNOSIS — F4310 Post-traumatic stress disorder, unspecified: Secondary | ICD-10-CM | POA: Diagnosis not present

## 2017-11-26 DIAGNOSIS — E119 Type 2 diabetes mellitus without complications: Secondary | ICD-10-CM | POA: Diagnosis not present

## 2017-11-26 DIAGNOSIS — I10 Essential (primary) hypertension: Secondary | ICD-10-CM | POA: Diagnosis not present

## 2017-11-26 DIAGNOSIS — I872 Venous insufficiency (chronic) (peripheral): Secondary | ICD-10-CM | POA: Diagnosis not present

## 2017-11-26 DIAGNOSIS — M1990 Unspecified osteoarthritis, unspecified site: Secondary | ICD-10-CM | POA: Diagnosis not present

## 2017-11-26 DIAGNOSIS — G8929 Other chronic pain: Secondary | ICD-10-CM | POA: Diagnosis not present

## 2017-11-26 DIAGNOSIS — J449 Chronic obstructive pulmonary disease, unspecified: Secondary | ICD-10-CM | POA: Diagnosis not present

## 2017-11-26 DIAGNOSIS — L97211 Non-pressure chronic ulcer of right calf limited to breakdown of skin: Secondary | ICD-10-CM | POA: Diagnosis not present

## 2017-11-28 ENCOUNTER — Encounter: Payer: Self-pay | Admitting: Vascular Surgery

## 2017-11-28 ENCOUNTER — Ambulatory Visit (HOSPITAL_COMMUNITY)
Admission: RE | Admit: 2017-11-28 | Discharge: 2017-11-28 | Disposition: A | Discharge status: Home / Self Care | Payer: Non-veteran care | Source: Ambulatory Visit | Attending: Vascular Surgery | Admitting: Vascular Surgery

## 2017-11-28 ENCOUNTER — Ambulatory Visit: Payer: Medicare HMO | Admitting: Vascular Surgery

## 2017-11-28 ENCOUNTER — Other Ambulatory Visit: Payer: Self-pay

## 2017-11-28 VITALS — BP 142/75 | HR 55 | Temp 98.4°F | Resp 20 | Ht 70.0 in | Wt 250.0 lb

## 2017-11-28 DIAGNOSIS — I872 Venous insufficiency (chronic) (peripheral): Secondary | ICD-10-CM

## 2017-11-28 DIAGNOSIS — I739 Peripheral vascular disease, unspecified: Secondary | ICD-10-CM | POA: Diagnosis not present

## 2017-11-28 NOTE — Progress Notes (Signed)
Patient ID: Brad Taylor, male   DOB: May 26, 1951, 67 y.o.   MRN: [id number]  Reason for Consult: PAD (4 wk f/u bilat LE venous.)   Referred by Brad Frizzle, MD  Subjective:     HPI:  Brad Taylor is a 67 y.o. male with history of mixed arterial and venous disease with ulceration of his right ankle.  Since his right lower extremity arterial intervention he has had significant improvement in the ulceration.  He has been wearing compression therapy for 5 years now and seeing the wound clinic.  He now presents for venous evaluation which was performed last March but did not demonstrate any significant size veins for intervention.  Past Medical History:  Diagnosis Date  . AAA (abdominal aortic aneurysm) ([place]) 5/15   3.5x3.5 cm  . COPD (chronic obstructive pulmonary disease) ([place])   . Depression   . Diabetes mellitus without complication ([place])   . GERD (gastroesophageal reflux disease)   . Hyperlipidemia   . Hypothyroidism   . Post traumatic stress disorder   . Stroke ([place])   . Thyroid disease   . Ulcer    Family History  Problem Relation Age of Onset  . Emphysema Father   . Thyroid disease Sister   . Stroke Maternal Grandmother   . Thyroid disease Sister   . Thyroid disease Sister    Past Surgical History:  Procedure Laterality Date  . ABDOMINAL AORTOGRAM W/LOWER EXTREMITY N/A 08/20/2017   Procedure: ABDOMINAL AORTOGRAM W/LOWER EXTREMITY;  Surgeon: Waynetta Sandy, MD;  Location: [facility];  Service: Cardiovascular;  Laterality: N/A;  . CATARACT EXTRACTION    . CHOLECYSTECTOMY    . KNEE SURGERY    . LUNG REMOVAL, PARTIAL     right, 2 years ago at [facility] for cancer  . PERIPHERAL VASCULAR ATHERECTOMY Right 08/20/2017   Procedure: PERIPHERAL VASCULAR ATHERECTOMY;  Surgeon: Waynetta Sandy, MD;  Location: [place] [facility];  Service: Cardiovascular;  Laterality: Right;  posterior tibial and tibeoperoneal trunk  . PERIPHERAL VASCULAR BALLOON  ANGIOPLASTY Right 08/20/2017   Procedure: PERIPHERAL VASCULAR BALLOON ANGIOPLASTY;  Surgeon: Waynetta Sandy, MD;  Location: [place] [facility];  Service: Cardiovascular;  Laterality: Right;  Anterior tibial    Short Social History:  Social History   Tobacco Use  . Smoking status: Current Every Day Smoker    Packs/day: 1.00    Years: 30.00    Pack years: 30.00    Types: Cigarettes  . Smokeless tobacco: Former Systems developer    Quit date: 05/04/2013  Substance Use Topics  . Alcohol use: No    Comment: Former heavy drinker, been sober for 10 years    No Known Allergies  Current Outpatient Medications  Medication Sig Dispense Refill  . albuterol (PROVENTIL HFA;VENTOLIN HFA) 108 (90 Base) MCG/ACT inhaler INHALE 2 PUFFS INTO THE LUNGS EVERY 6 HOURS AS NEEDED FOR WHEEZE OR SHORTNESS OF BREATH 18 g 3  . atorvastatin (LIPITOR) 10 MG tablet Take 5 mg by mouth daily. Takes 5 mg     . busPIRone (BUSPAR) 10 MG tablet Take 0.5 tablets (5 mg total) by mouth 2 (two) times daily as needed (for anxiety). (Patient taking differently: Take 5 mg by mouth 4 (four) times daily. ) 60 tablet 0  . clopidogrel (PLAVIX) 75 MG tablet Take 1 tablet (75 mg total) by mouth daily with breakfast. 90 tablet 3  . gabapentin (NEURONTIN) 300 MG capsule Take 1 capsule (300 mg total) by mouth  3 (three) times daily. 90 capsule 3  . gabapentin (NEURONTIN) 300 MG capsule Take 1 capsule (300 mg total) by mouth 3 (three) times daily. 90 capsule 3  . insulin glargine (LANTUS) 100 UNIT/ML injection Inject 40 Units into the skin 2 (two) times daily. s    . Insulin Pen Needle (PEN NEEDLES 5/16") 30G X 8 MM MISC Use with insulin pen daily DX:E11.9 100 each 5  . levothyroxine (SYNTHROID, LEVOTHROID) 125 MCG tablet Take 1 tablet (125 mcg total) by mouth daily before breakfast. 30 tablet 1  . metFORMIN (GLUCOPHAGE) 500 MG tablet Take 500 mg by mouth 2 (two) times daily with a meal.    . morphine (MS CONTIN) 100 MG 12 hr tablet Take 100  mg by mouth every 12 (twelve) hours.    . Multiple Vitamins-Minerals (MULTIVITAMIN WITH MINERALS) tablet Take 1 tablet by mouth daily.    . naloxegol oxalate (MOVANTIK) 25 MG TABS tablet Take 1 tablet (25 mg total) by mouth daily. 30 tablet 3  . RABEprazole (ACIPHEX) 20 MG tablet Take 2 tablets (40 mg total) by mouth daily before breakfast. 30 tablet 1  . tamsulosin (FLOMAX) 0.4 MG CAPS capsule Take 0.4 mg by mouth daily.    Marland Kitchen testosterone cypionate (DEPOTESTOSTERONE CYPIONATE) 200 MG/ML injection Inject 1 mL (200 mg total) into the muscle every 14 (fourteen) days. 10 mL 0  . traZODone (DESYREL) 100 MG tablet Take 3 tablets (300 mg total) by mouth at bedtime. 30 tablet 0  . venlafaxine XR (EFFEXOR-XR) 150 MG 24 hr capsule Take 1 capsule (150 mg total) by mouth daily with breakfast. (Patient taking differently: 150 mg 2 (two) times daily. ) 30 capsule 1   No current facility-administered medications for this visit.     Review of Systems  Constitutional: Positive for chills.  HENT: HENT negative.  Eyes: Eyes negative.  Respiratory: Respiratory negative.  Cardiovascular: Cardiovascular negative.  Skin: Positive for wound.  Neurological: Neurological negative. Hematologic: Hematologic/lymphatic negative.  Psychiatric: Psychiatric negative.        Objective:  Objective   There were no vitals filed for this visit. There is no height or weight on file to calculate BMI.  Physical Exam  Constitutional: He appears well-developed.  HENT:  Head: Normocephalic.  Eyes: Pupils are equal, round, and reactive to light.  Neck: Normal range of motion.  Cardiovascular: Normal rate.  Pulses:      Femoral pulses are 2+ on the right side, and 2+ on the left side. Pulmonary/Chest: Effort normal.  Musculoskeletal: He exhibits edema.  Neurological: He is alert.  Skin: Skin is warm.  Hemosiderin deposits ble, compressive bandage to right leg  Psychiatric: He has a normal mood and affect. His  behavior is normal. Judgment and thought content normal.    Data: I have independently interpreted his venous reflux duplex which demonstrates on the right side reflux in the common femoral vein and is a small saphenous vein which is 0.5 cm in size.  The great saphenous vein at the saphenofemoral junction is 0.65 cm.  The left lower extremity has a great saphenous vein with reflux throughout and measures up to 0.5 cm.     Assessment/Plan:     67 year-old male with mixed venous and arterial wound on the right lateral leg.  Per the patient is improving after arterial intervention.  He now presents with venous reflux study which does demonstrate significant reflux and a small saphenous vein on that side and a size of 0.5 cm.  I will have him see 1 of our vein specialist for consideration of intervention he has bilateral lower extremities focusing on the right to prevent recurrent ulceration we can finally get this to heal.  I will see him in another 6 months with right lower extremity duplex and ABIs.  He will continue on Plavix and statin.  He demonstrates very good understanding we will get him scheduled today.     Waynetta Sandy MD [facility]

## 2017-12-01 DIAGNOSIS — I872 Venous insufficiency (chronic) (peripheral): Secondary | ICD-10-CM | POA: Diagnosis not present

## 2017-12-01 DIAGNOSIS — S81801D Unspecified open wound, right lower leg, subsequent encounter: Secondary | ICD-10-CM | POA: Diagnosis not present

## 2017-12-01 DIAGNOSIS — L97211 Non-pressure chronic ulcer of right calf limited to breakdown of skin: Secondary | ICD-10-CM | POA: Diagnosis not present

## 2017-12-01 DIAGNOSIS — I10 Essential (primary) hypertension: Secondary | ICD-10-CM | POA: Diagnosis not present

## 2017-12-01 DIAGNOSIS — G8929 Other chronic pain: Secondary | ICD-10-CM | POA: Diagnosis not present

## 2017-12-01 DIAGNOSIS — M1990 Unspecified osteoarthritis, unspecified site: Secondary | ICD-10-CM | POA: Diagnosis not present

## 2017-12-01 DIAGNOSIS — J449 Chronic obstructive pulmonary disease, unspecified: Secondary | ICD-10-CM | POA: Diagnosis not present

## 2017-12-01 DIAGNOSIS — F4310 Post-traumatic stress disorder, unspecified: Secondary | ICD-10-CM | POA: Diagnosis not present

## 2017-12-01 DIAGNOSIS — E119 Type 2 diabetes mellitus without complications: Secondary | ICD-10-CM | POA: Diagnosis not present

## 2017-12-03 ENCOUNTER — Inpatient Hospital Stay (HOSPITAL_COMMUNITY)
Admission: EM | Admit: 2017-12-03 | Discharge: 2017-12-06 | DRG: 638 | Disposition: A | Discharge status: Home / Self Care | Payer: Non-veteran care | Attending: Internal Medicine | Admitting: Internal Medicine

## 2017-12-03 ENCOUNTER — Encounter (HOSPITAL_BASED_OUTPATIENT_CLINIC_OR_DEPARTMENT_OTHER): Payer: Medicare HMO | Attending: Physician Assistant

## 2017-12-03 ENCOUNTER — Other Ambulatory Visit: Payer: Self-pay

## 2017-12-03 ENCOUNTER — Encounter (HOSPITAL_COMMUNITY): Payer: Self-pay | Admitting: *Deleted

## 2017-12-03 ENCOUNTER — Inpatient Hospital Stay (HOSPITAL_COMMUNITY): Payer: Non-veteran care

## 2017-12-03 ENCOUNTER — Emergency Department (HOSPITAL_COMMUNITY): Payer: Non-veteran care

## 2017-12-03 DIAGNOSIS — S81801D Unspecified open wound, right lower leg, subsequent encounter: Secondary | ICD-10-CM | POA: Diagnosis not present

## 2017-12-03 DIAGNOSIS — E039 Hypothyroidism, unspecified: Secondary | ICD-10-CM | POA: Diagnosis not present

## 2017-12-03 DIAGNOSIS — Z9049 Acquired absence of other specified parts of digestive tract: Secondary | ICD-10-CM

## 2017-12-03 DIAGNOSIS — Z8349 Family history of other endocrine, nutritional and metabolic diseases: Secondary | ICD-10-CM | POA: Diagnosis not present

## 2017-12-03 DIAGNOSIS — Z7901 Long term (current) use of anticoagulants: Secondary | ICD-10-CM | POA: Diagnosis not present

## 2017-12-03 DIAGNOSIS — L03116 Cellulitis of left lower limb: Secondary | ICD-10-CM | POA: Diagnosis present

## 2017-12-03 DIAGNOSIS — M86161 Other acute osteomyelitis, right tibia and fibula: Secondary | ICD-10-CM | POA: Diagnosis not present

## 2017-12-03 DIAGNOSIS — IMO0002 Reserved for concepts with insufficient information to code with codable children: Secondary | ICD-10-CM

## 2017-12-03 DIAGNOSIS — Z79890 Hormone replacement therapy (postmenopausal): Secondary | ICD-10-CM | POA: Diagnosis not present

## 2017-12-03 DIAGNOSIS — M869 Osteomyelitis, unspecified: Secondary | ICD-10-CM | POA: Diagnosis present

## 2017-12-03 DIAGNOSIS — Z794 Long term (current) use of insulin: Secondary | ICD-10-CM

## 2017-12-03 DIAGNOSIS — F17210 Nicotine dependence, cigarettes, uncomplicated: Secondary | ICD-10-CM | POA: Diagnosis present

## 2017-12-03 DIAGNOSIS — L97919 Non-pressure chronic ulcer of unspecified part of right lower leg with unspecified severity: Secondary | ICD-10-CM | POA: Diagnosis not present

## 2017-12-03 DIAGNOSIS — I5032 Chronic diastolic (congestive) heart failure: Secondary | ICD-10-CM | POA: Diagnosis present

## 2017-12-03 DIAGNOSIS — L97922 Non-pressure chronic ulcer of unspecified part of left lower leg with fat layer exposed: Secondary | ICD-10-CM | POA: Diagnosis not present

## 2017-12-03 DIAGNOSIS — Z8673 Personal history of transient ischemic attack (TIA), and cerebral infarction without residual deficits: Secondary | ICD-10-CM

## 2017-12-03 DIAGNOSIS — E785 Hyperlipidemia, unspecified: Secondary | ICD-10-CM | POA: Diagnosis present

## 2017-12-03 DIAGNOSIS — F329 Major depressive disorder, single episode, unspecified: Secondary | ICD-10-CM | POA: Diagnosis present

## 2017-12-03 DIAGNOSIS — I69320 Aphasia following cerebral infarction: Secondary | ICD-10-CM | POA: Insufficient documentation

## 2017-12-03 DIAGNOSIS — E11622 Type 2 diabetes mellitus with other skin ulcer: Principal | ICD-10-CM | POA: Diagnosis present

## 2017-12-03 DIAGNOSIS — L97929 Non-pressure chronic ulcer of unspecified part of left lower leg with unspecified severity: Secondary | ICD-10-CM | POA: Diagnosis present

## 2017-12-03 DIAGNOSIS — L97812 Non-pressure chronic ulcer of other part of right lower leg with fat layer exposed: Secondary | ICD-10-CM | POA: Insufficient documentation

## 2017-12-03 DIAGNOSIS — L03115 Cellulitis of right lower limb: Secondary | ICD-10-CM | POA: Diagnosis present

## 2017-12-03 DIAGNOSIS — K219 Gastro-esophageal reflux disease without esophagitis: Secondary | ICD-10-CM | POA: Diagnosis present

## 2017-12-03 DIAGNOSIS — E1165 Type 2 diabetes mellitus with hyperglycemia: Secondary | ICD-10-CM | POA: Diagnosis not present

## 2017-12-03 DIAGNOSIS — Z7982 Long term (current) use of aspirin: Secondary | ICD-10-CM | POA: Diagnosis not present

## 2017-12-03 DIAGNOSIS — Z720 Tobacco use: Secondary | ICD-10-CM | POA: Diagnosis not present

## 2017-12-03 DIAGNOSIS — E1140 Type 2 diabetes mellitus with diabetic neuropathy, unspecified: Secondary | ICD-10-CM

## 2017-12-03 DIAGNOSIS — J449 Chronic obstructive pulmonary disease, unspecified: Secondary | ICD-10-CM | POA: Diagnosis present

## 2017-12-03 DIAGNOSIS — Z9849 Cataract extraction status, unspecified eye: Secondary | ICD-10-CM

## 2017-12-03 DIAGNOSIS — I69959 Hemiplegia and hemiparesis following unspecified cerebrovascular disease affecting unspecified side: Secondary | ICD-10-CM | POA: Insufficient documentation

## 2017-12-03 DIAGNOSIS — Z7902 Long term (current) use of antithrombotics/antiplatelets: Secondary | ICD-10-CM | POA: Diagnosis not present

## 2017-12-03 DIAGNOSIS — F4310 Post-traumatic stress disorder, unspecified: Secondary | ICD-10-CM | POA: Diagnosis present

## 2017-12-03 DIAGNOSIS — B965 Pseudomonas (aeruginosa) (mallei) (pseudomallei) as the cause of diseases classified elsewhere: Secondary | ICD-10-CM | POA: Diagnosis not present

## 2017-12-03 DIAGNOSIS — Z825 Family history of asthma and other chronic lower respiratory diseases: Secondary | ICD-10-CM | POA: Diagnosis not present

## 2017-12-03 DIAGNOSIS — Z823 Family history of stroke: Secondary | ICD-10-CM | POA: Diagnosis not present

## 2017-12-03 DIAGNOSIS — E1169 Type 2 diabetes mellitus with other specified complication: Secondary | ICD-10-CM | POA: Diagnosis present

## 2017-12-03 DIAGNOSIS — I714 Abdominal aortic aneurysm, without rupture: Secondary | ICD-10-CM | POA: Diagnosis present

## 2017-12-03 DIAGNOSIS — Z85118 Personal history of other malignant neoplasm of bronchus and lung: Secondary | ICD-10-CM

## 2017-12-03 DIAGNOSIS — Z902 Acquired absence of lung [part of]: Secondary | ICD-10-CM

## 2017-12-03 DIAGNOSIS — L97909 Non-pressure chronic ulcer of unspecified part of unspecified lower leg with unspecified severity: Secondary | ICD-10-CM | POA: Diagnosis not present

## 2017-12-03 LAB — BASIC METABOLIC PANEL
Anion gap: 10 (ref 5–15)
BUN: 12 mg/dL (ref 6–20)
CHLORIDE: 99 mmol/L — AB (ref 101–111)
CO2: 28 mmol/L (ref 22–32)
CREATININE: 0.75 mg/dL (ref 0.61–1.24)
Calcium: 9 mg/dL (ref 8.9–10.3)
GFR calc Af Amer: >60 mL/min (ref >60)
GFR calc non Af Amer: >60 mL/min (ref >60)
Glucose, Bld: 197 mg/dL — ABNORMAL HIGH (ref 65–99)
POTASSIUM: 4.7 mmol/L (ref 3.5–5.1)
Sodium: 137 mmol/L (ref 135–145)

## 2017-12-03 LAB — CBC
HCT: 45.4 % (ref 39.0–52.0)
Hemoglobin: 14.7 g/dL (ref 13.0–17.0)
MCH: 30 pg (ref 26.0–34.0)
MCHC: 32.4 g/dL (ref 30.0–36.0)
MCV: 92.7 fL (ref 78.0–100.0)
PLATELETS: 231 10*3/uL (ref 150–400)
RBC: 4.9 MIL/uL (ref 4.22–5.81)
RDW: 13.8 % (ref 11.5–15.5)
WBC: 10.8 10*3/uL — ABNORMAL HIGH (ref 4.0–10.5)

## 2017-12-03 LAB — GLUCOSE, CAPILLARY
GLUCOSE-CAPILLARY: 127 mg/dL — AB (ref 65–99)
GLUCOSE-CAPILLARY: 170 mg/dL — AB (ref 65–99)
Glucose-Capillary: 160 mg/dL — ABNORMAL HIGH (ref 65–99)

## 2017-12-03 LAB — HEMOGLOBIN A1C
HEMOGLOBIN A1C: 7.3 % — AB (ref 4.8–5.6)
MEAN PLASMA GLUCOSE: 162.81 mg/dL

## 2017-12-03 LAB — CBG MONITORING, ED: Glucose-Capillary: 121 mg/dL — ABNORMAL HIGH (ref 65–99)

## 2017-12-03 LAB — BRAIN NATRIURETIC PEPTIDE: B Natriuretic Peptide: 61.9 pg/mL (ref 0.0–100.0)

## 2017-12-03 MED ORDER — INSULIN ASPART 100 UNIT/ML ~~LOC~~ SOLN
0.0000 [IU] | Freq: Every day | SUBCUTANEOUS | Status: DC
  Administered 2017-12-04: 2 [IU] via SUBCUTANEOUS

## 2017-12-03 MED ORDER — SODIUM CHLORIDE 0.9% FLUSH: 3.0000 mL | INTRAVENOUS | Status: DC | PRN

## 2017-12-03 MED ORDER — ENOXAPARIN SODIUM 60 MG/0.6ML ~~LOC~~ SOLN
0.5000 mg/kg | SUBCUTANEOUS | Status: DC
  Administered 2017-12-03 – 2017-12-05 (×3): 55 mg via SUBCUTANEOUS
  Filled 2017-12-03 (×2): qty 0.6
  Filled 2017-12-03: qty 0.55

## 2017-12-03 MED ORDER — NALOXEGOL OXALATE 25 MG PO TABS
25.0000 mg | ORAL_TABLET | Freq: Every day | ORAL | Status: DC
  Administered 2017-12-04 – 2017-12-06 (×3): 25 mg via ORAL
  Filled 2017-12-03 (×3): qty 1

## 2017-12-03 MED ORDER — PIPERACILLIN-TAZOBACTAM 3.375 G IVPB 30 MIN
3.3750 g | Freq: Once | INTRAVENOUS | Status: AC
  Administered 2017-12-03: 3.375 g via INTRAVENOUS
  Filled 2017-12-03: qty 50

## 2017-12-03 MED ORDER — LEVOTHYROXINE SODIUM 125 MCG PO TABS
125.0000 ug | ORAL_TABLET | Freq: Every day | ORAL | Status: DC
  Administered 2017-12-04 – 2017-12-06 (×3): 125 ug via ORAL
  Filled 2017-12-03 (×3): qty 1

## 2017-12-03 MED ORDER — CLOPIDOGREL BISULFATE 75 MG PO TABS
75.0000 mg | ORAL_TABLET | Freq: Every day | ORAL | Status: DC
  Administered 2017-12-04 – 2017-12-06 (×3): 75 mg via ORAL
  Filled 2017-12-03 (×3): qty 1

## 2017-12-03 MED ORDER — OXYCODONE HCL 5 MG PO TABS
5.0000 mg | ORAL_TABLET | ORAL | Status: DC | PRN
  Administered 2017-12-03: 5 mg via ORAL
  Filled 2017-12-03: qty 1

## 2017-12-03 MED ORDER — GABAPENTIN 300 MG PO CAPS
300.0000 mg | ORAL_CAPSULE | Freq: Three times a day (TID) | ORAL | Status: DC
  Administered 2017-12-03 – 2017-12-06 (×9): 300 mg via ORAL
  Filled 2017-12-03 (×9): qty 1

## 2017-12-03 MED ORDER — GADOBENATE DIMEGLUMINE 529 MG/ML IV SOLN: 20.0000 mL | Freq: Once | INTRAVENOUS | Status: DC | PRN

## 2017-12-03 MED ORDER — INSULIN ASPART 100 UNIT/ML ~~LOC~~ SOLN
0.0000 [IU] | Freq: Three times a day (TID) | SUBCUTANEOUS | Status: DC
  Administered 2017-12-03: 3 [IU] via SUBCUTANEOUS
  Administered 2017-12-03: 2 [IU] via SUBCUTANEOUS
  Administered 2017-12-04: 3 [IU] via SUBCUTANEOUS
  Administered 2017-12-04: 2 [IU] via SUBCUTANEOUS
  Administered 2017-12-05: 3 [IU] via SUBCUTANEOUS

## 2017-12-03 MED ORDER — NICOTINE 7 MG/24HR TD PT24
7.0000 mg | MEDICATED_PATCH | Freq: Every day | TRANSDERMAL | Status: DC
  Administered 2017-12-03 – 2017-12-04 (×2): 7 mg via TRANSDERMAL
  Filled 2017-12-03 (×4): qty 1

## 2017-12-03 MED ORDER — ACETAMINOPHEN 650 MG RE SUPP: 650.0000 mg | Freq: Four times a day (QID) | RECTAL | Status: DC | PRN

## 2017-12-03 MED ORDER — LORAZEPAM 2 MG/ML IJ SOLN
0.5000 mg | Freq: Once | INTRAMUSCULAR | Status: AC
  Administered 2017-12-03: 0.5 mg via INTRAVENOUS
  Filled 2017-12-03: qty 1

## 2017-12-03 MED ORDER — INSULIN GLARGINE 100 UNIT/ML ~~LOC~~ SOLN
30.0000 [IU] | Freq: Two times a day (BID) | SUBCUTANEOUS | Status: DC
Start: 2017-12-03 — End: 2017-12-06
  Administered 2017-12-03 – 2017-12-06 (×7): 30 [IU] via SUBCUTANEOUS
  Filled 2017-12-03 (×8): qty 0.3

## 2017-12-03 MED ORDER — VANCOMYCIN HCL 10 G IV SOLR
2500.0000 mg | INTRAVENOUS | Status: AC
  Administered 2017-12-03: 2500 mg via INTRAVENOUS
  Filled 2017-12-03: qty 2000

## 2017-12-03 MED ORDER — PIPERACILLIN-TAZOBACTAM 3.375 G IVPB
3.3750 g | Freq: Three times a day (TID) | INTRAVENOUS | Status: DC
  Administered 2017-12-03 – 2017-12-06 (×8): 3.375 g via INTRAVENOUS
  Filled 2017-12-03 (×10): qty 50

## 2017-12-03 MED ORDER — SODIUM CHLORIDE 0.9 % IV SOLN: 250.0000 mL | INTRAVENOUS | Status: DC | PRN

## 2017-12-03 MED ORDER — TRAZODONE HCL 100 MG PO TABS
300.0000 mg | ORAL_TABLET | Freq: Every day | ORAL | Status: DC
  Administered 2017-12-03 – 2017-12-05 (×3): 300 mg via ORAL
  Filled 2017-12-03 (×3): qty 3

## 2017-12-03 MED ORDER — ACETAMINOPHEN 325 MG PO TABS: 650.0000 mg | ORAL_TABLET | Freq: Four times a day (QID) | ORAL | Status: DC | PRN

## 2017-12-03 MED ORDER — TAMSULOSIN HCL 0.4 MG PO CAPS
0.4000 mg | ORAL_CAPSULE | Freq: Every day | ORAL | Status: DC
  Administered 2017-12-03 – 2017-12-05 (×3): 0.4 mg via ORAL
  Filled 2017-12-03 (×3): qty 1

## 2017-12-03 MED ORDER — MORPHINE SULFATE (PF) 4 MG/ML IV SOLN
2.0000 mg | INTRAVENOUS | Status: DC | PRN
  Administered 2017-12-03 – 2017-12-04 (×3): 2 mg via INTRAVENOUS
  Filled 2017-12-03 (×3): qty 1

## 2017-12-03 MED ORDER — ATORVASTATIN CALCIUM 10 MG PO TABS
5.0000 mg | ORAL_TABLET | Freq: Every day | ORAL | Status: DC
  Administered 2017-12-03 – 2017-12-05 (×3): 5 mg via ORAL
  Filled 2017-12-03 (×3): qty 1

## 2017-12-03 MED ORDER — VENLAFAXINE HCL ER 75 MG PO CP24
150.0000 mg | ORAL_CAPSULE | Freq: Every day | ORAL | Status: DC
  Administered 2017-12-04 – 2017-12-06 (×3): 150 mg via ORAL
  Filled 2017-12-03 (×3): qty 2

## 2017-12-03 MED ORDER — PANTOPRAZOLE SODIUM 40 MG PO TBEC
40.0000 mg | DELAYED_RELEASE_TABLET | Freq: Every day | ORAL | Status: DC
  Administered 2017-12-04 – 2017-12-06 (×3): 40 mg via ORAL
  Filled 2017-12-03 (×3): qty 1

## 2017-12-03 MED ORDER — BUSPIRONE HCL 5 MG PO TABS
5.0000 mg | ORAL_TABLET | Freq: Four times a day (QID) | ORAL | Status: DC
  Administered 2017-12-03 – 2017-12-06 (×10): 5 mg via ORAL
  Filled 2017-12-03 (×10): qty 1

## 2017-12-03 MED ORDER — MORPHINE SULFATE ER 100 MG PO TBCR
100.0000 mg | EXTENDED_RELEASE_TABLET | Freq: Two times a day (BID) | ORAL | Status: DC
  Administered 2017-12-03 – 2017-12-06 (×6): 100 mg via ORAL
  Filled 2017-12-03 (×6): qty 1

## 2017-12-03 MED ORDER — SODIUM CHLORIDE 0.9% FLUSH
3.0000 mL | Freq: Two times a day (BID) | INTRAVENOUS | Status: DC
  Administered 2017-12-03 – 2017-12-04 (×3): 3 mL via INTRAVENOUS

## 2017-12-03 NOTE — Progress Notes (Signed)
Pharmacy Antibiotic Note  Brad Taylor is a 67 y.o. male admitted on 12/03/2017 from wound center for IV abx given lower extremity erythema worsening despite omnicef and septra which was initiated on 11/19/2017. RLE chronic wound has been present for over a year. Follows with vascular surgery. No fevers or chills. persistant pain and erythema. Symptoms are moderate in severity.    Pharmacy has been consulted for zosyn dosing for cellulitis.  Plan: Zosyn 3.375g IV Q8H infused over 4hrs. Pharmacy will sign off and follow peripherally     Temp (24hrs), Avg:97.8 F (36.6 C), Min:97.8 F (36.6 C), Max:97.8 F (36.6 C)  Recent Labs  Lab 12/03/17 1007  WBC 10.8*  CREATININE 0.75    Estimated Creatinine Clearance: 111.5 mL/min (by C-G formula based on SCr of 0.75 mg/dL).    No Known Allergies  Antimicrobials this admission: 5/1 vanc x 1 5/1 zosyn >> Dose adjustments this admission:   Microbiology results: 5/1 BCx:   Thank you for allowing pharmacy to be a part of this patient's care.  Dolly Rias RPh 12/03/2017, 12:19 PM Pager [phone]

## 2017-12-03 NOTE — H&P (Signed)
History and Physical  Brad Taylor:[id number] DOB: 1951-02-04 DOA: 12/03/2017  Referring physician: Jola Schmidt, ER physician PCP: [name], MD  Outpatient Specialists: Wound Care Patient coming from: Home & is able to ambulate with use of a cane  Chief Complaint: Leg pain  HPI: Brad Taylor is a 67 y.o. male with medical history significant for poorly controlled diabetes with secondary neuropathy, history of CVA and chronic left leg ulcer who was started on p.o. antibiotics about a week ago for cellulitis surrounding this ulcer.  When patient followed up today, erythema persistent with no improvement and continued pain so he was sent over to the emergency room.  Patient's pain in that leg, specifically the area surrounding the ulcer has become painful in the past week without any improvement, even after starting antibiotics.  ED Course: In the emergency room, lab work unremarkable.  White count normal and no fever.  Patient underwent a right tib-fib x-ray which noted radiographic evidence of acute versus subacute osteomyelitis in the distal right fibular shaft given a dose of IV vancomycin and Zosyn and hospitalists were called for further evaluation.  Review of Systems: Patient seen in the emergency room. Pt complains of continued pain in that left leg.  It is most painful at the area surrounding the ulcer as well as the ulcer itself.  Does not radiate elsewhere.  He also complains of chronic constipation from opiates requiring medication to move his bowels.  He last had a bowel movement yesterday.  Patient can complains of chronic lower extremity numbness and tingling in his feet.  He has some generalized weakness on his left side following his stroke.  Pt denies any headaches, vision changes, dysphagia, chest pain, palpitations, shortness of breath, wheeze, cough, abdominal pain, hematuria, dysuria, diarrhea,.  Review of systems are otherwise negative   Past Medical  History:  Diagnosis Date  . AAA (abdominal aortic aneurysm) ([place]) 5/15   3.5x3.5 cm  . COPD (chronic obstructive pulmonary disease) ([place])   . Depression   . Diabetes mellitus without complication ([place])   . GERD (gastroesophageal reflux disease)   . Hyperlipidemia   . Hypothyroidism   . Post traumatic stress disorder   . Stroke ([place])   . Thyroid disease   . Ulcer    Past Surgical History:  Procedure Laterality Date  . ABDOMINAL AORTOGRAM W/LOWER EXTREMITY N/A 08/20/2017   Procedure: ABDOMINAL AORTOGRAM W/LOWER EXTREMITY;  Surgeon: Waynetta Sandy, MD;  Location: [place] [facility];  Service: Cardiovascular;  Laterality: N/A;  . CATARACT EXTRACTION    . CHOLECYSTECTOMY    . KNEE SURGERY    . LUNG REMOVAL, PARTIAL     right, 2 years ago at [facility] for cancer  . PERIPHERAL VASCULAR ATHERECTOMY Right 08/20/2017   Procedure: PERIPHERAL VASCULAR ATHERECTOMY;  Surgeon: Waynetta Sandy, MD;  Location: [facility] [facility];  Service: Cardiovascular;  Laterality: Right;  posterior tibial and tibeoperoneal trunk  . PERIPHERAL VASCULAR BALLOON ANGIOPLASTY Right 08/20/2017   Procedure: PERIPHERAL VASCULAR BALLOON ANGIOPLASTY;  Surgeon: Waynetta Sandy, MD;  Location: [place] [facility];  Service: Cardiovascular;  Laterality: Right;  Anterior tibial    Social History:  reports that he has been smoking cigarettes.  He has a 30.00 pack-year smoking history. He quit smokeless tobacco use about 4 years ago. He reports that he does not drink alcohol or use drugs.  Patient states he smokes about 1/4 pack of cigarettes a day.  Lives at home with his  wife.  Ambulates using a cane   No Known Allergies  Family History  Problem Relation Age of Onset  . Emphysema Father   . Thyroid disease Sister   . Stroke Maternal Grandmother   . Thyroid disease Sister   . Thyroid disease Sister       Prior to Admission medications   Medication Sig Start Date End Date Taking? Authorizing  Provider  albuterol (PROVENTIL HFA;VENTOLIN HFA) 108 (90 Base) MCG/ACT inhaler INHALE 2 PUFFS INTO THE LUNGS EVERY 6 HOURS AS NEEDED FOR WHEEZE OR SHORTNESS OF BREATH 08/18/17  Yes [name], MD  atorvastatin (LIPITOR) 10 MG tablet Take 5 mg by mouth at bedtime. Takes 5 mg    Yes [provider]  busPIRone (BUSPAR) 10 MG tablet Take 0.5 tablets (5 mg total) by mouth 2 (two) times daily as needed (for anxiety). Patient taking differently: Take 10 mg by mouth 2 (two) times daily.  10/08/13  Yes Angiulli, Lavon Paganini, PA-C  cefdinir (OMNICEF) 300 MG capsule Take 300 mg by mouth 2 (two) times daily. 11/26/17  Yes [provider]  clopidogrel (PLAVIX) 75 MG tablet Take 1 tablet (75 mg total) by mouth daily with breakfast. 02/01/14  Yes [name], NP  gabapentin (NEURONTIN) 300 MG capsule Take 1 capsule (300 mg total) by mouth 3 (three) times daily. 11/07/17  Yes [name], MD  insulin glargine (LANTUS) 100 UNIT/ML injection Inject 40 Units into the skin 2 (two) times daily. s 10/08/13  Yes Angiulli, Lavon Paganini, PA-C  levothyroxine (SYNTHROID, LEVOTHROID) 125 MCG tablet Take 1 tablet (125 mcg total) by mouth daily before breakfast. 10/08/13  Yes Angiulli, Lavon Paganini, PA-C  metFORMIN (GLUCOPHAGE) 500 MG tablet Take 500 mg by mouth 2 (two) times daily with a meal.   Yes [provider]  morphine (MS CONTIN) 100 MG 12 hr tablet Take 100 mg by mouth every 12 (twelve) hours as needed for pain.    Yes [provider]  naloxegol oxalate (MOVANTIK) 25 MG TABS tablet Take 1 tablet (25 mg total) by mouth daily. 11/24/17  Yes [name], MD  RABEprazole (ACIPHEX) 20 MG tablet Take 2 tablets (40 mg total) by mouth daily before breakfast. 10/08/13  Yes Angiulli, Lavon Paganini, PA-C  tamsulosin (FLOMAX) 0.4 MG CAPS capsule Take 0.4 mg by mouth at bedtime.    Yes [provider]  testosterone cypionate (DEPOTESTOSTERONE CYPIONATE) 200 MG/ML injection Inject 1 mL (200 mg total)  into the muscle every 14 (fourteen) days. 09/19/17  Yes [name], MD  traZODone (DESYREL) 100 MG tablet Take 3 tablets (300 mg total) by mouth at bedtime. 10/08/13  Yes Angiulli, Lavon Paganini, PA-C  venlafaxine XR (EFFEXOR-XR) 150 MG 24 hr capsule Take 1 capsule (150 mg total) by mouth daily with breakfast. Patient taking differently: 150 mg 2 (two) times daily.  10/08/13  Yes Angiulli, Lavon Paganini, PA-C  Insulin Pen Needle (PEN NEEDLES 5/16") 30G X 8 MM MISC Use with insulin pen daily DX:E11.9 10/21/16   [name], MD    Physical Exam: BP 131/71   Pulse 81   Temp 97.8 F (36.6 C) (Oral)   Resp 20   SpO2 94%   General: Alert and oriented x3, no acute distress Eyes: Sclera nonicteric, extraocular movements are intact ENT: Normocephalic and atraumatic, mucous memories are slightly dry Neck: Supple, no JVD Cardiovascular: Regular rate and rhythm, S1-S2 Respiratory: Clear to auscultation bilaterally Abdomen: Soft, nontender, mildly distended, positive bowel  sounds Skin: Patient has a large approximately 9 inch ulceration on the anterior surface of his right leg extending down to the fat layer.  No active discharge.  The borders surrounding this are erythematous and tender for about 1/2 inch width Musculoskeletal: No clubbing or cyanosis, trace pitting edema Psychiatric: Appropriate, no evidence of psychoses Neurologic: Chronic neuropathy.  Patient has some mild weakness on his left side both upper and lower extremity following CVA          Labs on Admission:  Basic Metabolic Panel: Recent Labs  Lab 12/03/17 1007  NA 137  K 4.7  CL 99*  CO2 28  GLUCOSE 197*  BUN 12  CREATININE 0.75  CALCIUM 9.0   Liver Function Tests: No results for input(s): AST, ALT, ALKPHOS, BILITOT, PROT, ALBUMIN in the last 168 hours. No results for input(s): LIPASE, AMYLASE in the last 168 hours. No results for input(s): AMMONIA in the last 168 hours. CBC: Recent Labs  Lab 12/03/17 1007  WBC  10.8*  HGB 14.7  HCT 45.4  MCV 92.7  PLT 231   Cardiac Enzymes: No results for input(s): CKTOTAL, CKMB, CKMBINDEX, TROPONINI in the last 168 hours.  BNP (last 3 results) No results for input(s): BNP in the last 8760 hours.  ProBNP (last 3 results) No results for input(s): PROBNP in the last 8760 hours.  CBG: Recent Labs  Lab 12/03/17 1215  GLUCAP 121*    Radiological Exams on Admission: Dg Tibia/fibula Right  Result Date: 12/03/2017 CLINICAL DATA:  Lateral distal right lower extremity chronic ulcer. Clinical concern for osteomyelitis. EXAM: RIGHT TIBIA AND FIBULA - 2 VIEW COMPARISON:  None. FINDINGS: Large soft tissue ulcer is noted anterolateral to the distal right fibular shaft. There are tiny cortical erosions along the lateral distal right fibular shaft. There is associated irregular mild periosteal reaction throughout the distal right fibular shaft. Mild smooth periosteal reaction is present in the mid right femoral shaft laterally. No suspicious focal osseous lesions. No fractures. No radiopaque foreign body. No appreciable soft tissue gas. IMPRESSION: Large soft tissue ulcer anterolateral to the distal right fibular shaft. Radiographic evidence of acute/subacute osteomyelitis in the distal right fibular shaft including tiny cortical erosions and irregular periosteal reaction. Smooth periosteal reaction more proximally in the mid right femoral shaft is nonspecific with osteomyelitis not excluded. Electronically Signed   By: Ilona Sorrel M.D.   On: 12/03/2017 10:43    EKG: Not done  Assessment/Plan Present on Admission: . Osteomyelitis of femur ([place]) . Tobacco abuse . Hypothyroidism . Chronic ulcer of left leg ([place]) . Cellulitis of left leg  Principal Problem:   Osteomyelitis of femur ([facility]) with cellulitis and secondary chronic ulcer of left leg: Given failure of outpatient  antibiotics, will start with IV vancomycin and Zosyn.  Check MRI to confirm findings on x-ray and if  indeed osteomyelitis acute, will consult orthopedics.  Continue chronic pain medications plus as needed OxyIR for breakthrough pain Active Problems:   Tobacco abuse: Nicotine patch   Uncontrolled type 2 diabetes mellitus with diabetic neuropathy, with long-term current use of insulin ([place]): Check A1c.  Sliding scale plus IV Lantus (30 units twice daily instead of patient's home dose of 40 twice daily)   Hypothyroidism: Continue Synthroid  History of CVA: Continue aspirin and Plavix Chronic diastolic heart failure: In review of an echocardiogram done approximately 4 years ago, there is a comment about abnormal diastolic relaxation.  Will check BNP and follow input/output, daily weights    DVT  prophylaxis: Lovenox  Code Status: Full code as per patient  Family Communication: Wife at the bedside  Disposition Plan: Likely will be here for several days on IV antibiotics, even if no osteomyelitis  Consults called: Orthopedics if MRI positive  Admission status: Given patient's need for being here past 2 midnights requiring acute hospital services, admit as inpatient    Annita Brod MD [facility] Pager [phone]  If 7PM-7AM, please contact night-coverage [email or web address] Password [email or web address]  12/03/2017, 12:49 PM

## 2017-12-03 NOTE — ED Triage Notes (Signed)
Pt sent from wound center for IV abx. Pt has ulcer to right lower lateral leg for the past year that has not been healing.Pt has been taking oral abx w/o improvement.

## 2017-12-03 NOTE — ED Notes (Signed)
ED TO INPATIENT HANDOFF REPORT  Name/Age/Gender Brad Taylor 67 y.o. male  Code Status    Code Status Orders  (From admission, onward)        Start     Ordered   12/03/17 1136  Full code  Continuous     12/03/17 1137    Code Status History    Date Active Date Inactive Code Status Order ID Comments User Context   08/20/2017 1056 08/20/2017 1636 Full Code [id number]  Waynetta Sandy, MD Inpatient   09/20/2013 1658 10/08/2013 1458 Full Code [id number]  [name], PA-C Inpatient   09/20/2013 1658 09/20/2013 1658 Full Code [id number]  [name], PA-C Inpatient   09/15/2013 2204 09/20/2013 1658 Full Code [id number]  Coral Spikes, DO Inpatient   09/15/2013 2105 09/15/2013 2204 Full Code [id number]  Coral Spikes, DO ED      Home/SNF/Other Home  Chief Complaint wound check   Level of Care/Admitting Diagnosis ED Disposition    ED Disposition Condition [facility] Area: [facility] [[id number]]  Level of Care: Med-Surg [16]  Diagnosis: Osteomyelitis of femur [facility]) [500938]  Admitting Physician: Junius Argyle  Attending Physician: Junius Argyle  Estimated length of stay: past midnight tomorrow  Certification:: I certify this patient will need inpatient services for at least 2 midnights  PT Class (Do Not Modify): Inpatient [101]  PT Acc Code (Do Not Modify): Private [1]       Medical History Past Medical History:  Diagnosis Date  . AAA (abdominal aortic aneurysm) ([place]) 5/15   3.5x3.5 cm  . COPD (chronic obstructive pulmonary disease) ([place])   . Depression   . Diabetes mellitus without complication ([place])   . GERD (gastroesophageal reflux disease)   . Hyperlipidemia   . Hypothyroidism   . Post traumatic stress disorder   . Stroke ([place])   . Thyroid disease   . Ulcer     Allergies No Known Allergies  IV Location/Drains/Wounds Patient Lines/Drains/Airways Status   Active Line/Drains/Airways     Name:   Placement date:   Placement time:   Site:   Days:   Peripheral IV 12/03/17 Right Wrist   12/03/17    1030    Wrist   less than 1   Wound / Incision (Open or Dehisced) 09/20/13 Diabetic ulcer Leg Left;Lateral   09/20/13    1700    Leg   1535          Labs/Imaging Results for orders placed or performed during the hospital encounter of 12/03/17 (from the past 48 hour(s))  CBC     Status: Abnormal   Collection Time: 12/03/17 10:07 AM  Result Value Ref Range   WBC 10.8 (H) 4.0 - 10.5 K/uL   RBC 4.90 4.22 - 5.81 MIL/uL   Hemoglobin 14.7 13.0 - 17.0 g/dL   HCT 45.4 39.0 - 52.0 %   MCV 92.7 78.0 - 100.0 fL   MCH 30.0 26.0 - 34.0 pg   MCHC 32.4 30.0 - 36.0 g/dL   RDW 13.8 11.5 - 15.5 %   Platelets 231 150 - 400 K/uL    Comment: Performed at [facility], [place] [address], [place], [place] [address]  Basic metabolic panel     Status: Abnormal   Collection Time: 12/03/17 10:07 AM  Result Value Ref Range   Sodium 137 135 - 145 mmol/L   Potassium 4.7 3.5 - 5.1 mmol/L  Chloride 99 (L) 101 - 111 mmol/L   CO2 28 22 - 32 mmol/L   Glucose, Bld 197 (H) 65 - 99 mg/dL   BUN 12 6 - 20 mg/dL   Creatinine, Ser 0.75 0.61 - 1.24 mg/dL   Calcium 9.0 8.9 - 10.3 mg/dL   GFR calc non Af Amer >60 >60 mL/min   GFR calc Af Amer >60 >60 mL/min    Comment: (NOTE) The eGFR has been calculated using the CKD EPI equation. This calculation has not been validated in all clinical situations. eGFR's persistently <60 mL/min signify possible Chronic Kidney Disease.    Anion gap 10 5 - 15    Comment: Performed at [facility], [place] [address], [place], [place] [address]  CBG monitoring, ED     Status: Abnormal   Collection Time: 12/03/17 12:15 PM  Result Value Ref Range   Glucose-Capillary 121 (H) 65 - 99 mg/dL   Dg Tibia/fibula Right  Result Date: 12/03/2017 CLINICAL DATA:  Lateral distal right lower extremity chronic ulcer. Clinical concern for osteomyelitis. EXAM:  RIGHT TIBIA AND FIBULA - 2 VIEW COMPARISON:  None. FINDINGS: Large soft tissue ulcer is noted anterolateral to the distal right fibular shaft. There are tiny cortical erosions along the lateral distal right fibular shaft. There is associated irregular mild periosteal reaction throughout the distal right fibular shaft. Mild smooth periosteal reaction is present in the mid right femoral shaft laterally. No suspicious focal osseous lesions. No fractures. No radiopaque foreign body. No appreciable soft tissue gas. IMPRESSION: Large soft tissue ulcer anterolateral to the distal right fibular shaft. Radiographic evidence of acute/subacute osteomyelitis in the distal right fibular shaft including tiny cortical erosions and irregular periosteal reaction. Smooth periosteal reaction more proximally in the mid right femoral shaft is nonspecific with osteomyelitis not excluded. Electronically Signed   By: Ilona Sorrel M.D.   On: 12/03/2017 10:43    Pending Labs Unresulted Labs (From admission, onward)   Start     Ordered   12/10/17 0500  Creatinine, serum  (enoxaparin (LOVENOX)    CrCl >/= 30 ml/min)  Weekly,   R    Comments:  while on enoxaparin therapy    12/03/17 1137   12/04/17 6440  Basic metabolic panel  Tomorrow morning,   R     12/03/17 1137   12/04/17 0500  CBC  Tomorrow morning,   R     12/03/17 1137   12/03/17 1137  Brain natriuretic peptide  Once,   R     12/03/17 1137   12/03/17 1137  Hemoglobin A1c  Once,   R    Comments:  To assess prior glycemic control    12/03/17 1137   12/03/17 0947  Blood culture (routine x 2)  BLOOD CULTURE X 2,   STAT     12/03/17 0946      Vitals/Pain Today's Vitals   12/03/17 0931 12/03/17 1034 12/03/17 1035 12/03/17 1139  BP:   128/83 131/71  Pulse:   64 81  Resp:   (!) 22 20  Temp:      TempSrc:      SpO2:   94% 94%  PainSc: 7  7       Isolation Precautions No active isolations  Medications Medications  vancomycin (VANCOCIN) 2,500 mg in sodium  chloride 0.9 % 500 mL IVPB (2,500 mg Intravenous New Bag/Given 12/03/17 1048)  atorvastatin (LIPITOR) tablet 5 mg (has no administration in time range)  busPIRone (BUSPAR) tablet 5 mg (  has no administration in time range)  clopidogrel (PLAVIX) tablet 75 mg (has no administration in time range)  gabapentin (NEURONTIN) capsule 300 mg (has no administration in time range)  levothyroxine (SYNTHROID, LEVOTHROID) tablet 125 mcg (has no administration in time range)  morphine (MS CONTIN) 12 hr tablet 100 mg (has no administration in time range)  pantoprazole (PROTONIX) EC tablet 40 mg (has no administration in time range)  tamsulosin (FLOMAX) capsule 0.4 mg (has no administration in time range)  traZODone (DESYREL) tablet 300 mg (has no administration in time range)  venlafaxine XR (EFFEXOR-XR) 24 hr capsule 150 mg (has no administration in time range)  naloxegol oxalate (MOVANTIK) tablet 25 mg (has no administration in time range)  enoxaparin (LOVENOX) injection 55 mg (has no administration in time range)  sodium chloride flush (NS) 0.9 % injection 3 mL (has no administration in time range)  sodium chloride flush (NS) 0.9 % injection 3 mL (has no administration in time range)  0.9 %  sodium chloride infusion (has no administration in time range)  acetaminophen (TYLENOL) tablet 650 mg (has no administration in time range)    Or  acetaminophen (TYLENOL) suppository 650 mg (has no administration in time range)  insulin glargine (LANTUS) injection 30 Units (has no administration in time range)  insulin aspart (novoLOG) injection 0-15 Units (has no administration in time range)  insulin aspart (novoLOG) injection 0-5 Units (has no administration in time range)  piperacillin-tazobactam (ZOSYN) IVPB 3.375 g (has no administration in time range)  piperacillin-tazobactam (ZOSYN) IVPB 3.375 g (0 g Intravenous Stopped 12/03/17 1048)    Mobility walks

## 2017-12-03 NOTE — Progress Notes (Signed)
A consult was received from an ED physician for Vancomycin per pharmacy dosing.  The patient's profile has been reviewed for ht/wt/allergies/indication/available labs.    A one time order has been placed for Vancomycin 2500mg  IV.  Further antibiotics/pharmacy consults should be ordered by admitting physician if indicated.                       Thank you, Luiz Ochoa 12/03/2017  10:10 AM

## 2017-12-03 NOTE — Consult Note (Addendum)
[facility] Nurse wound consult note Reason for Consult: Consult requested for right leg chronic full thickness wound. Pt is followed by the outpatient wound care center and wound has declined.  He is scheduled for an MRI.  Wife is at the bedside and is very well-informed regarding topical treatment.  They have been using Aciticote silver dressing, which is not available in the [facility].  Wife states she has a piece to use for dressing changes, and will be willing to perform them daily while pt is in the hospital.   Wound type: Full thickness stasis ulcer to right outer leg; many separate irregular-shaped wounds which are connected; total size is 13X9X.3cm Wound bed: yellow and moist, painful to touch Drainage (amount, consistency, odor) large amt yellow drainage, no odor Periwound: intact skin surrounding Dressing procedure/placement/frequency: Continue present plan of care with Acticote dressing changed twice weekly, and other layers of dressings changed daily; wound gel, ABD pads, kerlex, and coban.  Wife states she will perform the dressing changes while the patient is in the hospital, supplies at the bedside. Pt will resume followup at the outpatient wound care center after discharge. Please re-consult if further assistance is needed.  Thank-you,  Julien Girt MSN, [place], [place], [place], [place]

## 2017-12-03 NOTE — ED Provider Notes (Addendum)
[facility] DEPT Provider Note   CSN: [id number] Arrival date & time: 12/03/17  1914     History   Chief Complaint Chief Complaint  Patient presents with  . Wound Check    HPI Brad Taylor is a 67 y.o. male.  HPI Sent from the wound center for IV abx given lower extremity erythema worsening despite omnicef and septra which was initiated on 11/19/2017. RLE chronic wound has been present for over a year. Follows with vascular surgery. No fevers or chills. persistant pain and erythema. Symptoms are moderate in severity.    Today seen by Jeri Cos III PA-C at the hyperbaric wound center. Sent to ER from the office    Past Medical History:  Diagnosis Date  . AAA (abdominal aortic aneurysm) ([place]) 5/15   3.5x3.5 cm  . COPD (chronic obstructive pulmonary disease) ([place])   . Depression   . Diabetes mellitus without complication ([place])   . GERD (gastroesophageal reflux disease)   . Hyperlipidemia   . Hypothyroidism   . Post traumatic stress disorder   . Stroke ([place])   . Thyroid disease   . Ulcer     Patient Active Problem List   Diagnosis Date Noted  . Type 2 diabetes mellitus, uncontrolled ([place]) 07/25/2015  . Hemiplegia and hemiparesis following unspecified cerebrovascular disease affecting unspecified side ([place]) 07/25/2015  . Cellulitis, gluteal, left 01/19/2014  . Tobacco abuse 01/19/2014  . Aphasia as late effect of stroke 11/09/2013  . CVA (cerebral infarction) 09/20/2013  . Stroke [facility]) 09/15/2013    Past Surgical History:  Procedure Laterality Date  . ABDOMINAL AORTOGRAM W/LOWER EXTREMITY N/A 08/20/2017   Procedure: ABDOMINAL AORTOGRAM W/LOWER EXTREMITY;  Surgeon: Waynetta Sandy, MD;  Location: [facility] CV LAB;  Service: Cardiovascular;  Laterality: N/A;  . CATARACT EXTRACTION    . CHOLECYSTECTOMY    . KNEE SURGERY    . LUNG REMOVAL, PARTIAL     right, 2 years ago at [facility] for cancer  . PERIPHERAL VASCULAR ATHERECTOMY  Right 08/20/2017   Procedure: PERIPHERAL VASCULAR ATHERECTOMY;  Surgeon: Waynetta Sandy, MD;  Location: [facility] CV LAB;  Service: Cardiovascular;  Laterality: Right;  posterior tibial and tibeoperoneal trunk  . PERIPHERAL VASCULAR BALLOON ANGIOPLASTY Right 08/20/2017   Procedure: PERIPHERAL VASCULAR BALLOON ANGIOPLASTY;  Surgeon: Waynetta Sandy, MD;  Location: [facility] CV [facility];  Service: Cardiovascular;  Laterality: Right;  Anterior tibial        Home Medications    Prior to Admission medications   Medication Sig Start Date End Date Taking? Authorizing Provider  albuterol (PROVENTIL HFA;VENTOLIN HFA) 108 (90 Base) MCG/ACT inhaler INHALE 2 PUFFS INTO THE LUNGS EVERY 6 HOURS AS NEEDED FOR WHEEZE OR SHORTNESS OF BREATH 08/18/17   [name], MD  atorvastatin (LIPITOR) 10 MG tablet Take 5 mg by mouth daily. Takes 5 mg     [provider]  busPIRone (BUSPAR) 10 MG tablet Take 0.5 tablets (5 mg total) by mouth 2 (two) times daily as needed (for anxiety). Patient taking differently: Take 5 mg by mouth 4 (four) times daily.  10/08/13   Angiulli, Lavon Paganini, PA-C  clopidogrel (PLAVIX) 75 MG tablet Take 1 tablet (75 mg total) by mouth daily with breakfast. 02/01/14   [name], NP  gabapentin (NEURONTIN) 300 MG capsule Take 1 capsule (300 mg total) by mouth 3 (three) times daily. 11/07/17   [name], MD  insulin glargine (LANTUS) 100 UNIT/ML injection Inject 40 Units  into the skin 2 (two) times daily. s 10/08/13   [name], PA-C  Insulin Pen Needle (PEN NEEDLES 5/16") 30G X 8 MM MISC Use with insulin pen daily DX:E11.9 10/21/16   [name], MD  levothyroxine (SYNTHROID, LEVOTHROID) 125 MCG tablet Take 1 tablet (125 mcg total) by mouth daily before breakfast. 10/08/13   Angiulli, Lavon Paganini, PA-C  metFORMIN (GLUCOPHAGE) 500 MG tablet Take 500 mg by mouth 2 (two) times daily with a meal.    [provider]  morphine (MS CONTIN) 100 MG 12 hr  tablet Take 100 mg by mouth every 12 (twelve) hours.    [provider]  Multiple Vitamins-Minerals (MULTIVITAMIN WITH MINERALS) tablet Take 1 tablet by mouth daily.    [provider]  naloxegol oxalate (MOVANTIK) 25 MG TABS tablet Take 1 tablet (25 mg total) by mouth daily. 11/24/17   [name], MD  RABEprazole (ACIPHEX) 20 MG tablet Take 2 tablets (40 mg total) by mouth daily before breakfast. 10/08/13   Angiulli, Lavon Paganini, PA-C  tamsulosin (FLOMAX) 0.4 MG CAPS capsule Take 0.4 mg by mouth daily.    [provider]  testosterone cypionate (DEPOTESTOSTERONE CYPIONATE) 200 MG/ML injection Inject 1 mL (200 mg total) into the muscle every 14 (fourteen) days. 09/19/17   [name], MD  traZODone (DESYREL) 100 MG tablet Take 3 tablets (300 mg total) by mouth at bedtime. 10/08/13   Angiulli, Lavon Paganini, PA-C  venlafaxine XR (EFFEXOR-XR) 150 MG 24 hr capsule Take 1 capsule (150 mg total) by mouth daily with breakfast. Patient taking differently: 150 mg 2 (two) times daily.  10/08/13   Angiulli, Lavon Paganini, PA-C    Family History Family History  Problem Relation Age of Onset  . Emphysema Father   . Thyroid disease Sister   . Stroke Maternal Grandmother   . Thyroid disease Sister   . Thyroid disease Sister     Social History Social History   Tobacco Use  . Smoking status: Current Every Day Smoker    Packs/day: 1.00    Years: 30.00    Pack years: 30.00    Types: Cigarettes  . Smokeless tobacco: Former Systems developer    Quit date: 05/04/2013  . Tobacco comment: Less than 1/2 pk per day  Substance Use Topics  . Alcohol use: No    Comment: Former heavy drinker, been sober for 10 years  . Drug use: No     Allergies   Patient has no known allergies.   Review of Systems Review of Systems  All other systems reviewed and are negative.    Physical Exam Updated Vital Signs BP 129/75 (BP Location: Left Arm)   Pulse 75   Temp 97.8 F (36.6 C) (Oral)   Resp (!)  22   SpO2 91%   Physical Exam  Constitutional: He is oriented to person, place, and time. He appears well-developed and well-nourished.  HENT:  Head: Normocephalic.  Eyes: EOM are normal.  Neck: Normal range of motion.  Pulmonary/Chest: Effort normal.  Abdominal: He exhibits no distension.  Musculoskeletal: Normal range of motion.  Right lateral lower leg with chronic wound and surrounding warmth and erythema to the level of the proximal tibia and down to the level of the right ankle.   Neurological: He is alert and oriented to person, place, and time.  Psychiatric: He has a normal mood and affect.  Nursing note and vitals reviewed.    ED Treatments / Results  Labs (all  labs ordered are listed, but only abnormal results are displayed) Labs Reviewed  CBC - Abnormal; Notable for the following components:      Result Value   WBC 10.8 (*)    All other components within normal limits  CULTURE, BLOOD (ROUTINE X 2)  CULTURE, BLOOD (ROUTINE X 2)  BASIC METABOLIC PANEL    EKG None  Radiology Dg Tibia/fibula Right  Result Date: 12/03/2017 CLINICAL DATA:  Lateral distal right lower extremity chronic ulcer. Clinical concern for osteomyelitis. EXAM: RIGHT TIBIA AND FIBULA - 2 VIEW COMPARISON:  None. FINDINGS: Large soft tissue ulcer is noted anterolateral to the distal right fibular shaft. There are tiny cortical erosions along the lateral distal right fibular shaft. There is associated irregular mild periosteal reaction throughout the distal right fibular shaft. Mild smooth periosteal reaction is present in the mid right femoral shaft laterally. No suspicious focal osseous lesions. No fractures. No radiopaque foreign body. No appreciable soft tissue gas. IMPRESSION: Large soft tissue ulcer anterolateral to the distal right fibular shaft. Radiographic evidence of acute/subacute osteomyelitis in the distal right fibular shaft including tiny cortical erosions and irregular periosteal reaction.  Smooth periosteal reaction more proximally in the mid right femoral shaft is nonspecific with osteomyelitis not excluded. Electronically Signed   By: Ilona Sorrel M.D.   On: 12/03/2017 10:43    Procedures Procedures (including critical care time)  Medications Ordered in ED Medications  vancomycin (VANCOCIN) 2,500 mg in sodium chloride 0.9 % 500 mL IVPB (2,500 mg Intravenous New Bag/Given 12/03/17 1048)  piperacillin-tazobactam (ZOSYN) IVPB 3.375 g (0 g Intravenous Stopped 12/03/17 1048)     Initial Impression / Assessment and Plan / ED Course  I have reviewed the triage vital signs and the nursing notes.  Pertinent labs & imaging results that were available during my care of the patient were reviewed by me and considered in my medical decision making (see chart for details).     Failed outpatient treatment of cellulitis of right RLE. Will be initiated on IV abx per the request of the wound team and admitted to the hospital. Vitals stable. Labs and blood cultures obtained prior to initiation of the IV abx  10:49 AM I personally reviewed the patient's x-ray of the right tib-fib today.  There is periosteal changes on the fibula concerning for osteo-.  Appropriate IV antibiotic's.  May benefit from MRI.  Admission to the hospitalist.  White blood cell count 10,800  Consultations: [facility]  Final Clinical Impressions(s) / ED Diagnoses   Final diagnoses:  Cellulitis of right lower extremity without foot  Osteomyelitis of right fibula, unspecified type [facility])    ED Discharge Orders    None       Jola Schmidt, MD 12/03/17 5284    Jola Schmidt, MD 12/03/17 1050

## 2017-12-04 LAB — BASIC METABOLIC PANEL
ANION GAP: 9 (ref 5–15)
BUN: 12 mg/dL (ref 6–20)
CHLORIDE: 101 mmol/L (ref 101–111)
CO2: 31 mmol/L (ref 22–32)
CREATININE: 0.79 mg/dL (ref 0.61–1.24)
Calcium: 8.9 mg/dL (ref 8.9–10.3)
GFR calc non Af Amer: >60 mL/min (ref >60)
Glucose, Bld: 77 mg/dL (ref 65–99)
POTASSIUM: 4.3 mmol/L (ref 3.5–5.1)
SODIUM: 141 mmol/L (ref 135–145)

## 2017-12-04 LAB — CBC
HEMATOCRIT: 45.1 % (ref 39.0–52.0)
HEMOGLOBIN: 14.1 g/dL (ref 13.0–17.0)
MCH: 29.3 pg (ref 26.0–34.0)
MCHC: 31.3 g/dL (ref 30.0–36.0)
MCV: 93.6 fL (ref 78.0–100.0)
PLATELETS: 235 10*3/uL (ref 150–400)
RBC: 4.82 MIL/uL (ref 4.22–5.81)
RDW: 13.8 % (ref 11.5–15.5)
WBC: 8.2 10*3/uL (ref 4.0–10.5)

## 2017-12-04 LAB — GLUCOSE, CAPILLARY
GLUCOSE-CAPILLARY: 127 mg/dL — AB (ref 65–99)
GLUCOSE-CAPILLARY: 203 mg/dL — AB (ref 65–99)
Glucose-Capillary: 78 mg/dL (ref 65–99)
Glucose-Capillary: 157 mg/dL — ABNORMAL HIGH (ref 65–99)

## 2017-12-04 MED ORDER — ALBUTEROL SULFATE (2.5 MG/3ML) 0.083% IN NEBU
2.5000 mg | INHALATION_SOLUTION | Freq: Four times a day (QID) | RESPIRATORY_TRACT | Status: DC | PRN
  Administered 2017-12-04: 2.5 mg via RESPIRATORY_TRACT
  Filled 2017-12-04: qty 3

## 2017-12-04 NOTE — Progress Notes (Signed)
Patient ID: Brad Taylor, male   DOB: 03-17-1951, 67 y.o.   MRN: [id number]  PROGRESS NOTE    EDGEL DEGNAN  HQP:[id number] DOB: 1951-02-01 DOA: 12/03/2017 PCP: [name], MD    Brief Narrative:  Brad Taylor is a 67 y.o. male with medical history significant for poorly controlled diabetes with secondary neuropathy, history of CVA and chronic left leg ulcer who was started on p.o. antibiotics about a week ago for cellulitis surrounding this ulcer.  When patient followed up today, erythema persistent with no improvement and continued pain so he was sent over to the emergency room.  Patient's pain in that leg, specifically the area surrounding the ulcer has become painful in the past week without any improvement, even after starting antibiotics. Patient has been wanting to leave against medical advise.     Assessment & Plan:   Principal Problem:   Osteomyelitis of femur (HCC) Active Problems:   Tobacco abuse   Uncontrolled type 2 diabetes mellitus with diabetic neuropathy, with long-term current use of insulin (HCC)   Hypothyroidism   Chronic ulcer of left leg (HCC)   Cellulitis of left leg   Chronic diastolic CHF (congestive heart failure) ([place])   1. Possible Osteomyelitis: patient initiated on Vancomycin and Zosyn. MRI shows Soft tissue ulcerations along the distal right leg with underlying soft tissue edema. No drainable fluid collections. Mild periostitis of the distal fibular diaphysis without conclusive evidence for osteomyelitis. We wioll await culture results and see if patient can be Dc'd on oral antibiotics. ID consult tomorrow if culture positive.  2. IDDM: Continue home regimen and SSI  3. Tobacco abuse: Counseling provided  4. Chronic Diastolic CHF: Compensated  5. Hx of CVA: Stable.     DVT prophylaxis: Lovenox Code Status: Full Family Communication: Wife at bedside with patient Disposition Plan: Home to resume care at wound clinic  Consultants:     None  Procedures: MRI of the LE Antimicrobials:  Zosyn and Vancomycin  Subjective: Patient is anxious and wants to go home. Leg is still wrapped and dressed by wife and Nurse. No fever, no chills, no NvD.  Objective: Vitals:   12/03/17 2004 12/04/17 0542 12/04/17 1348 12/04/17 2035  BP: (!) 142/72 115/72 137/79 120/61  Pulse: (!) 56 80 (!) 56 72  Resp: 15 17 18 18   Temp: (!) 97.3 F (36.3 C) 98 F (36.7 C) 97.6 F (36.4 C) (!) 97.5 F (36.4 C)  TempSrc: Oral Oral Oral Oral  SpO2: 93% (!) 84% 95% 90%  Height:        Intake/Output Summary (Last 24 hours) at 12/04/2017 2350 Last data filed at 12/04/2017 1846 Gross per 24 hour  Intake 800 ml  Output -  Net 800 ml   There were no vitals filed for this visit.  Examination:  General exam: Appears calm and comfortable  Respiratory system: Clear to auscultation. Respiratory effort normal. Cardiovascular system: S1 & S2 heard, RRR. No JVD, murmurs, rubs, gallops or clicks. No pedal edema. Gastrointestinal system: Abdomen is nondistended, soft and nontender. No organomegaly or masses felt. Normal bowel sounds heard. Central nervous system: Alert and oriented. No focal neurological deficits. Extremities: Symmetric 5 x 5 power. Skin: Right LE has a large ulcer on the anterior part, deep, clean, no pus, good granulation tissue. Psychiatry: Judgement and insight appear normal. Mood & affect appropriate.     Data Reviewed: I have personally reviewed following labs and imaging studies  CBC: Recent Labs  Lab 12/03/17  1007 12/04/17 0613  WBC 10.8* 8.2  HGB 14.7 14.1  HCT 45.4 45.1  MCV 92.7 93.6  PLT 231 992   Basic Metabolic Panel: Recent Labs  Lab 12/03/17 1007 12/04/17 0613  NA 137 141  K 4.7 4.3  CL 99* 101  CO2 28 31  GLUCOSE 197* 77  BUN 12 12  CREATININE 0.75 0.79  CALCIUM 9.0 8.9   GFR: Estimated Creatinine Clearance: 111.5 mL/min (by C-G formula based on SCr of 0.79 mg/dL). Liver Function Tests: No  results for input(s): AST, ALT, ALKPHOS, BILITOT, PROT, ALBUMIN in the last 168 hours. No results for input(s): LIPASE, AMYLASE in the last 168 hours. No results for input(s): AMMONIA in the last 168 hours. Coagulation Profile: No results for input(s): INR, PROTIME in the last 168 hours. Cardiac Enzymes: No results for input(s): CKTOTAL, CKMB, CKMBINDEX, TROPONINI in the last 168 hours. BNP (last 3 results) No results for input(s): PROBNP in the last 8760 hours. HbA1C: Recent Labs    12/03/17 1149  HGBA1C 7.3*   CBG: Recent Labs  Lab 12/03/17 2110 12/04/17 0735 12/04/17 1121 12/04/17 1651 12/04/17 2057  GLUCAP 160* 78 127* 157* 203*   Lipid Profile: No results for input(s): CHOL, HDL, LDLCALC, TRIG, CHOLHDL, LDLDIRECT in the last 72 hours. Thyroid Function Tests: No results for input(s): TSH, T4TOTAL, FREET4, T3FREE, THYROIDAB in the last 72 hours. Anemia Panel: No results for input(s): VITAMINB12, FOLATE, FERRITIN, TIBC, IRON, RETICCTPCT in the last 72 hours. Urine analysis:    Component Value Date/Time   COLORURINE YELLOW 11/03/2017 1517   APPEARANCEUR CLEAR 11/03/2017 1517   LABSPEC 1.021 11/03/2017 1517   PHURINE 5.0 11/03/2017 1517   GLUCOSEU NEGATIVE 11/03/2017 1517   HGBUR NEGATIVE 11/03/2017 1517   BILIRUBINUR NEGATIVE 11/03/2017 1517   KETONESUR 5 (A) 11/03/2017 1517   PROTEINUR NEGATIVE 11/03/2017 1517   UROBILINOGEN 1.0 09/25/2013 1009   NITRITE NEGATIVE 11/03/2017 1517   LEUKOCYTESUR NEGATIVE 11/03/2017 1517   Sepsis Labs: @LABRCNTIP (procalcitonin:4,lacticidven:4)  ) Recent Results (from the past 240 hour(s))  Blood culture (routine x 2)     Status: None (Preliminary result)   Collection Time: 12/03/17 10:07 AM  Result Value Ref Range Status   Specimen Description   Final    BLOOD RIGHT ANTECUBITAL Performed at [facility], [place] [address], [place], [place] [address]    Special Requests   Final    BOTTLES DRAWN AEROBIC AND  ANAEROBIC Blood Culture results may not be optimal due to an excessive volume of blood received in culture bottles Performed at [facility] [address] [place], [place] [address]    Culture   Final    NO GROWTH 1 DAY Performed at [facility], [place] [address] [place], [place] [address]    Report Status PENDING  Incomplete  Blood culture (routine x 2)     Status: None (Preliminary result)   Collection Time: 12/03/17 10:31 AM  Result Value Ref Range Status   Specimen Description   Final    BLOOD RIGHT WRIST Performed at [facility] [address] [place], [place] [address]    Special Requests   Final    BOTTLES DRAWN AEROBIC AND ANAEROBIC Blood Culture adequate volume Performed at [facility] [address] [place], [place] [address]    Culture   Final    NO GROWTH 1 DAY Performed at [facility], [place] [address], [place], [place] [address]  Report Status PENDING  Incomplete         Radiology Studies: Dg Tibia/fibula Right  Result Date: 12/03/2017 CLINICAL DATA:  Lateral distal right lower extremity chronic ulcer. Clinical concern for osteomyelitis. EXAM: RIGHT TIBIA AND FIBULA - 2 VIEW COMPARISON:  None. FINDINGS: Large soft tissue ulcer is noted anterolateral to the distal right fibular shaft. There are tiny cortical erosions along the lateral distal right fibular shaft. There is associated irregular mild periosteal reaction throughout the distal right fibular shaft. Mild smooth periosteal reaction is present in the mid right femoral shaft laterally. No suspicious focal osseous lesions. No fractures. No radiopaque foreign body. No appreciable soft tissue gas. IMPRESSION: Large soft tissue ulcer anterolateral to the distal right fibular shaft. Radiographic evidence of acute/subacute osteomyelitis in the distal right fibular shaft including tiny cortical erosions and irregular periosteal reaction. Smooth  periosteal reaction more proximally in the mid right femoral shaft is nonspecific with osteomyelitis not excluded. Electronically Signed   By: Ilona Sorrel M.D.   On: 12/03/2017 10:43   Mr Tibia Fibula Right Wo Contrast  Result Date: 12/03/2017 CLINICAL DATA:  Non healing soft tissue wound of the right tibia and fibula x2 years. Right leg pain and swelling with drainage. EXAM: MRI OF LOWER RIGHT EXTREMITY WITHOUT CONTRAST TECHNIQUE: Multiplanar, multisequence MR imaging of the right leg was performed. No intravenous contrast was administered. COMPARISON:  Same day radiographs of the right leg FINDINGS: The patient refused contrast per technologist's due to pain. Bones/Joint/Cartilage No acute fracture or frank bone destruction. Mild reactive periostitis of the distal fibular diaphysis is suggested with surrounding soft tissue edema. Ligaments Noncontributory Muscles and Tendons No intramuscular fluid collection or abscess. Generalized muscular atrophy of both legs. The tendons crossing the ankle joint are of normal signal intensity morphology. Soft tissues Soft tissue ulcerations along the lateral as well as anterolateral and posterolateral aspect of the distal right leg. IMPRESSION: Soft tissue ulcerations along the distal right leg with underlying soft tissue edema. No drainable fluid collections. Mild periostitis of the distal fibular diaphysis without conclusive evidence for osteomyelitis. Electronically Signed   By: Ashley Royalty M.D.   On: 12/03/2017 20:57        Scheduled Meds: . atorvastatin  5 mg Oral q1800  . busPIRone  5 mg Oral QID  . clopidogrel  75 mg Oral Q breakfast  . enoxaparin (LOVENOX) injection  0.5 mg/kg Subcutaneous Q24H  . gabapentin  300 mg Oral TID  . insulin aspart  0-15 Units Subcutaneous TID WC  . insulin aspart  0-5 Units Subcutaneous QHS  . insulin glargine  30 Units Subcutaneous BID  . levothyroxine  125 mcg Oral QAC breakfast  . morphine  100 mg Oral Q12H  .  naloxegol oxalate  25 mg Oral Daily  . nicotine  7 mg Transdermal Daily  . pantoprazole  40 mg Oral Daily  . sodium chloride flush  3 mL Intravenous Q12H  . tamsulosin  0.4 mg Oral Daily  . traZODone  300 mg Oral QHS  . venlafaxine XR  150 mg Oral Q breakfast   Continuous Infusions: . sodium chloride    . piperacillin-tazobactam (ZOSYN)  IV 3.375 g (12/04/17 1846)     LOS: 1 day    Time spent: 30 minutes    GARBA,LAWAL, MD [facility] Pager [phone]  If 7PM-7AM, please contact night-coverage [email or web address] Password [email or web address] 12/04/2017, 11:50 PM

## 2017-12-05 DIAGNOSIS — I5032 Chronic diastolic (congestive) heart failure: Secondary | ICD-10-CM

## 2017-12-05 DIAGNOSIS — M869 Osteomyelitis, unspecified: Secondary | ICD-10-CM

## 2017-12-05 DIAGNOSIS — L03116 Cellulitis of left lower limb: Secondary | ICD-10-CM

## 2017-12-05 DIAGNOSIS — L97922 Non-pressure chronic ulcer of unspecified part of left lower leg with fat layer exposed: Secondary | ICD-10-CM

## 2017-12-05 DIAGNOSIS — Z720 Tobacco use: Secondary | ICD-10-CM

## 2017-12-05 DIAGNOSIS — Z794 Long term (current) use of insulin: Secondary | ICD-10-CM

## 2017-12-05 DIAGNOSIS — E1165 Type 2 diabetes mellitus with hyperglycemia: Secondary | ICD-10-CM

## 2017-12-05 DIAGNOSIS — E1140 Type 2 diabetes mellitus with diabetic neuropathy, unspecified: Secondary | ICD-10-CM

## 2017-12-05 DIAGNOSIS — E039 Hypothyroidism, unspecified: Secondary | ICD-10-CM

## 2017-12-05 LAB — GLUCOSE, CAPILLARY
Glucose-Capillary: 80 mg/dL (ref 65–99)
Glucose-Capillary: 99 mg/dL (ref 65–99)
Glucose-Capillary: 179 mg/dL — ABNORMAL HIGH (ref 65–99)
Glucose-Capillary: 87 mg/dL (ref 65–99)

## 2017-12-05 MED ORDER — VANCOMYCIN HCL IN DEXTROSE 1-5 GM/200ML-% IV SOLN
1000.0000 mg | Freq: Two times a day (BID) | INTRAVENOUS | Status: DC
  Administered 2017-12-05 – 2017-12-06 (×2): 1000 mg via INTRAVENOUS
  Filled 2017-12-05: qty 200

## 2017-12-05 MED ORDER — SODIUM CHLORIDE 0.9 % IV SOLN
1500.0000 mg | Freq: Once | INTRAVENOUS | Status: AC
  Administered 2017-12-05: 1500 mg via INTRAVENOUS
  Filled 2017-12-05: qty 1500

## 2017-12-05 NOTE — Progress Notes (Signed)
Pharmacy Antibiotic Note  OTHER ATIENZA is a 67 y.o. male admitted on 12/03/2017 with osteomyeltis.  Pharmacy has been consulted for vancomycin dosing.  Plan: 5/1 1100 Patient received Vancomycin 2500 mg x1 Today 5/3:  Vancomycin 1500 mg x1 then 1 Gm IV q12h for est AUC = 538 Goal AUC = 400-500 F/u scr/cultures/levels Continue ZEI  Height: 5\' 10"  (177.8 cm) IBW/kg (Calculated) : 73  Temp (24hrs), Avg:97.5 F (36.4 C), Min:97.5 F (36.4 C), Max:97.6 F (36.4 C)  Recent Labs  Lab 12/03/17 1007 12/04/17 0613  WBC 10.8* 8.2  CREATININE 0.75 0.79    Estimated Creatinine Clearance: 111.5 mL/min (by C-G formula based on SCr of 0.79 mg/dL).    No Known Allergies  Antimicrobials this admission: 5/1 zosyn >>  5/1 Vanc 2500 mg x1 >> 5/3 vancomycin >>  Dose adjustments this admission:   Microbiology results:  BCx:   UCx:    Sputum:    MRSA PCR:   Thank you for allowing pharmacy to be a part of this patient's care.  Dorrene German 12/05/2017 6:39 AM

## 2017-12-05 NOTE — Progress Notes (Signed)
PROGRESS NOTE    Brad Taylor  ZOX:[id number] DOB: [birth year] DOA: 12/03/2017 PCP: [name], MD   Brief Narrative:  HPI 12/03/2017 by Dr. Algis Downs Brad Taylor is a 67 y.o. male with medical history significant for poorly controlled diabetes with secondary neuropathy, history of CVA and chronic left leg ulcer who was started on p.o. antibiotics about a week ago for cellulitis surrounding this ulcer.  When patient followed up today, erythema persistent with no improvement and continued pain so he was sent over to the emergency room.  Patient's pain in that leg, specifically the area surrounding the ulcer has become painful in the past week without any improvement, even after starting antibiotics.  Assessment & Plan   Chronic nonhealing leg ulcer with cellulitis -failed outpatient antibiotics, Bactrim DS given on 4/17 for 10days, followed by Cefdinir on  4/24 for 10 days -presented with worsening erythema and pain- which are now improving -placed on vancomycin and zosyn -cultures from 11/19/2017 showed resistant staph aureus and pseudomas aeruginosa  -blood cultures show no growth to date -MRI right tibia/fibula: Soft tissue ulceration along the distal right leg with underlying soft tissue edema.  No drainable fluid collection. Mild periostitis of the distal fibular diaphysis without conclusive evidence of osteomyelitis  Diabetes mellitus, type II -hemoglobin A1c 7.3 -Continue Lantus, insulin sliding scale, CBG monitoring  Hypothyroidism -continue synthroid   History of CVA -continue aspirin, plavix, statin  Tobacco abuse -counseled -patient states he is down to a 1/4 ppd -Continue nicotine patch  Chronic diastolic heart failure -Currently compensated, continue to monitor intake and output, daily weights  Depression -Continue effexor  DVT Prophylaxis  lovenox  Code Status: Full  Family Communication: None at bedside  Disposition Plan: Admitted. Pending  further improvement. Will discuss with pharmacy possible outpatient antibiotics  Consultants none  Procedures  None  Antibiotics   Anti-infectives (From admission, onward)   Start     Dose/Rate Route Frequency Ordered Stop   12/05/17 1800  vancomycin (VANCOCIN) IVPB 1000 mg/200 mL premix     1,000 mg 200 mL/hr over 60 Minutes Intravenous Every 12 hours 12/05/17 0643     12/05/17 0645  vancomycin (VANCOCIN) 1,500 mg in sodium chloride 0.9 % 500 mL IVPB     1,500 mg 250 mL/hr over 120 Minutes Intravenous  Once 12/05/17 0636 12/05/17 1047   12/03/17 1800  piperacillin-tazobactam (ZOSYN) IVPB 3.375 g     3.375 g 12.5 mL/hr over 240 Minutes Intravenous Every 8 hours 12/03/17 1217     12/03/17 1015  vancomycin (VANCOCIN) 2,500 mg in sodium chloride 0.9 % 500 mL IVPB     2,500 mg 250 mL/hr over 120 Minutes Intravenous STAT 12/03/17 1010 12/03/17 1240   12/03/17 1000  piperacillin-tazobactam (ZOSYN) IVPB 3.375 g     3.375 g 100 mL/hr over 30 Minutes Intravenous  Once 12/03/17 0957 12/03/17 1048      Subjective:   Renee Pain seen and examined today.  Feels pain and redness have improved.  Denies current chest pain, shortness of breath, abdominal pain, nausea vomiting, diarrhea or constipation, dizziness or headache.  Objective:   Vitals:   12/04/17 1348 12/04/17 2035 12/05/17 0544 12/05/17 1348  BP: 137/79 120/61 109/63 (!) 141/78  Pulse: (!) 56 72 (!) 53 72  Resp: 18 18 16 18   Temp: 97.6 F (36.4 C) (!) 97.5 F (36.4 C) (!) 97.5 F (36.4 C) 98.1 F (36.7 C)  TempSrc: Oral Oral Oral Oral  SpO2: 95% 90%  96% 93%  Height:        Intake/Output Summary (Last 24 hours) at 12/05/2017 1505 Last data filed at 12/05/2017 0700 Gross per 24 hour  Intake 250 ml  Output 600 ml  Net -350 ml   There were no vitals filed for this visit.  Exam  General: Well developed, well nourished, NAD, appears stated age  HEENT: NCAT, mucous membranes moist.   Neck: Supple  Cardiovascular:  S1 S2 auscultated, 2/6SEM, RRR  Respiratory: Diffuse expiratory wheezing  Abdomen: Soft, nontender, nondistended, + bowel sounds  Extremities: warm dry without cyanosis clubbing. +LE edema B/L   Neuro: AAOx3, nonfocal  Skin: Erythema of the right lower extremity with noted ulceration  Psych: Normal affect and demeanor with intact judgement and insight   Data Reviewed: I have personally reviewed following labs and imaging studies  CBC: Recent Labs  Lab 12/03/17 1007 12/04/17 0613  WBC 10.8* 8.2  HGB 14.7 14.1  HCT 45.4 45.1  MCV 92.7 93.6  PLT 231 073   Basic Metabolic Panel: Recent Labs  Lab 12/03/17 1007 12/04/17 0613  NA 137 141  K 4.7 4.3  CL 99* 101  CO2 28 31  GLUCOSE 197* 77  BUN 12 12  CREATININE 0.75 0.79  CALCIUM 9.0 8.9   GFR: Estimated Creatinine Clearance: 111.5 mL/min (by C-G formula based on SCr of 0.79 mg/dL). Liver Function Tests: No results for input(s): AST, ALT, ALKPHOS, BILITOT, PROT, ALBUMIN in the last 168 hours. No results for input(s): LIPASE, AMYLASE in the last 168 hours. No results for input(s): AMMONIA in the last 168 hours. Coagulation Profile: No results for input(s): INR, PROTIME in the last 168 hours. Cardiac Enzymes: No results for input(s): CKTOTAL, CKMB, CKMBINDEX, TROPONINI in the last 168 hours. BNP (last 3 results) No results for input(s): PROBNP in the last 8760 hours. HbA1C: Recent Labs    12/03/17 1149  HGBA1C 7.3*   CBG: Recent Labs  Lab 12/04/17 1121 12/04/17 1651 12/04/17 2057 12/05/17 0731 12/05/17 1217  GLUCAP 127* 157* 203* 80 99   Lipid Profile: No results for input(s): CHOL, HDL, LDLCALC, TRIG, CHOLHDL, LDLDIRECT in the last 72 hours. Thyroid Function Tests: No results for input(s): TSH, T4TOTAL, FREET4, T3FREE, THYROIDAB in the last 72 hours. Anemia Panel: No results for input(s): VITAMINB12, FOLATE, FERRITIN, TIBC, IRON, RETICCTPCT in the last 72 hours. Urine analysis:    Component Value  Date/Time   COLORURINE YELLOW 11/03/2017 1517   APPEARANCEUR CLEAR 11/03/2017 1517   LABSPEC 1.021 11/03/2017 1517   PHURINE 5.0 11/03/2017 1517   GLUCOSEU NEGATIVE 11/03/2017 1517   HGBUR NEGATIVE 11/03/2017 1517   BILIRUBINUR NEGATIVE 11/03/2017 1517   KETONESUR 5 (A) 11/03/2017 1517   PROTEINUR NEGATIVE 11/03/2017 1517   UROBILINOGEN 1.0 09/25/2013 1009   NITRITE NEGATIVE 11/03/2017 1517   LEUKOCYTESUR NEGATIVE 11/03/2017 1517   Sepsis Labs: @LABRCNTIP (procalcitonin:4,lacticidven:4)  ) Recent Results (from the past 240 hour(s))  Blood culture (routine x 2)     Status: None (Preliminary result)   Collection Time: 12/03/17 10:07 AM  Result Value Ref Range Status   Specimen Description   Final    BLOOD RIGHT ANTECUBITAL Performed at [facility], [place] [address] [place], [place] [address]    Special Requests   Final    BOTTLES DRAWN AEROBIC AND ANAEROBIC Blood Culture results may not be optimal due to an excessive volume of blood received in culture bottles Performed at [facility] [address] [place], [place]  [address]    Culture   Final    NO GROWTH 2 DAYS Performed at [facility], [place] [address], [place], [place] [address]    Report Status PENDING  Incomplete  Blood culture (routine x 2)     Status: None (Preliminary result)   Collection Time: 12/03/17 10:31 AM  Result Value Ref Range Status   Specimen Description   Final    BLOOD RIGHT WRIST Performed at [facility] [address] [place], [place] [address]    Special Requests   Final    BOTTLES DRAWN AEROBIC AND ANAEROBIC Blood Culture adequate volume Performed at [facility] [address], [place], [place] [address]    Culture   Final    NO GROWTH 2 DAYS Performed at [facility] [address] [place], [place] [address]    Report Status PENDING  Incomplete      Radiology Studies: Mr Tibia Fibula Right Wo  Contrast  Result Date: 12/03/2017 CLINICAL DATA:  Non healing soft tissue wound of the right tibia and fibula x2 years. Right leg pain and swelling with drainage. EXAM: MRI OF LOWER RIGHT EXTREMITY WITHOUT CONTRAST TECHNIQUE: Multiplanar, multisequence MR imaging of the right leg was performed. No intravenous contrast was administered. COMPARISON:  Same day radiographs of the right leg FINDINGS: The patient refused contrast per technologist's due to pain. Bones/Joint/Cartilage No acute fracture or frank bone destruction. Mild reactive periostitis of the distal fibular diaphysis is suggested with surrounding soft tissue edema. Ligaments Noncontributory Muscles and Tendons No intramuscular fluid collection or abscess. Generalized muscular atrophy of both legs. The tendons crossing the ankle joint are of normal signal intensity morphology. Soft tissues Soft tissue ulcerations along the lateral as well as anterolateral and posterolateral aspect of the distal right leg. IMPRESSION: Soft tissue ulcerations along the distal right leg with underlying soft tissue edema. No drainable fluid collections. Mild periostitis of the distal fibular diaphysis without conclusive evidence for osteomyelitis. Electronically Signed   By: Ashley Royalty M.D.   On: 12/03/2017 20:57     Scheduled Meds: . atorvastatin  5 mg Oral q1800  . busPIRone  5 mg Oral QID  . clopidogrel  75 mg Oral Q breakfast  . enoxaparin (LOVENOX) injection  0.5 mg/kg Subcutaneous Q24H  . gabapentin  300 mg Oral TID  . insulin aspart  0-15 Units Subcutaneous TID WC  . insulin aspart  0-5 Units Subcutaneous QHS  . insulin glargine  30 Units Subcutaneous BID  . levothyroxine  125 mcg Oral QAC breakfast  . morphine  100 mg Oral Q12H  . naloxegol oxalate  25 mg Oral Daily  . nicotine  7 mg Transdermal Daily  . pantoprazole  40 mg Oral Daily  . sodium chloride flush  3 mL Intravenous Q12H  . tamsulosin  0.4 mg Oral Daily  . traZODone  300 mg Oral QHS  .  venlafaxine XR  150 mg Oral Q breakfast   Continuous Infusions: . sodium chloride    . piperacillin-tazobactam (ZOSYN)  IV 3.375 g (12/05/17 1107)  . vancomycin       LOS: 2 days   Time Spent in minutes   45 minutes (greater than 50% of time spent with patient face to face, as well as reviewing old records, and formulating a plan)  Jarely Juncaj D.O. on 12/05/2017 at 3:05 PM  Between 7am to 7pm - Pager - [phone]  After 7pm go to [email or web address] - password  [email or web address]  And look for the night coverage person covering for me after hours  [facility] Office  [phone]

## 2017-12-05 NOTE — Consult Note (Addendum)
[facility] Nurse wound consult note Reason for Consult: Nonhealing neuropathic wound to right lateral leg. Seen at wound care center and uses Acticoat.  Wound is very painful.   Wound type:Chronic nonhealing Pressure Injury POA: NA Measurement: 8.3 cm x 5.8 cm x 0.3 cm  Wound bed:100% fibrin slough Drainage (amount, consistency, odor) minimal serosanguinous no odor Periwound: Chronic skin changes.   Dressing procedure/placement/frequency:Per wound care center orders: Cleanse wound to right leg with mild soap and water.  Cover nonintact lesion with acticoat flex.  Top with hydrogel and ABD pad.  Wrap from ankle to below knee with kerlix and secure with self adherent Coban.  Acticoat is to be changed on Tuesday and Friday but change ABD, kerlix and Coban daily.   Will not follow at this time.  Please re-consult if needed.  Domenic Moras RN BSN [place] Pager [phone]

## 2017-12-05 NOTE — Care Management Note (Signed)
Case Management Note  Patient Details  Name: Brad Taylor MRN: [id number] Date of Birth: 1951/02/19  Subjective/Objective:                  Osteomyelitis of the left femur and leg ulcer Action/Plan: Has been seen in the wound clinic ac admission will go back once stable/failed outpt treatments in the office/iv abx started.  Expected Discharge Date:  (unknown)               Expected Discharge Plan:  [place]  In-House Referral:     Discharge planning Services  CM Consult  Post Acute Care Choice:    Choice offered to:     DME Arranged:    DME Agency:     HH Arranged:    HH Agency:     Status of Service:  In process, will continue to follow  If discussed at Long Length of Stay Meetings, dates discussed:    Additional Comments:  Leeroy Cha, RN 12/05/2017, 10:02 AM

## 2017-12-06 LAB — GLUCOSE, CAPILLARY: Glucose-Capillary: 76 mg/dL (ref 65–99)

## 2017-12-06 LAB — CREATININE, SERUM
Creatinine, Ser: 0.97 mg/dL (ref 0.61–1.24)
GFR calc non Af Amer: >60 mL/min (ref >60)

## 2017-12-06 MED ORDER — NICOTINE 7 MG/24HR TD PT24: 7.0000 mg | MEDICATED_PATCH | Freq: Every day | TRANSDERMAL | 0 refills | Status: DC

## 2017-12-06 MED ORDER — AMOXICILLIN 500 MG PO CAPS: 500.0000 mg | ORAL_CAPSULE | Freq: Three times a day (TID) | ORAL | 0 refills | Status: AC

## 2017-12-06 MED ORDER — CIPROFLOXACIN HCL 500 MG PO TABS: 500.0000 mg | ORAL_TABLET | Freq: Two times a day (BID) | ORAL | 0 refills | Status: DC

## 2017-12-06 NOTE — Discharge Summary (Signed)
Physician Discharge Summary  Brad Taylor:[id number] DOB: 01-Feb-1951 DOA: 12/03/2017  PCP: [name], MD  Admit date: 12/03/2017 Discharge date: 12/06/2017  Time spent: 45 minutes  Recommendations for Outpatient Follow-up:  Patient will be discharged to home. Continue follow up with the wound care clinic.  Patient will need to follow up with primary care provider within one week of discharge.  Patient should continue medications as prescribed.  Patient should follow a heart healthy/carb modified diet.   Discharge Diagnoses:  Chronic nonhealing leg ulcer with cellulitis Diabetes mellitus, type II Hypothyroidism History of CVA Tobacco abuse Chronic diastolic heart failure Depression  Discharge Condition: Stable  Diet recommendation: heart healthy/carb modified  Filed Weights   12/06/17 0552  Weight: 120.1 kg (264 lb 12.4 oz)    History of present illness:  On 12/03/2017 by Dr. Algis Downs Eric Form Wrightis a 66 y.o.malewith medical history significant forpoorly controlled diabetes with secondary neuropathy, history of CVA and chronic left leg ulcer who was started on p.o. antibiotics about a week ago for cellulitis surrounding this ulcer. When patient followed up today, erythema persistent with no improvement and continued pain so he was sent over to the emergency room. Patient's pain in that leg, specifically the area surrounding the ulcer has become painful in the past week without any improvement, even after starting antibiotics.  Hospital Course:  Chronic nonhealing leg ulcer with cellulitis -failed outpatient antibiotics, Bactrim DS given on 4/17 for 10days, followed by Cefdinir on  4/24 for 10 days -presented with worsening erythema and pain- which are now improving -placed on vancomycin and zosyn -cultures from 11/19/2017 showed resistant staph aureus and pseudomas aeruginosa  -blood cultures show no growth to date -MRI right tibia/fibula: Soft tissue  ulceration along the distal right leg with underlying soft tissue edema.  No drainable fluid collection. Mild periostitis of the distal fibular diaphysis without conclusive evidence of osteomyelitis -Wound care consulted: Cleanse wound to right leg with mild soap and water.  Cover nonintact lesion with acticoat flex.  Top with hydrogel and ABD pad.  Wrap from ankle to below knee with kerlix and secure with self adherent Coban.  Acticoat is to be changed on Tuesday and Friday but change ABD, kerlix and Coban daily.   -Given recent cultures from April 2019, will discharge patient with amoxicllin 500mg  TID, cipro 500mg  BID -advised patient to take probioitics   Diabetes mellitus, type II -hemoglobin A1c 7.3 -Continue Lantus, insulin sliding scale, CBG monitoring  Hypothyroidism -continue synthroid   History of CVA -continue aspirin, plavix, statin  Tobacco abuse -counseled -patient states he is down to a 1/4 ppd -Continue nicotine patch  Chronic diastolic heart failure -Currently compensated, continue to monitor intake and output, daily weights  Depression -Continue effexor  Procedures: None  Consultations: None  Discharge Exam: Vitals:   12/06/17 0552 12/06/17 0636  BP: 116/72   Pulse: 85   Resp: (!) 21   Temp: 98 F (36.7 C)   SpO2: (!) 78% 94%   Patient feeling much better today.  Would like to go home.  Denies any current chest pain, shortness breath, abdominal pain, nausea or vomiting, diarrhea or constipation.   General: Well developed, well nourished, NAD, appears stated age  HEENT: NCAT, mucous membranes moist.  Neck: Supple  Cardiovascular: S1 S2 auscultated, 2/6SEM, RRR  Respiratory: Diffuse expiratory wheezing  Abdomen: Soft, nontender, nondistended, + bowel sounds  Extremities: warm dry without cyanosis clubbing. Dressing in place on RLE. +LE edema B/L  Neuro: AAOx3, nonfocal  Psych: Normal affect and demeanor, pleasant  Discharge  Instructions Discharge Instructions    Discharge instructions   Complete by:  As directed    Continue follow up with the wound care clinic.  Patient will need to follow up with primary care provider within one week of discharge.  Patient should continue medications as prescribed.  Patient should follow a heart healthy/carb modified diet.     Allergies as of 12/06/2017   No Known Allergies     Medication List    STOP taking these medications   cefdinir 300 MG capsule Commonly known as:  OMNICEF     TAKE these medications   albuterol 108 (90 Base) MCG/ACT inhaler Commonly known as:  PROVENTIL HFA;VENTOLIN HFA INHALE 2 PUFFS INTO THE LUNGS EVERY 6 HOURS AS NEEDED FOR WHEEZE OR SHORTNESS OF BREATH   amoxicillin 500 MG capsule Commonly known as:  AMOXIL Take 1 capsule (500 mg total) by mouth 3 (three) times daily for 10 days.   atorvastatin 10 MG tablet Commonly known as:  LIPITOR Take 5 mg by mouth at bedtime. Takes 5 mg   busPIRone 10 MG tablet Commonly known as:  BUSPAR Take 0.5 tablets (5 mg total) by mouth 2 (two) times daily as needed (for anxiety). What changed:    how much to take  when to take this   ciprofloxacin 500 MG tablet Commonly known as:  CIPRO Take 1 tablet (500 mg total) by mouth 2 (two) times daily for 10 days.   clopidogrel 75 MG tablet Commonly known as:  PLAVIX Take 1 tablet (75 mg total) by mouth daily with breakfast.   gabapentin 300 MG capsule Commonly known as:  NEURONTIN Take 1 capsule (300 mg total) by mouth 3 (three) times daily.   insulin glargine 100 UNIT/ML injection Commonly known as:  LANTUS Inject 40 Units into the skin 2 (two) times daily. s   levothyroxine 125 MCG tablet Commonly known as:  SYNTHROID, LEVOTHROID Take 1 tablet (125 mcg total) by mouth daily before breakfast.   metFORMIN 500 MG tablet Commonly known as:  GLUCOPHAGE Take 500 mg by mouth 2 (two) times daily with a meal.   morphine 100 MG 12 hr  tablet Commonly known as:  MS CONTIN Take 100 mg by mouth every 12 (twelve) hours as needed for pain.   naloxegol oxalate 25 MG Tabs tablet Commonly known as:  MOVANTIK Take 1 tablet (25 mg total) by mouth daily.   nicotine 7 mg/24hr patch Commonly known as:  NICODERM CQ - dosed in mg/24 hr Place 1 patch (7 mg total) onto the skin daily. Start taking on:  12/07/2017   Pen Needles 5/16" 30G X 8 MM Misc Use with insulin pen daily DX:E11.9   RABEprazole 20 MG tablet Commonly known as:  ACIPHEX Take 2 tablets (40 mg total) by mouth daily before breakfast.   tamsulosin 0.4 MG Caps capsule Commonly known as:  FLOMAX Take 0.4 mg by mouth at bedtime.   testosterone cypionate 200 MG/ML injection Commonly known as:  DEPOTESTOSTERONE CYPIONATE Inject 1 mL (200 mg total) into the muscle every 14 (fourteen) days.   traZODone 100 MG tablet Commonly known as:  DESYREL Take 3 tablets (300 mg total) by mouth at bedtime.   venlafaxine XR 150 MG 24 hr capsule Commonly known as:  EFFEXOR-XR Take 1 capsule (150 mg total) by mouth daily with breakfast. What changed:    how to take this  when to take this  No Known Allergies Follow-up Information    [name], MD.   Specialty:  [facility] Contact information: [address] [place] [address] [phone]            The results of significant diagnostics from this hospitalization (including imaging, microbiology, ancillary and laboratory) are listed below for reference.    Significant Diagnostic Studies: Dg Tibia/fibula Right  Result Date: 12/03/2017 CLINICAL DATA:  Lateral distal right lower extremity chronic ulcer. Clinical concern for osteomyelitis. EXAM: RIGHT TIBIA AND FIBULA - 2 VIEW COMPARISON:  None. FINDINGS: Large soft tissue ulcer is noted anterolateral to the distal right fibular shaft. There are tiny cortical erosions along the lateral distal right fibular shaft. There is associated  irregular mild periosteal reaction throughout the distal right fibular shaft. Mild smooth periosteal reaction is present in the mid right femoral shaft laterally. No suspicious focal osseous lesions. No fractures. No radiopaque foreign body. No appreciable soft tissue gas. IMPRESSION: Large soft tissue ulcer anterolateral to the distal right fibular shaft. Radiographic evidence of acute/subacute osteomyelitis in the distal right fibular shaft including tiny cortical erosions and irregular periosteal reaction. Smooth periosteal reaction more proximally in the mid right femoral shaft is nonspecific with osteomyelitis not excluded. Electronically Signed   By: Ilona Sorrel M.D.   On: 12/03/2017 10:43   Mr Tibia Fibula Right Wo Contrast  Result Date: 12/03/2017 CLINICAL DATA:  Non healing soft tissue wound of the right tibia and fibula x2 years. Right leg pain and swelling with drainage. EXAM: MRI OF LOWER RIGHT EXTREMITY WITHOUT CONTRAST TECHNIQUE: Multiplanar, multisequence MR imaging of the right leg was performed. No intravenous contrast was administered. COMPARISON:  Same day radiographs of the right leg FINDINGS: The patient refused contrast per technologist's due to pain. Bones/Joint/Cartilage No acute fracture or frank bone destruction. Mild reactive periostitis of the distal fibular diaphysis is suggested with surrounding soft tissue edema. Ligaments Noncontributory Muscles and Tendons No intramuscular fluid collection or abscess. Generalized muscular atrophy of both legs. The tendons crossing the ankle joint are of normal signal intensity morphology. Soft tissues Soft tissue ulcerations along the lateral as well as anterolateral and posterolateral aspect of the distal right leg. IMPRESSION: Soft tissue ulcerations along the distal right leg with underlying soft tissue edema. No drainable fluid collections. Mild periostitis of the distal fibular diaphysis without conclusive evidence for osteomyelitis.  Electronically Signed   By: Ashley Royalty M.D.   On: 12/03/2017 20:57    Microbiology: Recent Results (from the past 240 hour(s))  Blood culture (routine x 2)     Status: None (Preliminary result)   Collection Time: 12/03/17 10:07 AM  Result Value Ref Range Status   Specimen Description   Final    BLOOD RIGHT ANTECUBITAL Performed at [facility] [address], [place], [place] [address]    Special Requests   Final    BOTTLES DRAWN AEROBIC AND ANAEROBIC Blood Culture results may not be optimal due to an excessive volume of blood received in culture bottles Performed at [facility] [address] [place], [place] [address]    Culture   Final    NO GROWTH 2 DAYS Performed at [facility] [address], [place], [place] [address]    Report Status PENDING  Incomplete  Blood culture (routine x 2)     Status: None (Preliminary result)   Collection Time: 12/03/17 10:31 AM  Result Value Ref Range Status   Specimen  Description   Final    BLOOD RIGHT WRIST Performed at [facility] [address], [place], [place] [address]    Special Requests   Final    BOTTLES DRAWN AEROBIC AND ANAEROBIC Blood Culture adequate volume Performed at [facility] [address], [place], [place] [address]    Culture   Final    NO GROWTH 2 DAYS Performed at [facility] [address], [place], [place] [address]    Report Status PENDING  Incomplete     Labs: Basic Metabolic Panel: Recent Labs  Lab 12/03/17 1007 12/04/17 0613 12/06/17 0605  NA 137 141  --   K 4.7 4.3  --   CL 99* 101  --   CO2 28 31  --   GLUCOSE 197* 77  --   BUN 12 12  --   CREATININE 0.75 0.79 0.97  CALCIUM 9.0 8.9  --    Liver Function Tests: No results for input(s): AST, ALT, ALKPHOS, BILITOT, PROT, ALBUMIN in the last 168 hours. No results for input(s): LIPASE, AMYLASE in the last 168 hours. No results for input(s):  AMMONIA in the last 168 hours. CBC: Recent Labs  Lab 12/03/17 1007 12/04/17 0613  WBC 10.8* 8.2  HGB 14.7 14.1  HCT 45.4 45.1  MCV 92.7 93.6  PLT 231 235   Cardiac Enzymes: No results for input(s): CKTOTAL, CKMB, CKMBINDEX, TROPONINI in the last 168 hours. BNP: BNP (last 3 results) Recent Labs    12/03/17 1007  BNP 61.9    ProBNP (last 3 results) No results for input(s): PROBNP in the last 8760 hours.  CBG: Recent Labs  Lab 12/05/17 0731 12/05/17 1217 12/05/17 1638 12/05/17 2152 12/06/17 0739  GLUCAP 80 99 179* 87 76       Signed:  Quantay Zaremba  [facility] 12/06/2017, 11:20 AM

## 2017-12-06 NOTE — Discharge Instructions (Signed)

## 2017-12-06 NOTE — Progress Notes (Signed)
Pt discharged to home. DC instructions given with wife at bedside. Pt had not concerns. Pt anxiously awaited going home that he was found at nursing station asking to leave. After departure, call received from [facility] about patient's prescriptions where 2 medications (cipro and movantik) ordered had drug interactions. Per pharmacist, the cipro potentially increased the level of movantik in the body precipitating opioid withdrawal symptoms. Dr. Ree Kida made aware. Per MD, pt should hold movantik until he was done taking the cipro. Pharmacist at [facility] made aware.  Hale Bogus.

## 2017-12-08 LAB — CULTURE, BLOOD (ROUTINE X 2)
CULTURE: NO GROWTH
Culture: NO GROWTH
SPECIAL REQUESTS: ADEQUATE

## 2017-12-16 ENCOUNTER — Encounter: Payer: Self-pay | Admitting: Pulmonary Disease

## 2017-12-16 ENCOUNTER — Ambulatory Visit: Payer: Medicare HMO | Admitting: Pulmonary Disease

## 2017-12-16 ENCOUNTER — Telehealth: Payer: Self-pay | Admitting: Family Medicine

## 2017-12-16 VITALS — BP 122/72 | HR 52 | Ht 72.0 in | Wt 258.0 lb

## 2017-12-16 DIAGNOSIS — Q321 Other congenital malformations of trachea: Secondary | ICD-10-CM

## 2017-12-16 DIAGNOSIS — J441 Chronic obstructive pulmonary disease with (acute) exacerbation: Secondary | ICD-10-CM

## 2017-12-16 DIAGNOSIS — J449 Chronic obstructive pulmonary disease, unspecified: Secondary | ICD-10-CM

## 2017-12-16 MED ORDER — ALBUTEROL SULFATE HFA 108 (90 BASE) MCG/ACT IN AERS: INHALATION_SPRAY | RESPIRATORY_TRACT | 3 refills | Status: AC

## 2017-12-16 MED ORDER — UMECLIDINIUM-VILANTEROL 62.5-25 MCG/INH IN AEPB: 1.0000 | INHALATION_SPRAY | Freq: Every day | RESPIRATORY_TRACT | 0 refills | Status: AC

## 2017-12-16 MED ORDER — UMECLIDINIUM-VILANTEROL 62.5-25 MCG/INH IN AEPB: 1.0000 | INHALATION_SPRAY | Freq: Every day | RESPIRATORY_TRACT | 5 refills | Status: DC

## 2017-12-16 NOTE — Progress Notes (Signed)
Brad Taylor    [id number]    [birth year]  Primary Care Physician:Pickard, Cammie Mcgee, MD  Referring Physician: [name], MD [address] [place], [place] [address]  Chief complaint: Consult for abnormal CT scan, COPD  HPI: 67 year old with history of COPD, right upper lobe nodule status post lobectomy, diabetes He was seen at Dr. Samella Parr office in March for bronchitis with antibiotics.  Chest x-ray and CT showed right perihilar scarring, right upper lobectomy changes and polypoid lesion in the trachea.  He has been referred here for further evaluation  Dyspnea with exertion, productive cough with white mucus, wheezing.  He is only on albuterol inhaler.  Which he uses several times a day.  He has history of right upper lobe nodule status post lobectomy in 2016 at the [place].    Pets: No pets Occupation: Used to work as a Dealer Exposures: Exposure to asbestos.  No mold, hot tub, Jacuzzi Smoking history: 30-pack-year smoker.  Continues to smoke 1 pack/day Travel history: Grew up in [place].  Lived in [place].  No other significant travel Relevant family history: No significant family history of lung disease.  Outpatient Encounter Medications as of 12/16/2017  Medication Sig  . albuterol (PROVENTIL HFA;VENTOLIN HFA) 108 (90 Base) MCG/ACT inhaler INHALE 2 PUFFS INTO THE LUNGS EVERY 6 HOURS AS NEEDED FOR WHEEZE OR SHORTNESS OF BREATH  . amoxicillin (AMOXIL) 500 MG capsule Take 1 capsule (500 mg total) by mouth 3 (three) times daily for 10 days.  Marland Kitchen atorvastatin (LIPITOR) 10 MG tablet Take 5 mg by mouth at bedtime. Takes 5 mg   . busPIRone (BUSPAR) 10 MG tablet Take 0.5 tablets (5 mg total) by mouth 2 (two) times daily as needed (for anxiety). (Patient taking differently: Take 10 mg by mouth 2 (two) times daily. )  . clopidogrel (PLAVIX) 75 MG tablet Take 1 tablet (75 mg total) by mouth daily with breakfast.  . gabapentin (NEURONTIN) 300 MG capsule Take 1  capsule (300 mg total) by mouth 3 (three) times daily.  . insulin glargine (LANTUS) 100 UNIT/ML injection Inject 40 Units into the skin 2 (two) times daily. s  . Insulin Pen Needle (PEN NEEDLES 5/16") 30G X 8 MM MISC Use with insulin pen daily DX:E11.9  . levothyroxine (SYNTHROID, LEVOTHROID) 125 MCG tablet Take 1 tablet (125 mcg total) by mouth daily before breakfast.  . metFORMIN (GLUCOPHAGE) 500 MG tablet Take 500 mg by mouth 2 (two) times daily with a meal.  . morphine (MS CONTIN) 100 MG 12 hr tablet Take 100 mg by mouth every 12 (twelve) hours as needed for pain.   . naloxegol oxalate (MOVANTIK) 25 MG TABS tablet Take 1 tablet (25 mg total) by mouth daily.  . RABEprazole (ACIPHEX) 20 MG tablet Take 2 tablets (40 mg total) by mouth daily before breakfast.  . tamsulosin (FLOMAX) 0.4 MG CAPS capsule Take 0.4 mg by mouth at bedtime.   Marland Kitchen testosterone cypionate (DEPOTESTOSTERONE CYPIONATE) 200 MG/ML injection Inject 1 mL (200 mg total) into the muscle every 14 (fourteen) days.  . traZODone (DESYREL) 100 MG tablet Take 3 tablets (300 mg total) by mouth at bedtime.  Marland Kitchen venlafaxine XR (EFFEXOR-XR) 150 MG 24 hr capsule Take 1 capsule (150 mg total) by mouth daily with breakfast. (Patient taking differently: 150 mg 2 (two) times daily. )  . [DISCONTINUED] ciprofloxacin (CIPRO) 500 MG tablet Take 1 tablet (500 mg total) by mouth 2 (two) times  daily for 10 days.  . [DISCONTINUED] nicotine (NICODERM CQ - DOSED IN MG/24 HR) 7 mg/24hr patch Place 1 patch (7 mg total) onto the skin daily.   No facility-administered encounter medications on file as of 12/16/2017.     Allergies as of 12/16/2017  . (No Known Allergies)    Past Medical History:  Diagnosis Date  . AAA (abdominal aortic aneurysm) ([place]) 5/15   3.5x3.5 cm  . COPD (chronic obstructive pulmonary disease) ([place])   . Depression   . Diabetes mellitus without complication ([place])   . GERD (gastroesophageal reflux disease)   . Hyperlipidemia   .  Hypothyroidism   . Post traumatic stress disorder   . Stroke ([place])   . Thyroid disease   . Ulcer     Past Surgical History:  Procedure Laterality Date  . ABDOMINAL AORTOGRAM W/LOWER EXTREMITY N/A 08/20/2017   Procedure: ABDOMINAL AORTOGRAM W/LOWER EXTREMITY;  Surgeon: Waynetta Sandy, MD;  Location: [facility];  Service: Cardiovascular;  Laterality: N/A;  . CATARACT EXTRACTION    . CHOLECYSTECTOMY    . KNEE SURGERY    . LUNG REMOVAL, PARTIAL     right, 2 [facility] for [facility]  . PERIPHERAL VASCULAR ATHERECTOMY Right 08/20/2017   Procedure: PERIPHERAL VASCULAR ATHERECTOMY;  Surgeon: Waynetta Sandy, MD;  Location: [facility];  Service: Cardiovascular;  Laterality: Right;  posterior tibial and tibeoperoneal trunk  . PERIPHERAL VASCULAR BALLOON ANGIOPLASTY Right 08/20/2017   Procedure: PERIPHERAL VASCULAR BALLOON ANGIOPLASTY;  Surgeon: Waynetta Sandy, MD;  Location: [facility];  Service: Cardiovascular;  Laterality: Right;  Anterior tibial    Family History  Problem Relation Age of Onset  . Emphysema Father   . Thyroid disease Sister   . Stroke Maternal Grandmother   . Thyroid disease Sister   . Thyroid disease Sister     Social History   Socioeconomic History  . Marital status: Married    Spouse name: Diane  . Number of children: 1  . Years of education: HS  . Highest education level: Not on file  Occupational History  . Occupation: Retired  Scientific laboratory technician  . Financial resource strain: Not on file  . Food insecurity:    Worry: Not on file    Inability: Not on file  . Transportation needs:    Medical: Not on file    Non-medical: Not on file  Tobacco Use  . Smoking status: Current Every Day Smoker    Packs/day: 1.00    Years: 30.00    Pack years: 30.00    Types: Cigarettes  . Smokeless tobacco: Former Systems developer    Quit date: 05/04/2013  . Tobacco comment: Less than 1/2 pk per day  Substance and Sexual Activity  .  Alcohol use: No    Comment: Former heavy drinker, been sober for 10 years  . Drug use: No  . Sexual activity: Not on file  Lifestyle  . Physical activity:    Days per week: Not on file    Minutes per session: Not on file  . Stress: Not on file  Relationships  . Social connections:    Talks on phone: Not on file    Gets together: Not on file    Attends religious service: Not on file    Active member of club or organization: Not on file    Attends meetings of clubs or organizations: Not on file    Relationship status: Not on file  .  Intimate partner violence:    Fear of current or ex partner: Not on file    Emotionally abused: Not on file    Physically abused: Not on file    Forced sexual activity: Not on file  Other Topics Concern  . Not on file  Social History Narrative   Patient lives at home with spouse.   Caffeine Use: 3-4 cups daily   Retired Dealer.    Review of systems: Review of Systems  Constitutional: Negative for fever and chills.  HENT: Negative.   Eyes: Negative for blurred vision.  Respiratory: as per HPI  Cardiovascular: Negative for chest pain and palpitations.  Gastrointestinal: Negative for vomiting, diarrhea, blood per rectum. Genitourinary: Negative for dysuria, urgency, frequency and hematuria.  Musculoskeletal: Negative for myalgias, back pain and joint pain.  Skin: Negative for itching and rash.  Neurological: Negative for dizziness, tremors, focal weakness, seizures and loss of consciousness.  Endo/Heme/Allergies: Negative for environmental allergies.  Psychiatric/Behavioral: Negative for depression, suicidal ideas and hallucinations.  All other systems reviewed and are negative.  Physical Exam: Blood pressure 122/72, pulse (!) 52, height 6' (1.829 m), weight 258 lb (117 kg), SpO2 96 %. Gen:      No acute distress HEENT:  EOMI, sclera anicteric Neck:     No masses; no thyromegaly Lungs:    Clear to auscultation bilaterally; normal  respiratory effort CV:         Regular rate and rhythm; no murmurs Abd:      + bowel sounds; soft, non-tender; no palpable masses, no distension Ext:    No edema; adequate peripheral perfusion Skin:      Warm and dry; no rash Neuro: alert and oriented x 3 Psych: normal mood and affect  Data Reviewed: Chest x-ray/1/19-volume loss in the right chest with the right hemidiaphragm elevation.  Right perihilar opacity. CT chest 10/28/2017- nodular opacity in the lower trachea.  Scarring from right upper lobectomy with volume loss.  Moderate thoracic atherosclerosis, coronary artery calcification, mild cardiomegaly.  I have reviewed the images personally  CBC 11/03/2017-WBC 13.2, eos 2%, absolute eosinophil count 260.  Assessment:  Abnormal CT scan CT scan reviewed which shows chronic postsurgical changes in the right lung.  There are luminal opacities in the trachea which is likely mucus.  However given his history of lung nodule and smoking we will evaluate this with bronchoscopy. Risk benefits of procedure discussed with the patient and he is agreeable to the test.  COPD He is currently just on albuterol.  Start anoro as he has significant symptoms of dyspnea Schedule pulmonary function test  Plan/Recommendations: - Schedule bronchoscopy - Start Anoro, check PFTs  More then 1/2 the time of the 40 min visit was spent in counseling and/or coordination of care with the patient and family.  Marshell Garfinkel MD [facility] 12/16/2017, 2:33 PM  CC: [name], MD

## 2017-12-16 NOTE — Telephone Encounter (Signed)
If his hot flashes are better on gabapentin I believe we can stop testosterone.  If the hot flashes return off the testosterone we can always resume it.

## 2017-12-16 NOTE — Patient Instructions (Signed)
Schedule you for a bronchoscope for evaluation of your abnormal CT scan and possible lesion in the pipe We will schedule you for pulmonary function test.  Start Anoro inhaler Continue albuterol as needed.  We will give you a prescription for these Follow-up in 1 to 2 months.

## 2017-12-16 NOTE — Telephone Encounter (Signed)
Pt's wife called and wanted to know if pt needed to continue taking testosterone?

## 2017-12-17 DIAGNOSIS — B965 Pseudomonas (aeruginosa) (mallei) (pseudomallei) as the cause of diseases classified elsewhere: Secondary | ICD-10-CM | POA: Diagnosis not present

## 2017-12-17 DIAGNOSIS — L03115 Cellulitis of right lower limb: Secondary | ICD-10-CM | POA: Diagnosis not present

## 2017-12-17 DIAGNOSIS — L97812 Non-pressure chronic ulcer of other part of right lower leg with fat layer exposed: Secondary | ICD-10-CM | POA: Diagnosis not present

## 2017-12-17 DIAGNOSIS — I69320 Aphasia following cerebral infarction: Secondary | ICD-10-CM | POA: Diagnosis not present

## 2017-12-17 DIAGNOSIS — F17210 Nicotine dependence, cigarettes, uncomplicated: Secondary | ICD-10-CM | POA: Diagnosis not present

## 2017-12-17 DIAGNOSIS — E11622 Type 2 diabetes mellitus with other skin ulcer: Secondary | ICD-10-CM | POA: Diagnosis not present

## 2017-12-17 DIAGNOSIS — I69959 Hemiplegia and hemiparesis following unspecified cerebrovascular disease affecting unspecified side: Secondary | ICD-10-CM | POA: Diagnosis not present

## 2017-12-17 NOTE — Telephone Encounter (Signed)
pts wife aware of recommendations and states that the Gabapentin is working well and will stop testosterone.

## 2017-12-26 ENCOUNTER — Telehealth: Payer: Self-pay | Admitting: Pulmonary Disease

## 2017-12-26 NOTE — Telephone Encounter (Signed)
Left message for patient to call back 12/26/17.

## 2017-12-30 ENCOUNTER — Ambulatory Visit (HOSPITAL_COMMUNITY)
Admission: RE | Admit: 2017-12-30 | Discharge: 2017-12-30 | Disposition: A | Discharge status: Home / Self Care | Payer: Medicare HMO | Source: Ambulatory Visit | Attending: Pulmonary Disease | Admitting: Pulmonary Disease

## 2017-12-30 ENCOUNTER — Encounter (HOSPITAL_COMMUNITY): Payer: Self-pay | Admitting: Respiratory Therapy

## 2017-12-30 ENCOUNTER — Encounter (HOSPITAL_COMMUNITY)
Admission: RE | Disposition: A | Discharge status: Home / Self Care | Payer: Self-pay | Source: Ambulatory Visit | Attending: Pulmonary Disease

## 2017-12-30 DIAGNOSIS — K219 Gastro-esophageal reflux disease without esophagitis: Secondary | ICD-10-CM | POA: Insufficient documentation

## 2017-12-30 DIAGNOSIS — E039 Hypothyroidism, unspecified: Secondary | ICD-10-CM | POA: Diagnosis not present

## 2017-12-30 DIAGNOSIS — Z8673 Personal history of transient ischemic attack (TIA), and cerebral infarction without residual deficits: Secondary | ICD-10-CM | POA: Insufficient documentation

## 2017-12-30 DIAGNOSIS — F329 Major depressive disorder, single episode, unspecified: Secondary | ICD-10-CM | POA: Insufficient documentation

## 2017-12-30 DIAGNOSIS — Z7902 Long term (current) use of antithrombotics/antiplatelets: Secondary | ICD-10-CM | POA: Insufficient documentation

## 2017-12-30 DIAGNOSIS — J398 Other specified diseases of upper respiratory tract: Secondary | ICD-10-CM

## 2017-12-30 DIAGNOSIS — Z79899 Other long term (current) drug therapy: Secondary | ICD-10-CM | POA: Insufficient documentation

## 2017-12-30 DIAGNOSIS — Z825 Family history of asthma and other chronic lower respiratory diseases: Secondary | ICD-10-CM | POA: Insufficient documentation

## 2017-12-30 DIAGNOSIS — Z902 Acquired absence of lung [part of]: Secondary | ICD-10-CM | POA: Diagnosis not present

## 2017-12-30 DIAGNOSIS — R9389 Abnormal findings on diagnostic imaging of other specified body structures: Secondary | ICD-10-CM | POA: Insufficient documentation

## 2017-12-30 DIAGNOSIS — E119 Type 2 diabetes mellitus without complications: Secondary | ICD-10-CM | POA: Insufficient documentation

## 2017-12-30 DIAGNOSIS — Z79890 Hormone replacement therapy (postmenopausal): Secondary | ICD-10-CM | POA: Insufficient documentation

## 2017-12-30 DIAGNOSIS — J449 Chronic obstructive pulmonary disease, unspecified: Secondary | ICD-10-CM | POA: Insufficient documentation

## 2017-12-30 DIAGNOSIS — F17210 Nicotine dependence, cigarettes, uncomplicated: Secondary | ICD-10-CM | POA: Insufficient documentation

## 2017-12-30 DIAGNOSIS — Z7984 Long term (current) use of oral hypoglycemic drugs: Secondary | ICD-10-CM | POA: Diagnosis not present

## 2017-12-30 HISTORY — PX: VIDEO BRONCHOSCOPY: SHX5072

## 2017-12-30 SURGERY — VIDEO BRONCHOSCOPY WITHOUT FLUORO
Anesthesia: Moderate Sedation | Laterality: Bilateral

## 2017-12-30 MED ORDER — PHENYLEPHRINE HCL 0.25 % NA SOLN
NASAL | Status: DC | PRN
  Administered 2017-12-30: 2 via NASAL

## 2017-12-30 MED ORDER — BUTAMBEN-TETRACAINE-BENZOCAINE 2-2-14 % EX AERO: 1.0000 | INHALATION_SPRAY | Freq: Once | CUTANEOUS | Status: DC

## 2017-12-30 MED ORDER — FENTANYL CITRATE (PF) 100 MCG/2ML IJ SOLN
INTRAMUSCULAR | Status: AC
  Filled 2017-12-30: qty 4

## 2017-12-30 MED ORDER — MIDAZOLAM HCL 10 MG/2ML IJ SOLN
INTRAMUSCULAR | Status: DC | PRN
  Administered 2017-12-30: 1 mg via INTRAVENOUS

## 2017-12-30 MED ORDER — PHENYLEPHRINE HCL 0.25 % NA SOLN: 1.0000 | Freq: Four times a day (QID) | NASAL | Status: DC | PRN

## 2017-12-30 MED ORDER — MIDAZOLAM HCL 5 MG/ML IJ SOLN
INTRAMUSCULAR | Status: AC
  Filled 2017-12-30: qty 2

## 2017-12-30 MED ORDER — SODIUM CHLORIDE 0.9 % IV SOLN
INTRAVENOUS | Status: DC
  Administered 2017-12-30: 07:00:00 via INTRAVENOUS

## 2017-12-30 MED ORDER — FENTANYL CITRATE (PF) 100 MCG/2ML IJ SOLN
INTRAMUSCULAR | Status: DC | PRN
  Administered 2017-12-30: 25 ug via INTRAVENOUS

## 2017-12-30 MED ORDER — LIDOCAINE HCL 2 % EX GEL: 1.0000 "application " | Freq: Once | CUTANEOUS | Status: DC

## 2017-12-30 MED ORDER — LIDOCAINE HCL URETHRAL/MUCOSAL 2 % EX GEL
CUTANEOUS | Status: DC | PRN
  Administered 2017-12-30: 1

## 2017-12-30 MED ORDER — LIDOCAINE HCL (PF) 1 % IJ SOLN
INTRAMUSCULAR | Status: DC | PRN
  Administered 2017-12-30: 6 mL

## 2017-12-30 NOTE — Discharge Instructions (Signed)
Flexible Bronchoscopy, Care After These instructions give you information on caring for yourself after your procedure. Your doctor may also give you more specific instructions. Call your doctor if you have any problems or questions after your procedure. Follow these instructions at home:  Do not eat or drink anything for 2 hours after your procedure. If you try to eat or drink before the medicine wears off, food or drink could go into your lungs. You could also burn yourself.  After 2 hours have passed and when you can cough and gag normally, you may eat soft food and drink liquids slowly.  The day after the test, you may eat your normal diet.  You may do your normal activities.  Keep all doctor visits. Get help right away if:  You get more and more short of breath.  You get light-headed.  You feel like you are going to pass out (faint).  You have chest pain.  You have new problems that worry you.  You cough up more than a little blood.  You cough up more blood than before.   Do not eat or drink anything until 10:00 am on 12/30/2017.   This information is not intended to replace advice given to you by your health care provider. Make sure you discuss any questions you have with your health care provider. Document Released: 05/19/2009 Document Revised: 12/28/2015 Document Reviewed: 03/26/2013 Elsevier Interactive Patient Education  2017 [employer].

## 2017-12-30 NOTE — Progress Notes (Signed)
Video bronchoscopy performed.  No interventions performed.  Airway inspection only.  No labs obtained.  Patient tolerated well.  Will continue to monitor.

## 2017-12-30 NOTE — Telephone Encounter (Signed)
ATC pt, no answer. Left message for pt to call back.  

## 2017-12-30 NOTE — Op Note (Signed)
[facility] Cardiopulmonary Patient Name: Brad Taylor Pocedure Date: 12/30/2017 MRN: [id number] Attending MD: Marshell Garfinkel , MD Date of Birth: [birth year] CSN: Finalized Age: 67 Admit Type: Outpatient Gender: Male Procedure:            Bronchoscopy Indications:          Evaluate for Tracheal mass Providers:            Marshell Garfinkel, MD, Cherre Huger RRT, RCP, Phillis Knack                        RRT, RCP Referring MD:          Medicines:            Midazolam 1 mg IV, Fentanyl 25 mcg IV Complications:        No immediate complications Estimated Blood Loss: Estimated blood loss: none. Procedure:            Pre-Anesthesia Assessment:                       - A History and Physical has been performed. Patient                        meds and allergies have been reviewed. The risks and                        benefits of the procedure and the sedation options and                        risks were discussed with the patient. All questions                        were answered and informed consent was obtained.                        Patient identification and proposed procedure were                        verified prior to the procedure in the pre-procedure                        area. Mental Status Examination: alert and oriented.                        Airway Examination: small/crowded oropharyngeal airway.                        Respiratory Examination: clear to auscultation. CV                        Examination: RRR, no murmurs, no S3 or S4. ASA Grade                        Assessment: II - A patient with mild systemic disease.                        After reviewing the risks and benefits, the patient was                        deemed in satisfactory condition to undergo the  procedure. The anesthesia plan was to use moderate                        sedation / analgesia (conscious sedation). Immediately                        prior to administration of  medications, the patient was                        re-assessed for adequacy to receive sedatives. The                        heart rate, respiratory rate, oxygen saturations, blood                        pressure, adequacy of pulmonary ventilation, and                        response to care were monitored throughout the                        procedure. The physical status of the patient was                        re-assessed after the procedure.                       - Prior to the procedure, a History and Physical was                        performed, and patient medications and allergies were                        reviewed. The patient's tolerance of previous                        anesthesia was also reviewed. The risks and benefits of                        the procedure and the sedation options and risks were                        discussed with the patient. All questions were                        answered, and informed consent was obtained.                        Anticoagulants: The patient has taken Plavix                        (clopidogrel). After discussion with the patient's                        managing physician, it was decided not to withhold this                        medication prior to procedure. ASA Grade Assessment: II                        -  A patient with mild systemic disease. After reviewing                        the risks and benefits, the patient was deemed in                        satisfactory condition to undergo the procedure.                       After obtaining informed consent, the bronchoscope was                        passed under direct vision. Throughout the procedure,                        the patient's blood pressure, pulse, and oxygen                        saturations were monitored continuously. the [id number]                        [id number] scope was introduced through the right nostril                        and advanced to the  tracheobronchial tree of both lungs. Scope In: 7:52:46 AM Scope Out: 8:24:23 AM Findings:      The oropharynx appears normal. The larynx appears normal. The vocal       cords appear normal. The subglottic space is normal. The trachea is of       normal caliber. The carina is sharp. The tracheobronchial tree was       examined to at least the first subsegmental level. Bronchial mucosa and       anatomy are normal; there are no endobronchial lesions.There was mild       deformity to the tracheal on right side and right upper lobe take off       was closed consistent with his known right upper lobectomy. Impression:           - No tracheal mass was seen                       - The airway examination showed post operative changes                        of right upper lobectomy with no worrying findings                       - No specimens collected. Moderate Sedation:      Moderate (conscious) sedation was administered by the nurse and       supervised by the physician performing the procedure. The following       parameters were monitored: oxygen saturation, heart rate, blood       pressure, and response to care. Total physician intraservice time was 15       minutes. Recommendation:       - Follow up in clinic as previously scheduled. Procedure Code(s):    --- Professional ---                       [phone], Bronchoscopy, rigid or flexible, including  fluoroscopic guidance, when performed; diagnostic, with                        cell washing, when performed (separate procedure)                       99152, Moderate sedation services provided by the same                        physician or other qualified health care professional                        performing the diagnostic or therapeutic service that                        the sedation supports, requiring the presence of an                        independent trained observer to assist in the                         monitoring of the patient's level of consciousness and                        physiological status; initial 15 minutes of                        intraservice time, patient age 20 years or older Diagnosis Code(s):    --- Professional ---                       R93.89, Abnormal findings on diagnostic imaging of                        other specified body structures CPT copyright 2017 American Medical Association. All rights reserved. The codes documented in this report are preliminary and upon coder review may  be revised to meet current compliance requirements. Marshell Garfinkel, MD 12/30/2017 8:16:15 AM Number of Addenda: 0

## 2017-12-30 NOTE — Telephone Encounter (Signed)
It looks like pt is checked in to have procedure at this time according to Epic.

## 2017-12-31 DIAGNOSIS — I69320 Aphasia following cerebral infarction: Secondary | ICD-10-CM | POA: Diagnosis not present

## 2017-12-31 DIAGNOSIS — E11622 Type 2 diabetes mellitus with other skin ulcer: Secondary | ICD-10-CM | POA: Diagnosis not present

## 2017-12-31 DIAGNOSIS — F17210 Nicotine dependence, cigarettes, uncomplicated: Secondary | ICD-10-CM | POA: Diagnosis not present

## 2017-12-31 DIAGNOSIS — I69959 Hemiplegia and hemiparesis following unspecified cerebrovascular disease affecting unspecified side: Secondary | ICD-10-CM | POA: Diagnosis not present

## 2017-12-31 DIAGNOSIS — I890 Lymphedema, not elsewhere classified: Secondary | ICD-10-CM | POA: Diagnosis not present

## 2017-12-31 DIAGNOSIS — L97812 Non-pressure chronic ulcer of other part of right lower leg with fat layer exposed: Secondary | ICD-10-CM | POA: Diagnosis not present

## 2017-12-31 NOTE — Telephone Encounter (Signed)
Per patient's chart, he had the bronch yesterday. Will close this encounter.

## 2018-01-14 ENCOUNTER — Encounter (HOSPITAL_BASED_OUTPATIENT_CLINIC_OR_DEPARTMENT_OTHER): Payer: Medicare HMO | Attending: Physician Assistant

## 2018-01-14 DIAGNOSIS — I69359 Hemiplegia and hemiparesis following cerebral infarction affecting unspecified side: Secondary | ICD-10-CM | POA: Insufficient documentation

## 2018-01-14 DIAGNOSIS — E11622 Type 2 diabetes mellitus with other skin ulcer: Secondary | ICD-10-CM | POA: Diagnosis not present

## 2018-01-14 DIAGNOSIS — I69320 Aphasia following cerebral infarction: Secondary | ICD-10-CM | POA: Diagnosis not present

## 2018-01-14 DIAGNOSIS — L97829 Non-pressure chronic ulcer of other part of left lower leg with unspecified severity: Secondary | ICD-10-CM | POA: Diagnosis not present

## 2018-01-14 DIAGNOSIS — F17210 Nicotine dependence, cigarettes, uncomplicated: Secondary | ICD-10-CM | POA: Diagnosis not present

## 2018-01-14 DIAGNOSIS — L97912 Non-pressure chronic ulcer of unspecified part of right lower leg with fat layer exposed: Secondary | ICD-10-CM | POA: Diagnosis not present

## 2018-01-14 DIAGNOSIS — J449 Chronic obstructive pulmonary disease, unspecified: Secondary | ICD-10-CM | POA: Diagnosis not present

## 2018-01-15 ENCOUNTER — Other Ambulatory Visit: Payer: Self-pay

## 2018-01-15 DIAGNOSIS — I739 Peripheral vascular disease, unspecified: Secondary | ICD-10-CM

## 2018-01-26 ENCOUNTER — Ambulatory Visit: Payer: Medicare HMO | Admitting: Vascular Surgery

## 2018-01-28 DIAGNOSIS — L97912 Non-pressure chronic ulcer of unspecified part of right lower leg with fat layer exposed: Secondary | ICD-10-CM | POA: Diagnosis not present

## 2018-01-28 DIAGNOSIS — J449 Chronic obstructive pulmonary disease, unspecified: Secondary | ICD-10-CM | POA: Diagnosis not present

## 2018-01-28 DIAGNOSIS — I69359 Hemiplegia and hemiparesis following cerebral infarction affecting unspecified side: Secondary | ICD-10-CM | POA: Diagnosis not present

## 2018-01-28 DIAGNOSIS — I69320 Aphasia following cerebral infarction: Secondary | ICD-10-CM | POA: Diagnosis not present

## 2018-01-28 DIAGNOSIS — L97829 Non-pressure chronic ulcer of other part of left lower leg with unspecified severity: Secondary | ICD-10-CM | POA: Diagnosis not present

## 2018-01-28 DIAGNOSIS — F17210 Nicotine dependence, cigarettes, uncomplicated: Secondary | ICD-10-CM | POA: Diagnosis not present

## 2018-01-28 DIAGNOSIS — E11622 Type 2 diabetes mellitus with other skin ulcer: Secondary | ICD-10-CM | POA: Diagnosis not present

## 2018-01-30 ENCOUNTER — Ambulatory Visit: Payer: Medicare HMO | Admitting: Pulmonary Disease

## 2018-02-06 ENCOUNTER — Ambulatory Visit: Payer: Medicare HMO | Admitting: Vascular Surgery

## 2018-02-11 ENCOUNTER — Encounter (HOSPITAL_BASED_OUTPATIENT_CLINIC_OR_DEPARTMENT_OTHER): Payer: Self-pay

## 2018-02-11 ENCOUNTER — Encounter (HOSPITAL_BASED_OUTPATIENT_CLINIC_OR_DEPARTMENT_OTHER): Payer: Medicare HMO | Attending: Physician Assistant

## 2018-02-11 DIAGNOSIS — J449 Chronic obstructive pulmonary disease, unspecified: Secondary | ICD-10-CM | POA: Insufficient documentation

## 2018-02-11 DIAGNOSIS — E11622 Type 2 diabetes mellitus with other skin ulcer: Secondary | ICD-10-CM | POA: Insufficient documentation

## 2018-02-11 DIAGNOSIS — I69320 Aphasia following cerebral infarction: Secondary | ICD-10-CM | POA: Insufficient documentation

## 2018-02-11 DIAGNOSIS — L97812 Non-pressure chronic ulcer of other part of right lower leg with fat layer exposed: Secondary | ICD-10-CM | POA: Insufficient documentation

## 2018-02-11 DIAGNOSIS — I69359 Hemiplegia and hemiparesis following cerebral infarction affecting unspecified side: Secondary | ICD-10-CM | POA: Insufficient documentation

## 2018-02-11 DIAGNOSIS — F17210 Nicotine dependence, cigarettes, uncomplicated: Secondary | ICD-10-CM | POA: Diagnosis not present

## 2018-02-11 DIAGNOSIS — L97919 Non-pressure chronic ulcer of unspecified part of right lower leg with unspecified severity: Secondary | ICD-10-CM | POA: Diagnosis not present

## 2018-02-11 DIAGNOSIS — L97822 Non-pressure chronic ulcer of other part of left lower leg with fat layer exposed: Secondary | ICD-10-CM | POA: Diagnosis not present

## 2018-02-11 DIAGNOSIS — I69959 Hemiplegia and hemiparesis following unspecified cerebrovascular disease affecting unspecified side: Secondary | ICD-10-CM | POA: Diagnosis not present

## 2018-02-12 ENCOUNTER — Other Ambulatory Visit: Payer: Self-pay

## 2018-02-12 DIAGNOSIS — I83893 Varicose veins of bilateral lower extremities with other complications: Secondary | ICD-10-CM

## 2018-02-12 DIAGNOSIS — I872 Venous insufficiency (chronic) (peripheral): Secondary | ICD-10-CM

## 2018-02-17 ENCOUNTER — Other Ambulatory Visit: Payer: Self-pay

## 2018-02-17 ENCOUNTER — Ambulatory Visit (INDEPENDENT_AMBULATORY_CARE_PROVIDER_SITE_OTHER): Payer: Medicare HMO | Admitting: Vascular Surgery

## 2018-02-17 ENCOUNTER — Ambulatory Visit (HOSPITAL_COMMUNITY)
Admission: RE | Admit: 2018-02-17 | Discharge: 2018-02-17 | Disposition: A | Discharge status: Home / Self Care | Payer: Medicare HMO | Source: Ambulatory Visit | Attending: Vascular Surgery | Admitting: Vascular Surgery

## 2018-02-17 ENCOUNTER — Encounter: Payer: Self-pay | Admitting: Vascular Surgery

## 2018-02-17 VITALS — BP 137/78 | HR 58 | Resp 20 | Ht 72.0 in | Wt 251.0 lb

## 2018-02-17 DIAGNOSIS — I83813 Varicose veins of bilateral lower extremities with pain: Secondary | ICD-10-CM | POA: Diagnosis not present

## 2018-02-17 DIAGNOSIS — I872 Venous insufficiency (chronic) (peripheral): Secondary | ICD-10-CM | POA: Diagnosis not present

## 2018-02-17 DIAGNOSIS — I83893 Varicose veins of bilateral lower extremities with other complications: Secondary | ICD-10-CM | POA: Diagnosis not present

## 2018-02-17 NOTE — Progress Notes (Signed)
Patient is a 67 year old male who returns for follow-up regarding venous and arterial ulcers right leg.  He previously underwent atherectomy of the right tibioperoneal trunk and posterior tibial artery with subsequent angioplasty by Dr. [name] January 2019.  He still has a very large stasis ulcer on the right leg and a smaller on the left leg.  He is being followed at the [facility] long wound center regarding this.  He states the ulcer is painful.  It has been there for several months.  He states it has improved slightly but is still only healed about 25% over the last few months.  Past Medical History:  Diagnosis Date  . AAA (abdominal aortic aneurysm) ([place]) 5/15   3.5x3.5 cm  . COPD (chronic obstructive pulmonary disease) ([place])   . Depression   . Diabetes mellitus without complication ([place])   . GERD (gastroesophageal reflux disease)   . Hyperlipidemia   . Hypothyroidism   . Post traumatic stress disorder   . Stroke ([place])   . Thyroid disease   . Ulcer     Past Surgical History:  Procedure Laterality Date  . ABDOMINAL AORTOGRAM W/LOWER EXTREMITY N/A 08/20/2017   Procedure: ABDOMINAL AORTOGRAM W/LOWER EXTREMITY;  Surgeon: Waynetta Sandy, MD;  Location: [place] [facility];  Service: Cardiovascular;  Laterality: N/A;  . CATARACT EXTRACTION    . CHOLECYSTECTOMY    . KNEE SURGERY    . LUNG REMOVAL, PARTIAL     right, 2 years ago at [facility] for cancer  . PERIPHERAL VASCULAR ATHERECTOMY Right 08/20/2017   Procedure: PERIPHERAL VASCULAR ATHERECTOMY;  Surgeon: Waynetta Sandy, MD;  Location: [place] [facility];  Service: Cardiovascular;  Laterality: Right;  posterior tibial and tibeoperoneal trunk  . PERIPHERAL VASCULAR BALLOON ANGIOPLASTY Right 08/20/2017   Procedure: PERIPHERAL VASCULAR BALLOON ANGIOPLASTY;  Surgeon: Waynetta Sandy, MD;  Location: [place] [facility];  Service: Cardiovascular;  Laterality: Right;  Anterior tibial  . VIDEO BRONCHOSCOPY Bilateral 12/30/2017   Procedure: VIDEO BRONCHOSCOPY WITHOUT FLUORO;  Surgeon: Marshell Garfinkel, MD;  Location: [place] [facility];  Service: Cardiopulmonary;  Laterality: Bilateral;   Current Outpatient Medications on File Prior to Visit  Medication Sig Dispense Refill  . albuterol (PROVENTIL HFA;VENTOLIN HFA) 108 (90 Base) MCG/ACT inhaler INHALE 2 PUFFS INTO THE LUNGS EVERY 6 HOURS AS NEEDED FOR WHEEZE OR SHORTNESS OF BREATH 18 g 3  . atorvastatin (LIPITOR) 10 MG tablet Take 5 mg by mouth at bedtime. Takes 5 mg     . busPIRone (BUSPAR) 10 MG tablet Take 0.5 tablets (5 mg total) by mouth 2 (two) times daily as needed (for anxiety). (Patient taking differently: Take 10 mg by mouth 2 (two) times daily. ) 60 tablet 0  . clopidogrel (PLAVIX) 75 MG tablet Take 1 tablet (75 mg total) by mouth daily with breakfast. 90 tablet 3  . gabapentin (NEURONTIN) 300 MG capsule Take 1 capsule (300 mg total) by mouth 3 (three) times daily. 90 capsule 3  . insulin glargine (LANTUS) 100 UNIT/ML injection Inject 40 Units into the skin 2 (two) times daily. s    . Insulin Pen Needle (PEN NEEDLES 5/16") 30G X 8 MM MISC Use with insulin pen daily DX:E11.9 100 each 5  . levothyroxine (SYNTHROID, LEVOTHROID) 125 MCG tablet Take 1 tablet (125 mcg total) by mouth daily before breakfast. 30 tablet 1  . metFORMIN (GLUCOPHAGE) 500 MG tablet Take 500 mg by mouth 2 (two) times daily with a meal.    . morphine (MS CONTIN) 100 MG  12 hr tablet Take 100 mg by mouth every 12 (twelve) hours as needed for pain.     . naloxegol oxalate (MOVANTIK) 25 MG TABS tablet Take 1 tablet (25 mg total) by mouth daily. 30 tablet 3  . RABEprazole (ACIPHEX) 20 MG tablet Take 2 tablets (40 mg total) by mouth daily before breakfast. 30 tablet 1  . tamsulosin (FLOMAX) 0.4 MG CAPS capsule Take 0.4 mg by mouth at bedtime.     . traZODone (DESYREL) 100 MG tablet Take 3 tablets (300 mg total) by mouth at bedtime. 30 tablet 0  . umeclidinium-vilanterol (ANORO ELLIPTA) 62.5-25 MCG/INH AEPB  Inhale 1 puff into the lungs daily. 60 each 5  . venlafaxine XR (EFFEXOR-XR) 150 MG 24 hr capsule Take 1 capsule (150 mg total) by mouth daily with breakfast. (Patient taking differently: 150 mg 2 (two) times daily. ) 30 capsule 1   No current facility-administered medications on file prior to visit.    No Known Allergies  Physical exam:  Vitals:   02/17/18 1046  BP: 137/78  Pulse: (!) 58  Resp: 20  SpO2: 92%  Weight: 251 lb (113.9 kg)  Height: 6' (1.829 m)    Extremities: 1+ dorsalis pedis pulse bilaterally, brawny discoloration circumferentially with woody thickened skin bilaterally, ulceration right leg as depicted below.  2 mm ulceration lateral aspect left leg.      Data: Patient had a venous reflux exam today which shows reflux in the common femoral vein as well as the greater saphenous vein with some reflux in the lesser saphenous vein as well lesser saphenous vein was 4 to 5 mm greater saphenous vein was 4 to 6 mm but 8 mm at the junction and there was reflux at the saphenofemoral junction on the right side.  On the left side there was reflux at the saphenofemoral junction and greater saphenous vein with vein diameter of 4 to 5 mm.  Assessment: Chronic venous stasis ulceration right lower extremity CEAP class VI, left leg class V  Plan: We will plan to schedule the patient in the near future for laser ablation of the right greater and lesser saphenous as well as the left greater saphenous vein.  Believe this will help with wound healing.  It was also emphasized to the patient that he needs to quit smoking to assist in wound healing.  Ruta Hinds, MD [facility] Office: [phone] Pager: [phone]   Assessment: Mixed pathology venous arterial ulcer right leg

## 2018-03-02 ENCOUNTER — Other Ambulatory Visit: Payer: Self-pay | Admitting: *Deleted

## 2018-03-02 DIAGNOSIS — I83813 Varicose veins of bilateral lower extremities with pain: Secondary | ICD-10-CM

## 2018-03-04 DIAGNOSIS — I69320 Aphasia following cerebral infarction: Secondary | ICD-10-CM | POA: Diagnosis not present

## 2018-03-04 DIAGNOSIS — E11622 Type 2 diabetes mellitus with other skin ulcer: Secondary | ICD-10-CM | POA: Diagnosis not present

## 2018-03-04 DIAGNOSIS — L97822 Non-pressure chronic ulcer of other part of left lower leg with fat layer exposed: Secondary | ICD-10-CM | POA: Diagnosis not present

## 2018-03-04 DIAGNOSIS — I69959 Hemiplegia and hemiparesis following unspecified cerebrovascular disease affecting unspecified side: Secondary | ICD-10-CM | POA: Diagnosis not present

## 2018-03-04 DIAGNOSIS — L97812 Non-pressure chronic ulcer of other part of right lower leg with fat layer exposed: Secondary | ICD-10-CM | POA: Diagnosis not present

## 2018-03-04 DIAGNOSIS — I69359 Hemiplegia and hemiparesis following cerebral infarction affecting unspecified side: Secondary | ICD-10-CM | POA: Diagnosis not present

## 2018-03-04 DIAGNOSIS — F17210 Nicotine dependence, cigarettes, uncomplicated: Secondary | ICD-10-CM | POA: Diagnosis not present

## 2018-03-04 DIAGNOSIS — J449 Chronic obstructive pulmonary disease, unspecified: Secondary | ICD-10-CM | POA: Diagnosis not present

## 2018-03-18 ENCOUNTER — Other Ambulatory Visit: Payer: Self-pay

## 2018-03-18 ENCOUNTER — Ambulatory Visit: Payer: Medicare HMO | Admitting: Vascular Surgery

## 2018-03-18 ENCOUNTER — Encounter: Payer: Self-pay | Admitting: Vascular Surgery

## 2018-03-18 ENCOUNTER — Encounter (HOSPITAL_BASED_OUTPATIENT_CLINIC_OR_DEPARTMENT_OTHER): Payer: Medicare HMO | Attending: Internal Medicine

## 2018-03-18 VITALS — BP 126/64 | HR 56 | Temp 97.2°F | Resp 18 | Ht 72.0 in | Wt 250.0 lb

## 2018-03-18 DIAGNOSIS — L97919 Non-pressure chronic ulcer of unspecified part of right lower leg with unspecified severity: Secondary | ICD-10-CM | POA: Diagnosis not present

## 2018-03-18 DIAGNOSIS — F17210 Nicotine dependence, cigarettes, uncomplicated: Secondary | ICD-10-CM | POA: Insufficient documentation

## 2018-03-18 DIAGNOSIS — I83811 Varicose veins of right lower extremities with pain: Secondary | ICD-10-CM

## 2018-03-18 DIAGNOSIS — I69359 Hemiplegia and hemiparesis following cerebral infarction affecting unspecified side: Secondary | ICD-10-CM | POA: Diagnosis not present

## 2018-03-18 DIAGNOSIS — L97812 Non-pressure chronic ulcer of other part of right lower leg with fat layer exposed: Secondary | ICD-10-CM | POA: Diagnosis not present

## 2018-03-18 DIAGNOSIS — E11622 Type 2 diabetes mellitus with other skin ulcer: Secondary | ICD-10-CM | POA: Insufficient documentation

## 2018-03-18 DIAGNOSIS — I69320 Aphasia following cerebral infarction: Secondary | ICD-10-CM | POA: Diagnosis not present

## 2018-03-18 HISTORY — PX: ENDOVENOUS ABLATION SAPHENOUS VEIN W/ LASER: SUR449

## 2018-03-18 NOTE — Progress Notes (Signed)
     Laser Ablation Procedure    Date: 03/18/2018   Brad Taylor DOB:[birth year]  Consent signed: Yes    Surgeon:  Dr. Ruta Hinds  Procedure: Laser Ablation: right Greater Saphenous Vein  BP 126/64 (BP Location: Left Arm, Patient Position: Sitting, Cuff Size: Large)   Pulse (!) 56   Temp (!) 97.2 F (36.2 C) (Oral)   Resp 18   Ht 6' (1.829 m)   Wt 250 lb (113.4 kg)   SpO2 (!) 86%   BMI 33.91 kg/m   Tumescent Anesthesia: 460 cc 0.9% NaCl with 50 cc Lidocaine HCL 1% and 15 cc 8.4% NaHCO3  Local Anesthesia: 6 cc Lidocaine HCL and NaHCO3 (ratio 2:1)  15 watts continuous mode        Total energy: 1987Joules   Total time: 2:12    Patient tolerated procedure well  Ruta Hinds, MD [facility] [place] Office: [phone] Pager: [phone]     Description of Procedure:  After marking the course of the secondary varicosities, the patient was placed on the operating table in the supine position, and the right leg was prepped and draped in sterile fashion.   Local anesthetic was administered and under ultrasound guidance the saphenous vein was accessed with a micro needle and guide wire; then the mirco puncture sheath was placed.  A guide wire was inserted saphenofemoral junction , followed by a 5 french sheath.  The position of the sheath and then the laser fiber below the junction was confirmed using the ultrasound.  Tumescent anesthesia was administered along the course of the saphenous vein using ultrasound guidance. The patient was placed in Trendelenburg position and protective laser glasses were placed on patient and staff, and the laser was fired at 15 watts continuous mode advancing 1-37mm/second for a total of 1987 joules.     Steri strip were applied to the IV insertion site and ABD pads and Ace wrap was applied to right upper calf and thigh.   Ace wrap bandages were applied over the phlebectomy sites and at the top of the saphenofemoral  junction. Blood loss was less than 15 cc.  The patient ambulated out of the operating room having tolerated the procedure well.

## 2018-03-20 ENCOUNTER — Other Ambulatory Visit: Payer: Self-pay | Admitting: *Deleted

## 2018-03-20 ENCOUNTER — Encounter: Payer: Self-pay | Admitting: Vascular Surgery

## 2018-03-20 DIAGNOSIS — I83813 Varicose veins of bilateral lower extremities with pain: Secondary | ICD-10-CM

## 2018-03-30 ENCOUNTER — Ambulatory Visit (INDEPENDENT_AMBULATORY_CARE_PROVIDER_SITE_OTHER): Payer: Medicare HMO | Admitting: Family Medicine

## 2018-03-30 VITALS — BP 122/62 | HR 52 | Temp 98.0°F | Resp 18 | Ht 70.75 in | Wt 262.0 lb

## 2018-03-30 DIAGNOSIS — G4710 Hypersomnia, unspecified: Secondary | ICD-10-CM

## 2018-03-30 DIAGNOSIS — G4730 Sleep apnea, unspecified: Secondary | ICD-10-CM

## 2018-03-30 NOTE — Progress Notes (Signed)
Subjective:    Patient ID: Brad Taylor, male    DOB: May 05, 1951, 67 y.o.   MRN: [id number]  Constipation     07/2017 Patient presents with 1 week of worsening chest congestion, audible wheezing, increasing shortness of breath, and head congestion.  On examination today, the patient has markedly diminished breath sounds left greater than right.  He has diffuse rhonchorous breath sounds but he has right basilar rales heard posteriorly.  He has diffuse expiratory wheezing.  He has diminished breath sounds bilaterally.  He has not been using albuterol.  He denies any hemoptysis.  He denies any chest pain.  He denies any fevers or chills or pleurisy.  At that time, my plan was: Patient appears to have a COPD exacerbation.  Begin prednisone taper pack for the bronchospasms appreciated on exam in addition to albuterol 2 puffs every 6 hours as needed.  Add Levaquin 500 mg p.o. daily for 7 days particularly given the rales appreciated in the right lower lobe.  Recheck next week or sooner if worse.  09/01/17 Patient presents with 2-3 weeks of hot and cold flashes. It sounds like he may be running fevers. He will start sweating for no reason. He will wake up drenched with sweat. They have not checked his temperature. During that same time, he reports worsening constipation. He is on morphine to the care of the [place]. He is increase the amount of morphine that he is taking due to pain in his right leg. On examination, there is a large wound,/venous stasis ulcer on the lateral aspect of his right shin. It is actually a complex of several small wounds of coalesced and one large. The superior wound is 11 cm x 6 cm in an "I" shaped configuration. The smaller wound is a 6 cm circular ulcer down to the underlying muscle/fascia. There is a foul odor emanating from the wound. There is opaque yellowish-green exudate coming from the wound. The surrounding skin is erythematous and warm and painful. It appears to be  secondarily infected. I believe this may be the source of his hot and cold flashes and suspected fevers. Recently saw vascular surgery who improved the blood flow in his right leg.  He underwent atherectomy and balloon angioplasty of the tibioperoneal trunk.  Previously had a heavily calcified 70% stenosis that was improved to 0% after the procedure earlier this month.  Hopefully this will help with this ulcer and healing but I do believe the patient is at high risk for losing his extremity if the wound will not heal.  At that time, my plan was: I believe the patient has a venous stasis ulcer that has become secondarily infected. It is been a nonhealing ulcer now for more than a year and cared for at the [place] and I believe the nonhealing aspect is likely due to arterial insufficiency. Begin Bactrim double strength tablets 1 by mouth twice a day and recheck later this week to see if the cellulitis is improving. Consult wound clinic as soon as possible to help facilitate healing of the wound. Obtain lab work to workup other potential causes of fever of unknown origin. Start movantik 25 mg a day for opiate-induced constipation. I believe his sleeping is likely due to the fact he states he more morphine. He apparently was screen for obstructive sleep apnea at the [place] last year and was found to not have that according to the patient.    09/04/17 Patient is doing much better.  Constipation resolved  on movantik.  His leg feels much better and he is having to take less morphine.  He has an appointment to see the wound clinic of Wednesday of next week.  The erythema in the right lower extremity has faded though still present.  The discharge has decreased dramatically.  The smell is much improved.  The warmth has improved and is not near as hot nor is painful to the touch.  The cellulitis seems to be resolving albeit slowly.  At that time, my plan was: The wound on his right leg is essentially unchanged.  However the  cellulitis seems to be improving.  His fever seem to be improving.  His pain is definitely improved.  The discharge and the swelling is much better.  Complete my Bactrim that I prescribed earlier this week.  I would extend the course for an additional 7 days to complete 14 days total.  He was given 10 days of clindamycin at the [place] yesterday.  I believe it is fine to switch from the Bactrim to the clindamycin as they essentially are covering Staph however the clindamycin may also cover anaerobic bacteria.  Therefore I am perfectly acceptable of the switch.  Follow-up with the wound clinic as discussed.  09/19/17 Patient is here today with his wife complaining of severe fatigue.  He states that he has no energy.  He denies any chest pain or shortness of breath.  He reports very little strength or stamina or desire to do anything.  He also reports hot flashes.  He is checked his temperature and they are not fevers.  His wound on his leg is improving under the care of a wound clinic.  His lab workup previously did show significant hypogonadism with a total testosterone level of 84.  However he is falling asleep during our office visit.  He dozes off as I talk to him and has to startle himself back awake.  I explained to the patient that this would not be due to hypogonadism and most likely suggests sleep apnea versus narcolepsy.  He states that in the remote past he had a sleep study that diagnosed him with sleep apnea however he is receiving no treatment.  He reports feeling sleepy and tired all the time.  He can easily fall asleep during conversations, after lunch, reading a book, watching TV, riding in a car.  He falls asleep constantly.  His Epworth sleepiness score would therefore be over 18!  At that time, my plan was: This is multifactorial and due to a combination of hypogonadism as well as I anticipate obstructive sleep apnea.  I recommended a referral to a sleep specialist for a split level sleep study.  I  believe the patient would significantly benefit from CPAP therapy as far as treating his hypersomnolence and excessive daytime sleepiness.  I am sure this will also help with his fatigue.  However I believe also receiving testosterone replacement would also help with his fatigue and hot flashes.  Therefore I will start him on testosterone cypionate 200 mg IM every 2 weeks.  The wife will pick up the supplies and bring them back to the clinic so that we can teach her how to administer the shots.  Recheck all labs in 3 months.  We discussed the increased risk of cardiovascular events on testosterone as well as increased risk of prostate cancer and the patient is willing to accept these risk  10/23/17 Since I last saw him, he has refused the sleep study.  He states that he is claustrophobic and he refuses to wear the mask and therefore he does not want to be evaluated.  He continues to have daily hot flashes that are insufferable.  He does not even want to leave the house due to the hot and cold flashes he continues to feel.  He is received 2 doses of testosterone over the last 4 weeks but has seen no improvement in the hot flashes over the fatigue.  He is afebrile.  He does have a cough and chest congestion however he has no other symptoms of any infection.  He denies any sore throat.  He denies any otalgia or sinus pain.  He denies any nausea vomiting or diarrhea.  He denies any dysuria.  On exam today, he does have left posterior basilar wheezes and crackles which are asymmetric and abnormal.  Otherwise his exam is unremarkable.  His wife is concerned because we have not checked a vitamin B12 level.  Previous lab work showed significant hypogonadism and a normal TSH.  At that time, my plan was: Hot flashes could possibly be due to testosterone deficiency however I am concerned about underlying malignancy.  Given his cough, his abnormal pulmonary exam, I will proceed with a chest x-ray.  Given his chronic fatigue  I will check a vitamin B12 along with a CBC.  Given his hot flashes which potentially could be fevers and chronic fatigue I will perform a serum protein electrophoresis to evaluate for multiple myeloma.  I believe his fatigue is still likely multifactorial and related to smoking, hypogonadism, and untreated obstructive sleep apnea.  Addendum (10/29/17) Lab work was unremarkable.  Chest x-ray showed a possible right hilar mass.  Patient has a past medical history of partial right lung resection due to cancer performed at the [place] 2 years ago.  He has no pulmonologist there who follows with him.  CT scan was performed which suggest scar tissue in the right lung was the cause of the opacity seen on the chest x-ray.  However there was a polypoid mass in the trachea of uncertain significance.  Possible mucus however cancer cannot be ruled out.  Given the patient's night sweats and fatigue, I have recommended pulmonary consult for possible bronchoscopy to evaluate the lesion seen in his distal trachea I discussed these findings with his wife and she is in agreement.  Consult pulmonary.  11/07/17 Patient went to the hospital since I last saw him.  He continues to endorse sudden rapid hot flashes.  Instance while sitting on the exam table, he rapidly starts taking off his jacket and rocking uncontrollably stating that he needs to get out of this room is too hot.  However he is not warm to the touch.  He is not visibly diaphoretic or erythematous.  Patient has seen no benefit on testosterone however I have not recheck the level since he started the medication almost 2 months ago to ensure adequate dosage.  I have not screen the patient for TB.  I have not check blood cultures although I do not believe his story is consistent with fever of unknown origin.  The wound on his right leg is healing nicely under the care of the wound clinic.  There is no evidence of cellulitis.  At that time, my plan was: I apologized to the  patient and his wife.  I cannot find anything else to do to treat his condition.  I will recheck a testosterone level to ensure that he is  absorbing the shots adequately.  If his testosterone level is normal, I will consult endocrinology to see if they have any other suggestions.  Meanwhile I will try gabapentin 300 mg p.o. 3 times daily off label to treat possible hot flashes related to testosterone deficiency.  I will screen the patient for tuberculosis given his past service in the [employer] in a foreign country for possible signs of reactivation of TB although I feel this is highly unlikely.  I will check blood cultures x2 to evaluate for silent bacteremia.  I will screen a PSA to ensure that there is no evidence of change since the initiation of testosterone.  03/30/18 After the patient's last appointment, I started him on gabapentin for hot flashes and the hot flashes subsided.  He has done remarkably well on the gabapentin.  So much so that he discontinue testosterone.  However he presents today complaining of hypersomnolence.  He is falling asleep easily throughout the day.  In fact during our encounter, the patient actually nodded off twice while I was talking to him!.  He states that he falls asleep easily while riding in a car, watching TV, after eating, sitting quietly, reading etc.  He can fall asleep at the "the drop of a hat".  He has a history of obstructive sleep apnea however he is not being treated at the present time due to claustrophobia and his inability to tolerate the mask.  He has not been evaluated with a formal sleep study in many many years.  He is also taking numerous medications that could possibly cause this as well including MS Contin 100 mg p.o. twice daily, trazodone 300 mg p.o. nightly.  These medicines are prescribed through the [place] for chronic pain and depression and insomnia.  He is also taking the gabapentin that I was giving him for hot flashes.   Past Medical History:    Diagnosis Date  . AAA (abdominal aortic aneurysm) ([place]) 5/15   3.5x3.5 cm  . COPD (chronic obstructive pulmonary disease) ([place])   . Depression   . Diabetes mellitus without complication ([place])   . GERD (gastroesophageal reflux disease)   . Hyperlipidemia   . Hypothyroidism   . Post traumatic stress disorder   . Stroke ([place])   . Thyroid disease   . Ulcer    Past Surgical History:  Procedure Laterality Date  . ABDOMINAL AORTOGRAM W/LOWER EXTREMITY N/A 08/20/2017   Procedure: ABDOMINAL AORTOGRAM W/LOWER EXTREMITY;  Surgeon: Waynetta Sandy, MD;  Location: [facility];  Service: Cardiovascular;  Laterality: N/A;  . CATARACT EXTRACTION    . CHOLECYSTECTOMY    . ENDOVENOUS ABLATION SAPHENOUS VEIN W/ LASER Right 03/18/2018   endovenous laser ablation R [facility] by Ruta Hinds MD   . [place]    . LUNG REMOVAL, PARTIAL     right, [facility] at [facility] for [facility]  . PERIPHERAL VASCULAR ATHERECTOMY Right 08/20/2017   Procedure: PERIPHERAL VASCULAR ATHERECTOMY;  Surgeon: Waynetta Sandy, MD;  Location: [facility];  Service: Cardiovascular;  Laterality: Right;  posterior tibial and tibeoperoneal trunk  . PERIPHERAL VASCULAR BALLOON ANGIOPLASTY Right 08/20/2017   Procedure: PERIPHERAL VASCULAR BALLOON ANGIOPLASTY;  Surgeon: Waynetta Sandy, MD;  Location: [facility];  Service: Cardiovascular;  Laterality: Right;  Anterior tibial  . VIDEO BRONCHOSCOPY Bilateral 12/30/2017   Procedure: VIDEO BRONCHOSCOPY WITHOUT FLUORO;  Surgeon: Marshell Garfinkel, MD;  Location: [facility];  Service: Cardiopulmonary;  Laterality: Bilateral;   Current Outpatient Medications  on File Prior to Visit  Medication Sig Dispense Refill  . albuterol (PROVENTIL HFA;VENTOLIN HFA) 108 (90 Base) MCG/ACT inhaler INHALE 2 PUFFS INTO THE LUNGS EVERY 6 HOURS AS NEEDED FOR WHEEZE OR SHORTNESS OF BREATH 18 g 3  . atorvastatin (LIPITOR) 10 MG tablet Take 5 mg by mouth at bedtime. Takes  5 mg     . busPIRone (BUSPAR) 10 MG tablet Take 0.5 tablets (5 mg total) by mouth 2 (two) times daily as needed (for anxiety). (Patient taking differently: Take 10 mg by mouth 2 (two) times daily. ) 60 tablet 0  . clopidogrel (PLAVIX) 75 MG tablet Take 1 tablet (75 mg total) by mouth daily with breakfast. 90 tablet 3  . gabapentin (NEURONTIN) 300 MG capsule Take 1 capsule (300 mg total) by mouth 3 (three) times daily. 90 capsule 3  . insulin glargine (LANTUS) 100 UNIT/ML injection Inject 40 Units into the skin 2 (two) times daily. s    . Insulin Pen Needle (PEN NEEDLES 5/16") 30G X 8 MM MISC Use with insulin pen daily DX:E11.9 100 each 5  . levothyroxine (SYNTHROID, LEVOTHROID) 125 MCG tablet Take 1 tablet (125 mcg total) by mouth daily before breakfast. 30 tablet 1  . metFORMIN (GLUCOPHAGE) 500 MG tablet Take 500 mg by mouth 2 (two) times daily with a meal.    . morphine (MS CONTIN) 100 MG 12 hr tablet Take 100 mg by mouth every 12 (twelve) hours as needed for pain.     . naloxegol oxalate (MOVANTIK) 25 MG TABS tablet Take 1 tablet (25 mg total) by mouth daily. 30 tablet 3  . RABEprazole (ACIPHEX) 20 MG tablet Take 2 tablets (40 mg total) by mouth daily before breakfast. 30 tablet 1  . tamsulosin (FLOMAX) 0.4 MG CAPS capsule Take 0.4 mg by mouth at bedtime.     . traZODone (DESYREL) 100 MG tablet Take 3 tablets (300 mg total) by mouth at bedtime. 30 tablet 0  . umeclidinium-vilanterol (ANORO ELLIPTA) 62.5-25 MCG/INH AEPB Inhale 1 puff into the lungs daily. 60 each 5  . venlafaxine XR (EFFEXOR-XR) 150 MG 24 hr capsule Take 1 capsule (150 mg total) by mouth daily with breakfast. (Patient taking differently: 150 mg 2 (two) times daily. ) 30 capsule 1   No current facility-administered medications on file prior to visit.    No Known Allergies Social History   Socioeconomic History  . Marital status: Married    Spouse name: Diane  . Number of children: 1  . Years of education: HS  . Highest  education level: Not on file  Occupational History  . Occupation: Retired  Scientific laboratory technician  . Financial resource strain: Not on file  . Food insecurity:    Worry: Not on file    Inability: Not on file  . Transportation needs:    Medical: Not on file    Non-medical: Not on file  Tobacco Use  . Smoking status: Current Every Day Smoker    Packs/day: 1.00    Years: 30.00    Pack years: 30.00    Types: Cigarettes  . Smokeless tobacco: Former Systems developer    Quit date: 05/04/2013  . Tobacco comment: Less than 1/2 pk per day  Substance and Sexual Activity  . Alcohol use: No    Comment: Former heavy drinker, been sober for 10 years  . Drug use: No  . Sexual activity: Not on file  Lifestyle  . Physical activity:    Days per week: Not on file  Minutes per session: Not on file  . Stress: Not on file  Relationships  . Social connections:    Talks on phone: Not on file    Gets together: Not on file    Attends religious service: Not on file    Active member of club or organization: Not on file    Attends meetings of clubs or organizations: Not on file    Relationship status: Not on file  . Intimate partner violence:    Fear of current or ex partner: Not on file    Emotionally abused: Not on file    Physically abused: Not on file    Forced sexual activity: Not on file  Other Topics Concern  . Not on file  Social History Narrative   Patient lives at home with spouse.   Caffeine Use: 3-4 cups daily   Retired Dealer.      Review of Systems  Gastrointestinal: Positive for constipation.  All other systems reviewed and are negative.      Objective:   Physical Exam  Constitutional: He appears well-developed and well-nourished. No distress.  HENT:  Right Ear: External ear normal.  Left Ear: External ear normal.  Nose: Nose normal.  Mouth/Throat: Oropharynx is clear and moist. No oropharyngeal exudate.  Eyes: Conjunctivae are normal.  Neck: Neck supple.  Cardiovascular: Normal  rate, regular rhythm and normal heart sounds.  No murmur heard. Pulmonary/Chest: No accessory muscle usage. No respiratory distress. He has no wheezes. He has no rales.  Skin: Lesion noted. He is not diaphoretic.  Vitals reviewed.         Assessment & Plan:   Sleep apnea, unspecified type - Plan: Ambulatory referral to Sleep Studies  Hypersomnolence - Plan: Ambulatory referral to Sleep Studies  I believe is multifactorial.  I explained to the patient I believe the majority of his symptoms are due to obstructive sleep apnea that is poorly treated.  I have recommended that he have an appointment to see a sleep specialist to discuss sleep apnea and to see if they have any other options for treatment.  Perhaps there is a new mask or device that he can wear that can help him tolerate CPAP/BiPAP.  I also believe that polypharmacy is likely playing a role particular his narcotic pain medication.  Therefore I recommended that he discuss reducing his medication to the [place] and decreasing his MS Contin that he is taking on a daily basis which is likely contributing.

## 2018-04-01 ENCOUNTER — Ambulatory Visit (HOSPITAL_COMMUNITY)
Admission: RE | Admit: 2018-04-01 | Discharge: 2018-04-01 | Disposition: A | Discharge status: Home / Self Care | Payer: Medicare HMO | Source: Ambulatory Visit | Attending: Vascular Surgery | Admitting: Vascular Surgery

## 2018-04-01 ENCOUNTER — Encounter (HOSPITAL_COMMUNITY): Payer: Non-veteran care

## 2018-04-01 ENCOUNTER — Ambulatory Visit: Payer: Non-veteran care | Admitting: Vascular Surgery

## 2018-04-01 ENCOUNTER — Other Ambulatory Visit: Payer: Self-pay

## 2018-04-01 ENCOUNTER — Ambulatory Visit (INDEPENDENT_AMBULATORY_CARE_PROVIDER_SITE_OTHER): Payer: Self-pay | Admitting: Vascular Surgery

## 2018-04-01 ENCOUNTER — Encounter: Payer: Self-pay | Admitting: Vascular Surgery

## 2018-04-01 VITALS — BP 163/75 | HR 52 | Temp 97.3°F | Resp 20 | Ht 72.0 in | Wt 238.0 lb

## 2018-04-01 DIAGNOSIS — E11622 Type 2 diabetes mellitus with other skin ulcer: Secondary | ICD-10-CM | POA: Diagnosis not present

## 2018-04-01 DIAGNOSIS — I69359 Hemiplegia and hemiparesis following cerebral infarction affecting unspecified side: Secondary | ICD-10-CM | POA: Diagnosis not present

## 2018-04-01 DIAGNOSIS — I83811 Varicose veins of right lower extremities with pain: Secondary | ICD-10-CM

## 2018-04-01 DIAGNOSIS — I83813 Varicose veins of bilateral lower extremities with pain: Secondary | ICD-10-CM | POA: Diagnosis not present

## 2018-04-01 DIAGNOSIS — L97812 Non-pressure chronic ulcer of other part of right lower leg with fat layer exposed: Secondary | ICD-10-CM | POA: Diagnosis not present

## 2018-04-01 DIAGNOSIS — F17210 Nicotine dependence, cigarettes, uncomplicated: Secondary | ICD-10-CM | POA: Diagnosis not present

## 2018-04-01 DIAGNOSIS — I69320 Aphasia following cerebral infarction: Secondary | ICD-10-CM | POA: Diagnosis not present

## 2018-04-01 NOTE — Progress Notes (Signed)
Patient is a 67 year old male who returns for follow-up today after recent right greater saphenous laser ablation.  He has combined venous and arterial ulcers on the right leg.  He has previously undergone atherectomy of the right tibioperoneal trunk and posterior tibial artery with angioplasty by Dr. [name] January 2019.  He is still being followed by Dr. Jeri Cos at the wound center at [facility] for a large stasis ulcer on the right leg and a smaller one on the left leg.  He states the ulcer is still quite painful.  He denies any significant change in the swelling in his right leg.  He states the soreness has improved from his procedure.    Physical exam:  Vitals:   04/01/18 1032 04/01/18 1036  BP: (!) 159/73 (!) 163/75  Pulse: (!) 52 (!) 52  Resp: 20   Temp: (!) 97.3 F (36.3 C)   TempSrc: Oral   SpO2: 95%   Weight: 238 lb (108 kg)   Height: 6' (1.829 m)     Some induration right medial thigh no erythema mild tenderness patient has a compression dressing extending from the foot to the knee for his venous stasis ulcer  I also reviewed the patient's ultrasound dated February 17, 2018.  This shows reflux in the right lesser saphenous vein with a 5 mm diameter proximally and 5 mm at the origin  Data: Patient with a duplex ultrasound today which showed thrombosis of the right greater saphenous vein up to 2-1/2 cm from the saphenofemoral junction    Assessment: Successful ablation right greater saphenous vein.  Patient also has reflux in the lesser saphenous vein and I believe ablation of this would improve his overall wound healing potential.  Plan: Laser ablation of the right lesser saphenous vein scheduled for next week.  Procedure details discussed with the patient today.  He understands and agrees to proceed.  Ruta Hinds, MD [facility] Office: [phone] Pager: [phone]

## 2018-04-01 NOTE — Progress Notes (Signed)
Vitals:   04/01/18 1032  BP: (!) 159/73  Pulse: (!) 52  Resp: 20  Temp: (!) 97.3 F (36.3 C)  TempSrc: Oral  SpO2: 95%  Weight: 238 lb (108 kg)  Height: 6' (1.829 m)

## 2018-04-08 ENCOUNTER — Other Ambulatory Visit: Payer: Self-pay

## 2018-04-08 ENCOUNTER — Ambulatory Visit (INDEPENDENT_AMBULATORY_CARE_PROVIDER_SITE_OTHER): Payer: Medicare HMO | Admitting: Vascular Surgery

## 2018-04-08 ENCOUNTER — Encounter: Payer: Self-pay | Admitting: Vascular Surgery

## 2018-04-08 VITALS — BP 133/67 | HR 56 | Temp 97.0°F | Resp 16 | Ht 72.0 in | Wt 250.0 lb

## 2018-04-08 DIAGNOSIS — I83811 Varicose veins of right lower extremities with pain: Secondary | ICD-10-CM

## 2018-04-08 HISTORY — PX: ENDOVENOUS ABLATION SAPHENOUS VEIN W/ LASER: SUR449

## 2018-04-08 NOTE — Progress Notes (Signed)
     Laser Ablation Procedure    Date: 04/08/2018   Brad Taylor DOB:[birth year]  Consent signed: Yes    Surgeon:  Dr. Ruta Hinds  Procedure: Laser Ablation: right Small Saphenous Vein  BP 133/67 (BP Location: Left Arm, Patient Position: Sitting, Cuff Size: Large)   Pulse (!) 56   Temp (!) 97 F (36.1 C) (Oral)   Resp 16   Ht 6' (1.829 m)   Wt 250 lb (113.4 kg)   SpO2 94%   BMI 33.91 kg/m   Tumescent Anesthesia: 275 cc 0.9% NaCl with 50 cc Lidocaine HCL 1% and 15 cc 8.4% NaHCO3  Local Anesthesia: 4 cc Lidocaine HCL and NaHCO3 (ratio 2:1)  15 watts continuous mode        Total energy: 1508 Joules   Total time: 1:40      Patient tolerated procedure well  Notes    Santyl ointment applied to right calf ulcers and Hydrogel applied to sterile dressing pad. Kerlix applied to right calf and CoFlex applied over Kerlix on right calf.    Description of Procedure:  After marking the course of the secondary varicosities, the patient was placed on the operating table in the prone position, and the right leg was prepped and draped in sterile fashion.   Local anesthetic was administered and under ultrasound guidance the saphenous vein was accessed with a micro needle and guide wire; then the mirco puncture sheath was placed.  A guide wire was inserted saphenopopliteal junction , followed by a 5 french sheath.  The position of the sheath and then the laser fiber below the junction was confirmed using the ultrasound.  Tumescent anesthesia was administered along the course of the saphenous vein using ultrasound guidance. The patient was placed in Trendelenburg position and protective laser glasses were placed on patient and staff, and the laser was fired at 15 watts continuous mode advancing 1-10mm/second for a total of 1508 joules.     Steri strip was applied to the IV insertion site and ABD pads were applied over right calf and lower thigh.   Ace wrap bandages were applied at the top of  the saphenopopliteal junction and right calf and thigh.  Blood loss was less than 15 cc.  The patient ambulated out of the operating room having tolerated the procedure well.  Ruta Hinds, MD [facility] Office: [phone] Pager: [phone]

## 2018-04-09 ENCOUNTER — Other Ambulatory Visit: Payer: Self-pay

## 2018-04-09 DIAGNOSIS — I83813 Varicose veins of bilateral lower extremities with pain: Secondary | ICD-10-CM

## 2018-04-09 DIAGNOSIS — I83811 Varicose veins of right lower extremities with pain: Secondary | ICD-10-CM

## 2018-04-15 ENCOUNTER — Encounter (HOSPITAL_BASED_OUTPATIENT_CLINIC_OR_DEPARTMENT_OTHER): Payer: Medicare HMO | Attending: Internal Medicine

## 2018-04-15 ENCOUNTER — Encounter: Payer: Self-pay | Admitting: Vascular Surgery

## 2018-04-15 ENCOUNTER — Other Ambulatory Visit: Payer: Self-pay

## 2018-04-15 ENCOUNTER — Ambulatory Visit (INDEPENDENT_AMBULATORY_CARE_PROVIDER_SITE_OTHER): Payer: Medicare HMO | Admitting: Vascular Surgery

## 2018-04-15 ENCOUNTER — Ambulatory Visit (HOSPITAL_COMMUNITY)
Admission: RE | Admit: 2018-04-15 | Discharge: 2018-04-15 | Disposition: A | Discharge status: Home / Self Care | Payer: Medicare HMO | Source: Ambulatory Visit | Attending: Vascular Surgery | Admitting: Vascular Surgery

## 2018-04-15 VITALS — BP 149/79 | HR 68 | Temp 97.6°F | Resp 18 | Ht 72.0 in | Wt 250.0 lb

## 2018-04-15 DIAGNOSIS — F17210 Nicotine dependence, cigarettes, uncomplicated: Secondary | ICD-10-CM | POA: Diagnosis not present

## 2018-04-15 DIAGNOSIS — I83813 Varicose veins of bilateral lower extremities with pain: Secondary | ICD-10-CM

## 2018-04-15 DIAGNOSIS — J449 Chronic obstructive pulmonary disease, unspecified: Secondary | ICD-10-CM | POA: Insufficient documentation

## 2018-04-15 DIAGNOSIS — E11622 Type 2 diabetes mellitus with other skin ulcer: Secondary | ICD-10-CM | POA: Insufficient documentation

## 2018-04-15 DIAGNOSIS — I69359 Hemiplegia and hemiparesis following cerebral infarction affecting unspecified side: Secondary | ICD-10-CM | POA: Insufficient documentation

## 2018-04-15 DIAGNOSIS — I69320 Aphasia following cerebral infarction: Secondary | ICD-10-CM | POA: Diagnosis not present

## 2018-04-15 DIAGNOSIS — L97812 Non-pressure chronic ulcer of other part of right lower leg with fat layer exposed: Secondary | ICD-10-CM | POA: Diagnosis not present

## 2018-04-15 DIAGNOSIS — I83811 Varicose veins of right lower extremities with pain: Secondary | ICD-10-CM | POA: Insufficient documentation

## 2018-04-15 DIAGNOSIS — I82811 Embolism and thrombosis of superficial veins of right lower extremities: Secondary | ICD-10-CM | POA: Diagnosis not present

## 2018-04-15 NOTE — Progress Notes (Signed)
Patient is a 67 year old male who returns for follow-up today.  He underwent laser ablation of the right lesser saphenous vein on April 08, 2018.  He previously underwent laser ablation of the right greater saphenous vein on March 18, 2018.  This was done for a wound on his right leg.  He has known arterial occlusive disease as well which is followed by Dr. [name].  We were planning laser ablation of his left greater saphenous vein.  However, he does have underlying arterial occlusive disease and currently does not have a wound on his left leg.  Review of systems: He reports no fever or chills.  He has no shortness of breath or chest pain.  Physical exam:  Vitals:   04/15/18 1043 04/15/18 1049  BP: (!) 154/82 (!) 149/79  Pulse: 68 68  Resp: 18   Temp: 97.6 F (36.4 C)   TempSrc: Oral   SpO2: 98%   Weight: 250 lb (113.4 kg)   Height: 6' (1.829 m)     Bilateral lower extremities with some edema compression stockings in place both legs.  No palpable pedal pulses.  Patient has a dressing from the wound center and placed on the right leg and wish to keep this in place.  However he states according to the wound center this has shrunken longitudinally.  Data: Patient had a duplex ultrasound of his right leg today which again showed closure of the right greater saphenous vein and lesser saphenous vein  Assessment: Successful closure of right greater and lesser saphenous vein with a persistent wound but smaller on the right leg.  Plan: The patient has follow-up with Dr. [name] November 1 for reevaluation of his arterial circulation.  For now we will defer laser ablation of his left greater saphenous vein since he currently does not have a wound and may need this vein at some point for lower extremity bypass if his arterial occlusive disease worsens.  However, if he develops a stasis ulcer we will go ahead and probably proceed with left greater saphenous vein ablation at that time.  The patient will  have follow-up with Dr. [name] in November.  He will see me on an as-needed basis.  Ruta Hinds, MD [facility] Office: [phone] Pager: [phone]

## 2018-04-15 NOTE — Progress Notes (Signed)
Vitals:   04/15/18 1043  BP: (!) 154/82  Pulse: 68  Resp: 18  Temp: 97.6 F (36.4 C)  TempSrc: Oral  SpO2: 98%  Weight: 250 lb (113.4 kg)  Height: 6' (1.829 m)

## 2018-04-23 ENCOUNTER — Other Ambulatory Visit: Payer: Self-pay | Admitting: Family Medicine

## 2018-04-29 ENCOUNTER — Other Ambulatory Visit (HOSPITAL_COMMUNITY)
Admission: RE | Admit: 2018-04-29 | Discharge: 2018-04-29 | Disposition: A | Discharge status: Home / Self Care | Payer: Medicare HMO | Source: Other Acute Inpatient Hospital | Attending: Internal Medicine | Admitting: Internal Medicine

## 2018-04-29 DIAGNOSIS — F17210 Nicotine dependence, cigarettes, uncomplicated: Secondary | ICD-10-CM | POA: Diagnosis not present

## 2018-04-29 DIAGNOSIS — J449 Chronic obstructive pulmonary disease, unspecified: Secondary | ICD-10-CM | POA: Diagnosis not present

## 2018-04-29 DIAGNOSIS — L97829 Non-pressure chronic ulcer of other part of left lower leg with unspecified severity: Secondary | ICD-10-CM | POA: Diagnosis not present

## 2018-04-29 DIAGNOSIS — E11622 Type 2 diabetes mellitus with other skin ulcer: Secondary | ICD-10-CM | POA: Diagnosis not present

## 2018-04-29 DIAGNOSIS — I69359 Hemiplegia and hemiparesis following cerebral infarction affecting unspecified side: Secondary | ICD-10-CM | POA: Diagnosis not present

## 2018-04-29 DIAGNOSIS — L97812 Non-pressure chronic ulcer of other part of right lower leg with fat layer exposed: Secondary | ICD-10-CM | POA: Diagnosis not present

## 2018-04-29 DIAGNOSIS — I69320 Aphasia following cerebral infarction: Secondary | ICD-10-CM | POA: Diagnosis not present

## 2018-05-02 LAB — AEROBIC CULTURE  (SUPERFICIAL SPECIMEN)

## 2018-05-02 LAB — AEROBIC CULTURE W GRAM STAIN (SUPERFICIAL SPECIMEN)

## 2018-05-13 IMAGING — CR DG TIBIA/FIBULA 2V*R*
4 series · 4 of 4 positions shown · non-contrast
Comparison: None.

CLINICAL DATA: Lateral distal right lower extremity chronic ulcer.
Clinical concern for osteomyelitis.

EXAM:
RIGHT TIBIA AND FIBULA - 2 VIEW

[x tib-fib ap right (1 of 2)]
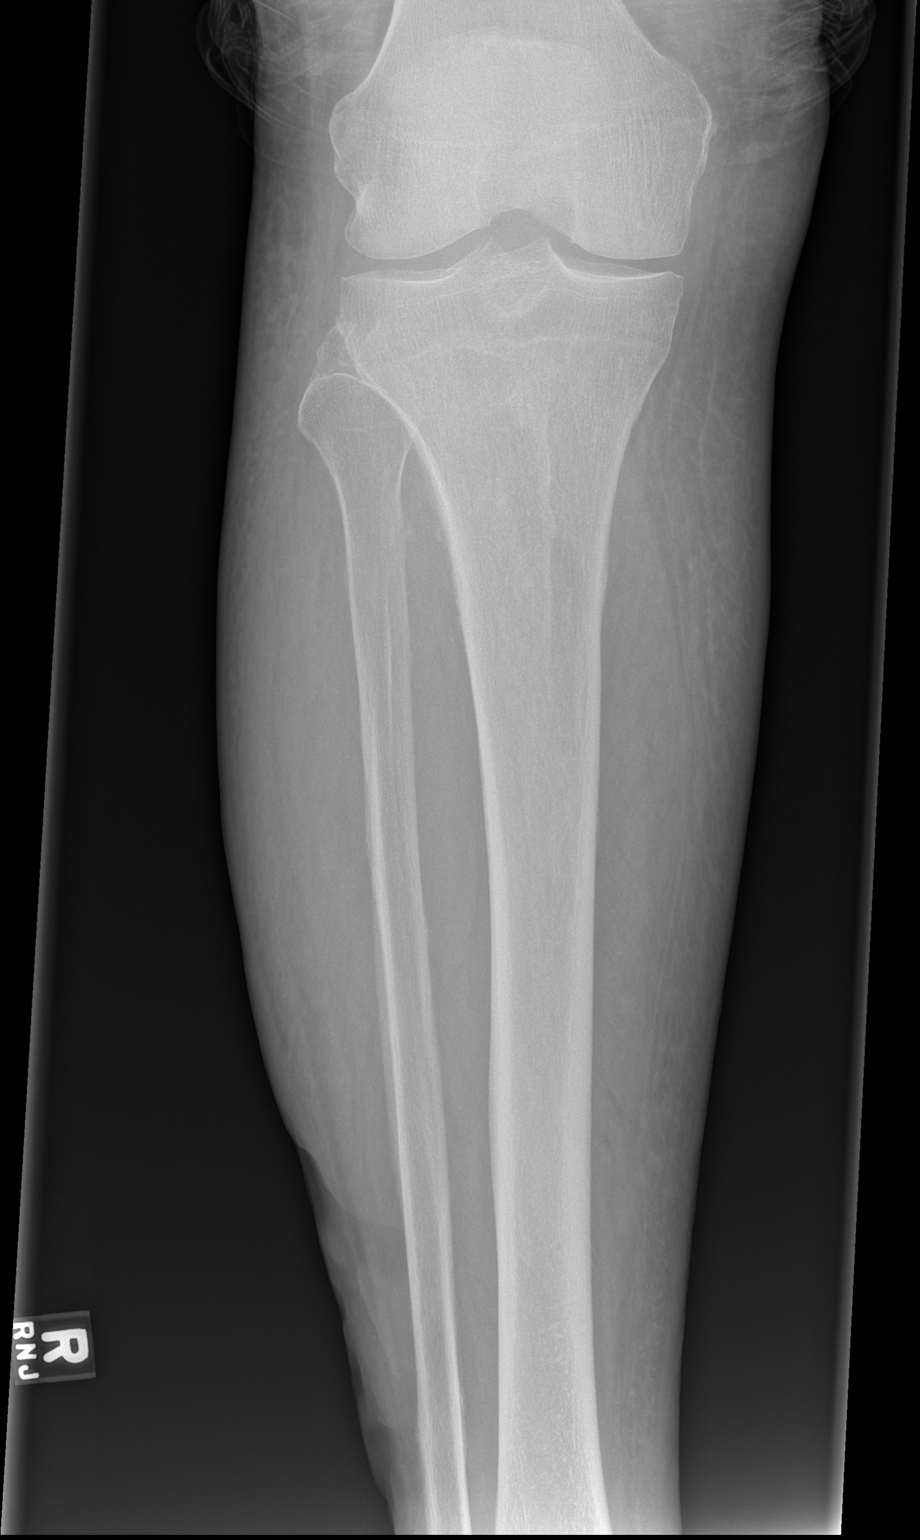

[x tib-fib ap right (2 of 2)]
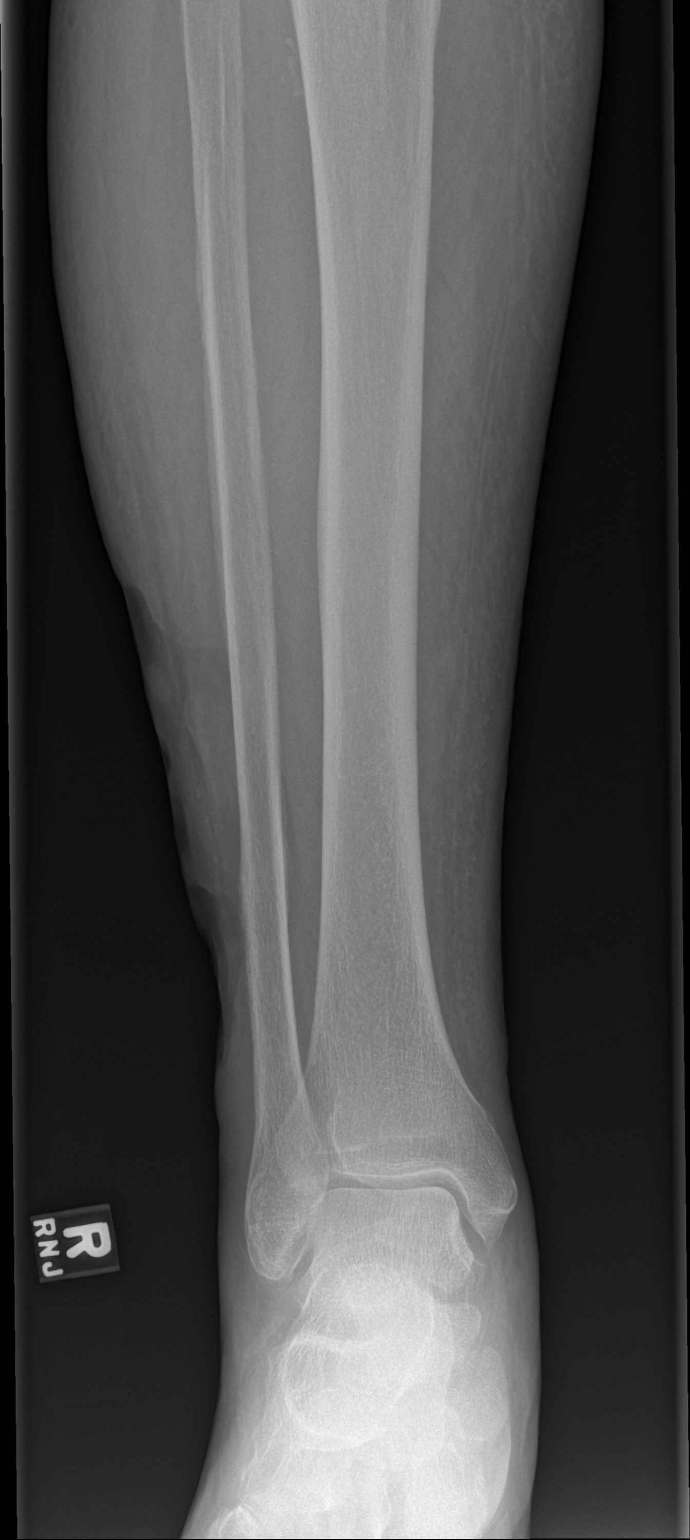

[x tib-fib lat right (1 of 2)]
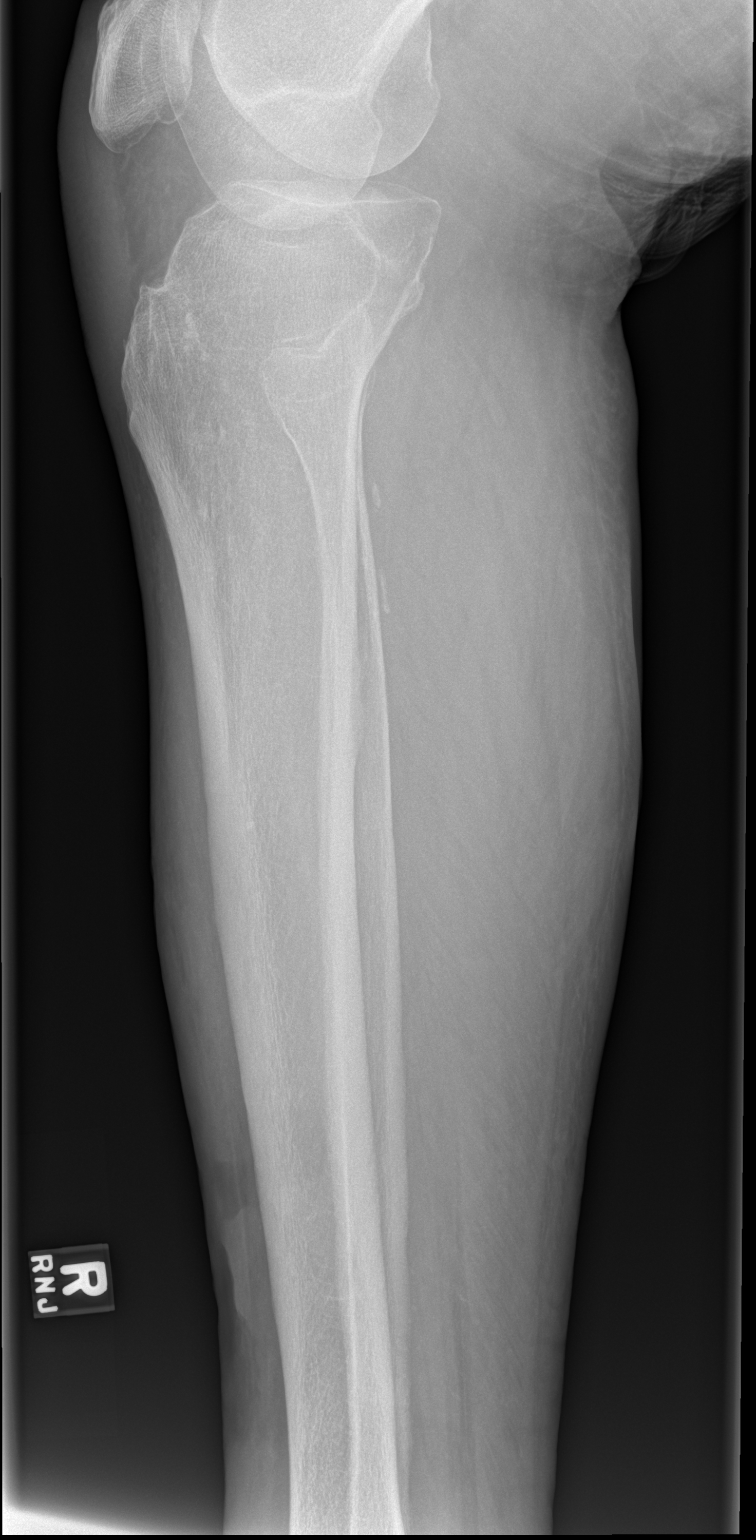

[x tib-fib lat right (2 of 2)]
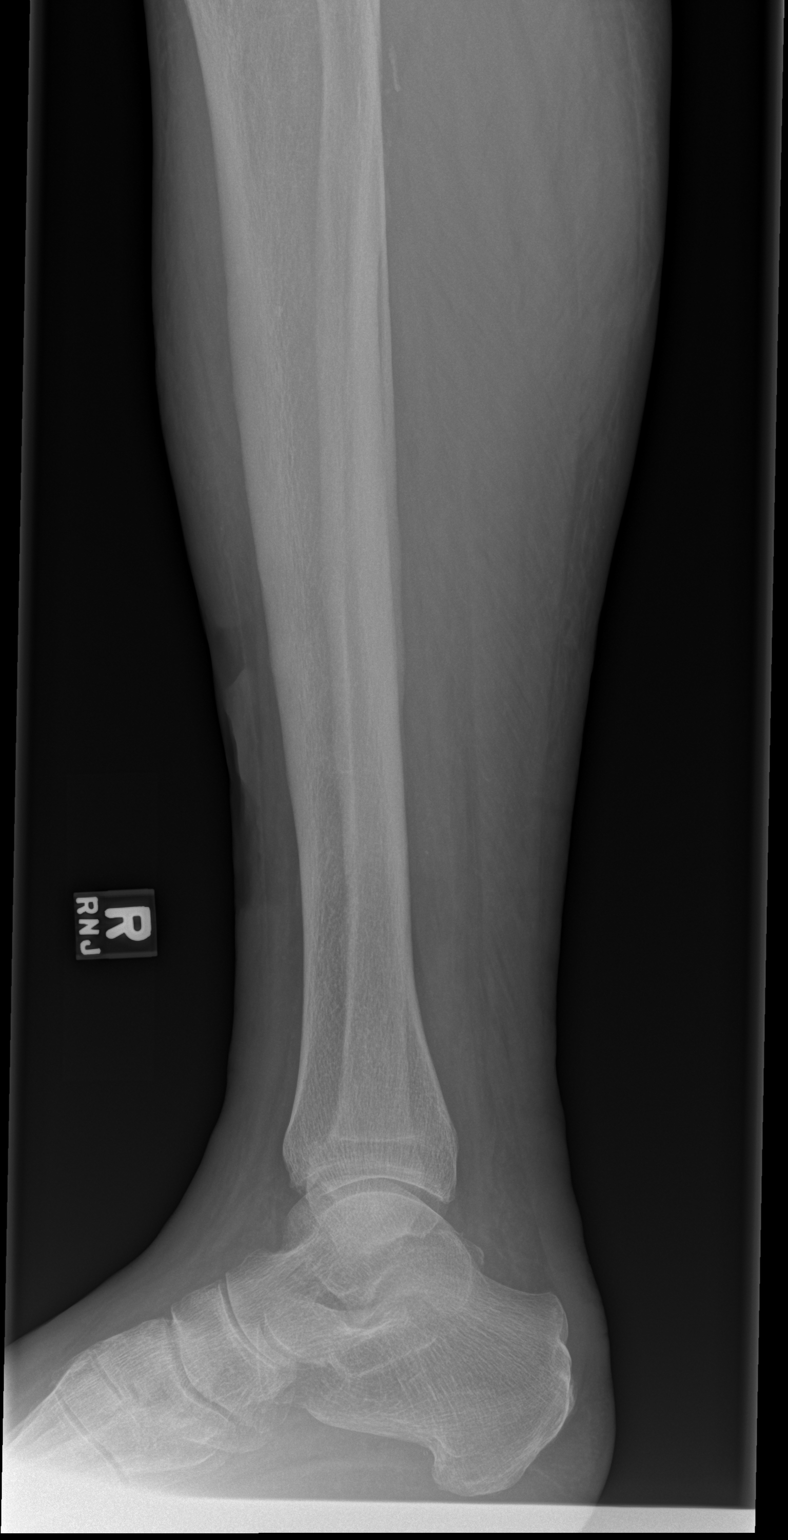

[4 of 4 positions shown; findings below may reference images not displayed]

FINDINGS: Large soft tissue ulcer is noted anterolateral to the distal right
fibular shaft. There are tiny cortical erosions along the lateral
distal right fibular shaft. There is associated irregular mild
periosteal reaction throughout the distal right fibular shaft. Mild
smooth periosteal reaction is present in the mid right femoral shaft
laterally. No suspicious focal osseous lesions. No fractures. No
radiopaque foreign body. No appreciable soft tissue gas.
IMPRESSION: Large soft tissue ulcer anterolateral to the distal right fibular
shaft.

Radiographic evidence of acute/subacute osteomyelitis in the distal
right fibular shaft including tiny cortical erosions and irregular
periosteal reaction.

Smooth periosteal reaction more proximally in the mid right femoral
shaft is nonspecific with osteomyelitis not excluded.

## 2018-05-13 IMAGING — MR MR [PERSON_NAME] LOW W/O CM*R*
4 of 8 series · 19 of 40 positions shown · non-contrast
Comparison: Same day radiographs of the right leg

CLINICAL DATA: Non healing soft tissue wound of the right tibia and
fibula x2 years. Right leg pain and swelling with drainage.

EXAM:
MRI OF LOWER RIGHT EXTREMITY WITHOUT CONTRAST
TECHNIQUE: Multiplanar, multisequence MR imaging of the right leg was
performed. No intravenous contrast was administered.

[Series 3: T1 · axial · 7.0mm · 0.78mm/px · z∈[-247,+149]mm · 6 of 54 slices shown]
[im 1/54]
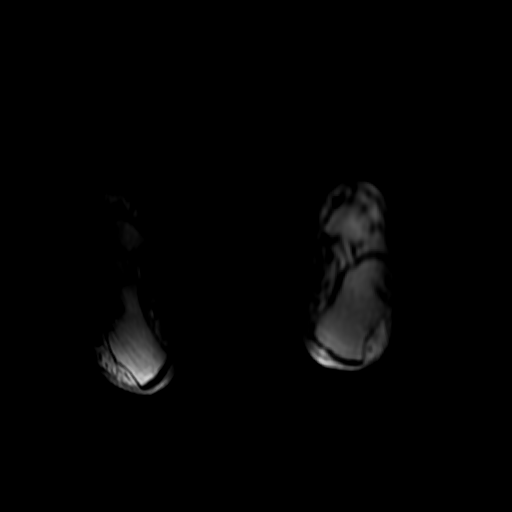
[im 9/54]
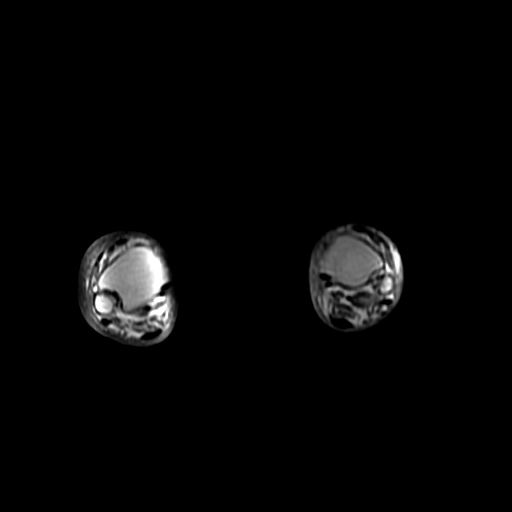
[im 18/54]
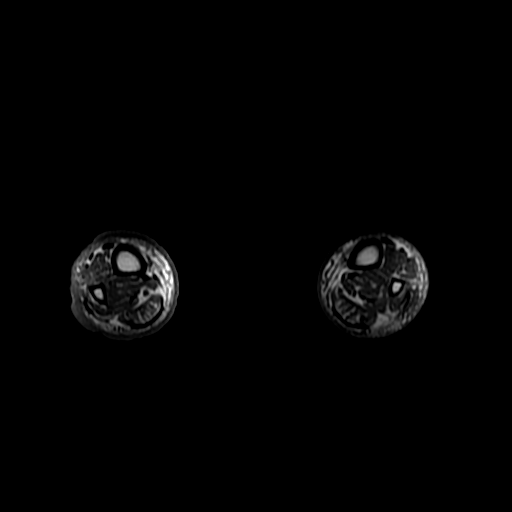
[im 27/54]
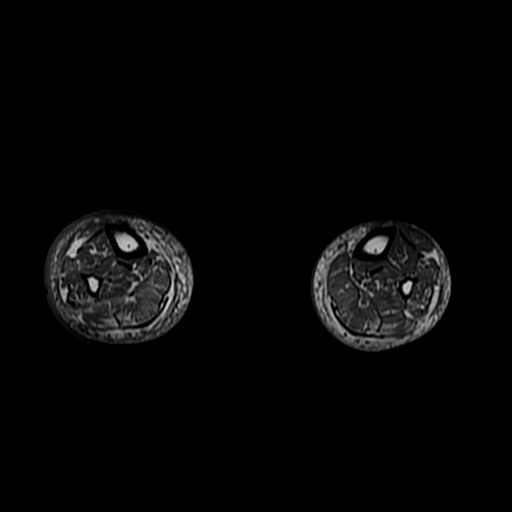
[im 36/54]
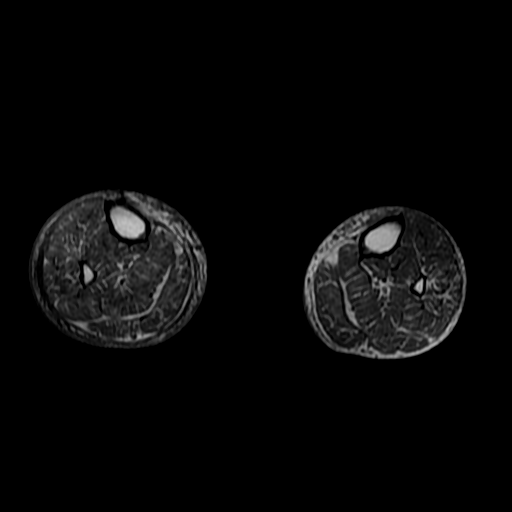
[im 45/54]
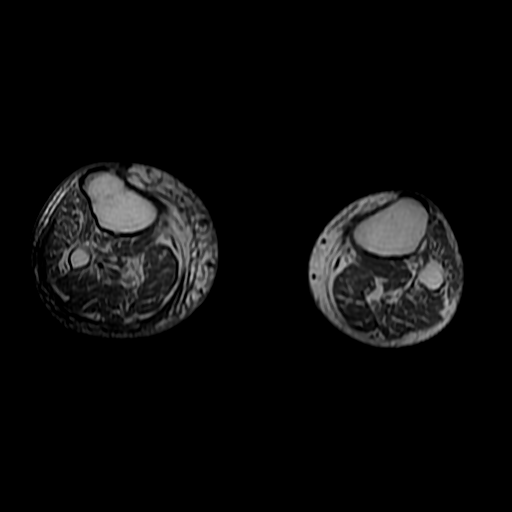

[Series 5: T2 fat-sat · axial · 7.0mm · 0.78mm/px · z∈[-247,+230]mm · 7 of 54 slices shown (1 of 2)]
[im 1/54]
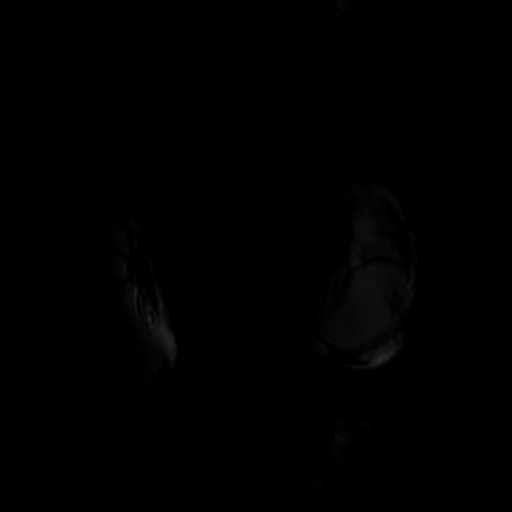
[im 9/54]
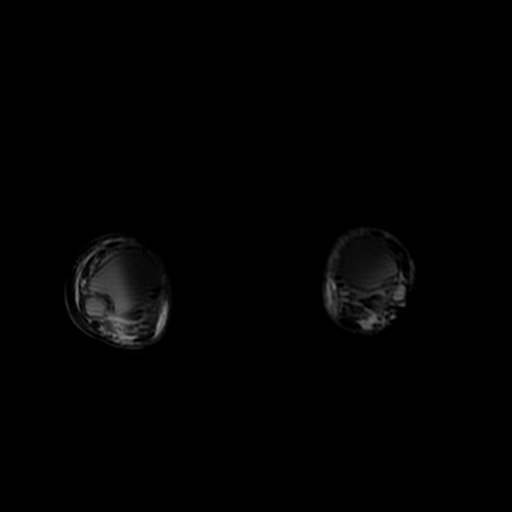
[im 18/54]
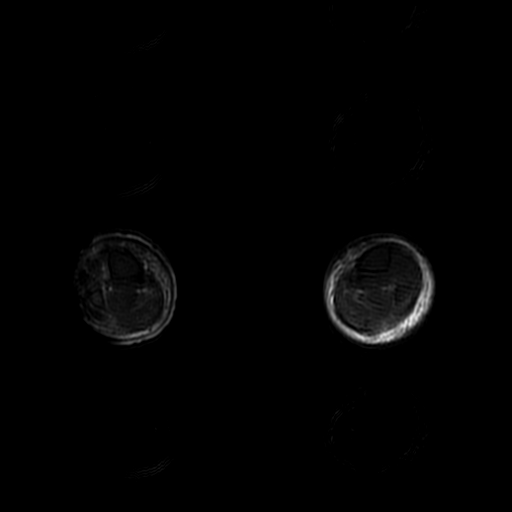
[im 27/54]
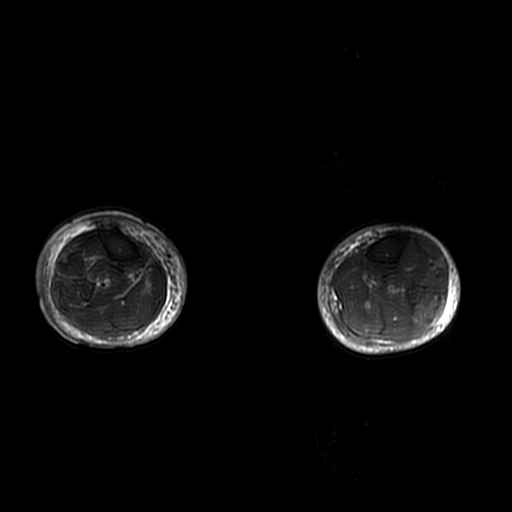
[im 36/54]
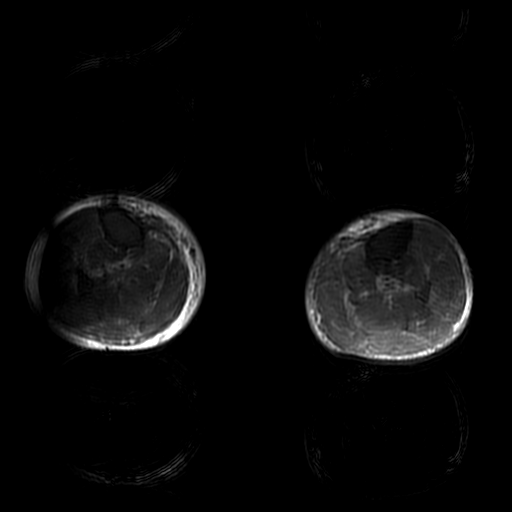
[im 45/54]
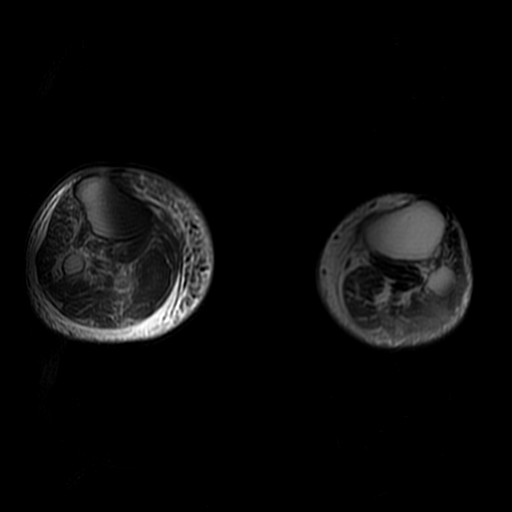
[im 54/54]
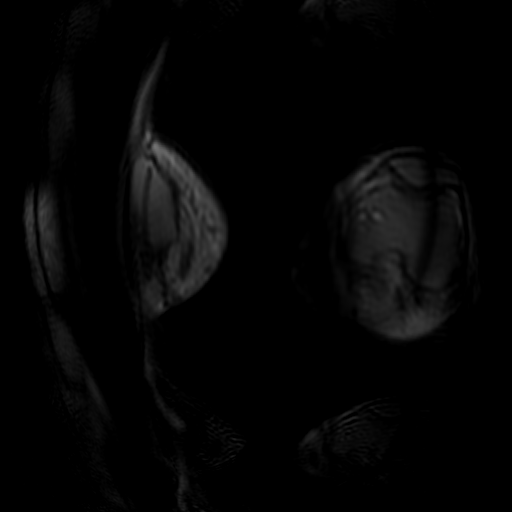

[Series 6: T1 fat-sat · axial · 7.0mm · 0.78mm/px · z∈[-175,+149]mm · 3 of 54 slices shown]
[im 9/54]
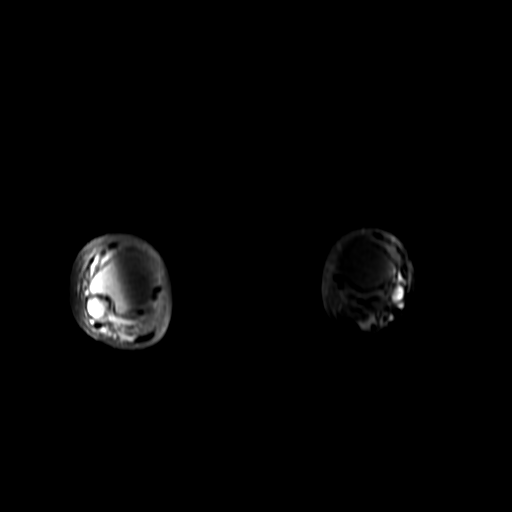
[im 27/54]
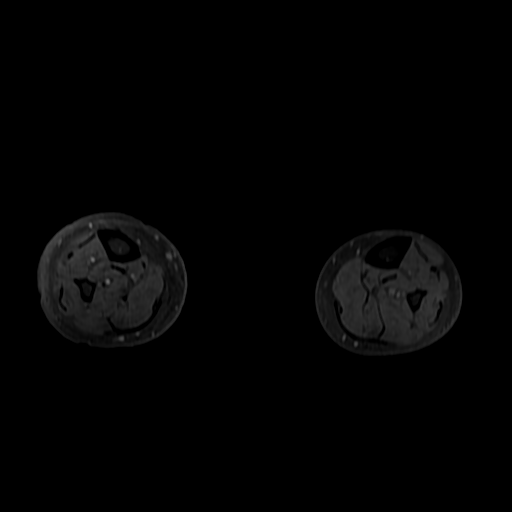
[im 45/54]
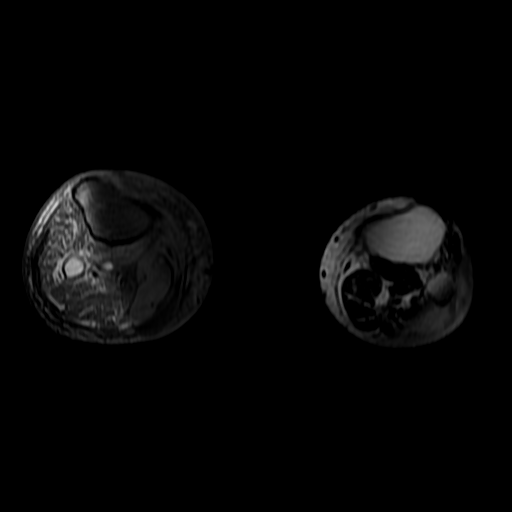

[Series 9: T2 fat-sat · sagittal · 5.0mm · 0.90mm/px · 3 of 25 slices shown (2 of 2)]
[im 1/25]
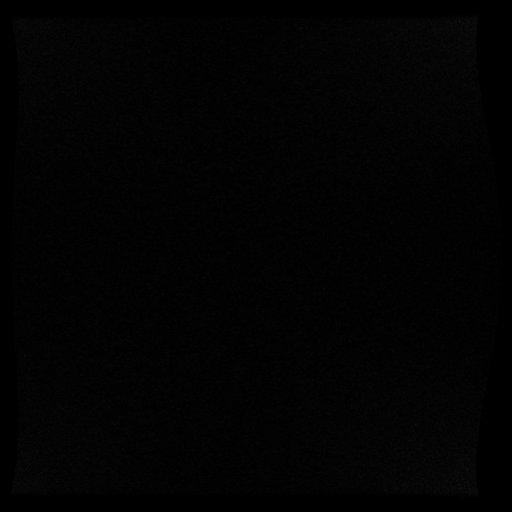
[im 13/25]
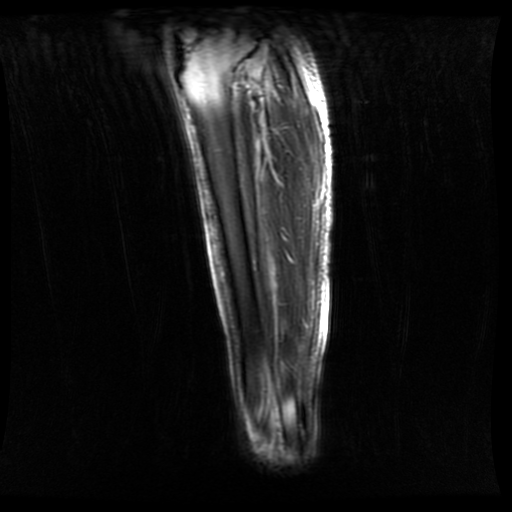
[im 25/25]
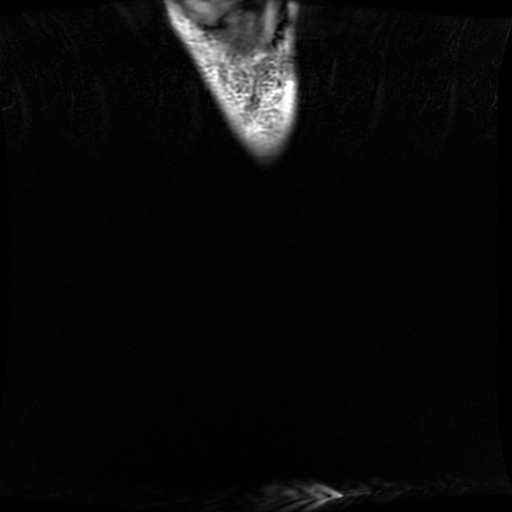

[19 of 40 positions shown; findings below may reference images not displayed]

FINDINGS: The patient refused contrast per technologist's due to pain.

Bones/Joint/Cartilage

No acute fracture or frank bone destruction. Mild reactive
periostitis of the distal fibular diaphysis is suggested with
surrounding soft tissue edema.

Ligaments

Noncontributory

Muscles and Tendons

No intramuscular fluid collection or abscess. Generalized muscular
atrophy of both legs. The tendons crossing the ankle joint are of
normal signal intensity morphology.

Soft tissues

Soft tissue ulcerations along the lateral as well as anterolateral
and posterolateral aspect of the distal right leg.
IMPRESSION: Soft tissue ulcerations along the distal right leg with underlying
soft tissue edema. No drainable fluid collections. Mild periostitis
of the distal fibular diaphysis without conclusive evidence for
osteomyelitis.

## 2018-05-25 ENCOUNTER — Institutional Professional Consult (permissible substitution): Payer: Medicare HMO | Admitting: Neurology

## 2018-05-27 ENCOUNTER — Encounter (HOSPITAL_BASED_OUTPATIENT_CLINIC_OR_DEPARTMENT_OTHER): Payer: Medicare HMO | Attending: Internal Medicine

## 2018-05-27 ENCOUNTER — Encounter (HOSPITAL_BASED_OUTPATIENT_CLINIC_OR_DEPARTMENT_OTHER): Payer: Self-pay

## 2018-05-27 DIAGNOSIS — J449 Chronic obstructive pulmonary disease, unspecified: Secondary | ICD-10-CM | POA: Diagnosis not present

## 2018-05-27 DIAGNOSIS — Z8673 Personal history of transient ischemic attack (TIA), and cerebral infarction without residual deficits: Secondary | ICD-10-CM | POA: Insufficient documentation

## 2018-05-27 DIAGNOSIS — F17210 Nicotine dependence, cigarettes, uncomplicated: Secondary | ICD-10-CM | POA: Insufficient documentation

## 2018-05-27 DIAGNOSIS — E11622 Type 2 diabetes mellitus with other skin ulcer: Secondary | ICD-10-CM | POA: Diagnosis not present

## 2018-05-27 DIAGNOSIS — L97812 Non-pressure chronic ulcer of other part of right lower leg with fat layer exposed: Secondary | ICD-10-CM | POA: Diagnosis not present

## 2018-06-05 ENCOUNTER — Encounter (HOSPITAL_COMMUNITY): Payer: Medicare HMO

## 2018-06-05 ENCOUNTER — Ambulatory Visit: Payer: Medicare HMO | Admitting: Vascular Surgery

## 2018-06-09 ENCOUNTER — Encounter (HOSPITAL_BASED_OUTPATIENT_CLINIC_OR_DEPARTMENT_OTHER): Payer: Medicare HMO | Attending: Internal Medicine

## 2018-06-09 DIAGNOSIS — I69359 Hemiplegia and hemiparesis following cerebral infarction affecting unspecified side: Secondary | ICD-10-CM | POA: Diagnosis not present

## 2018-06-09 DIAGNOSIS — L97919 Non-pressure chronic ulcer of unspecified part of right lower leg with unspecified severity: Secondary | ICD-10-CM | POA: Diagnosis not present

## 2018-06-09 DIAGNOSIS — F17210 Nicotine dependence, cigarettes, uncomplicated: Secondary | ICD-10-CM | POA: Insufficient documentation

## 2018-06-09 DIAGNOSIS — I872 Venous insufficiency (chronic) (peripheral): Secondary | ICD-10-CM | POA: Insufficient documentation

## 2018-06-09 DIAGNOSIS — I69320 Aphasia following cerebral infarction: Secondary | ICD-10-CM | POA: Diagnosis not present

## 2018-06-09 DIAGNOSIS — L97812 Non-pressure chronic ulcer of other part of right lower leg with fat layer exposed: Secondary | ICD-10-CM | POA: Diagnosis not present

## 2018-06-09 DIAGNOSIS — E11622 Type 2 diabetes mellitus with other skin ulcer: Secondary | ICD-10-CM | POA: Insufficient documentation

## 2018-07-01 DIAGNOSIS — I872 Venous insufficiency (chronic) (peripheral): Secondary | ICD-10-CM | POA: Diagnosis not present

## 2018-07-01 DIAGNOSIS — F17210 Nicotine dependence, cigarettes, uncomplicated: Secondary | ICD-10-CM | POA: Diagnosis not present

## 2018-07-01 DIAGNOSIS — I69320 Aphasia following cerebral infarction: Secondary | ICD-10-CM | POA: Diagnosis not present

## 2018-07-01 DIAGNOSIS — L97812 Non-pressure chronic ulcer of other part of right lower leg with fat layer exposed: Secondary | ICD-10-CM | POA: Diagnosis not present

## 2018-07-01 DIAGNOSIS — E11622 Type 2 diabetes mellitus with other skin ulcer: Secondary | ICD-10-CM | POA: Diagnosis not present

## 2018-07-01 DIAGNOSIS — I69359 Hemiplegia and hemiparesis following cerebral infarction affecting unspecified side: Secondary | ICD-10-CM | POA: Diagnosis not present

## 2018-07-08 ENCOUNTER — Institutional Professional Consult (permissible substitution): Payer: Medicare HMO | Admitting: Neurology

## 2018-07-08 ENCOUNTER — Telehealth: Payer: Self-pay | Admitting: Family Medicine

## 2018-07-08 NOTE — Telephone Encounter (Signed)
Pt's wife called and states that he gets most of his meds from the [place]. We have him on Anoro and they do not supply that however they will supply him with Stiolto if it is ok to switch him??? Will need to fax a rx to [place].    Fax # - [phone] Attention [place]

## 2018-07-09 MED ORDER — TIOTROPIUM BROMIDE-OLODATEROL 2.5-2.5 MCG/ACT IN AERS: 2.0000 | INHALATION_SPRAY | Freq: Every day | RESPIRATORY_TRACT | 3 refills | Status: AC

## 2018-07-09 NOTE — Telephone Encounter (Signed)
Stiolto and Anoro are equivalent.  He can discontinue Anoro and replaced with Stiolto 2 inhalations once a day

## 2018-07-09 NOTE — Telephone Encounter (Signed)
RX faxed to [place] / [facility]

## 2018-07-22 ENCOUNTER — Other Ambulatory Visit (HOSPITAL_COMMUNITY)
Admission: RE | Admit: 2018-07-22 | Discharge: 2018-07-22 | Disposition: A | Discharge status: Home / Self Care | Payer: Medicare HMO | Source: Other Acute Inpatient Hospital | Attending: Physician Assistant | Admitting: Physician Assistant

## 2018-07-22 ENCOUNTER — Encounter (HOSPITAL_BASED_OUTPATIENT_CLINIC_OR_DEPARTMENT_OTHER): Payer: Medicare HMO | Attending: Physician Assistant

## 2018-07-22 DIAGNOSIS — F17210 Nicotine dependence, cigarettes, uncomplicated: Secondary | ICD-10-CM | POA: Insufficient documentation

## 2018-07-22 DIAGNOSIS — L97812 Non-pressure chronic ulcer of other part of right lower leg with fat layer exposed: Secondary | ICD-10-CM | POA: Insufficient documentation

## 2018-07-22 DIAGNOSIS — E11622 Type 2 diabetes mellitus with other skin ulcer: Secondary | ICD-10-CM | POA: Diagnosis not present

## 2018-07-22 DIAGNOSIS — I69320 Aphasia following cerebral infarction: Secondary | ICD-10-CM | POA: Insufficient documentation

## 2018-07-22 DIAGNOSIS — I69359 Hemiplegia and hemiparesis following cerebral infarction affecting unspecified side: Secondary | ICD-10-CM | POA: Diagnosis not present

## 2018-07-22 DIAGNOSIS — J449 Chronic obstructive pulmonary disease, unspecified: Secondary | ICD-10-CM | POA: Diagnosis not present

## 2018-07-24 ENCOUNTER — Encounter (HOSPITAL_COMMUNITY): Payer: Medicare HMO

## 2018-07-24 ENCOUNTER — Ambulatory Visit: Payer: Medicare HMO | Admitting: Vascular Surgery

## 2018-07-26 LAB — AEROBIC CULTURE  (SUPERFICIAL SPECIMEN)

## 2018-07-26 LAB — AEROBIC CULTURE W GRAM STAIN (SUPERFICIAL SPECIMEN)

## 2018-08-10 DIAGNOSIS — R062 Wheezing: Secondary | ICD-10-CM | POA: Diagnosis not present

## 2018-08-10 DIAGNOSIS — R0602 Shortness of breath: Secondary | ICD-10-CM | POA: Diagnosis not present

## 2018-08-10 DIAGNOSIS — R05 Cough: Secondary | ICD-10-CM | POA: Diagnosis not present

## 2018-08-10 DIAGNOSIS — R918 Other nonspecific abnormal finding of lung field: Secondary | ICD-10-CM | POA: Diagnosis not present

## 2018-08-10 DIAGNOSIS — J810 Acute pulmonary edema: Secondary | ICD-10-CM | POA: Diagnosis not present

## 2018-08-12 ENCOUNTER — Encounter (HOSPITAL_BASED_OUTPATIENT_CLINIC_OR_DEPARTMENT_OTHER): Payer: Medicare HMO | Attending: Physician Assistant

## 2018-08-12 DIAGNOSIS — I69320 Aphasia following cerebral infarction: Secondary | ICD-10-CM | POA: Insufficient documentation

## 2018-08-12 DIAGNOSIS — F17210 Nicotine dependence, cigarettes, uncomplicated: Secondary | ICD-10-CM | POA: Insufficient documentation

## 2018-08-12 DIAGNOSIS — I69359 Hemiplegia and hemiparesis following cerebral infarction affecting unspecified side: Secondary | ICD-10-CM | POA: Insufficient documentation

## 2018-08-12 DIAGNOSIS — E11622 Type 2 diabetes mellitus with other skin ulcer: Secondary | ICD-10-CM | POA: Diagnosis not present

## 2018-08-12 DIAGNOSIS — L97812 Non-pressure chronic ulcer of other part of right lower leg with fat layer exposed: Secondary | ICD-10-CM | POA: Insufficient documentation

## 2018-08-13 ENCOUNTER — Encounter: Payer: Self-pay | Admitting: Family Medicine

## 2018-08-13 ENCOUNTER — Ambulatory Visit (INDEPENDENT_AMBULATORY_CARE_PROVIDER_SITE_OTHER): Payer: Medicare HMO | Admitting: Family Medicine

## 2018-08-13 VITALS — BP 110/68 | HR 50 | Temp 98.0°F | Resp 18 | Ht 70.75 in | Wt 244.0 lb

## 2018-08-13 DIAGNOSIS — J441 Chronic obstructive pulmonary disease with (acute) exacerbation: Secondary | ICD-10-CM

## 2018-08-13 MED ORDER — PREDNISONE 20 MG PO TABS: 40.0000 mg | ORAL_TABLET | Freq: Every day | ORAL | 0 refills | Status: DC

## 2018-08-13 MED ORDER — INSULIN REGULAR HUMAN 100 UNIT/ML IJ SOLN: 2.0000 [IU] | Freq: Three times a day (TID) | INTRAMUSCULAR | 0 refills | Status: DC

## 2018-08-13 MED ORDER — LEVOFLOXACIN 500 MG PO TABS: 500.0000 mg | ORAL_TABLET | Freq: Every day | ORAL | 0 refills | Status: DC

## 2018-08-13 NOTE — Progress Notes (Signed)
Subjective:    Patient ID: Brad Taylor, male    DOB: 15-Dec-1950, 68 y.o.   MRN: [id number]   Patient's wife was recently admitted to the hospital with a COPD exacerbation.  Patient is here today with similar symptoms.  He became sick approximately 1 week ago.  He reports worsening chest congestion.  He reports increasing cough.  He reports increasing shortness of breath.  On physical exam today he has bilateral wheezing with diminished breath sounds in all 4 lung fields.  He has rhonchorous breath sounds throughout and possible early left basilar rales.  He reports change in sputum quantity and quality.  It is now thick green and heavy.  He is having to use his albuterol more Past Medical History:  Diagnosis Date  . AAA (abdominal aortic aneurysm) ([place]) 5/15   3.5x3.5 cm  . COPD (chronic obstructive pulmonary disease) ([place])   . Depression   . Diabetes mellitus without complication ([place])   . GERD (gastroesophageal reflux disease)   . Hyperlipidemia   . Hypothyroidism   . Post traumatic stress disorder   . Stroke ([place])   . Thyroid disease   . Ulcer    Past Surgical History:  Procedure Laterality Date  . ABDOMINAL AORTOGRAM W/LOWER EXTREMITY N/A 08/20/2017   Procedure: ABDOMINAL AORTOGRAM W/LOWER EXTREMITY;  Surgeon: Waynetta Sandy, MD;  Location: [facility];  Service: Cardiovascular;  Laterality: N/A;  . CATARACT EXTRACTION    . CHOLECYSTECTOMY    . ENDOVENOUS ABLATION SAPHENOUS VEIN W/ LASER Right 03/18/2018   endovenous laser ablation R [facility] by Ruta Hinds MD   . ENDOVENOUS ABLATION SAPHENOUS VEIN W/ LASER Right 04/08/2018   endovenous laser ablation R [facility] by Ruta Hinds MD   . [place]    . LUNG REMOVAL, PARTIAL     right, 2 years ago at [facility] for [facility]  . PERIPHERAL VASCULAR ATHERECTOMY Right 08/20/2017   Procedure: PERIPHERAL VASCULAR ATHERECTOMY;  Surgeon: Waynetta Sandy, MD;  Location: [facility];  Service: Cardiovascular;   Laterality: Right;  posterior tibial and tibeoperoneal trunk  . PERIPHERAL VASCULAR BALLOON ANGIOPLASTY Right 08/20/2017   Procedure: PERIPHERAL VASCULAR BALLOON ANGIOPLASTY;  Surgeon: Waynetta Sandy, MD;  Location: [facility];  Service: Cardiovascular;  Laterality: Right;  Anterior tibial  . VIDEO BRONCHOSCOPY Bilateral 12/30/2017   Procedure: VIDEO BRONCHOSCOPY WITHOUT FLUORO;  Surgeon: Marshell Garfinkel, MD;  Location: [facility];  Service: Cardiopulmonary;  Laterality: Bilateral;   Current Outpatient Medications on File Prior to Visit  Medication Sig Dispense Refill  . albuterol (PROVENTIL HFA;VENTOLIN HFA) 108 (90 Base) MCG/ACT inhaler INHALE 2 PUFFS INTO THE LUNGS EVERY 6 HOURS AS NEEDED FOR WHEEZE OR SHORTNESS OF BREATH 18 g 3  . atorvastatin (LIPITOR) 10 MG tablet Take 5 mg by mouth at bedtime. Takes 5 mg     . busPIRone (BUSPAR) 10 MG tablet Take 0.5 tablets (5 mg total) by mouth 2 (two) times daily as needed (for anxiety). (Patient taking differently: Take 10 mg by mouth 2 (two) times daily. ) 60 tablet 0  . clopidogrel (PLAVIX) 75 MG tablet Take 1 tablet (75 mg total) by mouth daily with breakfast. 90 tablet 3  . gabapentin (NEURONTIN) 300 MG capsule Take 1 capsule (300 mg total) by mouth 3 (three) times daily. 90 capsule 3  . insulin glargine (LANTUS) 100 UNIT/ML injection Inject 40 Units into the skin 2 (two) times daily. s    . Insulin Pen Needle (PEN NEEDLES  5/16") 30G X 8 MM MISC Use with insulin pen daily DX:E11.9 100 each 5  . levothyroxine (SYNTHROID, LEVOTHROID) 125 MCG tablet Take 1 tablet (125 mcg total) by mouth daily before breakfast. 30 tablet 1  . metFORMIN (GLUCOPHAGE) 500 MG tablet Take 500 mg by mouth 2 (two) times daily with a meal.    . morphine (MS CONTIN) 100 MG 12 hr tablet Take 100 mg by mouth every 12 (twelve) hours as needed for pain.     Marland Kitchen MOVANTIK 25 MG TABS tablet TAKE 1 TABLET BY MOUTH EVERY DAY 30 tablet 5  . RABEprazole (ACIPHEX) 20 MG  tablet Take 2 tablets (40 mg total) by mouth daily before breakfast. 30 tablet 1  . tamsulosin (FLOMAX) 0.4 MG CAPS capsule Take 0.4 mg by mouth at bedtime.     . Tiotropium Bromide-Olodaterol (STIOLTO RESPIMAT) 2.5-2.5 MCG/ACT AERS Inhale 2 Inhalers into the lungs daily. 12 g 3  . traZODone (DESYREL) 100 MG tablet Take 3 tablets (300 mg total) by mouth at bedtime. 30 tablet 0  . umeclidinium-vilanterol (ANORO ELLIPTA) 62.5-25 MCG/INH AEPB Inhale 1 puff into the lungs daily. 60 each 5  . venlafaxine XR (EFFEXOR-XR) 150 MG 24 hr capsule Take 1 capsule (150 mg total) by mouth daily with breakfast. (Patient taking differently: 150 mg 2 (two) times daily. ) 30 capsule 1   No current facility-administered medications on file prior to visit.    No Known Allergies Social History   Socioeconomic History  . Marital status: Married    Spouse name: Diane  . Number of children: 1  . Years of education: HS  . Highest education level: Not on file  Occupational History  . Occupation: Retired  Scientific laboratory technician  . Financial resource strain: Not on file  . Food insecurity:    Worry: Not on file    Inability: Not on file  . Transportation needs:    Medical: Not on file    Non-medical: Not on file  Tobacco Use  . Smoking status: Current Every Day Smoker    Packs/day: 1.00    Years: 30.00    Pack years: 30.00    Types: Cigarettes  . Smokeless tobacco: Former Systems developer    Quit date: 05/04/2013  . Tobacco comment: Less than 1/2 pk per day  Substance and Sexual Activity  . Alcohol use: No    Comment: Former heavy drinker, been sober for 10 years  . Drug use: No  . Sexual activity: Not on file  Lifestyle  . Physical activity:    Days per week: Not on file    Minutes per session: Not on file  . Stress: Not on file  Relationships  . Social connections:    Talks on phone: Not on file    Gets together: Not on file    Attends religious service: Not on file    Active member of club or organization: Not on  file    Attends meetings of clubs or organizations: Not on file    Relationship status: Not on file  . Intimate partner violence:    Fear of current or ex partner: Not on file    Emotionally abused: Not on file    Physically abused: Not on file    Forced sexual activity: Not on file  Other Topics Concern  . Not on file  Social History Narrative   Patient lives at home with spouse.   Caffeine Use: 3-4 cups daily   Retired Dealer.  Review of Systems  Gastrointestinal: Positive for constipation.  All other systems reviewed and are negative.      Objective:   Physical Exam  Constitutional: He appears well-developed and well-nourished. No distress.  HENT:  Right Ear: External ear normal.  Left Ear: External ear normal.  Nose: Nose normal.  Mouth/Throat: Oropharynx is clear and moist. No oropharyngeal exudate.  Eyes: Conjunctivae are normal.  Neck: Neck supple.  Cardiovascular: Normal rate, regular rhythm and normal heart sounds.  No murmur heard. Pulmonary/Chest: Effort normal. No accessory muscle usage. He has decreased breath sounds. He has wheezes in the right upper field, the right middle field, the right lower field, the left upper field, the left middle field and the left lower field. He has rhonchi in the right middle field, the right lower field, the left middle field and the left lower field. He has rales in the left lower field.  Skin: Lesion noted. He is not diaphoretic.  Vitals reviewed.         Assessment & Plan:   COPD exacerbation.  Begin prednisone 40 mg a day for 7 days.  Add Levaquin 500 mg daily for 7 days.  Use albuterol 2 puffs inhaled every 4-6 hours as needed.  Gave patient regular insulin with a sliding scale on how to correct his sugars in case they run high once he is on prednisone.  He will give himself 2 units for every 50 points above 150 all the way up to greater than 400 at which point he would give himself 12 units and notify us  immediately.

## 2018-08-14 ENCOUNTER — Other Ambulatory Visit: Payer: Self-pay | Admitting: Family Medicine

## 2018-08-14 MED ORDER — INSULIN NPH (HUMAN) (ISOPHANE) 100 UNIT/ML ~~LOC~~ SUSP: 2.0000 [IU] | Freq: Three times a day (TID) | SUBCUTANEOUS | 11 refills | Status: DC

## 2018-08-27 ENCOUNTER — Ambulatory Visit (INDEPENDENT_AMBULATORY_CARE_PROVIDER_SITE_OTHER): Payer: Medicare HMO | Admitting: Family Medicine

## 2018-08-27 ENCOUNTER — Encounter: Payer: Self-pay | Admitting: Family Medicine

## 2018-08-27 VITALS — BP 126/80 | HR 80 | Temp 97.6°F | Resp 18 | Ht 70.75 in | Wt 243.0 lb

## 2018-08-27 DIAGNOSIS — R569 Unspecified convulsions: Secondary | ICD-10-CM

## 2018-08-27 LAB — CBC WITH DIFFERENTIAL/PLATELET
Absolute Monocytes: 1530 cells/uL — ABNORMAL HIGH (ref 200–950)
BASOS PCT: 0.4 %
Basophils Absolute: 57 cells/uL (ref 0–200)
EOS ABS: 415 {cells}/uL (ref 15–500)
Eosinophils Relative: 2.9 %
HCT: 48.9 % (ref 38.5–50.0)
Hemoglobin: 16.5 g/dL (ref 13.2–17.1)
Lymphs Abs: 2746 cells/uL (ref 850–3900)
MCH: 29.5 pg (ref 27.0–33.0)
MCHC: 33.7 g/dL (ref 32.0–36.0)
MCV: 87.5 fL (ref 80.0–100.0)
MPV: 11.1 fL (ref 7.5–12.5)
Monocytes Relative: 10.7 %
Neutro Abs: 9552 cells/uL — ABNORMAL HIGH (ref 1500–7800)
Neutrophils Relative %: 66.8 %
Platelets: 216 10*3/uL (ref 140–400)
RBC: 5.59 10*6/uL (ref 4.20–5.80)
RDW: 13.7 % (ref 11.0–15.0)
Total Lymphocyte: 19.2 %
WBC: 14.3 10*3/uL — ABNORMAL HIGH (ref 3.8–10.8)

## 2018-08-27 LAB — COMPLETE METABOLIC PANEL WITH GFR
AG Ratio: 1.4 (calc) (ref 1.0–2.5)
ALT: 35 U/L (ref 9–46)
AST: 25 U/L (ref 10–35)
Albumin: 3.8 g/dL (ref 3.6–5.1)
Alkaline phosphatase (APISO): 139 U/L — ABNORMAL HIGH (ref 40–115)
BUN: 11 mg/dL (ref 7–25)
CO2: 27 mmol/L (ref 20–32)
Calcium: 9.4 mg/dL (ref 8.6–10.3)
Chloride: 91 mmol/L — ABNORMAL LOW (ref 98–110)
Creat: 0.88 mg/dL (ref 0.70–1.25)
GFR, Est African American: 102 mL/min/{1.73_m2} (ref >=60)
GFR, Est Non African American: 88 mL/min/{1.73_m2} (ref >=60)
Globulin: 2.8 g/dL (calc) (ref 1.9–3.7)
Glucose, Bld: 188 mg/dL — ABNORMAL HIGH (ref 65–99)
Potassium: 5 mmol/L (ref 3.5–5.3)
Sodium: 133 mmol/L — ABNORMAL LOW (ref 135–146)
Total Bilirubin: 1 mg/dL (ref 0.2–1.2)
Total Protein: 6.6 g/dL (ref 6.1–8.1)

## 2018-08-27 NOTE — Progress Notes (Signed)
Subjective:    Patient ID: Brad Taylor, male    DOB: 31-Oct-1950, 68 y.o.   MRN: [id number]  08/13/18 Patient's wife was recently admitted to the hospital with a COPD exacerbation.  Patient is here today with similar symptoms.  He became sick approximately 1 week ago.  He reports worsening chest congestion.  He reports increasing cough.  He reports increasing shortness of breath.  On physical exam today he has bilateral wheezing with diminished breath sounds in all 4 lung fields.  He has rhonchorous breath sounds throughout and possible early left basilar rales.  He reports change in sputum quantity and quality.  It is now thick green and heavy.  He is having to use his albuterol more.  At that time, my plan was: COPD exacerbation.  Begin prednisone 40 mg a day for 7 days.  Add Levaquin 500 mg daily for 7 days.  Use albuterol 2 puffs inhaled every 4-6 hours as needed.  Gave patient regular insulin with a sliding scale on how to correct his sugars in case they run high once he is on prednisone.  He will give himself 2 units for every 50 points above 150 all the way up to greater than 400 at which point he would give himself 12 units and notify us immediately.  08/27/18 From a respiratory standpoint, the patient is doing much better.  However 2 days ago, the patient was standing at the sink in his bathroom.  He states that suddenly his legs became weak and he fell.  He denies any syncope.  He denies any head trauma.  He denies any chest pain or shortness of breath.  However ever since that time, the patient has had episodes where his left arm will suddenly begin to shake.  I witnessed 1 of these episodes during our encounter.  There is no loss of consciousness.  The patient was sitting and talking.  Suddenly his entire left arm began to shake.  It begins with a myoclonic jerk.  It then progresses into a generalized shaking of just the left upper extremity.  It seems to be distal to the bicep.  It does not  involve the shoulder.  Primarily includes flexion and extension of the elbow as well as shaking at the wrist.  The patient states this is been occurring several times over the last 24 hours.  If he is holding a drink, he will suddenly spell it.  He has normal grip strength.  Cranial nerves II through XII are grossly intact.  Muscle strength is 5/5 equal and symmetric in the upper and lower extremities.  The patient has residual deficit from a remote stroke that affects his speech.  He also has some mild weakness in his right extremities due to this previous stroke however there is no new deficits seen on exam.  He denies any headache or loss of consciousness.  He denies any nausea or vomiting or blurry vision or double vision Past Medical History:  Diagnosis Date  . AAA (abdominal aortic aneurysm) ([place]) 5/15   3.5x3.5 cm  . COPD (chronic obstructive pulmonary disease) ([place])   . Depression   . Diabetes mellitus without complication ([place])   . GERD (gastroesophageal reflux disease)   . Hyperlipidemia   . Hypothyroidism   . Post traumatic stress disorder   . Stroke ([place])   . Thyroid disease   . Ulcer    Past Surgical History:  Procedure Laterality Date  . ABDOMINAL AORTOGRAM W/LOWER EXTREMITY N/A  08/20/2017   Procedure: ABDOMINAL AORTOGRAM W/LOWER EXTREMITY;  Surgeon: Waynetta Sandy, MD;  Location: [facility];  Service: Cardiovascular;  Laterality: N/A;  . CATARACT EXTRACTION    . CHOLECYSTECTOMY    . ENDOVENOUS ABLATION SAPHENOUS VEIN W/ LASER Right 03/18/2018   endovenous laser ablation R [facility] by Ruta Hinds MD   . ENDOVENOUS ABLATION SAPHENOUS VEIN W/ LASER Right 04/08/2018   endovenous laser ablation R [facility] by Ruta Hinds MD   . [place]    . LUNG REMOVAL, PARTIAL     right, 2 years ago at [facility] for cancer  . PERIPHERAL VASCULAR ATHERECTOMY Right 08/20/2017   Procedure: PERIPHERAL VASCULAR ATHERECTOMY;  Surgeon: Waynetta Sandy, MD;  Location: [facility]  [facility];  Service: Cardiovascular;  Laterality: Right;  posterior tibial and tibeoperoneal trunk  . PERIPHERAL VASCULAR BALLOON ANGIOPLASTY Right 08/20/2017   Procedure: PERIPHERAL VASCULAR BALLOON ANGIOPLASTY;  Surgeon: Waynetta Sandy, MD;  Location: [facility];  Service: Cardiovascular;  Laterality: Right;  Anterior tibial  . VIDEO BRONCHOSCOPY Bilateral 12/30/2017   Procedure: VIDEO BRONCHOSCOPY WITHOUT FLUORO;  Surgeon: Marshell Garfinkel, MD;  Location: [facility];  Service: Cardiopulmonary;  Laterality: Bilateral;   Current Outpatient Medications on File Prior to Visit  Medication Sig Dispense Refill  . albuterol (PROVENTIL HFA;VENTOLIN HFA) 108 (90 Base) MCG/ACT inhaler INHALE 2 PUFFS INTO THE LUNGS EVERY 6 HOURS AS NEEDED FOR WHEEZE OR SHORTNESS OF BREATH 18 g 3  . atorvastatin (LIPITOR) 10 MG tablet Take 5 mg by mouth at bedtime. Takes 5 mg     . busPIRone (BUSPAR) 10 MG tablet Take 0.5 tablets (5 mg total) by mouth 2 (two) times daily as needed (for anxiety). (Patient taking differently: Take 10 mg by mouth 2 (two) times daily. ) 60 tablet 0  . clopidogrel (PLAVIX) 75 MG tablet Take 1 tablet (75 mg total) by mouth daily with breakfast. 90 tablet 3  . gabapentin (NEURONTIN) 300 MG capsule Take 1 capsule (300 mg total) by mouth 3 (three) times daily. 90 capsule 3  . insulin glargine (LANTUS) 100 UNIT/ML injection Inject 40 Units into the skin 2 (two) times daily. s    . insulin NPH Human (NOVOLIN N) 100 UNIT/ML injection Inject 0.02 mLs (2 Units total) into the skin 3 (three) times daily with meals. 10 mL 11  . Insulin Pen Needle (PEN NEEDLES 5/16") 30G X 8 MM MISC Use with insulin pen daily DX:E11.9 100 each 5  . insulin regular (HUMULIN R) 100 units/mL injection Inject 0.02 mLs (2 Units total) into the skin 3 (three) times daily before meals. 10 mL 0  . levofloxacin (LEVAQUIN) 500 MG tablet Take 1 tablet (500 mg total) by mouth daily. 7 tablet 0  . levothyroxine (SYNTHROID,  LEVOTHROID) 125 MCG tablet Take 1 tablet (125 mcg total) by mouth daily before breakfast. 30 tablet 1  . metFORMIN (GLUCOPHAGE) 500 MG tablet Take 500 mg by mouth 2 (two) times daily with a meal.    . morphine (MS CONTIN) 100 MG 12 hr tablet Take 100 mg by mouth every 12 (twelve) hours as needed for pain.     Marland Kitchen MOVANTIK 25 MG TABS tablet TAKE 1 TABLET BY MOUTH EVERY DAY 30 tablet 5  . predniSONE (DELTASONE) 20 MG tablet Take 2 tablets (40 mg total) by mouth daily with breakfast. 14 tablet 0  . RABEprazole (ACIPHEX) 20 MG tablet Take 2 tablets (40 mg total) by mouth daily before breakfast. 30 tablet 1  .  tamsulosin (FLOMAX) 0.4 MG CAPS capsule Take 0.4 mg by mouth at bedtime.     . Tiotropium Bromide-Olodaterol (STIOLTO RESPIMAT) 2.5-2.5 MCG/ACT AERS Inhale 2 Inhalers into the lungs daily. 12 g 3  . traZODone (DESYREL) 100 MG tablet Take 3 tablets (300 mg total) by mouth at bedtime. 30 tablet 0  . umeclidinium-vilanterol (ANORO ELLIPTA) 62.5-25 MCG/INH AEPB Inhale 1 puff into the lungs daily. 60 each 5  . venlafaxine XR (EFFEXOR-XR) 150 MG 24 hr capsule Take 1 capsule (150 mg total) by mouth daily with breakfast. (Patient taking differently: 150 mg 2 (two) times daily. ) 30 capsule 1   No current facility-administered medications on file prior to visit.    No Known Allergies Social History   Socioeconomic History  . Marital status: Married    Spouse name: Diane  . Number of children: 1  . Years of education: HS  . Highest education level: Not on file  Occupational History  . Occupation: Retired  Scientific laboratory technician  . Financial resource strain: Not on file  . Food insecurity:    Worry: Not on file    Inability: Not on file  . Transportation needs:    Medical: Not on file    Non-medical: Not on file  Tobacco Use  . Smoking status: Current Every Day Smoker    Packs/day: 1.00    Years: 30.00    Pack years: 30.00    Types: Cigarettes  . Smokeless tobacco: Former Systems developer    Quit date:  05/04/2013  . Tobacco comment: Less than 1/2 pk per day  Substance and Sexual Activity  . Alcohol use: No    Comment: Former heavy drinker, been sober for 10 years  . Drug use: No  . Sexual activity: Not on file  Lifestyle  . Physical activity:    Days per week: Not on file    Minutes per session: Not on file  . Stress: Not on file  Relationships  . Social connections:    Talks on phone: Not on file    Gets together: Not on file    Attends religious service: Not on file    Active member of club or organization: Not on file    Attends meetings of clubs or organizations: Not on file    Relationship status: Not on file  . Intimate partner violence:    Fear of current or ex partner: Not on file    Emotionally abused: Not on file    Physically abused: Not on file    Forced sexual activity: Not on file  Other Topics Concern  . Not on file  Social History Narrative   Patient lives at home with spouse.   Caffeine Use: 3-4 cups daily   Retired Dealer.      Review of Systems  All other systems reviewed and are negative.      Objective:   Physical Exam  Constitutional: He is oriented to person, place, and time. He appears well-developed and well-nourished. No distress.  HENT:  Right Ear: External ear normal.  Left Ear: External ear normal.  Nose: Nose normal.  Mouth/Throat: Oropharynx is clear and moist. No oropharyngeal exudate.  Eyes: Pupils are equal, round, and reactive to light. Conjunctivae and EOM are normal.  Neck: Neck supple.  Cardiovascular: Normal rate, regular rhythm and normal heart sounds.  No murmur heard. Pulmonary/Chest: Effort normal and breath sounds normal. No accessory muscle usage. He has no decreased breath sounds. He has no wheezes. He has no  rhonchi. He has no rales.  Abdominal: Soft. Bowel sounds are normal. He exhibits no distension. There is no abdominal tenderness. There is no rebound.  Neurological: He is alert and oriented to person, place,  and time. He has normal reflexes. He displays normal reflexes. No cranial nerve deficit. He exhibits normal muscle tone. He displays seizure activity. Coordination normal.  Skin: He is not diaphoretic.  Vitals reviewed.         Assessment & Plan:  Partial seizure ([place]) - Plan: CBC with Differential/Platelet, COMPLETE METABOLIC PANEL WITH GFR, MR Brain W Wo Contrast, Ambulatory referral to Neurology  Patient's presentation is concerning for partial seizures limited to the left upper extremity.  Therefore I recommended MRI of the brain to evaluate for new focal abnormality in the right hemisphere that would explain left-sided partial seizures such as stroke, intracranial lesion.  However his neurologic exam today is normal aside from his chronic speech deficit from his previous stroke.  The other possibility would be myoclonic jerks related to his longstanding narcotic use however this is a new onset phenomenon and the patient has been on this medication for quite some time.  There have otherwise been no changes in his medication.  I will check a CMP to evaluate for any electrolyte abnormalities.  I will check a CBC.  I will also consult neurology for an EEG and evaluation.  If they agree, I think the patient may benefit from starting [place] to prevent these from happening.  At the present time there limited to flexion and extension of his left elbow and jerking of his left hand.  It extinguishes in less than 15 to 30 seconds.  Therefore I will hold off on medication at the present time until we have completed the work-up.  If symptoms begin to worsen, may empirically start him on Keppra.

## 2018-09-04 ENCOUNTER — Ambulatory Visit (HOSPITAL_COMMUNITY): Payer: Medicare HMO

## 2018-09-05 ENCOUNTER — Ambulatory Visit (HOSPITAL_COMMUNITY)
Admission: RE | Admit: 2018-09-05 | Discharge: 2018-09-05 | Disposition: A | Discharge status: Home / Self Care | Payer: Medicare HMO | Source: Ambulatory Visit | Attending: Family Medicine | Admitting: Family Medicine

## 2018-09-05 DIAGNOSIS — R569 Unspecified convulsions: Secondary | ICD-10-CM | POA: Diagnosis not present

## 2018-09-05 DIAGNOSIS — G9389 Other specified disorders of brain: Secondary | ICD-10-CM | POA: Diagnosis not present

## 2018-09-05 MED ORDER — GADOBENATE DIMEGLUMINE 529 MG/ML IV SOLN
10.0000 mL | Freq: Once | INTRAVENOUS | Status: AC | PRN
  Administered 2018-09-05: 10 mL via INTRAVENOUS

## 2018-09-09 ENCOUNTER — Encounter (HOSPITAL_BASED_OUTPATIENT_CLINIC_OR_DEPARTMENT_OTHER): Payer: Medicare HMO | Attending: Internal Medicine

## 2018-09-09 DIAGNOSIS — I69320 Aphasia following cerebral infarction: Secondary | ICD-10-CM | POA: Insufficient documentation

## 2018-09-09 DIAGNOSIS — L97412 Non-pressure chronic ulcer of right heel and midfoot with fat layer exposed: Secondary | ICD-10-CM | POA: Insufficient documentation

## 2018-09-09 DIAGNOSIS — I69359 Hemiplegia and hemiparesis following cerebral infarction affecting unspecified side: Secondary | ICD-10-CM | POA: Insufficient documentation

## 2018-09-09 DIAGNOSIS — J449 Chronic obstructive pulmonary disease, unspecified: Secondary | ICD-10-CM | POA: Insufficient documentation

## 2018-09-09 DIAGNOSIS — F17210 Nicotine dependence, cigarettes, uncomplicated: Secondary | ICD-10-CM | POA: Insufficient documentation

## 2018-09-09 DIAGNOSIS — L97812 Non-pressure chronic ulcer of other part of right lower leg with fat layer exposed: Secondary | ICD-10-CM | POA: Insufficient documentation

## 2018-09-09 DIAGNOSIS — E11622 Type 2 diabetes mellitus with other skin ulcer: Secondary | ICD-10-CM | POA: Insufficient documentation

## 2018-09-10 DIAGNOSIS — J441 Chronic obstructive pulmonary disease with (acute) exacerbation: Secondary | ICD-10-CM | POA: Diagnosis not present

## 2018-09-12 ENCOUNTER — Other Ambulatory Visit: Payer: Self-pay

## 2018-09-12 ENCOUNTER — Encounter (HOSPITAL_COMMUNITY): Payer: Self-pay | Admitting: Internal Medicine

## 2018-09-12 ENCOUNTER — Emergency Department (HOSPITAL_COMMUNITY): Payer: Medicare HMO

## 2018-09-12 ENCOUNTER — Inpatient Hospital Stay (HOSPITAL_COMMUNITY)
Admission: EM | Admit: 2018-09-12 | Discharge: 2018-09-14 | DRG: 191 | Disposition: A | Discharge status: Home Health | Payer: Medicare HMO | Attending: Internal Medicine | Admitting: Internal Medicine

## 2018-09-12 DIAGNOSIS — I10 Essential (primary) hypertension: Secondary | ICD-10-CM | POA: Diagnosis not present

## 2018-09-12 DIAGNOSIS — G253 Myoclonus: Secondary | ICD-10-CM | POA: Diagnosis present

## 2018-09-12 DIAGNOSIS — G8929 Other chronic pain: Secondary | ICD-10-CM | POA: Diagnosis present

## 2018-09-12 DIAGNOSIS — I69351 Hemiplegia and hemiparesis following cerebral infarction affecting right dominant side: Secondary | ICD-10-CM | POA: Diagnosis not present

## 2018-09-12 DIAGNOSIS — R296 Repeated falls: Secondary | ICD-10-CM | POA: Diagnosis present

## 2018-09-12 DIAGNOSIS — R0902 Hypoxemia: Secondary | ICD-10-CM | POA: Diagnosis present

## 2018-09-12 DIAGNOSIS — R410 Disorientation, unspecified: Secondary | ICD-10-CM | POA: Diagnosis not present

## 2018-09-12 DIAGNOSIS — S81801A Unspecified open wound, right lower leg, initial encounter: Secondary | ICD-10-CM | POA: Diagnosis present

## 2018-09-12 DIAGNOSIS — E1165 Type 2 diabetes mellitus with hyperglycemia: Secondary | ICD-10-CM | POA: Diagnosis not present

## 2018-09-12 DIAGNOSIS — E1140 Type 2 diabetes mellitus with diabetic neuropathy, unspecified: Secondary | ICD-10-CM | POA: Diagnosis present

## 2018-09-12 DIAGNOSIS — I69320 Aphasia following cerebral infarction: Secondary | ICD-10-CM

## 2018-09-12 DIAGNOSIS — F17210 Nicotine dependence, cigarettes, uncomplicated: Secondary | ICD-10-CM | POA: Diagnosis not present

## 2018-09-12 DIAGNOSIS — I878 Other specified disorders of veins: Secondary | ICD-10-CM | POA: Diagnosis present

## 2018-09-12 DIAGNOSIS — I712 Thoracic aortic aneurysm, without rupture: Secondary | ICD-10-CM | POA: Diagnosis not present

## 2018-09-12 DIAGNOSIS — I272 Pulmonary hypertension, unspecified: Secondary | ICD-10-CM | POA: Diagnosis present

## 2018-09-12 DIAGNOSIS — J209 Acute bronchitis, unspecified: Secondary | ICD-10-CM | POA: Diagnosis present

## 2018-09-12 DIAGNOSIS — J441 Chronic obstructive pulmonary disease with (acute) exacerbation: Principal | ICD-10-CM | POA: Diagnosis present

## 2018-09-12 DIAGNOSIS — E039 Hypothyroidism, unspecified: Secondary | ICD-10-CM | POA: Diagnosis present

## 2018-09-12 DIAGNOSIS — Z823 Family history of stroke: Secondary | ICD-10-CM

## 2018-09-12 DIAGNOSIS — Z794 Long term (current) use of insulin: Secondary | ICD-10-CM

## 2018-09-12 DIAGNOSIS — I69959 Hemiplegia and hemiparesis following unspecified cerebrovascular disease affecting unspecified side: Secondary | ICD-10-CM | POA: Diagnosis not present

## 2018-09-12 DIAGNOSIS — Z85118 Personal history of other malignant neoplasm of bronchus and lung: Secondary | ICD-10-CM

## 2018-09-12 DIAGNOSIS — F329 Major depressive disorder, single episode, unspecified: Secondary | ICD-10-CM | POA: Diagnosis present

## 2018-09-12 DIAGNOSIS — R4182 Altered mental status, unspecified: Secondary | ICD-10-CM | POA: Diagnosis not present

## 2018-09-12 DIAGNOSIS — R269 Unspecified abnormalities of gait and mobility: Secondary | ICD-10-CM | POA: Diagnosis not present

## 2018-09-12 DIAGNOSIS — Z902 Acquired absence of lung [part of]: Secondary | ICD-10-CM | POA: Diagnosis not present

## 2018-09-12 DIAGNOSIS — I639 Cerebral infarction, unspecified: Secondary | ICD-10-CM | POA: Diagnosis present

## 2018-09-12 DIAGNOSIS — S299XXA Unspecified injury of thorax, initial encounter: Secondary | ICD-10-CM | POA: Diagnosis not present

## 2018-09-12 DIAGNOSIS — I714 Abdominal aortic aneurysm, without rupture: Secondary | ICD-10-CM | POA: Diagnosis present

## 2018-09-12 DIAGNOSIS — Z8349 Family history of other endocrine, nutritional and metabolic diseases: Secondary | ICD-10-CM

## 2018-09-12 DIAGNOSIS — R251 Tremor, unspecified: Secondary | ICD-10-CM | POA: Diagnosis not present

## 2018-09-12 DIAGNOSIS — IMO0002 Reserved for concepts with insufficient information to code with codable children: Secondary | ICD-10-CM

## 2018-09-12 DIAGNOSIS — I5032 Chronic diastolic (congestive) heart failure: Secondary | ICD-10-CM | POA: Diagnosis not present

## 2018-09-12 DIAGNOSIS — Z825 Family history of asthma and other chronic lower respiratory diseases: Secondary | ICD-10-CM | POA: Diagnosis not present

## 2018-09-12 DIAGNOSIS — E785 Hyperlipidemia, unspecified: Secondary | ICD-10-CM | POA: Diagnosis not present

## 2018-09-12 DIAGNOSIS — R404 Transient alteration of awareness: Secondary | ICD-10-CM | POA: Diagnosis not present

## 2018-09-12 DIAGNOSIS — R0689 Other abnormalities of breathing: Secondary | ICD-10-CM | POA: Diagnosis not present

## 2018-09-12 DIAGNOSIS — F4310 Post-traumatic stress disorder, unspecified: Secondary | ICD-10-CM | POA: Diagnosis present

## 2018-09-12 DIAGNOSIS — J440 Chronic obstructive pulmonary disease with acute lower respiratory infection: Secondary | ICD-10-CM | POA: Diagnosis not present

## 2018-09-12 DIAGNOSIS — R531 Weakness: Secondary | ICD-10-CM | POA: Diagnosis not present

## 2018-09-12 DIAGNOSIS — Z7902 Long term (current) use of antithrombotics/antiplatelets: Secondary | ICD-10-CM

## 2018-09-12 LAB — POCT I-STAT EG7
Acid-Base Excess: 7 mmol/L — ABNORMAL HIGH (ref 0.0–2.0)
Bicarbonate: 33.5 mmol/L — ABNORMAL HIGH (ref 20.0–28.0)
Calcium, Ion: 1.16 mmol/L (ref 1.15–1.40)
HCT: 42 % (ref 39.0–52.0)
Hemoglobin: 14.3 g/dL (ref 13.0–17.0)
O2 Saturation: 89 %
Potassium: 3.9 mmol/L (ref 3.5–5.1)
Sodium: 131 mmol/L — ABNORMAL LOW (ref 135–145)
TCO2: 35 mmol/L — ABNORMAL HIGH (ref 22–32)
pCO2, Ven: 54.3 mmHg (ref 44.0–60.0)
pH, Ven: 7.399 (ref 7.250–7.430)
pO2, Ven: 59 mmHg — ABNORMAL HIGH (ref 32.0–45.0)

## 2018-09-12 LAB — COMPREHENSIVE METABOLIC PANEL
ALT: 19 U/L (ref 0–44)
AST: 21 U/L (ref 15–41)
Albumin: 3.2 g/dL — ABNORMAL LOW (ref 3.5–5.0)
Alkaline Phosphatase: 105 U/L (ref 38–126)
Anion gap: 15 (ref 5–15)
BILIRUBIN TOTAL: 1.2 mg/dL (ref 0.3–1.2)
BUN: 12 mg/dL (ref 8–23)
CO2: 28 mmol/L (ref 22–32)
Calcium: 8.9 mg/dL (ref 8.9–10.3)
Chloride: 90 mmol/L — ABNORMAL LOW (ref 98–111)
Creatinine, Ser: 0.78 mg/dL (ref 0.61–1.24)
GFR calc Af Amer: >60 mL/min (ref >60)
GFR calc non Af Amer: >60 mL/min (ref >60)
Glucose, Bld: 110 mg/dL — ABNORMAL HIGH (ref 70–99)
Potassium: 4.3 mmol/L (ref 3.5–5.1)
Sodium: 133 mmol/L — ABNORMAL LOW (ref 135–145)
TOTAL PROTEIN: 6.6 g/dL (ref 6.5–8.1)

## 2018-09-12 LAB — URINALYSIS, ROUTINE W REFLEX MICROSCOPIC
BILIRUBIN URINE: NEGATIVE
Glucose, UA: NEGATIVE mg/dL
Hgb urine dipstick: NEGATIVE
Ketones, ur: NEGATIVE mg/dL
Leukocytes, UA: NEGATIVE
Nitrite: NEGATIVE
Protein, ur: NEGATIVE mg/dL
SPECIFIC GRAVITY, URINE: 1.036 — AB (ref 1.005–1.030)
pH: 7 (ref 5.0–8.0)

## 2018-09-12 LAB — CBC WITH DIFFERENTIAL/PLATELET
Abs Immature Granulocytes: 0.05 10*3/uL (ref 0.00–0.07)
BASOS ABS: 0 10*3/uL (ref 0.0–0.1)
Basophils Relative: 0 %
Eosinophils Absolute: 0.1 10*3/uL (ref 0.0–0.5)
Eosinophils Relative: 1 %
HCT: 44.4 % (ref 39.0–52.0)
Hemoglobin: 14.5 g/dL (ref 13.0–17.0)
Immature Granulocytes: 1 %
Lymphocytes Relative: 19 %
Lymphs Abs: 1.7 10*3/uL (ref 0.7–4.0)
MCH: 29.1 pg (ref 26.0–34.0)
MCHC: 32.7 g/dL (ref 30.0–36.0)
MCV: 89 fL (ref 80.0–100.0)
Monocytes Absolute: 0.9 10*3/uL (ref 0.1–1.0)
Monocytes Relative: 10 %
NEUTROS PCT: 69 %
NRBC: 0 % (ref 0.0–0.2)
Neutro Abs: 6.5 10*3/uL (ref 1.7–7.7)
Platelets: 278 10*3/uL (ref 150–400)
RBC: 4.99 MIL/uL (ref 4.22–5.81)
RDW: 14.4 % (ref 11.5–15.5)
WBC: 9.2 10*3/uL (ref 4.0–10.5)

## 2018-09-12 LAB — CBG MONITORING, ED: Glucose-Capillary: 70 mg/dL (ref 70–99)

## 2018-09-12 LAB — I-STAT TROPONIN, ED: Troponin i, poc: 0.01 ng/mL (ref 0.00–0.08)

## 2018-09-12 LAB — GLUCOSE, CAPILLARY
Glucose-Capillary: 85 mg/dL (ref 70–99)
Glucose-Capillary: 142 mg/dL — ABNORMAL HIGH (ref 70–99)

## 2018-09-12 LAB — CK: Total CK: 63 U/L (ref 49–397)

## 2018-09-12 LAB — BRAIN NATRIURETIC PEPTIDE: B Natriuretic Peptide: 272 pg/mL — ABNORMAL HIGH (ref 0.0–100.0)

## 2018-09-12 MED ORDER — MORPHINE SULFATE ER 30 MG PO TBCR
60.0000 mg | EXTENDED_RELEASE_TABLET | Freq: Two times a day (BID) | ORAL | Status: DC
  Administered 2018-09-12 – 2018-09-14 (×4): 60 mg via ORAL
  Filled 2018-09-12 (×4): qty 2

## 2018-09-12 MED ORDER — IPRATROPIUM-ALBUTEROL 0.5-2.5 (3) MG/3ML IN SOLN
3.0000 mL | Freq: Three times a day (TID) | RESPIRATORY_TRACT | Status: DC
  Administered 2018-09-13 – 2018-09-14 (×4): 3 mL via RESPIRATORY_TRACT
  Filled 2018-09-12 (×4): qty 3

## 2018-09-12 MED ORDER — ACETAMINOPHEN 650 MG RE SUPP: 650.0000 mg | Freq: Four times a day (QID) | RECTAL | Status: DC | PRN

## 2018-09-12 MED ORDER — ALBUTEROL SULFATE (2.5 MG/3ML) 0.083% IN NEBU
5.0000 mg | INHALATION_SOLUTION | Freq: Once | RESPIRATORY_TRACT | Status: AC
  Administered 2018-09-12: 5 mg via RESPIRATORY_TRACT
  Filled 2018-09-12 (×2): qty 6

## 2018-09-12 MED ORDER — TAMSULOSIN HCL 0.4 MG PO CAPS
0.4000 mg | ORAL_CAPSULE | Freq: Every day | ORAL | Status: DC
  Administered 2018-09-12 – 2018-09-13 (×2): 0.4 mg via ORAL
  Filled 2018-09-12 (×2): qty 1

## 2018-09-12 MED ORDER — TRAZODONE HCL 150 MG PO TABS
300.0000 mg | ORAL_TABLET | Freq: Every day | ORAL | Status: DC
  Administered 2018-09-12 – 2018-09-13 (×2): 300 mg via ORAL
  Filled 2018-09-12 (×2): qty 2

## 2018-09-12 MED ORDER — INSULIN GLARGINE 100 UNIT/ML ~~LOC~~ SOLN
30.0000 [IU] | Freq: Two times a day (BID) | SUBCUTANEOUS | Status: DC
  Administered 2018-09-12 – 2018-09-14 (×4): 30 [IU] via SUBCUTANEOUS
  Filled 2018-09-12 (×6): qty 0.3

## 2018-09-12 MED ORDER — GABAPENTIN 300 MG PO CAPS
300.0000 mg | ORAL_CAPSULE | Freq: Two times a day (BID) | ORAL | Status: DC
  Administered 2018-09-12 – 2018-09-14 (×4): 300 mg via ORAL
  Filled 2018-09-12 (×4): qty 1

## 2018-09-12 MED ORDER — IPRATROPIUM-ALBUTEROL 0.5-2.5 (3) MG/3ML IN SOLN
3.0000 mL | Freq: Four times a day (QID) | RESPIRATORY_TRACT | Status: DC
  Administered 2018-09-12: 3 mL via RESPIRATORY_TRACT
  Filled 2018-09-12: qty 3

## 2018-09-12 MED ORDER — PANTOPRAZOLE SODIUM 40 MG PO TBEC
40.0000 mg | DELAYED_RELEASE_TABLET | Freq: Every day | ORAL | Status: DC
  Administered 2018-09-13 – 2018-09-14 (×2): 40 mg via ORAL
  Filled 2018-09-12 (×2): qty 1

## 2018-09-12 MED ORDER — ATORVASTATIN CALCIUM 10 MG PO TABS
10.0000 mg | ORAL_TABLET | ORAL | Status: DC
  Administered 2018-09-12 – 2018-09-14 (×2): 10 mg via ORAL
  Filled 2018-09-12 (×2): qty 1

## 2018-09-12 MED ORDER — ACETAMINOPHEN 325 MG PO TABS: 650.0000 mg | ORAL_TABLET | Freq: Four times a day (QID) | ORAL | Status: DC | PRN

## 2018-09-12 MED ORDER — SODIUM CHLORIDE 0.9 % IV BOLUS: 1000.0000 mL | Freq: Once | INTRAVENOUS | Status: DC

## 2018-09-12 MED ORDER — INSULIN ASPART 100 UNIT/ML ~~LOC~~ SOLN
5.0000 [IU] | Freq: Three times a day (TID) | SUBCUTANEOUS | Status: DC
  Administered 2018-09-13: 5 [IU] via SUBCUTANEOUS

## 2018-09-12 MED ORDER — IPRATROPIUM BROMIDE 0.02 % IN SOLN
0.5000 mg | Freq: Once | RESPIRATORY_TRACT | Status: AC
  Administered 2018-09-12: 0.5 mg via RESPIRATORY_TRACT
  Filled 2018-09-12 (×2): qty 2.5

## 2018-09-12 MED ORDER — INSULIN ASPART 100 UNIT/ML ~~LOC~~ SOLN: 0.0000 [IU] | Freq: Every day | SUBCUTANEOUS | Status: DC

## 2018-09-12 MED ORDER — LEVOTHYROXINE SODIUM 25 MCG PO TABS
125.0000 ug | ORAL_TABLET | Freq: Every day | ORAL | Status: DC
  Administered 2018-09-13 – 2018-09-14 (×2): 125 ug via ORAL
  Filled 2018-09-12 (×2): qty 1

## 2018-09-12 MED ORDER — VENLAFAXINE HCL ER 75 MG PO CP24
150.0000 mg | ORAL_CAPSULE | Freq: Two times a day (BID) | ORAL | Status: DC
  Administered 2018-09-12 – 2018-09-14 (×4): 150 mg via ORAL
  Filled 2018-09-12 (×4): qty 2

## 2018-09-12 MED ORDER — METHYLPREDNISOLONE SODIUM SUCC 125 MG IJ SOLR
125.0000 mg | Freq: Once | INTRAMUSCULAR | Status: AC
  Administered 2018-09-12: 125 mg via INTRAVENOUS
  Filled 2018-09-12: qty 2

## 2018-09-12 MED ORDER — INSULIN ASPART 100 UNIT/ML ~~LOC~~ SOLN
0.0000 [IU] | Freq: Three times a day (TID) | SUBCUTANEOUS | Status: DC
  Administered 2018-09-13: 1 [IU] via SUBCUTANEOUS

## 2018-09-12 MED ORDER — BUSPIRONE HCL 10 MG PO TABS
10.0000 mg | ORAL_TABLET | Freq: Two times a day (BID) | ORAL | Status: DC
  Administered 2018-09-12 – 2018-09-14 (×4): 10 mg via ORAL
  Filled 2018-09-12 (×5): qty 1

## 2018-09-12 MED ORDER — ALBUTEROL SULFATE (2.5 MG/3ML) 0.083% IN NEBU: 2.5000 mg | INHALATION_SOLUTION | RESPIRATORY_TRACT | Status: DC | PRN

## 2018-09-12 MED ORDER — IOPAMIDOL (ISOVUE-370) INJECTION 76%
INTRAVENOUS | Status: AC
  Administered 2018-09-12: 100 mL
  Filled 2018-09-12: qty 100

## 2018-09-12 MED ORDER — NALOXEGOL OXALATE 25 MG PO TABS
25.0000 mg | ORAL_TABLET | Freq: Every day | ORAL | Status: DC
  Administered 2018-09-13 – 2018-09-14 (×2): 25 mg via ORAL
  Filled 2018-09-12 (×2): qty 1

## 2018-09-12 NOTE — ED Notes (Signed)
Admitting Provide Dr. Sloan Leiter at bedside.

## 2018-09-12 NOTE — ED Triage Notes (Addendum)
Pt brought in by ems for c/o aloc/generalized weakness/increased falls/ tremmors/jerking motions for the past week ; pt denies any KO or hitting his head ; pt alert and oriented x 3; pt unable to recall what year it is and per family that is not his norma;; family members states that he had a recent MRI since these symptoms started and they were told everything was negative; pt also is currently being treated for a wound to the right leg and went to wound clinic yesterday and was told everything was normal

## 2018-09-12 NOTE — H&P (Signed)
History and Physical    Brad Taylor DDU:[id number] DOB: 1950/09/10 DOA: 09/12/2018  PCP: [name], MD  Patient coming from: home   I have personally briefly reviewed patient's old medical records available.   Chief Complaint: Frequent falls, lethargy, tremors and unintentional movements of the arms and legs.  HPI: Brad Taylor is a 68 y.o. male with medical history significant of extensive medical problems including AAA, COPD, chronic pain problems on narcotics, depression, PTSD, diabetes type 2 on insulin, hyperlipidemia, hypothyroidism, stroke with right-sided hemiparesis, right lower leg chronic wound who presents to the emergency room with recently worsening shortness of breath, lethargy, falls and unintentional movements of the arms and legs.  Patient is poor historian.  He only tells me that he feels weak and he cannot walk around well.  He also has this unintentional shaking movements of the arms and legs.  He says he is short of breath and wheezing after he quit his smoking about a month ago.  History is taken by patient's wife and son at the bedside in details.  Patient is a [place] veteran, exposed to agent orange, follows up at [place].  He retired as Cabin crew.  He has recently been suffering from right leg wound and follows up at wound care clinic.  For about a month now and worse for 2 weeks he is having these tremors which are frequent, intermittent and gradually worsening in severity and frequency where he had jerking movements of his both hands and legs.  Patient also had multiple falls within this time.  At home he has been falling, he has been unsteady and had to be helped by family members since last 2 weeks.  Patient was independent until a month ago.  Patient is awake during these jerking movements.  He feels irritated.  No history of syncope, no history of passing out spells.  They have not noticed any weakness on his extremities or facial weakness.  He does have  slight right hemiparesis from the stroke.  They have been following up with primary care physician and recently underwent MRI of the brain that shows no acute abnormality.  He does have advanced small vessel chronic ischemic disease and left brainstem degeneration.  Has history of left brainstem stroke with right hemiparesis.  ED Course: Hemodynamically stable in the ER.  Blood pressures are stable.  88% on room air corrected to 96% on 2 L.  Electrolytes are normal.  Underwent CT angiogram of the chest and abdomen that did show a stable aneurysms, no new findings.  CT head is normal.  Patient was wheezing on presentation, was given bronchodilators and steroids with some improvement on wheezing.  Review of Systems: As per HPI otherwise 10 point review of systems negative.    Past Medical History:  Diagnosis Date  . AAA (abdominal aortic aneurysm) ([place]) 5/15   3.5x3.5 cm  . COPD (chronic obstructive pulmonary disease) ([place])   . Depression   . Diabetes mellitus without complication ([place])   . GERD (gastroesophageal reflux disease)   . Hyperlipidemia   . Hypothyroidism   . Post traumatic stress disorder   . Stroke ([place])   . Thyroid disease   . Ulcer     Past Surgical History:  Procedure Laterality Date  . ABDOMINAL AORTOGRAM W/LOWER EXTREMITY N/A 08/20/2017   Procedure: ABDOMINAL AORTOGRAM W/LOWER EXTREMITY;  Surgeon: Waynetta Sandy, MD;  Location: [facility];  Service: Cardiovascular;  Laterality: N/A;  . CATARACT EXTRACTION    .  CHOLECYSTECTOMY    . ENDOVENOUS ABLATION SAPHENOUS VEIN W/ LASER Right 03/18/2018   endovenous laser ablation R GSV by Ruta Hinds MD   . ENDOVENOUS ABLATION SAPHENOUS VEIN W/ LASER Right 04/08/2018   endovenous laser ablation R SSV by Ruta Hinds MD   . [place]    . LUNG REMOVAL, PARTIAL     right, 2 years ago at [facility] for cancer  . PERIPHERAL VASCULAR ATHERECTOMY Right 08/20/2017   Procedure: PERIPHERAL VASCULAR ATHERECTOMY;   Surgeon: Waynetta Sandy, MD;  Location: [facility];  Service: Cardiovascular;  Laterality: Right;  posterior tibial and tibeoperoneal trunk  . PERIPHERAL VASCULAR BALLOON ANGIOPLASTY Right 08/20/2017   Procedure: PERIPHERAL VASCULAR BALLOON ANGIOPLASTY;  Surgeon: Waynetta Sandy, MD;  Location: [facility];  Service: Cardiovascular;  Laterality: Right;  Anterior tibial  . VIDEO BRONCHOSCOPY Bilateral 12/30/2017   Procedure: VIDEO BRONCHOSCOPY WITHOUT FLUORO;  Surgeon: Marshell Garfinkel, MD;  Location: [facility];  Service: Cardiopulmonary;  Laterality: Bilateral;     reports that he has been smoking cigarettes. He has a 30.00 pack-year smoking history. He quit smokeless tobacco use about 5 years ago. He reports that he does not drink alcohol or use drugs.  No Known Allergies  Family History  Problem Relation Age of Onset  . Emphysema Father   . Thyroid disease Sister   . Stroke Maternal Grandmother   . Thyroid disease Sister   . Thyroid disease Sister      Prior to Admission medications   Medication Sig Start Date End Date Taking? Authorizing Provider  atorvastatin (LIPITOR) 10 MG tablet Take 10 mg by mouth every other day.    Yes [provider]  busPIRone (BUSPAR) 10 MG tablet Take 0.5 tablets (5 mg total) by mouth 2 (two) times daily as needed (for anxiety). Patient taking differently: Take 10 mg by mouth 2 (two) times daily.  10/08/13  Yes Angiulli, Lavon Paganini, PA-C  clopidogrel (PLAVIX) 75 MG tablet Take 1 tablet (75 mg total) by mouth daily with breakfast. 02/01/14  Yes [name], NP  gabapentin (NEURONTIN) 300 MG capsule Take 1 capsule (300 mg total) by mouth 3 (three) times daily. Patient taking differently: Take 300 mg by mouth 2 (two) times daily.  11/07/17  Yes [name], MD  insulin glargine (LANTUS) 100 UNIT/ML injection Inject 45-55 Units into the skin 2 (two) times daily. Take 45 units in the morning and 55 units at night  10/08/13  Yes Angiulli, Lavon Paganini, PA-C  insulin regular (HUMULIN R) 100 units/mL injection Inject 0.02 mLs (2 Units total) into the skin 3 (three) times daily before meals. 08/13/18  Yes [name], MD  levothyroxine (SYNTHROID, LEVOTHROID) 125 MCG tablet Take 1 tablet (125 mcg total) by mouth daily before breakfast. 10/08/13  Yes Angiulli, Lavon Paganini, PA-C  metFORMIN (GLUCOPHAGE) 500 MG tablet Take 500 mg by mouth 2 (two) times daily with a meal.   Yes [provider]  morphine (MS CONTIN) 100 MG 12 hr tablet Take 100 mg by mouth every 12 (twelve) hours as needed for pain.    Yes [provider]  MOVANTIK 25 MG TABS tablet TAKE 1 TABLET BY MOUTH EVERY DAY 04/23/18  Yes [name], MD  oxymetazoline (AFRIN) 0.05 % nasal spray Place 2 sprays into both nostrils 2 (two) times daily as needed for congestion.   Yes [provider]  RABEprazole (ACIPHEX) 20 MG tablet Take 2 tablets (40 mg total) by  mouth daily before breakfast. 10/08/13  Yes Angiulli, Lavon Paganini, PA-C  tamsulosin (FLOMAX) 0.4 MG CAPS capsule Take 0.4 mg by mouth at bedtime.    Yes [provider]  Tiotropium Bromide-Olodaterol (STIOLTO RESPIMAT) 2.5-2.5 MCG/ACT AERS Inhale 2 Inhalers into the lungs daily. Patient taking differently: Inhale 2 puffs into the lungs daily.  07/09/18  Yes [name], MD  traZODone (DESYREL) 100 MG tablet Take 3 tablets (300 mg total) by mouth at bedtime. 10/08/13  Yes Angiulli, Lavon Paganini, PA-C  venlafaxine XR (EFFEXOR-XR) 150 MG 24 hr capsule Take 1 capsule (150 mg total) by mouth daily with breakfast. Patient taking differently: Take 150 mg by mouth 2 (two) times daily.  10/08/13  Yes Angiulli, Lavon Paganini, PA-C  albuterol (PROVENTIL HFA;VENTOLIN HFA) 108 (90 Base) MCG/ACT inhaler INHALE 2 PUFFS INTO THE LUNGS EVERY 6 HOURS AS NEEDED FOR WHEEZE OR SHORTNESS OF BREATH 12/16/17   [name], MD    Physical Exam: Vitals:   09/12/18 1226 09/12/18 1228 09/12/18 1330  09/12/18 1430  BP:   (!) 150/91 (!) 163/95  Pulse:   (!) 52 63  Resp:   (!) 22 (!) 21  Temp:      TempSrc:      SpO2:  96% 95% 94%  Weight: 110 kg     Height: 5\' 10"  (1.778 m)       Constitutional: NAD, calm, comfortable Vitals:   09/12/18 1226 09/12/18 1228 09/12/18 1330 09/12/18 1430  BP:   (!) 150/91 (!) 163/95  Pulse:   (!) 52 63  Resp:   (!) 22 (!) 21  Temp:      TempSrc:      SpO2:  96% 95% 94%  Weight: 110 kg     Height: 5\' 10"  (1.778 m)      Eyes: PERRL, lids and conjunctivae normal Not any distress.  Chronically sick looking. ENMT: Mucous membranes are moist. Posterior pharynx clear of any exudate or lesions.Normal dentition.  Neck: normal, supple, no masses, no thyromegaly Respiratory: clear to auscultation bilaterally status post bronchodilator therapy, no wheezing, no crackles. Normal respiratory effort. No accessory muscle use.  Cardiovascular: Regular rate and rhythm, no murmurs / rubs / gallops. No extremity edema. 2+ pedal pulses. No carotid bruits.  Abdomen: no tenderness, no masses palpated. No hepatosplenomegaly. Bowel sounds positive.  Obese. Musculoskeletal: no clubbing / cyanosis. No joint deformity upper and lower extremities. Good ROM, no contractures. Normal muscle tone.  Right leg has Unna boot applied yesterday.  Distal neurovascular status intact. Skin: no rashes, lesions, ulcers. No induration Neurologic: CN 2-12 grossly intact. Sensation intact, DTR normal.  He has generalized weakness.  Motor power is equal in all extremities. Psychiatric: Lethargic.  Alert and oriented x 3.  Flat affect.  Witnessed jerking movements of his left hand that lasted about 2 or 3 seconds, 2 such episodes in the ER.    Labs on Admission: I have personally reviewed following labs and imaging studies  CBC: Recent Labs  Lab 09/12/18 1221 09/12/18 1312  WBC 9.2  --   NEUTROABS 6.5  --   HGB 14.5 14.3  HCT 44.4 42.0  MCV 89.0  --   PLT 278  --    Basic  Metabolic Panel: Recent Labs  Lab 09/12/18 1221 09/12/18 1312  NA 133* 131*  K 4.3 3.9  CL 90*  --   CO2 28  --   GLUCOSE 110*  --   BUN 12  --  CREATININE 0.78  --   CALCIUM 8.9  --    GFR: Estimated Creatinine Clearance: 108.2 mL/min (by C-G formula based on SCr of 0.78 mg/dL). Liver Function Tests: Recent Labs  Lab 09/12/18 1221  AST 21  ALT 19  ALKPHOS 105  BILITOT 1.2  PROT 6.6  ALBUMIN 3.2*   No results for input(s): LIPASE, AMYLASE in the last 168 hours. No results for input(s): AMMONIA in the last 168 hours. Coagulation Profile: No results for input(s): INR, PROTIME in the last 168 hours. Cardiac Enzymes: Recent Labs  Lab 09/12/18 1221  CKTOTAL 63   BNP (last 3 results) No results for input(s): PROBNP in the last 8760 hours. HbA1C: No results for input(s): HGBA1C in the last 72 hours. CBG: No results for input(s): GLUCAP in the last 168 hours. Lipid Profile: No results for input(s): CHOL, HDL, LDLCALC, TRIG, CHOLHDL, LDLDIRECT in the last 72 hours. Thyroid Function Tests: No results for input(s): TSH, T4TOTAL, FREET4, T3FREE, THYROIDAB in the last 72 hours. Anemia Panel: No results for input(s): VITAMINB12, FOLATE, FERRITIN, TIBC, IRON, RETICCTPCT in the last 72 hours. Urine analysis:    Component Value Date/Time   COLORURINE YELLOW 09/12/2018 1532   APPEARANCEUR CLEAR 09/12/2018 1532   LABSPEC 1.036 (H) 09/12/2018 1532   PHURINE 7.0 09/12/2018 1532   GLUCOSEU NEGATIVE 09/12/2018 1532   HGBUR NEGATIVE 09/12/2018 [place] 09/12/2018 1532   KETONESUR NEGATIVE 09/12/2018 1532   PROTEINUR NEGATIVE 09/12/2018 1532   UROBILINOGEN 1.0 09/25/2013 1009   NITRITE NEGATIVE 09/12/2018 [place] 09/12/2018 1532    Radiological Exams on Admission: Dg Chest 2 View  Result Date: 09/12/2018 CLINICAL DATA:  Altered mental status.  Recent fall EXAM: CHEST - 2 VIEW COMPARISON:  Chest CT October 28, 2017 and chest radiograph  November 03, 2017 FINDINGS: There is chronic scarring and volume loss on the right with elevation the right hemidiaphragm. There is no well-defined mass currently evident in this area of scarring and retraction. The left lung is clear. Heart size is normal. Pulmonary vascularity on the left is normal. There is distortion of pulmonary vascularity on the right due to the scarring. There is aortic atherosclerosis. No bone lesions evident. IMPRESSION: Persistent scarring on the right with volume loss and retraction in the perihilar region. Next chronic elevation of right hemidiaphragm is stable. Left lung is clear. Stable cardiac silhouette. There is aortic atherosclerosis. Electronically Signed   By: Lowella Grip III M.D.   On: 09/12/2018 13:40   Ct Head Wo Contrast  Result Date: 09/12/2018 CLINICAL DATA:  68 year old male with generalized weakness, increased falls, tremors, jerking motions EXAM: CT HEAD WITHOUT CONTRAST CT CERVICAL SPINE WITHOUT CONTRAST TECHNIQUE: Multidetector CT imaging of the head and cervical spine was performed following the standard protocol without intravenous contrast. Multiplanar CT image reconstructions of the cervical spine were also generated. COMPARISON:  Recent prior brain MRI 09/05/2018 FINDINGS: CT HEAD FINDINGS Brain: No evidence of acute infarction, hemorrhage, hydrocephalus, extra-axial collection or mass lesion/mass effect. Confluent areas of periventricular white matter hypotension with more patchy areas of subcortical and deep white matter hypoattenuation remain nonspecific but most consistent with chronic microvascular ischemic white matter disease. Small lacunar infarct in the left caudate head. Vascular: No hyperdense vessel or unexpected calcification. Skull: Normal. Negative for fracture or focal lesion. Sinuses/Orbits: No acute finding. Other: None. CT CERVICAL SPINE FINDINGS Alignment: Normal. Skull base and vertebrae: No acute fracture. No primary bone lesion or  focal pathologic process.  Soft tissues and spinal canal: No prevertebral fluid or swelling. No visible canal hematoma. Disc levels: Minimal multilevel cervical spondylosis. No level specific disease. Upper chest: Negative. Other: None IMPRESSION: CT HEAD 1. No acute intracranial abnormality. 2. Similar appearance of extensive chronic microvascular ischemic white matter disease. 3. Stable remote prior left caudate lacunar infarct. CT CSPINE 1. No acute fracture or malalignment. Electronically Signed   By: Jacqulynn Cadet M.D.   On: 09/12/2018 14:28   Ct Angio Chest Pe W And/or Wo Contrast  Result Date: 09/12/2018 CLINICAL DATA:  68 year old male with a nebulous constellation of symptoms including generalized weakness, increased falls, tremors, jerking motions. EXAM: CT ANGIOGRAPHY CHEST CT ABDOMEN AND PELVIS WITH CONTRAST TECHNIQUE: Multidetector CT imaging of the chest was performed using the standard protocol during bolus administration of intravenous contrast. Multiplanar CT image reconstructions and MIPs were obtained to evaluate the vascular anatomy. Multidetector CT imaging of the abdomen and pelvis was performed using the standard protocol during bolus administration of intravenous contrast. CONTRAST:  151mL ISOVUE-370 IOPAMIDOL (ISOVUE-370) INJECTION 76% COMPARISON:  Most recent prior CT scan of the chest 10/28/2017; prior CT scan of the abdomen and pelvis 12/10/2013 FINDINGS: CTA CHEST FINDINGS Cardiovascular: Adequate opacification of the pulmonary arteries to the lobar level. No evidence of central filling defect to suggest acute pulmonary embolus. The main pulmonary artery is enlarged at 4.3 cm in diameter. Calcifications present throughout the aorta. Stable mild aneurysmal dilatation of the ascending thoracic aorta with a maximal diameter of 4.3 cm. Thickened and calcified aortic valve. Calcifications throughout the coronary arteries. Cardiomegaly. No pericardial effusion. Mediastinum/Nodes:  Unremarkable CT appearance of the thyroid gland. No suspicious mediastinal or hilar adenopathy. No soft tissue mediastinal mass. The thoracic esophagus is unremarkable. Lungs/Pleura: Surgical changes suggest prior right upper lobectomy. Chronic atelectasis and scarring is present in the medial aspect of the right middle lobe. No definite masslike area to suggest recurrent tumor. No significant interval change compared to prior imaging from March of 2019. Slightly increased respiratory motion artifact and dependent atelectasis. Musculoskeletal: No acute fracture or aggressive appearing lytic or blastic osseous lesion. Review of the MIP images confirms the above findings. CT ABDOMEN and PELVIS FINDINGS Hepatobiliary: No focal liver abnormality is seen. Status post cholecystectomy. No biliary dilatation. Pancreas: Unremarkable. No pancreatic ductal dilatation or surrounding inflammatory changes. Spleen: Normal in size without focal abnormality. Adrenals/Urinary Tract: Normal adrenal glands. Extensive motion and streak artifact limits evaluation of the kidneys. Multiple circumscribed low-attenuation lesions demonstrate no significant interval change dating back to 2015 consistent with simple cysts. Nonspecific perinephric stranding bilaterally. No hydronephrosis or nephrolithiasis. Stomach/Bowel: Large colonic stool burden suggests constipation. No evidence of focal bowel wall thickening or bowel obstruction. Normal appendix visualized. Vascular/Lymphatic: Extensive atherosclerotic vascular calcifications throughout the abdominal aorta. Mild aneurysmal dilatation with a maximal diameter of 3.3 cm, unchanged. Aneurysmal dilatation of the left common iliac artery to 2.6 cm and the right common iliac artery to 2.8 cm, perhaps slightly enlarged compared to 2.4 and 2.5 cm respectively in 2015. Reproductive: Prostate is unremarkable. Other: No abdominal wall hernia or abnormality. No abdominopelvic ascites. Musculoskeletal:  No acute fracture or aggressive appearing lytic or blastic osseous lesion. Review of the MIP images confirms the above findings. IMPRESSION: CTA CHEST 1. No evidence of acute pulmonary embolus, pneumonia or other acute cardiopulmonary process. 2. Surgical changes of prior right upper lobectomy with residual scarring but no convincing evidence of recurrent disease. 3. Enlarged main pulmonary artery suggesting underlying pulmonary arterial hypertension. 4. Stable thoracic  aortic aneurysm with a maximal diameter of 4.3 cm. Recommend annual imaging followup by CTA or MRA. This recommendation follows 2010 ACCF/AHA/AATS/ACR/ASA/SCA/SCAI/SIR/STS/SVM Guidelines for the Diagnosis and Management of Patients with Thoracic Aortic Disease. Circulation. 2010; 121: E423-N361. Aortic aneurysm NOS (ICD10-I71.9). 5. Thickened and calcified aortic valve raises concern for underlying aortic valvular stenosis. 6. Cardiomegaly with coronary artery disease. CTA ABD/PELVIS 1. No acute abnormality within the abdomen or pelvis. 2. Extensive artifact related to patient motion and streak from arms down positioning. 3. Large colonic stool burden suggesting constipation. 4. Stable mild aneurysmal dilatation of the infrarenal abdominal aorta at 3.3 cm. Recommend followup by ultrasound in 3 years. This recommendation follows ACR consensus guidelines: White Paper of the ACR Incidental Findings Committee II on Vascular Findings. J Am Coll Radiol 2013; 10:789-794. 5. Slight interval progression (compared to 2015) of bilateral common iliac artery aneurysms measuring up to 2.6 cm on the left and 2.8 cm on the right. 6. Extensive peripheral arterial disease in the visualized iliac and femoral arteries. Could patient's lower extremity symptoms be secondary to arterial insufficiency? Electronically Signed   By: Jacqulynn Cadet M.D.   On: 09/12/2018 14:43   Ct Cervical Spine Wo Contrast  Result Date: 09/12/2018 CLINICAL DATA:  68 year old male with  generalized weakness, increased falls, tremors, jerking motions EXAM: CT HEAD WITHOUT CONTRAST CT CERVICAL SPINE WITHOUT CONTRAST TECHNIQUE: Multidetector CT imaging of the head and cervical spine was performed following the standard protocol without intravenous contrast. Multiplanar CT image reconstructions of the cervical spine were also generated. COMPARISON:  Recent prior brain MRI 09/05/2018 FINDINGS: CT HEAD FINDINGS Brain: No evidence of acute infarction, hemorrhage, hydrocephalus, extra-axial collection or mass lesion/mass effect. Confluent areas of periventricular white matter hypotension with more patchy areas of subcortical and deep white matter hypoattenuation remain nonspecific but most consistent with chronic microvascular ischemic white matter disease. Small lacunar infarct in the left caudate head. Vascular: No hyperdense vessel or unexpected calcification. Skull: Normal. Negative for fracture or focal lesion. Sinuses/Orbits: No acute finding. Other: None. CT CERVICAL SPINE FINDINGS Alignment: Normal. Skull base and vertebrae: No acute fracture. No primary bone lesion or focal pathologic process. Soft tissues and spinal canal: No prevertebral fluid or swelling. No visible canal hematoma. Disc levels: Minimal multilevel cervical spondylosis. No level specific disease. Upper chest: Negative. Other: None IMPRESSION: CT HEAD 1. No acute intracranial abnormality. 2. Similar appearance of extensive chronic microvascular ischemic white matter disease. 3. Stable remote prior left caudate lacunar infarct. CT CSPINE 1. No acute fracture or malalignment. Electronically Signed   By: Jacqulynn Cadet M.D.   On: 09/12/2018 14:28   Ct Abdomen Pelvis W Contrast  Result Date: 09/12/2018 CLINICAL DATA:  68 year old male with a nebulous constellation of symptoms including generalized weakness, increased falls, tremors, jerking motions. EXAM: CT ANGIOGRAPHY CHEST CT ABDOMEN AND PELVIS WITH CONTRAST TECHNIQUE:  Multidetector CT imaging of the chest was performed using the standard protocol during bolus administration of intravenous contrast. Multiplanar CT image reconstructions and MIPs were obtained to evaluate the vascular anatomy. Multidetector CT imaging of the abdomen and pelvis was performed using the standard protocol during bolus administration of intravenous contrast. CONTRAST:  171mL ISOVUE-370 IOPAMIDOL (ISOVUE-370) INJECTION 76% COMPARISON:  Most recent prior CT scan of the chest 10/28/2017; prior CT scan of the abdomen and pelvis 12/10/2013 FINDINGS: CTA CHEST FINDINGS Cardiovascular: Adequate opacification of the pulmonary arteries to the lobar level. No evidence of central filling defect to suggest acute pulmonary embolus. The main pulmonary artery is enlarged  at 4.3 cm in diameter. Calcifications present throughout the aorta. Stable mild aneurysmal dilatation of the ascending thoracic aorta with a maximal diameter of 4.3 cm. Thickened and calcified aortic valve. Calcifications throughout the coronary arteries. Cardiomegaly. No pericardial effusion. Mediastinum/Nodes: Unremarkable CT appearance of the thyroid gland. No suspicious mediastinal or hilar adenopathy. No soft tissue mediastinal mass. The thoracic esophagus is unremarkable. Lungs/Pleura: Surgical changes suggest prior right upper lobectomy. Chronic atelectasis and scarring is present in the medial aspect of the right middle lobe. No definite masslike area to suggest recurrent tumor. No significant interval change compared to prior imaging from March of 2019. Slightly increased respiratory motion artifact and dependent atelectasis. Musculoskeletal: No acute fracture or aggressive appearing lytic or blastic osseous lesion. Review of the MIP images confirms the above findings. CT ABDOMEN and PELVIS FINDINGS Hepatobiliary: No focal liver abnormality is seen. Status post cholecystectomy. No biliary dilatation. Pancreas: Unremarkable. No pancreatic  ductal dilatation or surrounding inflammatory changes. Spleen: Normal in size without focal abnormality. Adrenals/Urinary Tract: Normal adrenal glands. Extensive motion and streak artifact limits evaluation of the kidneys. Multiple circumscribed low-attenuation lesions demonstrate no significant interval change dating back to 2015 consistent with simple cysts. Nonspecific perinephric stranding bilaterally. No hydronephrosis or nephrolithiasis. Stomach/Bowel: Large colonic stool burden suggests constipation. No evidence of focal bowel wall thickening or bowel obstruction. Normal appendix visualized. Vascular/Lymphatic: Extensive atherosclerotic vascular calcifications throughout the abdominal aorta. Mild aneurysmal dilatation with a maximal diameter of 3.3 cm, unchanged. Aneurysmal dilatation of the left common iliac artery to 2.6 cm and the right common iliac artery to 2.8 cm, perhaps slightly enlarged compared to 2.4 and 2.5 cm respectively in 2015. Reproductive: Prostate is unremarkable. Other: No abdominal wall hernia or abnormality. No abdominopelvic ascites. Musculoskeletal: No acute fracture or aggressive appearing lytic or blastic osseous lesion. Review of the MIP images confirms the above findings. IMPRESSION: CTA CHEST 1. No evidence of acute pulmonary embolus, pneumonia or other acute cardiopulmonary process. 2. Surgical changes of prior right upper lobectomy with residual scarring but no convincing evidence of recurrent disease. 3. Enlarged main pulmonary artery suggesting underlying pulmonary arterial hypertension. 4. Stable thoracic aortic aneurysm with a maximal diameter of 4.3 cm. Recommend annual imaging followup by CTA or MRA. This recommendation follows 2010 ACCF/AHA/AATS/ACR/ASA/SCA/SCAI/SIR/STS/SVM Guidelines for the Diagnosis and Management of Patients with Thoracic Aortic Disease. Circulation. 2010; 121: W546-E703. Aortic aneurysm NOS (ICD10-I71.9). 5. Thickened and calcified aortic valve  raises concern for underlying aortic valvular stenosis. 6. Cardiomegaly with coronary artery disease. CTA ABD/PELVIS 1. No acute abnormality within the abdomen or pelvis. 2. Extensive artifact related to patient motion and streak from arms down positioning. 3. Large colonic stool burden suggesting constipation. 4. Stable mild aneurysmal dilatation of the infrarenal abdominal aorta at 3.3 cm. Recommend followup by ultrasound in 3 years. This recommendation follows ACR consensus guidelines: White Paper of the ACR Incidental Findings Committee II on Vascular Findings. J Am Coll Radiol 2013; 10:789-794. 5. Slight interval progression (compared to 2015) of bilateral common iliac artery aneurysms measuring up to 2.6 cm on the left and 2.8 cm on the right. 6. Extensive peripheral arterial disease in the visualized iliac and femoral arteries. Could patient's lower extremity symptoms be secondary to arterial insufficiency? Electronically Signed   By: Jacqulynn Cadet M.D.   On: 09/12/2018 14:43    EKG: Independently reviewed.  Shows normal sinus rhythm with bigeminy, present on previous EKGs.  Assessment/Plan Principal Problem:   COPD with acute bronchitis ([place]) Active Problems:   Stroke ([place])  Aphasia as late effect of stroke   Hemiplegia and hemiparesis following unspecified cerebrovascular disease affecting unspecified side (HCC)   Hypothyroidism   Chronic diastolic CHF (congestive heart failure) (HCC)   H/O: lung cancer   Myoclonic jerking   Leg wound, right   COPD exacerbation ([place])     1.  COPD with acute exacerbation: Agree with admission to monitored unit because of severity of symptoms. Aggressive bronchodilator therapy, scheduled and as needed bronchodilators, deep breathing exercises, incentive spirometry, chest physiotherapy and respiratory therapy consult. Will not use steroids at this time as patient has brittle diabetes. No indication for use of antibiotics. Supplemental oxygen to  keep saturations more than 92%. Patient will need ambulatory oxygen with underlying history of COPD and pulmonary hypertension.  This needs to be evaluated when he is ready to be discharged.  2.  Rapidly progressing cognitive dysfunction along with frequent myoclonic jerks: Cause unknown.  May have partial seizure.  May have other CNS causes.  May be sequela of stroke. I called and discussed case with neurology.  Will not send any investigations as neurology may be sending more investigations. Patient has progressively worsening physical debility, he will work with PT OT and speech therapy to optimize his function.  He may benefit with rehab.  3.  Type 2 diabetes on insulin: We will continue with reduced dose of long-acting insulin.  Keep on prandial insulin and sliding scale insulin.  Will check A1c.  4.  Hypothyroidism: Clinically euthyroid.  Will check TSH.  Continue Synthroid.  5.  Chronic opiate use: Patient is on high doses of long-acting morphine.  Will decrease dose to 60% and monitor.  6.  PTSD and depression: On multiple SSRI and SNRI that he will continue.  7.  History of a stroke with right hemiparesis: History of left brainstem stroke.  No evidence of new stroke.  He is on Plavix, will hold today as anticipating an lumbar puncture.  Will resume after procedure.  8.  Chronic venous stasis wound right leg: Followed by wound care.  Dressing changed 09/11/2018.  If he stays here more than 3 days, will need wound care consult to change dressing.  I called and discussed case with neurology.  They are anticipating whether patient will need a lumbar puncture.  DVT prophylaxis: SCDs for now. Code Status: Full code. Family Communication: Wife and son at the bedside. Disposition Plan: Home with home health care versus skilled nursing facility. Consults called: Neurology. Admission status: Inpatient.   Barb Merino MD [facility] Pager [phone]  If 7PM-7AM, please  contact night-coverage [email or web address] Password [facility]  09/12/2018, 3:57 PM

## 2018-09-12 NOTE — Progress Notes (Signed)
Came to room for breathing tx, pt is out of the room.

## 2018-09-12 NOTE — ED Notes (Signed)
Patient transported to X-ray 

## 2018-09-12 NOTE — Consult Note (Signed)
Neurology Consultation  Reason for Consult: Seizure like activity  Referring Physician: Dr. Dante Gang   CC: Seizure   History is obtained from: Wife and son at the bedside, chart review and patient   HPI: Brad Taylor is a 68 y.o. male COPD, diabetes, diabetic neuropathy remote  thoracic and aortic aneurysms, falls most recently one week before and yesterday.    The patient presents today with suspected seizure. Patient's son states that he got his father up and he began to jerk violently with arm and leg. Eyes were unfocused as "though they were looking through you". He had been having incontinent episodes in the last 3-4 months but not associated with seizure.   Apparently about 2 weeks ago he was seen by his PCP who was considering starting him on an AED for suspected seizure. He has an appointment scheduled with neurology. After yesterday's episode he seemed wore out, confused, and overall out of it. He'd been disoriented all day today. He was with O2 sat of 88% in triage.   ROS: A 14 point ROS was performed and is negative except as noted in the HPI.    Past Medical History:  Diagnosis Date  . AAA (abdominal aortic aneurysm) ([place]) 5/15   3.5x3.5 cm  . COPD (chronic obstructive pulmonary disease) ([place])   . Depression   . Diabetes mellitus without complication ([place])   . GERD (gastroesophageal reflux disease)   . Hyperlipidemia   . Hypothyroidism   . Post traumatic stress disorder   . Stroke ([place])   . Thyroid disease   . Ulcer       Family History  Problem Relation Age of Onset  . Emphysema Father   . Thyroid disease Sister   . Stroke Maternal Grandmother   . Thyroid disease Sister   . Thyroid disease Sister     Social History:   reports that he has been smoking cigarettes. He has a 30.00 pack-year smoking history. He quit smokeless tobacco use about 5 years ago. He reports that he does not drink alcohol or use drugs.  Medications No current facility-administered  medications for this encounter.   Current Outpatient Medications:  .  atorvastatin (LIPITOR) 10 MG tablet, Take 10 mg by mouth every other day. , Disp: , Rfl:  .  busPIRone (BUSPAR) 10 MG tablet, Take 0.5 tablets (5 mg total) by mouth 2 (two) times daily as needed (for anxiety). (Patient taking differently: Take 10 mg by mouth 2 (two) times daily. ), Disp: 60 tablet, Rfl: 0 .  clopidogrel (PLAVIX) 75 MG tablet, Take 1 tablet (75 mg total) by mouth daily with breakfast., Disp: 90 tablet, Rfl: 3 .  gabapentin (NEURONTIN) 300 MG capsule, Take 1 capsule (300 mg total) by mouth 3 (three) times daily. (Patient taking differently: Take 300 mg by mouth 2 (two) times daily. ), Disp: 90 capsule, Rfl: 3 .  insulin glargine (LANTUS) 100 UNIT/ML injection, Inject 45-55 Units into the skin 2 (two) times daily. Take 45 units in the morning and 55 units at night, Disp: , Rfl:  .  insulin regular (HUMULIN R) 100 units/mL injection, Inject 0.02 mLs (2 Units total) into the skin 3 (three) times daily before meals., Disp: 10 mL, Rfl: 0 .  levothyroxine (SYNTHROID, LEVOTHROID) 125 MCG tablet, Take 1 tablet (125 mcg total) by mouth daily before breakfast., Disp: 30 tablet, Rfl: 1 .  metFORMIN (GLUCOPHAGE) 500 MG tablet, Take 500 mg by mouth 2 (two) times daily with a meal., Disp: ,  Rfl:  .  morphine (MS CONTIN) 100 MG 12 hr tablet, Take 100 mg by mouth every 12 (twelve) hours as needed for pain. , Disp: , Rfl:  .  MOVANTIK 25 MG TABS tablet, TAKE 1 TABLET BY MOUTH EVERY DAY, Disp: 30 tablet, Rfl: 5 .  oxymetazoline (AFRIN) 0.05 % nasal spray, Place 2 sprays into both nostrils 2 (two) times daily as needed for congestion., Disp: , Rfl:  .  RABEprazole (ACIPHEX) 20 MG tablet, Take 2 tablets (40 mg total) by mouth daily before breakfast., Disp: 30 tablet, Rfl: 1 .  tamsulosin (FLOMAX) 0.4 MG CAPS capsule, Take 0.4 mg by mouth at bedtime. , Disp: , Rfl:  .  Tiotropium Bromide-Olodaterol (STIOLTO RESPIMAT) 2.5-2.5 MCG/ACT  AERS, Inhale 2 Inhalers into the lungs daily. (Patient taking differently: Inhale 2 puffs into the lungs daily. ), Disp: 12 g, Rfl: 3 .  traZODone (DESYREL) 100 MG tablet, Take 3 tablets (300 mg total) by mouth at bedtime., Disp: 30 tablet, Rfl: 0 .  venlafaxine XR (EFFEXOR-XR) 150 MG 24 hr capsule, Take 1 capsule (150 mg total) by mouth daily with breakfast. (Patient taking differently: Take 150 mg by mouth 2 (two) times daily. ), Disp: 30 capsule, Rfl: 1 .  albuterol (PROVENTIL HFA;VENTOLIN HFA) 108 (90 Base) MCG/ACT inhaler, INHALE 2 PUFFS INTO THE LUNGS EVERY 6 HOURS AS NEEDED FOR WHEEZE OR SHORTNESS OF BREATH, Disp: 18 g, Rfl: 3   Exam: Current vital signs: BP (!) 163/95   Pulse 63   Temp 97.6 F (36.4 C) (Oral)   Resp (!) 21   Ht 5\' 10"  (1.778 m)   Wt 110 kg   SpO2 94%   BMI 34.80 kg/m  Vital signs in last 24 hours: Temp:  [97.6 F (36.4 C)] 97.6 F (36.4 C) (02/08 1225) Pulse Rate:  [52-71] 63 (02/08 1430) Resp:  [21-23] 21 (02/08 1430) BP: (147-163)/(85-95) 163/95 (02/08 1430) SpO2:  [88 %-96 %] 94 % (02/08 1430) Weight:  [110 kg] 110 kg (02/08 1226)  GENERAL: Awake, alert in NAD HEENT: - Normocephalic and atraumatic, LUNGS - Respirations unlabored at the time of bedside evaluation.  Ext: Warm and well perfused  NEURO:  Mental Status: Able to follow commands and answer simple questions. Oriented to city, state and month. States it is 59 and that the day of the week is Tuesday. Speech is fluent.  Cranial Nerves: PERRL. EOMI without nystagmus but has inattention when performing. Face symmetric. Phonation intact. No lingual dysarthria. Head at midline.  Motor: 4+/5 BUE and BLE. No asterixis noted. Subtle tremor of RUE was noted briefly.  Sensation- Intact to light touch bilaterally Coordination: No ataxia with FNF bilaterally  Gait- deferred    Labs I have reviewed labs in epic and the results pertinent to this consultation are:   CBC    Component Value  Date/Time   WBC 9.2 09/12/2018 1221   RBC 4.99 09/12/2018 1221   HGB 14.3 09/12/2018 1312   HCT 42.0 09/12/2018 1312   PLT 278 09/12/2018 1221   MCV 89.0 09/12/2018 1221   MCH 29.1 09/12/2018 1221   MCHC 32.7 09/12/2018 1221   RDW 14.4 09/12/2018 1221   LYMPHSABS 1.7 09/12/2018 1221   MONOABS 0.9 09/12/2018 1221   EOSABS 0.1 09/12/2018 1221   BASOSABS 0.0 09/12/2018 1221    CMP     Component Value Date/Time   NA 131 (L) 09/12/2018 1312   K 3.9 09/12/2018 1312   CL 90 (L) 09/12/2018 1221  CO2 28 09/12/2018 1221   GLUCOSE 110 (H) 09/12/2018 1221   BUN 12 09/12/2018 1221   CREATININE 0.78 09/12/2018 1221   CREATININE 0.88 08/27/2018 0957   CALCIUM 8.9 09/12/2018 1221   PROT 6.6 09/12/2018 1221   ALBUMIN 3.2 (L) 09/12/2018 1221   AST 21 09/12/2018 1221   ALT 19 09/12/2018 1221   ALKPHOS 105 09/12/2018 1221   BILITOT 1.2 09/12/2018 1221   GFRNONAA >60 09/12/2018 1221   GFRNONAA 88 08/27/2018 0957   GFRAA >60 09/12/2018 1221   GFRAA 102 08/27/2018 0957    Lipid Panel     Component Value Date/Time   CHOL 223 (H) 10/27/2013 0857   TRIG 216 (H) 10/27/2013 0857   HDL 42 10/27/2013 0857   CHOLHDL 5.3 10/27/2013 0857   VLDL 43 (H) 10/27/2013 0857   LDLCALC 138 (H) 10/27/2013 0857     Imaging I have reviewed the images obtained:  CT-scan of the brain from today 1. No acute intracranial abnormality. 2. Similar appearance of extensive chronic microvascular ischemic white matter disease. 3. Stable remote prior left caudate lacunar infarct.  MRI from 09/05/18: 1.  No acute intracranial abnormality. 2. Advanced predominantly small vessel chronic ischemic disease, most pronounced in the left hemisphere and with left brainstem Wallerian degeneration. There is a small area of chronic cortical encephalomalacia in the left mesial temporal lobe not directly affecting the left hippocampus. 3. Paranasal sinus inflammation and retained secretions in  the nasopharynx.  Assessment: 68 year old male with tremors, myoclonic-like jerks and increased falls.  1. His PCP had been considering starting an anticonvulsant but was holding off on this until an EEG could be obtained. DDx for jerks includes cortical myoclonus, spinal myoclonus and asterixis.  2. Exam is nonlateralizing but there is evidence for cognitive impairment, which may be a postictal phenomenon or due to a mild toxic/metabolic encephalopathy versus incipient dementia.  3. CT head reveals extensive chronic microvascular ischemic white matter disease and a stable remote prior left caudate lacunar infarct. 4. History of COPD, DM, thoracic and aortic aneurysms  Recommendations: 1. EEG in the AM 2. If EEG is positive, will start Keppra 500 mg BID following 1000 mg IV load 3. PT/OT/Speech   I have interviewed and examined the patient. I have formulated the assessment and plan. 67 year old male presenting with  Electronically signed: Dr. Kerney Elbe

## 2018-09-12 NOTE — ED Provider Notes (Signed)
[facility] EMERGENCY DEPARTMENT Provider Note   CSN: [id number] Arrival date & time: 09/12/18  1210     History   Chief Complaint Chief Complaint  Patient presents with  . Altered Mental Status    HPI Brad Taylor is a 68 y.o. male history of COPD, diabetes, thoracic and aortic aneurysms, here presenting with increasing tremors and weakness.  Patient has been having increasing tremors and falls and weakness for the last week.  Patient saw his primary care doctor about a week ago and had an MRI brain that was unremarkable.  He is been falling very often and had uncontrolled jerking movements.  Family lives with him and states that these jerking movements could be unilateral or bilateral and could be upper or lower extremities.  Most of the movements resolved within a minute or so but sometimes they last up to 20 minutes.  Seem to be awake and alert at that time.  Patient also follows up with wound clinic for dressing of the right lower extremity wound was seen yesterday and was not on antibiotics.  Patient is not currently on any seizure medicine although his doctor was considering starting Keppra empirically but wants to get the EEG first.  Of note, patient was noted to be hypoxic 88% in triage is not currently on oxygen at home.  He does have a history of COPD and recently stop smoking and does have increasing shortness of breath at home.   The history is provided by the patient.    Past Medical History:  Diagnosis Date  . AAA (abdominal aortic aneurysm) ([place]) 5/15   3.5x3.5 cm  . COPD (chronic obstructive pulmonary disease) ([place])   . Depression   . Diabetes mellitus without complication ([place])   . GERD (gastroesophageal reflux disease)   . Hyperlipidemia   . Hypothyroidism   . Post traumatic stress disorder   . Stroke ([place])   . Thyroid disease   . Ulcer     Patient Active Problem List   Diagnosis Date Noted  . Tracheal mass   . Osteomyelitis of femur  ([place]) 12/03/2017  . Hypothyroidism 12/03/2017  . Chronic ulcer of left leg ([place]) 12/03/2017  . Cellulitis of left leg 12/03/2017  . Chronic diastolic CHF (congestive heart failure) ([place]) 12/03/2017  . Uncontrolled type 2 diabetes mellitus with diabetic neuropathy, with long-term current use of insulin ([place]) 07/25/2015  . Hemiplegia and hemiparesis following unspecified cerebrovascular disease affecting unspecified side ([place]) 07/25/2015  . Cellulitis, gluteal, left 01/19/2014  . Tobacco abuse 01/19/2014  . Aphasia as late effect of stroke 11/09/2013  . CVA (cerebral infarction) 09/20/2013  . Stroke [facility]) 09/15/2013    Past Surgical History:  Procedure Laterality Date  . ABDOMINAL AORTOGRAM W/LOWER EXTREMITY N/A 08/20/2017   Procedure: ABDOMINAL AORTOGRAM W/LOWER EXTREMITY;  Surgeon: Waynetta Sandy, MD;  Location: [facility] CV LAB;  Service: Cardiovascular;  Laterality: N/A;  . CATARACT EXTRACTION    . CHOLECYSTECTOMY    . ENDOVENOUS ABLATION SAPHENOUS VEIN W/ LASER Right 03/18/2018   endovenous laser ablation R GSV by Ruta Hinds MD   . ENDOVENOUS ABLATION SAPHENOUS VEIN W/ LASER Right 04/08/2018   endovenous laser ablation R SSV by Ruta Hinds MD   . [facility]    . LUNG REMOVAL, PARTIAL     right, 2 years ago at [facility] for cancer  . PERIPHERAL VASCULAR ATHERECTOMY Right 08/20/2017   Procedure: PERIPHERAL VASCULAR ATHERECTOMY;  Surgeon: Waynetta Sandy, MD;  Location: [facility]  INVASIVE CV LAB;  Service: Cardiovascular;  Laterality: Right;  posterior tibial and tibeoperoneal trunk  . PERIPHERAL VASCULAR BALLOON ANGIOPLASTY Right 08/20/2017   Procedure: PERIPHERAL VASCULAR BALLOON ANGIOPLASTY;  Surgeon: Waynetta Sandy, MD;  Location: [facility] CV LAB;  Service: Cardiovascular;  Laterality: Right;  Anterior tibial  . VIDEO BRONCHOSCOPY Bilateral 12/30/2017   Procedure: VIDEO BRONCHOSCOPY WITHOUT FLUORO;  Surgeon: Marshell Garfinkel, MD;  Location: [facility] ENDOSCOPY;   Service: Cardiopulmonary;  Laterality: Bilateral;        Home Medications    Prior to Admission medications   Medication Sig Start Date End Date Taking? Authorizing Provider  atorvastatin (LIPITOR) 10 MG tablet Take 10 mg by mouth every other day.    Yes [provider]  busPIRone (BUSPAR) 10 MG tablet Take 0.5 tablets (5 mg total) by mouth 2 (two) times daily as needed (for anxiety). Patient taking differently: Take 10 mg by mouth 2 (two) times daily.  10/08/13  Yes Angiulli, Lavon Paganini, PA-C  clopidogrel (PLAVIX) 75 MG tablet Take 1 tablet (75 mg total) by mouth daily with breakfast. 02/01/14  Yes [name], NP  gabapentin (NEURONTIN) 300 MG capsule Take 1 capsule (300 mg total) by mouth 3 (three) times daily. Patient taking differently: Take 300 mg by mouth 2 (two) times daily.  11/07/17  Yes [name], MD  insulin glargine (LANTUS) 100 UNIT/ML injection Inject 45-55 Units into the skin 2 (two) times daily. Take 45 units in the morning and 55 units at night 10/08/13  Yes Angiulli, Lavon Paganini, PA-C  insulin regular (HUMULIN R) 100 units/mL injection Inject 0.02 mLs (2 Units total) into the skin 3 (three) times daily before meals. 08/13/18  Yes [name], MD  levothyroxine (SYNTHROID, LEVOTHROID) 125 MCG tablet Take 1 tablet (125 mcg total) by mouth daily before breakfast. 10/08/13  Yes Angiulli, Lavon Paganini, PA-C  metFORMIN (GLUCOPHAGE) 500 MG tablet Take 500 mg by mouth 2 (two) times daily with a meal.   Yes [provider]  morphine (MS CONTIN) 100 MG 12 hr tablet Take 100 mg by mouth every 12 (twelve) hours as needed for pain.    Yes [provider]  MOVANTIK 25 MG TABS tablet TAKE 1 TABLET BY MOUTH EVERY DAY 04/23/18  Yes [name], MD  oxymetazoline (AFRIN) 0.05 % nasal spray Place 2 sprays into both nostrils 2 (two) times daily as needed for congestion.   Yes [provider]  RABEprazole (ACIPHEX) 20 MG tablet Take 2 tablets (40 mg total) by  mouth daily before breakfast. 10/08/13  Yes Angiulli, Lavon Paganini, PA-C  tamsulosin (FLOMAX) 0.4 MG CAPS capsule Take 0.4 mg by mouth at bedtime.    Yes [provider]  Tiotropium Bromide-Olodaterol (STIOLTO RESPIMAT) 2.5-2.5 MCG/ACT AERS Inhale 2 Inhalers into the lungs daily. Patient taking differently: Inhale 2 puffs into the lungs daily.  07/09/18  Yes [name], MD  traZODone (DESYREL) 100 MG tablet Take 3 tablets (300 mg total) by mouth at bedtime. 10/08/13  Yes Angiulli, Lavon Paganini, PA-C  venlafaxine XR (EFFEXOR-XR) 150 MG 24 hr capsule Take 1 capsule (150 mg total) by mouth daily with breakfast. Patient taking differently: Take 150 mg by mouth 2 (two) times daily.  10/08/13  Yes Angiulli, Lavon Paganini, PA-C  albuterol (PROVENTIL HFA;VENTOLIN HFA) 108 (90 Base) MCG/ACT inhaler INHALE 2 PUFFS INTO THE LUNGS EVERY 6 HOURS AS NEEDED FOR WHEEZE OR SHORTNESS OF BREATH 12/16/17   [name], MD  insulin  NPH Human (NOVOLIN N) 100 UNIT/ML injection Inject 0.02 mLs (2 Units total) into the skin 3 (three) times daily with meals. Patient not taking: Reported on 09/12/2018 08/14/18   [name], MD  umeclidinium-vilanterol [facility] ELLIPTA) 62.5-25 MCG/INH AEPB Inhale 1 puff into the lungs daily. Patient not taking: Reported on 09/12/2018 12/16/17   Marshell Garfinkel, MD    Family History Family History  Problem Relation Age of Onset  . Emphysema Father   . Thyroid disease Sister   . Stroke Maternal Grandmother   . Thyroid disease Sister   . Thyroid disease Sister     Social History Social History   Tobacco Use  . Smoking status: Current Every Day Smoker    Packs/day: 1.00    Years: 30.00    Pack years: 30.00    Types: Cigarettes  . Smokeless tobacco: Former Systems developer    Quit date: 05/04/2013  . Tobacco comment: Less than 1/2 pk per day  Substance Use Topics  . Alcohol use: No    Comment: Former heavy drinker, been sober for 10 years  . Drug use: No     Allergies   Patient has  no known allergies.   Review of Systems Review of Systems  Respiratory: Positive for shortness of breath.   Neurological: Positive for tremors and weakness.  All other systems reviewed and are negative.    Physical Exam Updated Vital Signs BP (!) 163/95   Pulse 63   Temp 97.6 F (36.4 C) (Oral)   Resp (!) 21   Ht 5\' 10"  (1.778 m)   Wt 110 kg   SpO2 94%   BMI 34.80 kg/m   Physical Exam Vitals signs and nursing note reviewed.  Constitutional:      Comments: Unintentional tremors   HENT:     Head: Normocephalic.     Mouth/Throat:     Mouth: Mucous membranes are moist.  Eyes:     Pupils: Pupils are equal, round, and reactive to light.  Neck:     Musculoskeletal: Normal range of motion.  Cardiovascular:     Rate and Rhythm: Normal rate.     Pulses: Normal pulses.     Heart sounds: Normal heart sounds.  Pulmonary:     Comments: Slightly tachyneic, wheezing throughout, no retractions  Abdominal:     General: Abdomen is flat.  Musculoskeletal: Normal range of motion.  Skin:    General: Skin is warm.  Neurological:     Comments: A & O x 2. Occasional uncontrolled jerking movements in upper and lower extremities but patient appears awake during those times. Moving all extremities, strength 4/5 throughout   Psychiatric:        Mood and Affect: Mood normal.      ED Treatments / Results  Labs (all labs ordered are listed, but only abnormal results are displayed) Labs Reviewed  COMPREHENSIVE METABOLIC PANEL - Abnormal; Notable for the following components:      Result Value   Sodium 133 (*)    Chloride 90 (*)    Glucose, Bld 110 (*)    Albumin 3.2 (*)    All other components within normal limits  BRAIN NATRIURETIC PEPTIDE - Abnormal; Notable for the following components:   B Natriuretic Peptide 272.0 (*)    All other components within normal limits  POCT I-STAT EG7 - Abnormal; Notable for the following components:   pO2, Ven 59.0 (*)    Bicarbonate 33.5 (*)      TCO2 35 (*)  Acid-Base Excess 7.0 (*)    Sodium 131 (*)    All other components within normal limits  CBC WITH DIFFERENTIAL/PLATELET  CK  URINALYSIS, ROUTINE W REFLEX MICROSCOPIC  I-STAT TROPONIN, ED    EKG EKG Interpretation  Date/Time:  Saturday September 12 2018 12:18:35 EST Ventricular Rate:  67 PR Interval:    QRS Duration: 88 QT Interval:  410 QTC Calculation: 433 R Axis:   20 Text Interpretation:  Sinus rhythm Ventricular trigeminy Borderline prolonged PR interval Low voltage, precordial leads Abnormal R-wave progression, early transition No significant change since last tracing Confirmed by Wandra Arthurs [phone]) on 09/12/2018 1:02:02 PM   Radiology Dg Chest 2 View  Result Date: 09/12/2018 CLINICAL DATA:  Altered mental status.  Recent fall EXAM: CHEST - 2 VIEW COMPARISON:  Chest CT October 28, 2017 and chest radiograph November 03, 2017 FINDINGS: There is chronic scarring and volume loss on the right with elevation the right hemidiaphragm. There is no well-defined mass currently evident in this area of scarring and retraction. The left lung is clear. Heart size is normal. Pulmonary vascularity on the left is normal. There is distortion of pulmonary vascularity on the right due to the scarring. There is aortic atherosclerosis. No bone lesions evident. IMPRESSION: Persistent scarring on the right with volume loss and retraction in the perihilar region. Next chronic elevation of right hemidiaphragm is stable. Left lung is clear. Stable cardiac silhouette. There is aortic atherosclerosis. Electronically Signed   By: Lowella Grip III M.D.   On: 09/12/2018 13:40   Ct Head Wo Contrast  Result Date: 09/12/2018 CLINICAL DATA:  68 year old male with generalized weakness, increased falls, tremors, jerking motions EXAM: CT HEAD WITHOUT CONTRAST CT CERVICAL SPINE WITHOUT CONTRAST TECHNIQUE: Multidetector CT imaging of the head and cervical spine was performed following the standard protocol  without intravenous contrast. Multiplanar CT image reconstructions of the cervical spine were also generated. COMPARISON:  Recent prior brain MRI 09/05/2018 FINDINGS: CT HEAD FINDINGS Brain: No evidence of acute infarction, hemorrhage, hydrocephalus, extra-axial collection or mass lesion/mass effect. Confluent areas of periventricular white matter hypotension with more patchy areas of subcortical and deep white matter hypoattenuation remain nonspecific but most consistent with chronic microvascular ischemic white matter disease. Small lacunar infarct in the left caudate head. Vascular: No hyperdense vessel or unexpected calcification. Skull: Normal. Negative for fracture or focal lesion. Sinuses/Orbits: No acute finding. Other: None. CT CERVICAL SPINE FINDINGS Alignment: Normal. Skull base and vertebrae: No acute fracture. No primary bone lesion or focal pathologic process. Soft tissues and spinal canal: No prevertebral fluid or swelling. No visible canal hematoma. Disc levels: Minimal multilevel cervical spondylosis. No level specific disease. Upper chest: Negative. Other: None IMPRESSION: CT HEAD 1. No acute intracranial abnormality. 2. Similar appearance of extensive chronic microvascular ischemic white matter disease. 3. Stable remote prior left caudate lacunar infarct. CT CSPINE 1. No acute fracture or malalignment. Electronically Signed   By: Jacqulynn Cadet M.D.   On: 09/12/2018 14:28   Ct Angio Chest Pe W And/or Wo Contrast  Result Date: 09/12/2018 CLINICAL DATA:  68 year old male with a nebulous constellation of symptoms including generalized weakness, increased falls, tremors, jerking motions. EXAM: CT ANGIOGRAPHY CHEST CT ABDOMEN AND PELVIS WITH CONTRAST TECHNIQUE: Multidetector CT imaging of the chest was performed using the standard protocol during bolus administration of intravenous contrast. Multiplanar CT image reconstructions and MIPs were obtained to evaluate the vascular anatomy.  Multidetector CT imaging of the abdomen and pelvis was performed using the standard  protocol during bolus administration of intravenous contrast. CONTRAST:  115mL ISOVUE-370 IOPAMIDOL (ISOVUE-370) INJECTION 76% COMPARISON:  Most recent prior CT scan of the chest 10/28/2017; prior CT scan of the abdomen and pelvis 12/10/2013 FINDINGS: CTA CHEST FINDINGS Cardiovascular: Adequate opacification of the pulmonary arteries to the lobar level. No evidence of central filling defect to suggest acute pulmonary embolus. The main pulmonary artery is enlarged at 4.3 cm in diameter. Calcifications present throughout the aorta. Stable mild aneurysmal dilatation of the ascending thoracic aorta with a maximal diameter of 4.3 cm. Thickened and calcified aortic valve. Calcifications throughout the coronary arteries. Cardiomegaly. No pericardial effusion. Mediastinum/Nodes: Unremarkable CT appearance of the thyroid gland. No suspicious mediastinal or hilar adenopathy. No soft tissue mediastinal mass. The thoracic esophagus is unremarkable. Lungs/Pleura: Surgical changes suggest prior right upper lobectomy. Chronic atelectasis and scarring is present in the medial aspect of the right middle lobe. No definite masslike area to suggest recurrent tumor. No significant interval change compared to prior imaging from March of 2019. Slightly increased respiratory motion artifact and dependent atelectasis. Musculoskeletal: No acute fracture or aggressive appearing lytic or blastic osseous lesion. Review of the MIP images confirms the above findings. CT ABDOMEN and PELVIS FINDINGS Hepatobiliary: No focal liver abnormality is seen. Status post cholecystectomy. No biliary dilatation. Pancreas: Unremarkable. No pancreatic ductal dilatation or surrounding inflammatory changes. Spleen: Normal in size without focal abnormality. Adrenals/Urinary Tract: Normal adrenal glands. Extensive motion and streak artifact limits evaluation of the kidneys. Multiple  circumscribed low-attenuation lesions demonstrate no significant interval change dating back to 2015 consistent with simple cysts. Nonspecific perinephric stranding bilaterally. No hydronephrosis or nephrolithiasis. Stomach/Bowel: Large colonic stool burden suggests constipation. No evidence of focal bowel wall thickening or bowel obstruction. Normal appendix visualized. Vascular/Lymphatic: Extensive atherosclerotic vascular calcifications throughout the abdominal aorta. Mild aneurysmal dilatation with a maximal diameter of 3.3 cm, unchanged. Aneurysmal dilatation of the left common iliac artery to 2.6 cm and the right common iliac artery to 2.8 cm, perhaps slightly enlarged compared to 2.4 and 2.5 cm respectively in 2015. Reproductive: Prostate is unremarkable. Other: No abdominal wall hernia or abnormality. No abdominopelvic ascites. Musculoskeletal: No acute fracture or aggressive appearing lytic or blastic osseous lesion. Review of the MIP images confirms the above findings. IMPRESSION: CTA CHEST 1. No evidence of acute pulmonary embolus, pneumonia or other acute cardiopulmonary process. 2. Surgical changes of prior right upper lobectomy with residual scarring but no convincing evidence of recurrent disease. 3. Enlarged main pulmonary artery suggesting underlying pulmonary arterial hypertension. 4. Stable thoracic aortic aneurysm with a maximal diameter of 4.3 cm. Recommend annual imaging followup by CTA or MRA. This recommendation follows 2010 ACCF/AHA/AATS/ACR/ASA/SCA/SCAI/SIR/STS/SVM Guidelines for the Diagnosis and Management of Patients with Thoracic Aortic Disease. Circulation. 2010; 121: W237-S283. Aortic aneurysm NOS (ICD10-I71.9). 5. Thickened and calcified aortic valve raises concern for underlying aortic valvular stenosis. 6. Cardiomegaly with coronary artery disease. CTA ABD/PELVIS 1. No acute abnormality within the abdomen or pelvis. 2. Extensive artifact related to patient motion and streak from  arms down positioning. 3. Large colonic stool burden suggesting constipation. 4. Stable mild aneurysmal dilatation of the infrarenal abdominal aorta at 3.3 cm. Recommend followup by ultrasound in 3 years. This recommendation follows ACR consensus guidelines: White Paper of the ACR Incidental Findings Committee II on Vascular Findings. J Am Coll Radiol 2013; 10:789-794. 5. Slight interval progression (compared to 2015) of bilateral common iliac artery aneurysms measuring up to 2.6 cm on the left and 2.8 cm on the right. 6. Extensive peripheral  arterial disease in the visualized iliac and femoral arteries. Could patient's lower extremity symptoms be secondary to arterial insufficiency? Electronically Signed   By: Jacqulynn Cadet M.D.   On: 09/12/2018 14:43   Ct Cervical Spine Wo Contrast  Result Date: 09/12/2018 CLINICAL DATA:  68 year old male with generalized weakness, increased falls, tremors, jerking motions EXAM: CT HEAD WITHOUT CONTRAST CT CERVICAL SPINE WITHOUT CONTRAST TECHNIQUE: Multidetector CT imaging of the head and cervical spine was performed following the standard protocol without intravenous contrast. Multiplanar CT image reconstructions of the cervical spine were also generated. COMPARISON:  Recent prior brain MRI 09/05/2018 FINDINGS: CT HEAD FINDINGS Brain: No evidence of acute infarction, hemorrhage, hydrocephalus, extra-axial collection or mass lesion/mass effect. Confluent areas of periventricular white matter hypotension with more patchy areas of subcortical and deep white matter hypoattenuation remain nonspecific but most consistent with chronic microvascular ischemic white matter disease. Small lacunar infarct in the left caudate head. Vascular: No hyperdense vessel or unexpected calcification. Skull: Normal. Negative for fracture or focal lesion. Sinuses/Orbits: No acute finding. Other: None. CT CERVICAL SPINE FINDINGS Alignment: Normal. Skull base and vertebrae: No acute fracture. No  primary bone lesion or focal pathologic process. Soft tissues and spinal canal: No prevertebral fluid or swelling. No visible canal hematoma. Disc levels: Minimal multilevel cervical spondylosis. No level specific disease. Upper chest: Negative. Other: None IMPRESSION: CT HEAD 1. No acute intracranial abnormality. 2. Similar appearance of extensive chronic microvascular ischemic white matter disease. 3. Stable remote prior left caudate lacunar infarct. CT CSPINE 1. No acute fracture or malalignment. Electronically Signed   By: Jacqulynn Cadet M.D.   On: 09/12/2018 14:28   Ct Abdomen Pelvis W Contrast  Result Date: 09/12/2018 CLINICAL DATA:  68 year old male with a nebulous constellation of symptoms including generalized weakness, increased falls, tremors, jerking motions. EXAM: CT ANGIOGRAPHY CHEST CT ABDOMEN AND PELVIS WITH CONTRAST TECHNIQUE: Multidetector CT imaging of the chest was performed using the standard protocol during bolus administration of intravenous contrast. Multiplanar CT image reconstructions and MIPs were obtained to evaluate the vascular anatomy. Multidetector CT imaging of the abdomen and pelvis was performed using the standard protocol during bolus administration of intravenous contrast. CONTRAST:  176mL ISOVUE-370 IOPAMIDOL (ISOVUE-370) INJECTION 76% COMPARISON:  Most recent prior CT scan of the chest 10/28/2017; prior CT scan of the abdomen and pelvis 12/10/2013 FINDINGS: CTA CHEST FINDINGS Cardiovascular: Adequate opacification of the pulmonary arteries to the lobar level. No evidence of central filling defect to suggest acute pulmonary embolus. The main pulmonary artery is enlarged at 4.3 cm in diameter. Calcifications present throughout the aorta. Stable mild aneurysmal dilatation of the ascending thoracic aorta with a maximal diameter of 4.3 cm. Thickened and calcified aortic valve. Calcifications throughout the coronary arteries. Cardiomegaly. No pericardial effusion.  Mediastinum/Nodes: Unremarkable CT appearance of the thyroid gland. No suspicious mediastinal or hilar adenopathy. No soft tissue mediastinal mass. The thoracic esophagus is unremarkable. Lungs/Pleura: Surgical changes suggest prior right upper lobectomy. Chronic atelectasis and scarring is present in the medial aspect of the right middle lobe. No definite masslike area to suggest recurrent tumor. No significant interval change compared to prior imaging from March of 2019. Slightly increased respiratory motion artifact and dependent atelectasis. Musculoskeletal: No acute fracture or aggressive appearing lytic or blastic osseous lesion. Review of the MIP images confirms the above findings. CT ABDOMEN and PELVIS FINDINGS Hepatobiliary: No focal liver abnormality is seen. Status post cholecystectomy. No biliary dilatation. Pancreas: Unremarkable. No pancreatic ductal dilatation or surrounding inflammatory changes. Spleen: Normal  in size without focal abnormality. Adrenals/Urinary Tract: Normal adrenal glands. Extensive motion and streak artifact limits evaluation of the kidneys. Multiple circumscribed low-attenuation lesions demonstrate no significant interval change dating back to 2015 consistent with simple cysts. Nonspecific perinephric stranding bilaterally. No hydronephrosis or nephrolithiasis. Stomach/Bowel: Large colonic stool burden suggests constipation. No evidence of focal bowel wall thickening or bowel obstruction. Normal appendix visualized. Vascular/Lymphatic: Extensive atherosclerotic vascular calcifications throughout the abdominal aorta. Mild aneurysmal dilatation with a maximal diameter of 3.3 cm, unchanged. Aneurysmal dilatation of the left common iliac artery to 2.6 cm and the right common iliac artery to 2.8 cm, perhaps slightly enlarged compared to 2.4 and 2.5 cm respectively in 2015. Reproductive: Prostate is unremarkable. Other: No abdominal wall hernia or abnormality. No abdominopelvic ascites.  Musculoskeletal: No acute fracture or aggressive appearing lytic or blastic osseous lesion. Review of the MIP images confirms the above findings. IMPRESSION: CTA CHEST 1. No evidence of acute pulmonary embolus, pneumonia or other acute cardiopulmonary process. 2. Surgical changes of prior right upper lobectomy with residual scarring but no convincing evidence of recurrent disease. 3. Enlarged main pulmonary artery suggesting underlying pulmonary arterial hypertension. 4. Stable thoracic aortic aneurysm with a maximal diameter of 4.3 cm. Recommend annual imaging followup by CTA or MRA. This recommendation follows 2010 ACCF/AHA/AATS/ACR/ASA/SCA/SCAI/SIR/STS/SVM Guidelines for the Diagnosis and Management of Patients with Thoracic Aortic Disease. Circulation. 2010; 121: G017-C944. Aortic aneurysm NOS (ICD10-I71.9). 5. Thickened and calcified aortic valve raises concern for underlying aortic valvular stenosis. 6. Cardiomegaly with coronary artery disease. CTA ABD/PELVIS 1. No acute abnormality within the abdomen or pelvis. 2. Extensive artifact related to patient motion and streak from arms down positioning. 3. Large colonic stool burden suggesting constipation. 4. Stable mild aneurysmal dilatation of the infrarenal abdominal aorta at 3.3 cm. Recommend followup by ultrasound in 3 years. This recommendation follows ACR consensus guidelines: White Paper of the ACR Incidental Findings Committee II on Vascular Findings. J Am Coll Radiol 2013; 10:789-794. 5. Slight interval progression (compared to 2015) of bilateral common iliac artery aneurysms measuring up to 2.6 cm on the left and 2.8 cm on the right. 6. Extensive peripheral arterial disease in the visualized iliac and femoral arteries. Could patient's lower extremity symptoms be secondary to arterial insufficiency? Electronically Signed   By: Jacqulynn Cadet M.D.   On: 09/12/2018 14:43    Procedures Procedures (including critical care time)  CRITICAL  CARE Performed by: Wandra Arthurs   Total critical care time: 30 minutes  Critical care time was exclusive of separately billable procedures and treating other patients.  Critical care was necessary to treat or prevent imminent or life-threatening deterioration.  Critical care was time spent personally by me on the following activities: development of treatment plan with patient and/or surrogate as well as nursing, discussions with consultants, evaluation of patient's response to treatment, examination of patient, obtaining history from patient or surrogate, ordering and performing treatments and interventions, ordering and review of laboratory studies, ordering and review of radiographic studies, pulse oximetry and re-evaluation of patient's condition.   Medications Ordered in ED Medications  albuterol (PROVENTIL) (2.5 MG/3ML) 0.083% nebulizer solution 5 mg (has no administration in time range)  ipratropium (ATROVENT) nebulizer solution 0.5 mg (has no administration in time range)  methylPREDNISolone sodium succinate (SOLU-MEDROL) 125 mg/2 mL injection 125 mg (125 mg Intravenous Given 09/12/18 1441)  iopamidol (ISOVUE-370) 76 % injection (100 mLs  Contrast Given 09/12/18 1350)     Initial Impression / Assessment and Plan / ED Course  I have reviewed the triage vital signs and the nursing notes.  Pertinent labs & imaging results that were available during my care of the patient were reviewed by me and considered in my medical decision making (see chart for details).    Brad Taylor is a 68 y.o. male here with worsening tremors, hypoxia, falls. Consider partial seizures vs Parkinson's vs PE vs rhabdo. Will get labs, CT head, CTA chest, CK level.   3:30 PM CT showed no dissection or ruptured aneurysm. Aneurysms slightly increased in size. Given nebs, steroids. Breathing better but still require 2 L [place]. Will admit for COPD exacerbation with hypoxia. May need EEG for myoclonic jerks vs  parkinson's but that can be done outpatient.    Final Clinical Impressions(s) / ED Diagnoses   Final diagnoses:  None    ED Discharge Orders    None       Drenda Freeze, MD 09/12/18 1530

## 2018-09-12 NOTE — Plan of Care (Signed)
  Problem: Clinical Measurements: Goal: Ability to maintain clinical measurements within normal limits will improve Outcome: Progressing   Problem: Clinical Measurements: Goal: Respiratory complications will improve Outcome: Progressing   

## 2018-09-13 ENCOUNTER — Inpatient Hospital Stay (HOSPITAL_COMMUNITY): Payer: Medicare HMO

## 2018-09-13 DIAGNOSIS — R410 Disorientation, unspecified: Secondary | ICD-10-CM

## 2018-09-13 LAB — GLUCOSE, CAPILLARY
Glucose-Capillary: 109 mg/dL — ABNORMAL HIGH (ref 70–99)
Glucose-Capillary: 134 mg/dL — ABNORMAL HIGH (ref 70–99)
Glucose-Capillary: 112 mg/dL — ABNORMAL HIGH (ref 70–99)
Glucose-Capillary: 129 mg/dL — ABNORMAL HIGH (ref 70–99)

## 2018-09-13 LAB — HIV ANTIBODY (ROUTINE TESTING W REFLEX): HIV Screen 4th Generation wRfx: NONREACTIVE

## 2018-09-13 MED ORDER — CLOPIDOGREL BISULFATE 75 MG PO TABS
75.0000 mg | ORAL_TABLET | Freq: Every day | ORAL | Status: DC
  Administered 2018-09-14: 75 mg via ORAL
  Filled 2018-09-13: qty 1

## 2018-09-13 NOTE — Procedures (Signed)
  [place] A. Merlene Laughter, MD     [email or web address]           HISTORY: This is a 68 year old male who presents with multiple episodes of jerking and shaking worrisome for possible seizures.  MEDICATIONS: Scheduled Meds: . atorvastatin  10 mg Oral QODAY  . busPIRone  10 mg Oral BID  . gabapentin  300 mg Oral BID  . insulin aspart  0-5 Units Subcutaneous QHS  . insulin aspart  0-9 Units Subcutaneous TID WC  . insulin aspart  5 Units Subcutaneous TID WC  . insulin glargine  30 Units Subcutaneous BID  . ipratropium-albuterol  3 mL Nebulization TID  . levothyroxine  125 mcg Oral QAC breakfast  . morphine  60 mg Oral Q12H  . naloxegol oxalate  25 mg Oral Daily  . pantoprazole  40 mg Oral Daily  . tamsulosin  0.4 mg Oral QHS  . traZODone  300 mg Oral QHS  . venlafaxine XR  150 mg Oral BID   Continuous Infusions: PRN Meds:.acetaminophen **OR** acetaminophen, albuterol     ANALYSIS: A 16 channel recording using standard 10 20 measurements is conducted for 22 minutes.  The background is degraded at times with excessive myogenic artifact.  There is a well-formed posterior background activity of 9 Hz which attenuates with eye opening.  There is beta activity observed in frontal areas.  Awake and drowsy architecture are observed including rudimentary spindles and K complexes.  Photic stimulation and hyperventilation are not carried out.  There is no focal or lateralized slowing.  There is no epileptiform activity is observed.   IMPRESSION: This recording of the awake and drowsy states is normal.      Todrick Siedschlag A. Merlene Laughter, M.D.  Diplomate, [employer] of Psychiatry and Neurology ( Neurology).

## 2018-09-13 NOTE — Progress Notes (Signed)
OT Cancellation Note  Patient Details Name: Brad Taylor MRN: [id number] DOB: 16-Jul-1951   Cancelled Treatment:    Reason Eval/Treat Not Completed: Patient at procedure or test/ unavailable. Pt with EEG.  Will follow and initiate eval when able.   Delight Stare, OT Acute Rehabilitation Services Pager [phone] Office [place] 09/13/2018, 11:45 AM

## 2018-09-13 NOTE — Progress Notes (Signed)
PROGRESS NOTE    Brad Taylor   ELF:[id number]  DOB: 1951-05-07  DOA: 09/12/2018 PCP: [name], MD   Brief Narrative:  Brad Taylor  is a 68 y.o. male with AAA, COPD, chronic pain problems on narcotics, depression, PTSD, diabetes type 2 on insulin, hyperlipidemia, hypothyroidism, stroke with right-sided hemiparesis, right lower leg chronic wound who presents to the emergency room with recently worsening shortness of breath, lethargy, falls and unintentional movements of the arms and legs. Also noted in ED was that he was hypoxic with pulse ox of 88% with dypsnea.    Subjective: He has no complaints.   Assessment & Plan:   Principal Problem:   COPD with acute bronchitis - On bronchodilators and O2 as needed- he received 1 dose of Solumedrol in the ED but not continued- not wheezing on my exam today- wean O2 if able  Active Problems:  Myoclonic jerking - cont neuro eval- EEG ordered    Aphasia as late effect of stroke   Hemiplegia and hemiparesis following unspecified cerebrovascular disease affecting unspecified side  - cont Lipitor  DM 2 - ont Lantus and Novolog    Hypothyroidism - Synthroid    H/O: lung cancer      Leg wound, right  - cont dressing changes  Chronic pain - on MS contin, Neurontin   Time spent in minutes: 35 DVT prophylaxis: SCD Code Status: Full code Family Communication:  Disposition Plan: f/u Neuro eval Consultants:   neurology Procedures:    Antimicrobials:  Anti-infectives (From admission, onward)   None       Objective: Vitals:   09/12/18 2300 09/13/18 0409 09/13/18 0758 09/13/18 0837  BP: (!) 157/94 134/74  (!) 145/73  Pulse: 76 74 75 77  Resp: 20 16 16 16   Temp: 97.8 F (36.6 C) 98.1 F (36.7 C)    TempSrc: Oral Oral    SpO2: 98% 95% 97% 94%  Weight:      Height:       No intake or output data in the 24 hours ending 09/13/18 1316 Filed Weights   09/12/18 1226  Weight: 110 kg     Examination: General exam: Appears comfortable  HEENT: PERRLA, oral mucosa moist, no sclera icterus or thrush Respiratory system: Clear to auscultation. Respiratory effort normal. Cardiovascular system: S1 & S2 heard, RRR.   Gastrointestinal system: Abdomen soft, non-tender, nondistended. Normal bowel sounds. Central nervous system: Alert and oriented. No focal neurological deficits. Extremities: No cyanosis, clubbing or edema Skin: No rashes or ulcers   Data Reviewed: I have personally reviewed following labs and imaging studies  CBC: Recent Labs  Lab 09/12/18 1221 09/12/18 1312  WBC 9.2  --   NEUTROABS 6.5  --   HGB 14.5 14.3  HCT 44.4 42.0  MCV 89.0  --   PLT 278  --    Basic Metabolic Panel: Recent Labs  Lab 09/12/18 1221 09/12/18 1312  NA 133* 131*  K 4.3 3.9  CL 90*  --   CO2 28  --   GLUCOSE 110*  --   BUN 12  --   CREATININE 0.78  --   CALCIUM 8.9  --    GFR: Estimated Creatinine Clearance: 108.2 mL/min (by C-G formula based on SCr of 0.78 mg/dL). Liver Function Tests: Recent Labs  Lab 09/12/18 1221  AST 21  ALT 19  ALKPHOS 105  BILITOT 1.2  PROT 6.6  ALBUMIN 3.2*   No results for input(s): LIPASE, AMYLASE in the  last 168 hours. No results for input(s): AMMONIA in the last 168 hours. Coagulation Profile: No results for input(s): INR, PROTIME in the last 168 hours. Cardiac Enzymes: Recent Labs  Lab 09/12/18 1221  CKTOTAL 63   BNP (last 3 results) No results for input(s): PROBNP in the last 8760 hours. HbA1C: No results for input(s): HGBA1C in the last 72 hours. CBG: Recent Labs  Lab 09/12/18 1629 09/12/18 1736 09/12/18 2115 09/13/18 0838  GLUCAP 70 85 142* 109*   Lipid Profile: No results for input(s): CHOL, HDL, LDLCALC, TRIG, CHOLHDL, LDLDIRECT in the last 72 hours. Thyroid Function Tests: No results for input(s): TSH, T4TOTAL, FREET4, T3FREE, THYROIDAB in the last 72 hours. Anemia Panel: No results for input(s):  VITAMINB12, FOLATE, FERRITIN, TIBC, IRON, RETICCTPCT in the last 72 hours. Urine analysis:    Component Value Date/Time   COLORURINE YELLOW 09/12/2018 1532   APPEARANCEUR CLEAR 09/12/2018 1532   LABSPEC 1.036 (H) 09/12/2018 1532   PHURINE 7.0 09/12/2018 1532   GLUCOSEU NEGATIVE 09/12/2018 1532   HGBUR NEGATIVE 09/12/2018 1532   BILIRUBINUR NEGATIVE 09/12/2018 1532   KETONESUR NEGATIVE 09/12/2018 1532   PROTEINUR NEGATIVE 09/12/2018 1532   UROBILINOGEN 1.0 09/25/2013 1009   NITRITE NEGATIVE 09/12/2018 1532   LEUKOCYTESUR NEGATIVE 09/12/2018 1532   Sepsis Labs: @LABRCNTIP (procalcitonin:4,lacticidven:4) )No results found for this or any previous visit (from the past 240 hour(s)).       Radiology Studies: Dg Chest 2 View  Result Date: 09/12/2018 CLINICAL DATA:  Altered mental status.  Recent fall EXAM: CHEST - 2 VIEW COMPARISON:  Chest CT October 28, 2017 and chest radiograph November 03, 2017 FINDINGS: There is chronic scarring and volume loss on the right with elevation the right hemidiaphragm. There is no well-defined mass currently evident in this area of scarring and retraction. The left lung is clear. Heart size is normal. Pulmonary vascularity on the left is normal. There is distortion of pulmonary vascularity on the right due to the scarring. There is aortic atherosclerosis. No bone lesions evident. IMPRESSION: Persistent scarring on the right with volume loss and retraction in the perihilar region. Next chronic elevation of right hemidiaphragm is stable. Left lung is clear. Stable cardiac silhouette. There is aortic atherosclerosis. Electronically Signed   By: Lowella Grip III M.D.   On: 09/12/2018 13:40   Ct Head Wo Contrast  Result Date: 09/12/2018 CLINICAL DATA:  68 year old male with generalized weakness, increased falls, tremors, jerking motions EXAM: CT HEAD WITHOUT CONTRAST CT CERVICAL SPINE WITHOUT CONTRAST TECHNIQUE: Multidetector CT imaging of the head and cervical spine  was performed following the standard protocol without intravenous contrast. Multiplanar CT image reconstructions of the cervical spine were also generated. COMPARISON:  Recent prior brain MRI 09/05/2018 FINDINGS: CT HEAD FINDINGS Brain: No evidence of acute infarction, hemorrhage, hydrocephalus, extra-axial collection or mass lesion/mass effect. Confluent areas of periventricular white matter hypotension with more patchy areas of subcortical and deep white matter hypoattenuation remain nonspecific but most consistent with chronic microvascular ischemic white matter disease. Small lacunar infarct in the left caudate head. Vascular: No hyperdense vessel or unexpected calcification. Skull: Normal. Negative for fracture or focal lesion. Sinuses/Orbits: No acute finding. Other: None. CT CERVICAL SPINE FINDINGS Alignment: Normal. Skull base and vertebrae: No acute fracture. No primary bone lesion or focal pathologic process. Soft tissues and spinal canal: No prevertebral fluid or swelling. No visible canal hematoma. Disc levels: Minimal multilevel cervical spondylosis. No level specific disease. Upper chest: Negative. Other: None IMPRESSION: CT HEAD 1. No acute  intracranial abnormality. 2. Similar appearance of extensive chronic microvascular ischemic white matter disease. 3. Stable remote prior left caudate lacunar infarct. CT CSPINE 1. No acute fracture or malalignment. Electronically Signed   By: Jacqulynn Cadet M.D.   On: 09/12/2018 14:28   Ct Angio Chest Pe W And/or Wo Contrast  Result Date: 09/12/2018 CLINICAL DATA:  68 year old male with a nebulous constellation of symptoms including generalized weakness, increased falls, tremors, jerking motions. EXAM: CT ANGIOGRAPHY CHEST CT ABDOMEN AND PELVIS WITH CONTRAST TECHNIQUE: Multidetector CT imaging of the chest was performed using the standard protocol during bolus administration of intravenous contrast. Multiplanar CT image reconstructions and MIPs were obtained  to evaluate the vascular anatomy. Multidetector CT imaging of the abdomen and pelvis was performed using the standard protocol during bolus administration of intravenous contrast. CONTRAST:  136mL ISOVUE-370 IOPAMIDOL (ISOVUE-370) INJECTION 76% COMPARISON:  Most recent prior CT scan of the chest 10/28/2017; prior CT scan of the abdomen and pelvis 12/10/2013 FINDINGS: CTA CHEST FINDINGS Cardiovascular: Adequate opacification of the pulmonary arteries to the lobar level. No evidence of central filling defect to suggest acute pulmonary embolus. The main pulmonary artery is enlarged at 4.3 cm in diameter. Calcifications present throughout the aorta. Stable mild aneurysmal dilatation of the ascending thoracic aorta with a maximal diameter of 4.3 cm. Thickened and calcified aortic valve. Calcifications throughout the coronary arteries. Cardiomegaly. No pericardial effusion. Mediastinum/Nodes: Unremarkable CT appearance of the thyroid gland. No suspicious mediastinal or hilar adenopathy. No soft tissue mediastinal mass. The thoracic esophagus is unremarkable. Lungs/Pleura: Surgical changes suggest prior right upper lobectomy. Chronic atelectasis and scarring is present in the medial aspect of the right middle lobe. No definite masslike area to suggest recurrent tumor. No significant interval change compared to prior imaging from March of 2019. Slightly increased respiratory motion artifact and dependent atelectasis. Musculoskeletal: No acute fracture or aggressive appearing lytic or blastic osseous lesion. Review of the MIP images confirms the above findings. CT ABDOMEN and PELVIS FINDINGS Hepatobiliary: No focal liver abnormality is seen. Status post cholecystectomy. No biliary dilatation. Pancreas: Unremarkable. No pancreatic ductal dilatation or surrounding inflammatory changes. Spleen: Normal in size without focal abnormality. Adrenals/Urinary Tract: Normal adrenal glands. Extensive motion and streak artifact limits  evaluation of the kidneys. Multiple circumscribed low-attenuation lesions demonstrate no significant interval change dating back to 2015 consistent with simple cysts. Nonspecific perinephric stranding bilaterally. No hydronephrosis or nephrolithiasis. Stomach/Bowel: Large colonic stool burden suggests constipation. No evidence of focal bowel wall thickening or bowel obstruction. Normal appendix visualized. Vascular/Lymphatic: Extensive atherosclerotic vascular calcifications throughout the abdominal aorta. Mild aneurysmal dilatation with a maximal diameter of 3.3 cm, unchanged. Aneurysmal dilatation of the left common iliac artery to 2.6 cm and the right common iliac artery to 2.8 cm, perhaps slightly enlarged compared to 2.4 and 2.5 cm respectively in 2015. Reproductive: Prostate is unremarkable. Other: No abdominal wall hernia or abnormality. No abdominopelvic ascites. Musculoskeletal: No acute fracture or aggressive appearing lytic or blastic osseous lesion. Review of the MIP images confirms the above findings. IMPRESSION: CTA CHEST 1. No evidence of acute pulmonary embolus, pneumonia or other acute cardiopulmonary process. 2. Surgical changes of prior right upper lobectomy with residual scarring but no convincing evidence of recurrent disease. 3. Enlarged main pulmonary artery suggesting underlying pulmonary arterial hypertension. 4. Stable thoracic aortic aneurysm with a maximal diameter of 4.3 cm. Recommend annual imaging followup by CTA or MRA. This recommendation follows 2010 ACCF/AHA/AATS/ACR/ASA/SCA/SCAI/SIR/STS/SVM Guidelines for the Diagnosis and Management of Patients with Thoracic Aortic Disease. Circulation.  2010; 121: Y782-N562. Aortic aneurysm NOS (ICD10-I71.9). 5. Thickened and calcified aortic valve raises concern for underlying aortic valvular stenosis. 6. Cardiomegaly with coronary artery disease. CTA ABD/PELVIS 1. No acute abnormality within the abdomen or pelvis. 2. Extensive artifact related  to patient motion and streak from arms down positioning. 3. Large colonic stool burden suggesting constipation. 4. Stable mild aneurysmal dilatation of the infrarenal abdominal aorta at 3.3 cm. Recommend followup by ultrasound in 3 years. This recommendation follows ACR consensus guidelines: White Paper of the ACR Incidental Findings Committee II on Vascular Findings. J Am Coll Radiol 2013; 10:789-794. 5. Slight interval progression (compared to 2015) of bilateral common iliac artery aneurysms measuring up to 2.6 cm on the left and 2.8 cm on the right. 6. Extensive peripheral arterial disease in the visualized iliac and femoral arteries. Could patient's lower extremity symptoms be secondary to arterial insufficiency? Electronically Signed   By: Jacqulynn Cadet M.D.   On: 09/12/2018 14:43   Ct Cervical Spine Wo Contrast  Result Date: 09/12/2018 CLINICAL DATA:  68 year old male with generalized weakness, increased falls, tremors, jerking motions EXAM: CT HEAD WITHOUT CONTRAST CT CERVICAL SPINE WITHOUT CONTRAST TECHNIQUE: Multidetector CT imaging of the head and cervical spine was performed following the standard protocol without intravenous contrast. Multiplanar CT image reconstructions of the cervical spine were also generated. COMPARISON:  Recent prior brain MRI 09/05/2018 FINDINGS: CT HEAD FINDINGS Brain: No evidence of acute infarction, hemorrhage, hydrocephalus, extra-axial collection or mass lesion/mass effect. Confluent areas of periventricular white matter hypotension with more patchy areas of subcortical and deep white matter hypoattenuation remain nonspecific but most consistent with chronic microvascular ischemic white matter disease. Small lacunar infarct in the left caudate head. Vascular: No hyperdense vessel or unexpected calcification. Skull: Normal. Negative for fracture or focal lesion. Sinuses/Orbits: No acute finding. Other: None. CT CERVICAL SPINE FINDINGS Alignment: Normal. Skull base and  vertebrae: No acute fracture. No primary bone lesion or focal pathologic process. Soft tissues and spinal canal: No prevertebral fluid or swelling. No visible canal hematoma. Disc levels: Minimal multilevel cervical spondylosis. No level specific disease. Upper chest: Negative. Other: None IMPRESSION: CT HEAD 1. No acute intracranial abnormality. 2. Similar appearance of extensive chronic microvascular ischemic white matter disease. 3. Stable remote prior left caudate lacunar infarct. CT CSPINE 1. No acute fracture or malalignment. Electronically Signed   By: Jacqulynn Cadet M.D.   On: 09/12/2018 14:28   Ct Abdomen Pelvis W Contrast  Result Date: 09/12/2018 CLINICAL DATA:  68 year old male with a nebulous constellation of symptoms including generalized weakness, increased falls, tremors, jerking motions. EXAM: CT ANGIOGRAPHY CHEST CT ABDOMEN AND PELVIS WITH CONTRAST TECHNIQUE: Multidetector CT imaging of the chest was performed using the standard protocol during bolus administration of intravenous contrast. Multiplanar CT image reconstructions and MIPs were obtained to evaluate the vascular anatomy. Multidetector CT imaging of the abdomen and pelvis was performed using the standard protocol during bolus administration of intravenous contrast. CONTRAST:  132mL ISOVUE-370 IOPAMIDOL (ISOVUE-370) INJECTION 76% COMPARISON:  Most recent prior CT scan of the chest 10/28/2017; prior CT scan of the abdomen and pelvis 12/10/2013 FINDINGS: CTA CHEST FINDINGS Cardiovascular: Adequate opacification of the pulmonary arteries to the lobar level. No evidence of central filling defect to suggest acute pulmonary embolus. The main pulmonary artery is enlarged at 4.3 cm in diameter. Calcifications present throughout the aorta. Stable mild aneurysmal dilatation of the ascending thoracic aorta with a maximal diameter of 4.3 cm. Thickened and calcified aortic valve. Calcifications throughout the coronary  arteries. Cardiomegaly. No  pericardial effusion. Mediastinum/Nodes: Unremarkable CT appearance of the thyroid gland. No suspicious mediastinal or hilar adenopathy. No soft tissue mediastinal mass. The thoracic esophagus is unremarkable. Lungs/Pleura: Surgical changes suggest prior right upper lobectomy. Chronic atelectasis and scarring is present in the medial aspect of the right middle lobe. No definite masslike area to suggest recurrent tumor. No significant interval change compared to prior imaging from March of 2019. Slightly increased respiratory motion artifact and dependent atelectasis. Musculoskeletal: No acute fracture or aggressive appearing lytic or blastic osseous lesion. Review of the MIP images confirms the above findings. CT ABDOMEN and PELVIS FINDINGS Hepatobiliary: No focal liver abnormality is seen. Status post cholecystectomy. No biliary dilatation. Pancreas: Unremarkable. No pancreatic ductal dilatation or surrounding inflammatory changes. Spleen: Normal in size without focal abnormality. Adrenals/Urinary Tract: Normal adrenal glands. Extensive motion and streak artifact limits evaluation of the kidneys. Multiple circumscribed low-attenuation lesions demonstrate no significant interval change dating back to 2015 consistent with simple cysts. Nonspecific perinephric stranding bilaterally. No hydronephrosis or nephrolithiasis. Stomach/Bowel: Large colonic stool burden suggests constipation. No evidence of focal bowel wall thickening or bowel obstruction. Normal appendix visualized. Vascular/Lymphatic: Extensive atherosclerotic vascular calcifications throughout the abdominal aorta. Mild aneurysmal dilatation with a maximal diameter of 3.3 cm, unchanged. Aneurysmal dilatation of the left common iliac artery to 2.6 cm and the right common iliac artery to 2.8 cm, perhaps slightly enlarged compared to 2.4 and 2.5 cm respectively in 2015. Reproductive: Prostate is unremarkable. Other: No abdominal wall hernia or abnormality. No  abdominopelvic ascites. Musculoskeletal: No acute fracture or aggressive appearing lytic or blastic osseous lesion. Review of the MIP images confirms the above findings. IMPRESSION: CTA CHEST 1. No evidence of acute pulmonary embolus, pneumonia or other acute cardiopulmonary process. 2. Surgical changes of prior right upper lobectomy with residual scarring but no convincing evidence of recurrent disease. 3. Enlarged main pulmonary artery suggesting underlying pulmonary arterial hypertension. 4. Stable thoracic aortic aneurysm with a maximal diameter of 4.3 cm. Recommend annual imaging followup by CTA or MRA. This recommendation follows 2010 ACCF/AHA/AATS/ACR/ASA/SCA/SCAI/SIR/STS/SVM Guidelines for the Diagnosis and Management of Patients with Thoracic Aortic Disease. Circulation. 2010; 121: B762-G315. Aortic aneurysm NOS (ICD10-I71.9). 5. Thickened and calcified aortic valve raises concern for underlying aortic valvular stenosis. 6. Cardiomegaly with coronary artery disease. CTA ABD/PELVIS 1. No acute abnormality within the abdomen or pelvis. 2. Extensive artifact related to patient motion and streak from arms down positioning. 3. Large colonic stool burden suggesting constipation. 4. Stable mild aneurysmal dilatation of the infrarenal abdominal aorta at 3.3 cm. Recommend followup by ultrasound in 3 years. This recommendation follows ACR consensus guidelines: White Paper of the ACR Incidental Findings Committee II on Vascular Findings. J Am Coll Radiol 2013; 10:789-794. 5. Slight interval progression (compared to 2015) of bilateral common iliac artery aneurysms measuring up to 2.6 cm on the left and 2.8 cm on the right. 6. Extensive peripheral arterial disease in the visualized iliac and femoral arteries. Could patient's lower extremity symptoms be secondary to arterial insufficiency? Electronically Signed   By: Jacqulynn Cadet M.D.   On: 09/12/2018 14:43      Scheduled Meds: . atorvastatin  10 mg Oral QODAY   . busPIRone  10 mg Oral BID  . [START ON 09/14/2018] clopidogrel  75 mg Oral Q breakfast  . gabapentin  300 mg Oral BID  . insulin aspart  0-5 Units Subcutaneous QHS  . insulin aspart  0-9 Units Subcutaneous TID WC  . insulin aspart  5 Units  Subcutaneous TID WC  . insulin glargine  30 Units Subcutaneous BID  . ipratropium-albuterol  3 mL Nebulization TID  . levothyroxine  125 mcg Oral QAC breakfast  . morphine  60 mg Oral Q12H  . naloxegol oxalate  25 mg Oral Daily  . pantoprazole  40 mg Oral Daily  . tamsulosin  0.4 mg Oral QHS  . traZODone  300 mg Oral QHS  . venlafaxine XR  150 mg Oral BID   Continuous Infusions:   LOS: 1 day      Debbe Odea, MD [facility] Pager: [email or web address] Password [email or web address] 09/13/2018, 1:16 PM

## 2018-09-13 NOTE — Progress Notes (Signed)
Dr. Wynelle Cleveland was made aware of Pt's EKG changes including 2nd degree type 1 AVB this morning at 7am. Day nurse will follow up with Dr. Wynelle Cleveland about EKG changes.

## 2018-09-13 NOTE — Progress Notes (Signed)
EEG completed, results pending. 

## 2018-09-13 NOTE — Evaluation (Signed)
Occupational Therapy Evaluation Patient Details Name: Brad Taylor MRN: [id number] DOB: 15-Dec-1950 Today's Date: 09/13/2018    History of Present Illness Pt is a 68 y/o male presenting with recently worsening shortness of breath, lethargy, falls and unintentional movements of the arms and legs.  Reports increased falls and increased assistance needed over the past 2 weeks.  CT reveals extensive chronic microvascular ischemic white matter disease and a stable remote prior left caudate lacunar infarct, MRI from 09/05/18 negative for acute abnormality. PMH: AAA, COPD, diabetes, CVA with R sided hemiparesis, PTSD, R lower leg chronic wound.    Clinical Impression   PTA patient reports independent and driving, increased assist required over the past few weeks and needing to use RW for mobility.  Patient admitted for above and limited by impaired activity tolerance, impaired balance and overall lethargy (limiting eval). Patient oriented and following commands given increased time, min cueing to initiate tasks.  Patient requires setup for UB ADLs, min-mod assist for LB ADLs, min assist for basic transfers using RW, and min assist for bed mobility.  Patient will benefit from continued OT services while admitted and after dc at [facility] level in order to optimize independence and safety with ADLs/mobility.      Follow Up Recommendations  Home health OT;Supervision/Assistance - 24 hour    Equipment Recommendations  3 in 1 bedside commode    Recommendations for Other Services       Precautions / Restrictions Precautions Precautions: Fall Restrictions Weight Bearing Restrictions: No      Mobility Bed Mobility Overal bed mobility: Needs Assistance Bed Mobility: Supine to Sit;Sit to Supine     Supine to sit: Min assist Sit to supine: Supervision   General bed mobility comments: min assist to ascend trunk, increased time and effort required  Transfers Overall transfer level: Needs  assistance Equipment used: Rolling walker (2 wheeled) Transfers: Sit to/from Stand Sit to Stand: Min assist         General transfer comment: cueing for hand placement and safety, min assist to power up into standing     Balance Overall balance assessment: Needs assistance;History of Falls Sitting-balance support: No upper extremity supported;Feet supported Sitting balance-Leahy Scale: Fair     Standing balance support: Bilateral upper extremity supported;During functional activity Standing balance-Leahy Scale: Fair Standing balance comment: reliant on UE support                           ADL either performed or assessed with clinical judgement   ADL Overall ADL's : Needs assistance/impaired     Grooming: Set up;Sitting   Upper Body Bathing: Set up;Sitting   Lower Body Bathing: Sit to/from stand;Minimal assistance   Upper Body Dressing : Set up;Sitting   Lower Body Dressing: Moderate assistance;Sit to/from stand   Toilet Transfer: Minimal assistance;Ambulation;RW Toilet Transfer Details (indicate cue type and reason): simulated in room          Functional mobility during ADLs: Minimal assistance;Rolling walker;Cueing for safety General ADL Comments: limited eval, patient eager to take a nap; limited by decreased activity tolerance     Vision   Additional Comments: further assessment recommended     Perception     Praxis      Pertinent Vitals/Pain Pain Assessment: No/denies pain     Hand Dominance Right   Extremity/Trunk Assessment Upper Extremity Assessment Upper Extremity Assessment: RUE deficits/detail;LUE deficits/detail RUE Deficits / Details: grossly 4/5 MMT, hx of hemiparesis from prior  cva  LUE Deficits / Details: grossly 4/5, WFL    Lower Extremity Assessment Lower Extremity Assessment: Defer to PT evaluation       Communication Communication Communication: No difficulties   Cognition Arousal/Alertness: Lethargic Behavior  During Therapy: Flat affect Overall Cognitive Status: No family/caregiver present to determine baseline cognitive functioning Area of Impairment: Following commands;Safety/judgement;Problem solving                       Following Commands: Follows one step commands consistently;Follows one step commands with increased time Safety/Judgement: Decreased awareness of safety   Problem Solving: Decreased initiation;Requires verbal cues;Slow processing General Comments: pt lethargic, oriented today.  follows commands and engages approrpriately throughout sesion.  requires cueing for initation and safety    General Comments       Exercises     Shoulder Instructions      Home Living Family/patient expects to be discharged to:: Private residence Living Arrangements: Spouse/significant other Available Help at Discharge: Family;Available 24 hours/day Type of Home: House Home Access: Stairs to enter [employer] of Steps: 5 Entrance Stairs-Rails: (+ rail) Home Layout: One level     Bathroom Shower/Tub: Tub only   Biochemist, clinical: Handicapped height     Home Equipment: Environmental consultant - 2 wheels          Prior Functioning/Environment Level of Independence: Independent        Comments: reports he was independent, driving and completing med mgmt; up until about 2 weeks about he had started using RW         OT Problem List: Decreased strength;Decreased activity tolerance;Impaired balance (sitting and/or standing);Decreased safety awareness;Decreased knowledge of use of DME or AE      OT Treatment/Interventions: Self-care/ADL training;Therapeutic exercise;DME and/or AE instruction;Balance training;Patient/family education;Therapeutic activities    OT Goals(Current goals can be found in the care plan section) Acute Rehab OT Goals Patient Stated Goal: to get back home  OT Goal Formulation: With patient Time For Goal Achievement: 09/27/18 Potential to Achieve Goals:  Good  OT Frequency: Min 2X/week   Barriers to D/C:            Co-evaluation              AM-PAC OT "6 Clicks" Daily Activity     Outcome Measure Help from another person eating meals?: None Help from another person taking care of personal grooming?: A Little Help from another person toileting, which includes using toliet, bedpan, or urinal?: A Little Help from another person bathing (including washing, rinsing, drying)?: A Little Help from another person to put on and taking off regular upper body clothing?: None Help from another person to put on and taking off regular lower body clothing?: A Lot 6 Click Score: 19   End of Session Equipment Utilized During Treatment: Rolling walker Nurse Communication: Mobility status  Activity Tolerance: Patient limited by fatigue Patient left: in bed;with call bell/phone within reach;with bed alarm set;with SCD's reapplied  OT Visit Diagnosis: Other abnormalities of gait and mobility (R26.89)                Time: 4403-4742 OT Time Calculation (min): 9 min Charges:  OT General Charges $OT Visit: 1 Visit OT Evaluation $OT Eval Low Complexity: 1 Low  Delight Stare, OT Acute Rehabilitation Services Pager [phone] Office [phone]   Delight Stare 09/13/2018, 12:51 PM

## 2018-09-13 NOTE — Progress Notes (Signed)
Pt in SB as low as 48, occasional PVCs and 1.5 sec pause. VSS (in epic). Pt is asymptomatic. Hospitalist was paged and no new orders given.

## 2018-09-13 NOTE — Progress Notes (Signed)
Subjective: No complaints. No jerking movements currently.   Objective: Current vital signs: BP (!) 145/73 (BP Location: Left Arm)   Pulse 77   Temp 98.1 F (36.7 C) (Oral)   Resp 16   Ht 5\' 10"  (1.778 m)   Wt 110 kg   SpO2 94%   BMI 34.80 kg/m  Vital signs in last 24 hours: Temp:  [97.6 F (36.4 C)-98.5 F (36.9 C)] 98.1 F (36.7 C) (02/09 0409) Pulse Rate:  [52-77] 77 (02/09 0837) Resp:  [16-23] 16 (02/09 0837) BP: (134-163)/(73-95) 145/73 (02/09 0837) SpO2:  [88 %-98 %] 94 % (02/09 0837) FiO2 (%):  [28 %] 28 % (02/09 0758) Weight:  [110 kg] 110 kg (02/08 1226)  Intake/Output from previous day: No intake/output data recorded. Intake/Output this shift: No intake/output data recorded. Nutritional status:  Diet Order            Diet Carb Modified Fluid consistency: Thin; Room service appropriate? Yes  Diet effective now             HEENT: [place]/AT Lungs: Respirations unlabored Ext: Warm and well perfused  Neurologic Exam: Ment: Alert. Oriented to "1" for month "20" for year. Oriented to the day of the week, the city and state. Speech fluent. Intact comprehension.  CN:  Pupils equal. EOMI. Fixates and tracks normally. Face symmetric. Hearing intact to conversation. Phonation intact. Motor: 5/5 in all 4 extremities.  Sensory: Intact to FT Cerebellar: No ataxia with FNF bilaterally Other: No tremor, jerking, twitching, asterixis, myoclonus or other adventitious movements noted.   Lab Results: Results for orders placed or performed during the hospital encounter of 09/12/18 (from the past 48 hour(s))  CBC with Differential/Platelet     Status: None   Collection Time: 09/12/18 12:21 PM  Result Value Ref Range   WBC 9.2 4.0 - 10.5 K/uL   RBC 4.99 4.22 - 5.81 MIL/uL   Hemoglobin 14.5 13.0 - 17.0 g/dL   HCT 44.4 39.0 - 52.0 %   MCV 89.0 80.0 - 100.0 fL   MCH 29.1 26.0 - 34.0 pg   MCHC 32.7 30.0 - 36.0 g/dL   RDW 14.4 11.5 - 15.5 %   Platelets 278 150 - 400 K/uL    nRBC 0.0 0.0 - 0.2 %   Neutrophils Relative % 69 %   Neutro Abs 6.5 1.7 - 7.7 K/uL   Lymphocytes Relative 19 %   Lymphs Abs 1.7 0.7 - 4.0 K/uL   Monocytes Relative 10 %   Monocytes Absolute 0.9 0.1 - 1.0 K/uL   Eosinophils Relative 1 %   Eosinophils Absolute 0.1 0.0 - 0.5 K/uL   Basophils Relative 0 %   Basophils Absolute 0.0 0.0 - 0.1 K/uL   Immature Granulocytes 1 %   Abs Immature Granulocytes 0.05 0.00 - 0.07 K/uL    Comment: Performed at [facility], [address] [place], [place] [address]  Comprehensive metabolic panel     Status: Abnormal   Collection Time: 09/12/18 12:21 PM  Result Value Ref Range   Sodium 133 (L) 135 - 145 mmol/L   Potassium 4.3 3.5 - 5.1 mmol/L   Chloride 90 (L) 98 - 111 mmol/L   CO2 28 22 - 32 mmol/L   Glucose, Bld 110 (H) 70 - 99 mg/dL   BUN 12 8 - 23 mg/dL   Creatinine, Ser 0.78 0.61 - 1.24 mg/dL   Calcium 8.9 8.9 - 10.3 mg/dL   Total Protein 6.6 6.5 - 8.1 g/dL  Albumin 3.2 (L) 3.5 - 5.0 g/dL   AST 21 15 - 41 U/L   ALT 19 0 - 44 U/L   Alkaline Phosphatase 105 38 - 126 U/L   Total Bilirubin 1.2 0.3 - 1.2 mg/dL   GFR calc non Af Amer >60 >60 mL/min   GFR calc Af Amer >60 >60 mL/min   Anion gap 15 5 - 15    Comment: Performed at [facility] [address] [place], [place] [address]  CK     Status: None   Collection Time: 09/12/18 12:21 PM  Result Value Ref Range   Total CK 63 49 - 397 U/L    Comment: Performed at [facility], [place] [address] [place], [place] [address]  Brain natriuretic peptide     Status: Abnormal   Collection Time: 09/12/18 12:21 PM  Result Value Ref Range   B Natriuretic Peptide 272.0 (H) 0.0 - 100.0 pg/mL    Comment: Performed at [facility] [address], [place], [place] [address]  I-stat troponin, ED     Status: None   Collection Time: 09/12/18 12:40 PM  Result Value Ref Range   Troponin i, poc 0.01 0.00 - 0.08 ng/mL   Comment 3            Comment: Due to the release kinetics of  cTnI, a negative result within the first hours of the onset of symptoms does not rule out myocardial infarction with certainty. If myocardial infarction is still suspected, repeat the test at appropriate intervals.   POCT I-Stat EG7     Status: Abnormal   Collection Time: 09/12/18  1:12 PM  Result Value Ref Range   pH, Ven 7.399 7.250 - 7.430   pCO2, Ven 54.3 44.0 - 60.0 mmHg   pO2, Ven 59.0 (H) 32.0 - 45.0 mmHg   Bicarbonate 33.5 (H) 20.0 - 28.0 mmol/L   TCO2 35 (H) 22 - 32 mmol/L   O2 Saturation 89.0 %   Acid-Base Excess 7.0 (H) 0.0 - 2.0 mmol/L   Sodium 131 (L) 135 - 145 mmol/L   Potassium 3.9 3.5 - 5.1 mmol/L   Calcium, Ion 1.16 1.15 - 1.40 mmol/L   HCT 42.0 39.0 - 52.0 %   Hemoglobin 14.3 13.0 - 17.0 g/dL   Patient temperature HIDE    Sample type VENOUS   Urinalysis, Routine w reflex microscopic     Status: Abnormal   Collection Time: 09/12/18  3:32 PM  Result Value Ref Range   Color, Urine YELLOW YELLOW   APPearance CLEAR CLEAR   Specific Gravity, Urine 1.036 (H) 1.005 - 1.030   pH 7.0 5.0 - 8.0   Glucose, UA NEGATIVE NEGATIVE mg/dL   Hgb urine dipstick NEGATIVE NEGATIVE   Bilirubin Urine NEGATIVE NEGATIVE   Ketones, ur NEGATIVE NEGATIVE mg/dL   Protein, ur NEGATIVE NEGATIVE mg/dL   Nitrite NEGATIVE NEGATIVE   Leukocytes, UA NEGATIVE NEGATIVE    Comment: Performed at [facility], [place] [address], [place]  [address]  CBG monitoring, ED     Status: None   Collection Time: 09/12/18  4:29 PM  Result Value Ref Range   Glucose-Capillary 70 70 - 99 mg/dL  Glucose, capillary     Status: None   Collection Time: 09/12/18  5:36 PM  Result Value Ref Range   Glucose-Capillary 85 70 - 99 mg/dL  HIV antibody (Routine Testing)     Status: None   Collection Time: 09/12/18  6:02 PM  Result Value Ref Range   HIV Screen 4th Generation wRfx Non Reactive Non Reactive    Comment: (NOTE) Performed At: [facility] [place], [place]  [id number] Rush Farmer MD GD:[id number]   Glucose, capillary     Status: Abnormal   Collection Time: 09/12/18  9:15 PM  Result Value Ref Range   Glucose-Capillary 142 (H) 70 - 99 mg/dL  Glucose, capillary     Status: Abnormal   Collection Time: 09/13/18  8:38 AM  Result Value Ref Range   Glucose-Capillary 109 (H) 70 - 99 mg/dL    No results found for this or any previous visit (from the past 240 hour(s)).  Lipid Panel No results for input(s): CHOL, TRIG, HDL, CHOLHDL, VLDL, LDLCALC in the last 72 hours.  Studies/Results: Dg Chest 2 View  Result Date: 09/12/2018 CLINICAL DATA:  Altered mental status.  Recent fall EXAM: CHEST - 2 VIEW COMPARISON:  Chest CT October 28, 2017 and chest radiograph November 03, 2017 FINDINGS: There is chronic scarring and volume loss on the right with elevation the right hemidiaphragm. There is no well-defined mass currently evident in this area of scarring and retraction. The left lung is clear. Heart size is normal. Pulmonary vascularity on the left is normal. There is distortion of pulmonary vascularity on the right due to the scarring. There is aortic atherosclerosis. No bone lesions evident. IMPRESSION: Persistent scarring on the right with volume loss and retraction in the perihilar region. Next chronic elevation of right hemidiaphragm is stable. Left lung is clear. Stable cardiac silhouette. There is aortic atherosclerosis. Electronically Signed   By: Lowella Grip III M.D.   On: 09/12/2018 13:40   Ct Head Wo Contrast  Result Date: 09/12/2018 CLINICAL DATA:  68 year old male with generalized weakness, increased falls, tremors, jerking motions EXAM: CT HEAD WITHOUT CONTRAST CT CERVICAL SPINE WITHOUT CONTRAST TECHNIQUE: Multidetector CT imaging of the head and cervical spine was performed following the standard protocol without intravenous contrast. Multiplanar CT image reconstructions of the cervical spine were also generated. COMPARISON:  Recent prior brain  MRI 09/05/2018 FINDINGS: CT HEAD FINDINGS Brain: No evidence of acute infarction, hemorrhage, hydrocephalus, extra-axial collection or mass lesion/mass effect. Confluent areas of periventricular white matter hypotension with more patchy areas of subcortical and deep white matter hypoattenuation remain nonspecific but most consistent with chronic microvascular ischemic white matter disease. Small lacunar infarct in the left caudate head. Vascular: No hyperdense vessel or unexpected calcification. Skull: Normal. Negative for fracture or focal lesion. Sinuses/Orbits: No acute finding. Other: None. CT CERVICAL SPINE FINDINGS Alignment: Normal. Skull base and vertebrae: No acute fracture. No primary bone lesion or focal pathologic process. Soft tissues and spinal canal: No prevertebral fluid or swelling. No visible canal hematoma. Disc levels: Minimal multilevel cervical spondylosis. No level specific disease. Upper chest: Negative. Other: None IMPRESSION: CT HEAD 1. No acute intracranial abnormality. 2. Similar appearance of extensive chronic microvascular ischemic white matter disease. 3. Stable remote prior left caudate lacunar infarct. CT CSPINE 1. No acute fracture or malalignment. Electronically Signed   By: Jacqulynn Cadet M.D.   On: 09/12/2018 14:28   Ct Angio Chest Pe W And/or Wo Contrast  Result Date: 09/12/2018 CLINICAL DATA:  68 year old male with a nebulous constellation of symptoms including generalized weakness, increased falls, tremors, jerking motions. EXAM: CT ANGIOGRAPHY CHEST CT ABDOMEN AND PELVIS WITH CONTRAST TECHNIQUE: Multidetector CT imaging of the chest was performed using the standard protocol during bolus administration of intravenous contrast. Multiplanar  CT image reconstructions and MIPs were obtained to evaluate the vascular anatomy. Multidetector CT imaging of the abdomen and pelvis was performed using the standard protocol during bolus administration of intravenous contrast.  CONTRAST:  182mL ISOVUE-370 IOPAMIDOL (ISOVUE-370) INJECTION 76% COMPARISON:  Most recent prior CT scan of the chest 10/28/2017; prior CT scan of the abdomen and pelvis 12/10/2013 FINDINGS: CTA CHEST FINDINGS Cardiovascular: Adequate opacification of the pulmonary arteries to the lobar level. No evidence of central filling defect to suggest acute pulmonary embolus. The main pulmonary artery is enlarged at 4.3 cm in diameter. Calcifications present throughout the aorta. Stable mild aneurysmal dilatation of the ascending thoracic aorta with a maximal diameter of 4.3 cm. Thickened and calcified aortic valve. Calcifications throughout the coronary arteries. Cardiomegaly. No pericardial effusion. Mediastinum/Nodes: Unremarkable CT appearance of the thyroid gland. No suspicious mediastinal or hilar adenopathy. No soft tissue mediastinal mass. The thoracic esophagus is unremarkable. Lungs/Pleura: Surgical changes suggest prior right upper lobectomy. Chronic atelectasis and scarring is present in the medial aspect of the right middle lobe. No definite masslike area to suggest recurrent tumor. No significant interval change compared to prior imaging from March of 2019. Slightly increased respiratory motion artifact and dependent atelectasis. Musculoskeletal: No acute fracture or aggressive appearing lytic or blastic osseous lesion. Review of the MIP images confirms the above findings. CT ABDOMEN and PELVIS FINDINGS Hepatobiliary: No focal liver abnormality is seen. Status post cholecystectomy. No biliary dilatation. Pancreas: Unremarkable. No pancreatic ductal dilatation or surrounding inflammatory changes. Spleen: Normal in size without focal abnormality. Adrenals/Urinary Tract: Normal adrenal glands. Extensive motion and streak artifact limits evaluation of the kidneys. Multiple circumscribed low-attenuation lesions demonstrate no significant interval change dating back to 2015 consistent with simple cysts. Nonspecific  perinephric stranding bilaterally. No hydronephrosis or nephrolithiasis. Stomach/Bowel: Large colonic stool burden suggests constipation. No evidence of focal bowel wall thickening or bowel obstruction. Normal appendix visualized. Vascular/Lymphatic: Extensive atherosclerotic vascular calcifications throughout the abdominal aorta. Mild aneurysmal dilatation with a maximal diameter of 3.3 cm, unchanged. Aneurysmal dilatation of the left common iliac artery to 2.6 cm and the right common iliac artery to 2.8 cm, perhaps slightly enlarged compared to 2.4 and 2.5 cm respectively in 2015. Reproductive: Prostate is unremarkable. Other: No abdominal wall hernia or abnormality. No abdominopelvic ascites. Musculoskeletal: No acute fracture or aggressive appearing lytic or blastic osseous lesion. Review of the MIP images confirms the above findings. IMPRESSION: CTA CHEST 1. No evidence of acute pulmonary embolus, pneumonia or other acute cardiopulmonary process. 2. Surgical changes of prior right upper lobectomy with residual scarring but no convincing evidence of recurrent disease. 3. Enlarged main pulmonary artery suggesting underlying pulmonary arterial hypertension. 4. Stable thoracic aortic aneurysm with a maximal diameter of 4.3 cm. Recommend annual imaging followup by CTA or MRA. This recommendation follows 2010 ACCF/AHA/AATS/ACR/ASA/SCA/SCAI/SIR/STS/SVM Guidelines for the Diagnosis and Management of Patients with Thoracic Aortic Disease. Circulation. 2010; 121: M355-H741. Aortic aneurysm NOS (ICD10-I71.9). 5. Thickened and calcified aortic valve raises concern for underlying aortic valvular stenosis. 6. Cardiomegaly with coronary artery disease. CTA ABD/PELVIS 1. No acute abnormality within the abdomen or pelvis. 2. Extensive artifact related to patient motion and streak from arms down positioning. 3. Large colonic stool burden suggesting constipation. 4. Stable mild aneurysmal dilatation of the infrarenal abdominal  aorta at 3.3 cm. Recommend followup by ultrasound in 3 years. This recommendation follows ACR consensus guidelines: White Paper of the ACR Incidental Findings Committee II on Vascular Findings. J Am Coll Radiol 2013; 10:789-794. 5. Slight interval progression (  compared to 2015) of bilateral common iliac artery aneurysms measuring up to 2.6 cm on the left and 2.8 cm on the right. 6. Extensive peripheral arterial disease in the visualized iliac and femoral arteries. Could patient's lower extremity symptoms be secondary to arterial insufficiency? Electronically Signed   By: Jacqulynn Cadet M.D.   On: 09/12/2018 14:43   Ct Cervical Spine Wo Contrast  Result Date: 09/12/2018 CLINICAL DATA:  68 year old male with generalized weakness, increased falls, tremors, jerking motions EXAM: CT HEAD WITHOUT CONTRAST CT CERVICAL SPINE WITHOUT CONTRAST TECHNIQUE: Multidetector CT imaging of the head and cervical spine was performed following the standard protocol without intravenous contrast. Multiplanar CT image reconstructions of the cervical spine were also generated. COMPARISON:  Recent prior brain MRI 09/05/2018 FINDINGS: CT HEAD FINDINGS Brain: No evidence of acute infarction, hemorrhage, hydrocephalus, extra-axial collection or mass lesion/mass effect. Confluent areas of periventricular white matter hypotension with more patchy areas of subcortical and deep white matter hypoattenuation remain nonspecific but most consistent with chronic microvascular ischemic white matter disease. Small lacunar infarct in the left caudate head. Vascular: No hyperdense vessel or unexpected calcification. Skull: Normal. Negative for fracture or focal lesion. Sinuses/Orbits: No acute finding. Other: None. CT CERVICAL SPINE FINDINGS Alignment: Normal. Skull base and vertebrae: No acute fracture. No primary bone lesion or focal pathologic process. Soft tissues and spinal canal: No prevertebral fluid or swelling. No visible canal hematoma. Disc  levels: Minimal multilevel cervical spondylosis. No level specific disease. Upper chest: Negative. Other: None IMPRESSION: CT HEAD 1. No acute intracranial abnormality. 2. Similar appearance of extensive chronic microvascular ischemic white matter disease. 3. Stable remote prior left caudate lacunar infarct. CT CSPINE 1. No acute fracture or malalignment. Electronically Signed   By: Jacqulynn Cadet M.D.   On: 09/12/2018 14:28   Ct Abdomen Pelvis W Contrast  Result Date: 09/12/2018 CLINICAL DATA:  68 year old male with a nebulous constellation of symptoms including generalized weakness, increased falls, tremors, jerking motions. EXAM: CT ANGIOGRAPHY CHEST CT ABDOMEN AND PELVIS WITH CONTRAST TECHNIQUE: Multidetector CT imaging of the chest was performed using the standard protocol during bolus administration of intravenous contrast. Multiplanar CT image reconstructions and MIPs were obtained to evaluate the vascular anatomy. Multidetector CT imaging of the abdomen and pelvis was performed using the standard protocol during bolus administration of intravenous contrast. CONTRAST:  11mL ISOVUE-370 IOPAMIDOL (ISOVUE-370) INJECTION 76% COMPARISON:  Most recent prior CT scan of the chest 10/28/2017; prior CT scan of the abdomen and pelvis 12/10/2013 FINDINGS: CTA CHEST FINDINGS Cardiovascular: Adequate opacification of the pulmonary arteries to the lobar level. No evidence of central filling defect to suggest acute pulmonary embolus. The main pulmonary artery is enlarged at 4.3 cm in diameter. Calcifications present throughout the aorta. Stable mild aneurysmal dilatation of the ascending thoracic aorta with a maximal diameter of 4.3 cm. Thickened and calcified aortic valve. Calcifications throughout the coronary arteries. Cardiomegaly. No pericardial effusion. Mediastinum/Nodes: Unremarkable CT appearance of the thyroid gland. No suspicious mediastinal or hilar adenopathy. No soft tissue mediastinal mass. The thoracic  esophagus is unremarkable. Lungs/Pleura: Surgical changes suggest prior right upper lobectomy. Chronic atelectasis and scarring is present in the medial aspect of the right middle lobe. No definite masslike area to suggest recurrent tumor. No significant interval change compared to prior imaging from March of 2019. Slightly increased respiratory motion artifact and dependent atelectasis. Musculoskeletal: No acute fracture or aggressive appearing lytic or blastic osseous lesion. Review of the MIP images confirms the above findings. CT ABDOMEN and PELVIS  FINDINGS Hepatobiliary: No focal liver abnormality is seen. Status post cholecystectomy. No biliary dilatation. Pancreas: Unremarkable. No pancreatic ductal dilatation or surrounding inflammatory changes. Spleen: Normal in size without focal abnormality. Adrenals/Urinary Tract: Normal adrenal glands. Extensive motion and streak artifact limits evaluation of the kidneys. Multiple circumscribed low-attenuation lesions demonstrate no significant interval change dating back to 2015 consistent with simple cysts. Nonspecific perinephric stranding bilaterally. No hydronephrosis or nephrolithiasis. Stomach/Bowel: Large colonic stool burden suggests constipation. No evidence of focal bowel wall thickening or bowel obstruction. Normal appendix visualized. Vascular/Lymphatic: Extensive atherosclerotic vascular calcifications throughout the abdominal aorta. Mild aneurysmal dilatation with a maximal diameter of 3.3 cm, unchanged. Aneurysmal dilatation of the left common iliac artery to 2.6 cm and the right common iliac artery to 2.8 cm, perhaps slightly enlarged compared to 2.4 and 2.5 cm respectively in 2015. Reproductive: Prostate is unremarkable. Other: No abdominal wall hernia or abnormality. No abdominopelvic ascites. Musculoskeletal: No acute fracture or aggressive appearing lytic or blastic osseous lesion. Review of the MIP images confirms the above findings. IMPRESSION:  CTA CHEST 1. No evidence of acute pulmonary embolus, pneumonia or other acute cardiopulmonary process. 2. Surgical changes of prior right upper lobectomy with residual scarring but no convincing evidence of recurrent disease. 3. Enlarged main pulmonary artery suggesting underlying pulmonary arterial hypertension. 4. Stable thoracic aortic aneurysm with a maximal diameter of 4.3 cm. Recommend annual imaging followup by CTA or MRA. This recommendation follows 2010 ACCF/AHA/AATS/ACR/ASA/SCA/SCAI/SIR/STS/SVM Guidelines for the Diagnosis and Management of Patients with Thoracic Aortic Disease. Circulation. 2010; 121: P295-J884. Aortic aneurysm NOS (ICD10-I71.9). 5. Thickened and calcified aortic valve raises concern for underlying aortic valvular stenosis. 6. Cardiomegaly with coronary artery disease. CTA ABD/PELVIS 1. No acute abnormality within the abdomen or pelvis. 2. Extensive artifact related to patient motion and streak from arms down positioning. 3. Large colonic stool burden suggesting constipation. 4. Stable mild aneurysmal dilatation of the infrarenal abdominal aorta at 3.3 cm. Recommend followup by ultrasound in 3 years. This recommendation follows ACR consensus guidelines: White Paper of the ACR Incidental Findings Committee II on Vascular Findings. J Am Coll Radiol 2013; 10:789-794. 5. Slight interval progression (compared to 2015) of bilateral common iliac artery aneurysms measuring up to 2.6 cm on the left and 2.8 cm on the right. 6. Extensive peripheral arterial disease in the visualized iliac and femoral arteries. Could patient's lower extremity symptoms be secondary to arterial insufficiency? Electronically Signed   By: Jacqulynn Cadet M.D.   On: 09/12/2018 14:43    Medications:  Scheduled: . atorvastatin  10 mg Oral QODAY  . busPIRone  10 mg Oral BID  . gabapentin  300 mg Oral BID  . insulin aspart  0-5 Units Subcutaneous QHS  . insulin aspart  0-9 Units Subcutaneous TID WC  . insulin  aspart  5 Units Subcutaneous TID WC  . insulin glargine  30 Units Subcutaneous BID  . ipratropium-albuterol  3 mL Nebulization TID  . levothyroxine  125 mcg Oral QAC breakfast  . morphine  60 mg Oral Q12H  . naloxegol oxalate  25 mg Oral Daily  . pantoprazole  40 mg Oral Daily  . tamsulosin  0.4 mg Oral QHS  . traZODone  300 mg Oral QHS  . venlafaxine XR  150 mg Oral BID    CT-scan of the brain from yesterday 1. No acute intracranial abnormality. 2. Similar appearance of extensive chronic microvascular ischemic white matter disease. 3. Stable remote prior left caudate lacunar infarct.  MRI from 09/05/18: 1. No acute  intracranial abnormality. 2. Advanced predominantly small vessel chronic ischemic disease, most pronounced in the left hemisphere and with left brainstem Wallerian degeneration. There is a small area of chronic cortical encephalomalacia in the left mesial temporal lobe not directly affecting the left hippocampus. 3. Paranasal sinus inflammation and retained secretions in the nasopharynx.  Assessment: 68 year old male with tremors, myoclonic-like jerks and increased falls.  1. His PCP had been considering starting an anticonvulsant but was holding off on this until an EEG could be obtained. DDx for jerks includes cortical myoclonus, spinal myoclonus and asterixis.  2. Exam is nonlateralizing but there is evidence for cognitive impairment, which may be a postictal phenomenon or due to a mild toxic/metabolic encephalopathy versus incipient dementia. No tremor, jerking, twitching, asterixis, myoclonus or other adventitious movements noted on exam today.  3. CT head reveals extensive chronic microvascular ischemic white matter disease and a stable remote prior left caudate lacunar infarct. 4. History of COPD, DM, thoracic and aortic aneurysms  Recommendations: 1. EEG pending 2. If EEG is positive, will start Keppra 500 mg BID following 1000 mg IV load 3. PT/OT/Speech   LOS: 1 day   @Electronically  signed: Dr. Kerney Elbe 09/13/2018  10:06 AM

## 2018-09-13 NOTE — Evaluation (Signed)
Physical Therapy Evaluation Patient Details Name: Brad Taylor MRN: [id number] DOB: [birth year] Today's Date: 09/13/2018   History of Present Illness  Pt is a 68 y/o male presenting with recently worsening shortness of breath, lethargy, falls and unintentional movements of the arms and legs.  Reports increased falls and increased assistance needed over the past 2 weeks.  CT reveals extensive chronic microvascular ischemic white matter disease and a stable remote prior left caudate lacunar infarct, MRI from 09/05/18 negative for acute abnormality. PMH: AAA, COPD, diabetes, CVA with R sided hemiparesis, PTSD, R lower leg chronic wound.     Clinical Impression  Pt admitted with above diagnosis. Pt currently with functional limitations due to the deficits listed below (see PT Problem List). PTA, pt living at home with wife, independent with mobility no home O2, reports 1 fall. Over last 6 days more confused per wife, today AOx4 with flat affect. Today ambulating hallway without AD, mild unsteadiness noted.  Pt will benefit from skilled PT to increase their independence and safety with mobility to allow discharge to the venue listed below.       Follow Up Recommendations Home health PT;Supervision for mobility/OOB    Equipment Recommendations  None recommended by PT    Recommendations for Other Services       Precautions / Restrictions Precautions Precautions: Fall Restrictions Weight Bearing Restrictions: No      Mobility  Bed Mobility Overal bed mobility: Needs Assistance Bed Mobility: Supine to Sit;Sit to Supine     Supine to sit: Min assist Sit to supine: Supervision   General bed mobility comments: min assist to ascend trunk, increased time and effort required  Transfers Overall transfer level: Needs assistance Equipment used: Rolling walker (2 wheeled) Transfers: Sit to/from Stand Sit to Stand: Min assist         General transfer comment: cueing for hand placement and  safety, min assist to power up into standing   Ambulation/Gait Ambulation/Gait assistance: Min guard;Supervision Gait Distance (Feet): 75 Feet Assistive device: None Gait Pattern/deviations: Step-to pattern Gait velocity: decreased   General Gait Details: patient with short step length, mild unsteady gait, ambulating hallway without AD   Stairs            Wheelchair Mobility    Modified Rankin (Stroke Patients Only)       Balance Overall balance assessment: Needs assistance;History of Falls Sitting-balance support: No upper extremity supported;Feet supported Sitting balance-Leahy Scale: Fair     Standing balance support: Bilateral upper extremity supported;During functional activity Standing balance-Leahy Scale: Fair Standing balance comment: reliant on UE support                             Pertinent Vitals/Pain Pain Assessment: No/denies pain    Home Living Family/patient expects to be discharged to:: Private residence Living Arrangements: Spouse/significant other Available Help at Discharge: Family;Available 24 hours/day Type of Home: House Home Access: Stairs to enter Entrance Stairs-Rails: (+ rail) Technical brewer of Steps: 5 Home Layout: One level Home Equipment: Walker - 2 wheels      Prior Function Level of Independence: Independent         Comments: reports he was independent, driving and completing med mgmt; up until about 2 weeks about he had started using RW      Hand Dominance   Dominant Hand: Right    Extremity/Trunk Assessment   Upper Extremity Assessment Upper Extremity Assessment: Defer to OT evaluation RUE  Deficits / Details: grossly 4/5 MMT, hx of hemiparesis from prior cva  LUE Deficits / Details: grossly 4/5, WFL     Lower Extremity Assessment Lower Extremity Assessment: Overall WFL for tasks assessed       Communication   Communication: No difficulties  Cognition Arousal/Alertness:  Lethargic Behavior During Therapy: Flat affect Overall Cognitive Status: No family/caregiver present to determine baseline cognitive functioning Area of Impairment: Following commands;Safety/judgement;Problem solving                       Following Commands: Follows one step commands consistently;Follows one step commands with increased time Safety/Judgement: Decreased awareness of safety   Problem Solving: Decreased initiation;Requires verbal cues;Slow processing General Comments: pt with flat affect AOx4 family reports some confusion over last 5 days      General Comments      Exercises     Assessment/Plan    PT Assessment Patient needs continued PT services  PT Problem List Decreased strength       PT Treatment Interventions      PT Goals (Current goals can be found in the Care Plan section)  Acute Rehab PT Goals Patient Stated Goal: to get back home  PT Goal Formulation: With patient Time For Goal Achievement: 09/27/18 Potential to Achieve Goals: Good    Frequency Min 3X/week   Barriers to discharge        Co-evaluation               AM-PAC PT "6 Clicks" Mobility  Outcome Measure Help needed turning from your back to your side while in a flat bed without using bedrails?: None Help needed moving from lying on your back to sitting on the side of a flat bed without using bedrails?: None Help needed moving to and from a bed to a chair (including a wheelchair)?: A Little Help needed standing up from a chair using your arms (e.g., wheelchair or bedside chair)?: A Little Help needed to walk in hospital room?: A Little Help needed climbing 3-5 steps with a railing? : A Little 6 Click Score: 20    End of Session Equipment Utilized During Treatment: Gait belt Activity Tolerance: Patient tolerated treatment well Patient left: in bed;with call bell/phone within reach;with nursing/sitter in room;with family/visitor present Nurse Communication: Mobility  status PT Visit Diagnosis: Unsteadiness on feet (R26.81)    Time: 1240-1305 PT Time Calculation (min) (ACUTE ONLY): 25 min   Charges:   PT Evaluation $PT Eval Low Complexity: 1 Low PT Treatments $Gait Training: 8-22 mins        Reinaldo Berber, PT, DPT Acute Rehabilitation Services Pager: [phone] Office: [place] 09/13/2018, 1:28 PM

## 2018-09-14 DIAGNOSIS — E039 Hypothyroidism, unspecified: Secondary | ICD-10-CM

## 2018-09-14 DIAGNOSIS — J441 Chronic obstructive pulmonary disease with (acute) exacerbation: Principal | ICD-10-CM

## 2018-09-14 DIAGNOSIS — I69959 Hemiplegia and hemiparesis following unspecified cerebrovascular disease affecting unspecified side: Secondary | ICD-10-CM

## 2018-09-14 DIAGNOSIS — I69320 Aphasia following cerebral infarction: Secondary | ICD-10-CM

## 2018-09-14 DIAGNOSIS — Z85118 Personal history of other malignant neoplasm of bronchus and lung: Secondary | ICD-10-CM

## 2018-09-14 DIAGNOSIS — Z794 Long term (current) use of insulin: Secondary | ICD-10-CM

## 2018-09-14 DIAGNOSIS — E1165 Type 2 diabetes mellitus with hyperglycemia: Secondary | ICD-10-CM

## 2018-09-14 DIAGNOSIS — E1140 Type 2 diabetes mellitus with diabetic neuropathy, unspecified: Secondary | ICD-10-CM

## 2018-09-14 DIAGNOSIS — I5032 Chronic diastolic (congestive) heart failure: Secondary | ICD-10-CM

## 2018-09-14 LAB — GLUCOSE, CAPILLARY
Glucose-Capillary: 82 mg/dL (ref 70–99)
Glucose-Capillary: 140 mg/dL — ABNORMAL HIGH (ref 70–99)

## 2018-09-14 MED ORDER — IPRATROPIUM-ALBUTEROL 0.5-2.5 (3) MG/3ML IN SOLN: 3.0000 mL | Freq: Two times a day (BID) | RESPIRATORY_TRACT | Status: DC

## 2018-09-14 MED ORDER — ALBUTEROL SULFATE (2.5 MG/3ML) 0.083% IN NEBU: 2.5000 mg | INHALATION_SOLUTION | Freq: Four times a day (QID) | RESPIRATORY_TRACT | Status: DC | PRN

## 2018-09-14 NOTE — Discharge Summary (Signed)
Physician Discharge Summary  Brad Taylor:[id number] DOB: 1951-04-06 DOA: 09/12/2018  PCP: [name], MD  Admit date: 09/12/2018 Discharge date: 09/15/2018  Admitted From: home Disposition:  home   Recommendations for Outpatient Follow-up:  1. F/u pulse ox in 1 wk to see if it has improved- currently being discharged with O2  Discharge Condition:  stable   CODE STATUS:  Full code   Consultations:  neurology    Discharge Diagnoses:  Principal Problem:   COPD with acute bronchitis/   COPD exacerbation Active Problems:   Myoclonic jerking   Stroke ([place])   Aphasia as late effect of stroke   Hemiplegia and hemiparesis following unspecified cerebrovascular disease affecting unspecified side (HCC)   Hypothyroidism   Chronic diastolic CHF (congestive heart failure) ([place])   H/O: lung cancer   Leg wound, right    Brief Summary: Brad Taylor  is a 68 y.o.malewith AAA, COPD, chronic pain problems on narcotics, depression, PTSD, diabetes type 2 on insulin, hyperlipidemia, hypothyroidism, stroke with right-sided hemiparesis, right lower leg chronic wound who presents to the emergency room with recently worsening shortness of breath, lethargy, falls and unintentional movements of the arms and legs. Also noted in ED was that he was hypoxic with pulse ox of 88% with dyspnea.   Hospital Course:  Principal Problem:   COPD with acute bronchitis - On bronchodilators and O2 as needed- he received 1 dose of Solumedrol in the ED but not continued- no subsequent wheezing on my exams - pulse ox drops to 87% on exertion- he will go home with continuous O2  Active Problems:  Myoclonic jerking -  neuro eval requested - EEG was negative for seizures- he has not had any further jerking in the hospital which makes me suspect that it may have been related to hypoxia- I did explain this to the patient and his wife    Aphasia as late effect of stroke   Hemiplegia and hemiparesis  following unspecified cerebrovascular disease affecting unspecified side  - cont Lipitor  DM 2 - ont Lantus and Novolog    Hypothyroidism - Synthroid    H/O: lung cancer      Leg wound, right  - cont dressing changes  Chronic pain - on MS contin, Neurontin   Discharge Exam: Vitals:   09/14/18 1000 09/14/18 1012  BP:    Pulse:    Resp:    Temp:    SpO2: (!) 88% 96%   Vitals:   09/14/18 0731 09/14/18 0745 09/14/18 1000 09/14/18 1012  BP:  137/72    Pulse:  66    Resp:      Temp:  98.3 F (36.8 C)    TempSrc:  Oral    SpO2: 94% 96% (!) 88% 96%  Weight:      Height:        General: Pt is alert, awake, not in acute distress Cardiovascular: RRR, S1/S2 +, no rubs, no gallops Respiratory: CTA bilaterally, no wheezing, no rhonchi Abdominal: Soft, NT, ND, bowel sounds + Extremities: no edema, no cyanosis   Discharge Instructions  Discharge Instructions    Diet - low sodium heart healthy   Complete by:  As directed    Diet Carb Modified   Complete by:  As directed    Increase activity slowly   Complete by:  As directed      Allergies as of 09/14/2018   No Known Allergies     Medication List    TAKE these  medications   albuterol 108 (90 Base) MCG/ACT inhaler Commonly known as:  PROVENTIL HFA;VENTOLIN HFA INHALE 2 PUFFS INTO THE LUNGS EVERY 6 HOURS AS NEEDED FOR WHEEZE OR SHORTNESS OF BREATH   atorvastatin 10 MG tablet Commonly known as:  LIPITOR Take 10 mg by mouth every other day.   busPIRone 10 MG tablet Commonly known as:  BUSPAR Take 0.5 tablets (5 mg total) by mouth 2 (two) times daily as needed (for anxiety). What changed:    how much to take  when to take this   clopidogrel 75 MG tablet Commonly known as:  PLAVIX Take 1 tablet (75 mg total) by mouth daily with breakfast.   gabapentin 300 MG capsule Commonly known as:  NEURONTIN Take 1 capsule (300 mg total) by mouth 3 (three) times daily. What changed:  when to take this    insulin glargine 100 UNIT/ML injection Commonly known as:  LANTUS Inject 45-55 Units into the skin 2 (two) times daily. Take 45 units in the morning and 55 units at night   insulin regular 100 units/mL injection Commonly known as:  HUMULIN R Inject 0.02 mLs (2 Units total) into the skin 3 (three) times daily before meals.   levothyroxine 125 MCG tablet Commonly known as:  SYNTHROID, LEVOTHROID Take 1 tablet (125 mcg total) by mouth daily before breakfast.   metFORMIN 500 MG tablet Commonly known as:  GLUCOPHAGE Take 500 mg by mouth 2 (two) times daily with a meal.   morphine 100 MG 12 hr tablet Commonly known as:  MS CONTIN Take 100 mg by mouth every 12 (twelve) hours as needed for pain.   MOVANTIK 25 MG Tabs tablet Generic drug:  naloxegol oxalate TAKE 1 TABLET BY MOUTH EVERY DAY   oxymetazoline 0.05 % nasal spray Commonly known as:  AFRIN Place 2 sprays into both nostrils 2 (two) times daily as needed for congestion.   RABEprazole 20 MG tablet Commonly known as:  ACIPHEX Take 2 tablets (40 mg total) by mouth daily before breakfast.   tamsulosin 0.4 MG Caps capsule Commonly known as:  FLOMAX Take 0.4 mg by mouth at bedtime.   Tiotropium Bromide-Olodaterol 2.5-2.5 MCG/ACT Aers Commonly known as:  STIOLTO RESPIMAT Inhale 2 Inhalers into the lungs daily. What changed:  how much to take   traZODone 100 MG tablet Commonly known as:  DESYREL Take 3 tablets (300 mg total) by mouth at bedtime.   venlafaxine XR 150 MG 24 hr capsule Commonly known as:  EFFEXOR-XR Take 1 capsule (150 mg total) by mouth daily with breakfast. What changed:  when to take this      [place] Follow up.   Why:  oxygen, 3 n 1 Contact information: [place] [address] [phone]        [facility], [facility] Follow up.   Specialty:  Home Health Services Why:  [facility], HHPT, [place] Contact information: [place] [address] [phone]          No Known Allergies   Procedures/Studies: EEG  Dg Chest 2 View  Result Date: 09/12/2018 CLINICAL DATA:  Altered mental status.  Recent fall EXAM: CHEST - 2 VIEW COMPARISON:  Chest CT October 28, 2017 and chest radiograph November 03, 2017 FINDINGS: There is chronic scarring and volume loss on the right with elevation the right hemidiaphragm. There is no well-defined mass currently evident in this area of scarring and retraction.  The left lung is clear. Heart size is normal. Pulmonary vascularity on the left is normal. There is distortion of pulmonary vascularity on the right due to the scarring. There is aortic atherosclerosis. No bone lesions evident. IMPRESSION: Persistent scarring on the right with volume loss and retraction in the perihilar region. Next chronic elevation of right hemidiaphragm is stable. Left lung is clear. Stable cardiac silhouette. There is aortic atherosclerosis. Electronically Signed   By: Lowella Grip III M.D.   On: 09/12/2018 13:40   Ct Head Wo Contrast  Result Date: 09/12/2018 CLINICAL DATA:  69 year old male with generalized weakness, increased falls, tremors, jerking motions EXAM: CT HEAD WITHOUT CONTRAST CT CERVICAL SPINE WITHOUT CONTRAST TECHNIQUE: Multidetector CT imaging of the head and cervical spine was performed following the standard protocol without intravenous contrast. Multiplanar CT image reconstructions of the cervical spine were also generated. COMPARISON:  Recent prior brain MRI 09/05/2018 FINDINGS: CT HEAD FINDINGS Brain: No evidence of acute infarction, hemorrhage, hydrocephalus, extra-axial collection or mass lesion/mass effect. Confluent areas of periventricular white matter hypotension with more patchy areas of subcortical and deep white matter hypoattenuation remain nonspecific but most consistent with chronic microvascular ischemic white matter disease. Small lacunar infarct in the left  caudate head. Vascular: No hyperdense vessel or unexpected calcification. Skull: Normal. Negative for fracture or focal lesion. Sinuses/Orbits: No acute finding. Other: None. CT CERVICAL SPINE FINDINGS Alignment: Normal. Skull base and vertebrae: No acute fracture. No primary bone lesion or focal pathologic process. Soft tissues and spinal canal: No prevertebral fluid or swelling. No visible canal hematoma. Disc levels: Minimal multilevel cervical spondylosis. No level specific disease. Upper chest: Negative. Other: None IMPRESSION: CT HEAD 1. No acute intracranial abnormality. 2. Similar appearance of extensive chronic microvascular ischemic white matter disease. 3. Stable remote prior left caudate lacunar infarct. CT CSPINE 1. No acute fracture or malalignment. Electronically Signed   By: Jacqulynn Cadet M.D.   On: 09/12/2018 14:28   Ct Angio Chest Pe W And/or Wo Contrast  Result Date: 09/12/2018 CLINICAL DATA:  68 year old male with a nebulous constellation of symptoms including generalized weakness, increased falls, tremors, jerking motions. EXAM: CT ANGIOGRAPHY CHEST CT ABDOMEN AND PELVIS WITH CONTRAST TECHNIQUE: Multidetector CT imaging of the chest was performed using the standard protocol during bolus administration of intravenous contrast. Multiplanar CT image reconstructions and MIPs were obtained to evaluate the vascular anatomy. Multidetector CT imaging of the abdomen and pelvis was performed using the standard protocol during bolus administration of intravenous contrast. CONTRAST:  142mL ISOVUE-370 IOPAMIDOL (ISOVUE-370) INJECTION 76% COMPARISON:  Most recent prior CT scan of the chest 10/28/2017; prior CT scan of the abdomen and pelvis 12/10/2013 FINDINGS: CTA CHEST FINDINGS Cardiovascular: Adequate opacification of the pulmonary arteries to the lobar level. No evidence of central filling defect to suggest acute pulmonary embolus. The main pulmonary artery is enlarged at 4.3 cm in diameter.  Calcifications present throughout the aorta. Stable mild aneurysmal dilatation of the ascending thoracic aorta with a maximal diameter of 4.3 cm. Thickened and calcified aortic valve. Calcifications throughout the coronary arteries. Cardiomegaly. No pericardial effusion. Mediastinum/Nodes: Unremarkable CT appearance of the thyroid gland. No suspicious mediastinal or hilar adenopathy. No soft tissue mediastinal mass. The thoracic esophagus is unremarkable. Lungs/Pleura: Surgical changes suggest prior right upper lobectomy. Chronic atelectasis and scarring is present in the medial aspect of the right middle lobe. No definite masslike area to suggest recurrent tumor. No significant interval change compared to prior imaging from March of 2019. Slightly increased respiratory motion  artifact and dependent atelectasis. Musculoskeletal: No acute fracture or aggressive appearing lytic or blastic osseous lesion. Review of the MIP images confirms the above findings. CT ABDOMEN and PELVIS FINDINGS Hepatobiliary: No focal liver abnormality is seen. Status post cholecystectomy. No biliary dilatation. Pancreas: Unremarkable. No pancreatic ductal dilatation or surrounding inflammatory changes. Spleen: Normal in size without focal abnormality. Adrenals/Urinary Tract: Normal adrenal glands. Extensive motion and streak artifact limits evaluation of the kidneys. Multiple circumscribed low-attenuation lesions demonstrate no significant interval change dating back to 2015 consistent with simple cysts. Nonspecific perinephric stranding bilaterally. No hydronephrosis or nephrolithiasis. Stomach/Bowel: Large colonic stool burden suggests constipation. No evidence of focal bowel wall thickening or bowel obstruction. Normal appendix visualized. Vascular/Lymphatic: Extensive atherosclerotic vascular calcifications throughout the abdominal aorta. Mild aneurysmal dilatation with a maximal diameter of 3.3 cm, unchanged. Aneurysmal dilatation of  the left common iliac artery to 2.6 cm and the right common iliac artery to 2.8 cm, perhaps slightly enlarged compared to 2.4 and 2.5 cm respectively in 2015. Reproductive: Prostate is unremarkable. Other: No abdominal wall hernia or abnormality. No abdominopelvic ascites. Musculoskeletal: No acute fracture or aggressive appearing lytic or blastic osseous lesion. Review of the MIP images confirms the above findings. IMPRESSION: CTA CHEST 1. No evidence of acute pulmonary embolus, pneumonia or other acute cardiopulmonary process. 2. Surgical changes of prior right upper lobectomy with residual scarring but no convincing evidence of recurrent disease. 3. Enlarged main pulmonary artery suggesting underlying pulmonary arterial hypertension. 4. Stable thoracic aortic aneurysm with a maximal diameter of 4.3 cm. Recommend annual imaging followup by CTA or MRA. This recommendation follows 2010 ACCF/AHA/AATS/ACR/ASA/SCA/SCAI/SIR/STS/SVM Guidelines for the Diagnosis and Management of Patients with Thoracic Aortic Disease. Circulation. 2010; 121: P379-K240. Aortic aneurysm NOS (ICD10-I71.9). 5. Thickened and calcified aortic valve raises concern for underlying aortic valvular stenosis. 6. Cardiomegaly with coronary artery disease. CTA ABD/PELVIS 1. No acute abnormality within the abdomen or pelvis. 2. Extensive artifact related to patient motion and streak from arms down positioning. 3. Large colonic stool burden suggesting constipation. 4. Stable mild aneurysmal dilatation of the infrarenal abdominal aorta at 3.3 cm. Recommend followup by ultrasound in 3 years. This recommendation follows ACR consensus guidelines: White Paper of the ACR Incidental Findings Committee II on Vascular Findings. J Am Coll Radiol 2013; 10:789-794. 5. Slight interval progression (compared to 2015) of bilateral common iliac artery aneurysms measuring up to 2.6 cm on the left and 2.8 cm on the right. 6. Extensive peripheral arterial disease in the  visualized iliac and femoral arteries. Could patient's lower extremity symptoms be secondary to arterial insufficiency? Electronically Signed   By: Jacqulynn Cadet M.D.   On: 09/12/2018 14:43   Ct Cervical Spine Wo Contrast  Result Date: 09/12/2018 CLINICAL DATA:  68 year old male with generalized weakness, increased falls, tremors, jerking motions EXAM: CT HEAD WITHOUT CONTRAST CT CERVICAL SPINE WITHOUT CONTRAST TECHNIQUE: Multidetector CT imaging of the head and cervical spine was performed following the standard protocol without intravenous contrast. Multiplanar CT image reconstructions of the cervical spine were also generated. COMPARISON:  Recent prior brain MRI 09/05/2018 FINDINGS: CT HEAD FINDINGS Brain: No evidence of acute infarction, hemorrhage, hydrocephalus, extra-axial collection or mass lesion/mass effect. Confluent areas of periventricular white matter hypotension with more patchy areas of subcortical and deep white matter hypoattenuation remain nonspecific but most consistent with chronic microvascular ischemic white matter disease. Small lacunar infarct in the left caudate head. Vascular: No hyperdense vessel or unexpected calcification. Skull: Normal. Negative for fracture or focal lesion. Sinuses/Orbits: No acute  finding. Other: None. CT CERVICAL SPINE FINDINGS Alignment: Normal. Skull base and vertebrae: No acute fracture. No primary bone lesion or focal pathologic process. Soft tissues and spinal canal: No prevertebral fluid or swelling. No visible canal hematoma. Disc levels: Minimal multilevel cervical spondylosis. No level specific disease. Upper chest: Negative. Other: None IMPRESSION: CT HEAD 1. No acute intracranial abnormality. 2. Similar appearance of extensive chronic microvascular ischemic white matter disease. 3. Stable remote prior left caudate lacunar infarct. CT CSPINE 1. No acute fracture or malalignment. Electronically Signed   By: Jacqulynn Cadet M.D.   On: 09/12/2018  14:28   Mr Jeri Cos WE Contrast  Result Date: 09/05/2018 CLINICAL DATA:  68 year old male with suspicion of partial seizures limited to the left upper extremity. Previous stroke with chronic speech deficit and right side weakness. Residual malaise and weakness after flu like symptoms. Diabetes. EXAM: MRI HEAD WITHOUT AND WITH CONTRAST TECHNIQUE: Multiplanar, multiecho pulse sequences of the brain and surrounding structures were obtained without and with intravenous contrast. CONTRAST:  54mL MULTIHANCE GADOBENATE DIMEGLUMINE 529 MG/ML IV SOLN COMPARISON:  Brain MRI and intracranial MRA 09/27/2013 FINDINGS: Brain: No restricted diffusion or evidence of acute infarction. There are chronic lacunar infarcts in the bilateral deep gray matter nuclei, greater on the left, left corona radiata, superimposed confluent bilateral cerebral white matter T2 and FLAIR hyperintensity, small area of chronic cortical encephalomalacia in the posterior mesial left temporal lobe (series 17, image 13, not directly affecting the left hippocampus), and occasional chronic micro hemorrhages (left occipital lobe series 13, image 25). There is left midbrain and brainstem Wallerian degeneration. There is a small chronic lacunar infarct in the left cerebellar nuclei. Cerebral volume has not significantly changed since 2015. No midline shift, mass effect, evidence of mass lesion, ventriculomegaly, extra-axial collection or acute intracranial hemorrhage. Cervicomedullary junction and pituitary are within normal limits. No abnormal enhancement identified.  No dural thickening. Vascular: Major intracranial vascular flow voids are stable since 2015 with mild generalized intracranial artery tortuosity. The major dural venous sinuses are enhancing and appear to be patent. Skull and upper cervical spine: Negative visible cervical spine. Normal bone marrow signal. Sinuses/Orbits: Stable, negative orbits. Moderate left maxillary sinus mucosal thickening.  Scattered additional sinus mucosal thickening. Other: Confluent retained secretions in the right nasopharynx (series 10, image 4). The mastoids remain clear. Visible internal auditory structures appear normal. Scalp and face soft tissues appear negative. IMPRESSION: 1.  No acute intracranial abnormality. 2. Advanced predominantly small vessel chronic ischemic disease, most pronounced in the left hemisphere and with left brainstem Wallerian degeneration. There is a small area of chronic cortical encephalomalacia in the left mesial temporal lobe not directly affecting the left hippocampus. 3. Paranasal sinus inflammation and retained secretions in the nasopharynx. Electronically Signed   By: Genevie Ann M.D.   On: 09/05/2018 14:26   Ct Abdomen Pelvis W Contrast  Result Date: 09/12/2018 CLINICAL DATA:  68 year old male with a nebulous constellation of symptoms including generalized weakness, increased falls, tremors, jerking motions. EXAM: CT ANGIOGRAPHY CHEST CT ABDOMEN AND PELVIS WITH CONTRAST TECHNIQUE: Multidetector CT imaging of the chest was performed using the standard protocol during bolus administration of intravenous contrast. Multiplanar CT image reconstructions and MIPs were obtained to evaluate the vascular anatomy. Multidetector CT imaging of the abdomen and pelvis was performed using the standard protocol during bolus administration of intravenous contrast. CONTRAST:  15mL ISOVUE-370 IOPAMIDOL (ISOVUE-370) INJECTION 76% COMPARISON:  Most recent prior CT scan of the chest 10/28/2017; prior CT scan of the  abdomen and pelvis 12/10/2013 FINDINGS: CTA CHEST FINDINGS Cardiovascular: Adequate opacification of the pulmonary arteries to the lobar level. No evidence of central filling defect to suggest acute pulmonary embolus. The main pulmonary artery is enlarged at 4.3 cm in diameter. Calcifications present throughout the aorta. Stable mild aneurysmal dilatation of the ascending thoracic aorta with a maximal  diameter of 4.3 cm. Thickened and calcified aortic valve. Calcifications throughout the coronary arteries. Cardiomegaly. No pericardial effusion. Mediastinum/Nodes: Unremarkable CT appearance of the thyroid gland. No suspicious mediastinal or hilar adenopathy. No soft tissue mediastinal mass. The thoracic esophagus is unremarkable. Lungs/Pleura: Surgical changes suggest prior right upper lobectomy. Chronic atelectasis and scarring is present in the medial aspect of the right middle lobe. No definite masslike area to suggest recurrent tumor. No significant interval change compared to prior imaging from March of 2019. Slightly increased respiratory motion artifact and dependent atelectasis. Musculoskeletal: No acute fracture or aggressive appearing lytic or blastic osseous lesion. Review of the MIP images confirms the above findings. CT ABDOMEN and PELVIS FINDINGS Hepatobiliary: No focal liver abnormality is seen. Status post cholecystectomy. No biliary dilatation. Pancreas: Unremarkable. No pancreatic ductal dilatation or surrounding inflammatory changes. Spleen: Normal in size without focal abnormality. Adrenals/Urinary Tract: Normal adrenal glands. Extensive motion and streak artifact limits evaluation of the kidneys. Multiple circumscribed low-attenuation lesions demonstrate no significant interval change dating back to 2015 consistent with simple cysts. Nonspecific perinephric stranding bilaterally. No hydronephrosis or nephrolithiasis. Stomach/Bowel: Large colonic stool burden suggests constipation. No evidence of focal bowel wall thickening or bowel obstruction. Normal appendix visualized. Vascular/Lymphatic: Extensive atherosclerotic vascular calcifications throughout the abdominal aorta. Mild aneurysmal dilatation with a maximal diameter of 3.3 cm, unchanged. Aneurysmal dilatation of the left common iliac artery to 2.6 cm and the right common iliac artery to 2.8 cm, perhaps slightly enlarged compared to 2.4  and 2.5 cm respectively in 2015. Reproductive: Prostate is unremarkable. Other: No abdominal wall hernia or abnormality. No abdominopelvic ascites. Musculoskeletal: No acute fracture or aggressive appearing lytic or blastic osseous lesion. Review of the MIP images confirms the above findings. IMPRESSION: CTA CHEST 1. No evidence of acute pulmonary embolus, pneumonia or other acute cardiopulmonary process. 2. Surgical changes of prior right upper lobectomy with residual scarring but no convincing evidence of recurrent disease. 3. Enlarged main pulmonary artery suggesting underlying pulmonary arterial hypertension. 4. Stable thoracic aortic aneurysm with a maximal diameter of 4.3 cm. Recommend annual imaging followup by CTA or MRA. This recommendation follows 2010 ACCF/AHA/AATS/ACR/ASA/SCA/SCAI/SIR/STS/SVM Guidelines for the Diagnosis and Management of Patients with Thoracic Aortic Disease. Circulation. 2010; 121: R427-C623. Aortic aneurysm NOS (ICD10-I71.9). 5. Thickened and calcified aortic valve raises concern for underlying aortic valvular stenosis. 6. Cardiomegaly with coronary artery disease. CTA ABD/PELVIS 1. No acute abnormality within the abdomen or pelvis. 2. Extensive artifact related to patient motion and streak from arms down positioning. 3. Large colonic stool burden suggesting constipation. 4. Stable mild aneurysmal dilatation of the infrarenal abdominal aorta at 3.3 cm. Recommend followup by ultrasound in 3 years. This recommendation follows ACR consensus guidelines: White Paper of the ACR Incidental Findings Committee II on Vascular Findings. J Am Coll Radiol 2013; 10:789-794. 5. Slight interval progression (compared to 2015) of bilateral common iliac artery aneurysms measuring up to 2.6 cm on the left and 2.8 cm on the right. 6. Extensive peripheral arterial disease in the visualized iliac and femoral arteries. Could patient's lower extremity symptoms be secondary to arterial insufficiency?  Electronically Signed   By: Dellis Filbert.D.  On: 09/12/2018 14:43     The results of significant diagnostics from this hospitalization (including imaging, microbiology, ancillary and laboratory) are listed below for reference.     Microbiology: No results found for this or any previous visit (from the past 240 hour(s)).   Labs: BNP (last 3 results) Recent Labs    12/03/17 1007 09/12/18 1221  BNP 61.9 967.5*   Basic Metabolic Panel: Recent Labs  Lab 09/12/18 1221 09/12/18 1312  NA 133* 131*  K 4.3 3.9  CL 90*  --   CO2 28  --   GLUCOSE 110*  --   BUN 12  --   CREATININE 0.78  --   CALCIUM 8.9  --    Liver Function Tests: Recent Labs  Lab 09/12/18 1221  AST 21  ALT 19  ALKPHOS 105  BILITOT 1.2  PROT 6.6  ALBUMIN 3.2*   No results for input(s): LIPASE, AMYLASE in the last 168 hours. No results for input(s): AMMONIA in the last 168 hours. CBC: Recent Labs  Lab 09/12/18 1221 09/12/18 1312  WBC 9.2  --   NEUTROABS 6.5  --   HGB 14.5 14.3  HCT 44.4 42.0  MCV 89.0  --   PLT 278  --    Cardiac Enzymes: Recent Labs  Lab 09/12/18 1221  CKTOTAL 63   BNP: Invalid input(s): POCBNP CBG: Recent Labs  Lab 09/13/18 1243 09/13/18 1704 09/13/18 2202 09/14/18 0743 09/14/18 1128  GLUCAP 134* 112* 129* 82 140*   D-Dimer No results for input(s): DDIMER in the last 72 hours. Hgb A1c No results for input(s): HGBA1C in the last 72 hours. Lipid Profile No results for input(s): CHOL, HDL, LDLCALC, TRIG, CHOLHDL, LDLDIRECT in the last 72 hours. Thyroid function studies No results for input(s): TSH, T4TOTAL, T3FREE, THYROIDAB in the last 72 hours.  Invalid input(s): FREET3 Anemia work up No results for input(s): VITAMINB12, FOLATE, FERRITIN, TIBC, IRON, RETICCTPCT in the last 72 hours. Urinalysis    Component Value Date/Time   COLORURINE YELLOW 09/12/2018 1532   APPEARANCEUR CLEAR 09/12/2018 1532   LABSPEC 1.036 (H) 09/12/2018 1532   PHURINE 7.0  09/12/2018 1532   GLUCOSEU NEGATIVE 09/12/2018 1532   HGBUR NEGATIVE 09/12/2018 1532   BILIRUBINUR NEGATIVE 09/12/2018 1532   KETONESUR NEGATIVE 09/12/2018 1532   PROTEINUR NEGATIVE 09/12/2018 1532   UROBILINOGEN 1.0 09/25/2013 1009   NITRITE NEGATIVE 09/12/2018 1532   LEUKOCYTESUR NEGATIVE 09/12/2018 1532   Sepsis Labs Invalid input(s): PROCALCITONIN,  WBC,  LACTICIDVEN Microbiology No results found for this or any previous visit (from the past 240 hour(s)).   Time coordinating discharge in minutes: 65  SIGNED:   Debbe Odea, MD  [facility] 09/15/2018, 5:30 PM Pager   If 7PM-7AM, please contact night-coverage [email or web address] Password [email or web address]

## 2018-09-14 NOTE — Progress Notes (Signed)
Nutrition Consult/Brief Note  RD consulted per COPD Focused Protocol.  Wt Readings from Last 15 Encounters:  09/12/18 110 kg  08/27/18 110.2 kg  08/13/18 110.7 kg  04/15/18 113.4 kg  04/08/18 113.4 kg  04/01/18 108 kg  03/30/18 118.8 kg  03/18/18 113.4 kg  02/17/18 113.9 kg  12/30/17 117 kg  12/16/17 117 kg  12/06/17 120.1 kg  11/28/17 113.4 kg  11/07/17 112 kg  11/03/17 108.9 kg   Body mass index is 34.8 kg/m. Patient meets criteria for Obesity Class I based on current BMI.   Current diet order is Carbohydrate Modified, patient is consuming approximately 100% of meals at this time. Labs and medications reviewed.   No nutrition interventions warranted at this time. If nutrition issues arise, please consult RD.   Brad Taylor, RD, LDN Pager #: [phone] After-Hours Pager #: [phone]

## 2018-09-14 NOTE — Plan of Care (Signed)
  Problem: Education: Goal: Knowledge of General Education information will improve Description Including pain rating scale, medication(s)/side effects and non-pharmacologic comfort measures Outcome: Progressing   Problem: Health Behavior/Discharge Planning: Goal: Ability to manage health-related needs will improve Outcome: Progressing   Problem: Clinical Measurements: Goal: Ability to maintain clinical measurements within normal limits will improve Outcome: Progressing Goal: Will remain free from infection Outcome: Progressing Goal: Diagnostic test results will improve Outcome: Progressing Goal: Respiratory complications will improve Outcome: Progressing Goal: Cardiovascular complication will be avoided Outcome: Progressing   Problem: Safety: Goal: Ability to remain free from injury will improve Outcome: Progressing   Problem: Skin Integrity: Goal: Risk for impaired skin integrity will decrease Outcome: Progressing   Problem: Education: Goal: Knowledge of disease or condition will improve Outcome: Progressing Goal: Knowledge of the prescribed therapeutic regimen will improve Outcome: Progressing Goal: Individualized Educational Video(s) Outcome: Progressing   Problem: Activity: Goal: Ability to tolerate increased activity will improve Outcome: Progressing Goal: Will verbalize the importance of balancing activity with adequate rest periods Outcome: Progressing   Problem: Respiratory: Goal: Ability to maintain a clear airway will improve Outcome: Progressing Goal: Levels of oxygenation will improve Outcome: Progressing Goal: Ability to maintain adequate ventilation will improve Outcome: Progressing

## 2018-09-14 NOTE — Progress Notes (Signed)
Reason for consult: Body jerking  Subjective: Patient is back to his baseline.  No further body jerking movements.   ROS: negative except above  Examination  Vital signs in last 24 hours: Temp:  [98.2 F (36.8 C)-98.3 F (36.8 C)] 98.3 F (36.8 C) (02/10 0745) Pulse Rate:  [66-69] 66 (02/10 0745) Resp:  [18] 18 (02/09 2300) BP: (137-139)/(68-72) 137/72 (02/10 0745) SpO2:  [88 %-97 %] 96 % (02/10 1012)  General: lying in bed CVS: pulse-normal rate and rhythm RS: breathing comfortably Extremities: normal   Neuro: MS: Alert, oriented, follows commands CN: pupils equal and reactive,  EOMI, face symmetric, tongue midline, normal sensation over face, Motor: 5/5 strength in all 4 extremities Coordination: normal Gait: not tested  Basic Metabolic Panel: Recent Labs  Lab 09/12/18 1221 09/12/18 1312  NA 133* 131*  K 4.3 3.9  CL 90*  --   CO2 28  --   GLUCOSE 110*  --   BUN 12  --   CREATININE 0.78  --   CALCIUM 8.9  --     CBC: Recent Labs  Lab 09/12/18 1221 09/12/18 1312  WBC 9.2  --   NEUTROABS 6.5  --   HGB 14.5 14.3  HCT 44.4 42.0  MCV 89.0  --   PLT 278  --      Coagulation Studies: No results for input(s): LABPROT, INR in the last 72 hours.  Imaging Reviewed: CT head shows prior infarct    ASSESSMENT AND PLAN  68 year old male who presented with myoclonus, improved without any treatment. BUN Normal, no obvious on medications that would cause myoclonus.  Has COPD and sleep apnea and does not wear CPAP possible etiology for myoclonus. EEG negative for epileptiform discharges.   Recommendations  -No need to start patient on antiepileptic agents -Recommend wearing CPAP at night   Briahnna Harries [facility] Pager Number [phone] For questions after 7pm please refer to AMION to reach the Neurologist on call

## 2018-09-14 NOTE — Care Management Note (Addendum)
Case Management Note  Patient Details  Name: CROSBY ORIORDAN MRN: [id number] Date of Birth: [birth year]  Subjective/Objective:     From home with wife, COPD ex, NCM offered choice to patient and wife, wife chose [facility] for [place], [place], [place]. From Medicare.gov list. Referral given to Butch Penny with [facility].  Soc will begin 24-48 hrs post dc.  Patient will also need 3 n 1.  James with [facility] will bring 3 n 1 to room.  Need orders prior to dc.    16:45 Tomi Bamberger RN, BSN - RN informs NCM that patient needs home oxygen. Referral given to [facility] with [facility].  Need order and sats.             Action/Plan: DC home when ready.  Expected Discharge Date:                  Expected Discharge Plan:  [place]  In-House Referral:     Discharge planning Services  CM Consult  Post Acute Care Choice:  Durable Medical Equipment, Home Health Choice offered to:  Spouse  DME Arranged:  3-N-1oxygen DME Agency:  [place] Arranged:  PT, OT, RN, Disease Management [place] Agency:  [place]  Status of Service:  Completed, signed off  If discussed at [place] of Stay Meetings, dates discussed:    Additional Comments:  Zenon Mayo, RN 09/14/2018, 12:47 PM

## 2018-09-14 NOTE — Progress Notes (Signed)
SATURATION QUALIFICATIONS: (This note is used to comply with regulatory documentation for home oxygen)  Patient Saturations on Room Air at Rest = 91%  Patient Saturations on Room Air while Ambulating = 87%  Patient Saturations on 1 Liters of oxygen while Ambulating = 95%  Please briefly explain why patient needs home oxygen: patients oxygen level decreases to 87% while ambulating on room air.

## 2018-09-14 NOTE — Progress Notes (Signed)
Patient ready for discharge home with wife. Patient did not want to wait to speak with neurologist. Relayed to patient negative EEG results per MD.  Reviewed discharge instructions and f/u appointments with patieint and his wife.

## 2018-09-14 NOTE — Progress Notes (Signed)
Occupational Therapy Treatment Patient Details Name: Brad Taylor MRN: [id number] DOB: [birth year] Today's Date: 09/14/2018    History of present illness Pt is a 68 y/o male presenting with recently worsening shortness of breath, lethargy, falls and unintentional movements of the arms and legs.  Reports increased falls and increased assistance needed over the past 2 weeks.  CT reveals extensive chronic microvascular ischemic white matter disease and a stable remote prior left caudate lacunar infarct, MRI from 09/05/18 negative for acute abnormality. PMH: AAA, COPD, diabetes, CVA with R sided hemiparesis, PTSD, R lower leg chronic wound.    OT comments  Session focused on dressing and functional activity tolerance, as measured by SpO2 during functional mobility. Pt required mod A to don LB clothing, suspect pt could do more on his own but wife eager to assist. Pt initially with 1 L [place] while sitting EOB, SpO2 95%. Pt removed O2 and completed UB/LB dressing, with O2 dropping to 87% and pt visibly winded. Edu provided re pursed lip breathing and rest breaks. Pt completed 35 ft of functional mobility with no AD, 1 standing rest break with SpO2 reading 87%. Pt able to rebound following rest break seated to 91%. HHOT upon d/c remains appropriate.    Follow Up Recommendations  Home health OT;Supervision/Assistance - 24 hour    Equipment Recommendations  3 in 1 bedside commode       Precautions / Restrictions Precautions Precautions: Fall Restrictions Weight Bearing Restrictions: No       Mobility Bed Mobility               General bed mobility comments: Pt sitting EOB upon arrival  Transfers Overall transfer level: Needs assistance Equipment used: None Transfers: Sit to/from Stand Sit to Stand: Min guard              Balance Overall balance assessment: Needs assistance;History of Falls Sitting-balance support: No upper extremity supported;Feet supported Sitting balance-Leahy  Scale: Fair     Standing balance support: During functional activity;No upper extremity supported Standing balance-Leahy Scale: Fair Standing balance comment: Furniture walker                           ADL either performed or assessed with clinical judgement   ADL Overall ADL's : Needs assistance/impaired                 Upper Body Dressing : Set up;Sitting   Lower Body Dressing: Moderate assistance;Sit to/from stand   Toilet Transfer: Minimal assistance;Ambulation;RW Toilet Transfer Details (indicate cue type and reason): simulated in room          Functional mobility during ADLs: Min guard;Cueing for safety General ADL Comments: Pt able to complete 35 ft of functional mobility without AD               Cognition Arousal/Alertness: Awake/alert Behavior During Therapy: WFL for tasks assessed/performed Overall Cognitive Status: Impaired/Different from baseline Area of Impairment: Memory                     Memory: Decreased short-term memory Following Commands: Follows multi-step commands consistently Safety/Judgement: Decreased awareness of safety     General Comments: Pt overall WFL, pt "recognized" therapist despite having never met              General Comments pt's wife present throughout session    Pertinent Vitals/ Pain       Pain Assessment: No/denies pain  Frequency  Min 2X/week        Progress Toward Goals  OT Goals(current goals can now be found in the care plan section)  Progress towards OT goals: Progressing toward goals  Acute Rehab OT Goals Patient Stated Goal: to get back home  OT Goal Formulation: With patient Time For Goal Achievement: 09/27/18 Potential to Achieve Goals: Good  Plan Discharge plan remains appropriate       AM-PAC OT "6 Clicks" Daily Activity     Outcome Measure   Help from another person eating meals?: None Help from another person taking care of personal grooming?: A  Little Help from another person toileting, which includes using toliet, bedpan, or urinal?: A Little Help from another person bathing (including washing, rinsing, drying)?: A Little Help from another person to put on and taking off regular upper body clothing?: None Help from another person to put on and taking off regular lower body clothing?: A Lot 6 Click Score: 19    End of Session    OT Visit Diagnosis: Other abnormalities of gait and mobility (R26.89)   Activity Tolerance Patient tolerated treatment well   Patient Left in bed;with family/visitor present;with call bell/phone within reach   Nurse Communication Mobility status        Time: 1583-0940 OT Time Calculation (min): 17 min  Charges: OT General Charges $OT Visit: 1 Visit OT Treatments $Self Care/Home Management : 8-22 mins  Curtis Sites OTR/L  09/14/2018, 4:24 PM

## 2018-09-15 DIAGNOSIS — J209 Acute bronchitis, unspecified: Secondary | ICD-10-CM | POA: Diagnosis not present

## 2018-09-15 DIAGNOSIS — J441 Chronic obstructive pulmonary disease with (acute) exacerbation: Secondary | ICD-10-CM | POA: Diagnosis not present

## 2018-09-15 DIAGNOSIS — J440 Chronic obstructive pulmonary disease with acute lower respiratory infection: Secondary | ICD-10-CM | POA: Diagnosis not present

## 2018-09-15 NOTE — Consult Note (Signed)
            [facility] CM Primary Care Navigator  09/15/2018  Brad Taylor June 24, 1951 [id number]   Attemptto seepatient at the bedside to identify possible discharge needs buthe wasalreadydischarged. Patient went home with home health services per therapy recommendation.  Per MD note,patient was admitted forworsening shortness of breath, lethargy, falls and unintentional movements of arms and legs. (COPD exacerbation with acute bronchitis, myoclonic jerking, aphasia as late effect of stroke)  Patient noted with a follow-up appointment with neurology in 1 month.  Primary care provider's office is listed as providing transition of care (TOC) follow-up.    For additional questions please contact:  Edwena Felty A. Carmie Lanpher, BSN, RN-BC [facility] PRIMARY CARE Navigator Cell: [phone]

## 2018-09-16 DIAGNOSIS — L97812 Non-pressure chronic ulcer of other part of right lower leg with fat layer exposed: Secondary | ICD-10-CM | POA: Diagnosis not present

## 2018-09-16 DIAGNOSIS — J449 Chronic obstructive pulmonary disease, unspecified: Secondary | ICD-10-CM | POA: Diagnosis not present

## 2018-09-16 DIAGNOSIS — E11622 Type 2 diabetes mellitus with other skin ulcer: Secondary | ICD-10-CM | POA: Diagnosis not present

## 2018-09-16 DIAGNOSIS — Z794 Long term (current) use of insulin: Secondary | ICD-10-CM | POA: Diagnosis not present

## 2018-09-16 DIAGNOSIS — Z9981 Dependence on supplemental oxygen: Secondary | ICD-10-CM | POA: Diagnosis not present

## 2018-09-16 DIAGNOSIS — I69351 Hemiplegia and hemiparesis following cerebral infarction affecting right dominant side: Secondary | ICD-10-CM | POA: Diagnosis not present

## 2018-09-16 DIAGNOSIS — Z7982 Long term (current) use of aspirin: Secondary | ICD-10-CM | POA: Diagnosis not present

## 2018-09-16 DIAGNOSIS — F17219 Nicotine dependence, cigarettes, with unspecified nicotine-induced disorders: Secondary | ICD-10-CM | POA: Diagnosis not present

## 2018-09-16 DIAGNOSIS — Z85118 Personal history of other malignant neoplasm of bronchus and lung: Secondary | ICD-10-CM | POA: Diagnosis not present

## 2018-09-16 DIAGNOSIS — Z9181 History of falling: Secondary | ICD-10-CM | POA: Diagnosis not present

## 2018-09-16 DIAGNOSIS — I69320 Aphasia following cerebral infarction: Secondary | ICD-10-CM | POA: Diagnosis not present

## 2018-09-16 DIAGNOSIS — B965 Pseudomonas (aeruginosa) (mallei) (pseudomallei) as the cause of diseases classified elsewhere: Secondary | ICD-10-CM | POA: Diagnosis not present

## 2018-09-17 ENCOUNTER — Encounter (HOSPITAL_BASED_OUTPATIENT_CLINIC_OR_DEPARTMENT_OTHER): Payer: Medicare HMO | Attending: Internal Medicine

## 2018-09-17 DIAGNOSIS — F17210 Nicotine dependence, cigarettes, uncomplicated: Secondary | ICD-10-CM | POA: Diagnosis not present

## 2018-09-17 DIAGNOSIS — I69320 Aphasia following cerebral infarction: Secondary | ICD-10-CM | POA: Diagnosis not present

## 2018-09-17 DIAGNOSIS — L97412 Non-pressure chronic ulcer of right heel and midfoot with fat layer exposed: Secondary | ICD-10-CM | POA: Diagnosis not present

## 2018-09-17 DIAGNOSIS — E11622 Type 2 diabetes mellitus with other skin ulcer: Secondary | ICD-10-CM | POA: Insufficient documentation

## 2018-09-17 DIAGNOSIS — L97812 Non-pressure chronic ulcer of other part of right lower leg with fat layer exposed: Secondary | ICD-10-CM | POA: Insufficient documentation

## 2018-09-17 DIAGNOSIS — J449 Chronic obstructive pulmonary disease, unspecified: Secondary | ICD-10-CM | POA: Insufficient documentation

## 2018-09-17 DIAGNOSIS — I69359 Hemiplegia and hemiparesis following cerebral infarction affecting unspecified side: Secondary | ICD-10-CM | POA: Diagnosis not present

## 2018-09-17 DIAGNOSIS — L97512 Non-pressure chronic ulcer of other part of right foot with fat layer exposed: Secondary | ICD-10-CM | POA: Insufficient documentation

## 2018-09-17 DIAGNOSIS — E11621 Type 2 diabetes mellitus with foot ulcer: Secondary | ICD-10-CM | POA: Insufficient documentation

## 2018-09-17 LAB — GLUCOSE, CAPILLARY: Glucose-Capillary: 246 mg/dL — ABNORMAL HIGH (ref 70–99)

## 2018-09-18 ENCOUNTER — Telehealth: Payer: Self-pay | Admitting: Family Medicine

## 2018-09-18 DIAGNOSIS — E11622 Type 2 diabetes mellitus with other skin ulcer: Secondary | ICD-10-CM | POA: Diagnosis not present

## 2018-09-18 DIAGNOSIS — I69351 Hemiplegia and hemiparesis following cerebral infarction affecting right dominant side: Secondary | ICD-10-CM | POA: Diagnosis not present

## 2018-09-18 DIAGNOSIS — Z7982 Long term (current) use of aspirin: Secondary | ICD-10-CM | POA: Diagnosis not present

## 2018-09-18 DIAGNOSIS — Z85118 Personal history of other malignant neoplasm of bronchus and lung: Secondary | ICD-10-CM | POA: Diagnosis not present

## 2018-09-18 DIAGNOSIS — I69320 Aphasia following cerebral infarction: Secondary | ICD-10-CM | POA: Diagnosis not present

## 2018-09-18 DIAGNOSIS — F17219 Nicotine dependence, cigarettes, with unspecified nicotine-induced disorders: Secondary | ICD-10-CM | POA: Diagnosis not present

## 2018-09-18 DIAGNOSIS — L97812 Non-pressure chronic ulcer of other part of right lower leg with fat layer exposed: Secondary | ICD-10-CM | POA: Diagnosis not present

## 2018-09-18 DIAGNOSIS — Z9181 History of falling: Secondary | ICD-10-CM | POA: Diagnosis not present

## 2018-09-18 DIAGNOSIS — J449 Chronic obstructive pulmonary disease, unspecified: Secondary | ICD-10-CM | POA: Diagnosis not present

## 2018-09-18 NOTE — Telephone Encounter (Signed)
Called and give verbal orders

## 2018-09-18 NOTE — Telephone Encounter (Signed)
[facility]my from [facility] calling regarding this patient  Please call her at [phone]

## 2018-09-22 DIAGNOSIS — I69320 Aphasia following cerebral infarction: Secondary | ICD-10-CM | POA: Diagnosis not present

## 2018-09-22 DIAGNOSIS — L97812 Non-pressure chronic ulcer of other part of right lower leg with fat layer exposed: Secondary | ICD-10-CM | POA: Diagnosis not present

## 2018-09-22 DIAGNOSIS — J449 Chronic obstructive pulmonary disease, unspecified: Secondary | ICD-10-CM | POA: Diagnosis not present

## 2018-09-22 DIAGNOSIS — E11622 Type 2 diabetes mellitus with other skin ulcer: Secondary | ICD-10-CM | POA: Diagnosis not present

## 2018-09-22 DIAGNOSIS — Z85118 Personal history of other malignant neoplasm of bronchus and lung: Secondary | ICD-10-CM | POA: Diagnosis not present

## 2018-09-22 DIAGNOSIS — Z7982 Long term (current) use of aspirin: Secondary | ICD-10-CM | POA: Diagnosis not present

## 2018-09-22 DIAGNOSIS — I69351 Hemiplegia and hemiparesis following cerebral infarction affecting right dominant side: Secondary | ICD-10-CM | POA: Diagnosis not present

## 2018-09-22 DIAGNOSIS — Z9181 History of falling: Secondary | ICD-10-CM | POA: Diagnosis not present

## 2018-09-22 DIAGNOSIS — F17219 Nicotine dependence, cigarettes, with unspecified nicotine-induced disorders: Secondary | ICD-10-CM | POA: Diagnosis not present

## 2018-09-23 DIAGNOSIS — E11622 Type 2 diabetes mellitus with other skin ulcer: Secondary | ICD-10-CM | POA: Diagnosis not present

## 2018-09-23 DIAGNOSIS — J449 Chronic obstructive pulmonary disease, unspecified: Secondary | ICD-10-CM | POA: Diagnosis not present

## 2018-09-23 DIAGNOSIS — I69320 Aphasia following cerebral infarction: Secondary | ICD-10-CM | POA: Diagnosis not present

## 2018-09-23 DIAGNOSIS — Z7982 Long term (current) use of aspirin: Secondary | ICD-10-CM | POA: Diagnosis not present

## 2018-09-23 DIAGNOSIS — Z9181 History of falling: Secondary | ICD-10-CM | POA: Diagnosis not present

## 2018-09-23 DIAGNOSIS — I69351 Hemiplegia and hemiparesis following cerebral infarction affecting right dominant side: Secondary | ICD-10-CM | POA: Diagnosis not present

## 2018-09-23 DIAGNOSIS — L97812 Non-pressure chronic ulcer of other part of right lower leg with fat layer exposed: Secondary | ICD-10-CM | POA: Diagnosis not present

## 2018-09-23 DIAGNOSIS — Z85118 Personal history of other malignant neoplasm of bronchus and lung: Secondary | ICD-10-CM | POA: Diagnosis not present

## 2018-09-23 DIAGNOSIS — F17219 Nicotine dependence, cigarettes, with unspecified nicotine-induced disorders: Secondary | ICD-10-CM | POA: Diagnosis not present

## 2018-09-24 ENCOUNTER — Encounter: Payer: Self-pay | Admitting: Family Medicine

## 2018-09-24 ENCOUNTER — Telehealth: Payer: Self-pay | Admitting: Family Medicine

## 2018-09-24 ENCOUNTER — Ambulatory Visit (INDEPENDENT_AMBULATORY_CARE_PROVIDER_SITE_OTHER): Payer: Medicare HMO | Admitting: Family Medicine

## 2018-09-24 VITALS — BP 142/88 | HR 64 | Temp 98.2°F | Resp 18 | Ht 70.75 in | Wt 251.0 lb

## 2018-09-24 DIAGNOSIS — E11622 Type 2 diabetes mellitus with other skin ulcer: Secondary | ICD-10-CM | POA: Diagnosis not present

## 2018-09-24 DIAGNOSIS — F17219 Nicotine dependence, cigarettes, with unspecified nicotine-induced disorders: Secondary | ICD-10-CM | POA: Diagnosis not present

## 2018-09-24 DIAGNOSIS — G4710 Hypersomnia, unspecified: Secondary | ICD-10-CM | POA: Diagnosis not present

## 2018-09-24 DIAGNOSIS — Z85118 Personal history of other malignant neoplasm of bronchus and lung: Secondary | ICD-10-CM | POA: Diagnosis not present

## 2018-09-24 DIAGNOSIS — J209 Acute bronchitis, unspecified: Secondary | ICD-10-CM | POA: Diagnosis not present

## 2018-09-24 DIAGNOSIS — J440 Chronic obstructive pulmonary disease with acute lower respiratory infection: Secondary | ICD-10-CM

## 2018-09-24 DIAGNOSIS — J449 Chronic obstructive pulmonary disease, unspecified: Secondary | ICD-10-CM | POA: Diagnosis not present

## 2018-09-24 DIAGNOSIS — L97812 Non-pressure chronic ulcer of other part of right lower leg with fat layer exposed: Secondary | ICD-10-CM | POA: Diagnosis not present

## 2018-09-24 DIAGNOSIS — G253 Myoclonus: Secondary | ICD-10-CM

## 2018-09-24 DIAGNOSIS — I69351 Hemiplegia and hemiparesis following cerebral infarction affecting right dominant side: Secondary | ICD-10-CM | POA: Diagnosis not present

## 2018-09-24 DIAGNOSIS — I69320 Aphasia following cerebral infarction: Secondary | ICD-10-CM | POA: Diagnosis not present

## 2018-09-24 DIAGNOSIS — Z7982 Long term (current) use of aspirin: Secondary | ICD-10-CM | POA: Diagnosis not present

## 2018-09-24 DIAGNOSIS — Z9181 History of falling: Secondary | ICD-10-CM | POA: Diagnosis not present

## 2018-09-24 MED ORDER — FLUTICASONE-UMECLIDIN-VILANT 100-62.5-25 MCG/INH IN AEPB: 1.0000 | INHALATION_SPRAY | Freq: Every day | RESPIRATORY_TRACT | 11 refills | Status: AC

## 2018-09-24 NOTE — Telephone Encounter (Signed)
Brad Taylor from [facility] calling regarding this patient not having a bowel movement for 10 days, and several things have been tried, please call her at [phone]

## 2018-09-24 NOTE — Telephone Encounter (Signed)
Has appt today  - will discuss at that time.

## 2018-09-24 NOTE — Progress Notes (Signed)
Subjective:    Patient ID: Brad Taylor, male    DOB: 02/07/1951, 68 y.o.   MRN: [id number]    Admit date: 09/12/2018 Discharge date: 09/15/2018  Admitted From: home Disposition:  home   Recommendations for Outpatient Follow-up:  1. F/u pulse ox in 1 wk to see if it has improved- currently being discharged with O2  Discharge Condition:  stable   CODE STATUS:  Full code   Consultations:  neurology    Discharge Diagnoses:  Principal Problem:   COPD with acute bronchitis/   COPD exacerbation Active Problems:   Myoclonic jerking   Stroke ([place])   Aphasia as late effect of stroke   Hemiplegia and hemiparesis following unspecified cerebrovascular disease affecting unspecified side (HCC)   Hypothyroidism   Chronic diastolic CHF (congestive heart failure) ([place])   H/O: lung cancer   Leg wound, right    Brief Summary: Brad Taylor a 68 y.o.malewith AAA, COPD, chronic pain problems on narcotics, depression, PTSD, diabetes type 2 on insulin, hyperlipidemia, hypothyroidism, stroke with right-sided hemiparesis, right lower leg chronic wound who presents to the emergency room with recently worsening shortness of breath, lethargy, falls and unintentional movements of the arms and legs.Also noted in ED was that he was hypoxic with pulse ox of 88% with dyspnea.  Hospital Course:  Principal Problem: COPD with acute bronchitis - On bronchodilators and O2 as needed- he received 1 dose of Solumedrol in the ED but not continued- no subsequent wheezing on my exams - pulse ox drops to 87% on exertion- he will go home with continuous O2  Active Problems:  Myoclonic jerking -  neuro eval requested - EEG was negative for seizures- he has not had any further jerking in the hospital which makes me suspect that it may have been related to hypoxia- I did explain this to the patient and his wife  Aphasia as late effect of stroke Hemiplegia and hemiparesis following  unspecified cerebrovascular disease affecting unspecified side  - cont Lipitor  DM 2 - ont Lantus and Novolog  Hypothyroidism - Synthroid  H/O: lung cancer  Leg wound, right - cont dressing changes  Chronic pain - on MS contin, Neurontin  09/24/18 Patient is here today for follow-up.  He states that his breathing is doing well.  He denies feeling short of breath.  However the home health nurse called [place] earlier this week stating the patient was audibly wheezing at home.  Today on exam, he is sitting comfortably on the exam table.  He is in no respiratory distress.  There is no increased work of breathing.  There is no accessory muscle use.  There is no tachypnea.  Pulse oximetry is 95% on room air.  However the patient does have faint scattered expiratory wheezes in all 4 lung fields.  He is consistently taking his Stiolto however he was recently admitted with a COPD exacerbation.  He has had no further episodes of myoclonic jerks in his left upper extremity.  EEG in the hospital was negative for seizure activity.  It was assumed that hypoxia may be causing the myoclonic jerks.  What is concerning to me is that even today, the patient is sitting in the exam room visibly nodding off in the middle of our conversation.  His head will jerk and his eyes were closed and he will awaken himself.  This occurred at least 3 or 4 times during our 15-minute encounter.  I have discussed this with the patient and his  wife before.  I am concerned that he is taking too much medication and is making him hypersomnolent and causing him to sleep entirely too much and that this also could be suppressing his respiratory drive leading to hypoxia and possibly even causing some of the myoclonic jerks.  He gets his pain medication through his primary care physician at the [place].  He has an appointment in early March and I have recommended that they discussed decreasing his pain medication at that time due to his  hypersomnolence.  He also has untreated sleep apnea which is contributing to this as well.  I believe both of these issues are contributing to his hypoxia which indirectly could be causing some of the myoclonic jerks. Past Medical History:  Diagnosis Date  . AAA (abdominal aortic aneurysm) ([place]) 5/15   3.5x3.5 cm  . COPD (chronic obstructive pulmonary disease) ([place])   . Depression   . Diabetes mellitus without complication ([place])   . GERD (gastroesophageal reflux disease)   . Hyperlipidemia   . Hypothyroidism   . Post traumatic stress disorder   . Stroke ([place])   . Thyroid disease   . Ulcer    Past Surgical History:  Procedure Laterality Date  . ABDOMINAL AORTOGRAM W/LOWER EXTREMITY N/A 08/20/2017   Procedure: ABDOMINAL AORTOGRAM W/LOWER EXTREMITY;  Surgeon: Waynetta Sandy, MD;  Location: [place] [facility];  Service: Cardiovascular;  Laterality: N/A;  . CATARACT EXTRACTION    . CHOLECYSTECTOMY    . ENDOVENOUS ABLATION SAPHENOUS VEIN W/ LASER Right 03/18/2018   endovenous laser ablation R GSV by Ruta Hinds MD   . ENDOVENOUS ABLATION SAPHENOUS VEIN W/ LASER Right 04/08/2018   endovenous laser ablation R SSV by Ruta Hinds MD   . [place]    . LUNG REMOVAL, PARTIAL     right, 2 years ago at [facility] for cancer  . PERIPHERAL VASCULAR ATHERECTOMY Right 08/20/2017   Procedure: PERIPHERAL VASCULAR ATHERECTOMY;  Surgeon: Waynetta Sandy, MD;  Location: [place] [facility];  Service: Cardiovascular;  Laterality: Right;  posterior tibial and tibeoperoneal trunk  . PERIPHERAL VASCULAR BALLOON ANGIOPLASTY Right 08/20/2017   Procedure: PERIPHERAL VASCULAR BALLOON ANGIOPLASTY;  Surgeon: Waynetta Sandy, MD;  Location: [place] [facility];  Service: Cardiovascular;  Laterality: Right;  Anterior tibial  . VIDEO BRONCHOSCOPY Bilateral 12/30/2017   Procedure: VIDEO BRONCHOSCOPY WITHOUT FLUORO;  Surgeon: Marshell Garfinkel, MD;  Location: [place] [facility];  Service:  Cardiopulmonary;  Laterality: Bilateral;   Current Outpatient Medications on File Prior to Visit  Medication Sig Dispense Refill  . albuterol (PROVENTIL HFA;VENTOLIN HFA) 108 (90 Base) MCG/ACT inhaler INHALE 2 PUFFS INTO THE LUNGS EVERY 6 HOURS AS NEEDED FOR WHEEZE OR SHORTNESS OF BREATH 18 g 3  . atorvastatin (LIPITOR) 10 MG tablet Take 10 mg by mouth every other day.     . busPIRone (BUSPAR) 10 MG tablet Take 0.5 tablets (5 mg total) by mouth 2 (two) times daily as needed (for anxiety). (Patient taking differently: Take 10 mg by mouth 2 (two) times daily. ) 60 tablet 0  . clopidogrel (PLAVIX) 75 MG tablet Take 1 tablet (75 mg total) by mouth daily with breakfast. 90 tablet 3  . gabapentin (NEURONTIN) 300 MG capsule Take 1 capsule (300 mg total) by mouth 3 (three) times daily. (Patient taking differently: Take 300 mg by mouth 2 (two) times daily. ) 90 capsule 3  . insulin glargine (LANTUS) 100 UNIT/ML injection Inject 45-55 Units into the skin 2 (two) times daily. Take  45 units in the morning and 55 units at night    . levothyroxine (SYNTHROID, LEVOTHROID) 125 MCG tablet Take 1 tablet (125 mcg total) by mouth daily before breakfast. 30 tablet 1  . metFORMIN (GLUCOPHAGE) 500 MG tablet Take 500 mg by mouth 2 (two) times daily with a meal.    . morphine (MS CONTIN) 100 MG 12 hr tablet Take 100 mg by mouth every 12 (twelve) hours as needed for pain.     Marland Kitchen MOVANTIK 25 MG TABS tablet TAKE 1 TABLET BY MOUTH EVERY DAY 30 tablet 5  . oxymetazoline (AFRIN) 0.05 % nasal spray Place 2 sprays into both nostrils 2 (two) times daily as needed for congestion.    . RABEprazole (ACIPHEX) 20 MG tablet Take 2 tablets (40 mg total) by mouth daily before breakfast. 30 tablet 1  . tamsulosin (FLOMAX) 0.4 MG CAPS capsule Take 0.4 mg by mouth at bedtime.     . Tiotropium Bromide-Olodaterol (STIOLTO RESPIMAT) 2.5-2.5 MCG/ACT AERS Inhale 2 Inhalers into the lungs daily. 12 g 3  . traZODone (DESYREL) 100 MG tablet Take 3  tablets (300 mg total) by mouth at bedtime. 30 tablet 0  . venlafaxine XR (EFFEXOR-XR) 150 MG 24 hr capsule Take 1 capsule (150 mg total) by mouth daily with breakfast. (Patient taking differently: Take 150 mg by mouth 2 (two) times daily. ) 30 capsule 1   No current facility-administered medications on file prior to visit.    No Known Allergies Social History   Socioeconomic History  . Marital status: Married    Spouse name: Diane  . Number of children: 1  . Years of education: HS  . Highest education level: Not on file  Occupational History  . Occupation: Retired  Scientific laboratory technician  . Financial resource strain: Not on file  . Food insecurity:    Worry: Not on file    Inability: Not on file  . Transportation needs:    Medical: Not on file    Non-medical: Not on file  Tobacco Use  . Smoking status: Current Every Day Smoker    Packs/day: 1.00    Years: 30.00    Pack years: 30.00    Types: Cigarettes  . Smokeless tobacco: Former Systems developer    Quit date: 05/04/2013  . Tobacco comment: Less than 1/2 pk per day  Substance and Sexual Activity  . Alcohol use: No    Comment: Former heavy drinker, been sober for 10 years  . Drug use: No  . Sexual activity: Not on file  Lifestyle  . Physical activity:    Days per week: Not on file    Minutes per session: Not on file  . Stress: Not on file  Relationships  . Social connections:    Talks on phone: Not on file    Gets together: Not on file    Attends religious service: Not on file    Active member of club or organization: Not on file    Attends meetings of clubs or organizations: Not on file    Relationship status: Not on file  . Intimate partner violence:    Fear of current or ex partner: Not on file    Emotionally abused: Not on file    Physically abused: Not on file    Forced sexual activity: Not on file  Other Topics Concern  . Not on file  Social History Narrative   Patient lives at home with spouse.   Caffeine Use: 3-4 cups  daily  Retired Dealer.      Review of Systems  Gastrointestinal: Positive for constipation.  All other systems reviewed and are negative.      Objective:   Physical Exam  Constitutional: He appears well-developed and well-nourished. No distress.  HENT:  Right Ear: External ear normal.  Left Ear: External ear normal.  Nose: Nose normal.  Mouth/Throat: Oropharynx is clear and moist. No oropharyngeal exudate.  Eyes: Conjunctivae are normal.  Neck: Neck supple.  Cardiovascular: Normal rate, regular rhythm and normal heart sounds.  No murmur heard. Pulmonary/Chest: Effort normal. No accessory muscle usage. He has no decreased breath sounds. He has wheezes in the right upper field, the right middle field, the right lower field, the left upper field, the left middle field and the left lower field. He has no rhonchi. He has no rales.  Skin: Lesion noted. He is not diaphoretic.  Vitals reviewed.         Assessment & Plan:   COPD with acute bronchitis ([place])  Hypersomnolence  Myoclonic jerking  I have recommended discontinuing his Stiolto and replacing it with Trelegy 1 inhalation a day to maximize therapy for his COPD in an effort to help prevent future exacerbations and reduce his episodes of hypoxia.  I congratulated the patient on his smoking cessation.  I believe his hypersomnolence is secondary to untreated sleep apnea and also polypharmacy and opiate use.  I have recommended that he discuss this with his primary care physician at the [facility] and discussed possibly reducing his dose of pain medication.  He nods off several times during our encounter but quickly catches himself.  I discussed this with his wife as well.  They both state that they understand and are willing to discuss with his doctor decreasing his dose of medication.  I also believe that if we treated his sleep apnea, this would also improve his hypoxia and may decrease the episodes of myoclonic jerking.  Therefore I  recommended treating sleep apnea however the patient is unable to tolerate CPAP due to severe claustrophobia.  Therefore I believe we need to focus on his COPD and reducing his opiate use.

## 2018-09-25 DIAGNOSIS — Z9181 History of falling: Secondary | ICD-10-CM | POA: Diagnosis not present

## 2018-09-25 DIAGNOSIS — I69351 Hemiplegia and hemiparesis following cerebral infarction affecting right dominant side: Secondary | ICD-10-CM | POA: Diagnosis not present

## 2018-09-25 DIAGNOSIS — J449 Chronic obstructive pulmonary disease, unspecified: Secondary | ICD-10-CM | POA: Diagnosis not present

## 2018-09-25 DIAGNOSIS — I69320 Aphasia following cerebral infarction: Secondary | ICD-10-CM | POA: Diagnosis not present

## 2018-09-25 DIAGNOSIS — Z85118 Personal history of other malignant neoplasm of bronchus and lung: Secondary | ICD-10-CM | POA: Diagnosis not present

## 2018-09-25 DIAGNOSIS — F17219 Nicotine dependence, cigarettes, with unspecified nicotine-induced disorders: Secondary | ICD-10-CM | POA: Diagnosis not present

## 2018-09-25 DIAGNOSIS — E11622 Type 2 diabetes mellitus with other skin ulcer: Secondary | ICD-10-CM | POA: Diagnosis not present

## 2018-09-25 DIAGNOSIS — L97812 Non-pressure chronic ulcer of other part of right lower leg with fat layer exposed: Secondary | ICD-10-CM | POA: Diagnosis not present

## 2018-09-25 DIAGNOSIS — Z7982 Long term (current) use of aspirin: Secondary | ICD-10-CM | POA: Diagnosis not present

## 2018-09-28 DIAGNOSIS — I69320 Aphasia following cerebral infarction: Secondary | ICD-10-CM | POA: Diagnosis not present

## 2018-09-28 DIAGNOSIS — J449 Chronic obstructive pulmonary disease, unspecified: Secondary | ICD-10-CM | POA: Diagnosis not present

## 2018-09-28 DIAGNOSIS — Z9181 History of falling: Secondary | ICD-10-CM | POA: Diagnosis not present

## 2018-09-28 DIAGNOSIS — L97812 Non-pressure chronic ulcer of other part of right lower leg with fat layer exposed: Secondary | ICD-10-CM | POA: Diagnosis not present

## 2018-09-28 DIAGNOSIS — I69351 Hemiplegia and hemiparesis following cerebral infarction affecting right dominant side: Secondary | ICD-10-CM | POA: Diagnosis not present

## 2018-09-28 DIAGNOSIS — F17219 Nicotine dependence, cigarettes, with unspecified nicotine-induced disorders: Secondary | ICD-10-CM | POA: Diagnosis not present

## 2018-09-28 DIAGNOSIS — E11622 Type 2 diabetes mellitus with other skin ulcer: Secondary | ICD-10-CM | POA: Diagnosis not present

## 2018-09-28 DIAGNOSIS — Z85118 Personal history of other malignant neoplasm of bronchus and lung: Secondary | ICD-10-CM | POA: Diagnosis not present

## 2018-09-28 DIAGNOSIS — Z7982 Long term (current) use of aspirin: Secondary | ICD-10-CM | POA: Diagnosis not present

## 2018-09-29 DIAGNOSIS — E11622 Type 2 diabetes mellitus with other skin ulcer: Secondary | ICD-10-CM | POA: Diagnosis not present

## 2018-09-29 DIAGNOSIS — L97812 Non-pressure chronic ulcer of other part of right lower leg with fat layer exposed: Secondary | ICD-10-CM | POA: Diagnosis not present

## 2018-09-29 DIAGNOSIS — Z85118 Personal history of other malignant neoplasm of bronchus and lung: Secondary | ICD-10-CM | POA: Diagnosis not present

## 2018-09-29 DIAGNOSIS — Z9181 History of falling: Secondary | ICD-10-CM | POA: Diagnosis not present

## 2018-09-29 DIAGNOSIS — I69351 Hemiplegia and hemiparesis following cerebral infarction affecting right dominant side: Secondary | ICD-10-CM | POA: Diagnosis not present

## 2018-09-29 DIAGNOSIS — Z7982 Long term (current) use of aspirin: Secondary | ICD-10-CM | POA: Diagnosis not present

## 2018-09-29 DIAGNOSIS — F17219 Nicotine dependence, cigarettes, with unspecified nicotine-induced disorders: Secondary | ICD-10-CM | POA: Diagnosis not present

## 2018-09-29 DIAGNOSIS — J449 Chronic obstructive pulmonary disease, unspecified: Secondary | ICD-10-CM | POA: Diagnosis not present

## 2018-09-29 DIAGNOSIS — I69320 Aphasia following cerebral infarction: Secondary | ICD-10-CM | POA: Diagnosis not present

## 2018-09-30 DIAGNOSIS — I69320 Aphasia following cerebral infarction: Secondary | ICD-10-CM | POA: Diagnosis not present

## 2018-09-30 DIAGNOSIS — Z7982 Long term (current) use of aspirin: Secondary | ICD-10-CM | POA: Diagnosis not present

## 2018-09-30 DIAGNOSIS — F17219 Nicotine dependence, cigarettes, with unspecified nicotine-induced disorders: Secondary | ICD-10-CM | POA: Diagnosis not present

## 2018-09-30 DIAGNOSIS — Z85118 Personal history of other malignant neoplasm of bronchus and lung: Secondary | ICD-10-CM | POA: Diagnosis not present

## 2018-09-30 DIAGNOSIS — J449 Chronic obstructive pulmonary disease, unspecified: Secondary | ICD-10-CM | POA: Diagnosis not present

## 2018-09-30 DIAGNOSIS — E11622 Type 2 diabetes mellitus with other skin ulcer: Secondary | ICD-10-CM | POA: Diagnosis not present

## 2018-09-30 DIAGNOSIS — L97812 Non-pressure chronic ulcer of other part of right lower leg with fat layer exposed: Secondary | ICD-10-CM | POA: Diagnosis not present

## 2018-09-30 DIAGNOSIS — I69351 Hemiplegia and hemiparesis following cerebral infarction affecting right dominant side: Secondary | ICD-10-CM | POA: Diagnosis not present

## 2018-09-30 DIAGNOSIS — Z9181 History of falling: Secondary | ICD-10-CM | POA: Diagnosis not present

## 2018-10-01 ENCOUNTER — Other Ambulatory Visit: Payer: Self-pay

## 2018-10-01 ENCOUNTER — Ambulatory Visit: Payer: Medicare HMO | Admitting: Neurology

## 2018-10-01 ENCOUNTER — Encounter: Payer: Self-pay | Admitting: Neurology

## 2018-10-01 VITALS — BP 116/68 | HR 64 | Resp 18 | Ht 70.75 in | Wt 246.5 lb

## 2018-10-01 DIAGNOSIS — R253 Fasciculation: Secondary | ICD-10-CM

## 2018-10-01 DIAGNOSIS — Z8673 Personal history of transient ischemic attack (TIA), and cerebral infarction without residual deficits: Secondary | ICD-10-CM | POA: Diagnosis not present

## 2018-10-01 DIAGNOSIS — I69359 Hemiplegia and hemiparesis following cerebral infarction affecting unspecified side: Secondary | ICD-10-CM | POA: Diagnosis not present

## 2018-10-01 DIAGNOSIS — E11622 Type 2 diabetes mellitus with other skin ulcer: Secondary | ICD-10-CM | POA: Diagnosis not present

## 2018-10-01 DIAGNOSIS — J449 Chronic obstructive pulmonary disease, unspecified: Secondary | ICD-10-CM | POA: Diagnosis not present

## 2018-10-01 DIAGNOSIS — F17210 Nicotine dependence, cigarettes, uncomplicated: Secondary | ICD-10-CM | POA: Diagnosis not present

## 2018-10-01 DIAGNOSIS — E11621 Type 2 diabetes mellitus with foot ulcer: Secondary | ICD-10-CM | POA: Diagnosis not present

## 2018-10-01 DIAGNOSIS — L97512 Non-pressure chronic ulcer of other part of right foot with fat layer exposed: Secondary | ICD-10-CM | POA: Diagnosis not present

## 2018-10-01 DIAGNOSIS — L97812 Non-pressure chronic ulcer of other part of right lower leg with fat layer exposed: Secondary | ICD-10-CM | POA: Diagnosis not present

## 2018-10-01 DIAGNOSIS — I69320 Aphasia following cerebral infarction: Secondary | ICD-10-CM | POA: Diagnosis not present

## 2018-10-01 NOTE — Progress Notes (Signed)
PATIENT: Brad Taylor DOB: 12-14-1950  Chief Complaint  Patient presents with  . Seizures    Rm. 13.  Intermittent tremors bilat hands, arms onset around 09/08/18. Was hospitalized 09/12/18, referred here for r/o sz. Per wife, it was felt pt's O2 level may be dropping during sleep, causing episodes.  Pt. has not had an episode since he was hospitalized/fim  . PCP    Jenna Luo     HISTORICAL  Brad Taylor is our 68 year old male,, seen in request by his primary care physician Jenna Luo for evaluation of seizure, initial evaluation was on October 01, 2018.  He is accompanied by his wife at today's clinical visit.  I have reviewed and summarized the referring note from the referring physician in hospital discharge summary on September 14, 2018.  He had a past medical history of aortic aneurysm, COPD, 1.5 L night home oxygen dependent since hospital discharge, chronic pain, on narcotic treatment, depression, PTSD, type 2 diabetes, insulin-dependent x 10 years, hyperlipidemia, hypothyroidism, stroke in 2017 with residual right hemiparesis, right lower leg chronic wound,   He was admitted on September 12, 2018 for worsening breathing difficulty at home, there was also involuntary movement of arms and legs, he was diagnosed with COPD with acute bronchitis, oxygen saturation dropped to 87%, treated with steroid, was discharged home with continuous home oxygen, since hospital discharge, he did not have a recurrent jerking episode,  This started on September 08, 2018, wife reported sudden onset right and left arm jerking movement, lasting for few seconds, drop things from his hand, only lasting for few seconds, without loss of consciousness, initially just once a day, before hospital admission, he was having multiple episode in the day, but had no recurrent episode since his oxygen level improved.  In addition, he is on polypharmacy treatment, including trazodone, gabapentin, BuSpar,  morphine,    EEG September 13, 2018 was normal  Personally reviewed MRI of the brain on September 05, 2018, no acute abnormality, advanced periventricular small vessel disease most prominent in the left hemisphere with left brainstem wallerian degeneration, chronic cortical encephalomalacia in the left mesial temporal lobe,  REVIEW OF SYSTEMS: Full 14 system review of systems performed and notable only for shortness of breath, snoring, slurred speech, seizure, murmur All other review of systems were negative.  ALLERGIES: No Known Allergies  HOME MEDICATIONS: Current Outpatient Medications  Medication Sig Dispense Refill  . albuterol (PROVENTIL HFA;VENTOLIN HFA) 108 (90 Base) MCG/ACT inhaler INHALE 2 PUFFS INTO THE LUNGS EVERY 6 HOURS AS NEEDED FOR WHEEZE OR SHORTNESS OF BREATH 18 g 3  . atorvastatin (LIPITOR) 10 MG tablet Take 10 mg by mouth every other day.     . busPIRone (BUSPAR) 10 MG tablet Take 0.5 tablets (5 mg total) by mouth 2 (two) times daily as needed (for anxiety). (Patient taking differently: Take 10 mg by mouth 2 (two) times daily. ) 60 tablet 0  . clopidogrel (PLAVIX) 75 MG tablet Take 1 tablet (75 mg total) by mouth daily with breakfast. 90 tablet 3  . Fluticasone-Umeclidin-Vilant (TRELEGY ELLIPTA) 100-62.5-25 MCG/INH AEPB Inhale 1 Inhaler into the lungs daily. 1 each 11  . gabapentin (NEURONTIN) 300 MG capsule Take 1 capsule (300 mg total) by mouth 3 (three) times daily. (Patient taking differently: Take 300 mg by mouth 2 (two) times daily. ) 90 capsule 3  . insulin glargine (LANTUS) 100 UNIT/ML injection Inject 45-55 Units into the skin 2 (two) times daily. Take 45 units in the  morning and 55 units at night    . levothyroxine (SYNTHROID, LEVOTHROID) 125 MCG tablet Take 1 tablet (125 mcg total) by mouth daily before breakfast. 30 tablet 1  . metFORMIN (GLUCOPHAGE) 500 MG tablet Take 500 mg by mouth 2 (two) times daily with a meal.    . morphine (MS CONTIN) 100 MG 12 hr tablet  Take 100 mg by mouth every 12 (twelve) hours as needed for pain.     Marland Kitchen MOVANTIK 25 MG TABS tablet TAKE 1 TABLET BY MOUTH EVERY DAY 30 tablet 5  . oxymetazoline (AFRIN) 0.05 % nasal spray Place 2 sprays into both nostrils 2 (two) times daily as needed for congestion.    . RABEprazole (ACIPHEX) 20 MG tablet Take 2 tablets (40 mg total) by mouth daily before breakfast. 30 tablet 1  . tamsulosin (FLOMAX) 0.4 MG CAPS capsule Take 0.4 mg by mouth at bedtime.     . Tiotropium Bromide-Olodaterol (STIOLTO RESPIMAT) 2.5-2.5 MCG/ACT AERS Inhale 2 Inhalers into the lungs daily. 12 g 3  . traZODone (DESYREL) 100 MG tablet Take 3 tablets (300 mg total) by mouth at bedtime. 30 tablet 0  . venlafaxine XR (EFFEXOR-XR) 150 MG 24 hr capsule Take 1 capsule (150 mg total) by mouth daily with breakfast. (Patient taking differently: Take 150 mg by mouth 2 (two) times daily. ) 30 capsule 1   No current facility-administered medications for this visit.     PAST MEDICAL HISTORY: Past Medical History:  Diagnosis Date  . AAA (abdominal aortic aneurysm) ([place]) 5/15   3.5x3.5 cm  . COPD (chronic obstructive pulmonary disease) ([place])   . Depression   . Diabetes mellitus without complication ([place])   . GERD (gastroesophageal reflux disease)   . Hyperlipidemia   . Hypothyroidism   . Post traumatic stress disorder   . Stroke ([place])   . Thyroid disease   . Ulcer     PAST SURGICAL HISTORY: Past Surgical History:  Procedure Laterality Date  . ABDOMINAL AORTOGRAM W/LOWER EXTREMITY N/A 08/20/2017   Procedure: ABDOMINAL AORTOGRAM W/LOWER EXTREMITY;  Surgeon: Waynetta Sandy, MD;  Location: [place] [facility];  Service: Cardiovascular;  Laterality: N/A;  . CATARACT EXTRACTION    . CHOLECYSTECTOMY    . ENDOVENOUS ABLATION SAPHENOUS VEIN W/ LASER Right 03/18/2018   endovenous laser ablation R GSV by Ruta Hinds MD   . ENDOVENOUS ABLATION SAPHENOUS VEIN W/ LASER Right 04/08/2018   endovenous laser ablation R SSV  by Ruta Hinds MD   . [place]    . LUNG REMOVAL, PARTIAL     right, 2 years ago at [facility] for cancer  . PERIPHERAL VASCULAR ATHERECTOMY Right 08/20/2017   Procedure: PERIPHERAL VASCULAR ATHERECTOMY;  Surgeon: Waynetta Sandy, MD;  Location: [place] [facility];  Service: Cardiovascular;  Laterality: Right;  posterior tibial and tibeoperoneal trunk  . PERIPHERAL VASCULAR BALLOON ANGIOPLASTY Right 08/20/2017   Procedure: PERIPHERAL VASCULAR BALLOON ANGIOPLASTY;  Surgeon: Waynetta Sandy, MD;  Location: [place] [facility];  Service: Cardiovascular;  Laterality: Right;  Anterior tibial  . VIDEO BRONCHOSCOPY Bilateral 12/30/2017   Procedure: VIDEO BRONCHOSCOPY WITHOUT FLUORO;  Surgeon: Marshell Garfinkel, MD;  Location: [place] [facility];  Service: Cardiopulmonary;  Laterality: Bilateral;    FAMILY HISTORY: Family History  Problem Relation Age of Onset  . Emphysema Father   . Thyroid disease Sister   . Stroke Maternal Grandmother   . Thyroid disease Sister   . Thyroid disease Sister     SOCIAL HISTORY: Social History  Socioeconomic History  . Marital status: Married    Spouse name: Diane  . Number of children: 1  . Years of education: HS  . Highest education level: Not on file  Occupational History  . Occupation: Retired Dealer    Comment: [employer]  Social Needs  . Financial resource strain: Not on file  . Food insecurity:    Worry: Not on file    Inability: Not on file  . Transportation needs:    Medical: Not on file    Non-medical: Not on file  Tobacco Use  . Smoking status: Current Every Day Smoker    Packs/day: 1.00    Years: 30.00    Pack years: 30.00    Types: Cigarettes  . Smokeless tobacco: Former Systems developer    Quit date: 05/04/2013  . Tobacco comment: Less than 1/2 pk per day  Substance and Sexual Activity  . Alcohol use: No    Comment: Former heavy drinker, been sober for 10 years  . Drug use: No  . Sexual activity: Not on file  Lifestyle  .  Physical activity:    Days per week: Not on file    Minutes per session: Not on file  . Stress: Not on file  Relationships  . Social connections:    Talks on phone: Not on file    Gets together: Not on file    Attends religious service: Not on file    Active member of club or organization: Not on file    Attends meetings of clubs or organizations: Not on file    Relationship status: Not on file  . Intimate partner violence:    Fear of current or ex partner: Not on file    Emotionally abused: Not on file    Physically abused: Not on file    Forced sexual activity: Not on file  Other Topics Concern  . Not on file  Social History Narrative   Patient lives at home with spouse.   Caffeine Use: 3-4 cups daily   Retired Dealer.     PHYSICAL EXAM   Vitals:   10/01/18 1410  BP: 116/68  Pulse: 64  Resp: 18  Weight: 246 lb 8 oz (111.8 kg)  Height: 5' 10.75" (1.797 m)    Not recorded      Body mass index is 34.62 kg/m.  PHYSICAL EXAMNIATION:  Gen: NAD, conversant, well nourised, obese, well groomed                     Cardiovascular: Regular rate rhythm, no peripheral edema, warm, nontender. Eyes: Conjunctivae clear without exudates or hemorrhage Neck: Supple, no carotid bruits. Pulmonary: Clear to auscultation bilaterally   NEUROLOGICAL EXAM:  MENTAL STATUS: Speech:    Speech is normal; fluent and spontaneous with normal comprehension.  Cognition:     Orientation to time, place and person     Normal recent and remote memory     Normal Attention span and concentration     Normal Language, naming, repeating,spontaneous speech     Fund of knowledge   CRANIAL NERVES: CN II: Visual fields are full to confrontation.Pupils are small round equal and briskly reactive to light. CN III, IV, VI: extraocular movement are normal. No ptosis. CN V: Facial sensation is intact to pinprick in all 3 divisions bilaterally. Corneal responses are intact.  CN VII: Face is symmetric  with normal eye closure and smile. CN VIII: Hearing is normal to rubbing fingers CN IX, X: Palate  elevates symmetrically. Phonation is normal. CN XI: Head turning and shoulder shrug are intact CN XII: Tongue is midline with normal movements and no atrophy.  MOTOR: Spastic right hemiparesis, fixation of right upper extremity and rapid rotating movement, mild right leg drift, spasticity, nonhealing wound at the right lower extremity,  REFLEXES: Hyperreflexia at the right arm, leg, absent ankle reflexes, plantar responses are flexor,  SENSORY: Length dependent decreased light touch, vibratory sensation to mid shin level  COORDINATION: Rapid alternating movements and fine finger movements are intact. There is no dysmetria on finger-to-nose and heel-knee-shin.    GAIT/STANCE: He needs pushed up to get up from seated position, mild spastic right hemi-circumferential gait, unsteady,  DIAGNOSTIC DATA (LABS, IMAGING, TESTING) - I reviewed patient records, labs, notes, testing and imaging myself where available.   ASSESSMENT AND PLAN  TOBE KERVIN is a 68 y.o. male   History of stroke with residual spastic right hemiparesis   Recurrent jerking movement,  Less likely represent seizure, most likely myoclonic jerking due to hypoxia with polypharmacy treatment,  Has much improved with his treatment of COPD, bronchitis, improvement of SPO2 level,  Continue observe his symptoms, only return to clinic for new issues   Marcial Pacas, M.D. Ph.D.  [facility] [address] [place], [place] [address] Ph: [phone] Fax: [phone]  CC: [name], MD

## 2018-10-05 DIAGNOSIS — E11622 Type 2 diabetes mellitus with other skin ulcer: Secondary | ICD-10-CM | POA: Diagnosis not present

## 2018-10-05 DIAGNOSIS — I69320 Aphasia following cerebral infarction: Secondary | ICD-10-CM | POA: Diagnosis not present

## 2018-10-05 DIAGNOSIS — L97812 Non-pressure chronic ulcer of other part of right lower leg with fat layer exposed: Secondary | ICD-10-CM | POA: Diagnosis not present

## 2018-10-05 DIAGNOSIS — J449 Chronic obstructive pulmonary disease, unspecified: Secondary | ICD-10-CM | POA: Diagnosis not present

## 2018-10-05 DIAGNOSIS — I69351 Hemiplegia and hemiparesis following cerebral infarction affecting right dominant side: Secondary | ICD-10-CM | POA: Diagnosis not present

## 2018-10-05 DIAGNOSIS — Z9181 History of falling: Secondary | ICD-10-CM | POA: Diagnosis not present

## 2018-10-05 DIAGNOSIS — Z85118 Personal history of other malignant neoplasm of bronchus and lung: Secondary | ICD-10-CM | POA: Diagnosis not present

## 2018-10-05 DIAGNOSIS — Z7982 Long term (current) use of aspirin: Secondary | ICD-10-CM | POA: Diagnosis not present

## 2018-10-05 DIAGNOSIS — F17219 Nicotine dependence, cigarettes, with unspecified nicotine-induced disorders: Secondary | ICD-10-CM | POA: Diagnosis not present

## 2018-10-07 DIAGNOSIS — L97812 Non-pressure chronic ulcer of other part of right lower leg with fat layer exposed: Secondary | ICD-10-CM | POA: Diagnosis not present

## 2018-10-07 DIAGNOSIS — I69320 Aphasia following cerebral infarction: Secondary | ICD-10-CM | POA: Diagnosis not present

## 2018-10-07 DIAGNOSIS — E11622 Type 2 diabetes mellitus with other skin ulcer: Secondary | ICD-10-CM | POA: Diagnosis not present

## 2018-10-07 DIAGNOSIS — Z85118 Personal history of other malignant neoplasm of bronchus and lung: Secondary | ICD-10-CM | POA: Diagnosis not present

## 2018-10-07 DIAGNOSIS — I69351 Hemiplegia and hemiparesis following cerebral infarction affecting right dominant side: Secondary | ICD-10-CM | POA: Diagnosis not present

## 2018-10-07 DIAGNOSIS — J449 Chronic obstructive pulmonary disease, unspecified: Secondary | ICD-10-CM | POA: Diagnosis not present

## 2018-10-07 DIAGNOSIS — Z9181 History of falling: Secondary | ICD-10-CM | POA: Diagnosis not present

## 2018-10-07 DIAGNOSIS — F17219 Nicotine dependence, cigarettes, with unspecified nicotine-induced disorders: Secondary | ICD-10-CM | POA: Diagnosis not present

## 2018-10-07 DIAGNOSIS — Z7982 Long term (current) use of aspirin: Secondary | ICD-10-CM | POA: Diagnosis not present

## 2018-10-09 DIAGNOSIS — E11622 Type 2 diabetes mellitus with other skin ulcer: Secondary | ICD-10-CM | POA: Diagnosis not present

## 2018-10-09 DIAGNOSIS — Z9181 History of falling: Secondary | ICD-10-CM | POA: Diagnosis not present

## 2018-10-09 DIAGNOSIS — Z7982 Long term (current) use of aspirin: Secondary | ICD-10-CM | POA: Diagnosis not present

## 2018-10-09 DIAGNOSIS — I69320 Aphasia following cerebral infarction: Secondary | ICD-10-CM | POA: Diagnosis not present

## 2018-10-09 DIAGNOSIS — F17219 Nicotine dependence, cigarettes, with unspecified nicotine-induced disorders: Secondary | ICD-10-CM | POA: Diagnosis not present

## 2018-10-09 DIAGNOSIS — J449 Chronic obstructive pulmonary disease, unspecified: Secondary | ICD-10-CM | POA: Diagnosis not present

## 2018-10-09 DIAGNOSIS — Z85118 Personal history of other malignant neoplasm of bronchus and lung: Secondary | ICD-10-CM | POA: Diagnosis not present

## 2018-10-09 DIAGNOSIS — I69351 Hemiplegia and hemiparesis following cerebral infarction affecting right dominant side: Secondary | ICD-10-CM | POA: Diagnosis not present

## 2018-10-09 DIAGNOSIS — L97812 Non-pressure chronic ulcer of other part of right lower leg with fat layer exposed: Secondary | ICD-10-CM | POA: Diagnosis not present

## 2018-10-12 DIAGNOSIS — F17219 Nicotine dependence, cigarettes, with unspecified nicotine-induced disorders: Secondary | ICD-10-CM | POA: Diagnosis not present

## 2018-10-12 DIAGNOSIS — E11622 Type 2 diabetes mellitus with other skin ulcer: Secondary | ICD-10-CM | POA: Diagnosis not present

## 2018-10-12 DIAGNOSIS — J449 Chronic obstructive pulmonary disease, unspecified: Secondary | ICD-10-CM | POA: Diagnosis not present

## 2018-10-12 DIAGNOSIS — I69351 Hemiplegia and hemiparesis following cerebral infarction affecting right dominant side: Secondary | ICD-10-CM | POA: Diagnosis not present

## 2018-10-12 DIAGNOSIS — L97812 Non-pressure chronic ulcer of other part of right lower leg with fat layer exposed: Secondary | ICD-10-CM | POA: Diagnosis not present

## 2018-10-12 DIAGNOSIS — I69320 Aphasia following cerebral infarction: Secondary | ICD-10-CM | POA: Diagnosis not present

## 2018-10-12 DIAGNOSIS — Z9181 History of falling: Secondary | ICD-10-CM | POA: Diagnosis not present

## 2018-10-12 DIAGNOSIS — Z85118 Personal history of other malignant neoplasm of bronchus and lung: Secondary | ICD-10-CM | POA: Diagnosis not present

## 2018-10-12 DIAGNOSIS — Z7982 Long term (current) use of aspirin: Secondary | ICD-10-CM | POA: Diagnosis not present

## 2018-10-13 DIAGNOSIS — F17219 Nicotine dependence, cigarettes, with unspecified nicotine-induced disorders: Secondary | ICD-10-CM | POA: Diagnosis not present

## 2018-10-13 DIAGNOSIS — Z7982 Long term (current) use of aspirin: Secondary | ICD-10-CM | POA: Diagnosis not present

## 2018-10-13 DIAGNOSIS — I69320 Aphasia following cerebral infarction: Secondary | ICD-10-CM | POA: Diagnosis not present

## 2018-10-13 DIAGNOSIS — L97812 Non-pressure chronic ulcer of other part of right lower leg with fat layer exposed: Secondary | ICD-10-CM | POA: Diagnosis not present

## 2018-10-13 DIAGNOSIS — I69351 Hemiplegia and hemiparesis following cerebral infarction affecting right dominant side: Secondary | ICD-10-CM | POA: Diagnosis not present

## 2018-10-13 DIAGNOSIS — Z9181 History of falling: Secondary | ICD-10-CM | POA: Diagnosis not present

## 2018-10-13 DIAGNOSIS — Z85118 Personal history of other malignant neoplasm of bronchus and lung: Secondary | ICD-10-CM | POA: Diagnosis not present

## 2018-10-13 DIAGNOSIS — J449 Chronic obstructive pulmonary disease, unspecified: Secondary | ICD-10-CM | POA: Diagnosis not present

## 2018-10-13 DIAGNOSIS — E11622 Type 2 diabetes mellitus with other skin ulcer: Secondary | ICD-10-CM | POA: Diagnosis not present

## 2018-10-14 DIAGNOSIS — J209 Acute bronchitis, unspecified: Secondary | ICD-10-CM | POA: Diagnosis not present

## 2018-10-14 DIAGNOSIS — J441 Chronic obstructive pulmonary disease with (acute) exacerbation: Secondary | ICD-10-CM | POA: Diagnosis not present

## 2018-10-14 DIAGNOSIS — J440 Chronic obstructive pulmonary disease with acute lower respiratory infection: Secondary | ICD-10-CM | POA: Diagnosis not present

## 2018-10-15 ENCOUNTER — Ambulatory Visit: Payer: Medicare HMO | Admitting: Neurology

## 2018-10-15 ENCOUNTER — Encounter (HOSPITAL_BASED_OUTPATIENT_CLINIC_OR_DEPARTMENT_OTHER): Payer: Medicare HMO | Attending: Internal Medicine

## 2018-10-15 DIAGNOSIS — E11622 Type 2 diabetes mellitus with other skin ulcer: Secondary | ICD-10-CM | POA: Insufficient documentation

## 2018-10-15 DIAGNOSIS — L97512 Non-pressure chronic ulcer of other part of right foot with fat layer exposed: Secondary | ICD-10-CM | POA: Diagnosis not present

## 2018-10-15 DIAGNOSIS — E11621 Type 2 diabetes mellitus with foot ulcer: Secondary | ICD-10-CM | POA: Diagnosis not present

## 2018-10-15 DIAGNOSIS — I69959 Hemiplegia and hemiparesis following unspecified cerebrovascular disease affecting unspecified side: Secondary | ICD-10-CM | POA: Diagnosis not present

## 2018-10-15 DIAGNOSIS — F17219 Nicotine dependence, cigarettes, with unspecified nicotine-induced disorders: Secondary | ICD-10-CM | POA: Diagnosis not present

## 2018-10-15 DIAGNOSIS — J449 Chronic obstructive pulmonary disease, unspecified: Secondary | ICD-10-CM | POA: Insufficient documentation

## 2018-10-15 DIAGNOSIS — L97812 Non-pressure chronic ulcer of other part of right lower leg with fat layer exposed: Secondary | ICD-10-CM | POA: Diagnosis not present

## 2018-10-16 DIAGNOSIS — E11622 Type 2 diabetes mellitus with other skin ulcer: Secondary | ICD-10-CM | POA: Diagnosis not present

## 2018-10-16 DIAGNOSIS — J449 Chronic obstructive pulmonary disease, unspecified: Secondary | ICD-10-CM | POA: Diagnosis not present

## 2018-10-16 DIAGNOSIS — Z85118 Personal history of other malignant neoplasm of bronchus and lung: Secondary | ICD-10-CM | POA: Diagnosis not present

## 2018-10-16 DIAGNOSIS — Z7982 Long term (current) use of aspirin: Secondary | ICD-10-CM | POA: Diagnosis not present

## 2018-10-16 DIAGNOSIS — F17219 Nicotine dependence, cigarettes, with unspecified nicotine-induced disorders: Secondary | ICD-10-CM | POA: Diagnosis not present

## 2018-10-16 DIAGNOSIS — Z9181 History of falling: Secondary | ICD-10-CM | POA: Diagnosis not present

## 2018-10-16 DIAGNOSIS — L97812 Non-pressure chronic ulcer of other part of right lower leg with fat layer exposed: Secondary | ICD-10-CM | POA: Diagnosis not present

## 2018-10-16 DIAGNOSIS — I69351 Hemiplegia and hemiparesis following cerebral infarction affecting right dominant side: Secondary | ICD-10-CM | POA: Diagnosis not present

## 2018-10-16 DIAGNOSIS — I69320 Aphasia following cerebral infarction: Secondary | ICD-10-CM | POA: Diagnosis not present

## 2018-10-19 DIAGNOSIS — J449 Chronic obstructive pulmonary disease, unspecified: Secondary | ICD-10-CM | POA: Diagnosis not present

## 2018-10-19 DIAGNOSIS — I69351 Hemiplegia and hemiparesis following cerebral infarction affecting right dominant side: Secondary | ICD-10-CM | POA: Diagnosis not present

## 2018-10-19 DIAGNOSIS — L97812 Non-pressure chronic ulcer of other part of right lower leg with fat layer exposed: Secondary | ICD-10-CM | POA: Diagnosis not present

## 2018-10-19 DIAGNOSIS — F17219 Nicotine dependence, cigarettes, with unspecified nicotine-induced disorders: Secondary | ICD-10-CM | POA: Diagnosis not present

## 2018-10-19 DIAGNOSIS — Z9181 History of falling: Secondary | ICD-10-CM | POA: Diagnosis not present

## 2018-10-19 DIAGNOSIS — Z7982 Long term (current) use of aspirin: Secondary | ICD-10-CM | POA: Diagnosis not present

## 2018-10-19 DIAGNOSIS — E11622 Type 2 diabetes mellitus with other skin ulcer: Secondary | ICD-10-CM | POA: Diagnosis not present

## 2018-10-19 DIAGNOSIS — I69320 Aphasia following cerebral infarction: Secondary | ICD-10-CM | POA: Diagnosis not present

## 2018-10-19 DIAGNOSIS — Z85118 Personal history of other malignant neoplasm of bronchus and lung: Secondary | ICD-10-CM | POA: Diagnosis not present

## 2018-10-20 DIAGNOSIS — Z85118 Personal history of other malignant neoplasm of bronchus and lung: Secondary | ICD-10-CM | POA: Diagnosis not present

## 2018-10-20 DIAGNOSIS — I69320 Aphasia following cerebral infarction: Secondary | ICD-10-CM | POA: Diagnosis not present

## 2018-10-20 DIAGNOSIS — Z7982 Long term (current) use of aspirin: Secondary | ICD-10-CM | POA: Diagnosis not present

## 2018-10-20 DIAGNOSIS — F17219 Nicotine dependence, cigarettes, with unspecified nicotine-induced disorders: Secondary | ICD-10-CM | POA: Diagnosis not present

## 2018-10-20 DIAGNOSIS — E11622 Type 2 diabetes mellitus with other skin ulcer: Secondary | ICD-10-CM | POA: Diagnosis not present

## 2018-10-20 DIAGNOSIS — L97812 Non-pressure chronic ulcer of other part of right lower leg with fat layer exposed: Secondary | ICD-10-CM | POA: Diagnosis not present

## 2018-10-20 DIAGNOSIS — Z9181 History of falling: Secondary | ICD-10-CM | POA: Diagnosis not present

## 2018-10-20 DIAGNOSIS — I69351 Hemiplegia and hemiparesis following cerebral infarction affecting right dominant side: Secondary | ICD-10-CM | POA: Diagnosis not present

## 2018-10-20 DIAGNOSIS — J449 Chronic obstructive pulmonary disease, unspecified: Secondary | ICD-10-CM | POA: Diagnosis not present

## 2018-10-21 DIAGNOSIS — E11622 Type 2 diabetes mellitus with other skin ulcer: Secondary | ICD-10-CM | POA: Diagnosis not present

## 2018-10-21 DIAGNOSIS — J449 Chronic obstructive pulmonary disease, unspecified: Secondary | ICD-10-CM | POA: Diagnosis not present

## 2018-10-21 DIAGNOSIS — Z7982 Long term (current) use of aspirin: Secondary | ICD-10-CM | POA: Diagnosis not present

## 2018-10-21 DIAGNOSIS — F17219 Nicotine dependence, cigarettes, with unspecified nicotine-induced disorders: Secondary | ICD-10-CM | POA: Diagnosis not present

## 2018-10-21 DIAGNOSIS — Z85118 Personal history of other malignant neoplasm of bronchus and lung: Secondary | ICD-10-CM | POA: Diagnosis not present

## 2018-10-21 DIAGNOSIS — I69320 Aphasia following cerebral infarction: Secondary | ICD-10-CM | POA: Diagnosis not present

## 2018-10-21 DIAGNOSIS — L97812 Non-pressure chronic ulcer of other part of right lower leg with fat layer exposed: Secondary | ICD-10-CM | POA: Diagnosis not present

## 2018-10-21 DIAGNOSIS — Z9181 History of falling: Secondary | ICD-10-CM | POA: Diagnosis not present

## 2018-10-21 DIAGNOSIS — I69351 Hemiplegia and hemiparesis following cerebral infarction affecting right dominant side: Secondary | ICD-10-CM | POA: Diagnosis not present

## 2018-10-23 DIAGNOSIS — L97812 Non-pressure chronic ulcer of other part of right lower leg with fat layer exposed: Secondary | ICD-10-CM | POA: Diagnosis not present

## 2018-10-23 DIAGNOSIS — J449 Chronic obstructive pulmonary disease, unspecified: Secondary | ICD-10-CM | POA: Diagnosis not present

## 2018-10-23 DIAGNOSIS — Z7982 Long term (current) use of aspirin: Secondary | ICD-10-CM | POA: Diagnosis not present

## 2018-10-23 DIAGNOSIS — I69320 Aphasia following cerebral infarction: Secondary | ICD-10-CM | POA: Diagnosis not present

## 2018-10-23 DIAGNOSIS — Z85118 Personal history of other malignant neoplasm of bronchus and lung: Secondary | ICD-10-CM | POA: Diagnosis not present

## 2018-10-23 DIAGNOSIS — I69351 Hemiplegia and hemiparesis following cerebral infarction affecting right dominant side: Secondary | ICD-10-CM | POA: Diagnosis not present

## 2018-10-23 DIAGNOSIS — Z9181 History of falling: Secondary | ICD-10-CM | POA: Diagnosis not present

## 2018-10-23 DIAGNOSIS — E11622 Type 2 diabetes mellitus with other skin ulcer: Secondary | ICD-10-CM | POA: Diagnosis not present

## 2018-10-23 DIAGNOSIS — F17219 Nicotine dependence, cigarettes, with unspecified nicotine-induced disorders: Secondary | ICD-10-CM | POA: Diagnosis not present

## 2018-10-26 DIAGNOSIS — Z85118 Personal history of other malignant neoplasm of bronchus and lung: Secondary | ICD-10-CM | POA: Diagnosis not present

## 2018-10-26 DIAGNOSIS — Z9181 History of falling: Secondary | ICD-10-CM | POA: Diagnosis not present

## 2018-10-26 DIAGNOSIS — Z7982 Long term (current) use of aspirin: Secondary | ICD-10-CM | POA: Diagnosis not present

## 2018-10-26 DIAGNOSIS — E11622 Type 2 diabetes mellitus with other skin ulcer: Secondary | ICD-10-CM | POA: Diagnosis not present

## 2018-10-26 DIAGNOSIS — J449 Chronic obstructive pulmonary disease, unspecified: Secondary | ICD-10-CM | POA: Diagnosis not present

## 2018-10-26 DIAGNOSIS — L97812 Non-pressure chronic ulcer of other part of right lower leg with fat layer exposed: Secondary | ICD-10-CM | POA: Diagnosis not present

## 2018-10-26 DIAGNOSIS — I69320 Aphasia following cerebral infarction: Secondary | ICD-10-CM | POA: Diagnosis not present

## 2018-10-26 DIAGNOSIS — F17219 Nicotine dependence, cigarettes, with unspecified nicotine-induced disorders: Secondary | ICD-10-CM | POA: Diagnosis not present

## 2018-10-26 DIAGNOSIS — I69351 Hemiplegia and hemiparesis following cerebral infarction affecting right dominant side: Secondary | ICD-10-CM | POA: Diagnosis not present

## 2018-10-27 ENCOUNTER — Ambulatory Visit: Payer: Medicare HMO | Admitting: Neurology

## 2018-10-28 ENCOUNTER — Other Ambulatory Visit: Payer: Self-pay

## 2018-10-28 ENCOUNTER — Ambulatory Visit (INDEPENDENT_AMBULATORY_CARE_PROVIDER_SITE_OTHER): Payer: Medicare HMO | Admitting: Family Medicine

## 2018-10-28 ENCOUNTER — Encounter: Payer: Self-pay | Admitting: Family Medicine

## 2018-10-28 VITALS — BP 140/78 | HR 60 | Temp 98.0°F | Resp 16 | Ht 70.75 in | Wt 243.0 lb

## 2018-10-28 DIAGNOSIS — R6889 Other general symptoms and signs: Secondary | ICD-10-CM | POA: Diagnosis not present

## 2018-10-28 DIAGNOSIS — L97812 Non-pressure chronic ulcer of other part of right lower leg with fat layer exposed: Secondary | ICD-10-CM | POA: Diagnosis not present

## 2018-10-28 DIAGNOSIS — E11621 Type 2 diabetes mellitus with foot ulcer: Secondary | ICD-10-CM | POA: Diagnosis not present

## 2018-10-28 DIAGNOSIS — L97512 Non-pressure chronic ulcer of other part of right foot with fat layer exposed: Secondary | ICD-10-CM | POA: Diagnosis not present

## 2018-10-28 DIAGNOSIS — R5382 Chronic fatigue, unspecified: Secondary | ICD-10-CM | POA: Diagnosis not present

## 2018-10-28 DIAGNOSIS — J449 Chronic obstructive pulmonary disease, unspecified: Secondary | ICD-10-CM | POA: Diagnosis not present

## 2018-10-28 DIAGNOSIS — F17219 Nicotine dependence, cigarettes, with unspecified nicotine-induced disorders: Secondary | ICD-10-CM | POA: Diagnosis not present

## 2018-10-28 DIAGNOSIS — I69959 Hemiplegia and hemiparesis following unspecified cerebrovascular disease affecting unspecified side: Secondary | ICD-10-CM | POA: Diagnosis not present

## 2018-10-28 DIAGNOSIS — E11622 Type 2 diabetes mellitus with other skin ulcer: Secondary | ICD-10-CM | POA: Diagnosis not present

## 2018-10-28 NOTE — Progress Notes (Signed)
Subjective:    Patient ID: Brad Taylor, male    DOB: April 14, 1951, 68 y.o.   MRN: [id number]   Patient presents today with his wife complaining of fatigue.  Apparently this is been gradually worsening over the last month.  He states that he has no energy.  We went through his review of systems to try to pinpoint the cause.  He denies any chest pain, shortness of breath, dyspnea on exertion.  He denies any cough or wheezing or hemoptysis or pleurisy.  He denies any nausea or vomiting.  He denies any diarrhea.  He does have constipation related to his narcotic use.  He denies any dysuria, hematuria, polyuria.  He denies any abdominal discomfort.  He has a history of hypogonadism however last year when he tried testosterone he saw no improvement in his fatigue or his hot flashes and we started him on gabapentin which is helped with his hot flashes.  His previous testosterone level however was low at 84.  He also has a history of sleep apnea however he is not currently wearing CPAP due to the fact he cannot tolerate the mask secondary to [employer].  He also has severe depression which is currently being treated with Wellbutrin, 300 mg a day of Effexor, and 300 mg p.o. nightly of trazodone.  He is also on MS Contin.  There form also concerned about polypharmacy potentially causing his fatigue.  His depression medication and pain medication is prescribed through the [place].  He states that his blood sugars have been well controlled recently Past Medical History:  Diagnosis Date  . AAA (abdominal aortic aneurysm) ([place]) 5/15   3.5x3.5 cm  . COPD (chronic obstructive pulmonary disease) ([place])   . Depression   . Diabetes mellitus without complication ([place])   . GERD (gastroesophageal reflux disease)   . Hyperlipidemia   . Hypothyroidism   . Post traumatic stress disorder   . Stroke ([place])   . Thyroid disease   . Ulcer    Past Surgical History:  Procedure Laterality Date  . ABDOMINAL AORTOGRAM W/LOWER  EXTREMITY N/A 08/20/2017   Procedure: ABDOMINAL AORTOGRAM W/LOWER EXTREMITY;  Surgeon: Waynetta Sandy, MD;  Location: [place] [facility];  Service: Cardiovascular;  Laterality: N/A;  . CATARACT EXTRACTION    . CHOLECYSTECTOMY    . ENDOVENOUS ABLATION SAPHENOUS VEIN W/ LASER Right 03/18/2018   endovenous laser ablation R GSV by Ruta Hinds MD   . ENDOVENOUS ABLATION SAPHENOUS VEIN W/ LASER Right 04/08/2018   endovenous laser ablation R SSV by Ruta Hinds MD   . [place]    . LUNG REMOVAL, PARTIAL     right, 2 years ago at [facility] for cancer  . PERIPHERAL VASCULAR ATHERECTOMY Right 08/20/2017   Procedure: PERIPHERAL VASCULAR ATHERECTOMY;  Surgeon: Waynetta Sandy, MD;  Location: [place] [facility];  Service: Cardiovascular;  Laterality: Right;  posterior tibial and tibeoperoneal trunk  . PERIPHERAL VASCULAR BALLOON ANGIOPLASTY Right 08/20/2017   Procedure: PERIPHERAL VASCULAR BALLOON ANGIOPLASTY;  Surgeon: Waynetta Sandy, MD;  Location: [place] [facility];  Service: Cardiovascular;  Laterality: Right;  Anterior tibial  . VIDEO BRONCHOSCOPY Bilateral 12/30/2017   Procedure: VIDEO BRONCHOSCOPY WITHOUT FLUORO;  Surgeon: Marshell Garfinkel, MD;  Location: [facility];  Service: Cardiopulmonary;  Laterality: Bilateral;   Current Outpatient Medications on File Prior to Visit  Medication Sig Dispense Refill  . albuterol (PROVENTIL HFA;VENTOLIN HFA) 108 (90 Base) MCG/ACT inhaler INHALE 2 PUFFS INTO THE LUNGS EVERY 6  HOURS AS NEEDED FOR WHEEZE OR SHORTNESS OF BREATH 18 g 3  . atorvastatin (LIPITOR) 10 MG tablet Take 10 mg by mouth every other day.     . busPIRone (BUSPAR) 10 MG tablet Take 0.5 tablets (5 mg total) by mouth 2 (two) times daily as needed (for anxiety). (Patient taking differently: Take 10 mg by mouth 2 (two) times daily. ) 60 tablet 0  . clopidogrel (PLAVIX) 75 MG tablet Take 1 tablet (75 mg total) by mouth daily with breakfast. 90 tablet 3  . doxycycline  (VIBRAMYCIN) 100 MG capsule Take 100 mg by mouth 2 (two) times daily.    . Fluticasone-Umeclidin-Vilant (TRELEGY ELLIPTA) 100-62.5-25 MCG/INH AEPB Inhale 1 Inhaler into the lungs daily. 1 each 11  . gabapentin (NEURONTIN) 300 MG capsule Take 1 capsule (300 mg total) by mouth 3 (three) times daily. (Patient taking differently: Take 300 mg by mouth 2 (two) times daily. ) 90 capsule 3  . insulin glargine (LANTUS) 100 UNIT/ML injection Inject 45-55 Units into the skin 2 (two) times daily. Take 45 units in the morning and 55 units at night    . levothyroxine (SYNTHROID, LEVOTHROID) 125 MCG tablet Take 1 tablet (125 mcg total) by mouth daily before breakfast. 30 tablet 1  . metFORMIN (GLUCOPHAGE) 500 MG tablet Take 500 mg by mouth 2 (two) times daily with a meal.    . morphine (MS CONTIN) 100 MG 12 hr tablet Take 100 mg by mouth every 12 (twelve) hours as needed for pain.     Marland Kitchen MOVANTIK 25 MG TABS tablet TAKE 1 TABLET BY MOUTH EVERY DAY 30 tablet 5  . oxymetazoline (AFRIN) 0.05 % nasal spray Place 2 sprays into both nostrils 2 (two) times daily as needed for congestion.    . RABEprazole (ACIPHEX) 20 MG tablet Take 2 tablets (40 mg total) by mouth daily before breakfast. 30 tablet 1  . tamsulosin (FLOMAX) 0.4 MG CAPS capsule Take 0.4 mg by mouth at bedtime.     . Tiotropium Bromide-Olodaterol (STIOLTO RESPIMAT) 2.5-2.5 MCG/ACT AERS Inhale 2 Inhalers into the lungs daily. 12 g 3  . traZODone (DESYREL) 100 MG tablet Take 3 tablets (300 mg total) by mouth at bedtime. 30 tablet 0  . venlafaxine XR (EFFEXOR-XR) 150 MG 24 hr capsule Take 1 capsule (150 mg total) by mouth daily with breakfast. (Patient taking differently: Take 150 mg by mouth 2 (two) times daily. ) 30 capsule 1   No current facility-administered medications on file prior to visit.    No Known Allergies Social History   Socioeconomic History  . Marital status: Married    Spouse name: Diane  . Number of children: 1  . Years of education: HS   . Highest education level: Not on file  Occupational History  . Occupation: Retired Dealer    Comment: [employer]  Social Needs  . Financial resource strain: Not on file  . Food insecurity:    Worry: Not on file    Inability: Not on file  . Transportation needs:    Medical: Not on file    Non-medical: Not on file  Tobacco Use  . Smoking status: Current Every Day Smoker    Packs/day: 1.00    Years: 30.00    Pack years: 30.00    Types: Cigarettes  . Smokeless tobacco: Former Systems developer    Quit date: 05/04/2013  . Tobacco comment: Less than 1/2 pk per day  Substance and Sexual Activity  . Alcohol use: No  Comment: Former heavy drinker, been sober for 10 years  . Drug use: No  . Sexual activity: Not on file  Lifestyle  . Physical activity:    Days per week: Not on file    Minutes per session: Not on file  . Stress: Not on file  Relationships  . Social connections:    Talks on phone: Not on file    Gets together: Not on file    Attends religious service: Not on file    Active member of club or organization: Not on file    Attends meetings of clubs or organizations: Not on file    Relationship status: Not on file  . Intimate partner violence:    Fear of current or ex partner: Not on file    Emotionally abused: Not on file    Physically abused: Not on file    Forced sexual activity: Not on file  Other Topics Concern  . Not on file  Social History Narrative   Patient lives at home with spouse.   Caffeine Use: 3-4 cups daily   Retired Dealer.      Review of Systems  Gastrointestinal: Positive for constipation.  All other systems reviewed and are negative.      Objective:   Physical Exam  Constitutional: He appears well-developed and well-nourished. No distress.  HENT:  Right Ear: External ear normal.  Left Ear: External ear normal.  Nose: Nose normal.  Mouth/Throat: Oropharynx is clear and moist. No oropharyngeal exudate.  Eyes: Conjunctivae are normal.   Neck: Neck supple. No JVD present. No thyromegaly present.  Cardiovascular: Normal rate, regular rhythm and normal heart sounds.  No murmur heard. Pulmonary/Chest: Effort normal. No accessory muscle usage. He has no decreased breath sounds. He has no wheezes. He has no rhonchi. He has no rales.  Abdominal: Soft. Bowel sounds are normal. He exhibits no distension and no mass. There is no abdominal tenderness. There is no rebound and no guarding.  Lymphadenopathy:    He has no cervical adenopathy.  Skin: Lesion noted. He is not diaphoretic.  Vitals reviewed.         Assessment & Plan:   Chronic fatigue - Plan: Testosterone, Vitamin B12, CBC with Differential/Platelet, COMPLETE METABOLIC PANEL WITH GFR, TSH  Begin by evaluating for medical causes of fatigue.  I will check a CBC to evaluate for any anemia or bone marrow abnormalities.  I will check a CMP to evaluate for any evidence of liver or kidney dysfunction.  Patient states that his blood sugars have been in the 100s and he denies any hypoglycemic episodes.  I will check for hypo-thyroidism with a TSH.  I will check for hypogonadism with a testosterone level.  I will also check for vitamin B12 deficiency.  My personal opinion is I believe the patient is more likely dealing with fatigue related to depression.  I also believe this may be partially due to to polypharmacy.  If his lab work is normal, I would recommend decreasing his dose of trazodone and considering adding a stimulant for depression such as Adderall in the morning.  Patient also has COPD that is poorly treated and also a history of hypogonadism which can also contribute to his fatigue.  Treatment of his fatigue may also require treating these to underlying issues.

## 2018-10-29 LAB — CBC WITH DIFFERENTIAL/PLATELET
Absolute Monocytes: 821 cells/uL (ref 200–950)
Basophils Absolute: 68 cells/uL (ref 0–200)
Basophils Relative: 0.6 %
Eosinophils Absolute: 91 cells/uL (ref 15–500)
Eosinophils Relative: 0.8 %
HCT: 41.8 % (ref 38.5–50.0)
Hemoglobin: 14 g/dL (ref 13.2–17.1)
Lymphs Abs: 3226 cells/uL (ref 850–3900)
MCH: 29.5 pg (ref 27.0–33.0)
MCHC: 33.5 g/dL (ref 32.0–36.0)
MCV: 88.2 fL (ref 80.0–100.0)
MPV: 10.5 fL (ref 7.5–12.5)
Monocytes Relative: 7.2 %
NEUTROS PCT: 63.1 %
Neutro Abs: 7193 cells/uL (ref 1500–7800)
Platelets: 252 10*3/uL (ref 140–400)
RBC: 4.74 10*6/uL (ref 4.20–5.80)
RDW: 13.2 % (ref 11.0–15.0)
Total Lymphocyte: 28.3 %
WBC: 11.4 10*3/uL — ABNORMAL HIGH (ref 3.8–10.8)

## 2018-10-29 LAB — COMPLETE METABOLIC PANEL WITH GFR
AG Ratio: 1.5 (calc) (ref 1.0–2.5)
ALT: 11 U/L (ref 9–46)
AST: 12 U/L (ref 10–35)
Albumin: 3.9 g/dL (ref 3.6–5.1)
Alkaline phosphatase (APISO): 98 U/L (ref 35–144)
BUN: 14 mg/dL (ref 7–25)
CO2: 26 mmol/L (ref 20–32)
Calcium: 9.6 mg/dL (ref 8.6–10.3)
Chloride: 93 mmol/L — ABNORMAL LOW (ref 98–110)
Creat: 0.78 mg/dL (ref 0.70–1.25)
GFR, EST NON AFRICAN AMERICAN: 92 mL/min/{1.73_m2} (ref >=60)
GFR, Est African American: 107 mL/min/{1.73_m2} (ref >=60)
Globulin: 2.6 g/dL (calc) (ref 1.9–3.7)
Glucose, Bld: 157 mg/dL — ABNORMAL HIGH (ref 65–99)
Potassium: 5 mmol/L (ref 3.5–5.3)
Sodium: 133 mmol/L — ABNORMAL LOW (ref 135–146)
Total Bilirubin: 0.7 mg/dL (ref 0.2–1.2)
Total Protein: 6.5 g/dL (ref 6.1–8.1)

## 2018-10-29 LAB — TESTOSTERONE: Testosterone: 113 ng/dL — ABNORMAL LOW (ref 250–827)

## 2018-10-29 LAB — TSH: TSH: 2.65 mIU/L (ref 0.40–4.50)

## 2018-10-29 LAB — VITAMIN B12: Vitamin B-12: 548 pg/mL (ref 200–1100)

## 2018-10-30 DIAGNOSIS — L97812 Non-pressure chronic ulcer of other part of right lower leg with fat layer exposed: Secondary | ICD-10-CM | POA: Diagnosis not present

## 2018-10-30 DIAGNOSIS — I69320 Aphasia following cerebral infarction: Secondary | ICD-10-CM | POA: Diagnosis not present

## 2018-10-30 DIAGNOSIS — Z7982 Long term (current) use of aspirin: Secondary | ICD-10-CM | POA: Diagnosis not present

## 2018-10-30 DIAGNOSIS — J449 Chronic obstructive pulmonary disease, unspecified: Secondary | ICD-10-CM | POA: Diagnosis not present

## 2018-10-30 DIAGNOSIS — I69351 Hemiplegia and hemiparesis following cerebral infarction affecting right dominant side: Secondary | ICD-10-CM | POA: Diagnosis not present

## 2018-10-30 DIAGNOSIS — F17219 Nicotine dependence, cigarettes, with unspecified nicotine-induced disorders: Secondary | ICD-10-CM | POA: Diagnosis not present

## 2018-10-30 DIAGNOSIS — Z85118 Personal history of other malignant neoplasm of bronchus and lung: Secondary | ICD-10-CM | POA: Diagnosis not present

## 2018-10-30 DIAGNOSIS — E11622 Type 2 diabetes mellitus with other skin ulcer: Secondary | ICD-10-CM | POA: Diagnosis not present

## 2018-10-30 DIAGNOSIS — Z9181 History of falling: Secondary | ICD-10-CM | POA: Diagnosis not present

## 2018-11-02 DIAGNOSIS — E11622 Type 2 diabetes mellitus with other skin ulcer: Secondary | ICD-10-CM | POA: Diagnosis not present

## 2018-11-02 DIAGNOSIS — Z9181 History of falling: Secondary | ICD-10-CM | POA: Diagnosis not present

## 2018-11-02 DIAGNOSIS — J449 Chronic obstructive pulmonary disease, unspecified: Secondary | ICD-10-CM | POA: Diagnosis not present

## 2018-11-02 DIAGNOSIS — L97812 Non-pressure chronic ulcer of other part of right lower leg with fat layer exposed: Secondary | ICD-10-CM | POA: Diagnosis not present

## 2018-11-02 DIAGNOSIS — Z7982 Long term (current) use of aspirin: Secondary | ICD-10-CM | POA: Diagnosis not present

## 2018-11-02 DIAGNOSIS — I69351 Hemiplegia and hemiparesis following cerebral infarction affecting right dominant side: Secondary | ICD-10-CM | POA: Diagnosis not present

## 2018-11-02 DIAGNOSIS — I69320 Aphasia following cerebral infarction: Secondary | ICD-10-CM | POA: Diagnosis not present

## 2018-11-02 DIAGNOSIS — Z85118 Personal history of other malignant neoplasm of bronchus and lung: Secondary | ICD-10-CM | POA: Diagnosis not present

## 2018-11-02 DIAGNOSIS — F17219 Nicotine dependence, cigarettes, with unspecified nicotine-induced disorders: Secondary | ICD-10-CM | POA: Diagnosis not present

## 2018-11-04 DIAGNOSIS — L97812 Non-pressure chronic ulcer of other part of right lower leg with fat layer exposed: Secondary | ICD-10-CM | POA: Diagnosis not present

## 2018-11-04 DIAGNOSIS — J449 Chronic obstructive pulmonary disease, unspecified: Secondary | ICD-10-CM | POA: Diagnosis not present

## 2018-11-04 DIAGNOSIS — Z7982 Long term (current) use of aspirin: Secondary | ICD-10-CM | POA: Diagnosis not present

## 2018-11-04 DIAGNOSIS — Z85118 Personal history of other malignant neoplasm of bronchus and lung: Secondary | ICD-10-CM | POA: Diagnosis not present

## 2018-11-04 DIAGNOSIS — Z9181 History of falling: Secondary | ICD-10-CM | POA: Diagnosis not present

## 2018-11-04 DIAGNOSIS — I69320 Aphasia following cerebral infarction: Secondary | ICD-10-CM | POA: Diagnosis not present

## 2018-11-04 DIAGNOSIS — I69351 Hemiplegia and hemiparesis following cerebral infarction affecting right dominant side: Secondary | ICD-10-CM | POA: Diagnosis not present

## 2018-11-04 DIAGNOSIS — E11622 Type 2 diabetes mellitus with other skin ulcer: Secondary | ICD-10-CM | POA: Diagnosis not present

## 2018-11-04 DIAGNOSIS — F17219 Nicotine dependence, cigarettes, with unspecified nicotine-induced disorders: Secondary | ICD-10-CM | POA: Diagnosis not present

## 2018-11-06 ENCOUNTER — Other Ambulatory Visit: Payer: Self-pay | Admitting: Family Medicine

## 2018-11-06 DIAGNOSIS — J449 Chronic obstructive pulmonary disease, unspecified: Secondary | ICD-10-CM | POA: Diagnosis not present

## 2018-11-06 DIAGNOSIS — I69320 Aphasia following cerebral infarction: Secondary | ICD-10-CM | POA: Diagnosis not present

## 2018-11-06 DIAGNOSIS — F17219 Nicotine dependence, cigarettes, with unspecified nicotine-induced disorders: Secondary | ICD-10-CM | POA: Diagnosis not present

## 2018-11-06 DIAGNOSIS — I69351 Hemiplegia and hemiparesis following cerebral infarction affecting right dominant side: Secondary | ICD-10-CM | POA: Diagnosis not present

## 2018-11-06 DIAGNOSIS — L97812 Non-pressure chronic ulcer of other part of right lower leg with fat layer exposed: Secondary | ICD-10-CM | POA: Diagnosis not present

## 2018-11-06 DIAGNOSIS — Z85118 Personal history of other malignant neoplasm of bronchus and lung: Secondary | ICD-10-CM | POA: Diagnosis not present

## 2018-11-06 DIAGNOSIS — Z9181 History of falling: Secondary | ICD-10-CM | POA: Diagnosis not present

## 2018-11-06 DIAGNOSIS — E11622 Type 2 diabetes mellitus with other skin ulcer: Secondary | ICD-10-CM | POA: Diagnosis not present

## 2018-11-06 DIAGNOSIS — Z7982 Long term (current) use of aspirin: Secondary | ICD-10-CM | POA: Diagnosis not present

## 2018-11-06 DIAGNOSIS — G4710 Hypersomnia, unspecified: Secondary | ICD-10-CM

## 2018-11-06 DIAGNOSIS — R5382 Chronic fatigue, unspecified: Secondary | ICD-10-CM

## 2018-11-06 DIAGNOSIS — G4730 Sleep apnea, unspecified: Secondary | ICD-10-CM

## 2018-11-09 ENCOUNTER — Other Ambulatory Visit: Payer: Self-pay

## 2018-11-09 ENCOUNTER — Ambulatory Visit (INDEPENDENT_AMBULATORY_CARE_PROVIDER_SITE_OTHER): Payer: Medicare HMO | Admitting: Family Medicine

## 2018-11-09 DIAGNOSIS — Z9181 History of falling: Secondary | ICD-10-CM | POA: Diagnosis not present

## 2018-11-09 DIAGNOSIS — E11622 Type 2 diabetes mellitus with other skin ulcer: Secondary | ICD-10-CM | POA: Diagnosis not present

## 2018-11-09 DIAGNOSIS — R5382 Chronic fatigue, unspecified: Secondary | ICD-10-CM | POA: Diagnosis not present

## 2018-11-09 DIAGNOSIS — G4730 Sleep apnea, unspecified: Secondary | ICD-10-CM | POA: Diagnosis not present

## 2018-11-09 DIAGNOSIS — I69320 Aphasia following cerebral infarction: Secondary | ICD-10-CM | POA: Diagnosis not present

## 2018-11-09 DIAGNOSIS — G4710 Hypersomnia, unspecified: Secondary | ICD-10-CM

## 2018-11-09 DIAGNOSIS — I69351 Hemiplegia and hemiparesis following cerebral infarction affecting right dominant side: Secondary | ICD-10-CM | POA: Diagnosis not present

## 2018-11-09 DIAGNOSIS — Z7982 Long term (current) use of aspirin: Secondary | ICD-10-CM | POA: Diagnosis not present

## 2018-11-09 DIAGNOSIS — Z85118 Personal history of other malignant neoplasm of bronchus and lung: Secondary | ICD-10-CM | POA: Diagnosis not present

## 2018-11-09 DIAGNOSIS — F17219 Nicotine dependence, cigarettes, with unspecified nicotine-induced disorders: Secondary | ICD-10-CM | POA: Diagnosis not present

## 2018-11-09 DIAGNOSIS — J449 Chronic obstructive pulmonary disease, unspecified: Secondary | ICD-10-CM | POA: Diagnosis not present

## 2018-11-09 DIAGNOSIS — L97812 Non-pressure chronic ulcer of other part of right lower leg with fat layer exposed: Secondary | ICD-10-CM | POA: Diagnosis not present

## 2018-11-09 MED ORDER — AMPHETAMINE-DEXTROAMPHET ER 15 MG PO CP24: 15.0000 mg | ORAL_CAPSULE | ORAL | 0 refills | Status: DC

## 2018-11-09 NOTE — Progress Notes (Signed)
Subjective:    Patient ID: Brad Taylor, male    DOB: March 25, 1951, 68 y.o.   MRN: [id number]  10/28/18 Patient presents today with his wife complaining of fatigue.  Apparently this is been gradually worsening over the last month.  He states that he has no energy.  We went through his review of systems to try to pinpoint the cause.  He denies any chest pain, shortness of breath, dyspnea on exertion.  He denies any cough or wheezing or hemoptysis or pleurisy.  He denies any nausea or vomiting.  He denies any diarrhea.  He does have constipation related to his narcotic use.  He denies any dysuria, hematuria, polyuria.  He denies any abdominal discomfort.  He has a history of hypogonadism however last year when he tried testosterone he saw no improvement in his fatigue or his hot flashes and we started him on gabapentin which is helped with his hot flashes.  His previous testosterone level however was low at 84.  He also has a history of sleep apnea however he is not currently wearing CPAP due to the fact he cannot tolerate the mask secondary to [employer].  He also has severe depression which is currently being treated with Wellbutrin, 300 mg a day of Effexor, and 300 mg p.o. nightly of trazodone.  He is also on MS Contin.  There form also concerned about polypharmacy potentially causing his fatigue.  His depression medication and pain medication is prescribed through the [place].  He states that his blood sugars have been well controlled recently.  At that time, my plan was: Begin by evaluating for medical causes of fatigue.  I will check a CBC to evaluate for any anemia or bone marrow abnormalities.  I will check a CMP to evaluate for any evidence of liver or kidney dysfunction.  Patient states that his blood sugars have been in the 100s and he denies any hypoglycemic episodes.  I will check for hypo-thyroidism with a TSH.  I will check for hypogonadism with a testosterone level.  I will also check for vitamin  B12 deficiency.  My personal opinion is I believe the patient is more likely dealing with fatigue related to depression.  I also believe this may be partially due to to polypharmacy.  If his lab work is normal, I would recommend decreasing his dose of trazodone and considering adding a stimulant for depression such as Adderall in the morning.  Patient also has COPD that is poorly treated and also a history of hypogonadism which can also contribute to his fatigue.  Treatment of his fatigue may also require treating these to underlying issues.  11/09/18 Office Visit on 10/28/2018  Component Date Value Ref Range Status  . Testosterone 10/28/2018 113* 250 - 827 ng/dL Final   Comment: In hypogonadal males, Testosterone, Total, LC/MS/MS, is the recommended assay due to the diminished accuracy of immunoassay at levels below 250 ng/dL. This test code [id number]) must be collected in a red-top tube with no gel.    . Vitamin B-12 10/28/2018 548  200 - 1,100 pg/mL Final  . WBC 10/28/2018 11.4* 3.8 - 10.8 Thousand/uL Final  . RBC 10/28/2018 4.74  4.20 - 5.80 Million/uL Final  . Hemoglobin 10/28/2018 14.0  13.2 - 17.1 g/dL Final  . HCT 10/28/2018 41.8  38.5 - 50.0 % Final  . MCV 10/28/2018 88.2  80.0 - 100.0 fL Final  . MCH 10/28/2018 29.5  27.0 - 33.0 pg Final  . MCHC 10/28/2018  33.5  32.0 - 36.0 g/dL Final  . RDW 10/28/2018 13.2  11.0 - 15.0 % Final  . Platelets 10/28/2018 252  140 - 400 Thousand/uL Final  . MPV 10/28/2018 10.5  7.5 - 12.5 fL Final  . Neutro Abs 10/28/2018 7,193  1,500 - 7,800 cells/uL Final  . Lymphs Abs 10/28/2018 3,226  850 - 3,900 cells/uL Final  . Absolute Monocytes 10/28/2018 821  200 - 950 cells/uL Final  . Eosinophils Absolute 10/28/2018 91  15 - 500 cells/uL Final  . Basophils Absolute 10/28/2018 68  0 - 200 cells/uL Final  . Neutrophils Relative % 10/28/2018 63.1  % Final  . Total Lymphocyte 10/28/2018 28.3  % Final  . Monocytes Relative 10/28/2018 7.2  % Final  . Eosinophils  Relative 10/28/2018 0.8  % Final  . Basophils Relative 10/28/2018 0.6  % Final  . Glucose, Bld 10/28/2018 157* 65 - 99 mg/dL Final   Comment: .            Fasting reference interval . For someone without known diabetes, a glucose value >125 mg/dL indicates that they may have diabetes and this should be confirmed with a follow-up test. .   . BUN 10/28/2018 14  7 - 25 mg/dL Final  . Creat 10/28/2018 0.78  0.70 - 1.25 mg/dL Final   Comment: For patients >59 years of age, the reference limit for Creatinine is approximately 13% higher for people identified as African-American. .   . GFR, Est Non African American 10/28/2018 92  > OR = 60 mL/min/1.47m2 Final  . GFR, Est African American 10/28/2018 107  > OR = 60 mL/min/1.6m2 Final  . BUN/Creatinine Ratio 23/30/0762 NOT APPLICABLE  6 - 22 (calc) Final  . Sodium 10/28/2018 133* 135 - 146 mmol/L Final  . Potassium 10/28/2018 5.0  3.5 - 5.3 mmol/L Final  . Chloride 10/28/2018 93* 98 - 110 mmol/L Final  . CO2 10/28/2018 26  20 - 32 mmol/L Final  . Calcium 10/28/2018 9.6  8.6 - 10.3 mg/dL Final  . Total Protein 10/28/2018 6.5  6.1 - 8.1 g/dL Final  . Albumin 10/28/2018 3.9  3.6 - 5.1 g/dL Final  . Globulin 10/28/2018 2.6  1.9 - 3.7 g/dL (calc) Final  . AG Ratio 10/28/2018 1.5  1.0 - 2.5 (calc) Final  . Total Bilirubin 10/28/2018 0.7  0.2 - 1.2 mg/dL Final  . Alkaline phosphatase (APISO) 10/28/2018 98  35 - 144 U/L Final  . AST 10/28/2018 12  10 - 35 U/L Final  . ALT 10/28/2018 11  9 - 46 U/L Final  . TSH 10/28/2018 2.65  0.40 - 4.50 mIU/L Final   Labs again showed low testosterone however the remainder of his lab work was normal.  Patient is being seen today over the telephone.  He consents to a phone visit.  Both he and his wife are on the telephone together in a conference call.  They are at home.  I am currently in my office.  Phone call began at 9:52 AM.  Patient again endorses that he is extremely tired.  As soon as he wakes up in the  morning he feels extremely sleepy with no energy.  If he sits down in a chair or any amount of time he will rapidly fall asleep.  They have scheduled a sleep study at home however this has not been performed yet.  He also continues to have symptoms of depression in addition to his fatigue and poor focus and lack of  energy.  He denies any chest pain or shortness of breath or dyspnea on exertion beyond his baseline dyspnea due to his COPD.  There have been no sudden changes.  He also continues to report hot flashes and cold sweats but this is been present for more than a year and has been attributed to his hypogonadism and low T which we are not currently treating.  In the past when he tried testosterone he saw no benefit in treating his fatigue. Past Medical History:  Diagnosis Date  . AAA (abdominal aortic aneurysm) ([place]) 5/15   3.5x3.5 cm  . COPD (chronic obstructive pulmonary disease) ([place])   . Depression   . Diabetes mellitus without complication ([place])   . GERD (gastroesophageal reflux disease)   . Hyperlipidemia   . Hypothyroidism   . Post traumatic stress disorder   . Stroke ([place])   . Thyroid disease   . Ulcer    Past Surgical History:  Procedure Laterality Date  . ABDOMINAL AORTOGRAM W/LOWER EXTREMITY N/A 08/20/2017   Procedure: ABDOMINAL AORTOGRAM W/LOWER EXTREMITY;  Surgeon: Waynetta Sandy, MD;  Location: [place] [facility];  Service: Cardiovascular;  Laterality: N/A;  . CATARACT EXTRACTION    . CHOLECYSTECTOMY    . ENDOVENOUS ABLATION SAPHENOUS VEIN W/ LASER Right 03/18/2018   endovenous laser ablation R GSV by Ruta Hinds MD   . ENDOVENOUS ABLATION SAPHENOUS VEIN W/ LASER Right 04/08/2018   endovenous laser ablation R SSV by Ruta Hinds MD   . [place]    . LUNG REMOVAL, PARTIAL     right, 2 years ago at [facility] for cancer  . PERIPHERAL VASCULAR ATHERECTOMY Right 08/20/2017   Procedure: PERIPHERAL VASCULAR ATHERECTOMY;  Surgeon: Waynetta Sandy, MD;   Location: [place];  Service: Cardiovascular;  Laterality: Right;  posterior tibial and tibeoperoneal trunk  . PERIPHERAL VASCULAR BALLOON ANGIOPLASTY Right 08/20/2017   Procedure: PERIPHERAL VASCULAR BALLOON ANGIOPLASTY;  Surgeon: Waynetta Sandy, MD;  Location: [facility] [facility];  Service: Cardiovascular;  Laterality: Right;  Anterior tibial  . VIDEO BRONCHOSCOPY Bilateral 12/30/2017   Procedure: VIDEO BRONCHOSCOPY WITHOUT FLUORO;  Surgeon: Marshell Garfinkel, MD;  Location: [facility];  Service: Cardiopulmonary;  Laterality: Bilateral;   Current Outpatient Medications on File Prior to Visit  Medication Sig Dispense Refill  . albuterol (PROVENTIL HFA;VENTOLIN HFA) 108 (90 Base) MCG/ACT inhaler INHALE 2 PUFFS INTO THE LUNGS EVERY 6 HOURS AS NEEDED FOR WHEEZE OR SHORTNESS OF BREATH 18 g 3  . atorvastatin (LIPITOR) 10 MG tablet Take 10 mg by mouth every other day.     . busPIRone (BUSPAR) 10 MG tablet Take 0.5 tablets (5 mg total) by mouth 2 (two) times daily as needed (for anxiety). (Patient taking differently: Take 10 mg by mouth 2 (two) times daily. ) 60 tablet 0  . clopidogrel (PLAVIX) 75 MG tablet Take 1 tablet (75 mg total) by mouth daily with breakfast. 90 tablet 3  . doxycycline (VIBRAMYCIN) 100 MG capsule Take 100 mg by mouth 2 (two) times daily.    . Fluticasone-Umeclidin-Vilant (TRELEGY ELLIPTA) 100-62.5-25 MCG/INH AEPB Inhale 1 Inhaler into the lungs daily. 1 each 11  . gabapentin (NEURONTIN) 300 MG capsule Take 1 capsule (300 mg total) by mouth 3 (three) times daily. (Patient taking differently: Take 300 mg by mouth 2 (two) times daily. ) 90 capsule 3  . insulin glargine (LANTUS) 100 UNIT/ML injection Inject 45-55 Units into the skin 2 (two) times daily. Take 45 units in the morning and  55 units at night    . levothyroxine (SYNTHROID, LEVOTHROID) 125 MCG tablet Take 1 tablet (125 mcg total) by mouth daily before breakfast. 30 tablet 1  . metFORMIN (GLUCOPHAGE) 500 MG  tablet Take 500 mg by mouth 2 (two) times daily with a meal.    . morphine (MS CONTIN) 100 MG 12 hr tablet Take 100 mg by mouth every 12 (twelve) hours as needed for pain.     Marland Kitchen MOVANTIK 25 MG TABS tablet TAKE 1 TABLET BY MOUTH EVERY DAY 30 tablet 5  . oxymetazoline (AFRIN) 0.05 % nasal spray Place 2 sprays into both nostrils 2 (two) times daily as needed for congestion.    . RABEprazole (ACIPHEX) 20 MG tablet Take 2 tablets (40 mg total) by mouth daily before breakfast. 30 tablet 1  . tamsulosin (FLOMAX) 0.4 MG CAPS capsule Take 0.4 mg by mouth at bedtime.     . Tiotropium Bromide-Olodaterol (STIOLTO RESPIMAT) 2.5-2.5 MCG/ACT AERS Inhale 2 Inhalers into the lungs daily. 12 g 3  . traZODone (DESYREL) 100 MG tablet Take 3 tablets (300 mg total) by mouth at bedtime. 30 tablet 0  . venlafaxine XR (EFFEXOR-XR) 150 MG 24 hr capsule Take 1 capsule (150 mg total) by mouth daily with breakfast. (Patient taking differently: Take 150 mg by mouth 2 (two) times daily. ) 30 capsule 1   No current facility-administered medications on file prior to visit.    No Known Allergies Social History   Socioeconomic History  . Marital status: Married    Spouse name: Diane  . Number of children: 1  . Years of education: HS  . Highest education level: Not on file  Occupational History  . Occupation: Retired Dealer    Comment: [employer]  Social Needs  . Financial resource strain: Not on file  . Food insecurity:    Worry: Not on file    Inability: Not on file  . Transportation needs:    Medical: Not on file    Non-medical: Not on file  Tobacco Use  . Smoking status: Current Every Day Smoker    Packs/day: 1.00    Years: 30.00    Pack years: 30.00    Types: Cigarettes  . Smokeless tobacco: Former Systems developer    Quit date: 05/04/2013  . Tobacco comment: Less than 1/2 pk per day  Substance and Sexual Activity  . Alcohol use: No    Comment: Former heavy drinker, been sober for 10 years  . Drug use: No  .  Sexual activity: Not on file  Lifestyle  . Physical activity:    Days per week: Not on file    Minutes per session: Not on file  . Stress: Not on file  Relationships  . Social connections:    Talks on phone: Not on file    Gets together: Not on file    Attends religious service: Not on file    Active member of club or organization: Not on file    Attends meetings of clubs or organizations: Not on file    Relationship status: Not on file  . Intimate partner violence:    Fear of current or ex partner: Not on file    Emotionally abused: Not on file    Physically abused: Not on file    Forced sexual activity: Not on file  Other Topics Concern  . Not on file  Social History Narrative   Patient lives at home with spouse.   Caffeine Use: 3-4 cups daily  Retired Dealer.      Review of Systems  Gastrointestinal: Positive for constipation.  All other systems reviewed and are negative.      Objective:   No physical exam was performed as the patient was being seen over the telephone       Assessment & Plan:   Hypersomnolence  Chronic fatigue  Sleep apnea, unspecified type  Phone call concluded at 1007.  As I explained to the patient and his wife, I feel his symptoms are multifactorial.  I feel the majority of his hypersomnolence is due to untreated COPD.  I believe that polypharmacy is likely adding to this with the trazodone and his painkillers.  Also believe that his depression over his chronic health issues is also contributing to his fatigue and lack of energy and desire.  In addition, I believe his hypogonadism is contributing as well.  I have strongly recommended a sleep study which is being scheduled for the patient at home and then treatment with CPAP.  I believe this would give the patient the most benefit.  However the family and the patient would like to try a low-dose stimulant such as Adderall 15 mg p.o. every morning to see if this would help with his energy and  his drive and his motivation due to his depression in the morning which I believe is reasonable.  I would wait to see his response to the Adderall and the CPAP prior to adding testosterone to treat his hypogonadism.  We discussed the cardiovascular risk of Adderall and they are willing to try given how bad the patient feels currently.  Patient is also cutting back on his use of painkillers but has yet to see benefit from this yet

## 2018-11-11 ENCOUNTER — Encounter (HOSPITAL_BASED_OUTPATIENT_CLINIC_OR_DEPARTMENT_OTHER): Payer: Medicare HMO | Attending: Physician Assistant

## 2018-11-11 DIAGNOSIS — L97812 Non-pressure chronic ulcer of other part of right lower leg with fat layer exposed: Secondary | ICD-10-CM | POA: Diagnosis not present

## 2018-11-11 DIAGNOSIS — E11622 Type 2 diabetes mellitus with other skin ulcer: Secondary | ICD-10-CM | POA: Insufficient documentation

## 2018-11-11 DIAGNOSIS — Z9181 History of falling: Secondary | ICD-10-CM | POA: Diagnosis not present

## 2018-11-11 DIAGNOSIS — Z7982 Long term (current) use of aspirin: Secondary | ICD-10-CM | POA: Diagnosis not present

## 2018-11-11 DIAGNOSIS — F17210 Nicotine dependence, cigarettes, uncomplicated: Secondary | ICD-10-CM | POA: Insufficient documentation

## 2018-11-11 DIAGNOSIS — I872 Venous insufficiency (chronic) (peripheral): Secondary | ICD-10-CM | POA: Insufficient documentation

## 2018-11-11 DIAGNOSIS — J449 Chronic obstructive pulmonary disease, unspecified: Secondary | ICD-10-CM | POA: Diagnosis not present

## 2018-11-11 DIAGNOSIS — I69351 Hemiplegia and hemiparesis following cerebral infarction affecting right dominant side: Secondary | ICD-10-CM | POA: Diagnosis not present

## 2018-11-11 DIAGNOSIS — I69320 Aphasia following cerebral infarction: Secondary | ICD-10-CM | POA: Insufficient documentation

## 2018-11-11 DIAGNOSIS — Z85118 Personal history of other malignant neoplasm of bronchus and lung: Secondary | ICD-10-CM | POA: Diagnosis not present

## 2018-11-11 DIAGNOSIS — F17219 Nicotine dependence, cigarettes, with unspecified nicotine-induced disorders: Secondary | ICD-10-CM | POA: Diagnosis not present

## 2018-11-11 DIAGNOSIS — I69359 Hemiplegia and hemiparesis following cerebral infarction affecting unspecified side: Secondary | ICD-10-CM | POA: Insufficient documentation

## 2018-11-13 DIAGNOSIS — L97812 Non-pressure chronic ulcer of other part of right lower leg with fat layer exposed: Secondary | ICD-10-CM | POA: Diagnosis not present

## 2018-11-13 DIAGNOSIS — Z9181 History of falling: Secondary | ICD-10-CM | POA: Diagnosis not present

## 2018-11-13 DIAGNOSIS — Z85118 Personal history of other malignant neoplasm of bronchus and lung: Secondary | ICD-10-CM | POA: Diagnosis not present

## 2018-11-13 DIAGNOSIS — I69351 Hemiplegia and hemiparesis following cerebral infarction affecting right dominant side: Secondary | ICD-10-CM | POA: Diagnosis not present

## 2018-11-13 DIAGNOSIS — J449 Chronic obstructive pulmonary disease, unspecified: Secondary | ICD-10-CM | POA: Diagnosis not present

## 2018-11-13 DIAGNOSIS — Z7982 Long term (current) use of aspirin: Secondary | ICD-10-CM | POA: Diagnosis not present

## 2018-11-13 DIAGNOSIS — I69320 Aphasia following cerebral infarction: Secondary | ICD-10-CM | POA: Diagnosis not present

## 2018-11-13 DIAGNOSIS — E11622 Type 2 diabetes mellitus with other skin ulcer: Secondary | ICD-10-CM | POA: Diagnosis not present

## 2018-11-13 DIAGNOSIS — F17219 Nicotine dependence, cigarettes, with unspecified nicotine-induced disorders: Secondary | ICD-10-CM | POA: Diagnosis not present

## 2018-11-14 DIAGNOSIS — J441 Chronic obstructive pulmonary disease with (acute) exacerbation: Secondary | ICD-10-CM | POA: Diagnosis not present

## 2018-11-14 DIAGNOSIS — J440 Chronic obstructive pulmonary disease with acute lower respiratory infection: Secondary | ICD-10-CM | POA: Diagnosis not present

## 2018-11-14 DIAGNOSIS — J209 Acute bronchitis, unspecified: Secondary | ICD-10-CM | POA: Diagnosis not present

## 2018-11-17 ENCOUNTER — Telehealth: Payer: Self-pay | Admitting: Family Medicine

## 2018-11-17 DIAGNOSIS — Z9981 Dependence on supplemental oxygen: Secondary | ICD-10-CM | POA: Diagnosis not present

## 2018-11-17 DIAGNOSIS — F17219 Nicotine dependence, cigarettes, with unspecified nicotine-induced disorders: Secondary | ICD-10-CM | POA: Diagnosis not present

## 2018-11-17 DIAGNOSIS — B965 Pseudomonas (aeruginosa) (mallei) (pseudomallei) as the cause of diseases classified elsewhere: Secondary | ICD-10-CM | POA: Diagnosis not present

## 2018-11-17 DIAGNOSIS — E11622 Type 2 diabetes mellitus with other skin ulcer: Secondary | ICD-10-CM | POA: Diagnosis not present

## 2018-11-17 DIAGNOSIS — Z794 Long term (current) use of insulin: Secondary | ICD-10-CM | POA: Diagnosis not present

## 2018-11-17 DIAGNOSIS — J449 Chronic obstructive pulmonary disease, unspecified: Secondary | ICD-10-CM | POA: Diagnosis not present

## 2018-11-17 DIAGNOSIS — Z85118 Personal history of other malignant neoplasm of bronchus and lung: Secondary | ICD-10-CM | POA: Diagnosis not present

## 2018-11-17 DIAGNOSIS — Z9181 History of falling: Secondary | ICD-10-CM | POA: Diagnosis not present

## 2018-11-17 DIAGNOSIS — L97812 Non-pressure chronic ulcer of other part of right lower leg with fat layer exposed: Secondary | ICD-10-CM | POA: Diagnosis not present

## 2018-11-17 NOTE — Telephone Encounter (Signed)
Pt called and states that he is having pain in his left chest/rib area. Hurts when he presses on it but not with movement. No other CP, sob or other cardiac related issues.  Per Dr. Dennard Schaumann pt can use aspercream and take Tylenol 1000mg  bid. Pt'w wife aware.

## 2018-11-18 DIAGNOSIS — I872 Venous insufficiency (chronic) (peripheral): Secondary | ICD-10-CM | POA: Diagnosis not present

## 2018-11-18 DIAGNOSIS — L97812 Non-pressure chronic ulcer of other part of right lower leg with fat layer exposed: Secondary | ICD-10-CM | POA: Diagnosis not present

## 2018-11-18 DIAGNOSIS — I69359 Hemiplegia and hemiparesis following cerebral infarction affecting unspecified side: Secondary | ICD-10-CM | POA: Diagnosis not present

## 2018-11-18 DIAGNOSIS — F17210 Nicotine dependence, cigarettes, uncomplicated: Secondary | ICD-10-CM | POA: Diagnosis not present

## 2018-11-18 DIAGNOSIS — I69320 Aphasia following cerebral infarction: Secondary | ICD-10-CM | POA: Diagnosis not present

## 2018-11-18 DIAGNOSIS — E11622 Type 2 diabetes mellitus with other skin ulcer: Secondary | ICD-10-CM | POA: Diagnosis not present

## 2018-11-19 ENCOUNTER — Telehealth: Payer: Self-pay | Admitting: Neurology

## 2018-11-19 NOTE — Telephone Encounter (Signed)
Due to current COVID 19 pandemic, our office is severely reducing in office visits, in order to minimize the risk to our patients and healthcare providers.  Pt understands that although there may be some limitations with this type of visit, we will take all precautions to reduce any security or privacy concerns.  Pt understands that this will be treated like an in office visit and we will file with pt's insurance, and there may be a patient responsible charge related to this service. Pt's email is [email or web address]. Pt understands that the [employer] webex software must be downloaded and operational on the device pt plans to use for the visit. Pt understands that the nurse will be calling to go over pt's chart.

## 2018-11-19 NOTE — Telephone Encounter (Signed)
I called pt, spoke with pt and wife. Pt has never had a sleep study but does endorse snoring. Pt was instructed on how to measure his neck size prior to his appt. Pt is unsure of his weight but will weigh himself prior to the appt. Pt is 6'0. Pt's meds, allergies, and PMH were updated.   Epworth Sleepiness Scale 0= would never doze 1= slight chance of dozing 2= moderate chance of dozing 3= high chance of dozing  Sitting and reading: 2 Watching TV: 2 Sitting inactive in a public place (ex. Theater or meeting): 2 As a passenger in a car for an hour without a break:1 Lying down to rest in the afternoon: 2 Sitting and talking to someone: 1 Sitting quietly after lunch (no alcohol): 2 In a car, while stopped in traffic: 0 Total: 12  FSS: 46

## 2018-11-20 DIAGNOSIS — F17219 Nicotine dependence, cigarettes, with unspecified nicotine-induced disorders: Secondary | ICD-10-CM | POA: Diagnosis not present

## 2018-11-20 DIAGNOSIS — L97812 Non-pressure chronic ulcer of other part of right lower leg with fat layer exposed: Secondary | ICD-10-CM | POA: Diagnosis not present

## 2018-11-20 DIAGNOSIS — Z85118 Personal history of other malignant neoplasm of bronchus and lung: Secondary | ICD-10-CM | POA: Diagnosis not present

## 2018-11-20 DIAGNOSIS — E11622 Type 2 diabetes mellitus with other skin ulcer: Secondary | ICD-10-CM | POA: Diagnosis not present

## 2018-11-20 DIAGNOSIS — B965 Pseudomonas (aeruginosa) (mallei) (pseudomallei) as the cause of diseases classified elsewhere: Secondary | ICD-10-CM | POA: Diagnosis not present

## 2018-11-20 DIAGNOSIS — J449 Chronic obstructive pulmonary disease, unspecified: Secondary | ICD-10-CM | POA: Diagnosis not present

## 2018-11-20 DIAGNOSIS — Z9981 Dependence on supplemental oxygen: Secondary | ICD-10-CM | POA: Diagnosis not present

## 2018-11-20 DIAGNOSIS — Z794 Long term (current) use of insulin: Secondary | ICD-10-CM | POA: Diagnosis not present

## 2018-11-20 DIAGNOSIS — Z9181 History of falling: Secondary | ICD-10-CM | POA: Diagnosis not present

## 2018-11-24 DIAGNOSIS — F17219 Nicotine dependence, cigarettes, with unspecified nicotine-induced disorders: Secondary | ICD-10-CM | POA: Diagnosis not present

## 2018-11-24 DIAGNOSIS — Z85118 Personal history of other malignant neoplasm of bronchus and lung: Secondary | ICD-10-CM | POA: Diagnosis not present

## 2018-11-24 DIAGNOSIS — B965 Pseudomonas (aeruginosa) (mallei) (pseudomallei) as the cause of diseases classified elsewhere: Secondary | ICD-10-CM | POA: Diagnosis not present

## 2018-11-24 DIAGNOSIS — L97812 Non-pressure chronic ulcer of other part of right lower leg with fat layer exposed: Secondary | ICD-10-CM | POA: Diagnosis not present

## 2018-11-24 DIAGNOSIS — E11622 Type 2 diabetes mellitus with other skin ulcer: Secondary | ICD-10-CM | POA: Diagnosis not present

## 2018-11-24 DIAGNOSIS — J449 Chronic obstructive pulmonary disease, unspecified: Secondary | ICD-10-CM | POA: Diagnosis not present

## 2018-11-24 DIAGNOSIS — Z794 Long term (current) use of insulin: Secondary | ICD-10-CM | POA: Diagnosis not present

## 2018-11-24 DIAGNOSIS — Z9181 History of falling: Secondary | ICD-10-CM | POA: Diagnosis not present

## 2018-11-24 DIAGNOSIS — Z9981 Dependence on supplemental oxygen: Secondary | ICD-10-CM | POA: Diagnosis not present

## 2018-11-27 ENCOUNTER — Encounter (HOSPITAL_COMMUNITY): Payer: Medicare HMO

## 2018-11-27 ENCOUNTER — Ambulatory Visit: Payer: Medicare HMO | Admitting: Vascular Surgery

## 2018-11-27 DIAGNOSIS — Z85118 Personal history of other malignant neoplasm of bronchus and lung: Secondary | ICD-10-CM | POA: Diagnosis not present

## 2018-11-27 DIAGNOSIS — Z9981 Dependence on supplemental oxygen: Secondary | ICD-10-CM | POA: Diagnosis not present

## 2018-11-27 DIAGNOSIS — B965 Pseudomonas (aeruginosa) (mallei) (pseudomallei) as the cause of diseases classified elsewhere: Secondary | ICD-10-CM | POA: Diagnosis not present

## 2018-11-27 DIAGNOSIS — F17219 Nicotine dependence, cigarettes, with unspecified nicotine-induced disorders: Secondary | ICD-10-CM | POA: Diagnosis not present

## 2018-11-27 DIAGNOSIS — E11622 Type 2 diabetes mellitus with other skin ulcer: Secondary | ICD-10-CM | POA: Diagnosis not present

## 2018-11-27 DIAGNOSIS — Z9181 History of falling: Secondary | ICD-10-CM | POA: Diagnosis not present

## 2018-11-27 DIAGNOSIS — J449 Chronic obstructive pulmonary disease, unspecified: Secondary | ICD-10-CM | POA: Diagnosis not present

## 2018-11-27 DIAGNOSIS — L97812 Non-pressure chronic ulcer of other part of right lower leg with fat layer exposed: Secondary | ICD-10-CM | POA: Diagnosis not present

## 2018-11-27 DIAGNOSIS — Z794 Long term (current) use of insulin: Secondary | ICD-10-CM | POA: Diagnosis not present

## 2018-11-30 ENCOUNTER — Ambulatory Visit (INDEPENDENT_AMBULATORY_CARE_PROVIDER_SITE_OTHER): Payer: Medicare HMO | Admitting: Neurology

## 2018-11-30 ENCOUNTER — Other Ambulatory Visit: Payer: Self-pay

## 2018-11-30 ENCOUNTER — Encounter: Payer: Self-pay | Admitting: Neurology

## 2018-11-30 DIAGNOSIS — G4719 Other hypersomnia: Secondary | ICD-10-CM

## 2018-11-30 DIAGNOSIS — R0681 Apnea, not elsewhere classified: Secondary | ICD-10-CM | POA: Diagnosis not present

## 2018-11-30 DIAGNOSIS — F1190 Opioid use, unspecified, uncomplicated: Secondary | ICD-10-CM | POA: Diagnosis not present

## 2018-11-30 DIAGNOSIS — F17219 Nicotine dependence, cigarettes, with unspecified nicotine-induced disorders: Secondary | ICD-10-CM | POA: Diagnosis not present

## 2018-11-30 DIAGNOSIS — Z8673 Personal history of transient ischemic attack (TIA), and cerebral infarction without residual deficits: Secondary | ICD-10-CM

## 2018-11-30 DIAGNOSIS — Z9981 Dependence on supplemental oxygen: Secondary | ICD-10-CM | POA: Diagnosis not present

## 2018-11-30 DIAGNOSIS — J449 Chronic obstructive pulmonary disease, unspecified: Secondary | ICD-10-CM | POA: Diagnosis not present

## 2018-11-30 DIAGNOSIS — R0683 Snoring: Secondary | ICD-10-CM

## 2018-11-30 DIAGNOSIS — Z85118 Personal history of other malignant neoplasm of bronchus and lung: Secondary | ICD-10-CM | POA: Diagnosis not present

## 2018-11-30 DIAGNOSIS — B965 Pseudomonas (aeruginosa) (mallei) (pseudomallei) as the cause of diseases classified elsewhere: Secondary | ICD-10-CM | POA: Diagnosis not present

## 2018-11-30 DIAGNOSIS — L97812 Non-pressure chronic ulcer of other part of right lower leg with fat layer exposed: Secondary | ICD-10-CM | POA: Diagnosis not present

## 2018-11-30 DIAGNOSIS — E11622 Type 2 diabetes mellitus with other skin ulcer: Secondary | ICD-10-CM | POA: Diagnosis not present

## 2018-11-30 DIAGNOSIS — Z794 Long term (current) use of insulin: Secondary | ICD-10-CM | POA: Diagnosis not present

## 2018-11-30 DIAGNOSIS — Z9181 History of falling: Secondary | ICD-10-CM | POA: Diagnosis not present

## 2018-11-30 NOTE — Progress Notes (Signed)
Star Age, MD, PhD [facility] [address], [address] [place], [place] [address]   Virtual Visit via Video Note on 11/30/2018:  I connected with Brad Taylor on 11/30/18 at  1:00 PM EDT by a video enabled telemedicine application and verified that I am speaking with the correct person using two identifiers.   I discussed the limitations of evaluation and management by telemedicine and the availability of in person appointments. The patient expressed understanding and agreed to proceed.  History of Present Illness: Brad Taylor is a 68 year old right-handed gentleman with an underlying complex medical history of hyperlipidemia, depression, PTSD (by chart review), stroke, hypothyroidism, reflux disease, diabetes, COPD, recent smoking cessation, hypothyroidism, peripheral artery disease with balloon angioplasty as well as vascular atherectomy, lung cancer with partial right lung removal, chronic pain, on narcotic pain medication, and obesity, with whom I am conducting a virtual, video based visit via Webex today, in lieu of a face-to-face visit, for evaluation of his sleep disorder, in particular, concern for underlying obstructive sleep apnea.he reports snoring and excessive daytime somnolence as well as witnessed apneas per wife's report. The patient is accompanied by his wife today and joins via laptop from home, I am in my office. He is referred by his primary care physician, Dr. Jenna Luo. I reviewed his note from 10/28/2018.  His Epworth sleepiness score is 12 out of 24, fatigue severity score is 46 out of 63. Of note, he was recently admitted to the hospital for COPD exacerbation. He was admitted on 09/12/2018 and discharged on 09/15/2018. I reviewed the records. He was noted to have muscle jerking, in keeping with myoclonus, this was deemed secondary to hypoxia. He had an EEG which was negative. He subsequently saw Dr. Krista Blue in our office on 10/01/2018  and I reviewed her note. His bedtime is generally between 9 and 10, rise time around 6. He has nocturia about once or twice per average night and denies morning headaches. His son and his sister have sleep apnea, both used CPAP machines. He has no pets in the household. Drinks caffeine in the form of coffee, one cup in the morning and diet soda about 2-3 bottles per day on average. He has a TV in the bedroom but does not tend to watch it at night. He quit smoking about a year ago. He has residual dysarthria from his stroke as well as right-sided weakness, uses a cane sometimes. He denies any recent falls. He takes MS Contin for back pain and right leg pain, reports a diabetic ulcer on the right leg.   His Past Medical History Is Significant For: Past Medical History:  Diagnosis Date   AAA (abdominal aortic aneurysm) ([place]) 5/15   3.5x3.5 cm   COPD (chronic obstructive pulmonary disease) (HCC)    Depression    Diabetes mellitus without complication (HCC)    GERD (gastroesophageal reflux disease)    Hyperlipidemia    Hypothyroidism    Post traumatic stress disorder    Stroke [facility])    Thyroid disease    Ulcer     His Past Surgical History Is Significant For: Past Surgical History:  Procedure Laterality Date   ABDOMINAL AORTOGRAM W/LOWER EXTREMITY N/A 08/20/2017   Procedure: ABDOMINAL AORTOGRAM W/LOWER EXTREMITY;  Surgeon: Waynetta Sandy, MD;  Location: [place] [facility];  Service: Cardiovascular;  Laterality: N/A;   CATARACT EXTRACTION     CHOLECYSTECTOMY     ENDOVENOUS ABLATION SAPHENOUS VEIN W/ LASER Right 03/18/2018  endovenous laser ablation R GSV by Ruta Hinds MD    ENDOVENOUS ABLATION SAPHENOUS VEIN W/ LASER Right 04/08/2018   endovenous laser ablation R SSV by Ruta Hinds MD    Coffee, PARTIAL     right, 2 years ago at [facility] for cancer   PERIPHERAL VASCULAR ATHERECTOMY Right 08/20/2017   Procedure: PERIPHERAL VASCULAR  ATHERECTOMY;  Surgeon: Waynetta Sandy, MD;  Location: [place] [facility];  Service: Cardiovascular;  Laterality: Right;  posterior tibial and tibeoperoneal trunk   PERIPHERAL VASCULAR BALLOON ANGIOPLASTY Right 08/20/2017   Procedure: PERIPHERAL VASCULAR BALLOON ANGIOPLASTY;  Surgeon: Waynetta Sandy, MD;  Location: [place] [facility];  Service: Cardiovascular;  Laterality: Right;  Anterior tibial   VIDEO BRONCHOSCOPY Bilateral 12/30/2017   Procedure: VIDEO BRONCHOSCOPY WITHOUT FLUORO;  Surgeon: Marshell Garfinkel, MD;  Location: [place] [facility];  Service: Cardiopulmonary;  Laterality: Bilateral;    His Family History Is Significant For: Family History  Problem Relation Age of Onset   Emphysema Father    Thyroid disease Sister    Stroke Maternal Grandmother    Thyroid disease Sister    Thyroid disease Sister     His Social History Is Significant For: Social History   Socioeconomic History   Marital status: Married    Spouse name: Brad Taylor   Number of children: 1   Years of education: HS   Highest education level: Not on file  Occupational History   Occupation: Retired Dealer    Comment: Librarian, 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 on file   Food insecurity:    Worry: Not on file    Inability: Not on file   Transportation needs:    Medical: Not on file    Non-medical: Not on file  Tobacco Use   Smoking status: Current Every Day Smoker    Packs/day: 1.00    Years: 30.00    Pack years: 30.00    Types: Cigarettes   Smokeless tobacco: Former Systems developer    Quit date: 05/04/2013   Tobacco comment: Less than 1/2 pk per day  Substance and Sexual Activity   Alcohol use: No    Comment: Former heavy drinker, been sober for 10 years   Drug use: No   Sexual activity: Not on file  Lifestyle   Physical activity:    Days per week: Not on file    Minutes per session: Not on file   Stress: Not on file  Relationships   Social connections:     Talks on phone: Not on file    Gets together: Not on file    Attends religious service: Not on file    Active member of club or organization: Not on file    Attends meetings of clubs or organizations: Not on file    Relationship status: Not on file  Other Topics Concern   Not on file  Social History Narrative   Patient lives at home with spouse.   Caffeine Use: 3-4 cups daily   Retired Dealer.    His Allergies Are:  No Known Allergies:   His Current Medications Are:  Outpatient Encounter Medications as of 11/30/2018  Medication Sig   albuterol (PROVENTIL HFA;VENTOLIN HFA) 108 (90 Base) MCG/ACT inhaler INHALE 2 PUFFS INTO THE LUNGS EVERY 6 HOURS AS NEEDED FOR WHEEZE OR SHORTNESS OF BREATH   amphetamine-dextroamphetamine (ADDERALL XR) 15 MG 24 hr capsule Take 1 capsule by mouth every morning.   atorvastatin (LIPITOR)  10 MG tablet Take 10 mg by mouth every other day.    busPIRone (BUSPAR) 10 MG tablet Take 0.5 tablets (5 mg total) by mouth 2 (two) times daily as needed (for anxiety). (Patient taking differently: Take 10 mg by mouth 2 (two) times daily. )   clopidogrel (PLAVIX) 75 MG tablet Take 1 tablet (75 mg total) by mouth daily with breakfast.   doxycycline (VIBRAMYCIN) 100 MG capsule Take 100 mg by mouth 2 (two) times daily.   Fluticasone-Umeclidin-Vilant (TRELEGY ELLIPTA) 100-62.5-25 MCG/INH AEPB Inhale 1 Inhaler into the lungs daily.   gabapentin (NEURONTIN) 300 MG capsule Take 1 capsule (300 mg total) by mouth 3 (three) times daily. (Patient taking differently: Take 300 mg by mouth 2 (two) times daily. )   insulin glargine (LANTUS) 100 UNIT/ML injection Inject 45-55 Units into the skin 2 (two) times daily. Take 45 units in the morning and 55 units at night   levothyroxine (SYNTHROID, LEVOTHROID) 125 MCG tablet Take 1 tablet (125 mcg total) by mouth daily before breakfast.   metFORMIN (GLUCOPHAGE) 500 MG tablet Take 500 mg by mouth 2 (two) times daily with a meal.     morphine (MS CONTIN) 100 MG 12 hr tablet Take 100 mg by mouth every 12 (twelve) hours as needed for pain.    MOVANTIK 25 MG TABS tablet TAKE 1 TABLET BY MOUTH EVERY DAY   oxymetazoline (AFRIN) 0.05 % nasal spray Place 2 sprays into both nostrils 2 (two) times daily as needed for congestion.   RABEprazole (ACIPHEX) 20 MG tablet Take 2 tablets (40 mg total) by mouth daily before breakfast.   tamsulosin (FLOMAX) 0.4 MG CAPS capsule Take 0.4 mg by mouth at bedtime.    Tiotropium Bromide-Olodaterol (STIOLTO RESPIMAT) 2.5-2.5 MCG/ACT AERS Inhale 2 Inhalers into the lungs daily.   traZODone (DESYREL) 100 MG tablet Take 3 tablets (300 mg total) by mouth at bedtime.   venlafaxine XR (EFFEXOR-XR) 150 MG 24 hr capsule Take 1 capsule (150 mg total) by mouth daily with breakfast. (Patient taking differently: Take 150 mg by mouth 2 (two) times daily. )   No facility-administered encounter medications on file as of 11/30/2018.   :   Review of Systems:  Out of a complete 14 point review of systems, all are reviewed and negative with the exception of these symptoms as listed below:  Observations/Objective: His most recent vital signs for my review are from 10/28/2018: Blood pressure 140/78, pulse 60, temperature 90.8, weight 243 pounds for BMI of 34.13.  His most recent weight today by self-report is 244 pounds, neck circumference by self-report is 18.5 inches. On examination, he is in no acute distress, pleasant, conversant. He has mild dysarthria. Face is symmetric. Extraocular movements are preserved. Airway examination reveals a moderately crowded airway secondary to thicker soft palate and redundant soft palate. Tonsils are not fully visualized, Mallampati is to be class II. He has a mild underbite. Neck mobility is unremarkable. He reports right upper extremity weakness.   Assessment and Plan: Mr. Brad Taylor is a 68 year old right-handed gentleman with an underlying complex medical history  of hyperlipidemia, depression, PTSD (by chart review), stroke, hypothyroidism, reflux disease, diabetes, COPD, recent smoking cessation, hypothyroidism, peripheral artery disease with balloon angioplasty as well as vascular atherectomy, lung cancer with partial right lung removal, chronic pain, on narcotic pain medication, and obesity, with whom I am conducting a virtual, video based new patient visit via Webex in lieu of a face-to-face visit for evaluation of an underlying organic sleep  disorder, in particular, concern for obstructive sleep apnea. The patient's medical history and physical exam (albeit limited with current video-based evaluation) are concerning for a diagnosis of obstructive sleep apnea. I discussed with the patient the diagnosis of OSA, its prognosis and treatment options. I explained in particular the risks and ramifications of untreated moderate to severe OSA, especially with respect to developing cardiovascular disease down the Road, including congestive heart failure, difficult to treat hypertension, cardiac arrhythmias, or stroke. Even type 2 diabetes has, in part, been linked to untreated OSA. Symptoms of untreated OSA may include daytime sleepiness, memory problems, mood irritability and mood disorder such as depression and anxiety, lack of energy, as well as recurrent headaches, especially morning headaches. We talked about the importance of weight control. We talked about the importance of maintaining good sleep hygiene. I recommended the following at this time: home sleep test.  I explained the sleep test procedure to the patient and also outlined possible treatment options of OSA, including the use of a custom-made dental device (which would require a referral to a specialist dentist), upper airway surgical options, (such as UPPP, which would involve a referral to an ENT). I also explained the CPAP vs. AutoPAP treatment option to the patient, who indicated that he would be willing to  try CPAP if the need arises. I answered all their questions today and the patient was in agreement. I plan to see the patient back after the sleep study is completed and encouraged him to call with any interim questions, concerns, problems or updates.   Star Age, MD, PhD   Follow Up Instructions: 1. HST, sleep lab staff will reach out to patient to arrange for either sending the unit to home address or a "drive by pickup" and for tutorial, making sure patient is comfortable with the unit and usage, and return of equipment, if necessary.  2. Consider AutoPap therapy, if home sleep test positive for obstructive sleep apnea, patient agreeable. 3. We talked about alternative treatment options and current limitations, due to virus pandemic.  4. Follow-up after starting AutoPap therapy, follow-up to be scheduled according to set-up date, typically within 31 to 89 days post treatment start. 5. Pursue healthy lifestyle, good sleep hygiene, healthy weight. 6. Call for any interim questions or concerns.    I discussed the assessment and treatment plan with the patient. The patient was provided an opportunity to ask questions and all were answered. The patient agreed with the plan and demonstrated an understanding of the instructions.   The patient was advised to call back or seek an in-person evaluation if the symptoms worsen or if the condition fails to improve as anticipated.  I provided 30 minutes of non-face-to-face time during this encounter.   Star Age, MD

## 2018-11-30 NOTE — Patient Instructions (Signed)
Given verbally, during today's virtual video-based encounter, with verbal feedback received.   

## 2018-12-02 DIAGNOSIS — I69359 Hemiplegia and hemiparesis following cerebral infarction affecting unspecified side: Secondary | ICD-10-CM | POA: Diagnosis not present

## 2018-12-02 DIAGNOSIS — I69320 Aphasia following cerebral infarction: Secondary | ICD-10-CM | POA: Diagnosis not present

## 2018-12-02 DIAGNOSIS — I872 Venous insufficiency (chronic) (peripheral): Secondary | ICD-10-CM | POA: Diagnosis not present

## 2018-12-02 DIAGNOSIS — F17210 Nicotine dependence, cigarettes, uncomplicated: Secondary | ICD-10-CM | POA: Diagnosis not present

## 2018-12-02 DIAGNOSIS — L97812 Non-pressure chronic ulcer of other part of right lower leg with fat layer exposed: Secondary | ICD-10-CM | POA: Diagnosis not present

## 2018-12-02 DIAGNOSIS — E11622 Type 2 diabetes mellitus with other skin ulcer: Secondary | ICD-10-CM | POA: Diagnosis not present

## 2018-12-04 DIAGNOSIS — B965 Pseudomonas (aeruginosa) (mallei) (pseudomallei) as the cause of diseases classified elsewhere: Secondary | ICD-10-CM | POA: Diagnosis not present

## 2018-12-04 DIAGNOSIS — Z9981 Dependence on supplemental oxygen: Secondary | ICD-10-CM | POA: Diagnosis not present

## 2018-12-04 DIAGNOSIS — E11622 Type 2 diabetes mellitus with other skin ulcer: Secondary | ICD-10-CM | POA: Diagnosis not present

## 2018-12-04 DIAGNOSIS — J449 Chronic obstructive pulmonary disease, unspecified: Secondary | ICD-10-CM | POA: Diagnosis not present

## 2018-12-04 DIAGNOSIS — Z9181 History of falling: Secondary | ICD-10-CM | POA: Diagnosis not present

## 2018-12-04 DIAGNOSIS — L97812 Non-pressure chronic ulcer of other part of right lower leg with fat layer exposed: Secondary | ICD-10-CM | POA: Diagnosis not present

## 2018-12-04 DIAGNOSIS — Z85118 Personal history of other malignant neoplasm of bronchus and lung: Secondary | ICD-10-CM | POA: Diagnosis not present

## 2018-12-04 DIAGNOSIS — F17219 Nicotine dependence, cigarettes, with unspecified nicotine-induced disorders: Secondary | ICD-10-CM | POA: Diagnosis not present

## 2018-12-04 DIAGNOSIS — Z794 Long term (current) use of insulin: Secondary | ICD-10-CM | POA: Diagnosis not present

## 2018-12-07 DIAGNOSIS — B965 Pseudomonas (aeruginosa) (mallei) (pseudomallei) as the cause of diseases classified elsewhere: Secondary | ICD-10-CM | POA: Diagnosis not present

## 2018-12-07 DIAGNOSIS — Z9981 Dependence on supplemental oxygen: Secondary | ICD-10-CM | POA: Diagnosis not present

## 2018-12-07 DIAGNOSIS — Z794 Long term (current) use of insulin: Secondary | ICD-10-CM | POA: Diagnosis not present

## 2018-12-07 DIAGNOSIS — E11622 Type 2 diabetes mellitus with other skin ulcer: Secondary | ICD-10-CM | POA: Diagnosis not present

## 2018-12-07 DIAGNOSIS — Z85118 Personal history of other malignant neoplasm of bronchus and lung: Secondary | ICD-10-CM | POA: Diagnosis not present

## 2018-12-07 DIAGNOSIS — Z9181 History of falling: Secondary | ICD-10-CM | POA: Diagnosis not present

## 2018-12-07 DIAGNOSIS — J449 Chronic obstructive pulmonary disease, unspecified: Secondary | ICD-10-CM | POA: Diagnosis not present

## 2018-12-07 DIAGNOSIS — L97812 Non-pressure chronic ulcer of other part of right lower leg with fat layer exposed: Secondary | ICD-10-CM | POA: Diagnosis not present

## 2018-12-07 DIAGNOSIS — F17219 Nicotine dependence, cigarettes, with unspecified nicotine-induced disorders: Secondary | ICD-10-CM | POA: Diagnosis not present

## 2018-12-08 ENCOUNTER — Other Ambulatory Visit: Payer: Self-pay

## 2018-12-08 ENCOUNTER — Ambulatory Visit (INDEPENDENT_AMBULATORY_CARE_PROVIDER_SITE_OTHER): Payer: Medicare HMO | Admitting: Family Medicine

## 2018-12-08 DIAGNOSIS — R42 Dizziness and giddiness: Secondary | ICD-10-CM | POA: Diagnosis not present

## 2018-12-08 DIAGNOSIS — J0140 Acute pansinusitis, unspecified: Secondary | ICD-10-CM

## 2018-12-08 MED ORDER — AMOXICILLIN 875 MG PO TABS: 875.0000 mg | ORAL_TABLET | Freq: Two times a day (BID) | ORAL | 0 refills | Status: DC

## 2018-12-08 MED ORDER — FLUTICASONE PROPIONATE 50 MCG/ACT NA SUSP: 2.0000 | Freq: Every day | NASAL | 6 refills | Status: AC

## 2018-12-08 MED ORDER — MECLIZINE HCL 25 MG PO TABS: 25.0000 mg | ORAL_TABLET | Freq: Three times a day (TID) | ORAL | 0 refills | Status: AC | PRN

## 2018-12-08 NOTE — Progress Notes (Signed)
Subjective:    Patient ID: Brad Taylor, male    DOB: 28-Aug-1950, 68 y.o.   MRN: [id number]  HPI  Patient is seen today as a telephone visit.  Patient is at home.  I am currently in my office.  Patient consents to be seen over the telephone.  Phone call began at 220.  Phone call ended at 232.  Patient states that he has been dealing with sinus issues for over a week.  He reports pain and pressure in his frontal sinuses bilaterally.  He also reports itchy watery eyes and rhinorrhea.  He has a constant sinus headache that is a dull pressure-like pain that is moderate in intensity.  He denies any fever.  He denies any cough.  Starting yesterday, whenever he stands up, he feels dizzy.  I asked the patient to clarify.  He denies any syncope or near syncope.  However the room starts to spin whenever he stands up and he feels extremely nauseated.  He denies any ear pain.  He denies any sore throat.  He denies any cough.  He denies any irregular heartbeats.  He denies any palpitations.  He denies any chest pain or shortness of breath.  Pulse and heart rate have been running between 50 and 61 bpm.  Blood pressure has been 128-132/69-76. Past Medical History:  Diagnosis Date  . AAA (abdominal aortic aneurysm) ([place]) 5/15   3.5x3.5 cm  . COPD (chronic obstructive pulmonary disease) ([place])   . Depression   . Diabetes mellitus without complication ([place])   . GERD (gastroesophageal reflux disease)   . Hyperlipidemia   . Hypothyroidism   . Post traumatic stress disorder   . Stroke ([place])   . Thyroid disease   . Ulcer    Past Surgical History:  Procedure Laterality Date  . ABDOMINAL AORTOGRAM W/LOWER EXTREMITY N/A 08/20/2017   Procedure: ABDOMINAL AORTOGRAM W/LOWER EXTREMITY;  Surgeon: Waynetta Sandy, MD;  Location: [place] [facility];  Service: Cardiovascular;  Laterality: N/A;  . CATARACT EXTRACTION    . CHOLECYSTECTOMY    . ENDOVENOUS ABLATION SAPHENOUS VEIN W/ LASER Right 03/18/2018   endovenous laser ablation R GSV by Ruta Hinds MD   . ENDOVENOUS ABLATION SAPHENOUS VEIN W/ LASER Right 04/08/2018   endovenous laser ablation R SSV by Ruta Hinds MD   . [place]    . LUNG REMOVAL, PARTIAL     right, 2 years ago at [facility] for cancer  . PERIPHERAL VASCULAR ATHERECTOMY Right 08/20/2017   Procedure: PERIPHERAL VASCULAR ATHERECTOMY;  Surgeon: Waynetta Sandy, MD;  Location: [place] [facility];  Service: Cardiovascular;  Laterality: Right;  posterior tibial and tibeoperoneal trunk  . PERIPHERAL VASCULAR BALLOON ANGIOPLASTY Right 08/20/2017   Procedure: PERIPHERAL VASCULAR BALLOON ANGIOPLASTY;  Surgeon: Waynetta Sandy, MD;  Location: [place] [facility];  Service: Cardiovascular;  Laterality: Right;  Anterior tibial  . VIDEO BRONCHOSCOPY Bilateral 12/30/2017   Procedure: VIDEO BRONCHOSCOPY WITHOUT FLUORO;  Surgeon: Marshell Garfinkel, MD;  Location: [place] [facility];  Service: Cardiopulmonary;  Laterality: Bilateral;   Current Outpatient Medications on File Prior to Visit  Medication Sig Dispense Refill  . albuterol (PROVENTIL HFA;VENTOLIN HFA) 108 (90 Base) MCG/ACT inhaler INHALE 2 PUFFS INTO THE LUNGS EVERY 6 HOURS AS NEEDED FOR WHEEZE OR SHORTNESS OF BREATH 18 g 3  . amphetamine-dextroamphetamine (ADDERALL XR) 15 MG 24 hr capsule Take 1 capsule by mouth every morning. 30 capsule 0  . atorvastatin (LIPITOR) 10 MG tablet Take 10 mg by  mouth every other day.     . busPIRone (BUSPAR) 10 MG tablet Take 0.5 tablets (5 mg total) by mouth 2 (two) times daily as needed (for anxiety). (Patient taking differently: Take 10 mg by mouth 2 (two) times daily. ) 60 tablet 0  . clopidogrel (PLAVIX) 75 MG tablet Take 1 tablet (75 mg total) by mouth daily with breakfast. 90 tablet 3  . doxycycline (VIBRAMYCIN) 100 MG capsule Take 100 mg by mouth 2 (two) times daily.    . Fluticasone-Umeclidin-Vilant (TRELEGY ELLIPTA) 100-62.5-25 MCG/INH AEPB Inhale 1 Inhaler into the lungs daily. 1  each 11  . gabapentin (NEURONTIN) 300 MG capsule Take 1 capsule (300 mg total) by mouth 3 (three) times daily. (Patient taking differently: Take 300 mg by mouth 2 (two) times daily. ) 90 capsule 3  . insulin glargine (LANTUS) 100 UNIT/ML injection Inject 45-55 Units into the skin 2 (two) times daily. Take 45 units in the morning and 55 units at night    . levothyroxine (SYNTHROID, LEVOTHROID) 125 MCG tablet Take 1 tablet (125 mcg total) by mouth daily before breakfast. 30 tablet 1  . metFORMIN (GLUCOPHAGE) 500 MG tablet Take 500 mg by mouth 2 (two) times daily with a meal.    . morphine (MS CONTIN) 100 MG 12 hr tablet Take 100 mg by mouth every 12 (twelve) hours as needed for pain.     Marland Kitchen MOVANTIK 25 MG TABS tablet TAKE 1 TABLET BY MOUTH EVERY DAY 30 tablet 5  . oxymetazoline (AFRIN) 0.05 % nasal spray Place 2 sprays into both nostrils 2 (two) times daily as needed for congestion.    . RABEprazole (ACIPHEX) 20 MG tablet Take 2 tablets (40 mg total) by mouth daily before breakfast. 30 tablet 1  . tamsulosin (FLOMAX) 0.4 MG CAPS capsule Take 0.4 mg by mouth at bedtime.     . Tiotropium Bromide-Olodaterol (STIOLTO RESPIMAT) 2.5-2.5 MCG/ACT AERS Inhale 2 Inhalers into the lungs daily. 12 g 3  . traZODone (DESYREL) 100 MG tablet Take 3 tablets (300 mg total) by mouth at bedtime. 30 tablet 0  . venlafaxine XR (EFFEXOR-XR) 150 MG 24 hr capsule Take 1 capsule (150 mg total) by mouth daily with breakfast. (Patient taking differently: Take 150 mg by mouth 2 (two) times daily. ) 30 capsule 1   No current facility-administered medications on file prior to visit.    No Known Allergies Social History   Socioeconomic History  . Marital status: Married    Spouse name: Diane  . Number of children: 1  . Years of education: HS  . Highest education level: Not on file  Occupational History  . Occupation: Retired Dealer    Comment: [employer]  Social Needs  . Financial resource strain: Not on file  . Food  insecurity:    Worry: Not on file    Inability: Not on file  . Transportation needs:    Medical: Not on file    Non-medical: Not on file  Tobacco Use  . Smoking status: Current Every Day Smoker    Packs/day: 1.00    Years: 30.00    Pack years: 30.00    Types: Cigarettes  . Smokeless tobacco: Former Systems developer    Quit date: 05/04/2013  . Tobacco comment: Less than 1/2 pk per day  Substance and Sexual Activity  . Alcohol use: No    Comment: Former heavy drinker, been sober for 10 years  . Drug use: No  . Sexual activity: Not on file  Lifestyle  . Physical activity:    Days per week: Not on file    Minutes per session: Not on file  . Stress: Not on file  Relationships  . Social connections:    Talks on phone: Not on file    Gets together: Not on file    Attends religious service: Not on file    Active member of club or organization: Not on file    Attends meetings of clubs or organizations: Not on file    Relationship status: Not on file  . Intimate partner violence:    Fear of current or ex partner: Not on file    Emotionally abused: Not on file    Physically abused: Not on file    Forced sexual activity: Not on file  Other Topics Concern  . Not on file  Social History Narrative   Patient lives at home with spouse.   Caffeine Use: 3-4 cups daily   Retired Dealer.     Review of Systems  All other systems reviewed and are negative.      Objective:   Physical Exam I spoke to the patient over the telephone.  Patient was calm mentating appropriately in no respiratory distress.  He was not coughing or ill.       Assessment & Plan:  Acute pansinusitis, recurrence not specified  Vertigo  Patient's dizziness sounds like positional vertigo.  I would recommend meclizine 25 mg every 8 hours as needed for vertigo.  It also sounds like he may have frontal sinusitis.  I will treat this with combination of amoxicillin 875 mg twice daily for 10 days and Flonase 2 sprays each  nostril daily.  Reassess in 1 week if no better or sooner if symptoms change or worsen

## 2018-12-09 ENCOUNTER — Encounter (HOSPITAL_BASED_OUTPATIENT_CLINIC_OR_DEPARTMENT_OTHER): Payer: Medicare HMO | Attending: Physician Assistant

## 2018-12-09 DIAGNOSIS — J449 Chronic obstructive pulmonary disease, unspecified: Secondary | ICD-10-CM | POA: Insufficient documentation

## 2018-12-09 DIAGNOSIS — F17210 Nicotine dependence, cigarettes, uncomplicated: Secondary | ICD-10-CM | POA: Insufficient documentation

## 2018-12-09 DIAGNOSIS — I69359 Hemiplegia and hemiparesis following cerebral infarction affecting unspecified side: Secondary | ICD-10-CM | POA: Insufficient documentation

## 2018-12-09 DIAGNOSIS — E11622 Type 2 diabetes mellitus with other skin ulcer: Secondary | ICD-10-CM | POA: Insufficient documentation

## 2018-12-09 DIAGNOSIS — L97812 Non-pressure chronic ulcer of other part of right lower leg with fat layer exposed: Secondary | ICD-10-CM | POA: Insufficient documentation

## 2018-12-09 DIAGNOSIS — I872 Venous insufficiency (chronic) (peripheral): Secondary | ICD-10-CM | POA: Insufficient documentation

## 2018-12-09 DIAGNOSIS — I69920 Aphasia following unspecified cerebrovascular disease: Secondary | ICD-10-CM | POA: Insufficient documentation

## 2018-12-11 DIAGNOSIS — Z85118 Personal history of other malignant neoplasm of bronchus and lung: Secondary | ICD-10-CM | POA: Diagnosis not present

## 2018-12-11 DIAGNOSIS — J449 Chronic obstructive pulmonary disease, unspecified: Secondary | ICD-10-CM | POA: Diagnosis not present

## 2018-12-11 DIAGNOSIS — B965 Pseudomonas (aeruginosa) (mallei) (pseudomallei) as the cause of diseases classified elsewhere: Secondary | ICD-10-CM | POA: Diagnosis not present

## 2018-12-11 DIAGNOSIS — Z794 Long term (current) use of insulin: Secondary | ICD-10-CM | POA: Diagnosis not present

## 2018-12-11 DIAGNOSIS — Z9981 Dependence on supplemental oxygen: Secondary | ICD-10-CM | POA: Diagnosis not present

## 2018-12-11 DIAGNOSIS — Z9181 History of falling: Secondary | ICD-10-CM | POA: Diagnosis not present

## 2018-12-11 DIAGNOSIS — L97812 Non-pressure chronic ulcer of other part of right lower leg with fat layer exposed: Secondary | ICD-10-CM | POA: Diagnosis not present

## 2018-12-11 DIAGNOSIS — E11622 Type 2 diabetes mellitus with other skin ulcer: Secondary | ICD-10-CM | POA: Diagnosis not present

## 2018-12-11 DIAGNOSIS — F17219 Nicotine dependence, cigarettes, with unspecified nicotine-induced disorders: Secondary | ICD-10-CM | POA: Diagnosis not present

## 2018-12-14 DIAGNOSIS — J441 Chronic obstructive pulmonary disease with (acute) exacerbation: Secondary | ICD-10-CM | POA: Diagnosis not present

## 2018-12-14 DIAGNOSIS — B965 Pseudomonas (aeruginosa) (mallei) (pseudomallei) as the cause of diseases classified elsewhere: Secondary | ICD-10-CM | POA: Diagnosis not present

## 2018-12-14 DIAGNOSIS — E11622 Type 2 diabetes mellitus with other skin ulcer: Secondary | ICD-10-CM | POA: Diagnosis not present

## 2018-12-14 DIAGNOSIS — J209 Acute bronchitis, unspecified: Secondary | ICD-10-CM | POA: Diagnosis not present

## 2018-12-14 DIAGNOSIS — Z9181 History of falling: Secondary | ICD-10-CM | POA: Diagnosis not present

## 2018-12-14 DIAGNOSIS — Z9981 Dependence on supplemental oxygen: Secondary | ICD-10-CM | POA: Diagnosis not present

## 2018-12-14 DIAGNOSIS — J449 Chronic obstructive pulmonary disease, unspecified: Secondary | ICD-10-CM | POA: Diagnosis not present

## 2018-12-14 DIAGNOSIS — Z85118 Personal history of other malignant neoplasm of bronchus and lung: Secondary | ICD-10-CM | POA: Diagnosis not present

## 2018-12-14 DIAGNOSIS — Z794 Long term (current) use of insulin: Secondary | ICD-10-CM | POA: Diagnosis not present

## 2018-12-14 DIAGNOSIS — F17219 Nicotine dependence, cigarettes, with unspecified nicotine-induced disorders: Secondary | ICD-10-CM | POA: Diagnosis not present

## 2018-12-14 DIAGNOSIS — J440 Chronic obstructive pulmonary disease with acute lower respiratory infection: Secondary | ICD-10-CM | POA: Diagnosis not present

## 2018-12-14 DIAGNOSIS — L97812 Non-pressure chronic ulcer of other part of right lower leg with fat layer exposed: Secondary | ICD-10-CM | POA: Diagnosis not present

## 2018-12-16 DIAGNOSIS — J449 Chronic obstructive pulmonary disease, unspecified: Secondary | ICD-10-CM | POA: Diagnosis not present

## 2018-12-16 DIAGNOSIS — I69920 Aphasia following unspecified cerebrovascular disease: Secondary | ICD-10-CM | POA: Diagnosis not present

## 2018-12-16 DIAGNOSIS — E11622 Type 2 diabetes mellitus with other skin ulcer: Secondary | ICD-10-CM | POA: Diagnosis not present

## 2018-12-16 DIAGNOSIS — F17210 Nicotine dependence, cigarettes, uncomplicated: Secondary | ICD-10-CM | POA: Diagnosis not present

## 2018-12-16 DIAGNOSIS — I69359 Hemiplegia and hemiparesis following cerebral infarction affecting unspecified side: Secondary | ICD-10-CM | POA: Diagnosis not present

## 2018-12-16 DIAGNOSIS — I872 Venous insufficiency (chronic) (peripheral): Secondary | ICD-10-CM | POA: Diagnosis not present

## 2018-12-16 DIAGNOSIS — L97812 Non-pressure chronic ulcer of other part of right lower leg with fat layer exposed: Secondary | ICD-10-CM | POA: Diagnosis not present

## 2018-12-18 DIAGNOSIS — F17219 Nicotine dependence, cigarettes, with unspecified nicotine-induced disorders: Secondary | ICD-10-CM | POA: Diagnosis not present

## 2018-12-18 DIAGNOSIS — E11622 Type 2 diabetes mellitus with other skin ulcer: Secondary | ICD-10-CM | POA: Diagnosis not present

## 2018-12-18 DIAGNOSIS — Z9181 History of falling: Secondary | ICD-10-CM | POA: Diagnosis not present

## 2018-12-18 DIAGNOSIS — Z9981 Dependence on supplemental oxygen: Secondary | ICD-10-CM | POA: Diagnosis not present

## 2018-12-18 DIAGNOSIS — J449 Chronic obstructive pulmonary disease, unspecified: Secondary | ICD-10-CM | POA: Diagnosis not present

## 2018-12-18 DIAGNOSIS — L97812 Non-pressure chronic ulcer of other part of right lower leg with fat layer exposed: Secondary | ICD-10-CM | POA: Diagnosis not present

## 2018-12-18 DIAGNOSIS — B965 Pseudomonas (aeruginosa) (mallei) (pseudomallei) as the cause of diseases classified elsewhere: Secondary | ICD-10-CM | POA: Diagnosis not present

## 2018-12-18 DIAGNOSIS — Z794 Long term (current) use of insulin: Secondary | ICD-10-CM | POA: Diagnosis not present

## 2018-12-18 DIAGNOSIS — Z85118 Personal history of other malignant neoplasm of bronchus and lung: Secondary | ICD-10-CM | POA: Diagnosis not present

## 2018-12-21 DIAGNOSIS — Z20828 Contact with and (suspected) exposure to other viral communicable diseases: Secondary | ICD-10-CM | POA: Diagnosis not present

## 2018-12-22 ENCOUNTER — Other Ambulatory Visit: Payer: Self-pay

## 2018-12-22 ENCOUNTER — Ambulatory Visit (INDEPENDENT_AMBULATORY_CARE_PROVIDER_SITE_OTHER): Payer: Medicare HMO | Admitting: Family Medicine

## 2018-12-22 DIAGNOSIS — R1032 Left lower quadrant pain: Secondary | ICD-10-CM

## 2018-12-22 MED ORDER — METRONIDAZOLE 500 MG PO TABS: 500.0000 mg | ORAL_TABLET | Freq: Two times a day (BID) | ORAL | 0 refills | Status: DC

## 2018-12-22 MED ORDER — CIPROFLOXACIN HCL 500 MG PO TABS: 500.0000 mg | ORAL_TABLET | Freq: Two times a day (BID) | ORAL | 0 refills | Status: DC

## 2018-12-22 NOTE — Progress Notes (Signed)
Subjective:    Patient ID: Brad Taylor, male    DOB: [birth year], 68 y.o.   MRN: [id number]  HPI Is being seen today as a telephone visit.  Phone call began at 10:00.  Phone call ended at 1015.  Patient is at home with his wife.  I am currently in my office.  Patient consents to be seen by telephone.  Patient reports 2 to 3 days of abdominal pain and nausea.  He denies any objective fevers but he has been having some chills.  When I asked him to locate the abdominal pain, his wife states that the left lower quadrant and left middle abdomen.  He is tender to palpation in that area however there is no apparent guarding or rebound.  He denies any blood in stool.  He denies any melena.  He does have nausea but he denies any vomiting or hematemesis.  He has not had a bowel movement in 2 days however his wife states this is not uncommon for him.  He is on Movantik due to high-dose chronic opioids.  He sometimes goes 3 to 5 days without a bowel movement so this is normal for him.  He denies any dysuria or hematuria or urgency or frequency.  He denies any cough or shortness of breath.  Differential diagnosis includes severe constipation causing left lower quadrant abdominal pain or diverticulitis. Past Medical History:  Diagnosis Date  . AAA (abdominal aortic aneurysm) ([place]) 5/15   3.5x3.5 cm  . COPD (chronic obstructive pulmonary disease) ([place])   . Depression   . Diabetes mellitus without complication ([place])   . GERD (gastroesophageal reflux disease)   . Hyperlipidemia   . Hypothyroidism   . Post traumatic stress disorder   . Stroke ([place])   . Thyroid disease   . Ulcer    Past Surgical History:  Procedure Laterality Date  . ABDOMINAL AORTOGRAM W/LOWER EXTREMITY N/A 08/20/2017   Procedure: ABDOMINAL AORTOGRAM W/LOWER EXTREMITY;  Surgeon: Waynetta Sandy, MD;  Location: [facility];  Service: Cardiovascular;  Laterality: N/A;  . CATARACT EXTRACTION    . CHOLECYSTECTOMY    .  ENDOVENOUS ABLATION SAPHENOUS VEIN W/ LASER Right 03/18/2018   endovenous laser ablation R [facility] by Ruta Hinds MD   . ENDOVENOUS ABLATION SAPHENOUS VEIN W/ LASER Right 04/08/2018   endovenous laser ablation R [facility] by Ruta Hinds MD   . [place]    . LUNG REMOVAL, PARTIAL     right, 2 years ago at [facility] for cancer  . PERIPHERAL VASCULAR ATHERECTOMY Right 08/20/2017   Procedure: PERIPHERAL VASCULAR ATHERECTOMY;  Surgeon: Waynetta Sandy, MD;  Location: [facility];  Service: Cardiovascular;  Laterality: Right;  posterior tibial and tibeoperoneal trunk  . PERIPHERAL VASCULAR BALLOON ANGIOPLASTY Right 08/20/2017   Procedure: PERIPHERAL VASCULAR BALLOON ANGIOPLASTY;  Surgeon: Waynetta Sandy, MD;  Location: [facility];  Service: Cardiovascular;  Laterality: Right;  Anterior tibial  . VIDEO BRONCHOSCOPY Bilateral 12/30/2017   Procedure: VIDEO BRONCHOSCOPY WITHOUT FLUORO;  Surgeon: Marshell Garfinkel, MD;  Location: [facility];  Service: Cardiopulmonary;  Laterality: Bilateral;   Current Outpatient Medications on File Prior to Visit  Medication Sig Dispense Refill  . albuterol (PROVENTIL HFA;VENTOLIN HFA) 108 (90 Base) MCG/ACT inhaler INHALE 2 PUFFS INTO THE LUNGS EVERY 6 HOURS AS NEEDED FOR WHEEZE OR SHORTNESS OF BREATH 18 g 3  . amoxicillin (AMOXIL) 875 MG tablet Take 1 tablet (875 mg total) by mouth 2 (two) times daily.  20 tablet 0  . amphetamine-dextroamphetamine (ADDERALL XR) 15 MG 24 hr capsule Take 1 capsule by mouth every morning. 30 capsule 0  . atorvastatin (LIPITOR) 10 MG tablet Take 10 mg by mouth every other day.     . busPIRone (BUSPAR) 10 MG tablet Take 0.5 tablets (5 mg total) by mouth 2 (two) times daily as needed (for anxiety). (Patient taking differently: Take 10 mg by mouth 2 (two) times daily. ) 60 tablet 0  . clopidogrel (PLAVIX) 75 MG tablet Take 1 tablet (75 mg total) by mouth daily with breakfast. 90 tablet 3  . doxycycline (VIBRAMYCIN) 100  MG capsule Take 100 mg by mouth 2 (two) times daily.    . fluticasone (FLONASE) 50 MCG/ACT nasal spray Place 2 sprays into both nostrils daily. 16 g 6  . Fluticasone-Umeclidin-Vilant (TRELEGY ELLIPTA) 100-62.5-25 MCG/INH AEPB Inhale 1 Inhaler into the lungs daily. 1 each 11  . gabapentin (NEURONTIN) 300 MG capsule Take 1 capsule (300 mg total) by mouth 3 (three) times daily. (Patient taking differently: Take 300 mg by mouth 2 (two) times daily. ) 90 capsule 3  . insulin glargine (LANTUS) 100 UNIT/ML injection Inject 45-55 Units into the skin 2 (two) times daily. Take 45 units in the morning and 55 units at night    . levothyroxine (SYNTHROID, LEVOTHROID) 125 MCG tablet Take 1 tablet (125 mcg total) by mouth daily before breakfast. 30 tablet 1  . meclizine (ANTIVERT) 25 MG tablet Take 1 tablet (25 mg total) by mouth 3 (three) times daily as needed for dizziness (vertigo). 30 tablet 0  . metFORMIN (GLUCOPHAGE) 500 MG tablet Take 500 mg by mouth 2 (two) times daily with a meal.    . morphine (MS CONTIN) 100 MG 12 hr tablet Take 100 mg by mouth every 12 (twelve) hours as needed for pain.     Marland Kitchen MOVANTIK 25 MG TABS tablet TAKE 1 TABLET BY MOUTH EVERY DAY 30 tablet 5  . oxymetazoline (AFRIN) 0.05 % nasal spray Place 2 sprays into both nostrils 2 (two) times daily as needed for congestion.    . RABEprazole (ACIPHEX) 20 MG tablet Take 2 tablets (40 mg total) by mouth daily before breakfast. 30 tablet 1  . tamsulosin (FLOMAX) 0.4 MG CAPS capsule Take 0.4 mg by mouth at bedtime.     . Tiotropium Bromide-Olodaterol (STIOLTO RESPIMAT) 2.5-2.5 MCG/ACT AERS Inhale 2 Inhalers into the lungs daily. 12 g 3  . traZODone (DESYREL) 100 MG tablet Take 3 tablets (300 mg total) by mouth at bedtime. 30 tablet 0  . venlafaxine XR (EFFEXOR-XR) 150 MG 24 hr capsule Take 1 capsule (150 mg total) by mouth daily with breakfast. (Patient taking differently: Take 150 mg by mouth 2 (two) times daily. ) 30 capsule 1   No current  facility-administered medications on file prior to visit.    No Known Allergies Social History   Socioeconomic History  . Marital status: Married    Spouse name: Diane  . Number of children: 1  . Years of education: HS  . Highest education level: Not on file  Occupational History  . Occupation: Retired Dealer    Comment: [employer]  Social Needs  . Financial resource strain: Not on file  . Food insecurity:    Worry: Not on file    Inability: Not on file  . Transportation needs:    Medical: Not on file    Non-medical: Not on file  Tobacco Use  . Smoking status: Current Every Day  Smoker    Packs/day: 1.00    Years: 30.00    Pack years: 30.00    Types: Cigarettes  . Smokeless tobacco: Former Systems developer    Quit date: 05/04/2013  . Tobacco comment: Less than 1/2 pk per day  Substance and Sexual Activity  . Alcohol use: No    Comment: Former heavy drinker, been sober for 10 years  . Drug use: No  . Sexual activity: Not on file  Lifestyle  . Physical activity:    Days per week: Not on file    Minutes per session: Not on file  . Stress: Not on file  Relationships  . Social connections:    Talks on phone: Not on file    Gets together: Not on file    Attends religious service: Not on file    Active member of club or organization: Not on file    Attends meetings of clubs or organizations: Not on file    Relationship status: Not on file  . Intimate partner violence:    Fear of current or ex partner: Not on file    Emotionally abused: Not on file    Physically abused: Not on file    Forced sexual activity: Not on file  Other Topics Concern  . Not on file  Social History Narrative   Patient lives at home with spouse.   Caffeine Use: 3-4 cups daily   Retired Dealer.      Review of Systems  All other systems reviewed and are negative.      Objective:   Physical Exam  Physical exam could not be performed today as the patient was seen as a telephone visit.  I  explained to both the patient and wife I cannot be certain of his diagnosis over the telephone however he does not want to come to the office due to concern over COVID-19 given his multiple medical problems.  Wife states that he is mildly tender to palpation in the left lower quadrant based on her description      Assessment & Plan:  LLQ abdominal pain - Plan: metroNIDAZOLE (FLAGYL) 500 MG tablet, ciprofloxacin (CIPRO) 500 MG tablet  I suspect the patient may have either diverticulitis or possible severe constipation.  I recommended he continue the Movantik and add MiraLAX 17 g twice daily.  Did not start Flagyl 500 mg p.o. twice daily and Cipro 500 mg p.o. twice daily for 10 days.  Recheck in 24 to 48 hours or sooner if worse.  Go to the hospital if severe pain develops for imaging.  Both the patient and his wife are comfortable with this plan

## 2018-12-23 DIAGNOSIS — E11622 Type 2 diabetes mellitus with other skin ulcer: Secondary | ICD-10-CM | POA: Diagnosis not present

## 2018-12-23 DIAGNOSIS — I872 Venous insufficiency (chronic) (peripheral): Secondary | ICD-10-CM | POA: Diagnosis not present

## 2018-12-23 DIAGNOSIS — J449 Chronic obstructive pulmonary disease, unspecified: Secondary | ICD-10-CM | POA: Diagnosis not present

## 2018-12-23 DIAGNOSIS — I69920 Aphasia following unspecified cerebrovascular disease: Secondary | ICD-10-CM | POA: Diagnosis not present

## 2018-12-23 DIAGNOSIS — F17210 Nicotine dependence, cigarettes, uncomplicated: Secondary | ICD-10-CM | POA: Diagnosis not present

## 2018-12-23 DIAGNOSIS — I69359 Hemiplegia and hemiparesis following cerebral infarction affecting unspecified side: Secondary | ICD-10-CM | POA: Diagnosis not present

## 2018-12-23 DIAGNOSIS — L97812 Non-pressure chronic ulcer of other part of right lower leg with fat layer exposed: Secondary | ICD-10-CM | POA: Diagnosis not present

## 2018-12-24 ENCOUNTER — Other Ambulatory Visit: Payer: Self-pay

## 2018-12-24 ENCOUNTER — Encounter: Payer: Self-pay | Admitting: Family Medicine

## 2018-12-24 ENCOUNTER — Ambulatory Visit (INDEPENDENT_AMBULATORY_CARE_PROVIDER_SITE_OTHER): Payer: Medicare HMO | Admitting: Family Medicine

## 2018-12-24 VITALS — BP 100/60 | HR 54 | Temp 97.9°F | Resp 20 | Ht 70.75 in | Wt 236.0 lb

## 2018-12-24 DIAGNOSIS — I5032 Chronic diastolic (congestive) heart failure: Secondary | ICD-10-CM | POA: Diagnosis not present

## 2018-12-24 DIAGNOSIS — R1032 Left lower quadrant pain: Secondary | ICD-10-CM

## 2018-12-24 DIAGNOSIS — G4710 Hypersomnia, unspecified: Secondary | ICD-10-CM | POA: Diagnosis not present

## 2018-12-24 DIAGNOSIS — J209 Acute bronchitis, unspecified: Secondary | ICD-10-CM | POA: Diagnosis not present

## 2018-12-24 DIAGNOSIS — I739 Peripheral vascular disease, unspecified: Secondary | ICD-10-CM | POA: Insufficient documentation

## 2018-12-24 DIAGNOSIS — J441 Chronic obstructive pulmonary disease with (acute) exacerbation: Secondary | ICD-10-CM | POA: Diagnosis not present

## 2018-12-24 DIAGNOSIS — R5382 Chronic fatigue, unspecified: Secondary | ICD-10-CM

## 2018-12-24 DIAGNOSIS — R232 Flushing: Secondary | ICD-10-CM | POA: Diagnosis not present

## 2018-12-24 DIAGNOSIS — J449 Chronic obstructive pulmonary disease, unspecified: Secondary | ICD-10-CM | POA: Diagnosis not present

## 2018-12-24 DIAGNOSIS — I712 Thoracic aortic aneurysm, without rupture, unspecified: Secondary | ICD-10-CM | POA: Insufficient documentation

## 2018-12-24 MED ORDER — CLONIDINE 0.1 MG/24HR TD PTWK: 0.1000 mg | MEDICATED_PATCH | TRANSDERMAL | 12 refills | Status: DC

## 2018-12-24 NOTE — Progress Notes (Signed)
Subjective:    Patient ID: Brad Taylor, male    DOB: 10-10-1950, 68 y.o.   MRN: [id number]  HPI  12/22/18 Is being seen today as a telephone visit.  Phone call began at 10:00.  Phone call ended at 1015.  Patient is at home with his wife.  I am currently in my office.  Patient consents to be seen by telephone.  Patient reports 2 to 3 days of abdominal pain and nausea.  He denies any objective fevers but he has been having some chills.  When I asked him to locate the abdominal pain, his wife states that the left lower quadrant and left middle abdomen.  He is tender to palpation in that area however there is no apparent guarding or rebound.  He denies any blood in stool.  He denies any melena.  He does have nausea but he denies any vomiting or hematemesis.  He has not had a bowel movement in 2 days however his wife states this is not uncommon for him.  He is on Movantik due to high-dose chronic opioids.  He sometimes goes 3 to 5 days without a bowel movement so this is normal for him.  He denies any dysuria or hematuria or urgency or frequency.  He denies any cough or shortness of breath.  Differential diagnosis includes severe constipation causing left lower quadrant abdominal pain or diverticulitis.  At that time, my plan was: I suspect the patient may have either diverticulitis or possible severe constipation.  I recommended he continue the Movantik and add MiraLAX 17 g twice daily.  Did not start Flagyl 500 mg p.o. twice daily and Cipro 500 mg p.o. twice daily for 10 days.  Recheck in 24 to 48 hours or sooner if worse.  Go to the hospital if severe pain develops for imaging.  Both the patient and his wife are comfortable with this plan  12/24/18 Wt Readings from Last 3 Encounters:  12/24/18 236 lb (107 kg)  10/28/18 243 lb (110.2 kg)  10/01/18 246 lb 8 oz (111.8 kg)    While hospitalized in February, the patient had a CT angiogram of the chest and abdomen and pelvis.  I have copied relevant  portions of this below for reference: CTA CHEST  1. No evidence of acute pulmonary embolus, pneumonia or other acute cardiopulmonary process. 2. Surgical changes of prior right upper lobectomy with residual scarring but no convincing evidence of recurrent disease. 3. Enlarged main pulmonary artery suggesting underlying pulmonary arterial hypertension. 4. Stable thoracic aortic aneurysm with a maximal diameter of 4.3 cm. Recommend annual imaging followup by CTA or MRA. This recommendation follows 2010 ACCF/AHA/AATS/ACR/ASA/SCA/SCAI/SIR/STS/SVM Guidelines for the Diagnosis and Management of Patients with Thoracic Aortic Disease. Circulation. 2010; 121: H474-Q595. Aortic aneurysm NOS (ICD10-I71.9). 5. Thickened and calcified aortic valve raises concern for underlying aortic valvular stenosis. 6. Cardiomegaly with coronary artery disease.  CTA ABD/PELVIS  1. No acute abnormality within the abdomen or pelvis. 2. Extensive artifact related to patient motion and streak from arms down positioning. 3. Large colonic stool burden suggesting constipation. 4. Stable mild aneurysmal dilatation of the infrarenal abdominal aorta at 3.3 cm. Recommend followup by ultrasound in 3 years. This recommendation follows ACR consensus guidelines: White Paper of the ACR Incidental Findings Committee II on Vascular Findings. J Am Coll Radiol 2013; 10:789-794. 5. Slight interval progression (compared to 2015) of bilateral common iliac artery aneurysms measuring up to 2.6 cm on the left and 2.8 cm on the right. 6.  Extensive peripheral arterial disease in the visualized iliac and femoral arteries. Could patient's lower extremity symptoms be secondary to arterial insufficiency   Patient presents today complaining that he does not feel well.  However it is very difficult for him to put into words exactly what he means.  He repeats I just do not feel good.  Patient's abdominal pain has subsided.  He has no  tenderness to palpation in the left lower quadrant.  His abdomen is soft nondistended with normal bowel sounds.  His wife states that his abdominal pain has improved since he started taking antibiotics.  He is also had bowel movements for the last 3 days in a row.  However he still feels poorly.  When I asked him to be more specific, the patient repeats about having hot and cold flashes.  Please see the copied previous office visit I have included below for reference regarding this problem:  09/19/17 Patient is here today with his wife complaining of severe fatigue.  He states that he has no energy.  He denies any chest pain or shortness of breath.  He reports very little strength or stamina or desire to do anything.  He also reports hot flashes.  He is checked his temperature and they are not fevers.  His wound on his leg is improving under the care of a wound clinic.  His lab workup previously did show significant hypogonadism with a total testosterone level of 84.  However he is falling asleep during our office visit.  He dozes off as I talk to him and has to startle himself back awake.  I explained to the patient that this would not be due to hypogonadism and most likely suggests sleep apnea versus narcolepsy.  He states that in the remote past he had a sleep study that diagnosed him with sleep apnea however he is receiving no treatment.  He reports feeling sleepy and tired all the time.  He can easily fall asleep during conversations, after lunch, reading a book, watching TV, riding in a car.  He falls asleep constantly.  His Epworth sleepiness score would therefore be over 18!  At that time, my plan was: This is multifactorial and due to a combination of hypogonadism as well as I anticipate obstructive sleep apnea.  I recommended a referral to a sleep specialist for a split level sleep study.  I believe the patient would significantly benefit from CPAP therapy as far as treating his hypersomnolence and  excessive daytime sleepiness.  I am sure this will also help with his fatigue.  However I believe also receiving testosterone replacement would also help with his fatigue and hot flashes.  Therefore I will start him on testosterone cypionate 200 mg IM every 2 weeks.  The wife will pick up the supplies and bring them back to the clinic so that we can teach her how to administer the shots.  Recheck all labs in 3 months.  We discussed the increased risk of cardiovascular events on testosterone as well as increased risk of prostate cancer and the patient is willing to accept these risk  10/23/17 Since I last saw him, he has refused the sleep study.  He states that he is claustrophobic and he refuses to wear the mask and therefore he does not want to be evaluated.  He continues to have daily hot flashes that are insufferable.  He does not even want to leave the house due to the hot and cold flashes he continues to  feel.  He is received 2 doses of testosterone over the last 4 weeks but has seen no improvement in the hot flashes over the fatigue.  He is afebrile.  He does have a cough and chest congestion however he has no other symptoms of any infection.  He denies any sore throat.  He denies any otalgia or sinus pain.  He denies any nausea vomiting or diarrhea.  He denies any dysuria.  On exam today, he does have left posterior basilar wheezes and crackles which are asymmetric and abnormal.  Otherwise his exam is unremarkable.  His wife is concerned because we have not checked a vitamin B12 level.  Previous lab work showed significant hypogonadism and a normal TSH.  At that time, my plan was: Hot flashes could possibly be due to testosterone deficiency however I am concerned about underlying malignancy.  Given his cough, his abnormal pulmonary exam, I will proceed with a chest x-ray.  Given his chronic fatigue I will check a vitamin B12 along with a CBC.  Given his hot flashes which potentially could be fevers and  chronic fatigue I will perform a serum protein electrophoresis to evaluate for multiple myeloma.  I believe his fatigue is still likely multifactorial and related to smoking, hypogonadism, and untreated obstructive sleep apnea.  Addendum (10/29/17) Lab work was unremarkable.  Chest x-ray showed a possible right hilar mass.  Patient has a past medical history of partial right lung resection due to cancer performed at the [place] 2 years ago.  He has no pulmonologist there who follows with him.  CT scan was performed which suggest scar tissue in the right lung was the cause of the opacity seen on the chest x-ray.  However there was a polypoid mass in the trachea of uncertain significance.  Possible mucus however cancer cannot be ruled out.  Given the patient's night sweats and fatigue, I have recommended pulmonary consult for possible bronchoscopy to evaluate the lesion seen in his distal trachea I discussed these findings with his wife and she is in agreement.  Consult pulmonary.  11/07/17 Patient went to the hospital since I last saw him.  He continues to endorse sudden rapid hot flashes.  For instance while sitting on the exam table, he rapidly starts taking off his jacket and rocking uncontrollably stating that he needs to get out of this room is too hot.  However he is not warm to the touch.  He is not visibly diaphoretic or erythematous.  Patient has seen no benefit on testosterone however I have not recheck the level since he started the medication almost 2 months ago to ensure adequate dosage.  I have not screen the patient for TB.  I have not check blood cultures although I do not believe his story is consistent with fever of unknown origin.  The wound on his right leg is healing nicely under the care of the wound clinic.  There is no evidence of cellulitis.   At that time, I started him on gabapentin which seemed to help his hot flashes tremendously.  However over the last month or so, the hot flashes  have returned.  The patient feels so poorly that his wife discontinued his gabapentin which only exacerbated the hot flashes.  She felt that the gabapentin may be making him feel tired and weak.  His biggest complaint seems to be just sudden onset of severe facial flushing and hot flashes.  He then feels cold for no reason.  This occurs  all over his body.  EpiPen is not helping with this.  He also feels extremely weak and tired.  Over the last few months, the patient has lost approximately 14 pounds.  He has very little appetite.  Reports severe fatigue.  He has known hypogonadism and is not being treated.  In the past I tried him on testosterone but the patient saw no benefit and discontinued the medication.  He also has known obstructive sleep apnea and is scheduled for a home sleep study starting next week however he is not wearing CPAP at the present time.  I have felt that these 2 factors are likely contributing to his fatigue as well.  Patient denies any chest pain.  He denies any shortness of breath.  He denies any dyspnea on exertion.  He denies any abdominal pain.  He denies any melena or hematochezia.  He denies any nausea or vomiting.  Past Medical History:  Diagnosis Date   AAA (abdominal aortic aneurysm) ([place]) 5/15   3.5x3.5 cm   COPD (chronic obstructive pulmonary disease) ([facility])    Depression    Diabetes mellitus without complication ([facility])    GERD (gastroesophageal reflux disease)    Hyperlipidemia    Hypothyroidism    Post traumatic stress disorder    Stroke [facility])    Thyroid disease    Ulcer    Past Surgical History:  Procedure Laterality Date   ABDOMINAL AORTOGRAM W/LOWER EXTREMITY N/A 08/20/2017   Procedure: ABDOMINAL AORTOGRAM W/LOWER EXTREMITY;  Surgeon: Waynetta Sandy, MD;  Location: [place] [facility];  Service: Cardiovascular;  Laterality: N/A;   CATARACT EXTRACTION     CHOLECYSTECTOMY     ENDOVENOUS ABLATION SAPHENOUS VEIN W/ LASER Right  03/18/2018   endovenous laser ablation R GSV by Ruta Hinds MD    ENDOVENOUS ABLATION SAPHENOUS VEIN W/ LASER Right 04/08/2018   endovenous laser ablation R SSV by Ruta Hinds MD    [place], PARTIAL     right, 2 years ago at [facility] for cancer   PERIPHERAL VASCULAR ATHERECTOMY Right 08/20/2017   Procedure: PERIPHERAL VASCULAR ATHERECTOMY;  Surgeon: Waynetta Sandy, MD;  Location: [place] [facility];  Service: Cardiovascular;  Laterality: Right;  posterior tibial and tibeoperoneal trunk   PERIPHERAL VASCULAR BALLOON ANGIOPLASTY Right 08/20/2017   Procedure: PERIPHERAL VASCULAR BALLOON ANGIOPLASTY;  Surgeon: Waynetta Sandy, MD;  Location: [place] [facility];  Service: Cardiovascular;  Laterality: Right;  Anterior tibial   VIDEO BRONCHOSCOPY Bilateral 12/30/2017   Procedure: VIDEO BRONCHOSCOPY WITHOUT FLUORO;  Surgeon: Marshell Garfinkel, MD;  Location: [facility];  Service: Cardiopulmonary;  Laterality: Bilateral;   Current Outpatient Medications on File Prior to Visit  Medication Sig Dispense Refill   albuterol (PROVENTIL HFA;VENTOLIN HFA) 108 (90 Base) MCG/ACT inhaler INHALE 2 PUFFS INTO THE LUNGS EVERY 6 HOURS AS NEEDED FOR WHEEZE OR SHORTNESS OF BREATH 18 g 3   amoxicillin (AMOXIL) 875 MG tablet Take 1 tablet (875 mg total) by mouth 2 (two) times daily. 20 tablet 0   amphetamine-dextroamphetamine (ADDERALL XR) 15 MG 24 hr capsule Take 1 capsule by mouth every morning. 30 capsule 0   atorvastatin (LIPITOR) 10 MG tablet Take 10 mg by mouth every other day.      busPIRone (BUSPAR) 10 MG tablet Take 0.5 tablets (5 mg total) by mouth 2 (two) times daily as needed (for anxiety). (Patient taking differently: Take 10 mg by mouth 2 (two) times daily. ) 60 tablet 0  ciprofloxacin (CIPRO) 500 MG tablet Take 1 tablet (500 mg total) by mouth 2 (two) times daily. 20 tablet 0   clopidogrel (PLAVIX) 75 MG tablet Take 1 tablet (75 mg total) by mouth daily  with breakfast. 90 tablet 3   doxycycline (VIBRAMYCIN) 100 MG capsule Take 100 mg by mouth 2 (two) times daily.     fluticasone (FLONASE) 50 MCG/ACT nasal spray Place 2 sprays into both nostrils daily. 16 g 6   Fluticasone-Umeclidin-Vilant (TRELEGY ELLIPTA) 100-62.5-25 MCG/INH AEPB Inhale 1 Inhaler into the lungs daily. 1 each 11   gabapentin (NEURONTIN) 300 MG capsule Take 1 capsule (300 mg total) by mouth 3 (three) times daily. (Patient taking differently: Take 300 mg by mouth 2 (two) times daily. ) 90 capsule 3   insulin glargine (LANTUS) 100 UNIT/ML injection Inject 45-55 Units into the skin 2 (two) times daily. Take 45 units in the morning and 55 units at night     levothyroxine (SYNTHROID, LEVOTHROID) 125 MCG tablet Take 1 tablet (125 mcg total) by mouth daily before breakfast. 30 tablet 1   meclizine (ANTIVERT) 25 MG tablet Take 1 tablet (25 mg total) by mouth 3 (three) times daily as needed for dizziness (vertigo). 30 tablet 0   metFORMIN (GLUCOPHAGE) 500 MG tablet Take 500 mg by mouth 2 (two) times daily with a meal.     metroNIDAZOLE (FLAGYL) 500 MG tablet Take 1 tablet (500 mg total) by mouth 2 (two) times daily. 20 tablet 0   morphine (MS CONTIN) 100 MG 12 hr tablet Take 100 mg by mouth every 12 (twelve) hours as needed for pain.      MOVANTIK 25 MG TABS tablet TAKE 1 TABLET BY MOUTH EVERY DAY 30 tablet 5   oxymetazoline (AFRIN) 0.05 % nasal spray Place 2 sprays into both nostrils 2 (two) times daily as needed for congestion.     RABEprazole (ACIPHEX) 20 MG tablet Take 2 tablets (40 mg total) by mouth daily before breakfast. 30 tablet 1   tamsulosin (FLOMAX) 0.4 MG CAPS capsule Take 0.4 mg by mouth at bedtime.      Tiotropium Bromide-Olodaterol (STIOLTO RESPIMAT) 2.5-2.5 MCG/ACT AERS Inhale 2 Inhalers into the lungs daily. 12 g 3   traZODone (DESYREL) 100 MG tablet Take 3 tablets (300 mg total) by mouth at bedtime. 30 tablet 0   venlafaxine XR (EFFEXOR-XR) 150 MG 24 hr  capsule Take 1 capsule (150 mg total) by mouth daily with breakfast. (Patient taking differently: Take 150 mg by mouth 2 (two) times daily. ) 30 capsule 1   No current facility-administered medications on file prior to visit.    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 on file   Food insecurity:    Worry: Not on file    Inability: Not on file   Transportation needs:    Medical: Not on file    Non-medical: Not on file  Tobacco Use   Smoking status: Current Every Day Smoker    Packs/day: 1.00    Years: 30.00    Pack years: 30.00    Types: Cigarettes   Smokeless tobacco: Former Systems developer    Quit date: 05/04/2013   Tobacco comment:  Less than 1/2 pk per day  Substance and Sexual Activity   Alcohol use: No    Comment: Former heavy drinker, been sober for 10 years   Drug use: No   Sexual activity: Not on file  Lifestyle   Physical activity:    Days per week: Not on file    Minutes per session: Not on file   Stress: Not on file  Relationships   Social connections:    Talks on phone: Not on file    Gets together: Not on file    Attends religious service: Not on file    Active member of club or organization: Not on file    Attends meetings of clubs or organizations: Not on file    Relationship status: Not on file   Intimate partner violence:    Fear of current or ex partner: Not on file    Emotionally abused: Not on file    Physically abused: Not on file    Forced sexual activity: Not on file  Other Topics Concern   Not on file  Social History Narrative   Patient lives at home with spouse.   Caffeine Use: 3-4 cups daily   Retired Dealer.      Review of Systems  All other  systems reviewed and are negative.      Objective:   Physical Exam Vitals signs reviewed.  Constitutional:      General: He is not in acute distress.    Appearance: Normal appearance. He is obese. He is not ill-appearing, toxic-appearing or diaphoretic.  Cardiovascular:     Rate and Rhythm: Normal rate and regular rhythm.     Heart sounds: Normal heart sounds.  Pulmonary:     Effort: Pulmonary effort is normal. No respiratory distress.     Breath sounds: Normal breath sounds. No stridor. No wheezing, rhonchi or rales.  Chest:     Chest wall: No tenderness.  Abdominal:     General: Abdomen is flat. Bowel sounds are normal. There is no distension.     Palpations: Abdomen is soft. There is no mass.     Tenderness: There is no abdominal tenderness. There is no right CVA tenderness, left CVA tenderness or guarding.  Neurological:     Mental Status: He is alert.           Assessment & Plan:  LLQ abdominal pain  Hypersomnolence  Vasomotor flushing  Chronic fatigue  Left lower quadrant abdominal pain has resolved on Cipro and Flagyl.  I encouraged the patient to complete those antibiotics for possible diverticulitis.  Is also possible he had constipation which is also improved over the last 3 days.  Patient is unable to verbalize exactly what he means by feeling poorly but as best I can gather, he has chronic fatigue severe vasomotor flushing and hypersomnolence.  I am also concerned by his weight loss.  This is been a chronic issue ongoing now for more than a year as documented above.  I believe is likely multifactorial due to chronic medical problems, age, hypogonadism, sleep apnea, etc.  We have tried testosterone with no benefit and he discontinued the testosterone.  He is currently on venlafaxine for depression.  He is also on gabapentin for vasomotor flushing.  At this point my only other option is to try clonidine 0.1 mg transdermal patch per 24 hours.  He will wear this patch  weekly to see if this will help with some of his vasomotor flushing and I will  start the patient on the lowest dose due to his low blood pressure.  We will need to monitor for sedation, dry mouth, and hypotension.  Reassess in 1 to 2 weeks.  The only other explanation for his vasomotor flushing that has not been fully evaluated would be carcinoid syndrome however he denies any diarrhea.  Given his weight loss, if he continues to experience the flushing/hot flashes and weight loss, I would recommend a referral to GI for endoscopy and colonoscopy.  Consider also urine studies to evaluate for carcinoid syndrome however I think this is less likely given the duration of symptoms

## 2018-12-25 ENCOUNTER — Ambulatory Visit: Payer: Medicare HMO | Admitting: Family Medicine

## 2018-12-25 DIAGNOSIS — J449 Chronic obstructive pulmonary disease, unspecified: Secondary | ICD-10-CM | POA: Diagnosis not present

## 2018-12-25 DIAGNOSIS — E11622 Type 2 diabetes mellitus with other skin ulcer: Secondary | ICD-10-CM | POA: Diagnosis not present

## 2018-12-25 DIAGNOSIS — L97812 Non-pressure chronic ulcer of other part of right lower leg with fat layer exposed: Secondary | ICD-10-CM | POA: Diagnosis not present

## 2018-12-25 DIAGNOSIS — Z794 Long term (current) use of insulin: Secondary | ICD-10-CM | POA: Diagnosis not present

## 2018-12-25 DIAGNOSIS — B965 Pseudomonas (aeruginosa) (mallei) (pseudomallei) as the cause of diseases classified elsewhere: Secondary | ICD-10-CM | POA: Diagnosis not present

## 2018-12-25 DIAGNOSIS — Z85118 Personal history of other malignant neoplasm of bronchus and lung: Secondary | ICD-10-CM | POA: Diagnosis not present

## 2018-12-25 DIAGNOSIS — Z9181 History of falling: Secondary | ICD-10-CM | POA: Diagnosis not present

## 2018-12-25 DIAGNOSIS — Z9981 Dependence on supplemental oxygen: Secondary | ICD-10-CM | POA: Diagnosis not present

## 2018-12-25 DIAGNOSIS — F17219 Nicotine dependence, cigarettes, with unspecified nicotine-induced disorders: Secondary | ICD-10-CM | POA: Diagnosis not present

## 2018-12-29 ENCOUNTER — Ambulatory Visit: Payer: Medicare HMO | Admitting: Family Medicine

## 2018-12-29 ENCOUNTER — Other Ambulatory Visit: Payer: Self-pay

## 2018-12-29 ENCOUNTER — Ambulatory Visit (INDEPENDENT_AMBULATORY_CARE_PROVIDER_SITE_OTHER): Payer: Medicare HMO | Admitting: Neurology

## 2018-12-29 DIAGNOSIS — G4733 Obstructive sleep apnea (adult) (pediatric): Secondary | ICD-10-CM

## 2018-12-29 DIAGNOSIS — G4719 Other hypersomnia: Secondary | ICD-10-CM

## 2018-12-29 DIAGNOSIS — R0683 Snoring: Secondary | ICD-10-CM

## 2018-12-29 DIAGNOSIS — Z8673 Personal history of transient ischemic attack (TIA), and cerebral infarction without residual deficits: Secondary | ICD-10-CM

## 2018-12-29 DIAGNOSIS — J449 Chronic obstructive pulmonary disease, unspecified: Secondary | ICD-10-CM

## 2018-12-29 DIAGNOSIS — R0681 Apnea, not elsewhere classified: Secondary | ICD-10-CM

## 2018-12-29 DIAGNOSIS — F1190 Opioid use, unspecified, uncomplicated: Secondary | ICD-10-CM

## 2018-12-30 DIAGNOSIS — L97812 Non-pressure chronic ulcer of other part of right lower leg with fat layer exposed: Secondary | ICD-10-CM | POA: Diagnosis not present

## 2018-12-30 DIAGNOSIS — J449 Chronic obstructive pulmonary disease, unspecified: Secondary | ICD-10-CM | POA: Diagnosis not present

## 2018-12-30 DIAGNOSIS — F17219 Nicotine dependence, cigarettes, with unspecified nicotine-induced disorders: Secondary | ICD-10-CM | POA: Diagnosis not present

## 2018-12-30 DIAGNOSIS — Z9981 Dependence on supplemental oxygen: Secondary | ICD-10-CM | POA: Diagnosis not present

## 2018-12-30 DIAGNOSIS — B965 Pseudomonas (aeruginosa) (mallei) (pseudomallei) as the cause of diseases classified elsewhere: Secondary | ICD-10-CM | POA: Diagnosis not present

## 2018-12-30 DIAGNOSIS — E11622 Type 2 diabetes mellitus with other skin ulcer: Secondary | ICD-10-CM | POA: Diagnosis not present

## 2018-12-30 DIAGNOSIS — Z85118 Personal history of other malignant neoplasm of bronchus and lung: Secondary | ICD-10-CM | POA: Diagnosis not present

## 2018-12-30 DIAGNOSIS — Z794 Long term (current) use of insulin: Secondary | ICD-10-CM | POA: Diagnosis not present

## 2018-12-30 DIAGNOSIS — Z9181 History of falling: Secondary | ICD-10-CM | POA: Diagnosis not present

## 2019-01-01 ENCOUNTER — Ambulatory Visit (INDEPENDENT_AMBULATORY_CARE_PROVIDER_SITE_OTHER): Payer: Medicare HMO | Admitting: Family Medicine

## 2019-01-01 ENCOUNTER — Other Ambulatory Visit: Payer: Self-pay

## 2019-01-01 DIAGNOSIS — J029 Acute pharyngitis, unspecified: Secondary | ICD-10-CM | POA: Diagnosis not present

## 2019-01-01 DIAGNOSIS — E11622 Type 2 diabetes mellitus with other skin ulcer: Secondary | ICD-10-CM | POA: Diagnosis not present

## 2019-01-01 DIAGNOSIS — Z794 Long term (current) use of insulin: Secondary | ICD-10-CM | POA: Diagnosis not present

## 2019-01-01 DIAGNOSIS — F17219 Nicotine dependence, cigarettes, with unspecified nicotine-induced disorders: Secondary | ICD-10-CM | POA: Diagnosis not present

## 2019-01-01 DIAGNOSIS — B965 Pseudomonas (aeruginosa) (mallei) (pseudomallei) as the cause of diseases classified elsewhere: Secondary | ICD-10-CM | POA: Diagnosis not present

## 2019-01-01 DIAGNOSIS — Z85118 Personal history of other malignant neoplasm of bronchus and lung: Secondary | ICD-10-CM | POA: Diagnosis not present

## 2019-01-01 DIAGNOSIS — J449 Chronic obstructive pulmonary disease, unspecified: Secondary | ICD-10-CM | POA: Diagnosis not present

## 2019-01-01 DIAGNOSIS — Z9981 Dependence on supplemental oxygen: Secondary | ICD-10-CM | POA: Diagnosis not present

## 2019-01-01 DIAGNOSIS — Z9181 History of falling: Secondary | ICD-10-CM | POA: Diagnosis not present

## 2019-01-01 DIAGNOSIS — L97812 Non-pressure chronic ulcer of other part of right lower leg with fat layer exposed: Secondary | ICD-10-CM | POA: Diagnosis not present

## 2019-01-01 MED ORDER — AMOXICILLIN 875 MG PO TABS: 875.0000 mg | ORAL_TABLET | Freq: Two times a day (BID) | ORAL | 0 refills | Status: DC

## 2019-01-01 NOTE — Progress Notes (Signed)
Subjective:    Patient ID: Brad Taylor, male    DOB: 1951/06/30, 68 y.o.   MRN: [id number]  HPI Patient is being seen today over the telephone.  Phone call began at 940.  Phone call ended at 950.  Patient consents to be seen by telephone.  He is at home.  His wife was included on the phone.  Patient states that he developed a sore throat yesterday.  He denies any fevers or chills.  He denies any cough.  He denies any rhinorrhea.  The sore throat is a dull sore throat.  Wife states that his temperature this morning is 98.6.  I asked her to examine his posterior oropharynx.  She denies any erythema in the posterior oropharynx.  She denies any exudate or pus in the posterior oropharynx.  He denies any tender lymphadenopathy in the neck.  He denies any nausea or vomiting Past Medical History:  Diagnosis Date  . AAA (abdominal aortic aneurysm) ([place]) 5/15   3.5x3.5 cm  . COPD (chronic obstructive pulmonary disease) ([place])   . Depression   . Diabetes mellitus without complication ([place])   . GERD (gastroesophageal reflux disease)   . Hyperlipidemia   . Hypothyroidism   . Post traumatic stress disorder   . PVD (peripheral vascular disease) ([place])   . Stroke ([place])   . Thoracic aortic aneurysm ([place])   . Thyroid disease   . Ulcer    Past Surgical History:  Procedure Laterality Date  . ABDOMINAL AORTOGRAM W/LOWER EXTREMITY N/A 08/20/2017   Procedure: ABDOMINAL AORTOGRAM W/LOWER EXTREMITY;  Surgeon: Waynetta Sandy, MD;  Location: [facility];  Service: Cardiovascular;  Laterality: N/A;  . CATARACT EXTRACTION    . CHOLECYSTECTOMY    . ENDOVENOUS ABLATION SAPHENOUS VEIN W/ LASER Right 03/18/2018   endovenous laser ablation R [facility] by Ruta Hinds MD   . ENDOVENOUS ABLATION SAPHENOUS VEIN W/ LASER Right 04/08/2018   endovenous laser ablation R [facility] by Ruta Hinds MD   . [place]    . LUNG REMOVAL, PARTIAL     right, 2 years ago at [facility] for cancer  . PERIPHERAL VASCULAR  ATHERECTOMY Right 08/20/2017   Procedure: PERIPHERAL VASCULAR ATHERECTOMY;  Surgeon: Waynetta Sandy, MD;  Location: [facility];  Service: Cardiovascular;  Laterality: Right;  posterior tibial and tibeoperoneal trunk  . PERIPHERAL VASCULAR BALLOON ANGIOPLASTY Right 08/20/2017   Procedure: PERIPHERAL VASCULAR BALLOON ANGIOPLASTY;  Surgeon: Waynetta Sandy, MD;  Location: [facility];  Service: Cardiovascular;  Laterality: Right;  Anterior tibial  . VIDEO BRONCHOSCOPY Bilateral 12/30/2017   Procedure: VIDEO BRONCHOSCOPY WITHOUT FLUORO;  Surgeon: Marshell Garfinkel, MD;  Location: [facility];  Service: Cardiopulmonary;  Laterality: Bilateral;   Current Outpatient Medications on File Prior to Visit  Medication Sig Dispense Refill  . albuterol (PROVENTIL HFA;VENTOLIN HFA) 108 (90 Base) MCG/ACT inhaler INHALE 2 PUFFS INTO THE LUNGS EVERY 6 HOURS AS NEEDED FOR WHEEZE OR SHORTNESS OF BREATH 18 g 3  . amphetamine-dextroamphetamine (ADDERALL XR) 15 MG 24 hr capsule Take 1 capsule by mouth every morning. 30 capsule 0  . atorvastatin (LIPITOR) 10 MG tablet Take 10 mg by mouth every other day.     . busPIRone (BUSPAR) 10 MG tablet Take 0.5 tablets (5 mg total) by mouth 2 (two) times daily as needed (for anxiety). (Patient taking differently: Take 10 mg by mouth 2 (two) times daily. ) 60 tablet 0  . ciprofloxacin (CIPRO) 500 MG tablet Take 1  tablet (500 mg total) by mouth 2 (two) times daily. 20 tablet 0  . cloNIDine (CATAPRES - DOSED IN MG/24 HR) 0.1 mg/24hr patch Place 1 patch (0.1 mg total) onto the skin once a week. 4 patch 12  . clopidogrel (PLAVIX) 75 MG tablet Take 1 tablet (75 mg total) by mouth daily with breakfast. 90 tablet 3  . fluticasone (FLONASE) 50 MCG/ACT nasal spray Place 2 sprays into both nostrils daily. 16 g 6  . Fluticasone-Umeclidin-Vilant (TRELEGY ELLIPTA) 100-62.5-25 MCG/INH AEPB Inhale 1 Inhaler into the lungs daily. 1 each 11  . gabapentin (NEURONTIN) 300  MG capsule Take 1 capsule (300 mg total) by mouth 3 (three) times daily. (Patient taking differently: Take 300 mg by mouth 2 (two) times daily. ) 90 capsule 3  . insulin glargine (LANTUS) 100 UNIT/ML injection Inject 45-55 Units into the skin 2 (two) times daily. Take 45 units in the morning and 55 units at night    . levothyroxine (SYNTHROID, LEVOTHROID) 125 MCG tablet Take 1 tablet (125 mcg total) by mouth daily before breakfast. 30 tablet 1  . meclizine (ANTIVERT) 25 MG tablet Take 1 tablet (25 mg total) by mouth 3 (three) times daily as needed for dizziness (vertigo). 30 tablet 0  . metFORMIN (GLUCOPHAGE) 500 MG tablet Take 500 mg by mouth 2 (two) times daily with a meal.    . metroNIDAZOLE (FLAGYL) 500 MG tablet Take 1 tablet (500 mg total) by mouth 2 (two) times daily. 20 tablet 0  . morphine (MS CONTIN) 100 MG 12 hr tablet Take 100 mg by mouth every 12 (twelve) hours as needed for pain.     Marland Kitchen MOVANTIK 25 MG TABS tablet TAKE 1 TABLET BY MOUTH EVERY DAY 30 tablet 5  . oxymetazoline (AFRIN) 0.05 % nasal spray Place 2 sprays into both nostrils 2 (two) times daily as needed for congestion.    . RABEprazole (ACIPHEX) 20 MG tablet Take 2 tablets (40 mg total) by mouth daily before breakfast. 30 tablet 1  . tamsulosin (FLOMAX) 0.4 MG CAPS capsule Take 0.4 mg by mouth at bedtime.     . Tiotropium Bromide-Olodaterol (STIOLTO RESPIMAT) 2.5-2.5 MCG/ACT AERS Inhale 2 Inhalers into the lungs daily. 12 g 3  . traZODone (DESYREL) 100 MG tablet Take 3 tablets (300 mg total) by mouth at bedtime. 30 tablet 0  . venlafaxine XR (EFFEXOR-XR) 150 MG 24 hr capsule Take 1 capsule (150 mg total) by mouth daily with breakfast. (Patient taking differently: Take 150 mg by mouth 2 (two) times daily. ) 30 capsule 1   No current facility-administered medications on file prior to visit.    No Known Allergies Social History   Socioeconomic History  . Marital status: Married    Spouse name: Diane  . Number of children: 1   . Years of education: HS  . Highest education level: Not on file  Occupational History  . Occupation: Retired Dealer    Comment: [employer]  Social Needs  . Financial resource strain: Not on file  . Food insecurity:    Worry: Not on file    Inability: Not on file  . Transportation needs:    Medical: Not on file    Non-medical: Not on file  Tobacco Use  . Smoking status: Current Every Day Smoker    Packs/day: 1.00    Years: 30.00    Pack years: 30.00    Types: Cigarettes  . Smokeless tobacco: Former Systems developer    Quit date: 05/04/2013  .  Tobacco comment: Less than 1/2 pk per day  Substance and Sexual Activity  . Alcohol use: No    Comment: Former heavy drinker, been sober for 10 years  . Drug use: No  . Sexual activity: Not on file  Lifestyle  . Physical activity:    Days per week: Not on file    Minutes per session: Not on file  . Stress: Not on file  Relationships  . Social connections:    Talks on phone: Not on file    Gets together: Not on file    Attends religious service: Not on file    Active member of club or organization: Not on file    Attends meetings of clubs or organizations: Not on file    Relationship status: Not on file  . Intimate partner violence:    Fear of current or ex partner: Not on file    Emotionally abused: Not on file    Physically abused: Not on file    Forced sexual activity: Not on file  Other Topics Concern  . Not on file  Social History Narrative   Patient lives at home with spouse.   Caffeine Use: 3-4 cups daily   Retired Dealer.      Review of Systems  All other systems reviewed and are negative.      Objective:   Physical Exam  Physical exam could not be performed today as the patient was seen over the telephone      Assessment & Plan:  Acute pharyngitis, unspecified etiology  I believe the patient likely has viral pharyngitis.  I recommended supportive care.  He is to gargle salt water 2-3 times a day.  He can  use Chloraseptic spray as needed for pain and/or Cepacol lozenges as needed for pain.  Anticipate self-limited resolution over the next 3 to 4 days.  If he should develop a fever greater than 100.3, severe erythema in the posterior oropharynx or white, puslike exudate in the posterior oropharynx, he can use amoxicillin 875 mg p.o. twice daily however I believe his likelihood of having strep throat is extremely low and the symptoms do not sound severe enough to be mono.

## 2019-01-04 ENCOUNTER — Telehealth: Payer: Self-pay | Admitting: Family Medicine

## 2019-01-04 ENCOUNTER — Telehealth: Payer: Self-pay

## 2019-01-04 DIAGNOSIS — B351 Tinea unguium: Secondary | ICD-10-CM | POA: Diagnosis not present

## 2019-01-04 DIAGNOSIS — B353 Tinea pedis: Secondary | ICD-10-CM | POA: Diagnosis not present

## 2019-01-04 DIAGNOSIS — R600 Localized edema: Secondary | ICD-10-CM | POA: Diagnosis not present

## 2019-01-04 DIAGNOSIS — L97219 Non-pressure chronic ulcer of right calf with unspecified severity: Secondary | ICD-10-CM | POA: Diagnosis not present

## 2019-01-04 DIAGNOSIS — I70232 Atherosclerosis of native arteries of right leg with ulceration of calf: Secondary | ICD-10-CM | POA: Diagnosis not present

## 2019-01-04 DIAGNOSIS — I83212 Varicose veins of right lower extremity with both ulcer of calf and inflammation: Secondary | ICD-10-CM | POA: Diagnosis not present

## 2019-01-04 DIAGNOSIS — L988 Other specified disorders of the skin and subcutaneous tissue: Secondary | ICD-10-CM | POA: Diagnosis not present

## 2019-01-04 DIAGNOSIS — I890 Lymphedema, not elsewhere classified: Secondary | ICD-10-CM | POA: Diagnosis not present

## 2019-01-04 NOTE — Addendum Note (Signed)
Addended by: Star Age on: 01/04/2019 09:54 AM   Modules accepted: Orders

## 2019-01-04 NOTE — Procedures (Signed)
Patient Information     First Name: Brad Last Name: Mehmet Taylor: [id number]  Birth Date: 16-Jul-1951 Age: 68 Gender: Male  Referring Provider: [name], MD BMI: 35.0 (W=244 lb, H=5' 10'')  Neck Circ.:  18.5 '' [place]: 12/24  Sleep Study Information    Study Date: Dec 29, 2018 S/H/A Version: [id number] / 4.1.1528 / 88  History:   68 year old man with an underlying complex medical history of hyperlipidemia, depression, PTSD (by chart review), stroke, hypothyroidism, reflux disease, diabetes, COPD, recent smoking cessation, hypothyroidism, peripheral artery disease with balloon angioplasty as well as vascular atherectomy, lung cancer with partial right lung removal, chronic pain, on narcotic pain medication, and obesity, who reports snoring, witnessed apneas and excessive daytime sleepiness.  Diagnosis:      OSA  Recommendations:     This home sleep test demonstrates moderate obstructive sleep apnea with a total AHI of 23.5/hour and O2 nadir of 72%. Treatment with positive airway pressure (PAP) - in the form of CPAP - is recommended. This will require, ideally, a full night CPAP titration study for proper treatment settings, O2 monitoring and mask fitting. Based on the severity of the sleep disordered breathing, an attended titration study is indicated. However, under the current circumstances (i.e. the COVID-19 pandemic), in order to ensure continuity of care and for the safety of the patient and healthcare professionals, he will be advised to proceed with an autoPAP titration/trial at home. A proper overnight, lab-attended PAP titration study with CPAP may be helpful or needed down the road to optimize treatment, when considered safe. Please note, that untreated obstructive sleep apnea may carry additional perioperative morbidity. Patients with significant obstructive sleep apnea should receive perioperative PAP therapy and the surgeons and particularly the anesthesiologist should be informed  of the diagnosis and the severity of the sleep disordered breathing. Patient will be reminded regarding compliance with the PAP machine and to be mindful of cleanliness with the equipment and timely with supply changes (i.e. changing filter, mask, hose, humidifier chamber on an ongoing basis, as recommended, and cleaning parts that touch the face and nose daily, etc). The patient should be cautioned not to drive, work at heights, or operate dangerous or heavy equipment when tired or sleepy. Review and reiteration of good sleep hygiene measures should be pursued with any patient. Other causes of the patient's symptoms, including circadian rhythm disturbances, an underlying mood disorder, medication effect and/or an underlying medical problem cannot be ruled out based on this test. Clinical correlation is recommended. The patient and his referring provider will be notified of the test results. The patient will be seen in follow up in sleep clinic at [facility], either for a face-to-face or virtual visit, whichever feasible and recommended at the time.  I certify that I have reviewed the raw data recording prior to the issuance of this report in accordance with the standards of the American Academy of Sleep Medicine (AASM).  Star Age, MD, PhD [facility]) Diplomat, ABPN (Neurology and Sleep)       Sleep Summary    Oxygen Saturation Statistics     Start Study Time: End Study Time: Total Recording Time:         8:42:06 PM        6:46:07 AM 10 hrs, 4 min  Total Sleep Time % REM of Sleep Time:  8 hrs, 11 min  27.0    Mean: 92 Minimum: 72 Maximum: 97  Mean of Desaturations [place]irs (%):  86  Oxygen Desaturation %:   4-9 10-20 >20 Total  Events Number Total    97  1 99.0 1.0  0 0.0  98 100.0  Oxygen Saturation: <90 <=88 <85 <80 <70  Duration (minutes): Sleep % 86.0 17.5 54.9 9.7 11.2 2.0 2.5 0.5 0.0 0.0     Respiratory Indices      Total Events REM NREM All  Night  pRDI:  178  pAHI:  171 ODI:  98  pAHIc:  6  % CSR: 0.0 35.4 34.8 20.1 1.1 20.8 19.7 11.2 0.7 24.5 23.5 13.5 0.8       Pulse Rate Statistics during Sleep (BPM)      Mean:  69 Minimum: N/A Maximum: 121    Indices are calculated using technically valid sleep time of  7 hrs, 16 min. pRDI/pAHI are calculated using oxi desaturations ? 3%  Body Position Statistics  Position Supine Prone Right Left Non-Supine  Sleep (min) 462.5 0.0 3.0 26.0 29.0  Sleep % 94.1 0.0 0.6 5.3 5.9  pRDI 24.6 N/A N/A 16.4 23.1  pAHI 23.5 N/A N/A 16.4 23.1  ODI 13.4 N/A N/A 4.7 14.7     Snoring Statistics Snoring Level (dB) >40 >50 >60 >70 >80 >Threshold (45)  Sleep (min) 74.5 3.1 0.8 0.0 0.0 8.1  Sleep % 15.2 0.6 0.2 0.0 0.0 1.6    Mean: 40 dB Sleep Stages Chart                                    pAHI=23.5                                                 Mild              Moderate                    Severe                                                    5              15                    30

## 2019-01-04 NOTE — Telephone Encounter (Signed)
Received voicemail x 3 from patient's wife stating that she has received a bill from insurance stating that referral was not

## 2019-01-04 NOTE — Telephone Encounter (Signed)
I called pt. I advised pt that Dr. Rexene Alberts reviewed their sleep study results and found that pt has moderate osa. Dr. Rexene Alberts recommends that pt start an auto pap at home. I reviewed PAP compliance expectations with the pt. Pt is agreeable to starting an auto-PAP. I advised pt that an order will be sent to a DME, [facility], and [facility] will call the pt within about one week after they file with the pt's insurance. [facility] will show the pt how to use the machine, fit for masks, and troubleshoot the auto-PAP if needed. A follow up appt was made for insurance purposes with Jinny Blossom, NP on 04/01/19 at 8:00am. Pt verbalized understanding to arrive 15 minutes early and bring their auto-PAP. A letter with all of this information in it will be mailed to the pt as a reminder. I verified with the pt that the address we have on file is correct. Pt verbalized understanding of results. Pt had no questions at this time but was encouraged to call back if questions arise. I have sent the order to [facility] and have received confirmation that they have received the order.

## 2019-01-04 NOTE — Progress Notes (Signed)
Patient referred by Dr. Dennard Schaumann, seen virtually by me on 11/30/18, HST on 12/29/18.    Please call and notify the patient that the recent home sleep test showed obstructive sleep apnea in the moderate range. While I recommend treatment for this in the form CPAP, we are not yet bringing patients in for in-lab testing for CPAP titration studies, due to the virus pandemic; therefore, I suggest we start him on autoPAP at home, which means, that we don't have to bring him in for a sleep study with CPAP, but will let him start using an autoPAP machine at home, through a DME company (of patient's choice, or as per insurance requirement, as per in [employer], if there are such restrictions, depending on insurance carrier). The DME representative will educate the patient on how to use the machine, how to put the mask on, etc. I have placed an order in the chart. Please send referral, talk to patient, send report to referring MD. We will need a FU in sleep clinic for 10 weeks post-PAP set up, please arrange that with me or one of our NPs.  Also, please remind patient about the importance of compliance with PAP usage; this is an Designer, industrial/product, but good compliance also helps [place] track improvements in patient's sleep related complaints and objective improvements, such as BP and weight for example or nocturia or headaches, etc. For concerns and questions about how to clean the PAP machine and the supplies and how frequently to change the hose, mask and filters, etc., patient can call the DME company, for more information, education and troubleshooting. Especially in the current situation, I recommend, patients be extra mindful about hand hygiene, handling the PAP equipment only with clean hands, wipe the mask daily, keep little one and four-legged companions (and any other pets for that matter) away from the machine and mask at all times.    Thanks,   Star Age, MD, PhD [facility]  [facility])

## 2019-01-04 NOTE — Telephone Encounter (Signed)
-----   Message from Star Age, MD sent at 01/04/2019  9:54 AM EDT ----- Patient referred by Dr. Dennard Schaumann, seen virtually by me on 11/30/18, HST on 12/29/18.    Please call and notify the patient that the recent home sleep test showed obstructive sleep apnea in the moderate range. While I recommend treatment for this in the form CPAP, we are not yet bringing patients in for in-lab testing for CPAP titration studies, due to the virus pandemic; therefore, I suggest we start him on autoPAP at home, which means, that we don't have to bring him in for a sleep study with CPAP, but will let him start using an autoPAP machine at home, through a DME company (of patient's choice, or as per insurance requirement, as per in [employer], if there are such restrictions, depending on insurance carrier). The DME representative will educate the patient on how to use the machine, how to put the mask on, etc. I have placed an order in the chart. Please send referral, talk to patient, send report to referring MD. We will need a FU in sleep clinic for 10 weeks post-PAP set up, please arrange that with me or one of our NPs.  Also, please remind patient about the importance of compliance with PAP usage; this is an Designer, industrial/product, but good compliance also helps [place] track improvements in patient's sleep related complaints and objective improvements, such as BP and weight for example or nocturia or headaches, etc. For concerns and questions about how to clean the PAP machine and the supplies and how frequently to change the hose, mask and filters, etc., patient can call the DME company, for more information, education and troubleshooting. Especially in the current situation, I recommend, patients be extra mindful about hand hygiene, handling the PAP equipment only with clean hands, wipe the mask daily, keep little one and four-legged companions (and any other pets for that matter) away from the machine and mask at all times.      Thanks,   Star Age, MD, PhD [facility])

## 2019-01-05 ENCOUNTER — Other Ambulatory Visit: Payer: Self-pay

## 2019-01-05 ENCOUNTER — Ambulatory Visit (HOSPITAL_COMMUNITY)
Admission: RE | Admit: 2019-01-05 | Discharge: 2019-01-05 | Disposition: A | Discharge status: Home / Self Care | Payer: Medicare HMO | Source: Ambulatory Visit | Attending: Vascular Surgery | Admitting: Vascular Surgery

## 2019-01-05 ENCOUNTER — Ambulatory Visit (INDEPENDENT_AMBULATORY_CARE_PROVIDER_SITE_OTHER)
Admission: RE | Admit: 2019-01-05 | Discharge: 2019-01-05 | Disposition: A | Discharge status: Home / Self Care | Payer: Medicare HMO | Source: Ambulatory Visit | Attending: Vascular Surgery | Admitting: Vascular Surgery

## 2019-01-05 DIAGNOSIS — D229 Melanocytic nevi, unspecified: Secondary | ICD-10-CM | POA: Diagnosis not present

## 2019-01-05 DIAGNOSIS — I739 Peripheral vascular disease, unspecified: Secondary | ICD-10-CM

## 2019-01-05 DIAGNOSIS — L570 Actinic keratosis: Secondary | ICD-10-CM | POA: Diagnosis not present

## 2019-01-05 DIAGNOSIS — L821 Other seborrheic keratosis: Secondary | ICD-10-CM | POA: Diagnosis not present

## 2019-01-06 DIAGNOSIS — J449 Chronic obstructive pulmonary disease, unspecified: Secondary | ICD-10-CM | POA: Diagnosis not present

## 2019-01-06 DIAGNOSIS — F17219 Nicotine dependence, cigarettes, with unspecified nicotine-induced disorders: Secondary | ICD-10-CM | POA: Diagnosis not present

## 2019-01-06 DIAGNOSIS — B965 Pseudomonas (aeruginosa) (mallei) (pseudomallei) as the cause of diseases classified elsewhere: Secondary | ICD-10-CM | POA: Diagnosis not present

## 2019-01-06 DIAGNOSIS — Z9181 History of falling: Secondary | ICD-10-CM | POA: Diagnosis not present

## 2019-01-06 DIAGNOSIS — E11622 Type 2 diabetes mellitus with other skin ulcer: Secondary | ICD-10-CM | POA: Diagnosis not present

## 2019-01-06 DIAGNOSIS — L97812 Non-pressure chronic ulcer of other part of right lower leg with fat layer exposed: Secondary | ICD-10-CM | POA: Diagnosis not present

## 2019-01-06 DIAGNOSIS — Z794 Long term (current) use of insulin: Secondary | ICD-10-CM | POA: Diagnosis not present

## 2019-01-06 DIAGNOSIS — Z85118 Personal history of other malignant neoplasm of bronchus and lung: Secondary | ICD-10-CM | POA: Diagnosis not present

## 2019-01-06 DIAGNOSIS — Z9981 Dependence on supplemental oxygen: Secondary | ICD-10-CM | POA: Diagnosis not present

## 2019-01-11 ENCOUNTER — Telehealth: Payer: Self-pay | Admitting: *Deleted

## 2019-01-11 DIAGNOSIS — E11622 Type 2 diabetes mellitus with other skin ulcer: Secondary | ICD-10-CM | POA: Diagnosis not present

## 2019-01-11 DIAGNOSIS — Z9181 History of falling: Secondary | ICD-10-CM | POA: Diagnosis not present

## 2019-01-11 DIAGNOSIS — F17219 Nicotine dependence, cigarettes, with unspecified nicotine-induced disorders: Secondary | ICD-10-CM | POA: Diagnosis not present

## 2019-01-11 DIAGNOSIS — L97812 Non-pressure chronic ulcer of other part of right lower leg with fat layer exposed: Secondary | ICD-10-CM | POA: Diagnosis not present

## 2019-01-11 DIAGNOSIS — Z794 Long term (current) use of insulin: Secondary | ICD-10-CM | POA: Diagnosis not present

## 2019-01-11 DIAGNOSIS — B965 Pseudomonas (aeruginosa) (mallei) (pseudomallei) as the cause of diseases classified elsewhere: Secondary | ICD-10-CM | POA: Diagnosis not present

## 2019-01-11 DIAGNOSIS — J449 Chronic obstructive pulmonary disease, unspecified: Secondary | ICD-10-CM | POA: Diagnosis not present

## 2019-01-11 DIAGNOSIS — G4733 Obstructive sleep apnea (adult) (pediatric): Secondary | ICD-10-CM | POA: Diagnosis not present

## 2019-01-11 DIAGNOSIS — Z85118 Personal history of other malignant neoplasm of bronchus and lung: Secondary | ICD-10-CM | POA: Diagnosis not present

## 2019-01-11 DIAGNOSIS — Z9981 Dependence on supplemental oxygen: Secondary | ICD-10-CM | POA: Diagnosis not present

## 2019-01-11 NOTE — Telephone Encounter (Signed)
Virtual Visit Pre-Appointment Phone Call  Today, I spoke with Brad Taylor and performed the following actions:  1. I explained that we are currently trying to limit exposure to the COVID-19 virus by seeing patients at home rather than in the office.  I explained that the visits are best done by video, but can be done by telephone.  I asked the patient if a virtual visit that the patient would like to try instead of coming into the office. Brad Taylor agreed to proceed with the telephone visit scheduled with Clemon Chambers, NP on 01/14/2019.      2. I confirmed the BEST phone number to call the day of the visit and- I included this in appointment notes.    I confirmed consent by  a. sending through [place] or by email the [facility] as written at the end of this message or  b. verbally as listed below. i. This visit is being performed in the setting of COVID-19. ii. All virtual visits are billed to your insurance company just like a normal visit would be.   iii. We'd like you to understand that the technology does not allow for your provider to perform an examination, and thus may limit your provider's ability to fully assess your condition.  iv. If your provider identifies any concerns that need to be evaluated in person, we will make arrangements to do so.   v. Finally, though the technology is pretty good, we cannot assure that it will always work on either your or our end, and in the setting of a video visit, we may have to convert it to a phone-only visit.  In either situation, we cannot ensure that we have a secure connection.   vi. Are you willing to proceed?"  STAFF: Did the patient verbally acknowledge consent to telehealth visit? Document YES/NO here: YES  2. I advised the patient to be prepared - I asked that the patient, on the day of his visit, record any information possible with the equipment at his home, such as blood pressure, pulse,  oxygen saturation, and your weight and write them all down. I asked the patient to have a pen and paper handy nearby the day of the visit as well.  3. If the patient was scheduled for a video visit, I informed the patient that the visit with the doctor would start with a text to the smartphone # given to [place] by the patient.         If the patient was scheduled for a telephone call, I informed the patient that the visit with the doctor would start with a call to the telephone # given to [place] by the patient.  4. I Informed patient they will receive a phone call 15 minutes prior to their appointment time from a [place] or nurse to review medications, allergies, etc. to prepare for the visit.    TELEPHONE CALL NOTE  Brad Taylor has been deemed a candidate for a follow-up tele-health visit to limit community exposure during the Covid-19 pandemic. I spoke with the patient via phone to ensure availability of phone/video source, confirm preferred email & phone number, and discuss instructions and expectations.  I reminded Brad Taylor to be prepared with any vital sign and/or heart rhythm information that could potentially be obtained via home monitoring, at the time of his visit. I reminded Brad Taylor to expect a phone call prior to his visit.  Cleaster Corin, NT 01/11/2019 4:25 PM     FULL LENGTH CONSENT FOR TELE-HEALTH VISIT   I hereby voluntarily request, consent and authorize [facility] and its employed or contracted physicians, physician assistants, nurse practitioners or other licensed health care professionals (the Practitioner), to provide me with telemedicine health care services (the "Services") as deemed necessary by the treating Practitioner. I acknowledge and consent to receive the Services by the Practitioner via telemedicine. I understand that the telemedicine visit will involve communicating with the Practitioner through live audiovisual communication technology and the  disclosure of certain medical information by electronic transmission. I acknowledge that I have been given the opportunity to request an in-person assessment or other available alternative prior to the telemedicine visit and am voluntarily participating in the telemedicine visit.  I understand that I have the right to withhold or withdraw my consent to the use of telemedicine in the course of my care at any time, without affecting my right to future care or treatment, and that the Practitioner or I may terminate the telemedicine visit at any time. I understand that I have the right to inspect all information obtained and/or recorded in the course of the telemedicine visit and may receive copies of available information for a reasonable fee.  I understand that some of the potential risks of receiving the Services via telemedicine include:  Marland Kitchen Delay or interruption in medical evaluation due to technological equipment failure or disruption; . Information transmitted may not be sufficient (e.g. poor resolution of images) to allow for appropriate medical decision making by the Practitioner; and/or  . In rare instances, security protocols could fail, causing a breach of personal health information.  Furthermore, I acknowledge that it is my responsibility to provide information about my medical history, conditions and care that is complete and accurate to the best of my ability. I acknowledge that Practitioner's advice, recommendations, and/or decision may be based on factors not within their control, such as incomplete or inaccurate data provided by me or distortions of diagnostic images or specimens that may result from electronic transmissions. I understand that the practice of medicine is not an exact science and that Practitioner makes no warranties or guarantees regarding treatment outcomes. I acknowledge that I will receive a copy of this consent concurrently upon execution via email to the email address I  last provided but may also request a printed copy by calling the office of [facility].    I understand that my insurance will be billed for this visit.   I have read or had this consent read to me. . I understand the contents of this consent, which adequately explains the benefits and risks of the Services being provided via telemedicine.  . I have been provided ample opportunity to ask questions regarding this consent and the Services and have had my questions answered to my satisfaction. . I give my informed consent for the services to be provided through the use of telemedicine in my medical care  By participating in this telemedicine visit I agree to the above.

## 2019-01-12 DIAGNOSIS — R490 Dysphonia: Secondary | ICD-10-CM | POA: Diagnosis not present

## 2019-01-12 DIAGNOSIS — K219 Gastro-esophageal reflux disease without esophagitis: Secondary | ICD-10-CM | POA: Diagnosis not present

## 2019-01-13 DIAGNOSIS — B965 Pseudomonas (aeruginosa) (mallei) (pseudomallei) as the cause of diseases classified elsewhere: Secondary | ICD-10-CM | POA: Diagnosis not present

## 2019-01-13 DIAGNOSIS — J449 Chronic obstructive pulmonary disease, unspecified: Secondary | ICD-10-CM | POA: Diagnosis not present

## 2019-01-13 DIAGNOSIS — Z9181 History of falling: Secondary | ICD-10-CM | POA: Diagnosis not present

## 2019-01-13 DIAGNOSIS — F17219 Nicotine dependence, cigarettes, with unspecified nicotine-induced disorders: Secondary | ICD-10-CM | POA: Diagnosis not present

## 2019-01-13 DIAGNOSIS — E11622 Type 2 diabetes mellitus with other skin ulcer: Secondary | ICD-10-CM | POA: Diagnosis not present

## 2019-01-13 DIAGNOSIS — Z794 Long term (current) use of insulin: Secondary | ICD-10-CM | POA: Diagnosis not present

## 2019-01-13 DIAGNOSIS — Z9981 Dependence on supplemental oxygen: Secondary | ICD-10-CM | POA: Diagnosis not present

## 2019-01-13 DIAGNOSIS — Z85118 Personal history of other malignant neoplasm of bronchus and lung: Secondary | ICD-10-CM | POA: Diagnosis not present

## 2019-01-13 DIAGNOSIS — L97812 Non-pressure chronic ulcer of other part of right lower leg with fat layer exposed: Secondary | ICD-10-CM | POA: Diagnosis not present

## 2019-01-14 ENCOUNTER — Ambulatory Visit (INDEPENDENT_AMBULATORY_CARE_PROVIDER_SITE_OTHER): Payer: Medicare HMO | Admitting: Family

## 2019-01-14 ENCOUNTER — Ambulatory Visit
Admission: EM | Admit: 2019-01-14 | Discharge: 2019-01-14 | Disposition: A | Discharge status: Home / Self Care | Payer: Medicare HMO | Attending: Emergency Medicine | Admitting: Emergency Medicine

## 2019-01-14 ENCOUNTER — Encounter: Payer: Self-pay | Admitting: Family

## 2019-01-14 ENCOUNTER — Other Ambulatory Visit: Payer: Self-pay

## 2019-01-14 VITALS — BP 114/67 | HR 56 | Temp 97.9°F | Ht 70.75 in | Wt 239.0 lb

## 2019-01-14 DIAGNOSIS — I779 Disorder of arteries and arterioles, unspecified: Secondary | ICD-10-CM

## 2019-01-14 DIAGNOSIS — E08622 Diabetes mellitus due to underlying condition with other skin ulcer: Secondary | ICD-10-CM

## 2019-01-14 DIAGNOSIS — S81801D Unspecified open wound, right lower leg, subsequent encounter: Secondary | ICD-10-CM | POA: Diagnosis not present

## 2019-01-14 DIAGNOSIS — J441 Chronic obstructive pulmonary disease with (acute) exacerbation: Secondary | ICD-10-CM | POA: Diagnosis not present

## 2019-01-14 DIAGNOSIS — I872 Venous insufficiency (chronic) (peripheral): Secondary | ICD-10-CM | POA: Diagnosis not present

## 2019-01-14 DIAGNOSIS — E11622 Type 2 diabetes mellitus with other skin ulcer: Secondary | ICD-10-CM

## 2019-01-14 DIAGNOSIS — I83813 Varicose veins of bilateral lower extremities with pain: Secondary | ICD-10-CM

## 2019-01-14 DIAGNOSIS — J440 Chronic obstructive pulmonary disease with acute lower respiratory infection: Secondary | ICD-10-CM | POA: Diagnosis not present

## 2019-01-14 DIAGNOSIS — J209 Acute bronchitis, unspecified: Secondary | ICD-10-CM | POA: Diagnosis not present

## 2019-01-14 DIAGNOSIS — L97919 Non-pressure chronic ulcer of unspecified part of right lower leg with unspecified severity: Secondary | ICD-10-CM

## 2019-01-14 MED ORDER — DOXYCYCLINE HYCLATE 100 MG PO CAPS: 100.0000 mg | ORAL_CAPSULE | Freq: Two times a day (BID) | ORAL | 0 refills | Status: DC

## 2019-01-14 MED ORDER — CEPHALEXIN 500 MG PO CAPS: 500.0000 mg | ORAL_CAPSULE | Freq: Two times a day (BID) | ORAL | 0 refills | Status: AC

## 2019-01-14 NOTE — Progress Notes (Signed)
Virtual Visit via Telephone Note  I connected with Brad Taylor on 01/14/2019 using the [employer] by telephone and verified that I was speaking with the correct person using two identifiers. Patient was located at his home and accompanied by his wife. I am located at the [facility] office on [address]. Pt is very hard of hearing, was not able to understand me on the phone, he agreed to allow me to speak with his wife.    The limitations of evaluation and management by telemedicine and the availability of in person appointments have been previously discussed with the patient and are documented in the patients chart. The patient expressed understanding and consented to proceed.  PCP: [name], MD   Chief Complaint: Follow up PAD, non invasive testing done on 01-05-19.  History of Present Illness: Brad Taylor is a 68 y.o. male who underwent laser ablation of the right lesser saphenous vein on April 08, 2018 by Dr. Oneida Alar.  He previously underwent laser ablation of the right greater saphenous vein on March 18, 2018.  This was done for a wound on his right leg.  He has known arterial occlusive disease as well which is followed by Dr. [name].  We were planning laser ablation of his left greater saphenous vein.  However, he does have underlying arterial occlusive disease and currently does not have a wound on his left leg.  He is managed at [facility] wound care center for his right lower leg wound, HH CHANGES DRESSING 3X/WEEK.  Wife states pt c/o pain at right leg wound.   0.78 creatinine on 10-28-18.   He had a stroke in 2016, has right side weakness from this, some trouble walking; some loss of dexterity in his right hand, is right hand dominant.   Since his right lower extremity arterial intervention he has had significant improvement in the ulceration.  He has been wearing compression therapy for 5 years now and seeing the wound clinic.  He now presents for venous evaluation  which was performed last March but did not demonstrate any significant size veins for intervention.  He quit smoking in February 2020, smoked about 40 years.  A1C was 8.3 most recently per wife.   Pt and wife live in [place].  Dr. [name] last evaluated pt on 11-28-17. At that time pt had mixed venous and arterial wound on the right lateral leg.  Per the patient, was improving after arterial intervention. Venous reflux study which did demonstrate significant reflux and a small saphenous vein on that side and a size of 0.5 cm. Dr. [name] advised pt to see one of our vein specialist for consideration of intervention he has bilateral lower extremities focusing on the right to prevent recurrent ulceration we can finally get this to heal. Pt was to see Dr. [name] in 6 months with right lower extremity duplex and ABIs.  He will continue on Plavix and statin.     Past Medical History:  Diagnosis Date  . AAA (abdominal aortic aneurysm) ([place]) 5/15   3.5x3.5 cm  . COPD (chronic obstructive pulmonary disease) ([place])   . Depression   . Diabetes mellitus without complication ([place])   . GERD (gastroesophageal reflux disease)   . Hyperlipidemia   . Hypothyroidism   . Post traumatic stress disorder   . PVD (peripheral vascular disease) ([place])   . Stroke ([place])   . Thoracic aortic aneurysm ([place])   . Thyroid disease   . Ulcer  Past Surgical History:  Procedure Laterality Date  . ABDOMINAL AORTOGRAM W/LOWER EXTREMITY N/A 08/20/2017   Procedure: ABDOMINAL AORTOGRAM W/LOWER EXTREMITY;  Surgeon: Waynetta Sandy, MD;  Location: [facility];  Service: Cardiovascular;  Laterality: N/A;  . CATARACT EXTRACTION    . CHOLECYSTECTOMY    . ENDOVENOUS ABLATION SAPHENOUS VEIN W/ LASER Right 03/18/2018   endovenous laser ablation R GSV by Ruta Hinds MD   . ENDOVENOUS ABLATION SAPHENOUS VEIN W/ LASER Right 04/08/2018   endovenous laser ablation R SSV by Ruta Hinds MD   . [place]    .  LUNG REMOVAL, PARTIAL     right, 2 years ago at [facility] for cancer  . PERIPHERAL VASCULAR ATHERECTOMY Right 08/20/2017   Procedure: PERIPHERAL VASCULAR ATHERECTOMY;  Surgeon: Waynetta Sandy, MD;  Location: [facility];  Service: Cardiovascular;  Laterality: Right;  posterior tibial and tibeoperoneal trunk  . PERIPHERAL VASCULAR BALLOON ANGIOPLASTY Right 08/20/2017   Procedure: PERIPHERAL VASCULAR BALLOON ANGIOPLASTY;  Surgeon: Waynetta Sandy, MD;  Location: [facility];  Service: Cardiovascular;  Laterality: Right;  Anterior tibial  . VIDEO BRONCHOSCOPY Bilateral 12/30/2017   Procedure: VIDEO BRONCHOSCOPY WITHOUT FLUORO;  Surgeon: Marshell Garfinkel, MD;  Location: [facility];  Service: Cardiopulmonary;  Laterality: Bilateral;    Current Meds  Medication Sig  . albuterol (PROVENTIL HFA;VENTOLIN HFA) 108 (90 Base) MCG/ACT inhaler INHALE 2 PUFFS INTO THE LUNGS EVERY 6 HOURS AS NEEDED FOR WHEEZE OR SHORTNESS OF BREATH  . amoxicillin (AMOXIL) 875 MG tablet Take 1 tablet (875 mg total) by mouth 2 (two) times daily.  Marland Kitchen amphetamine-dextroamphetamine (ADDERALL XR) 15 MG 24 hr capsule Take 1 capsule by mouth every morning.  Marland Kitchen atorvastatin (LIPITOR) 10 MG tablet Take 10 mg by mouth every other day.   . busPIRone (BUSPAR) 10 MG tablet Take 0.5 tablets (5 mg total) by mouth 2 (two) times daily as needed (for anxiety). (Patient taking differently: Take 10 mg by mouth 2 (two) times daily. )  . cloNIDine (CATAPRES - DOSED IN MG/24 HR) 0.1 mg/24hr patch Place 1 patch (0.1 mg total) onto the skin once a week.  . clopidogrel (PLAVIX) 75 MG tablet Take 1 tablet (75 mg total) by mouth daily with breakfast.  . fluticasone (FLONASE) 50 MCG/ACT nasal spray Place 2 sprays into both nostrils daily.  . Fluticasone-Umeclidin-Vilant (TRELEGY ELLIPTA) 100-62.5-25 MCG/INH AEPB Inhale 1 Inhaler into the lungs daily.  Marland Kitchen gabapentin (NEURONTIN) 300 MG capsule Take 1 capsule (300 mg total) by mouth 3  (three) times daily. (Patient taking differently: Take 300 mg by mouth 2 (two) times daily. )  . insulin glargine (LANTUS) 100 UNIT/ML injection Inject 45-55 Units into the skin 2 (two) times daily. Take 45 units in the morning and 55 units at night  . levothyroxine (SYNTHROID, LEVOTHROID) 125 MCG tablet Take 1 tablet (125 mcg total) by mouth daily before breakfast.  . meclizine (ANTIVERT) 25 MG tablet Take 1 tablet (25 mg total) by mouth 3 (three) times daily as needed for dizziness (vertigo).  . metFORMIN (GLUCOPHAGE) 500 MG tablet Take 500 mg by mouth 2 (two) times daily with a meal.  . metroNIDAZOLE (FLAGYL) 500 MG tablet Take 1 tablet (500 mg total) by mouth 2 (two) times daily.  Marland Kitchen morphine (MS CONTIN) 100 MG 12 hr tablet Take 100 mg by mouth every 12 (twelve) hours as needed for pain.   Marland Kitchen MOVANTIK 25 MG TABS tablet TAKE 1 TABLET BY MOUTH EVERY DAY  . oxymetazoline (  AFRIN) 0.05 % nasal spray Place 2 sprays into both nostrils 2 (two) times daily as needed for congestion.  . RABEprazole (ACIPHEX) 20 MG tablet Take 2 tablets (40 mg total) by mouth daily before breakfast.  . tamsulosin (FLOMAX) 0.4 MG CAPS capsule Take 0.4 mg by mouth at bedtime.   . Tiotropium Bromide-Olodaterol (STIOLTO RESPIMAT) 2.5-2.5 MCG/ACT AERS Inhale 2 Inhalers into the lungs daily.  . traZODone (DESYREL) 100 MG tablet Take 3 tablets (300 mg total) by mouth at bedtime.  Marland Kitchen venlafaxine XR (EFFEXOR-XR) 150 MG 24 hr capsule Take 1 capsule (150 mg total) by mouth daily with breakfast. (Patient taking differently: Take 150 mg by mouth 2 (two) times daily. )    12 system ROS was negative unless otherwise noted in HPI   Observations/Objective:  DATA  Right LE Arterial Duplex (01-05-19):  RIGHT      PSV cm/s RatioStenosisWaveform  Comments             +-----------+---------+-----+--------+----------+--------------------+ EIA Distal 143                   monophasic                      +-----------+---------+-----+--------+----------+--------------------+ CFA Prox   105                   monophasic                     +-----------+---------+-----+--------+----------+--------------------+ DFA        122                   triphasic dampened             +-----------+---------+-----+--------+----------+--------------------+ SFA Prox   102                   monophasic                     +-----------+---------+-----+--------+----------+--------------------+ SFA Mid    103 / 19.             monophasiccurve                +-----------+---------+-----+--------+----------+--------------------+ SFA Distal 78                    monophasic                     +-----------+---------+-----+--------+----------+--------------------+ POP Mid    50                    monophasic                     +-----------+---------+-----+--------+----------+--------------------+ POP Distal 69                    monophasic                     +-----------+---------+-----+--------+----------+--------------------+ TP Trunk   93                    monophasic                     +-----------+---------+-----+--------+----------+--------------------+ ATA Distal 14                    monophasicdampened             +-----------+---------+-----+--------+----------+--------------------+ PTA  Distal 41                    monophasic                     +-----------+---------+-----+--------+----------+--------------------+ PERO Distal                                not obtained / ulcer +-----------+---------+-----+--------+----------+--------------------+  +-----------+--------+-----+--------+----------+--------+ LEFT       PSV cm/sRatioStenosisWaveform  Comments +-----------+--------+-----+--------+----------+--------+ CFA Prox   191                  triphasic           +-----------+--------+-----+--------+----------+--------+ DFA        101                  triphasic          +-----------+--------+-----+--------+----------+--------+ SFA Prox   103                  triphasic          +-----------+--------+-----+--------+----------+--------+ SFA Mid    54                   biphasic           +-----------+--------+-----+--------+----------+--------+ SFA Distal 72                   biphasic           +-----------+--------+-----+--------+----------+--------+ POP Mid    41                   biphasic  dampened +-----------+--------+-----+--------+----------+--------+ POP Distal 47                                      +-----------+--------+-----+--------+----------+--------+ TP Trunk   60                   biphasic           +-----------+--------+-----+--------+----------+--------+ ATA Distal 15                   monophasicdampened +-----------+--------+-----+--------+----------+--------+ PTA Distal 56                   biphasic           +-----------+--------+-----+--------+----------+--------+ PERO Distal22                   monophasic         +-----------+--------+-----+--------+----------+--------+  Enlarged lymph nodes bilaterally. Mixed plaque noted throughout bilaterally.  Summary: Right: Probable ilio-femoral arterial occlusive disease.  Left: Probable tibial artery occlusive disease.  This was a technically difficult exam due to patient positioning.  ABI's (01-05-19): ABI Findings: +---------+------------------+-----+----------+--------+ Right    Rt Pressure (mmHg)IndexWaveform  Comment  +---------+------------------+-----+----------+--------+ Brachial 132                                       +---------+------------------+-----+----------+--------+ PTA      119               0.84 monophasic          +---------+------------------+-----+----------+--------+ DP       108  0.76 monophasic         +---------+------------------+-----+----------+--------+ Great Toe78                0.55 Abnormal           +---------+------------------+-----+----------+--------+  +---------+------------------+-----+---------+-------+ Left     Lt Pressure (mmHg)IndexWaveform Comment +---------+------------------+-----+---------+-------+ Brachial 142                                     +---------+------------------+-----+---------+-------+ PTA      153               1.08 triphasic        +---------+------------------+-----+---------+-------+ DP       141               0.99 biphasic         +---------+------------------+-----+---------+-------+ Great Toe110               0.77 Normal           +---------+------------------+-----+---------+-------+  +-------+-----------+-----------+------------+------------+ ABI/TBIToday's ABIToday's TBIPrevious ABIPrevious TBI +-------+-----------+-----------+------------+------------+ Right  0.84       0.55                                +-------+-----------+-----------+------------+------------+ Left   1.08       0.77                                +-------+-----------+-----------+------------+------------+ Summary: Right: Resting right ankle-brachial index indicates mild right lower extremity arterial disease. The right toe-brachial index is abnormal.  Left: Resting left ankle-brachial index is within normal range. No evidence of significant left lower extremity arterial disease. The left toe-brachial index is normal. No previous for comparison.    Assessment and Plan: Monophasic waveforms in right ABI, not c/w 0.85 ABI. Wife states that wound in right calf is not improving much, and frequently gets infected, pt has stable or worsening pain at his right calf wound.  Left ABI is normal with  biphasic waveforms, pt has no problems in his left LE.  His serum creatinine is normal as of March 2020. Instead of scheduling an arteriogram, I ask that pt and his wife be scheduled to see Dr. [name] ASAP for discussion of options to improve perfusion of right LE.   Follow Up Instructions:   Pt and his wife be scheduled to see Dr. [name] ASAP for discussion of options to improve perfusion of right LE.    I discussed the assessment and treatment plan with the patient. The patient was provided an opportunity to ask questions and all were answered. The patient agreed with the plan and demonstrated an understanding of the instructions.   The patient was advised to call back or seek an in-person evaluation if the symptoms worsen or if the condition fails to improve as anticipated.  I spent 12 minutes with the wife via telephone encounter.   Gabrielle Dare Roben T[facility]ch [facility] Office: [phone]  01/14/2019, 9:52 AM

## 2019-01-14 NOTE — ED Triage Notes (Signed)
pts wound evaluated by home health yesterday, advised wound edges appeared infected. Wound tx ceneter request pt to be seen at urgent care to determine if antibiotic needed

## 2019-01-14 NOTE — Discharge Instructions (Addendum)
Bandage applied Keep covered Will treat for possible infection today Keflex and doxycycline prescribed.  Take as directed and to completion Follow up with wound management tomorrow for reevaluation and to ensure symptoms are improving Go to the ER if you have any new or worsening symptoms such as fever, chills, nausea, vomiting, increased redness, increased swelling, discharge, if symptoms do not improve with medications, etc..Marland Kitchen

## 2019-01-14 NOTE — ED Provider Notes (Signed)
[place]   [id number] 01/14/19 Arrival Time: 1032  CC: wound check  SUBJECTIVE:  Brad Taylor is a 68 y.o. male hx significant for AAA, COPD, depression, DM, GERD, HLD, hypothyroid, PTSD, PVD, stroke, thyroid disease, and ulcer, who presents for wound check of nonhealing diabetic ulcer x 3-4 years.  Localizes the wound to RT LE.  Describes it as painful, with increased redness and swelling.  Was evaluated by home health yesterday, and they were concerned for possible infection.  Had e-visit with [name] NP at the vascular center today.  Per her note, was instructed to follow up with Dr. [name] for further evaluation and management ASAP for poor perfusion.  Has appointment with wound management provider tomorrow.   Denies fever, chills, nausea, vomiting, discharge, SOB, chest pain, abdominal pain, changes in bowel or bladder function.    ROS: As per HPI.  Past Medical History:  Diagnosis Date  . AAA (abdominal aortic aneurysm) ([place]) 5/15   3.5x3.5 cm  . COPD (chronic obstructive pulmonary disease) ([place])   . Depression   . Diabetes mellitus without complication ([place])   . GERD (gastroesophageal reflux disease)   . Hyperlipidemia   . Hypothyroidism   . Post traumatic stress disorder   . PVD (peripheral vascular disease) ([place])   . Stroke ([place])   . Thoracic aortic aneurysm ([place])   . Thyroid disease   . Ulcer    Past Surgical History:  Procedure Laterality Date  . ABDOMINAL AORTOGRAM W/LOWER EXTREMITY N/A 08/20/2017   Procedure: ABDOMINAL AORTOGRAM W/LOWER EXTREMITY;  Surgeon: Waynetta Sandy, MD;  Location: [place] [facility];  Service: Cardiovascular;  Laterality: N/A;  . CATARACT EXTRACTION    . CHOLECYSTECTOMY    . ENDOVENOUS ABLATION SAPHENOUS VEIN W/ LASER Right 03/18/2018   endovenous laser ablation R GSV by Ruta Hinds MD   . ENDOVENOUS ABLATION SAPHENOUS VEIN W/ LASER Right 04/08/2018   endovenous laser ablation R SSV by Ruta Hinds MD    . [place]    . LUNG REMOVAL, PARTIAL     right, 2 years ago at [facility] for cancer  . PERIPHERAL VASCULAR ATHERECTOMY Right 08/20/2017   Procedure: PERIPHERAL VASCULAR ATHERECTOMY;  Surgeon: Waynetta Sandy, MD;  Location: [place] [facility] LAB;  Service: Cardiovascular;  Laterality: Right;  posterior tibial and tibeoperoneal trunk  . PERIPHERAL VASCULAR BALLOON ANGIOPLASTY Right 08/20/2017   Procedure: PERIPHERAL VASCULAR BALLOON ANGIOPLASTY;  Surgeon: Waynetta Sandy, MD;  Location: [place] [facility];  Service: Cardiovascular;  Laterality: Right;  Anterior tibial  . VIDEO BRONCHOSCOPY Bilateral 12/30/2017   Procedure: VIDEO BRONCHOSCOPY WITHOUT FLUORO;  Surgeon: Marshell Garfinkel, MD;  Location: [place] [facility];  Service: Cardiopulmonary;  Laterality: Bilateral;   No Known Allergies No current facility-administered medications on file prior to encounter.    Current Outpatient Medications on File Prior to Encounter  Medication Sig Dispense Refill  . albuterol (PROVENTIL HFA;VENTOLIN HFA) 108 (90 Base) MCG/ACT inhaler INHALE 2 PUFFS INTO THE LUNGS EVERY 6 HOURS AS NEEDED FOR WHEEZE OR SHORTNESS OF BREATH 18 g 3  . amphetamine-dextroamphetamine (ADDERALL XR) 15 MG 24 hr capsule Take 1 capsule by mouth every morning. 30 capsule 0  . atorvastatin (LIPITOR) 10 MG tablet Take 10 mg by mouth every other day.     . busPIRone (BUSPAR) 10 MG tablet Take 0.5 tablets (5 mg total) by mouth 2 (two) times daily as needed (for anxiety). (Patient taking differently: Take 10 mg by mouth 2 (two) times daily. )  60 tablet 0  . cloNIDine (CATAPRES - DOSED IN MG/24 HR) 0.1 mg/24hr patch Place 1 patch (0.1 mg total) onto the skin once a week. 4 patch 12  . clopidogrel (PLAVIX) 75 MG tablet Take 1 tablet (75 mg total) by mouth daily with breakfast. 90 tablet 3  . fluticasone (FLONASE) 50 MCG/ACT nasal spray Place 2 sprays into both nostrils daily. 16 g 6  . Fluticasone-Umeclidin-Vilant (TRELEGY ELLIPTA)  100-62.5-25 MCG/INH AEPB Inhale 1 Inhaler into the lungs daily. 1 each 11  . gabapentin (NEURONTIN) 300 MG capsule Take 1 capsule (300 mg total) by mouth 3 (three) times daily. (Patient taking differently: Take 300 mg by mouth 2 (two) times daily. ) 90 capsule 3  . insulin glargine (LANTUS) 100 UNIT/ML injection Inject 45-55 Units into the skin 2 (two) times daily. Take 45 units in the morning and 55 units at night    . levothyroxine (SYNTHROID, LEVOTHROID) 125 MCG tablet Take 1 tablet (125 mcg total) by mouth daily before breakfast. 30 tablet 1  . meclizine (ANTIVERT) 25 MG tablet Take 1 tablet (25 mg total) by mouth 3 (three) times daily as needed for dizziness (vertigo). 30 tablet 0  . metFORMIN (GLUCOPHAGE) 500 MG tablet Take 500 mg by mouth 2 (two) times daily with a meal.    . metroNIDAZOLE (FLAGYL) 500 MG tablet Take 1 tablet (500 mg total) by mouth 2 (two) times daily. 20 tablet 0  . morphine (MS CONTIN) 100 MG 12 hr tablet Take 100 mg by mouth every 12 (twelve) hours as needed for pain.     Marland Kitchen MOVANTIK 25 MG TABS tablet TAKE 1 TABLET BY MOUTH EVERY DAY 30 tablet 5  . oxymetazoline (AFRIN) 0.05 % nasal spray Place 2 sprays into both nostrils 2 (two) times daily as needed for congestion.    . RABEprazole (ACIPHEX) 20 MG tablet Take 2 tablets (40 mg total) by mouth daily before breakfast. 30 tablet 1  . tamsulosin (FLOMAX) 0.4 MG CAPS capsule Take 0.4 mg by mouth at bedtime.     . Tiotropium Bromide-Olodaterol (STIOLTO RESPIMAT) 2.5-2.5 MCG/ACT AERS Inhale 2 Inhalers into the lungs daily. 12 g 3  . traZODone (DESYREL) 100 MG tablet Take 3 tablets (300 mg total) by mouth at bedtime. 30 tablet 0  . venlafaxine XR (EFFEXOR-XR) 150 MG 24 hr capsule Take 1 capsule (150 mg total) by mouth daily with breakfast. (Patient taking differently: Take 150 mg by mouth 2 (two) times daily. ) 30 capsule 1   Social History   Socioeconomic History  . Marital status: Married    Spouse name: Diane  . Number of  children: 1  . Years of education: HS  . Highest education level: Not on file  Occupational History  . Occupation: Retired Dealer    Comment: [employer]  Social Needs  . Financial resource strain: Not on file  . Food insecurity    Worry: Not on file    Inability: Not on file  . Transportation needs    Medical: Not on file    Non-medical: Not on file  Tobacco Use  . Smoking status: Current Every Day Smoker    Packs/day: 1.00    Years: 30.00    Pack years: 30.00    Types: Cigarettes  . Smokeless tobacco: Former Systems developer    Quit date: 05/04/2013  . Tobacco comment: Less than 1/2 pk per day  Substance and Sexual Activity  . Alcohol use: No    Comment: Former heavy drinker,  been sober for 10 years  . Drug use: No  . Sexual activity: Not on file  Lifestyle  . Physical activity    Days per week: Not on file    Minutes per session: Not on file  . Stress: Not on file  Relationships  . Social Herbalist on phone: Not on file    Gets together: Not on file    Attends religious service: Not on file    Active member of club or organization: Not on file    Attends meetings of clubs or organizations: Not on file    Relationship status: Not on file  . Intimate partner violence    Fear of current or ex partner: Not on file    Emotionally abused: Not on file    Physically abused: Not on file    Forced sexual activity: Not on file  Other Topics Concern  . Not on file  Social History Narrative   Patient lives at home with spouse.   Caffeine Use: 3-4 cups daily   Retired Dealer.   Family History  Problem Relation Age of Onset  . Emphysema Father   . Thyroid disease Sister   . Stroke Maternal Grandmother   . Thyroid disease Sister   . Thyroid disease Sister     OBJECTIVE: Vitals:   01/14/19 1042  BP: 118/81  Pulse: (!) 55  Resp: 20  Temp: 98.1 F (36.7 C)  SpO2: 93%    General appearance: alert; no distress Head: NCAT Lungs: clear to auscultation  bilaterally CV: 1+ dorsalis pedis pulse; cap refill >4 seconds Extremities: no edema Skin: warm and dry; approximately 5 cm wound to RT LE with surrounding erythema and swelling, no obvious discharge or bleeding, significantly tender with removing bandage (see pictures below) Psychological: alert and cooperative; normal mood and affect        ASSESSMENT & PLAN:  1. Diabetic ulcer of right lower leg associated with diabetes mellitus due to underlying condition ([place])     Meds ordered this encounter  Medications  . doxycycline (VIBRAMYCIN) 100 MG capsule    Sig: Take 1 capsule (100 mg total) by mouth 2 (two) times daily.    Dispense:  20 capsule    Refill:  0    Order Specific Question:   Supervising Provider    Answer:   Raylene Everts [[id number]]  . cephALEXin (KEFLEX) 500 MG capsule    Sig: Take 1 capsule (500 mg total) by mouth 2 (two) times daily for 10 days.    Dispense:  20 capsule    Refill:  0    Order Specific Question:   Supervising Provider    Answer:   Raylene Everts [[id number]]   Bandage applied Keep covered Will treat for possible infection today Keflex and doxycycline prescribed.  Take as directed and to completion Follow up with wound management tomorrow for reevaluation and to ensure symptoms are improving Go to the ER if you have any new or worsening symptoms such as fever, chills, nausea, vomiting, increased redness, increased swelling, discharge, if symptoms do not improve with medications, etc...  Instructed patient's wife to bring antibiotic prescriptions with patient tomorrow to wound management appointment  Reviewed expectations re: course of current medical issues. Questions answered. Outlined signs and symptoms indicating need for more acute intervention. Patient verbalized understanding. After Visit Summary given.   Lestine Box, PA-C 01/14/19 1134

## 2019-01-15 DIAGNOSIS — B965 Pseudomonas (aeruginosa) (mallei) (pseudomallei) as the cause of diseases classified elsewhere: Secondary | ICD-10-CM | POA: Diagnosis not present

## 2019-01-15 DIAGNOSIS — Z794 Long term (current) use of insulin: Secondary | ICD-10-CM | POA: Diagnosis not present

## 2019-01-15 DIAGNOSIS — J449 Chronic obstructive pulmonary disease, unspecified: Secondary | ICD-10-CM | POA: Diagnosis not present

## 2019-01-15 DIAGNOSIS — E11622 Type 2 diabetes mellitus with other skin ulcer: Secondary | ICD-10-CM | POA: Diagnosis not present

## 2019-01-15 DIAGNOSIS — Z85118 Personal history of other malignant neoplasm of bronchus and lung: Secondary | ICD-10-CM | POA: Diagnosis not present

## 2019-01-15 DIAGNOSIS — L97812 Non-pressure chronic ulcer of other part of right lower leg with fat layer exposed: Secondary | ICD-10-CM | POA: Diagnosis not present

## 2019-01-15 DIAGNOSIS — F17219 Nicotine dependence, cigarettes, with unspecified nicotine-induced disorders: Secondary | ICD-10-CM | POA: Diagnosis not present

## 2019-01-15 DIAGNOSIS — Z9981 Dependence on supplemental oxygen: Secondary | ICD-10-CM | POA: Diagnosis not present

## 2019-01-15 DIAGNOSIS — Z9181 History of falling: Secondary | ICD-10-CM | POA: Diagnosis not present

## 2019-01-18 DIAGNOSIS — Z9981 Dependence on supplemental oxygen: Secondary | ICD-10-CM | POA: Diagnosis not present

## 2019-01-18 DIAGNOSIS — E11622 Type 2 diabetes mellitus with other skin ulcer: Secondary | ICD-10-CM | POA: Diagnosis not present

## 2019-01-18 DIAGNOSIS — B965 Pseudomonas (aeruginosa) (mallei) (pseudomallei) as the cause of diseases classified elsewhere: Secondary | ICD-10-CM | POA: Diagnosis not present

## 2019-01-18 DIAGNOSIS — L97812 Non-pressure chronic ulcer of other part of right lower leg with fat layer exposed: Secondary | ICD-10-CM | POA: Diagnosis not present

## 2019-01-18 DIAGNOSIS — Z9181 History of falling: Secondary | ICD-10-CM | POA: Diagnosis not present

## 2019-01-18 DIAGNOSIS — F17219 Nicotine dependence, cigarettes, with unspecified nicotine-induced disorders: Secondary | ICD-10-CM | POA: Diagnosis not present

## 2019-01-18 DIAGNOSIS — Z85118 Personal history of other malignant neoplasm of bronchus and lung: Secondary | ICD-10-CM | POA: Diagnosis not present

## 2019-01-18 DIAGNOSIS — Z794 Long term (current) use of insulin: Secondary | ICD-10-CM | POA: Diagnosis not present

## 2019-01-18 DIAGNOSIS — J449 Chronic obstructive pulmonary disease, unspecified: Secondary | ICD-10-CM | POA: Diagnosis not present

## 2019-01-20 DIAGNOSIS — L97812 Non-pressure chronic ulcer of other part of right lower leg with fat layer exposed: Secondary | ICD-10-CM | POA: Diagnosis not present

## 2019-01-20 DIAGNOSIS — Z9981 Dependence on supplemental oxygen: Secondary | ICD-10-CM | POA: Diagnosis not present

## 2019-01-20 DIAGNOSIS — E11622 Type 2 diabetes mellitus with other skin ulcer: Secondary | ICD-10-CM | POA: Diagnosis not present

## 2019-01-20 DIAGNOSIS — F17219 Nicotine dependence, cigarettes, with unspecified nicotine-induced disorders: Secondary | ICD-10-CM | POA: Diagnosis not present

## 2019-01-20 DIAGNOSIS — Z9181 History of falling: Secondary | ICD-10-CM | POA: Diagnosis not present

## 2019-01-20 DIAGNOSIS — Z794 Long term (current) use of insulin: Secondary | ICD-10-CM | POA: Diagnosis not present

## 2019-01-20 DIAGNOSIS — Z85118 Personal history of other malignant neoplasm of bronchus and lung: Secondary | ICD-10-CM | POA: Diagnosis not present

## 2019-01-20 DIAGNOSIS — J449 Chronic obstructive pulmonary disease, unspecified: Secondary | ICD-10-CM | POA: Diagnosis not present

## 2019-01-20 DIAGNOSIS — B965 Pseudomonas (aeruginosa) (mallei) (pseudomallei) as the cause of diseases classified elsewhere: Secondary | ICD-10-CM | POA: Diagnosis not present

## 2019-01-21 ENCOUNTER — Telehealth: Payer: Self-pay | Admitting: *Deleted

## 2019-01-21 NOTE — Telephone Encounter (Signed)
Virtual Visit Pre-Appointment Phone Call  Today, I spoke with Brad Taylor spouse Brad Taylor and performed the following actions:  1. I explained that we are currently trying to limit exposure to the COVID-19 virus by seeing patients at home rather than in the office.  I explained that the visits are best done by video, but can be done by telephone.  I asked the patient if a virtual visit that the patient would like to try instead of coming into the office. Brad Taylor agreed to proceed with the virtual visit scheduled with Dr. Servando Snare on 01/22/2019.    2.       If the patient said yes, I continued with 2 (below).  I confirmed the BEST phone number to call the day of the visit and- I included this in appointment notes.  3. I asked if the patient had access to (through a family member/friend) a smartphone with video capability to be used for his visit?"  The patient said yes -       I documented "PHONE" in the appointment notes.  4. I confirmed consent by  a. sending through [place] or by email the [place] as written at the end of this message or  b. verbally as listed below. i. This visit is being performed in the setting of COVID-19. ii. All virtual visits are billed to your insurance company just like a normal visit would be.   iii. We'd like you to understand that the technology does not allow for your provider to perform an examination, and thus may limit your provider's ability to fully assess your condition.  iv. If your provider identifies any concerns that need to be evaluated in person, we will make arrangements to do so.   v. Finally, though the technology is pretty good, we cannot assure that it will always work on either your or our end, and in the setting of a video visit, we may have to convert it to a phone-only visit.  In either situation, we cannot ensure that we have a secure connection.   vi. Are you willing to proceed?"   STAFF: Did the patient verbally acknowledge consent to telehealth visit? Document YES/NO here: YES  2. I advised the patient to be prepared - I asked that the patient, on the day of his visit, record any information possible with the equipment at his home, such as blood pressure, pulse, oxygen saturation, and your weight and write them all down. I asked the patient to have a pen and paper handy nearby the day of the visit as well.  3. If the patient was scheduled for a video visit, I informed the patient that the visit with the doctor would start with a text to the smartphone # given to [place] by the patient.         If the patient was scheduled for a telephone call, I informed the patient that the visit with the doctor would start with a call to the telephone # given to [place] by the patient.  4. I Informed patient they will receive a phone call 15 minutes prior to their appointment time from a [place] or nurse to review medications, allergies, etc. to prepare for the visit.    TELEPHONE CALL NOTE  Brad Taylor has been deemed a candidate for a follow-up tele-health visit to limit community exposure during the Covid-19 pandemic. I spoke with the patient via phone  to ensure availability of phone/video source, confirm preferred email & phone number, and discuss instructions and expectations.  I reminded Brad Taylor to be prepared with any vital sign and/or heart rhythm information that could potentially be obtained via home monitoring, at the time of his visit. I reminded Brad Taylor to expect a phone call prior to his visit.  Cleaster Corin, NT 01/21/2019 9:50 AM     FULL LENGTH CONSENT FOR TELE-HEALTH VISIT   I hereby voluntarily request, consent and authorize [facility] and its employed or contracted physicians, physician assistants, nurse practitioners or other licensed health care professionals (the Practitioner), to provide me with telemedicine health care services (the "Services")  as deemed necessary by the treating Practitioner. I acknowledge and consent to receive the Services by the Practitioner via telemedicine. I understand that the telemedicine visit will involve communicating with the Practitioner through live audiovisual communication technology and the disclosure of certain medical information by electronic transmission. I acknowledge that I have been given the opportunity to request an in-person assessment or other available alternative prior to the telemedicine visit and am voluntarily participating in the telemedicine visit.  I understand that I have the right to withhold or withdraw my consent to the use of telemedicine in the course of my care at any time, without affecting my right to future care or treatment, and that the Practitioner or I may terminate the telemedicine visit at any time. I understand that I have the right to inspect all information obtained and/or recorded in the course of the telemedicine visit and may receive copies of available information for a reasonable fee.  I understand that some of the potential risks of receiving the Services via telemedicine include:  Marland Kitchen Delay or interruption in medical evaluation due to technological equipment failure or disruption; . Information transmitted may not be sufficient (e.g. poor resolution of images) to allow for appropriate medical decision making by the Practitioner; and/or  . In rare instances, security protocols could fail, causing a breach of personal health information.  Furthermore, I acknowledge that it is my responsibility to provide information about my medical history, conditions and care that is complete and accurate to the best of my ability. I acknowledge that Practitioner's advice, recommendations, and/or decision may be based on factors not within their control, such as incomplete or inaccurate data provided by me or distortions of diagnostic images or specimens that may result from electronic  transmissions. I understand that the practice of medicine is not an exact science and that Practitioner makes no warranties or guarantees regarding treatment outcomes. I acknowledge that I will receive a copy of this consent concurrently upon execution via email to the email address I last provided but may also request a printed copy by calling the office of [place].    I understand that my insurance will be billed for this visit.   I have read or had this consent read to me. . I understand the contents of this consent, which adequately explains the benefits and risks of the Services being provided via telemedicine.  . I have been provided ample opportunity to ask questions regarding this consent and the Services and have had my questions answered to my satisfaction. . I give my informed consent for the services to be provided through the use of telemedicine in my medical care  By participating in this telemedicine visit I agree to the above.

## 2019-01-22 ENCOUNTER — Other Ambulatory Visit: Payer: Self-pay

## 2019-01-22 ENCOUNTER — Ambulatory Visit (INDEPENDENT_AMBULATORY_CARE_PROVIDER_SITE_OTHER): Payer: Medicare HMO | Admitting: Vascular Surgery

## 2019-01-22 ENCOUNTER — Encounter: Payer: Self-pay | Admitting: Vascular Surgery

## 2019-01-22 DIAGNOSIS — J449 Chronic obstructive pulmonary disease, unspecified: Secondary | ICD-10-CM | POA: Diagnosis not present

## 2019-01-22 DIAGNOSIS — Z794 Long term (current) use of insulin: Secondary | ICD-10-CM | POA: Diagnosis not present

## 2019-01-22 DIAGNOSIS — I779 Disorder of arteries and arterioles, unspecified: Secondary | ICD-10-CM | POA: Diagnosis not present

## 2019-01-22 DIAGNOSIS — S81801D Unspecified open wound, right lower leg, subsequent encounter: Secondary | ICD-10-CM

## 2019-01-22 DIAGNOSIS — L97812 Non-pressure chronic ulcer of other part of right lower leg with fat layer exposed: Secondary | ICD-10-CM | POA: Diagnosis not present

## 2019-01-22 DIAGNOSIS — Z85118 Personal history of other malignant neoplasm of bronchus and lung: Secondary | ICD-10-CM | POA: Diagnosis not present

## 2019-01-22 DIAGNOSIS — Z9181 History of falling: Secondary | ICD-10-CM | POA: Diagnosis not present

## 2019-01-22 DIAGNOSIS — E11622 Type 2 diabetes mellitus with other skin ulcer: Secondary | ICD-10-CM | POA: Diagnosis not present

## 2019-01-22 DIAGNOSIS — F17219 Nicotine dependence, cigarettes, with unspecified nicotine-induced disorders: Secondary | ICD-10-CM | POA: Diagnosis not present

## 2019-01-22 DIAGNOSIS — B965 Pseudomonas (aeruginosa) (mallei) (pseudomallei) as the cause of diseases classified elsewhere: Secondary | ICD-10-CM | POA: Diagnosis not present

## 2019-01-22 DIAGNOSIS — Z9981 Dependence on supplemental oxygen: Secondary | ICD-10-CM | POA: Diagnosis not present

## 2019-01-22 NOTE — Progress Notes (Signed)
Virtual Visit via Telephone Note    I connected with Gladis Taylor on 01/22/2019 using the [email or web address] by telephone and verified that I was speaking with the correct person using two identifiers. Patient was located at home and accompanied by his wife. I am located at the office.   The limitations of evaluation and management by telemedicine and the availability of in person appointments have been previously discussed with the patient and are documented in the patients chart. The patient expressed understanding and consented to proceed.  PCP: [name], MD  Chief Complaint: Right leg wound  History of Present Illness: Brad Taylor is a 68 y.o. 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  He is undergone tibial intervention on the right as well as small and greater saphenous vein ablation.  He has not had any wounds on the left.  Currently seeing [facility] wound care.  Has undergone ABIs and right lower extremity duplex here a few weeks ago.  Past Medical History:  Diagnosis Date  . AAA (abdominal aortic aneurysm) ([place]) 5/15   3.5x3.5 cm  . COPD (chronic obstructive pulmonary disease) ([place])   . Depression   . Diabetes mellitus without complication ([place])   . GERD (gastroesophageal reflux disease)   . Hyperlipidemia   . Hypothyroidism   . Post traumatic stress disorder   . PVD (peripheral vascular disease) ([place])   . Stroke ([place])   . Thoracic aortic aneurysm ([place])   . Thyroid disease   . Ulcer     Past Surgical History:  Procedure Laterality Date  . ABDOMINAL AORTOGRAM W/LOWER EXTREMITY N/A 08/20/2017   Procedure: ABDOMINAL AORTOGRAM W/LOWER EXTREMITY;  Surgeon: Waynetta Sandy, MD;  Location: [place] [facility];  Service: Cardiovascular;  Laterality: N/A;  . CATARACT EXTRACTION    . CHOLECYSTECTOMY    . ENDOVENOUS ABLATION SAPHENOUS VEIN W/ LASER Right 03/18/2018   endovenous laser ablation R GSV by Ruta Hinds MD   . ENDOVENOUS  ABLATION SAPHENOUS VEIN W/ LASER Right 04/08/2018   endovenous laser ablation R SSV by Ruta Hinds MD   . [place]    . LUNG REMOVAL, PARTIAL     right, 2 years ago at [facility] for [facility]  . PERIPHERAL VASCULAR ATHERECTOMY Right 08/20/2017   Procedure: PERIPHERAL VASCULAR ATHERECTOMY;  Surgeon: Waynetta Sandy, MD;  Location: [place] [facility] LAB;  Service: Cardiovascular;  Laterality: Right;  posterior tibial and tibeoperoneal trunk  . PERIPHERAL VASCULAR BALLOON ANGIOPLASTY Right 08/20/2017   Procedure: PERIPHERAL VASCULAR BALLOON ANGIOPLASTY;  Surgeon: Waynetta Sandy, MD;  Location: [place] [facility];  Service: Cardiovascular;  Laterality: Right;  Anterior tibial  . VIDEO BRONCHOSCOPY Bilateral 12/30/2017   Procedure: VIDEO BRONCHOSCOPY WITHOUT FLUORO;  Surgeon: Marshell Garfinkel, MD;  Location: [facility];  Service: Cardiopulmonary;  Laterality: Bilateral;    Current Meds  Medication Sig  . albuterol (PROVENTIL HFA;VENTOLIN HFA) 108 (90 Base) MCG/ACT inhaler INHALE 2 PUFFS INTO THE LUNGS EVERY 6 HOURS AS NEEDED FOR WHEEZE OR SHORTNESS OF BREATH  . amphetamine-dextroamphetamine (ADDERALL XR) 15 MG 24 hr capsule Take 1 capsule by mouth every morning.  Marland Kitchen atorvastatin (LIPITOR) 10 MG tablet Take 10 mg by mouth every other day.   . busPIRone (BUSPAR) 10 MG tablet Take 0.5 tablets (5 mg total) by mouth 2 (two) times daily as needed (for anxiety). (Patient taking differently: Take 10 mg by mouth 2 (two) times daily. )  . cephALEXin (KEFLEX) 500 MG  capsule Take 1 capsule (500 mg total) by mouth 2 (two) times daily for 10 days.  . cloNIDine (CATAPRES - DOSED IN MG/24 HR) 0.1 mg/24hr patch Place 1 patch (0.1 mg total) onto the skin once a week.  . clopidogrel (PLAVIX) 75 MG tablet Take 1 tablet (75 mg total) by mouth daily with breakfast.  . doxycycline (VIBRAMYCIN) 100 MG capsule Take 1 capsule (100 mg total) by mouth 2 (two) times daily.  . fluticasone (FLONASE) 50 MCG/ACT nasal  spray Place 2 sprays into both nostrils daily.  . Fluticasone-Umeclidin-Vilant (TRELEGY ELLIPTA) 100-62.5-25 MCG/INH AEPB Inhale 1 Inhaler into the lungs daily.  Marland Kitchen gabapentin (NEURONTIN) 300 MG capsule Take 1 capsule (300 mg total) by mouth 3 (three) times daily. (Patient taking differently: Take 300 mg by mouth 2 (two) times daily. )  . insulin glargine (LANTUS) 100 UNIT/ML injection Inject 45-55 Units into the skin 2 (two) times daily. Take 45 units in the morning and 55 units at night  . levothyroxine (SYNTHROID, LEVOTHROID) 125 MCG tablet Take 1 tablet (125 mcg total) by mouth daily before breakfast.  . meclizine (ANTIVERT) 25 MG tablet Take 1 tablet (25 mg total) by mouth 3 (three) times daily as needed for dizziness (vertigo).  . metFORMIN (GLUCOPHAGE) 500 MG tablet Take 500 mg by mouth 2 (two) times daily with a meal.  . metroNIDAZOLE (FLAGYL) 500 MG tablet Take 1 tablet (500 mg total) by mouth 2 (two) times daily.  Marland Kitchen morphine (MS CONTIN) 100 MG 12 hr tablet Take 100 mg by mouth every 12 (twelve) hours as needed for pain.   Marland Kitchen MOVANTIK 25 MG TABS tablet TAKE 1 TABLET BY MOUTH EVERY DAY  . oxymetazoline (AFRIN) 0.05 % nasal spray Place 2 sprays into both nostrils 2 (two) times daily as needed for congestion.  . RABEprazole (ACIPHEX) 20 MG tablet Take 2 tablets (40 mg total) by mouth daily before breakfast.  . tamsulosin (FLOMAX) 0.4 MG CAPS capsule Take 0.4 mg by mouth at bedtime.   . Tiotropium Bromide-Olodaterol (STIOLTO RESPIMAT) 2.5-2.5 MCG/ACT AERS Inhale 2 Inhalers into the lungs daily.  . traZODone (DESYREL) 100 MG tablet Take 3 tablets (300 mg total) by mouth at bedtime.  Marland Kitchen venlafaxine XR (EFFEXOR-XR) 150 MG 24 hr capsule Take 1 capsule (150 mg total) by mouth daily with breakfast. (Patient taking differently: Take 150 mg by mouth 2 (two) times daily. )    12 system ROS was negative unless otherwise noted in HPI   Observations/Objective: He is awake alert and oriented by phone  Complains of right leg wound  I reviewed his previous ABIs and right lower extremity duplex which demonstrates monophasic waveforms throughout with an ABI of 0.8 on the right.  On the left he has triphasic waveforms with an ABI of 1  Assessment and Plan: 68 year old male with mixed arterial and venous ulceration on the right lower extremity.  Has undergone saphenous vein ablation of the right and is undergone tibial intervention as well.  Appears to have inflow issues.  I discussed proceeding with angiography to improve his blood flow and he agrees.  We will get him scheduled in the near future.   I discussed the assessment and treatment plan with the patient. The patient was provided an opportunity to ask questions and all were answered. The patient agreed with the plan and demonstrated an understanding of the instructions.   The patient was advised to call back or seek an in-person evaluation if the symptoms worsen or if the  condition fails to improve as anticipated.  I spent 8 minutes with the patient via telephone encounter.   Signed, Servando Snare [facility] Office: [phone]  01/22/2019, 8:52 AM

## 2019-01-25 DIAGNOSIS — I83018 Varicose veins of right lower extremity with ulcer other part of lower leg: Secondary | ICD-10-CM | POA: Diagnosis not present

## 2019-01-25 DIAGNOSIS — L97819 Non-pressure chronic ulcer of other part of right lower leg with unspecified severity: Secondary | ICD-10-CM | POA: Diagnosis not present

## 2019-01-25 DIAGNOSIS — I890 Lymphedema, not elsewhere classified: Secondary | ICD-10-CM | POA: Diagnosis not present

## 2019-01-25 DIAGNOSIS — I83891 Varicose veins of right lower extremities with other complications: Secondary | ICD-10-CM | POA: Diagnosis not present

## 2019-01-25 DIAGNOSIS — R609 Edema, unspecified: Secondary | ICD-10-CM | POA: Diagnosis not present

## 2019-01-25 DIAGNOSIS — I8311 Varicose veins of right lower extremity with inflammation: Secondary | ICD-10-CM | POA: Diagnosis not present

## 2019-01-25 DIAGNOSIS — B353 Tinea pedis: Secondary | ICD-10-CM | POA: Diagnosis not present

## 2019-01-25 DIAGNOSIS — I70238 Atherosclerosis of native arteries of right leg with ulceration of other part of lower right leg: Secondary | ICD-10-CM | POA: Diagnosis not present

## 2019-01-27 DIAGNOSIS — Z9981 Dependence on supplemental oxygen: Secondary | ICD-10-CM | POA: Diagnosis not present

## 2019-01-27 DIAGNOSIS — Z794 Long term (current) use of insulin: Secondary | ICD-10-CM | POA: Diagnosis not present

## 2019-01-27 DIAGNOSIS — B965 Pseudomonas (aeruginosa) (mallei) (pseudomallei) as the cause of diseases classified elsewhere: Secondary | ICD-10-CM | POA: Diagnosis not present

## 2019-01-27 DIAGNOSIS — Z9181 History of falling: Secondary | ICD-10-CM | POA: Diagnosis not present

## 2019-01-27 DIAGNOSIS — Z85118 Personal history of other malignant neoplasm of bronchus and lung: Secondary | ICD-10-CM | POA: Diagnosis not present

## 2019-01-27 DIAGNOSIS — F17219 Nicotine dependence, cigarettes, with unspecified nicotine-induced disorders: Secondary | ICD-10-CM | POA: Diagnosis not present

## 2019-01-27 DIAGNOSIS — E11622 Type 2 diabetes mellitus with other skin ulcer: Secondary | ICD-10-CM | POA: Diagnosis not present

## 2019-01-27 DIAGNOSIS — L97812 Non-pressure chronic ulcer of other part of right lower leg with fat layer exposed: Secondary | ICD-10-CM | POA: Diagnosis not present

## 2019-01-27 DIAGNOSIS — J449 Chronic obstructive pulmonary disease, unspecified: Secondary | ICD-10-CM | POA: Diagnosis not present

## 2019-01-29 ENCOUNTER — Other Ambulatory Visit (HOSPITAL_COMMUNITY)
Admission: RE | Admit: 2019-01-29 | Discharge: 2019-01-29 | Disposition: A | Discharge status: Home / Self Care | Payer: Medicare HMO | Source: Ambulatory Visit | Attending: Vascular Surgery | Admitting: Vascular Surgery

## 2019-01-29 DIAGNOSIS — L97812 Non-pressure chronic ulcer of other part of right lower leg with fat layer exposed: Secondary | ICD-10-CM | POA: Diagnosis not present

## 2019-01-29 DIAGNOSIS — E11622 Type 2 diabetes mellitus with other skin ulcer: Secondary | ICD-10-CM | POA: Diagnosis not present

## 2019-01-29 DIAGNOSIS — B965 Pseudomonas (aeruginosa) (mallei) (pseudomallei) as the cause of diseases classified elsewhere: Secondary | ICD-10-CM | POA: Diagnosis not present

## 2019-01-29 DIAGNOSIS — Z794 Long term (current) use of insulin: Secondary | ICD-10-CM | POA: Diagnosis not present

## 2019-01-29 DIAGNOSIS — Z9981 Dependence on supplemental oxygen: Secondary | ICD-10-CM | POA: Diagnosis not present

## 2019-01-29 DIAGNOSIS — Z9181 History of falling: Secondary | ICD-10-CM | POA: Diagnosis not present

## 2019-01-29 DIAGNOSIS — F17219 Nicotine dependence, cigarettes, with unspecified nicotine-induced disorders: Secondary | ICD-10-CM | POA: Diagnosis not present

## 2019-01-29 DIAGNOSIS — J449 Chronic obstructive pulmonary disease, unspecified: Secondary | ICD-10-CM | POA: Diagnosis not present

## 2019-01-29 DIAGNOSIS — Z1159 Encounter for screening for other viral diseases: Secondary | ICD-10-CM | POA: Insufficient documentation

## 2019-01-29 DIAGNOSIS — Z85118 Personal history of other malignant neoplasm of bronchus and lung: Secondary | ICD-10-CM | POA: Diagnosis not present

## 2019-01-29 LAB — SARS CORONAVIRUS 2 (TAT 6-24 HRS): SARS Coronavirus 2: NEGATIVE

## 2019-02-01 ENCOUNTER — Other Ambulatory Visit: Payer: Self-pay

## 2019-02-01 ENCOUNTER — Encounter (HOSPITAL_COMMUNITY): Payer: Self-pay | Admitting: Vascular Surgery

## 2019-02-01 ENCOUNTER — Ambulatory Visit (HOSPITAL_COMMUNITY)
Admission: RE | Admit: 2019-02-01 | Discharge: 2019-02-01 | Disposition: A | Discharge status: Home / Self Care | Payer: Medicare HMO | Attending: Vascular Surgery | Admitting: Vascular Surgery

## 2019-02-01 ENCOUNTER — Encounter (HOSPITAL_COMMUNITY)
Admission: RE | Disposition: A | Discharge status: Home / Self Care | Payer: Self-pay | Source: Home / Self Care | Attending: Vascular Surgery

## 2019-02-01 DIAGNOSIS — Z794 Long term (current) use of insulin: Secondary | ICD-10-CM | POA: Diagnosis not present

## 2019-02-01 DIAGNOSIS — Z79899 Other long term (current) drug therapy: Secondary | ICD-10-CM | POA: Insufficient documentation

## 2019-02-01 DIAGNOSIS — Z85118 Personal history of other malignant neoplasm of bronchus and lung: Secondary | ICD-10-CM | POA: Diagnosis not present

## 2019-02-01 DIAGNOSIS — Z9181 History of falling: Secondary | ICD-10-CM | POA: Diagnosis not present

## 2019-02-01 DIAGNOSIS — J449 Chronic obstructive pulmonary disease, unspecified: Secondary | ICD-10-CM | POA: Insufficient documentation

## 2019-02-01 DIAGNOSIS — B965 Pseudomonas (aeruginosa) (mallei) (pseudomallei) as the cause of diseases classified elsewhere: Secondary | ICD-10-CM | POA: Diagnosis not present

## 2019-02-01 DIAGNOSIS — E1151 Type 2 diabetes mellitus with diabetic peripheral angiopathy without gangrene: Secondary | ICD-10-CM | POA: Insufficient documentation

## 2019-02-01 DIAGNOSIS — K219 Gastro-esophageal reflux disease without esophagitis: Secondary | ICD-10-CM | POA: Diagnosis not present

## 2019-02-01 DIAGNOSIS — E785 Hyperlipidemia, unspecified: Secondary | ICD-10-CM | POA: Diagnosis not present

## 2019-02-01 DIAGNOSIS — E039 Hypothyroidism, unspecified: Secondary | ICD-10-CM | POA: Diagnosis not present

## 2019-02-01 DIAGNOSIS — Z8673 Personal history of transient ischemic attack (TIA), and cerebral infarction without residual deficits: Secondary | ICD-10-CM | POA: Insufficient documentation

## 2019-02-01 DIAGNOSIS — Z79890 Hormone replacement therapy (postmenopausal): Secondary | ICD-10-CM | POA: Diagnosis not present

## 2019-02-01 DIAGNOSIS — Z9981 Dependence on supplemental oxygen: Secondary | ICD-10-CM | POA: Diagnosis not present

## 2019-02-01 DIAGNOSIS — I714 Abdominal aortic aneurysm, without rupture: Secondary | ICD-10-CM | POA: Diagnosis not present

## 2019-02-01 DIAGNOSIS — F17219 Nicotine dependence, cigarettes, with unspecified nicotine-induced disorders: Secondary | ICD-10-CM | POA: Diagnosis not present

## 2019-02-01 DIAGNOSIS — E11622 Type 2 diabetes mellitus with other skin ulcer: Secondary | ICD-10-CM | POA: Diagnosis not present

## 2019-02-01 DIAGNOSIS — L97812 Non-pressure chronic ulcer of other part of right lower leg with fat layer exposed: Secondary | ICD-10-CM | POA: Diagnosis not present

## 2019-02-01 DIAGNOSIS — Z7902 Long term (current) use of antithrombotics/antiplatelets: Secondary | ICD-10-CM | POA: Insufficient documentation

## 2019-02-01 DIAGNOSIS — L97919 Non-pressure chronic ulcer of unspecified part of right lower leg with unspecified severity: Secondary | ICD-10-CM | POA: Diagnosis not present

## 2019-02-01 DIAGNOSIS — I70203 Unspecified atherosclerosis of native arteries of extremities, bilateral legs: Secondary | ICD-10-CM | POA: Insufficient documentation

## 2019-02-01 DIAGNOSIS — F329 Major depressive disorder, single episode, unspecified: Secondary | ICD-10-CM | POA: Diagnosis not present

## 2019-02-01 DIAGNOSIS — I70238 Atherosclerosis of native arteries of right leg with ulceration of other part of lower right leg: Secondary | ICD-10-CM | POA: Diagnosis not present

## 2019-02-01 DIAGNOSIS — S81801D Unspecified open wound, right lower leg, subsequent encounter: Secondary | ICD-10-CM

## 2019-02-01 HISTORY — PX: ABDOMINAL AORTOGRAM W/LOWER EXTREMITY: CATH118223

## 2019-02-01 HISTORY — PX: PERIPHERAL VASCULAR INTERVENTION: CATH118257

## 2019-02-01 HISTORY — PX: PERIPHERAL VASCULAR BALLOON ANGIOPLASTY: CATH118281

## 2019-02-01 LAB — POCT I-STAT, CHEM 8
BUN: 11 mg/dL (ref 8–23)
Calcium, Ion: 1.12 mmol/L — ABNORMAL LOW (ref 1.15–1.40)
Chloride: 94 mmol/L — ABNORMAL LOW (ref 98–111)
Creatinine, Ser: 0.7 mg/dL (ref 0.61–1.24)
Glucose, Bld: 148 mg/dL — ABNORMAL HIGH (ref 70–99)
HCT: 42 % (ref 39.0–52.0)
Hemoglobin: 14.3 g/dL (ref 13.0–17.0)
Potassium: 4.4 mmol/L (ref 3.5–5.1)
Sodium: 133 mmol/L — ABNORMAL LOW (ref 135–145)
TCO2: 32 mmol/L (ref 22–32)

## 2019-02-01 LAB — GLUCOSE, CAPILLARY
Glucose-Capillary: 141 mg/dL — ABNORMAL HIGH (ref 70–99)
Glucose-Capillary: 154 mg/dL — ABNORMAL HIGH (ref 70–99)

## 2019-02-01 SURGERY — ABDOMINAL AORTOGRAM W/LOWER EXTREMITY
Anesthesia: LOCAL | Laterality: Bilateral

## 2019-02-01 MED ORDER — FENTANYL CITRATE (PF) 100 MCG/2ML IJ SOLN
INTRAMUSCULAR | Status: DC | PRN
  Administered 2019-02-01 (×2): 25 ug via INTRAVENOUS
  Administered 2019-02-01: 50 ug via INTRAVENOUS
  Administered 2019-02-01: 25 ug via INTRAVENOUS

## 2019-02-01 MED ORDER — SODIUM CHLORIDE 0.9 % WEIGHT BASED INFUSION: 1.0000 mL/kg/h | INTRAVENOUS | Status: DC

## 2019-02-01 MED ORDER — FENTANYL CITRATE (PF) 100 MCG/2ML IJ SOLN
INTRAMUSCULAR | Status: AC
  Filled 2019-02-01: qty 2

## 2019-02-01 MED ORDER — SODIUM CHLORIDE 0.9 % IV SOLN
INTRAVENOUS | Status: DC
  Administered 2019-02-01: 07:00:00 via INTRAVENOUS

## 2019-02-01 MED ORDER — ACETAMINOPHEN 325 MG PO TABS: 650.0000 mg | ORAL_TABLET | ORAL | Status: DC | PRN

## 2019-02-01 MED ORDER — LIDOCAINE HCL (PF) 1 % IJ SOLN
INTRAMUSCULAR | Status: AC
  Filled 2019-02-01: qty 30

## 2019-02-01 MED ORDER — MIDAZOLAM HCL 2 MG/2ML IJ SOLN
INTRAMUSCULAR | Status: DC | PRN
  Administered 2019-02-01: 1 mg via INTRAVENOUS
  Administered 2019-02-01: 0.5 mg via INTRAVENOUS

## 2019-02-01 MED ORDER — HEPARIN SODIUM (PORCINE) 1000 UNIT/ML IJ SOLN
INTRAMUSCULAR | Status: AC
  Filled 2019-02-01: qty 1

## 2019-02-01 MED ORDER — HEPARIN (PORCINE) IN NACL 1000-0.9 UT/500ML-% IV SOLN
INTRAVENOUS | Status: DC | PRN
  Administered 2019-02-01: 500 mL

## 2019-02-01 MED ORDER — HYDRALAZINE HCL 20 MG/ML IJ SOLN: 5.0000 mg | INTRAMUSCULAR | Status: DC | PRN

## 2019-02-01 MED ORDER — SODIUM CHLORIDE 0.9% FLUSH: 3.0000 mL | Freq: Two times a day (BID) | INTRAVENOUS | Status: DC

## 2019-02-01 MED ORDER — MORPHINE SULFATE 15 MG PO TABS
30.0000 mg | ORAL_TABLET | ORAL | Status: DC | PRN
  Administered 2019-02-01: 30 mg via ORAL
  Filled 2019-02-01 (×2): qty 2

## 2019-02-01 MED ORDER — LIDOCAINE HCL (PF) 1 % IJ SOLN
INTRAMUSCULAR | Status: DC | PRN
  Administered 2019-02-01: 30 mL

## 2019-02-01 MED ORDER — HEPARIN (PORCINE) IN NACL 1000-0.9 UT/500ML-% IV SOLN
INTRAVENOUS | Status: AC
  Filled 2019-02-01: qty 1000

## 2019-02-01 MED ORDER — SODIUM CHLORIDE 0.9% FLUSH: 3.0000 mL | INTRAVENOUS | Status: DC | PRN

## 2019-02-01 MED ORDER — MIDAZOLAM HCL 2 MG/2ML IJ SOLN
INTRAMUSCULAR | Status: AC
  Filled 2019-02-01: qty 2

## 2019-02-01 MED ORDER — LABETALOL HCL 5 MG/ML IV SOLN: 10.0000 mg | INTRAVENOUS | Status: DC | PRN

## 2019-02-01 MED ORDER — ONDANSETRON HCL 4 MG/2ML IJ SOLN: 4.0000 mg | Freq: Four times a day (QID) | INTRAMUSCULAR | Status: DC | PRN

## 2019-02-01 MED ORDER — HEPARIN SODIUM (PORCINE) 1000 UNIT/ML IJ SOLN
INTRAMUSCULAR | Status: DC | PRN
  Administered 2019-02-01: 10000 [IU] via INTRAVENOUS

## 2019-02-01 MED ORDER — IODIXANOL 320 MG/ML IV SOLN
INTRAVENOUS | Status: DC | PRN
  Administered 2019-02-01: 135 mL via INTRAVENOUS

## 2019-02-01 MED ORDER — SODIUM CHLORIDE 0.9 % IV SOLN: 250.0000 mL | INTRAVENOUS | Status: DC | PRN

## 2019-02-01 SURGICAL SUPPLY — 22 items
BALLN STERLING OTW 3X60X150 (BALLOONS) ×3
BALLOON STERLING OTW 3X60X150 (BALLOONS) ×2 IMPLANT
CATH CXI SUPP 2.6F 150 ST (CATHETERS) ×3 IMPLANT
CATH NAVICROSS ANGLED 135CM (MICROCATHETER) ×3 IMPLANT
CATH OMNI FLUSH 5F 65CM (CATHETERS) ×6 IMPLANT
CATH TEMPO AQUA 5F 100CM (CATHETERS) ×3 IMPLANT
CLOSURE MYNX CONTROL 6F/7F (Vascular Products) ×3 IMPLANT
GLIDEWIRE ADV .035X260CM (WIRE) ×3 IMPLANT
KIT ENCORE 26 ADVANTAGE (KITS) ×3 IMPLANT
KIT MICROPUNCTURE NIT STIFF (SHEATH) ×3 IMPLANT
KIT PV (KITS) ×3 IMPLANT
PAD PRO RADIOLUCENT 2001M-C (PAD) ×3 IMPLANT
SHEATH HIGHFLEX ANSEL 6FRX55 (SHEATH) ×3 IMPLANT
SHEATH PINNACLE 5F 10CM (SHEATH) ×3 IMPLANT
SHEATH PINNACLE 6F 10CM (SHEATH) ×3 IMPLANT
SHEATH PROBE COVER 6X72 (BAG) ×3 IMPLANT
STENT EXPRESS LD 8X27X75 (Permanent Stent) ×3 IMPLANT
SYR MEDRAD MARK V 150ML (SYRINGE) ×3 IMPLANT
TRANSDUCER W/STOPCOCK (MISCELLANEOUS) ×3 IMPLANT
TRAY PV CATH (CUSTOM PROCEDURE TRAY) ×3 IMPLANT
WIRE BENTSON .035X145CM (WIRE) ×3 IMPLANT
WIRE G V18X300CM (WIRE) ×3 IMPLANT

## 2019-02-01 NOTE — H&P (Signed)
   History and Physical Update  The patient was interviewed and re-examined.  The patient's previous History and Physical has been reviewed and is unchanged from recent office visit. Plan for aortogram with right lower extremity intervention today.   Brandon C. [name], MD [facility] [place] [place] [facility] Office: [phone] Pager: [phone]   02/01/2019, 7:20 AM

## 2019-02-01 NOTE — Op Note (Signed)
    Patient name: Brad Taylor MRN: [id number] DOB: 23-Aug-1950 Sex: male  02/01/2019 Pre-operative Diagnosis: Critical right lower extremity ischemia with wound Post-operative diagnosis:  Same Surgeon:  [name], MD Procedure Performed: 1.  Ultrasound-guided cannulation left common femoral artery 2.  Aortogram with bilateral lower extremity runoff 3.  3 mm plain balloon angioplasty right tibioperoneal trunk 4.  Stent of right external iliac artery with 8 x 15mm express 5.  Mynx device closure of left common femoral artery 6.  Moderate sedation with fentanyl and Versed for 57 minutes  Indications: 68 year old male with history of right lower extremity intervention for wound.  He also has had greater and small saphenous vein ablation now has recurrent wound and is indicated for angiography possible treatment of the right lower extremity.  Findings: He has a heavily calcified aorta but does have a left common femoral pulse that is palpable.  There was a week femoral pulse on the right.  There is an external iliac artery lesion that is approximately 80% and after stenting is resolved to 0% stenosis and a 2+ pulse distally.  There is a 30% stenosis in the right SFA.  In the right tibioperoneal trunk there is a subtotally occlusive lesion probably 90% stenosis and after balloon angioplasty is down to less than 20% residual stenosis.  Dominant runoff is via the PT and peroneal arteries.  Dominant runoff on the left is via posterior tibial artery.   Procedure:  The patient was identified in the holding area and taken to room 8.  The patient was then placed supine on the table and prepped and draped in the usual sterile fashion.  A time out was called.  Ultrasound was used to evaluate the left common femoral artery the area was anesthetized 1% lidocaine cannulated micropuncture needle followed by wire sheath.  Images saved the permanent record.  A Bentson wire was placed followed by 5 [place]  sheath.  Patient was administered fentanyl and Versed for total 57 minutes and his vitals were monitored throughout this time.  Aortogram was performed followed by bilateral lower extremity runoff.  We crossed the bait bifurcation using Omni catheter and Glidewire advantage.  I was able to get a Ferd Hibbs cross catheter to the mid thigh where I performed further angiography.  I then placed a long 6 French sheath into the external cardiac could not pass this any further.  Patient was quite noncompliant with the procedure throughout.  He was fully heparinized.  I used a V 18 catheter to cross the PT and confirmed intraluminal access.  This was ballooned with 3 mm balloon at nominal pressure for 2 minutes.  Completion demonstrated minimal residual stenosis and no dissection.  We then turned our attention to the external neck artery.  This was primarily stented with 8 mm balloon expandable stent.  Completion demonstrated no residual stenosis.  We exchanged for short 6 French sheath and deployed a minx device.  He tolerated procedure without immediate complication.  Contrast: 130 cc    C. [name], MD [facility] [place] Office: [phone] Pager: [phone]

## 2019-02-01 NOTE — Discharge Instructions (Signed)

## 2019-02-03 ENCOUNTER — Encounter: Payer: Self-pay | Admitting: Family

## 2019-02-03 ENCOUNTER — Ambulatory Visit (INDEPENDENT_AMBULATORY_CARE_PROVIDER_SITE_OTHER): Payer: Self-pay | Admitting: Family

## 2019-02-03 ENCOUNTER — Other Ambulatory Visit: Payer: Self-pay

## 2019-02-03 VITALS — BP 127/78 | HR 70 | Temp 96.9°F | Resp 18 | Ht 72.0 in | Wt 235.0 lb

## 2019-02-03 DIAGNOSIS — L97812 Non-pressure chronic ulcer of other part of right lower leg with fat layer exposed: Secondary | ICD-10-CM | POA: Diagnosis not present

## 2019-02-03 DIAGNOSIS — E11622 Type 2 diabetes mellitus with other skin ulcer: Secondary | ICD-10-CM | POA: Diagnosis not present

## 2019-02-03 DIAGNOSIS — Z9981 Dependence on supplemental oxygen: Secondary | ICD-10-CM | POA: Diagnosis not present

## 2019-02-03 DIAGNOSIS — J449 Chronic obstructive pulmonary disease, unspecified: Secondary | ICD-10-CM | POA: Diagnosis not present

## 2019-02-03 DIAGNOSIS — I779 Disorder of arteries and arterioles, unspecified: Secondary | ICD-10-CM

## 2019-02-03 DIAGNOSIS — Z85118 Personal history of other malignant neoplasm of bronchus and lung: Secondary | ICD-10-CM | POA: Diagnosis not present

## 2019-02-03 DIAGNOSIS — Z9181 History of falling: Secondary | ICD-10-CM | POA: Diagnosis not present

## 2019-02-03 DIAGNOSIS — F17219 Nicotine dependence, cigarettes, with unspecified nicotine-induced disorders: Secondary | ICD-10-CM | POA: Diagnosis not present

## 2019-02-03 DIAGNOSIS — Z87891 Personal history of nicotine dependence: Secondary | ICD-10-CM

## 2019-02-03 DIAGNOSIS — Z794 Long term (current) use of insulin: Secondary | ICD-10-CM | POA: Diagnosis not present

## 2019-02-03 DIAGNOSIS — B965 Pseudomonas (aeruginosa) (mallei) (pseudomallei) as the cause of diseases classified elsewhere: Secondary | ICD-10-CM | POA: Diagnosis not present

## 2019-02-03 NOTE — Patient Instructions (Addendum)
Peripheral Vascular Disease ° °Peripheral vascular disease (PVD) is a disease of the blood vessels that are not part of your heart and brain. A simple term for PVD is poor circulation. In most cases, PVD narrows the blood vessels that carry blood from your heart to the rest of your body. This can reduce the supply of blood to your arms, legs, and internal organs, like your stomach or kidneys. However, PVD most often affects a person’s lower legs and feet. Without treatment, PVD tends to get worse. °PVD can also lead to acute ischemic limb. This is when an arm or leg suddenly cannot get enough blood. This is a medical emergency. °Follow these instructions at home: °Lifestyle °· Do not use any products that contain nicotine or tobacco, such as cigarettes and e-cigarettes. If you need help quitting, ask your doctor. °· Lose weight if you are overweight. Or, stay at a healthy weight as told by your doctor. °· Eat a diet that is low in fat and cholesterol. If you need help, ask your doctor. °· Exercise regularly. Ask your doctor for activities that are right for you. °General instructions °· Take over-the-counter and prescription medicines only as told by your doctor. °· Take good care of your feet: °? Wear comfortable shoes that fit well. °? Check your feet often for any cuts or sores. °· Keep all follow-up visits as told by your doctor This is important. °Contact a doctor if: °· You have cramps in your legs when you walk. °· You have leg pain when you are at rest. °· You have coldness in a leg or foot. °· Your skin changes. °· You are unable to get or have an erection (erectile dysfunction). °· You have cuts or sores on your feet that do not heal. °Get help right away if: °· Your arm or leg turns cold, numb, and blue. °· Your arms or legs become red, warm, swollen, painful, or numb. °· You have chest pain. °· You have trouble breathing. °· You suddenly have weakness in your face, arm, or leg. °· You become very  confused or you cannot speak. °· You suddenly have a very bad headache. °· You suddenly cannot see. °Summary °· Peripheral vascular disease (PVD) is a disease of the blood vessels. °· A simple term for PVD is poor circulation. Without treatment, PVD tends to get worse. °· Treatment may include exercise, low fat and low cholesterol diet, and quitting smoking. °This information is not intended to replace advice given to you by your health care provider. Make sure you discuss any questions you have with your health care provider. °Document Released: 10/16/2009 Document Revised: 07/04/2017 Document Reviewed: 08/29/2016 °Elsevier Patient Education © 2020 Elsevier Inc[employer] ° ° ° ° °Chronic Venous Insufficiency °Chronic venous insufficiency is a condition where the leg veins cannot effectively pump blood from the legs to the heart. This happens when the vein walls are either stretched, weakened, or damaged, or when the valves inside the vein are damaged. With the right treatment, you should be able to continue with an active life. This condition is also called venous stasis. °What are the causes? °Common causes of this condition include: °· High blood pressure inside the veins (venous hypertension). °· Sitting or standing too long, causing increased blood pressure in the leg veins. °· A blood clot that blocks blood flow in a vein (deep vein thrombosis, DVT). °· Inflammation of a vein (phlebitis) that causes a blood clot to form. °· Tumors in the pelvis   that cause blood to back up. °What increases the risk? °The following factors may make you more likely to develop this condition: °· Having a family history of this condition. °· Obesity. °· Pregnancy. °· Living without enough regular physical activity or exercise (sedentary lifestyle). °· Smoking. °· Having a job that requires long periods of standing or sitting in one place. °· Being a certain age. Women in their 40s and 50s and men in their 70s are more likely to develop this  condition. °What are the signs or symptoms? °Symptoms of this condition include: °· Veins that are enlarged, bulging, or twisted (varicose veins). °· Skin breakdown or ulcers. °· Reddened skin or dark discoloration of skin on the leg between the knee and ankle. °· Brown, smooth, tight, and painful skin just above the ankle, usually on the inside of the leg (lipodermatosclerosis). °· Swelling of the legs. °How is this diagnosed? °This condition may be diagnosed based on: °· Your medical history. °· A physical exam. °· Tests, such as: °? A procedure that creates an image of a blood vessel and nearby organs and provides information about blood flow through the blood vessel (duplex ultrasound). °? A procedure that tests blood flow (plethysmography). °? A procedure that looks at the veins using X-ray and dye (venogram). °How is this treated? °The goals of treatment are to help you return to an active life and to minimize pain or disability. Treatment depends on the severity of your condition, and it may include: °· Wearing compression stockings. These can help relieve symptoms and help prevent your condition from getting worse. However, they do not cure the condition. °· Sclerotherapy. This procedure involves an injection of a solution that shrinks damaged veins. °· Surgery. This may involve: °? Removing a diseased vein (vein stripping). °? Cutting off blood flow through the vein (laser ablation surgery). °? Repairing or reconstructing a valve within the affected vein. °Follow these instructions at home: ° °  ° °· Wear compression stockings as told by your health care provider. These stockings help to prevent blood clots and reduce swelling in your legs. °· Take over-the-counter and prescription medicines only as told by your health care provider. °· Stay active by exercising, walking, or doing different activities. Ask your health care provider what activities are safe for you and how much exercise you need. °· Drink  enough fluid to keep your urine pale yellow. °· Do not use any products that contain nicotine or tobacco, such as cigarettes, e-cigarettes, and chewing tobacco. If you need help quitting, ask your health care provider. °· Keep all follow-up visits as told by your health care provider. This is important. °Contact a health care provider if you: °· Have redness, swelling, or more pain in the affected area. °· See a red streak or line that goes up or down from the affected area. °· Have skin breakdown or skin loss in the affected area, even if the breakdown is small. °· Get an injury in the affected area. °Get help right away if: °· You get an injury and an open wound in the affected area. °· You have: °? Severe pain that does not get better with medicine. °? Sudden numbness or weakness in the foot or ankle below the affected area. °? Trouble moving your foot or ankle. °? A fever. °? Worse or persistent symptoms. °? Chest pain. °? Shortness of breath. °Summary °· Chronic venous insufficiency is a condition where the leg veins cannot effectively pump blood from the   legs to the heart. °· Chronic venous insufficiency occurs when the vein walls become stretched, weakened, or damaged, or when valves within the vein are damaged. °· Treatment depends on how severe your condition is. It often involves wearing compression stockings and may involve having a procedure. °· Make sure you stay active by exercising, walking, or doing different activities. Ask your health care provider what activities are safe for you and how much exercise you need. °This information is not intended to replace advice given to you by your health care provider. Make sure you discuss any questions you have with your health care provider. °Document Released: 11/25/2006 Document Revised: 04/14/2018 Document Reviewed: 04/14/2018 °Elsevier Patient Education © 2020 Elsevier Inc. ° °

## 2019-02-03 NOTE — Progress Notes (Signed)
[facility] [place]   CC: Right lower leg wound check, history of peripheral artery occlusive disease  History of Present Illness Brad Taylor is a 68 y.o. male who is s/p balloon angioplasty of right tibioperoneal trunk and stent placement of right external iliac artery with 8 x 30mm express on 02-01-19 by Dr. [name] for critical right lower extremity ischemia with wound.   He has a history of right lower extremity intervention for wound.  He also has had greater and small saphenous vein ablation.   He returns today at the request of [facility] nurse who reports black streaks around the wound.  No notes in his record re this.   He is managed at [facility] wound care center for his right lower leg wound, HH CHANGES DRESSING 3X/WEEK; wife states that a blue foam is used on his wound. Wife states pt c/o pain at right leg wound, but is less than yesterday.  He denies fever or chills.   0.78 creatinine on 10-28-18.   He had a stroke in 2016, has right side weakness from this, some trouble walking; some loss of dexterity in his right hand, is right hand dominant.  He has significant hearing loss.   He quit smoking in February 2020, smoked about 40 years.  A1C was 8.3 most recently per wife.   Pt and wife live in [place].  Pt meds include: Statin :Yes Betablocker: No ASA: No Other anticoagulants/antiplatelets: Plavix   Past Medical History:  Diagnosis Date  . AAA (abdominal aortic aneurysm) ([place]) 5/15   3.5x3.5 cm  . COPD (chronic obstructive pulmonary disease) ([place])   . Depression   . Diabetes mellitus without complication ([place])   . GERD (gastroesophageal reflux disease)   . Hyperlipidemia   . Hypothyroidism   . Post traumatic stress disorder   . PVD (peripheral vascular disease) ([place])   . Stroke ([place])   . Thoracic aortic aneurysm ([place])   . Thyroid disease   . Ulcer     Social History Social History   Tobacco Use  . Smoking status: Current Every Day  Smoker    Packs/day: 1.00    Years: 30.00    Pack years: 30.00    Types: Cigarettes  . Smokeless tobacco: Former Systems developer    Quit date: 05/04/2013  . Tobacco comment: Less than 1/2 pk per day  Substance Use Topics  . Alcohol use: No    Comment: Former heavy drinker, been sober for 10 years  . Drug use: No    Family History Family History  Problem Relation Age of Onset  . Emphysema Father   . Thyroid disease Sister   . Stroke Maternal Grandmother   . Thyroid disease Sister   . Thyroid disease Sister     Past Surgical History:  Procedure Laterality Date  . ABDOMINAL AORTOGRAM W/LOWER EXTREMITY N/A 08/20/2017   Procedure: ABDOMINAL AORTOGRAM W/LOWER EXTREMITY;  Surgeon: Waynetta Sandy, MD;  Location: [facility];  Service: Cardiovascular;  Laterality: N/A;  . ABDOMINAL AORTOGRAM W/LOWER EXTREMITY Bilateral 02/01/2019   Procedure: ABDOMINAL AORTOGRAM W/LOWER EXTREMITY;  Surgeon: Waynetta Sandy, MD;  Location: [facility];  Service: Cardiovascular;  Laterality: Bilateral;  . CATARACT EXTRACTION    . CHOLECYSTECTOMY    . ENDOVENOUS ABLATION SAPHENOUS VEIN W/ LASER Right 03/18/2018   endovenous laser ablation R GSV by Ruta Hinds MD   . ENDOVENOUS ABLATION SAPHENOUS VEIN W/ LASER Right 04/08/2018   endovenous laser ablation R SSV  by Ruta Hinds MD   . KNEE SURGERY    . LUNG REMOVAL, PARTIAL     right, 2 years ago at [facility] for [facility]  . PERIPHERAL VASCULAR ATHERECTOMY Right 08/20/2017   Procedure: PERIPHERAL VASCULAR ATHERECTOMY;  Surgeon: Waynetta Sandy, MD;  Location: [facility];  Service: Cardiovascular;  Laterality: Right;  posterior tibial and tibeoperoneal trunk  . PERIPHERAL VASCULAR BALLOON ANGIOPLASTY Right 08/20/2017   Procedure: PERIPHERAL VASCULAR BALLOON ANGIOPLASTY;  Surgeon: Waynetta Sandy, MD;  Location: [place];  Service: Cardiovascular;  Laterality: Right;  Anterior tibial  . PERIPHERAL  VASCULAR BALLOON ANGIOPLASTY  02/01/2019   Procedure: PERIPHERAL VASCULAR BALLOON ANGIOPLASTY;  Surgeon: Waynetta Sandy, MD;  Location: [facility];  Service: Cardiovascular;;  . PERIPHERAL VASCULAR INTERVENTION  02/01/2019   Procedure: PERIPHERAL VASCULAR INTERVENTION;  Surgeon: Waynetta Sandy, MD;  Location: [facility];  Service: Cardiovascular;;  . VIDEO BRONCHOSCOPY Bilateral 12/30/2017   Procedure: VIDEO BRONCHOSCOPY WITHOUT FLUORO;  Surgeon: Marshell Garfinkel, MD;  Location: [facility];  Service: Cardiopulmonary;  Laterality: Bilateral;    No Known Allergies  Current Outpatient Medications  Medication Sig Dispense Refill  . albuterol (PROVENTIL HFA;VENTOLIN HFA) 108 (90 Base) MCG/ACT inhaler INHALE 2 PUFFS INTO THE LUNGS EVERY 6 HOURS AS NEEDED FOR WHEEZE OR SHORTNESS OF BREATH 18 g 3  . amphetamine-dextroamphetamine (ADDERALL XR) 15 MG 24 hr capsule Take 1 capsule by mouth every morning. 30 capsule 0  . busPIRone (BUSPAR) 10 MG tablet Take 0.5 tablets (5 mg total) by mouth 2 (two) times daily as needed (for anxiety). (Patient taking differently: Take 10 mg by mouth daily. ) 60 tablet 0  . cloNIDine (CATAPRES - DOSED IN MG/24 HR) 0.1 mg/24hr patch Place 1 patch (0.1 mg total) onto the skin once a week. 4 patch 12  . clopidogrel (PLAVIX) 75 MG tablet Take 1 tablet (75 mg total) by mouth daily with breakfast. 90 tablet 3  . doxycycline (VIBRAMYCIN) 100 MG capsule Take 1 capsule (100 mg total) by mouth 2 (two) times daily. 20 capsule 0  . fluticasone (FLONASE) 50 MCG/ACT nasal spray Place 2 sprays into both nostrils daily. (Patient taking differently: Place 2 sprays into both nostrils daily as needed for allergies. ) 16 g 6  . Fluticasone-Umeclidin-Vilant (TRELEGY ELLIPTA) 100-62.5-25 MCG/INH AEPB Inhale 1 Inhaler into the lungs daily. 1 each 11  . gabapentin (NEURONTIN) 300 MG capsule Take 1 capsule (300 mg total) by mouth 3 (three) times daily. 90 capsule 3   . insulin glargine (LANTUS) 100 UNIT/ML injection Inject 30-40 Units into the skin 2 (two) times daily. Take 40 units in the morning and 30 units at night    . levothyroxine (SYNTHROID, LEVOTHROID) 125 MCG tablet Take 1 tablet (125 mcg total) by mouth daily before breakfast. 30 tablet 1  . meclizine (ANTIVERT) 25 MG tablet Take 1 tablet (25 mg total) by mouth 3 (three) times daily as needed for dizziness (vertigo). 30 tablet 0  . metFORMIN (GLUCOPHAGE) 500 MG tablet Take 500 mg by mouth 2 (two) times daily with a meal.    . metroNIDAZOLE (FLAGYL) 500 MG tablet Take 1 tablet (500 mg total) by mouth 2 (two) times daily. 20 tablet 0  . Morphine Sulfate ER 30 MG T12A Take 30 mg by mouth every 12 (twelve) hours as needed for pain.     Marland Kitchen MOVANTIK 25 MG TABS tablet TAKE 1 TABLET BY MOUTH EVERY DAY (Patient taking differently: Take 25  mg by mouth daily. ) 30 tablet 5  . RABEprazole (ACIPHEX) 20 MG tablet Take 2 tablets (40 mg total) by mouth daily before breakfast. 30 tablet 1  . rosuvastatin (CRESTOR) 10 MG tablet Take 5 mg by mouth daily.    . tamsulosin (FLOMAX) 0.4 MG CAPS capsule Take 0.4 mg by mouth at bedtime.     . Tiotropium Bromide-Olodaterol (STIOLTO RESPIMAT) 2.5-2.5 MCG/ACT AERS Inhale 2 Inhalers into the lungs daily. 12 g 3  . traZODone (DESYREL) 100 MG tablet Take 3 tablets (300 mg total) by mouth at bedtime. (Patient taking differently: Take 200-300 mg by mouth at bedtime. ) 30 tablet 0  . triamcinolone cream (KENALOG) 0.1 % Apply 1 application topically 2 (two) times daily as needed (rash).    . venlafaxine XR (EFFEXOR-XR) 150 MG 24 hr capsule Take 1 capsule (150 mg total) by mouth daily with breakfast. (Patient taking differently: Take 150 mg by mouth 2 (two) times daily. ) 30 capsule 1   No current facility-administered medications for this visit.     ROS: See HPI for pertinent positives and negatives.   Physical Examination  Vitals:   02/03/19 1418  BP: 127/78  Pulse: 70   Resp: 18  Temp: (!) 96.9 F (36.1 C)  TempSrc: Temporal  SpO2: 94%  Weight: 235 lb (106.6 kg)  Height: 6' (1.829 m)   Body mass index is 31.87 kg/m.  General: A&O x 3, WDWN, obese male. Gait: seated in w/c HEENT: No gross abnormalities.  Pulmonary: Respirations are non labored Cardiac: irregular rhythm with controlled rate  Radial pulses are 2+ palpable bilaterally   Adominal aortic pulse is not palpable                         VASCULAR EXAM: Extremities without ischemic changes, without Gangrene; with open wounds at lower leg, lateral aspect, see photo below.  Right lower leg, lateral and anterior aspect                                                                                                           LE Pulses Right Left       FEMORAL  not palpable seated in w/c  not palpable        POPLITEAL  not palpable   not palpable       POSTERIOR TIBIAL  not palpable, monophasic Doppler signal   2+ palpable        DORSALIS PEDIS      ANTERIOR TIBIAL not palpable, brisk monophasic Doppler signal  not palpable    Abdomen: soft, NT, no palpable masses. Skin: See Extremities  Musculoskeletal: no muscle wasting or atrophy.  Neurologic: A&O X 3; appropriate affect, Sensation is normal; MOTOR FUNCTION:  moving all extremities equally, motor strength 5/5 throughout except 4/5 right LE. Speech is fluent/normal. CN 2-12 intact except has significant hearing loss. Psychiatric: Thought content is normal, mood appropriate for clinical situation.     ASSESSMENT: Brad Taylor is a 68 y.o. male who is  s/p balloon angioplasty of right tibioperoneal trunk and stent placement of right external iliac artery with 8 x 46mm express on 02-01-19 by Dr. [name] for critical right lower extremity ischemia with wound.   He has a history of right lower extremity intervention for wound.  He also has had greater and small saphenous vein ablation.   He returns today at the request of [facility] nurse who  reports black streaks around the wound.    Pt has Dopperlable DP and PT pulses in the right foot. Dr. Scot Dock spoke with pt and wife, and examined pt. The black at the edges of the wound may be from the topical agent applied to wound, a "blue foam" according to wife.  There appears to be some dry eschar in the wound, which hopefully will be debrided with hydrogel dressing changes, when the dry dressing is removed.   He also seems to have a significant component of venous insufficiency, which this current large wound is partially attributed.   He has no fever or chills. His wound care is managed by [facility] wound clinic.   He has an appointment on 03-12-19 for arterial testing and see NP or PA; this is a day that Dr. [name] is in the office.   DATA None today   PLAN:  He has an appointment on 03-12-19 for arterial testing and see NP or PA; this is a day that Dr. [name] is in the office.  The patient was given information about PAD including signs, symptoms, treatment, what symptoms should prompt the patient to seek immediate medical care, and risk reduction measures to take.  Clemon Chambers, RN, MSN, FNP-C [facility] Phone: [phone]  Clinic MD: Scot Dock  02/03/19 2:31 PM

## 2019-02-05 ENCOUNTER — Other Ambulatory Visit: Payer: Self-pay

## 2019-02-05 ENCOUNTER — Encounter (HOSPITAL_COMMUNITY): Payer: Self-pay

## 2019-02-05 ENCOUNTER — Emergency Department (HOSPITAL_COMMUNITY): Payer: Medicare HMO

## 2019-02-05 ENCOUNTER — Observation Stay (HOSPITAL_COMMUNITY)
Admission: EM | Admit: 2019-02-05 | Discharge: 2019-02-07 | Disposition: A | Discharge status: Home Health | Payer: Medicare HMO | Attending: Internal Medicine | Admitting: Internal Medicine

## 2019-02-05 DIAGNOSIS — Z1159 Encounter for screening for other viral diseases: Secondary | ICD-10-CM | POA: Insufficient documentation

## 2019-02-05 DIAGNOSIS — A09 Infectious gastroenteritis and colitis, unspecified: Secondary | ICD-10-CM | POA: Insufficient documentation

## 2019-02-05 DIAGNOSIS — L97812 Non-pressure chronic ulcer of other part of right lower leg with fat layer exposed: Secondary | ICD-10-CM | POA: Diagnosis not present

## 2019-02-05 DIAGNOSIS — R197 Diarrhea, unspecified: Secondary | ICD-10-CM | POA: Diagnosis not present

## 2019-02-05 DIAGNOSIS — Z7951 Long term (current) use of inhaled steroids: Secondary | ICD-10-CM | POA: Insufficient documentation

## 2019-02-05 DIAGNOSIS — Z794 Long term (current) use of insulin: Secondary | ICD-10-CM | POA: Insufficient documentation

## 2019-02-05 DIAGNOSIS — E871 Hypo-osmolality and hyponatremia: Secondary | ICD-10-CM | POA: Insufficient documentation

## 2019-02-05 DIAGNOSIS — Z79899 Other long term (current) drug therapy: Secondary | ICD-10-CM | POA: Diagnosis not present

## 2019-02-05 DIAGNOSIS — K219 Gastro-esophageal reflux disease without esophagitis: Secondary | ICD-10-CM | POA: Diagnosis not present

## 2019-02-05 DIAGNOSIS — E039 Hypothyroidism, unspecified: Secondary | ICD-10-CM | POA: Insufficient documentation

## 2019-02-05 DIAGNOSIS — E876 Hypokalemia: Secondary | ICD-10-CM | POA: Insufficient documentation

## 2019-02-05 DIAGNOSIS — I69351 Hemiplegia and hemiparesis following cerebral infarction affecting right dominant side: Secondary | ICD-10-CM | POA: Insufficient documentation

## 2019-02-05 DIAGNOSIS — B965 Pseudomonas (aeruginosa) (mallei) (pseudomallei) as the cause of diseases classified elsewhere: Secondary | ICD-10-CM | POA: Diagnosis not present

## 2019-02-05 DIAGNOSIS — I441 Atrioventricular block, second degree: Secondary | ICD-10-CM | POA: Diagnosis not present

## 2019-02-05 DIAGNOSIS — E785 Hyperlipidemia, unspecified: Secondary | ICD-10-CM | POA: Insufficient documentation

## 2019-02-05 DIAGNOSIS — J9601 Acute respiratory failure with hypoxia: Secondary | ICD-10-CM

## 2019-02-05 DIAGNOSIS — R0902 Hypoxemia: Secondary | ICD-10-CM | POA: Diagnosis not present

## 2019-02-05 DIAGNOSIS — I712 Thoracic aortic aneurysm, without rupture: Secondary | ICD-10-CM | POA: Insufficient documentation

## 2019-02-05 DIAGNOSIS — Z209 Contact with and (suspected) exposure to unspecified communicable disease: Secondary | ICD-10-CM | POA: Diagnosis not present

## 2019-02-05 DIAGNOSIS — Z8673 Personal history of transient ischemic attack (TIA), and cerebral infarction without residual deficits: Secondary | ICD-10-CM

## 2019-02-05 DIAGNOSIS — Z902 Acquired absence of lung [part of]: Secondary | ICD-10-CM | POA: Insufficient documentation

## 2019-02-05 DIAGNOSIS — I5032 Chronic diastolic (congestive) heart failure: Secondary | ICD-10-CM | POA: Insufficient documentation

## 2019-02-05 DIAGNOSIS — E1151 Type 2 diabetes mellitus with diabetic peripheral angiopathy without gangrene: Secondary | ICD-10-CM | POA: Diagnosis not present

## 2019-02-05 DIAGNOSIS — Z79890 Hormone replacement therapy (postmenopausal): Secondary | ICD-10-CM | POA: Insufficient documentation

## 2019-02-05 DIAGNOSIS — I714 Abdominal aortic aneurysm, without rupture: Secondary | ICD-10-CM | POA: Diagnosis not present

## 2019-02-05 DIAGNOSIS — J449 Chronic obstructive pulmonary disease, unspecified: Secondary | ICD-10-CM | POA: Diagnosis not present

## 2019-02-05 DIAGNOSIS — F17210 Nicotine dependence, cigarettes, uncomplicated: Secondary | ICD-10-CM | POA: Insufficient documentation

## 2019-02-05 DIAGNOSIS — Z9981 Dependence on supplemental oxygen: Secondary | ICD-10-CM | POA: Diagnosis not present

## 2019-02-05 DIAGNOSIS — E11622 Type 2 diabetes mellitus with other skin ulcer: Secondary | ICD-10-CM | POA: Diagnosis not present

## 2019-02-05 DIAGNOSIS — F17219 Nicotine dependence, cigarettes, with unspecified nicotine-induced disorders: Secondary | ICD-10-CM | POA: Diagnosis not present

## 2019-02-05 DIAGNOSIS — Z7902 Long term (current) use of antithrombotics/antiplatelets: Secondary | ICD-10-CM | POA: Insufficient documentation

## 2019-02-05 DIAGNOSIS — IMO0002 Reserved for concepts with insufficient information to code with codable children: Secondary | ICD-10-CM

## 2019-02-05 DIAGNOSIS — I69959 Hemiplegia and hemiparesis following unspecified cerebrovascular disease affecting unspecified side: Secondary | ICD-10-CM

## 2019-02-05 DIAGNOSIS — I723 Aneurysm of iliac artery: Secondary | ICD-10-CM | POA: Insufficient documentation

## 2019-02-05 DIAGNOSIS — F329 Major depressive disorder, single episode, unspecified: Secondary | ICD-10-CM | POA: Insufficient documentation

## 2019-02-05 DIAGNOSIS — E1140 Type 2 diabetes mellitus with diabetic neuropathy, unspecified: Secondary | ICD-10-CM

## 2019-02-05 DIAGNOSIS — Q438 Other specified congenital malformations of intestine: Secondary | ICD-10-CM | POA: Insufficient documentation

## 2019-02-05 DIAGNOSIS — E860 Dehydration: Secondary | ICD-10-CM | POA: Diagnosis not present

## 2019-02-05 DIAGNOSIS — R1030 Lower abdominal pain, unspecified: Secondary | ICD-10-CM | POA: Diagnosis not present

## 2019-02-05 DIAGNOSIS — R531 Weakness: Secondary | ICD-10-CM | POA: Diagnosis not present

## 2019-02-05 DIAGNOSIS — Z85118 Personal history of other malignant neoplasm of bronchus and lung: Secondary | ICD-10-CM | POA: Insufficient documentation

## 2019-02-05 DIAGNOSIS — R52 Pain, unspecified: Secondary | ICD-10-CM | POA: Diagnosis not present

## 2019-02-05 DIAGNOSIS — Z955 Presence of coronary angioplasty implant and graft: Secondary | ICD-10-CM | POA: Insufficient documentation

## 2019-02-05 DIAGNOSIS — I4891 Unspecified atrial fibrillation: Secondary | ICD-10-CM | POA: Insufficient documentation

## 2019-02-05 DIAGNOSIS — I739 Peripheral vascular disease, unspecified: Secondary | ICD-10-CM | POA: Diagnosis present

## 2019-02-05 DIAGNOSIS — Z9181 History of falling: Secondary | ICD-10-CM | POA: Diagnosis not present

## 2019-02-05 DIAGNOSIS — Z03818 Encounter for observation for suspected exposure to other biological agents ruled out: Secondary | ICD-10-CM | POA: Diagnosis not present

## 2019-02-05 DIAGNOSIS — N281 Cyst of kidney, acquired: Secondary | ICD-10-CM | POA: Insufficient documentation

## 2019-02-05 LAB — COMPREHENSIVE METABOLIC PANEL
ALT: 17 U/L (ref 0–44)
AST: 16 U/L (ref 15–41)
Albumin: 3.3 g/dL — ABNORMAL LOW (ref 3.5–5.0)
Alkaline Phosphatase: 153 U/L — ABNORMAL HIGH (ref 38–126)
Anion gap: 14 (ref 5–15)
BUN: 12 mg/dL (ref 8–23)
CO2: 23 mmol/L (ref 22–32)
Calcium: 8.8 mg/dL — ABNORMAL LOW (ref 8.9–10.3)
Chloride: 92 mmol/L — ABNORMAL LOW (ref 98–111)
Creatinine, Ser: 0.72 mg/dL (ref 0.61–1.24)
GFR calc Af Amer: >60 mL/min (ref >60)
GFR calc non Af Amer: >60 mL/min (ref >60)
Glucose, Bld: 249 mg/dL — ABNORMAL HIGH (ref 70–99)
Potassium: 3.3 mmol/L — ABNORMAL LOW (ref 3.5–5.1)
Sodium: 129 mmol/L — ABNORMAL LOW (ref 135–145)
Total Bilirubin: 0.5 mg/dL (ref 0.3–1.2)
Total Protein: 7.4 g/dL (ref 6.5–8.1)

## 2019-02-05 LAB — LACTIC ACID, PLASMA: Lactic Acid, Venous: 1.2 mmol/L (ref 0.5–1.9)

## 2019-02-05 LAB — URINALYSIS, ROUTINE W REFLEX MICROSCOPIC
Bilirubin Urine: NEGATIVE
Glucose, UA: 50 mg/dL — AB
Hgb urine dipstick: NEGATIVE
Ketones, ur: 20 mg/dL — AB
Leukocytes,Ua: NEGATIVE
Nitrite: NEGATIVE
Protein, ur: 30 mg/dL — AB
Specific Gravity, Urine: 1.025 (ref 1.005–1.030)
pH: 6 (ref 5.0–8.0)

## 2019-02-05 LAB — CBC WITH DIFFERENTIAL/PLATELET
Abs Immature Granulocytes: 0.1 10*3/uL — ABNORMAL HIGH (ref 0.00–0.07)
Basophils Absolute: 0 10*3/uL (ref 0.0–0.1)
Basophils Relative: 0 %
Eosinophils Absolute: 0.1 10*3/uL (ref 0.0–0.5)
Eosinophils Relative: 0 %
HCT: 41 % (ref 39.0–52.0)
Hemoglobin: 13.6 g/dL (ref 13.0–17.0)
Immature Granulocytes: 1 %
Lymphocytes Relative: 13 %
Lymphs Abs: 2 10*3/uL (ref 0.7–4.0)
MCH: 28.9 pg (ref 26.0–34.0)
MCHC: 33.2 g/dL (ref 30.0–36.0)
MCV: 87.2 fL (ref 80.0–100.0)
Monocytes Absolute: 1.3 10*3/uL — ABNORMAL HIGH (ref 0.1–1.0)
Monocytes Relative: 8 %
Neutro Abs: 11.9 10*3/uL — ABNORMAL HIGH (ref 1.7–7.7)
Neutrophils Relative %: 78 %
Platelets: 306 10*3/uL (ref 150–400)
RBC: 4.7 MIL/uL (ref 4.22–5.81)
RDW: 13 % (ref 11.5–15.5)
WBC: 15.4 10*3/uL — ABNORMAL HIGH (ref 4.0–10.5)
nRBC: 0 % (ref 0.0–0.2)

## 2019-02-05 LAB — SARS CORONAVIRUS 2 BY RT PCR (HOSPITAL ORDER, PERFORMED IN ~~LOC~~ HOSPITAL LAB): SARS Coronavirus 2: NEGATIVE

## 2019-02-05 MED ORDER — LIDOCAINE HCL URETHRAL/MUCOSAL 2 % EX GEL
1.0000 "application " | Freq: Once | CUTANEOUS | Status: AC
  Administered 2019-02-05: 1
  Filled 2019-02-05: qty 5

## 2019-02-05 MED ORDER — HYDROXYZINE HCL 25 MG PO TABS
25.0000 mg | ORAL_TABLET | Freq: Once | ORAL | Status: AC
  Administered 2019-02-05: 25 mg via ORAL
  Filled 2019-02-05: qty 1

## 2019-02-05 MED ORDER — IOHEXOL 300 MG/ML  SOLN
100.0000 mL | Freq: Once | INTRAMUSCULAR | Status: AC | PRN
  Administered 2019-02-05: 100 mL via INTRAVENOUS

## 2019-02-05 MED ORDER — SODIUM CHLORIDE 0.9 % IV BOLUS
1000.0000 mL | Freq: Once | INTRAVENOUS | Status: AC
  Administered 2019-02-05: 20:00:00 1000 mL via INTRAVENOUS

## 2019-02-05 MED ORDER — SODIUM CHLORIDE (PF) 0.9 % IJ SOLN
INTRAMUSCULAR | Status: AC
  Filled 2019-02-05: qty 50

## 2019-02-05 NOTE — ED Notes (Signed)
Pt is requesting the door be left open due to his claustrophobia from the [employer].

## 2019-02-05 NOTE — ED Notes (Signed)
Pt aware that urine sample is needed. Pt left with urinal. Pt stated that he will leave a sample as soon as he is able.

## 2019-02-05 NOTE — ED Notes (Signed)
Bed: WA20 Expected date:  Expected time:  Means of arrival:  Comments: EMS-weakness 

## 2019-02-05 NOTE — ED Triage Notes (Signed)
Pt had a procedure on his left groin two days ago, is now having generalized weakness and diarrhea.

## 2019-02-05 NOTE — ED Provider Notes (Signed)
[facility] DEPT Provider Note   CSN: [id number] Arrival date & time: 02/05/19  1854     History   Chief Complaint Chief Complaint  Patient presents with   Diarrhea   Groin Pain    HPI Brad Taylor is a 68 y.o. male.     Patient has had diarrhea and abdominal cramping for couple days and has been weak.  His wife states that he has not acted like his normal self either  The history is provided by the patient. No language interpreter was used.  Diarrhea Quality:  Explosive Severity:  Moderate Onset quality:  Sudden Timing:  Constant Progression:  Worsening Relieved by:  Nothing Worsened by:  Nothing Ineffective treatments:  None tried Associated symptoms: abdominal pain   Associated symptoms: no headaches   Groin Pain Associated symptoms include abdominal pain. Pertinent negatives include no chest pain and no headaches.    Past Medical History:  Diagnosis Date   AAA (abdominal aortic aneurysm) ([place]) 5/15   3.5x3.5 cm   COPD (chronic obstructive pulmonary disease) (HCC)    Depression    Diabetes mellitus without complication (HCC)    GERD (gastroesophageal reflux disease)    Hyperlipidemia    Hypothyroidism    Post traumatic stress disorder    PVD (peripheral vascular disease) ([place])    Stroke [facility])    Thoracic aortic aneurysm ([place])    Thyroid disease    Ulcer     Patient Active Problem List   Diagnosis Date Noted   Thoracic aortic aneurysm ([place])    PVD (peripheral vascular disease) ([place])    Jerking 10/01/2018   History of stroke 10/01/2018   COPD with acute bronchitis ([place]) 09/12/2018   H/O: lung cancer 09/12/2018   Myoclonic jerking 09/12/2018   Leg wound, right 09/12/2018   COPD exacerbation ([place]) 09/12/2018   Tracheal mass    Osteomyelitis of femur ([place]) 12/03/2017   Hypothyroidism 12/03/2017   Chronic ulcer of left leg ([place]) 12/03/2017   Cellulitis of left leg 12/03/2017   Chronic  diastolic CHF (congestive heart failure) ([place]) 12/03/2017   Uncontrolled type 2 diabetes mellitus with diabetic neuropathy, with long-term current use of insulin ([place]) 07/25/2015   Hemiplegia and hemiparesis following unspecified cerebrovascular disease affecting unspecified side ([place]) 07/25/2015   Cellulitis, gluteal, left 01/19/2014   Tobacco abuse 01/19/2014   Aphasia as late effect of stroke 11/09/2013   CVA (cerebral infarction) 09/20/2013   Stroke ([place]) 09/15/2013    Past Surgical History:  Procedure Laterality Date   ABDOMINAL AORTOGRAM W/LOWER EXTREMITY N/A 08/20/2017   Procedure: ABDOMINAL AORTOGRAM W/LOWER EXTREMITY;  Surgeon: Waynetta Sandy, MD;  Location: [facility];  Service: Cardiovascular;  Laterality: N/A;   ABDOMINAL AORTOGRAM W/LOWER EXTREMITY Bilateral 02/01/2019   Procedure: ABDOMINAL AORTOGRAM W/LOWER EXTREMITY;  Surgeon: Waynetta Sandy, MD;  Location: [facility];  Service: Cardiovascular;  Laterality: Bilateral;   CATARACT EXTRACTION     CHOLECYSTECTOMY     ENDOVENOUS ABLATION SAPHENOUS VEIN W/ LASER Right 03/18/2018   endovenous laser ablation R GSV by Ruta Hinds MD    ENDOVENOUS ABLATION SAPHENOUS VEIN W/ LASER Right 04/08/2018   endovenous laser ablation R SSV by Ruta Hinds MD    [place], PARTIAL     right, 2 years ago at [facility] for [facility]   PERIPHERAL VASCULAR ATHERECTOMY Right 08/20/2017   Procedure: PERIPHERAL VASCULAR ATHERECTOMY;  Surgeon: Waynetta Sandy, MD;  Location: [facility];  Service: Cardiovascular;  Laterality: Right;  posterior tibial and tibeoperoneal trunk   PERIPHERAL VASCULAR BALLOON ANGIOPLASTY Right 08/20/2017   Procedure: PERIPHERAL VASCULAR BALLOON ANGIOPLASTY;  Surgeon: Waynetta Sandy, MD;  Location: [facility];  Service: Cardiovascular;  Laterality: Right;  Anterior tibial   PERIPHERAL VASCULAR BALLOON ANGIOPLASTY  02/01/2019    Procedure: PERIPHERAL VASCULAR BALLOON ANGIOPLASTY;  Surgeon: Waynetta Sandy, MD;  Location: [facility];  Service: Cardiovascular;;   PERIPHERAL VASCULAR INTERVENTION  02/01/2019   Procedure: PERIPHERAL VASCULAR INTERVENTION;  Surgeon: Waynetta Sandy, MD;  Location: [facility];  Service: Cardiovascular;;   VIDEO BRONCHOSCOPY Bilateral 12/30/2017   Procedure: VIDEO BRONCHOSCOPY WITHOUT FLUORO;  Surgeon: Marshell Garfinkel, MD;  Location: [facility];  Service: Cardiopulmonary;  Laterality: Bilateral;        Home Medications    Prior to Admission medications   Medication Sig Start Date End Date Taking? Authorizing Provider  albuterol (PROVENTIL HFA;VENTOLIN HFA) 108 (90 Base) MCG/ACT inhaler INHALE 2 PUFFS INTO THE LUNGS EVERY 6 HOURS AS NEEDED FOR WHEEZE OR SHORTNESS OF BREATH Patient taking differently: Inhale 2 puffs into the lungs every 6 (six) hours as needed for wheezing or shortness of breath.  12/16/17  Yes [name], MD  busPIRone (BUSPAR) 10 MG tablet Take 0.5 tablets (5 mg total) by mouth 2 (two) times daily as needed (for anxiety). Patient taking differently: Take 10 mg by mouth 2 (two) times daily.  10/08/13  Yes Angiulli, Lavon Paganini, PA-C  clopidogrel (PLAVIX) 75 MG tablet Take 1 tablet (75 mg total) by mouth daily with breakfast. 02/01/14  Yes Lam, Rudi Rummage, NP  fluticasone (FLONASE) 50 MCG/ACT nasal spray Place 2 sprays into both nostrils daily. Patient taking differently: Place 2 sprays into both nostrils daily as needed for allergies.  12/08/18  Yes [name], MD  gabapentin (NEURONTIN) 300 MG capsule Take 1 capsule (300 mg total) by mouth 3 (three) times daily. 11/07/17  Yes [name], MD  insulin glargine (LANTUS) 100 UNIT/ML injection Inject 45-55 Units into the skin See admin instructions. Take 55 units in the morning and 45 units at night 10/08/13  Yes Angiulli, Lavon Paganini, PA-C  levothyroxine (SYNTHROID, LEVOTHROID) 125 MCG  tablet Take 1 tablet (125 mcg total) by mouth daily before breakfast. 10/08/13  Yes Angiulli, Lavon Paganini, PA-C  metFORMIN (GLUCOPHAGE) 500 MG tablet Take 500 mg by mouth 2 (two) times daily with a meal.   Yes [provider]  Morphine Sulfate ER 30 MG T12A Take 30 mg by mouth every 12 (twelve) hours as needed for pain.    Yes [provider]  MOVANTIK 25 MG TABS tablet TAKE 1 TABLET BY MOUTH EVERY DAY Patient taking differently: Take 25 mg by mouth daily.  04/23/18  Yes [name], MD  RABEprazole (ACIPHEX) 20 MG tablet Take 2 tablets (40 mg total) by mouth daily before breakfast. 10/08/13  Yes Angiulli, Lavon Paganini, PA-C  rosuvastatin (CRESTOR) 10 MG tablet Take 5 mg by mouth daily.   Yes [provider]  tamsulosin (FLOMAX) 0.4 MG CAPS capsule Take 0.4 mg by mouth at bedtime.    Yes [provider]  traZODone (DESYREL) 100 MG tablet Take 3 tablets (300 mg total) by mouth at bedtime. 10/08/13  Yes Angiulli, Lavon Paganini, PA-C  triamcinolone cream (KENALOG) 0.1 % Apply 1 application topically 2 (two) times daily as needed (rash).   Yes [provider]  venlafaxine XR (  EFFEXOR-XR) 150 MG 24 hr capsule Take 1 capsule (150 mg total) by mouth daily with breakfast. Patient taking differently: Take 150 mg by mouth 2 (two) times daily.  10/08/13  Yes Angiulli, Lavon Paganini, PA-C  amphetamine-dextroamphetamine (ADDERALL XR) 15 MG 24 hr capsule Take 1 capsule by mouth every morning. Patient not taking: Reported on 02/05/2019 11/09/18   [name], MD  cloNIDine (CATAPRES - DOSED IN MG/24 HR) 0.1 mg/24hr patch Place 1 patch (0.1 mg total) onto the skin once a week. Patient not taking: Reported on 02/05/2019 12/24/18   [name], MD  doxycycline (VIBRAMYCIN) 100 MG capsule Take 1 capsule (100 mg total) by mouth 2 (two) times daily. Patient not taking: Reported on 02/05/2019 01/14/19   Wurst, [place], PA-C  Fluticasone-Umeclidin-Vilant (TRELEGY ELLIPTA) 100-62.5-25 MCG/INH  AEPB Inhale 1 Inhaler into the lungs daily. Patient not taking: Reported on 02/05/2019 09/24/18   [name], MD  meclizine (ANTIVERT) 25 MG tablet Take 1 tablet (25 mg total) by mouth 3 (three) times daily as needed for dizziness (vertigo). Patient not taking: Reported on 02/05/2019 12/08/18   [name], MD  metroNIDAZOLE (FLAGYL) 500 MG tablet Take 1 tablet (500 mg total) by mouth 2 (two) times daily. Patient not taking: Reported on 02/05/2019 12/22/18   [name], MD  Tiotropium Bromide-Olodaterol (STIOLTO RESPIMAT) 2.5-2.5 MCG/ACT AERS Inhale 2 Inhalers into the lungs daily. Patient not taking: Reported on 02/05/2019 07/09/18   [name], MD    Family History Family History  Problem Relation Age of Onset   Emphysema Father    Thyroid disease Sister    Stroke Maternal Grandmother    Thyroid disease Sister    Thyroid disease Sister     Social History Social History   Tobacco Use   Smoking status: Current Every Day Smoker    Packs/day: 1.00    Years: 30.00    Pack years: 30.00    Types: Cigarettes   Smokeless tobacco: Former Systems developer    Quit date: 05/04/2013   Tobacco comment: Less than 1/2 pk per day  Substance Use Topics   Alcohol use: No    Comment: Former heavy drinker, been sober for 10 years   Drug use: No     Allergies   Patient has no known allergies.   Review of Systems Review of Systems  Constitutional: Negative for appetite change and fatigue.  HENT: Negative for congestion, ear discharge and sinus pressure.   Eyes: Negative for discharge.  Respiratory: Negative for cough.   Cardiovascular: Negative for chest pain.  Gastrointestinal: Positive for abdominal pain and diarrhea.  Genitourinary: Negative for frequency and hematuria.  Musculoskeletal: Negative for back pain.  Skin: Negative for rash.  Neurological: Negative for seizures and headaches.  Psychiatric/Behavioral: Negative for hallucinations.     Physical  Exam Updated Vital Signs BP 127/66    Pulse 77    Temp 99.1 F (37.3 C) (Oral)    Resp 17    Ht 6' (1.829 m)    Wt 107 kg    SpO2 95%    BMI 32.01 kg/m   Physical Exam Vitals signs and nursing note reviewed.  Constitutional:      Appearance: He is well-developed.  HENT:     Head: Normocephalic.     Nose: Nose normal.  Eyes:     General: No scleral icterus.    Conjunctiva/sclera: Conjunctivae normal.  Neck:     Musculoskeletal: Neck supple.     Thyroid:  No thyromegaly.  Cardiovascular:     Rate and Rhythm: Normal rate and regular rhythm.     Heart sounds: No murmur. No friction rub. No gallop.   Pulmonary:     Breath sounds: No stridor. No wheezing or rales.  Chest:     Chest wall: No tenderness.  Abdominal:     General: There is no distension.     Tenderness: There is abdominal tenderness. There is no rebound.  Musculoskeletal: Normal range of motion.  Lymphadenopathy:     Cervical: No cervical adenopathy.  Skin:    Findings: No erythema or rash.  Neurological:     Mental Status: He is oriented to person, place, and time.     Motor: No abnormal muscle tone.     Coordination: Coordination normal.  Psychiatric:        Behavior: Behavior normal.      ED Treatments / Results  Labs (all labs ordered are listed, but only abnormal results are displayed) Labs Reviewed  CBC WITH DIFFERENTIAL/PLATELET - Abnormal; Notable for the following components:      Result Value   WBC 15.4 (*)    Neutro Abs 11.9 (*)    Monocytes Absolute 1.3 (*)    Abs Immature Granulocytes 0.10 (*)    All other components within normal limits  COMPREHENSIVE METABOLIC PANEL - Abnormal; Notable for the following components:   Sodium 129 (*)    Potassium 3.3 (*)    Chloride 92 (*)    Glucose, Bld 249 (*)    Calcium 8.8 (*)    Albumin 3.3 (*)    Alkaline Phosphatase 153 (*)    All other components within normal limits  SARS CORONAVIRUS 2 (HOSPITAL ORDER, [facility])   LACTIC ACID, PLASMA  URINALYSIS, ROUTINE W REFLEX MICROSCOPIC    EKG None  Radiology Dg Abd Acute 2+v W 1v Chest  Result Date: 02/05/2019 CLINICAL DATA:  Procedure on left groin 2 days ago. Generalized weakness and diarrhea. EXAM: DG ABDOMEN ACUTE W/ 1V CHEST COMPARISON:  CT scan September 12, 2018 FINDINGS: The patient is status post right upper lobectomy. Changes in the right hilum and volume loss on the right are consistent with previous surgery. Today's x-ray is similar in appearance compared to the scout film from September 12, 2018. No pneumothorax. No change in the cardiomediastinal silhouette. No acute infiltrate. Elevation the right hemidiaphragm remains. No free air, portal venous gas, or pneumatosis. No bowel dilatation to suggest obstruction. IMPRESSION: No acute abnormalities noted. Postoperative changes in the right chest. Electronically Signed   By: Dorise Bullion III M.D   On: 02/05/2019 20:24    Procedures Procedures (including critical care time)  Medications Ordered in ED Medications  lidocaine (XYLOCAINE) 2 % jelly 1 application (has no administration in time range)  sodium chloride 0.9 % bolus 1,000 mL (1,000 mLs Intravenous New Bag/Given 02/05/19 1938)  hydrOXYzine (ATARAX/VISTARIL) tablet 25 mg (25 mg Oral Given 02/05/19 1949)     Initial Impression / Assessment and Plan / ED Course  I have reviewed the triage vital signs and the nursing notes.  Pertinent labs & imaging results that were available during my care of the patient were reviewed by me and considered in my medical decision making (see chart for details).        Patient with infectious diarrhea.  CT scan pending.  Patient will be admitted to medicine  Final Clinical Impressions(s) / ED Diagnoses   Final diagnoses:  None  ED Discharge Orders    None       Milton Ferguson, MD 02/07/19 1101

## 2019-02-06 ENCOUNTER — Encounter (HOSPITAL_COMMUNITY): Payer: Self-pay | Admitting: Internal Medicine

## 2019-02-06 ENCOUNTER — Observation Stay (HOSPITAL_BASED_OUTPATIENT_CLINIC_OR_DEPARTMENT_OTHER): Payer: Medicare HMO

## 2019-02-06 ENCOUNTER — Observation Stay (HOSPITAL_COMMUNITY): Payer: Medicare HMO

## 2019-02-06 ENCOUNTER — Other Ambulatory Visit: Payer: Self-pay

## 2019-02-06 DIAGNOSIS — R531 Weakness: Secondary | ICD-10-CM | POA: Diagnosis not present

## 2019-02-06 DIAGNOSIS — J449 Chronic obstructive pulmonary disease, unspecified: Secondary | ICD-10-CM | POA: Diagnosis not present

## 2019-02-06 DIAGNOSIS — Z8673 Personal history of transient ischemic attack (TIA), and cerebral infarction without residual deficits: Secondary | ICD-10-CM | POA: Diagnosis not present

## 2019-02-06 DIAGNOSIS — E871 Hypo-osmolality and hyponatremia: Secondary | ICD-10-CM

## 2019-02-06 DIAGNOSIS — Z1159 Encounter for screening for other viral diseases: Secondary | ICD-10-CM | POA: Diagnosis not present

## 2019-02-06 DIAGNOSIS — R9431 Abnormal electrocardiogram [ECG] [EKG]: Secondary | ICD-10-CM

## 2019-02-06 DIAGNOSIS — A09 Infectious gastroenteritis and colitis, unspecified: Secondary | ICD-10-CM | POA: Diagnosis not present

## 2019-02-06 DIAGNOSIS — I5032 Chronic diastolic (congestive) heart failure: Secondary | ICD-10-CM | POA: Diagnosis not present

## 2019-02-06 DIAGNOSIS — I714 Abdominal aortic aneurysm, without rupture: Secondary | ICD-10-CM | POA: Diagnosis not present

## 2019-02-06 DIAGNOSIS — E1140 Type 2 diabetes mellitus with diabetic neuropathy, unspecified: Secondary | ICD-10-CM | POA: Diagnosis not present

## 2019-02-06 DIAGNOSIS — J9690 Respiratory failure, unspecified, unspecified whether with hypoxia or hypercapnia: Secondary | ICD-10-CM | POA: Diagnosis not present

## 2019-02-06 DIAGNOSIS — R4020 Unspecified coma: Secondary | ICD-10-CM | POA: Diagnosis not present

## 2019-02-06 DIAGNOSIS — I69351 Hemiplegia and hemiparesis following cerebral infarction affecting right dominant side: Secondary | ICD-10-CM | POA: Diagnosis not present

## 2019-02-06 DIAGNOSIS — E860 Dehydration: Secondary | ICD-10-CM | POA: Diagnosis present

## 2019-02-06 DIAGNOSIS — Z794 Long term (current) use of insulin: Secondary | ICD-10-CM

## 2019-02-06 DIAGNOSIS — I441 Atrioventricular block, second degree: Secondary | ICD-10-CM

## 2019-02-06 DIAGNOSIS — E876 Hypokalemia: Secondary | ICD-10-CM | POA: Diagnosis not present

## 2019-02-06 DIAGNOSIS — E1165 Type 2 diabetes mellitus with hyperglycemia: Secondary | ICD-10-CM

## 2019-02-06 LAB — HEPATIC FUNCTION PANEL
ALT: 15 U/L (ref 0–44)
AST: 13 U/L — ABNORMAL LOW (ref 15–41)
Albumin: 3.1 g/dL — ABNORMAL LOW (ref 3.5–5.0)
Alkaline Phosphatase: 127 U/L — ABNORMAL HIGH (ref 38–126)
Bilirubin, Direct: 0.2 mg/dL (ref 0.0–0.2)
Indirect Bilirubin: 0.6 mg/dL (ref 0.3–0.9)
Total Bilirubin: 0.8 mg/dL (ref 0.3–1.2)
Total Protein: 6.7 g/dL (ref 6.5–8.1)

## 2019-02-06 LAB — TROPONIN I (HIGH SENSITIVITY)
Troponin I (High Sensitivity): 9 ng/L (ref <18)
Troponin I (High Sensitivity): 10 ng/L (ref <18)

## 2019-02-06 LAB — GASTROINTESTINAL PANEL BY PCR, STOOL (REPLACES STOOL CULTURE)

## 2019-02-06 LAB — GLUCOSE, CAPILLARY
Glucose-Capillary: 165 mg/dL — ABNORMAL HIGH (ref 70–99)
Glucose-Capillary: 173 mg/dL — ABNORMAL HIGH (ref 70–99)
Glucose-Capillary: 232 mg/dL — ABNORMAL HIGH (ref 70–99)
Glucose-Capillary: 172 mg/dL — ABNORMAL HIGH (ref 70–99)
Glucose-Capillary: 179 mg/dL — ABNORMAL HIGH (ref 70–99)

## 2019-02-06 LAB — BASIC METABOLIC PANEL
Anion gap: 10 (ref 5–15)
BUN: 9 mg/dL (ref 8–23)
CO2: 25 mmol/L (ref 22–32)
Calcium: 8.5 mg/dL — ABNORMAL LOW (ref 8.9–10.3)
Chloride: 96 mmol/L — ABNORMAL LOW (ref 98–111)
Creatinine, Ser: 0.66 mg/dL (ref 0.61–1.24)
GFR calc Af Amer: >60 mL/min (ref >60)
GFR calc non Af Amer: >60 mL/min (ref >60)
Glucose, Bld: 190 mg/dL — ABNORMAL HIGH (ref 70–99)
Potassium: 3 mmol/L — ABNORMAL LOW (ref 3.5–5.1)
Sodium: 131 mmol/L — ABNORMAL LOW (ref 135–145)

## 2019-02-06 LAB — CK: Total CK: 77 U/L (ref 49–397)

## 2019-02-06 LAB — ECHOCARDIOGRAM COMPLETE
Height: 72 in
Weight: 3569.69 oz

## 2019-02-06 LAB — CBC
HCT: 40.2 % (ref 39.0–52.0)
Hemoglobin: 13 g/dL (ref 13.0–17.0)
MCH: 28.8 pg (ref 26.0–34.0)
MCHC: 32.3 g/dL (ref 30.0–36.0)
MCV: 88.9 fL (ref 80.0–100.0)
Platelets: 286 10*3/uL (ref 150–400)
RBC: 4.52 MIL/uL (ref 4.22–5.81)
RDW: 13 % (ref 11.5–15.5)
WBC: 12.4 10*3/uL — ABNORMAL HIGH (ref 4.0–10.5)
nRBC: 0 % (ref 0.0–0.2)

## 2019-02-06 LAB — LACTIC ACID, PLASMA: Lactic Acid, Venous: 1.2 mmol/L (ref 0.5–1.9)

## 2019-02-06 LAB — TSH: TSH: 1.291 u[IU]/mL (ref 0.350–4.500)

## 2019-02-06 LAB — MAGNESIUM: Magnesium: 1.9 mg/dL (ref 1.7–2.4)

## 2019-02-06 MED ORDER — POTASSIUM CHLORIDE IN NACL 20-0.9 MEQ/L-% IV SOLN
INTRAVENOUS | Status: DC
  Administered 2019-02-06: 05:00:00 via INTRAVENOUS
  Filled 2019-02-06 (×3): qty 1000

## 2019-02-06 MED ORDER — PANTOPRAZOLE SODIUM 40 MG PO TBEC
40.0000 mg | DELAYED_RELEASE_TABLET | Freq: Every day | ORAL | Status: DC
  Administered 2019-02-06 – 2019-02-07 (×2): 40 mg via ORAL
  Filled 2019-02-06 (×2): qty 1

## 2019-02-06 MED ORDER — LEVOTHYROXINE SODIUM 25 MCG PO TABS
125.0000 ug | ORAL_TABLET | Freq: Every day | ORAL | Status: DC
  Administered 2019-02-06 – 2019-02-07 (×2): 125 ug via ORAL
  Filled 2019-02-06 (×2): qty 1

## 2019-02-06 MED ORDER — ACETAMINOPHEN 325 MG PO TABS
650.0000 mg | ORAL_TABLET | Freq: Four times a day (QID) | ORAL | Status: DC | PRN
  Administered 2019-02-07: 650 mg via ORAL
  Filled 2019-02-06: qty 2

## 2019-02-06 MED ORDER — INSULIN ASPART 100 UNIT/ML ~~LOC~~ SOLN
0.0000 [IU] | Freq: Three times a day (TID) | SUBCUTANEOUS | Status: DC
  Administered 2019-02-06 (×2): 2 [IU] via SUBCUTANEOUS
  Administered 2019-02-06: 3 [IU] via SUBCUTANEOUS
  Administered 2019-02-07: 2 [IU] via SUBCUTANEOUS

## 2019-02-06 MED ORDER — TAMSULOSIN HCL 0.4 MG PO CAPS
0.4000 mg | ORAL_CAPSULE | Freq: Every day | ORAL | Status: DC
  Administered 2019-02-06: 0.4 mg via ORAL
  Filled 2019-02-06: qty 1

## 2019-02-06 MED ORDER — POTASSIUM CHLORIDE CRYS ER 20 MEQ PO TBCR
40.0000 meq | EXTENDED_RELEASE_TABLET | Freq: Once | ORAL | Status: AC
  Administered 2019-02-06: 40 meq via ORAL
  Filled 2019-02-06: qty 2

## 2019-02-06 MED ORDER — IPRATROPIUM-ALBUTEROL 0.5-2.5 (3) MG/3ML IN SOLN
3.0000 mL | Freq: Four times a day (QID) | RESPIRATORY_TRACT | Status: DC
  Administered 2019-02-06: 3 mL via RESPIRATORY_TRACT
  Filled 2019-02-06: qty 3

## 2019-02-06 MED ORDER — ALBUTEROL SULFATE (2.5 MG/3ML) 0.083% IN NEBU: 2.5000 mg | INHALATION_SOLUTION | Freq: Four times a day (QID) | RESPIRATORY_TRACT | Status: DC | PRN

## 2019-02-06 MED ORDER — ROSUVASTATIN CALCIUM 5 MG PO TABS
5.0000 mg | ORAL_TABLET | Freq: Every day | ORAL | Status: DC
  Administered 2019-02-06 – 2019-02-07 (×2): 5 mg via ORAL
  Filled 2019-02-06 (×2): qty 1

## 2019-02-06 MED ORDER — IPRATROPIUM-ALBUTEROL 0.5-2.5 (3) MG/3ML IN SOLN
3.0000 mL | Freq: Two times a day (BID) | RESPIRATORY_TRACT | Status: DC
  Administered 2019-02-07: 08:00:00 3 mL via RESPIRATORY_TRACT
  Filled 2019-02-06: qty 3

## 2019-02-06 MED ORDER — VENLAFAXINE HCL ER 150 MG PO CP24
150.0000 mg | ORAL_CAPSULE | Freq: Two times a day (BID) | ORAL | Status: DC
  Administered 2019-02-06 – 2019-02-07 (×3): 150 mg via ORAL
  Filled 2019-02-06 (×4): qty 1

## 2019-02-06 MED ORDER — BUSPIRONE HCL 5 MG PO TABS
10.0000 mg | ORAL_TABLET | Freq: Two times a day (BID) | ORAL | Status: DC
  Administered 2019-02-06 – 2019-02-07 (×3): 10 mg via ORAL
  Filled 2019-02-06 (×3): qty 2

## 2019-02-06 MED ORDER — ONDANSETRON HCL 4 MG/2ML IJ SOLN: 4.0000 mg | Freq: Four times a day (QID) | INTRAMUSCULAR | Status: DC | PRN

## 2019-02-06 MED ORDER — ALBUTEROL SULFATE HFA 108 (90 BASE) MCG/ACT IN AERS: 2.0000 | INHALATION_SPRAY | Freq: Four times a day (QID) | RESPIRATORY_TRACT | Status: DC | PRN

## 2019-02-06 MED ORDER — NALOXEGOL OXALATE 25 MG PO TABS
25.0000 mg | ORAL_TABLET | Freq: Every day | ORAL | Status: DC
  Administered 2019-02-06 – 2019-02-07 (×2): 25 mg via ORAL
  Filled 2019-02-06 (×2): qty 1

## 2019-02-06 MED ORDER — MORPHINE SULFATE ER 30 MG PO TBCR
30.0000 mg | EXTENDED_RELEASE_TABLET | Freq: Two times a day (BID) | ORAL | Status: DC | PRN
  Administered 2019-02-06: 30 mg via ORAL
  Filled 2019-02-06: qty 1

## 2019-02-06 MED ORDER — SODIUM CHLORIDE 0.9 % IV SOLN: INTRAVENOUS | Status: DC

## 2019-02-06 MED ORDER — ACETAMINOPHEN 650 MG RE SUPP: 650.0000 mg | Freq: Four times a day (QID) | RECTAL | Status: DC | PRN

## 2019-02-06 MED ORDER — HEPARIN SODIUM (PORCINE) 5000 UNIT/ML IJ SOLN
5000.0000 [IU] | Freq: Three times a day (TID) | INTRAMUSCULAR | Status: DC
  Administered 2019-02-06 – 2019-02-07 (×4): 5000 [IU] via SUBCUTANEOUS
  Filled 2019-02-06 (×4): qty 1

## 2019-02-06 MED ORDER — INSULIN GLARGINE 100 UNIT/ML ~~LOC~~ SOLN
15.0000 [IU] | Freq: Two times a day (BID) | SUBCUTANEOUS | Status: DC
  Administered 2019-02-06 – 2019-02-07 (×3): 15 [IU] via SUBCUTANEOUS
  Filled 2019-02-06 (×4): qty 0.15

## 2019-02-06 MED ORDER — GABAPENTIN 300 MG PO CAPS
300.0000 mg | ORAL_CAPSULE | Freq: Three times a day (TID) | ORAL | Status: DC
  Administered 2019-02-06 – 2019-02-07 (×3): 300 mg via ORAL
  Filled 2019-02-06 (×3): qty 1

## 2019-02-06 MED ORDER — TRAZODONE HCL 100 MG PO TABS
300.0000 mg | ORAL_TABLET | Freq: Every day | ORAL | Status: DC
  Administered 2019-02-06: 300 mg via ORAL
  Filled 2019-02-06 (×2): qty 3

## 2019-02-06 MED ORDER — CLOPIDOGREL BISULFATE 75 MG PO TABS
75.0000 mg | ORAL_TABLET | Freq: Every day | ORAL | Status: DC
  Administered 2019-02-06 – 2019-02-07 (×2): 75 mg via ORAL
  Filled 2019-02-06 (×2): qty 1

## 2019-02-06 MED ORDER — POTASSIUM CHLORIDE CRYS ER 20 MEQ PO TBCR
20.0000 meq | EXTENDED_RELEASE_TABLET | Freq: Once | ORAL | Status: AC
  Administered 2019-02-06: 20 meq via ORAL
  Filled 2019-02-06: qty 1

## 2019-02-06 MED ORDER — ONDANSETRON HCL 4 MG PO TABS: 4.0000 mg | ORAL_TABLET | Freq: Four times a day (QID) | ORAL | Status: DC | PRN

## 2019-02-06 NOTE — Consult Note (Signed)
Cardiology Consultation:   Patient ID: Brad Taylor MRN: [id number]; DOB: 10-28-1950  Admit date: 02/05/2019 Date of Consult: 02/06/2019  Primary Care Provider: [name], MD Primary Cardiologist: Dr Radford Pax  Patient Profile:   Brad Taylor is a 68 y.o. male with a hx of PAD, COPD who is being seen today for the evaluation of 2-1 block with Wenckebach episodes at the request of Rise Patience, MD.  History of Present Illness:   Brad Taylor is a 68 year old male with a history of COPD, diabetes, dyslipidemia, left-sided ischemic CVA, previously evaluated by Dr. Radford Pax for possible paroxysmal atrial fibrillation 30-day event monitor in 2015 showed no atrial fibrillation but frequent PVCs.  Normal LVEF by echocardiogram at that time.  Since 2015 in our clinic.  He was admitted on February 01, 2019 for critical right lower extremity ischemia with wound and underwent stent placement of the right external iliac artery and balloon angioplasty of the right tibioperoneal trunk. He presented to the ER on February 05, 2019 with diarrhea abdominal cramping, generalized weakness, hyponatremia hypokalemia, this is believed to be secondary to severe dehydration that is precipitated by diarrhea, patient is on enteric precaution, he is being hydrated and electrolytes are being replaced.  Telemetry showed Wenckebach episodes and cardiology was consulted. The patient denies any dizziness or falls at home.  No palpitations.  Heart Pathway Score:     Past Medical History:  Diagnosis Date   AAA (abdominal aortic aneurysm) ([place]) 5/15   3.5x3.5 cm   COPD (chronic obstructive pulmonary disease) (HCC)    Depression    Diabetes mellitus without complication (HCC)    GERD (gastroesophageal reflux disease)    Hyperlipidemia    Hypothyroidism    Post traumatic stress disorder    PVD (peripheral vascular disease) ([place])    Stroke [facility])    Thoracic aortic aneurysm ([place])    Thyroid disease     Ulcer    Past Surgical History:  Procedure Laterality Date   ABDOMINAL AORTOGRAM W/LOWER EXTREMITY N/A 08/20/2017   Procedure: ABDOMINAL AORTOGRAM W/LOWER EXTREMITY;  Surgeon: Waynetta Sandy, MD;  Location: [place] [facility];  Service: Cardiovascular;  Laterality: N/A;   ABDOMINAL AORTOGRAM W/LOWER EXTREMITY Bilateral 02/01/2019   Procedure: ABDOMINAL AORTOGRAM W/LOWER EXTREMITY;  Surgeon: Waynetta Sandy, MD;  Location: [place] [facility];  Service: Cardiovascular;  Laterality: Bilateral;   CATARACT EXTRACTION     CHOLECYSTECTOMY     ENDOVENOUS ABLATION SAPHENOUS VEIN W/ LASER Right 03/18/2018   endovenous laser ablation R GSV by Ruta Hinds MD    ENDOVENOUS ABLATION SAPHENOUS VEIN W/ LASER Right 04/08/2018   endovenous laser ablation R SSV by Ruta Hinds MD    [place], PARTIAL     right, 2 years ago at [facility] for cancer   PERIPHERAL VASCULAR ATHERECTOMY Right 08/20/2017   Procedure: PERIPHERAL VASCULAR ATHERECTOMY;  Surgeon: Waynetta Sandy, MD;  Location: [place] [facility];  Service: Cardiovascular;  Laterality: Right;  posterior tibial and tibeoperoneal trunk   PERIPHERAL VASCULAR BALLOON ANGIOPLASTY Right 08/20/2017   Procedure: PERIPHERAL VASCULAR BALLOON ANGIOPLASTY;  Surgeon: Waynetta Sandy, MD;  Location: [place] [facility];  Service: Cardiovascular;  Laterality: Right;  Anterior tibial   PERIPHERAL VASCULAR BALLOON ANGIOPLASTY  02/01/2019   Procedure: PERIPHERAL VASCULAR BALLOON ANGIOPLASTY;  Surgeon: Waynetta Sandy, MD;  Location: [place] [facility];  Service: Cardiovascular;;   PERIPHERAL VASCULAR INTERVENTION  02/01/2019   Procedure: PERIPHERAL VASCULAR  INTERVENTION;  Surgeon: Waynetta Sandy, MD;  Location: [place] [facility];  Service: Cardiovascular;;   VIDEO BRONCHOSCOPY Bilateral 12/30/2017   Procedure: VIDEO BRONCHOSCOPY WITHOUT FLUORO;  Surgeon: Marshell Garfinkel, MD;  Location:  [place] [facility];  Service: Cardiopulmonary;  Laterality: Bilateral;    Inpatient Medications: Scheduled Meds:  busPIRone  10 mg Oral BID   clopidogrel  75 mg Oral Q breakfast   gabapentin  300 mg Oral TID   heparin  5,000 Units Subcutaneous Q8H   insulin aspart  0-9 Units Subcutaneous TID WC   insulin glargine  15 Units Subcutaneous BID   levothyroxine  125 mcg Oral Q0600   naloxegol oxalate  25 mg Oral Daily   pantoprazole  40 mg Oral Daily   potassium chloride  40 mEq Oral Once   rosuvastatin  5 mg Oral Daily   sodium chloride (PF)       tamsulosin  0.4 mg Oral QHS   traZODone  300 mg Oral QHS   venlafaxine XR  150 mg Oral BID   Continuous Infusions:  0.9 % NaCl with KCl 20 mEq / L 100 mL/hr at 02/06/19 0431   PRN Meds: acetaminophen **OR** acetaminophen, albuterol, morphine, ondansetron **OR** ondansetron (ZOFRAN) IV  Allergies:   No Known Allergies  Social History:   Social History   Socioeconomic History   Marital status: Married    Spouse name: Diane   Number of children: 1   Years of education: HS   Highest education level: Not on file  Occupational History   Occupation: Retired Dealer    Comment: Librarian, 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 on file   Food insecurity    Worry: Not on file    Inability: Not on Lexicographer needs    Medical: Not on file    Non-medical: Not on file  Tobacco Use   Smoking status: Current Every Day Smoker    Packs/day: 1.00    Years: 30.00    Pack years: 30.00    Types: Cigarettes   Smokeless tobacco: Former Systems developer    Quit date: 05/04/2013   Tobacco comment: Less than 1/2 pk per day  Substance and Sexual Activity   Alcohol use: No    Comment: Former heavy drinker, been sober for 10 years   Drug use: No   Sexual activity: Not on file  Lifestyle   Physical activity    Days per week: Not on file    Minutes per session: Not on file   Stress: Not on file  Relationships    Social connections    Talks on phone: Not on file    Gets together: Not on file    Attends religious service: Not on file    Active member of club or organization: Not on file    Attends meetings of clubs or organizations: Not on file    Relationship status: Not on file   Intimate partner violence    Fear of current or ex partner: Not on file    Emotionally abused: Not on file    Physically abused: Not on file    Forced sexual activity: Not on file  Other Topics Concern   Not on file  Social History Narrative   Patient lives at home with spouse.   Caffeine Use: 3-4 cups daily   Retired Dealer.    Family History:    Family History  Problem Relation Age of Onset   Emphysema Father  Thyroid disease Sister    Stroke Maternal Grandmother    Thyroid disease Sister    Thyroid disease Sister     ROS:  Please see the history of present illness.  All other ROS reviewed and negative.     Physical Exam/Data:   Vitals:   02/05/19 2330 02/06/19 0030 02/06/19 0201 02/06/19 0436  BP: (!) 152/91 140/70 137/68 (!) 153/73  Pulse: 77 69 70 (!) 59  Resp:  17 20 20   Temp:   98.7 F (37.1 C) 98.9 F (37.2 C)  TempSrc:   Oral Oral  SpO2: 97% 95% 94% 97%  Weight:    101.2 kg  Height:        Intake/Output Summary (Last 24 hours) at 02/06/2019 0946 Last data filed at 02/06/2019 0700 Gross per 24 hour  Intake 250 ml  Output --  Net 250 ml   Last 3 Weights 02/06/2019 02/05/2019 02/03/2019  Weight (lbs) 223 lb 1.7 oz 236 lb 235 lb  Weight (kg) 101.2 kg 107.049 kg 106.595 kg     Body mass index is 30.26 kg/m.  General:  Well nourished, appears somnolonet HEENT: normal Lymph: no adenopathy Neck: no JVD Endocrine:  No thryomegaly Vascular: No carotid bruits; FA pulses 2+ bilaterally without bruits  Cardiac:  normal S1, S2; RRR; no murmur  Lungs:  clear to auscultation bilaterally, no wheezing, rhonchi or rales  Abd: soft, nontender, no hepatomegaly  Ext: no edema, right  lower extremity wrapped, left lower extremity appears cyanotic with weak pulses. Musculoskeletal:  No deformities, BUE and BLE strength normal and equal Skin: warm and dry  Neuro:  CNs 2-12 intact, no focal abnormalities noted Psych:  Normal affect   EKG:  The EKG was personally reviewed and demonstrates: Sinus bradycardia with PACs Telemetry:  Telemetry was personally reviewed and demonstrates: Currently sinus rhythm previously frequent PVCs and intermittent 2-1 block with Wenckebach episodes.  Relevant CV Studies:   Laboratory Data:  High Sensitivity Troponin:   Recent Labs  Lab 02/06/19 0527  TROPONINIHS 9     Cardiac EnzymesNo results for input(s): TROPONINI in the last 168 hours. No results for input(s): TROPIPOC in the last 168 hours.  Chemistry Recent Labs  Lab 02/01/19 0717 02/05/19 1940 02/06/19 0527  NA 133* 129* 131*  K 4.4 3.3* 3.0*  CL 94* 92* 96*  CO2  --  23 25  GLUCOSE 148* 249* 190*  BUN 11 12 9   CREATININE 0.70 0.72 0.66  CALCIUM  --  8.8* 8.5*  GFRNONAA  --  >60 >60  GFRAA  --  >60 >60  ANIONGAP  --  14 10    Recent Labs  Lab 02/05/19 1940 02/06/19 0527  PROT 7.4 6.7  ALBUMIN 3.3* 3.1*  AST 16 13*  ALT 17 15  ALKPHOS 153* 127*  BILITOT 0.5 0.8   Hematology Recent Labs  Lab 02/01/19 0717 02/05/19 1940 02/06/19 0527  WBC  --  15.4* 12.4*  RBC  --  4.70 4.52  HGB 14.3 13.6 13.0  HCT 42.0 41.0 40.2  MCV  --  87.2 88.9  MCH  --  28.9 28.8  MCHC  --  33.2 32.3  RDW  --  13.0 13.0  PLT  --  306 286   BNPNo results for input(s): BNP, PROBNP in the last 168 hours.  DDimer No results for input(s): DDIMER in the last 168 hours.   Radiology/Studies:  Ct Head Wo Contrast  Result Date: 02/06/2019 CLINICAL DATA:  68 y/o  M; Altered level of consciousness (LOC), unexplained. EXAM: CT HEAD WITHOUT CONTRAST TECHNIQUE: Contiguous axial images were obtained from the base of the skull through the vertex without intravenous contrast. COMPARISON:   09/12/2018 CT head.  09/05/2018 MRI head. FINDINGS: Brain: No evidence of acute infarction, hemorrhage, hydrocephalus, extra-axial collection or mass lesion/mass effect. Stable large confluent white matter hypodensities compatible with advanced chronic microvascular ischemic changes. Stable volume loss of the brain. Multiple stable small chronic infarctions within the left basal ganglia and posterior limb of internal capsule. Vascular: Calcific atherosclerosis of the internal carotid arteries. No hyperdense vessel identified. Skull: Normal. Negative for fracture or focal lesion. Sinuses/Orbits: Partial opacification of left posterior ethmoid air cells and mucosal thickening in the left maxillary sinus. Normal aeration of occluded mastoid air cells. Bilateral intra-ocular lens replacement. Other: None. IMPRESSION: 1. No acute intracranial abnormality identified. 2. Stable advanced chronic microvascular ischemic, volume loss, and multiple small chronic infarctions in the left basal ganglia/posterior limb of internal capsule. Electronically Signed   By: Kristine Garbe M.D.   On: 02/06/2019 03:23   Ct Abdomen Pelvis W Contrast  Result Date: 02/05/2019 CLINICAL DATA:  Abdominal distension and weakness. Post recent abdominal aortogram. EXAM: CT ABDOMEN AND PELVIS WITH CONTRAST TECHNIQUE: Multidetector CT imaging of the abdomen and pelvis was performed using the standard protocol following bolus administration of intravenous contrast. CONTRAST:  137mL OMNIPAQUE IOHEXOL 300 MG/ML  SOLN COMPARISON:  CT 09/12/2018 FINDINGS: Lower chest: Postsurgical change in the right hemithorax, partially included. Right pericardial fluid grossly unchanged. There are coronary artery calcifications. Hepatobiliary: No focal abnormality. Postcholecystectomy without biliary dilatation. Pancreas: No ductal dilatation or inflammation. Spleen: Normal in size without focal abnormality. Adrenals/Urinary Tract: No adrenal nodule.  Symmetric bilateral perinephric edema. Multiple bilateral renal cysts. No hydronephrosis. Symmetric excretion on delayed phase imaging. Urinary bladder is nondistended but thick walled. Stomach/Bowel: Stomach is nondistended. No small bowel inflammation or obstruction. Liquid stool in the cecum and ascending colon, no associated colonic wall thickening. Redundant sigmoid colon with moderate colonic stool. No significant colonic inflammation. Normal appendix. Vascular/Lymphatic: Irregular calcified and noncalcified atheromatous plaque throughout the abdominal aorta. Aneurysmal dilatation of the infrarenal portion 3.4 cm, previously 3.3 cm. No significant periaortic stranding. Left common iliac artery aneurysm at 2.7 cm, previously 2.6. Right common iliac artery aneurysm at 2.7 cm, unchanged. Stranding in the left groin in the region of vascular access with minimal soft tissue density, no pseudoaneurysm or dominant hematoma. Prominent bilateral external iliac nodes. Reproductive: Prominent prostate gland spans 5.1 cm. Other: No significant ascites or free fluid. No free air. Musculoskeletal: L1 compression fracture has progressed from February 2020 abdominal CT with increased loss of height and superior endplate fragmentation. Associated vacuum phenomena and T12-L1 disc space. IMPRESSION: 1. Liquid stool in the cecum and ascending colon without colonic inflammation or colonic wall thickening. Findings can be seen with diarrheal illness. 2. Bladder wall thickening, likely secondary to chronic bladder outlet obstruction, recommend correlation with urinalysis to exclude urinary tract infection. 3. Soft tissue stranding in the left groin likely related to recent vascular access, no evidence of pseudoaneurysm or dominant hematoma. 4. Bilateral common iliac artery aneurysms, 2.7 cm on the left and 2.7 cm on the right. Infrarenal aortic aneurysm at 3.4 cm with advanced heterogeneous calcified and noncalcified atheromatous  plaque. 5. Moderate L1 compression fracture with progressive loss of height since February. Aortic Atherosclerosis (ICD10-I70.0). Electronically Signed   By: Keith Rake M.D.   On: 02/05/2019 23:41   Dg Abd Acute 2+v  W 1v Chest  Result Date: 02/05/2019 CLINICAL DATA:  Procedure on left groin 2 days ago. Generalized weakness and diarrhea. EXAM: DG ABDOMEN ACUTE W/ 1V CHEST COMPARISON:  CT scan September 12, 2018 FINDINGS: The patient is status post right upper lobectomy. Changes in the right hilum and volume loss on the right are consistent with previous surgery. Today's x-ray is similar in appearance compared to the scout film from September 12, 2018. No pneumothorax. No change in the cardiomediastinal silhouette. No acute infiltrate. Elevation the right hemidiaphragm remains. No free air, portal venous gas, or pneumatosis. No bowel dilatation to suggest obstruction. IMPRESSION: No acute abnormalities noted. Postoperative changes in the right chest. Electronically Signed   By: Dorise Bullion III M.D   On: 02/05/2019 20:24    Assessment and Plan:   1.  2-1 block with Wenckebach episodes, frequent PACs and PVCs episodes of sinus bradycardia, the patient is asymptomatic, this is most probably secondary to significant hypokalemia and hyponatremia, continue to supplement sodium and potassium.  The patient has no signs of fluid overload but is rather dehydrated.  We will continue to monitor his telemetry. I would order an echocardiogram since he has not had any in the last 5 years.  2.  Diarrhea, dehydration -per primary team  3.  PAD -any intervention by Dr. [name]  For questions or updates, please contact [facility] Please consult [email or web address] for contact info under   Signed, Ena Dawley, MD  02/06/2019 9:46 AM

## 2019-02-06 NOTE — Plan of Care (Signed)
Patient has been encouraged to change post ion every 2 hours. Pt  made aware that RN /NT would come in to assist to preserve skin integrity. Pt verbalized unsderstanding.

## 2019-02-06 NOTE — Progress Notes (Signed)
Patients spouse Diane contacted with update regarding plan of care. No questions reported at this time.

## 2019-02-06 NOTE — Progress Notes (Signed)
*  PRELIMINARY RESULTS* Echocardiogram 2D Echocardiogram has been performed.  Leavy Cella 02/06/2019, 3:15 PM

## 2019-02-06 NOTE — Progress Notes (Signed)
Patient A&Ox4, appears forgetful &  Restless. VS assessed, Oxygen saturation abnormal at 82 % on ra, RR 26-28. 2 liters oxygen applied [place], O2 sat increased.  Provider contacted. Will cntinue to monitor.

## 2019-02-06 NOTE — H&P (Addendum)
History and Physical    Brad Taylor XKG:[id number] DOB: September 13, 1950 DOA: 02/05/2019  PCP: [name], MD   Patient coming from: Home.  History obtained from patient's wife and patient.  Chief Complaint: Weakness.  HPI: Brad Taylor is a 68 y.o. male with history of stroke with right-sided hemiparesis, COPD, sleep apnea, diabetes mellitus, abdominal aortic aneurysm, peripheral vascular disease status post recent procedure done by Dr. [name] vascular surgeon on February 01, 2019 about 5 days ago wherein patient had stent of the right external iliac artery and balloon angioplasty of the right tibioperoneal trunk has been feeling weak last few days since the procedure.  Per patient's wife patient usually ambulates but last 3 days has been feeling increasingly weak and has been having diarrhea with no vomiting abdominal pain.  Denies any chest pain shortness of breath fever or chills.  Patient is feeling intensely weak that he was not able to get out of the bed.  Has not had any falls.  No change in medications.  Also has some pain around the left groin where he had the procedure done.  ED Course: In the ER patient's labs show sodium of 129 potassium 3.3 WBC 15.4 lactic acid 1.2 COVID-19 was negative.  Acute abdominal series was unremarkable.  Had a CT abdomen and pelvis which showed diarrheal process and also thickening of the urinary bladder concerning for obstruction or UTI.  No some stranding around the left groin area where patient had a procedure.  But no definite evidence of any hematoma.  CT head was negative.  Patient was given potassium replacement IV fluids and admitted for generalized weakness with hyponatremia hypokalemia likely from diarrhea and dehydration.  Patient does not recall taking any recent antibiotics.  Review of Systems: As per HPI, rest all negative.   Past Medical History:  Diagnosis Date   AAA (abdominal aortic aneurysm) ([place]) 5/15   3.5x3.5 cm   COPD (chronic  obstructive pulmonary disease) (HCC)    Depression    Diabetes mellitus without complication (HCC)    GERD (gastroesophageal reflux disease)    Hyperlipidemia    Hypothyroidism    Post traumatic stress disorder    PVD (peripheral vascular disease) ([place])    Stroke [facility])    Thoracic aortic aneurysm ([place])    Thyroid disease    Ulcer     Past Surgical History:  Procedure Laterality Date   ABDOMINAL AORTOGRAM W/LOWER EXTREMITY N/A 08/20/2017   Procedure: ABDOMINAL AORTOGRAM W/LOWER EXTREMITY;  Surgeon: Waynetta Sandy, MD;  Location: [place] [facility];  Service: Cardiovascular;  Laterality: N/A;   ABDOMINAL AORTOGRAM W/LOWER EXTREMITY Bilateral 02/01/2019   Procedure: ABDOMINAL AORTOGRAM W/LOWER EXTREMITY;  Surgeon: Waynetta Sandy, MD;  Location: [place] [facility];  Service: Cardiovascular;  Laterality: Bilateral;   CATARACT EXTRACTION     CHOLECYSTECTOMY     ENDOVENOUS ABLATION SAPHENOUS VEIN W/ LASER Right 03/18/2018   endovenous laser ablation R GSV by Ruta Hinds MD    ENDOVENOUS ABLATION SAPHENOUS VEIN W/ LASER Right 04/08/2018   endovenous laser ablation R SSV by Ruta Hinds MD    [place], PARTIAL     right, 2 years ago at [facility] for cancer   PERIPHERAL VASCULAR ATHERECTOMY Right 08/20/2017   Procedure: PERIPHERAL VASCULAR ATHERECTOMY;  Surgeon: Waynetta Sandy, MD;  Location: [place] [facility];  Service: Cardiovascular;  Laterality: Right;  posterior tibial and tibeoperoneal trunk   PERIPHERAL VASCULAR BALLOON  ANGIOPLASTY Right 08/20/2017   Procedure: PERIPHERAL VASCULAR BALLOON ANGIOPLASTY;  Surgeon: Waynetta Sandy, MD;  Location: [place] [facility];  Service: Cardiovascular;  Laterality: Right;  Anterior tibial   PERIPHERAL VASCULAR BALLOON ANGIOPLASTY  02/01/2019   Procedure: PERIPHERAL VASCULAR BALLOON ANGIOPLASTY;  Surgeon: Waynetta Sandy, MD;  Location: [place] [facility];   Service: Cardiovascular;;   PERIPHERAL VASCULAR INTERVENTION  02/01/2019   Procedure: PERIPHERAL VASCULAR INTERVENTION;  Surgeon: Waynetta Sandy, MD;  Location: [place] [facility];  Service: Cardiovascular;;   VIDEO BRONCHOSCOPY Bilateral 12/30/2017   Procedure: VIDEO BRONCHOSCOPY WITHOUT FLUORO;  Surgeon: Marshell Garfinkel, MD;  Location: [facility];  Service: Cardiopulmonary;  Laterality: Bilateral;     reports that he has been smoking cigarettes. He has a 30.00 pack-year smoking history. He quit smokeless tobacco use about 5 years ago. He reports that he does not drink alcohol or use drugs.  No Known Allergies  Family History  Problem Relation Age of Onset   Emphysema Father    Thyroid disease Sister    Stroke Maternal Grandmother    Thyroid disease Sister    Thyroid disease Sister     Prior to Admission medications   Medication Sig Start Date End Date Taking? Authorizing Provider  albuterol (PROVENTIL HFA;VENTOLIN HFA) 108 (90 Base) MCG/ACT inhaler INHALE 2 PUFFS INTO THE LUNGS EVERY 6 HOURS AS NEEDED FOR WHEEZE OR SHORTNESS OF BREATH Patient taking differently: Inhale 2 puffs into the lungs every 6 (six) hours as needed for wheezing or shortness of breath.  12/16/17  Yes [name], MD  busPIRone (BUSPAR) 10 MG tablet Take 0.5 tablets (5 mg total) by mouth 2 (two) times daily as needed (for anxiety). Patient taking differently: Take 10 mg by mouth 2 (two) times daily.  10/08/13  Yes Angiulli, Lavon Paganini, PA-C  clopidogrel (PLAVIX) 75 MG tablet Take 1 tablet (75 mg total) by mouth daily with breakfast. 02/01/14  Yes Lam, Rudi Rummage, NP  fluticasone (FLONASE) 50 MCG/ACT nasal spray Place 2 sprays into both nostrils daily. Patient taking differently: Place 2 sprays into both nostrils daily as needed for allergies.  12/08/18  Yes [name], MD  gabapentin (NEURONTIN) 300 MG capsule Take 1 capsule (300 mg total) by mouth 3 (three) times daily. 11/07/17  Yes [name], MD  insulin glargine (LANTUS) 100 UNIT/ML injection Inject 45-55 Units into the skin See admin instructions. Take 55 units in the morning and 45 units at night 10/08/13  Yes Angiulli, Lavon Paganini, PA-C  levothyroxine (SYNTHROID, LEVOTHROID) 125 MCG tablet Take 1 tablet (125 mcg total) by mouth daily before breakfast. 10/08/13  Yes Angiulli, Lavon Paganini, PA-C  metFORMIN (GLUCOPHAGE) 500 MG tablet Take 500 mg by mouth 2 (two) times daily with a meal.   Yes [provider]  Morphine Sulfate ER 30 MG T12A Take 30 mg by mouth every 12 (twelve) hours as needed for pain.    Yes [provider]  MOVANTIK 25 MG TABS tablet TAKE 1 TABLET BY MOUTH EVERY DAY Patient taking differently: Take 25 mg by mouth daily.  04/23/18  Yes [name], MD  RABEprazole (ACIPHEX) 20 MG tablet Take 2 tablets (40 mg total) by mouth daily before breakfast. 10/08/13  Yes Angiulli, Lavon Paganini, PA-C  rosuvastatin (CRESTOR) 10 MG tablet Take 5 mg by mouth daily.   Yes [provider]  tamsulosin (FLOMAX) 0.4 MG CAPS capsule Take 0.4 mg by mouth at bedtime.    Yes  [provider]  traZODone (DESYREL) 100 MG tablet Take 3 tablets (300 mg total) by mouth at bedtime. 10/08/13  Yes Angiulli, Lavon Paganini, PA-C  triamcinolone cream (KENALOG) 0.1 % Apply 1 application topically 2 (two) times daily as needed (rash).   Yes [provider]  venlafaxine XR (EFFEXOR-XR) 150 MG 24 hr capsule Take 1 capsule (150 mg total) by mouth daily with breakfast. Patient taking differently: Take 150 mg by mouth 2 (two) times daily.  10/08/13  Yes Angiulli, Lavon Paganini, PA-C  amphetamine-dextroamphetamine (ADDERALL XR) 15 MG 24 hr capsule Take 1 capsule by mouth every morning. Patient not taking: Reported on 02/05/2019 11/09/18   [name], MD  cloNIDine (CATAPRES - DOSED IN MG/24 HR) 0.1 mg/24hr patch Place 1 patch (0.1 mg total) onto the skin once a week. Patient not taking: Reported on 02/05/2019 12/24/18   [name], MD  doxycycline (VIBRAMYCIN) 100 MG capsule Take 1 capsule (100 mg total) by mouth 2 (two) times daily. Patient not taking: Reported on 02/05/2019 01/14/19   Wurst, [place], PA-C  Fluticasone-Umeclidin-Vilant (TRELEGY ELLIPTA) 100-62.5-25 MCG/INH AEPB Inhale 1 Inhaler into the lungs daily. Patient not taking: Reported on 02/05/2019 09/24/18   [name], MD  meclizine (ANTIVERT) 25 MG tablet Take 1 tablet (25 mg total) by mouth 3 (three) times daily as needed for dizziness (vertigo). Patient not taking: Reported on 02/05/2019 12/08/18   [name], MD  metroNIDAZOLE (FLAGYL) 500 MG tablet Take 1 tablet (500 mg total) by mouth 2 (two) times daily. Patient not taking: Reported on 02/05/2019 12/22/18   [name], MD  Tiotropium Bromide-Olodaterol (STIOLTO RESPIMAT) 2.5-2.5 MCG/ACT AERS Inhale 2 Inhalers into the lungs daily. Patient not taking: Reported on 02/05/2019 07/09/18   [name], MD    Physical Exam: Constitutional: Moderately built and nourished. Vitals:   02/05/19 2230 02/05/19 2330 02/06/19 0030 02/06/19 0201  BP: 120/61 (!) 152/91 140/70 137/68  Pulse: 71 77 69 70  Resp: 17  17 20   Temp:    98.7 F (37.1 C)  TempSrc:    Oral  SpO2: 92% 97% 95% 94%  Weight:      Height:       Eyes: Anicteric no pallor. ENMT: No discharge from the ears eyes nose or mouth. Neck: No mass felt.  No neck rigidity. Respiratory: No rhonchi or crepitations. Cardiovascular: S1-S2 heard. Abdomen: Soft nontender bowel sounds present. Musculoskeletal: Has chronic bruising of the right leg.  Small bruising around the left groin area. Skin: Small bruising around the left groin area. Neurologic: Alert awake oriented to time place and person.  Mild weakness of the right upper and lower extremity from previous stroke.  Left upper and lower extremities 5 x 5. Psychiatric: Appears normal per normal affect.   Labs on Admission: I have personally reviewed following labs and imaging  studies  CBC: Recent Labs  Lab 02/01/19 0717 02/05/19 1940  WBC  --  15.4*  NEUTROABS  --  11.9*  HGB 14.3 13.6  HCT 42.0 41.0  MCV  --  87.2  PLT  --  127   Basic Metabolic Panel: Recent Labs  Lab 02/01/19 0717 02/05/19 1940  NA 133* 129*  K 4.4 3.3*  CL 94* 92*  CO2  --  23  GLUCOSE 148* 249*  BUN 11 12  CREATININE 0.70 0.72  CALCIUM  --  8.8*   GFR: Estimated Creatinine Clearance: 108.6 mL/min (by C-G formula based on SCr of  0.72 mg/dL). Liver Function Tests: Recent Labs  Lab 02/05/19 1940  AST 16  ALT 17  ALKPHOS 153*  BILITOT 0.5  PROT 7.4  ALBUMIN 3.3*   No results for input(s): LIPASE, AMYLASE in the last 168 hours. No results for input(s): AMMONIA in the last 168 hours. Coagulation Profile: No results for input(s): INR, PROTIME in the last 168 hours. Cardiac Enzymes: No results for input(s): CKTOTAL, CKMB, CKMBINDEX, TROPONINI in the last 168 hours. BNP (last 3 results) No results for input(s): PROBNP in the last 8760 hours. HbA1C: No results for input(s): HGBA1C in the last 72 hours. CBG: Recent Labs  Lab 02/01/19 0610 02/01/19 0952 02/06/19 0206  GLUCAP 141* 154* 165*   Lipid Profile: No results for input(s): CHOL, HDL, LDLCALC, TRIG, CHOLHDL, LDLDIRECT in the last 72 hours. Thyroid Function Tests: No results for input(s): TSH, T4TOTAL, FREET4, T3FREE, THYROIDAB in the last 72 hours. Anemia Panel: No results for input(s): VITAMINB12, FOLATE, FERRITIN, TIBC, IRON, RETICCTPCT in the last 72 hours. Urine analysis:    Component Value Date/Time   COLORURINE YELLOW 02/05/2019 1940   APPEARANCEUR CLEAR 02/05/2019 1940   LABSPEC 1.025 02/05/2019 1940   PHURINE 6.0 02/05/2019 1940   GLUCOSEU 50 (A) 02/05/2019 1940   HGBUR NEGATIVE 02/05/2019 1940   BILIRUBINUR NEGATIVE 02/05/2019 1940   KETONESUR 20 (A) 02/05/2019 1940   PROTEINUR 30 (A) 02/05/2019 1940   UROBILINOGEN 1.0 09/25/2013 1009   NITRITE NEGATIVE 02/05/2019 1940   LEUKOCYTESUR  NEGATIVE 02/05/2019 1940   Sepsis Labs: @LABRCNTIP (procalcitonin:4,lacticidven:4) ) Recent Results (from the past 240 hour(s))  SARS Coronavirus 2 (Performed in [facility] hospital lab)     Status: None   Collection Time: 01/29/19  2:45 PM   Specimen: Nasal Swab  Result Value Ref Range Status   SARS Coronavirus 2 NEGATIVE NEGATIVE Final    Comment: (NOTE) SARS-CoV-2 target nucleic acids are NOT DETECTED. The SARS-CoV-2 RNA is generally detectable in upper and lower respiratory specimens during the acute phase of infection. Negative results do not preclude SARS-CoV-2 infection, do not rule out co-infections with other pathogens, and should not be used as the sole basis for treatment or other patient management decisions. Negative results must be combined with clinical observations, patient history, and epidemiological information. The expected result is Negative. Fact Sheet for Patients: SugarRoll.be Fact Sheet for Healthcare Providers: https://www.woods-mathews.com/ This test is not yet approved or cleared by the [place] FDA and  has been authorized for detection and/or diagnosis of SARS-CoV-2 by FDA under an Emergency Use Authorization (EUA). This EUA will remain  in effect (meaning this test can be used) for the duration of the COVID-19 declaration under Section 56 4(b)(1) of the Act, 21 U.S.C. section 360bbb-3(b)(1), unless the authorization is terminated or revoked sooner. Performed at [facility], [place] [address] [place], [place] [address]   SARS Coronavirus 2 (CEPHEID- Performed in [place] hospital lab), Hosp Order     Status: None   Collection Time: 02/05/19  7:43 PM   Specimen: Nasopharyngeal Swab  Result Value Ref Range Status   SARS Coronavirus 2 NEGATIVE NEGATIVE Final    Comment: (NOTE) If result is NEGATIVE SARS-CoV-2 target nucleic acids are NOT DETECTED. The SARS-CoV-2 RNA is generally detectable in  upper and lower  respiratory specimens during the acute phase of infection. The lowest  concentration of SARS-CoV-2 viral copies this assay can detect is 250  copies / mL. A negative result does not preclude SARS-CoV-2 infection  and should  not be used as the sole basis for treatment or other  patient management decisions.  A negative result may occur with  improper specimen collection / handling, submission of specimen other  than nasopharyngeal swab, presence of viral mutation(s) within the  areas targeted by this assay, and inadequate number of viral copies  (<250 copies / mL). A negative result must be combined with clinical  observations, patient history, and epidemiological information. If result is POSITIVE SARS-CoV-2 target nucleic acids are DETECTED. The SARS-CoV-2 RNA is generally detectable in upper and lower  respiratory specimens dur ing the acute phase of infection.  Positive  results are indicative of active infection with SARS-CoV-2.  Clinical  correlation with patient history and other diagnostic information is  necessary to determine patient infection status.  Positive results do  not rule out bacterial infection or co-infection with other viruses. If result is PRESUMPTIVE POSTIVE SARS-CoV-2 nucleic acids MAY BE PRESENT.   A presumptive positive result was obtained on the submitted specimen  and confirmed on repeat testing.  While 2019 novel coronavirus  (SARS-CoV-2) nucleic acids may be present in the submitted sample  additional confirmatory testing may be necessary for epidemiological  and / or clinical management purposes  to differentiate between  SARS-CoV-2 and other Sarbecovirus currently known to infect humans.  If clinically indicated additional testing with an alternate test  methodology [phone]) is advised. The SARS-CoV-2 RNA is generally  detectable in upper and lower respiratory sp ecimens during the acute  phase of infection. The expected result is  Negative. Fact Sheet for Patients:  StrictlyIdeas.no Fact Sheet for Healthcare Providers: BankingDealers.co.za This test is not yet approved or cleared by the [place] FDA and has been authorized for detection and/or diagnosis of SARS-CoV-2 by FDA under an Emergency Use Authorization (EUA).  This EUA will remain in effect (meaning this test can be used) for the duration of the COVID-19 declaration under Section 564(b)(1) of the Act, 21 U.S.C. section 360bbb-3(b)(1), unless the authorization is terminated or revoked sooner. Performed at [facility], [place] [address] [place], [place] [address]      Radiological Exams on Admission: Ct Abdomen Pelvis W Contrast  Result Date: 02/05/2019 CLINICAL DATA:  Abdominal distension and weakness. Post recent abdominal aortogram. EXAM: CT ABDOMEN AND PELVIS WITH CONTRAST TECHNIQUE: Multidetector CT imaging of the abdomen and pelvis was performed using the standard protocol following bolus administration of intravenous contrast. CONTRAST:  180mL OMNIPAQUE IOHEXOL 300 MG/ML  SOLN COMPARISON:  CT 09/12/2018 FINDINGS: Lower chest: Postsurgical change in the right hemithorax, partially included. Right pericardial fluid grossly unchanged. There are coronary artery calcifications. Hepatobiliary: No focal abnormality. Postcholecystectomy without biliary dilatation. Pancreas: No ductal dilatation or inflammation. Spleen: Normal in size without focal abnormality. Adrenals/Urinary Tract: No adrenal nodule. Symmetric bilateral perinephric edema. Multiple bilateral renal cysts. No hydronephrosis. Symmetric excretion on delayed phase imaging. Urinary bladder is nondistended but thick walled. Stomach/Bowel: Stomach is nondistended. No small bowel inflammation or obstruction. Liquid stool in the cecum and ascending colon, no associated colonic wall thickening. Redundant sigmoid colon with moderate colonic  stool. No significant colonic inflammation. Normal appendix. Vascular/Lymphatic: Irregular calcified and noncalcified atheromatous plaque throughout the abdominal aorta. Aneurysmal dilatation of the infrarenal portion 3.4 cm, previously 3.3 cm. No significant periaortic stranding. Left common iliac artery aneurysm at 2.7 cm, previously 2.6. Right common iliac artery aneurysm at 2.7 cm, unchanged. Stranding in the left groin in the region of vascular access with minimal soft tissue density, no pseudoaneurysm  or dominant hematoma. Prominent bilateral external iliac nodes. Reproductive: Prominent prostate gland spans 5.1 cm. Other: No significant ascites or free fluid. No free air. Musculoskeletal: L1 compression fracture has progressed from February 2020 abdominal CT with increased loss of height and superior endplate fragmentation. Associated vacuum phenomena and T12-L1 disc space. IMPRESSION: 1. Liquid stool in the cecum and ascending colon without colonic inflammation or colonic wall thickening. Findings can be seen with diarrheal illness. 2. Bladder wall thickening, likely secondary to chronic bladder outlet obstruction, recommend correlation with urinalysis to exclude urinary tract infection. 3. Soft tissue stranding in the left groin likely related to recent vascular access, no evidence of pseudoaneurysm or dominant hematoma. 4. Bilateral common iliac artery aneurysms, 2.7 cm on the left and 2.7 cm on the right. Infrarenal aortic aneurysm at 3.4 cm with advanced heterogeneous calcified and noncalcified atheromatous plaque. 5. Moderate L1 compression fracture with progressive loss of height since February. Aortic Atherosclerosis (ICD10-I70.0). Electronically Signed   By: Keith Rake M.D.   On: 02/05/2019 23:41   Dg Abd Acute 2+v W 1v Chest  Result Date: 02/05/2019 CLINICAL DATA:  Procedure on left groin 2 days ago. Generalized weakness and diarrhea. EXAM: DG ABDOMEN ACUTE W/ 1V CHEST COMPARISON:  CT scan  September 12, 2018 FINDINGS: The patient is status post right upper lobectomy. Changes in the right hilum and volume loss on the right are consistent with previous surgery. Today's x-ray is similar in appearance compared to the scout film from September 12, 2018. No pneumothorax. No change in the cardiomediastinal silhouette. No acute infiltrate. Elevation the right hemidiaphragm remains. No free air, portal venous gas, or pneumatosis. No bowel dilatation to suggest obstruction. IMPRESSION: No acute abnormalities noted. Postoperative changes in the right chest. Electronically Signed   By: Dorise Bullion III M.D   On: 02/05/2019 20:24     Assessment/Plan Principal Problem:   Generalized weakness Active Problems:   Uncontrolled type 2 diabetes mellitus with diabetic neuropathy, with long-term current use of insulin (HCC)   Hemiplegia and hemiparesis following unspecified cerebrovascular disease affecting unspecified side (HCC)   Hypothyroidism   Chronic diastolic CHF (congestive heart failure) (HCC)   H/O: lung cancer   History of stroke   PVD (peripheral vascular disease) ([place])   Dehydration   Weakness   Hyponatremia    1. Generalized weakness with difficulty ambulating likely from severe dehydration with hyponatremia hypokalemia likely precipitated by diarrhea -at the time of my exam patient just had another episode of diarrhea.  Will check GI pathogen panel.  Enteric precaution.  Hydrate.  Physical therapy.  Replace electrolytes as needed.  In addition we will check CK TSH troponin. 2. Hyponatremia and hypokalemia likely from diarrhea replace and recheck.  Patient is on normal saline with potassium. 3. Recent right lower extremity intervention by Dr. [name] vascular surgeon stent of the right external iliac artery and balloon angioplasty of the right tibioperoneal trunk.  Mall ecchymotic area of the left groin.  CAT scan does not show any acute. 4. Chronic wound of the right lower extremity will  get wound team consult. 5. Diabetes mellitus type 2 -has not taken his insulin yesterday morning.  Despite which patient's blood sugars 175 and just had a sandwich.  I have decreased patient's Lantus insulin dose from 55 units in the morning and 45 in the night to 15 twice daily with sliding scale coverage.  Closely monitor CBGs. 6. Hypothyroidism on Synthroid check TSH. 7. History of stroke with right-sided hemiparesis on  Plavix and statins. 8. Chronic diastolic CHF mention in the chart.  Appears dehydrated. 9. Thickening of the urinary bladder seen in the CAT scan.  Will follow urine culture. 10. COPD not actively wheezing. 11. History of lung cancer.  Addendum -monitor shows patient is having second-degree AV block type I Wenckebach.  Will get EKG recheck electrolytes.  Will monitor in telemetry and consult cardiology.   DVT prophylaxis: Heparin. Code Status: Full code as confirmed with patient's wife. Family Communication: Patient's wife. Disposition Plan: To be determined. Consults called: Physical therapy. Admission status: Observation.   Rise Patience MD [facility] Pager [phone].  If 7PM-7AM, please contact night-coverage [email or web address] Password [email or web address]  02/06/2019, 2:25 AM

## 2019-02-06 NOTE — Evaluation (Signed)
Physical Therapy Evaluation Patient Details Name: Brad Taylor MRN: [id number] DOB: October 22, 1950 Today's Date: 02/06/2019   History of Present Illness  68 yo male admitted with weakness, dehydration, hypokalemia, hyponatremia. Hx of CVA with R sided hemiparesis, PTSD, R lower leg chronic wound, cardiac stent 02/01/19, falls, DM  Clinical Impression  On eval, pt required Min assist for mobility. He walked ~75 feet with a RW. Discussed d/c plan-pt plans to return home where he lives with his wife. He stated wife can assist as needed. He is agreeable to HHPT f/u.     Follow Up Recommendations Home health PT;Supervision/Assistance - 24 hour    Equipment Recommendations  None recommended by PT    Recommendations for Other Services       Precautions / Restrictions Precautions Precautions: Fall Required Braces or Orthoses: (has post op shoe-pt declined to wear it on eval) Restrictions Weight Bearing Restrictions: No      Mobility  Bed Mobility Overal bed mobility: Needs Assistance Bed Mobility: Supine to Sit     Supine to sit: Min guard;HOB elevated     General bed mobility comments: for safety, lines. Increased time.  Transfers Overall transfer level: Needs assistance Equipment used: Rolling walker (2 wheeled) Transfers: Sit to/from Stand Sit to Stand: Min assist         General transfer comment: Assist to rise, stabilize, control descent. VCs safety, hand placement.  Ambulation/Gait Ambulation/Gait assistance: Min assist Gait Distance (Feet): 75 Feet Assistive device: Rolling walker (2 wheeled) Gait Pattern/deviations: Step-through pattern;Decreased stride length     General Gait Details: Intermittent assist to stabilize. Cues for safety. Pt tolerated distance well.  Stairs            Wheelchair Mobility    Modified Rankin (Stroke Patients Only)       Balance Overall balance assessment: Needs assistance         Standing balance support:  Bilateral upper extremity supported Standing balance-Leahy Scale: Poor                               Pertinent Vitals/Pain Pain Assessment: Faces Faces Pain Scale: Hurts little more Pain Location: R LE Pain Descriptors / Indicators: Sore Pain Intervention(s): Monitored during session;Repositioned    Home Living Family/patient expects to be discharged to:: Private residence Living Arrangements: Spouse/significant other Available Help at Discharge: Family;Available 24 hours/day Type of Home: House Home Access: Stairs to enter Entrance Stairs-Rails: Right Entrance Stairs-Number of Steps: 5 Home Layout: One level Home Equipment: Walker - 2 wheels;Cane - single point      Prior Function Level of Independence: Independent with assistive device(s);Needs assistance      ADL's / Homemaking Assistance Needed: wife assists with bathing, dressing, medications  Comments: using RW for ambulation     Hand Dominance        Extremity/Trunk Assessment   Upper Extremity Assessment Upper Extremity Assessment: Overall WFL for tasks assessed    Lower Extremity Assessment Lower Extremity Assessment: Generalized weakness    Cervical / Trunk Assessment Cervical / Trunk Assessment: Normal  Communication   Communication: No difficulties  Cognition Arousal/Alertness: Awake/alert Behavior During Therapy: WFL for tasks assessed/performed Overall Cognitive Status: Within Functional Limits for tasks assessed  General Comments      Exercises     Assessment/Plan    PT Assessment Patient needs continued PT services  PT Problem List Decreased strength;Decreased mobility;Decreased activity tolerance;Decreased balance;Decreased knowledge of use of DME;Pain       PT Treatment Interventions DME instruction;Gait training;Therapeutic exercise;Therapeutic activities;Patient/family education;Balance training;Functional  mobility training    PT Goals (Current goals can be found in the Care Plan section)  Acute Rehab PT Goals Patient Stated Goal: none stated PT Goal Formulation: With patient Time For Goal Achievement: 02/20/19 Potential to Achieve Goals: Good    Frequency Min 3X/week   Barriers to discharge        Co-evaluation               AM-PAC PT "6 Clicks" Mobility  Outcome Measure Help needed turning from your back to your side while in a flat bed without using bedrails?: A Little Help needed moving from lying on your back to sitting on the side of a flat bed without using bedrails?: A Little Help needed moving to and from a bed to a chair (including a wheelchair)?: A Little Help needed standing up from a chair using your arms (e.g., wheelchair or bedside chair)?: A Little Help needed to walk in hospital room?: A Little Help needed climbing 3-5 steps with a railing? : A Little 6 Click Score: 18    End of Session Equipment Utilized During Treatment: Gait belt Activity Tolerance: Patient tolerated treatment well Patient left: in chair;with call bell/phone within reach;with chair alarm set   PT Visit Diagnosis: Muscle weakness (generalized) (M62.81);Unsteadiness on feet (R26.81);History of falling (Z91.81)    Time: 1916-6060 PT Time Calculation (min) (ACUTE ONLY): 30 min   Charges:   PT Evaluation $PT Eval Moderate Complexity: 1 Mod PT Treatments $Gait Training: 8-22 mins         Weston Anna, PT Acute Rehabilitation Services Pager: [phone] Office: [phone]

## 2019-02-06 NOTE — Progress Notes (Signed)
  PROGRESS NOTE  Patient admitted earlier this morning. See H&P. Brad Taylor is a 68 y.o. male with history of stroke with right-sided hemiparesis, COPD, sleep apnea, diabetes mellitus, abdominal aortic aneurysm, peripheral vascular disease status post recent procedure done by Dr. [name] vascular surgeon on February 01, 2019 wherein patient had stent of the right external iliac artery and balloon angioplasty of the right tibioperoneal trunk has been feeling weak last few days since the procedure.  Per patient's wife patient usually ambulates but last 3 days has been feeling increasingly weak and has been having diarrhea with no vomiting abdominal pain.  CT abdomen and pelvis which showed diarrheal process and also thickening of the urinary bladder concerning for obstruction or UTI.  No some stranding around the left groin area where patient had a procedure. Patient admitted for generalized weakness with hyponatremia hypokalemia likely from diarrhea and dehydration.    GI PCR panel pending, continue enteric precautions until results available Replace potassium IV fluid Cardiology consulted overnight due to second-degree AV block type I on telemetry Hyponatremia improving slowly, continue to monitor   Dessa Phi, DO [facility] [email or web address] 02/06/2019, 11:04 AM

## 2019-02-06 NOTE — ED Notes (Signed)
ED TO INPATIENT HANDOFF REPORT  Name/Age/Gender Brad Taylor 68 y.o. male  Code Status Code Status History    Date Active Date Inactive Code Status Order ID Comments User Context   02/01/2019 0918 02/01/2019 1421 Full Code [id number]  Waynetta Sandy, MD Inpatient   09/12/2018 1737 09/14/2018 2037 Full Code [id number]  Barb Merino, MD Inpatient   12/03/2017 1138 12/06/2017 1510 Full Code [id number]  Annita Brod, MD ED   08/20/2017 1056 08/20/2017 1636 Full Code [id number]  Waynetta Sandy, MD Inpatient   09/20/2013 1658 10/08/2013 1458 Full Code [id number]  [name], PA-C Inpatient   09/20/2013 1658 09/20/2013 1658 Full Code [id number]  [name], PA-C Inpatient   09/15/2013 2204 09/20/2013 1658 Full Code [id number]  Coral Spikes, DO Inpatient   09/15/2013 2105 09/15/2013 2204 Full Code [id number]  Coral Spikes, DO ED   Advance Care Planning Activity      Home/SNF/Other Home  Chief Complaint Weakness and Diarrhea  Level of Care/Admitting Diagnosis ED Disposition    ED Disposition Condition [facility]: [facility] [[id number]]  Level of Care: Telemetry [5]  Admit to tele based on following criteria: Monitor for Ischemic changes  Covid Evaluation: N/A  Diagnosis: Dehydration [276.51.ICD-9-CM]  Admitting Physician: Rise Patience [phone]  Attending Physician: Rise Patience [phone]  PT Class (Do Not Modify): Observation [104]  PT Acc Code (Do Not Modify): Observation [10022]       Medical History Past Medical History:  Diagnosis Date  . AAA (abdominal aortic aneurysm) ([place]) 5/15   3.5x3.5 cm  . COPD (chronic obstructive pulmonary disease) ([place])   . Depression   . Diabetes mellitus without complication ([place])   . GERD (gastroesophageal reflux disease)   . Hyperlipidemia   . Hypothyroidism   . Post traumatic stress disorder   . PVD (peripheral vascular disease) ([place])   . Stroke ([place])   .  Thoracic aortic aneurysm ([place])   . Thyroid disease   . Ulcer     Allergies No Known Allergies  IV Location/Drains/Wounds Patient Lines/Drains/Airways Status   Active Line/Drains/Airways    Name:   Placement date:   Placement time:   Site:   Days:   Peripheral IV 02/05/19 Anterior;Left;Proximal Forearm   02/05/19    1905    Forearm   1   External Urinary Catheter   09/13/18    1332    -   146   Wound / Incision (Open or Dehisced) 09/20/13 Diabetic ulcer Leg Lateral;Right   09/20/13    1700    Leg   1965          Labs/Imaging Results for orders placed or performed during the hospital encounter of 02/05/19 (from the past 48 hour(s))  CBC with Differential/Platelet     Status: Abnormal   Collection Time: 02/05/19  7:40 PM  Result Value Ref Range   WBC 15.4 (H) 4.0 - 10.5 K/uL   RBC 4.70 4.22 - 5.81 MIL/uL   Hemoglobin 13.6 13.0 - 17.0 g/dL   HCT 41.0 39.0 - 52.0 %   MCV 87.2 80.0 - 100.0 fL   MCH 28.9 26.0 - 34.0 pg   MCHC 33.2 30.0 - 36.0 g/dL   RDW 13.0 11.5 - 15.5 %   Platelets 306 150 - 400 K/uL   nRBC 0.0 0.0 - 0.2 %   Neutrophils Relative % 78 %   Neutro Abs 11.9 (H) 1.7 -  7.7 K/uL   Lymphocytes Relative 13 %   Lymphs Abs 2.0 0.7 - 4.0 K/uL   Monocytes Relative 8 %   Monocytes Absolute 1.3 (H) 0.1 - 1.0 K/uL   Eosinophils Relative 0 %   Eosinophils Absolute 0.1 0.0 - 0.5 K/uL   Basophils Relative 0 %   Basophils Absolute 0.0 0.0 - 0.1 K/uL   Immature Granulocytes 1 %   Abs Immature Granulocytes 0.10 (H) 0.00 - 0.07 K/uL    Comment: Performed at [facility], [place] [address], [place], [place] [address]  Comprehensive metabolic panel     Status: Abnormal   Collection Time: 02/05/19  7:40 PM  Result Value Ref Range   Sodium 129 (L) 135 - 145 mmol/L   Potassium 3.3 (L) 3.5 - 5.1 mmol/L   Chloride 92 (L) 98 - 111 mmol/L   CO2 23 22 - 32 mmol/L   Glucose, Bld 249 (H) 70 - 99 mg/dL   BUN 12 8 - 23 mg/dL   Creatinine, Ser 0.72 0.61 - 1.24 mg/dL    Calcium 8.8 (L) 8.9 - 10.3 mg/dL   Total Protein 7.4 6.5 - 8.1 g/dL   Albumin 3.3 (L) 3.5 - 5.0 g/dL   AST 16 15 - 41 U/L   ALT 17 0 - 44 U/L   Alkaline Phosphatase 153 (H) 38 - 126 U/L   Total Bilirubin 0.5 0.3 - 1.2 mg/dL   GFR calc non Af Amer >60 >60 mL/min   GFR calc Af Amer >60 >60 mL/min   Anion gap 14 5 - 15    Comment: Performed at [facility] [place] [facility], [place] [address], [place], [place] [address]  Urinalysis, Routine w reflex microscopic     Status: Abnormal   Collection Time: 02/05/19  7:40 PM  Result Value Ref Range   Color, Urine YELLOW YELLOW   APPearance CLEAR CLEAR   Specific Gravity, Urine 1.025 1.005 - 1.030   pH 6.0 5.0 - 8.0   Glucose, UA 50 (A) NEGATIVE mg/dL   Hgb urine dipstick NEGATIVE NEGATIVE   Bilirubin Urine NEGATIVE NEGATIVE   Ketones, ur 20 (A) NEGATIVE mg/dL   Protein, ur 30 (A) NEGATIVE mg/dL   Nitrite NEGATIVE NEGATIVE   Leukocytes,Ua NEGATIVE NEGATIVE   RBC / HPF 0-5 0 - 5 RBC/hpf   WBC, UA 0-5 0 - 5 WBC/hpf   Bacteria, UA RARE (A) NONE SEEN   Squamous Epithelial / LPF 0-5 0 - 5   Mucus PRESENT     Comment: Performed at [facility], [place] [address] [place], [place] [address]  SARS Coronavirus 2 (CEPHEID- Performed in [place] hospital lab), Hosp Order     Status: None   Collection Time: 02/05/19  7:43 PM   Specimen: Nasopharyngeal Swab  Result Value Ref Range   SARS Coronavirus 2 NEGATIVE NEGATIVE    Comment: (NOTE) If result is NEGATIVE SARS-CoV-2 target nucleic acids are NOT DETECTED. The SARS-CoV-2 RNA is generally detectable in upper and lower  respiratory specimens during the acute phase of infection. The lowest  concentration of SARS-CoV-2 viral copies this assay can detect is 250  copies / mL. A negative result does not preclude SARS-CoV-2 infection  and should not be used as the sole basis for treatment or other  patient management decisions.  A negative result may occur with  improper specimen  collection / handling, submission of specimen other  than nasopharyngeal swab, presence of viral mutation(s) within the  areas  targeted by this assay, and inadequate number of viral copies  (<250 copies / mL). A negative result must be combined with clinical  observations, patient history, and epidemiological information. If result is POSITIVE SARS-CoV-2 target nucleic acids are DETECTED. The SARS-CoV-2 RNA is generally detectable in upper and lower  respiratory specimens dur ing the acute phase of infection.  Positive  results are indicative of active infection with SARS-CoV-2.  Clinical  correlation with patient history and other diagnostic information is  necessary to determine patient infection status.  Positive results do  not rule out bacterial infection or co-infection with other viruses. If result is PRESUMPTIVE POSTIVE SARS-CoV-2 nucleic acids MAY BE PRESENT.   A presumptive positive result was obtained on the submitted specimen  and confirmed on repeat testing.  While 2019 novel coronavirus  (SARS-CoV-2) nucleic acids may be present in the submitted sample  additional confirmatory testing may be necessary for epidemiological  and / or clinical management purposes  to differentiate between  SARS-CoV-2 and other Sarbecovirus currently known to infect humans.  If clinically indicated additional testing with an alternate test  methodology [phone]) is advised. The SARS-CoV-2 RNA is generally  detectable in upper and lower respiratory sp ecimens during the acute  phase of infection. The expected result is Negative. Fact Sheet for Patients:  StrictlyIdeas.no Fact Sheet for Healthcare Providers: BankingDealers.co.za This test is not yet approved or cleared by the [place] FDA and has been authorized for detection and/or diagnosis of SARS-CoV-2 by FDA under an Emergency Use Authorization (EUA).  This EUA will remain in effect  (meaning this test can be used) for the duration of the COVID-19 declaration under Section 564(b)(1) of the Act, 21 U.S.C. section 360bbb-3(b)(1), unless the authorization is terminated or revoked sooner. Performed at [facility], [place] [address] [place], [place] [address]   Lactic acid, plasma     Status: None   Collection Time: 02/05/19  8:20 PM  Result Value Ref Range   Lactic Acid, Venous 1.2 0.5 - 1.9 mmol/L    Comment: Performed at [facility], [place] [address], [place], [place] [address]   Ct Abdomen Pelvis W Contrast  Result Date: 02/05/2019 CLINICAL DATA:  Abdominal distension and weakness. Post recent abdominal aortogram. EXAM: CT ABDOMEN AND PELVIS WITH CONTRAST TECHNIQUE: Multidetector CT imaging of the abdomen and pelvis was performed using the standard protocol following bolus administration of intravenous contrast. CONTRAST:  112mL OMNIPAQUE IOHEXOL 300 MG/ML  SOLN COMPARISON:  CT 09/12/2018 FINDINGS: Lower chest: Postsurgical change in the right hemithorax, partially included. Right pericardial fluid grossly unchanged. There are coronary artery calcifications. Hepatobiliary: No focal abnormality. Postcholecystectomy without biliary dilatation. Pancreas: No ductal dilatation or inflammation. Spleen: Normal in size without focal abnormality. Adrenals/Urinary Tract: No adrenal nodule. Symmetric bilateral perinephric edema. Multiple bilateral renal cysts. No hydronephrosis. Symmetric excretion on delayed phase imaging. Urinary bladder is nondistended but thick walled. Stomach/Bowel: Stomach is nondistended. No small bowel inflammation or obstruction. Liquid stool in the cecum and ascending colon, no associated colonic wall thickening. Redundant sigmoid colon with moderate colonic stool. No significant colonic inflammation. Normal appendix. Vascular/Lymphatic: Irregular calcified and noncalcified atheromatous plaque throughout the abdominal aorta.  Aneurysmal dilatation of the infrarenal portion 3.4 cm, previously 3.3 cm. No significant periaortic stranding. Left common iliac artery aneurysm at 2.7 cm, previously 2.6. Right common iliac artery aneurysm at 2.7 cm, unchanged. Stranding in the left groin in the region of vascular access with minimal soft tissue density, no pseudoaneurysm  or dominant hematoma. Prominent bilateral external iliac nodes. Reproductive: Prominent prostate gland spans 5.1 cm. Other: No significant ascites or free fluid. No free air. Musculoskeletal: L1 compression fracture has progressed from February 2020 abdominal CT with increased loss of height and superior endplate fragmentation. Associated vacuum phenomena and T12-L1 disc space. IMPRESSION: 1. Liquid stool in the cecum and ascending colon without colonic inflammation or colonic wall thickening. Findings can be seen with diarrheal illness. 2. Bladder wall thickening, likely secondary to chronic bladder outlet obstruction, recommend correlation with urinalysis to exclude urinary tract infection. 3. Soft tissue stranding in the left groin likely related to recent vascular access, no evidence of pseudoaneurysm or dominant hematoma. 4. Bilateral common iliac artery aneurysms, 2.7 cm on the left and 2.7 cm on the right. Infrarenal aortic aneurysm at 3.4 cm with advanced heterogeneous calcified and noncalcified atheromatous plaque. 5. Moderate L1 compression fracture with progressive loss of height since February. Aortic Atherosclerosis (ICD10-I70.0). Electronically Signed   By: Keith Rake M.D.   On: 02/05/2019 23:41   Dg Abd Acute 2+v W 1v Chest  Result Date: 02/05/2019 CLINICAL DATA:  Procedure on left groin 2 days ago. Generalized weakness and diarrhea. EXAM: DG ABDOMEN ACUTE W/ 1V CHEST COMPARISON:  CT scan September 12, 2018 FINDINGS: The patient is status post right upper lobectomy. Changes in the right hilum and volume loss on the right are consistent with previous surgery.  Today's x-ray is similar in appearance compared to the scout film from September 12, 2018. No pneumothorax. No change in the cardiomediastinal silhouette. No acute infiltrate. Elevation the right hemidiaphragm remains. No free air, portal venous gas, or pneumatosis. No bowel dilatation to suggest obstruction. IMPRESSION: No acute abnormalities noted. Postoperative changes in the right chest. Electronically Signed   By: Dorise Bullion III M.D   On: 02/05/2019 20:24    Pending Labs Unresulted Labs (From admission, onward)   None      Vitals/Pain Today's Vitals   02/05/19 2200 02/05/19 2230 02/05/19 2330 02/06/19 0030  BP: (!) 147/68 120/61 (!) 152/91 140/70  Pulse: 73 71 77 69  Resp:  17  17  Temp:      TempSrc:      SpO2: 92% 92% 97% 95%  Weight:      Height:        Isolation Precautions No active isolations  Medications Medications  sodium chloride (PF) 0.9 % injection (  Not Given 02/05/19 2348)  0.9 % NaCl with KCl 20 mEq/ L  infusion (has no administration in time range)  sodium chloride 0.9 % bolus 1,000 mL (0 mLs Intravenous Stopped 02/05/19 2243)  hydrOXYzine (ATARAX/VISTARIL) tablet 25 mg (25 mg Oral Given 02/05/19 1949)  lidocaine (XYLOCAINE) 2 % jelly 1 application (1 application Other Given by Other 02/05/19 2243)  iohexol (OMNIPAQUE) 300 MG/ML solution 100 mL (100 mLs Intravenous Contrast Given 02/05/19 2259)  potassium chloride SA (K-DUR) CR tablet 20 mEq (20 mEq Oral Given 02/06/19 0055)    Mobility walks

## 2019-02-06 NOTE — ED Provider Notes (Signed)
Care assumed from Dr. Roderic Palau at shift change.  Patient awaiting results of a CT scan of his abdomen.  Patient began with abdominal cramping and diarrhea earlier today.  He is 3 days status post angioplasty of his lower extremity by Dr. [name].    CT scan result shows no evidence for surgical process, but does show findings consistent with a diarrheal process.  Patient will be admitted to the hospitalist service for hydration and observation.   Veryl Speak, MD 02/06/19 [phone]

## 2019-02-06 NOTE — Progress Notes (Signed)
Pt refused CPAP use at night. Pt stated he does not use CPAP at home and does not want to use while in the hospital. RT will continue to monitor.

## 2019-02-07 DIAGNOSIS — R197 Diarrhea, unspecified: Secondary | ICD-10-CM

## 2019-02-07 DIAGNOSIS — E876 Hypokalemia: Secondary | ICD-10-CM

## 2019-02-07 LAB — CBC
HCT: 39.1 % (ref 39.0–52.0)
Hemoglobin: 13 g/dL (ref 13.0–17.0)
MCH: 29.5 pg (ref 26.0–34.0)
MCHC: 33.2 g/dL (ref 30.0–36.0)
MCV: 88.9 fL (ref 80.0–100.0)
Platelets: 284 10*3/uL (ref 150–400)
RBC: 4.4 MIL/uL (ref 4.22–5.81)
RDW: 13.1 % (ref 11.5–15.5)
WBC: 10.1 10*3/uL (ref 4.0–10.5)
nRBC: 0 % (ref 0.0–0.2)

## 2019-02-07 LAB — BASIC METABOLIC PANEL
Anion gap: 9 (ref 5–15)
BUN: 8 mg/dL (ref 8–23)
CO2: 26 mmol/L (ref 22–32)
Calcium: 8.8 mg/dL — ABNORMAL LOW (ref 8.9–10.3)
Chloride: 98 mmol/L (ref 98–111)
Creatinine, Ser: 0.6 mg/dL — ABNORMAL LOW (ref 0.61–1.24)
GFR calc Af Amer: >60 mL/min (ref >60)
GFR calc non Af Amer: >60 mL/min (ref >60)
Glucose, Bld: 140 mg/dL — ABNORMAL HIGH (ref 70–99)
Potassium: 3.4 mmol/L — ABNORMAL LOW (ref 3.5–5.1)
Sodium: 133 mmol/L — ABNORMAL LOW (ref 135–145)

## 2019-02-07 LAB — GLUCOSE, CAPILLARY: Glucose-Capillary: 171 mg/dL — ABNORMAL HIGH (ref 70–99)

## 2019-02-07 MED ORDER — POTASSIUM CHLORIDE CRYS ER 20 MEQ PO TBCR: 20.0000 meq | EXTENDED_RELEASE_TABLET | Freq: Once | ORAL | Status: DC

## 2019-02-07 MED ORDER — POTASSIUM CHLORIDE CRYS ER 20 MEQ PO TBCR
40.0000 meq | EXTENDED_RELEASE_TABLET | Freq: Once | ORAL | Status: AC
  Administered 2019-02-07: 08:00:00 40 meq via ORAL
  Filled 2019-02-07: qty 2

## 2019-02-07 NOTE — Progress Notes (Signed)
Pt. declined CPAP placement, made aware to notify if wanting.

## 2019-02-07 NOTE — Discharge Summary (Signed)
Physician Discharge Summary  [name] QMG:[id number] DOB: [birth year] DOA: 02/05/2019  PCP: [name], MD  Admit date: 02/05/2019 Discharge date: 02/07/2019  Admitted From: Home Disposition:  Home  Recommendations for Outpatient Follow-up:  1. Follow up with PCP in 1 week 2. Please obtain BMP/Mg in 1 week   Discharge Condition: Stable CODE STATUS: Full  Diet recommendation: Carb modified   Brief/Interim Summary: Brad Taylor a 68 y.o.Brad Taylor of stroke with right-sided hemiparesis, COPD, sleep apnea, diabetes mellitus, abdominal aortic aneurysm, peripheral vascular disease status post recent procedure done by Dr.Cainvascular surgeon on February 01, 2019 wherein patient had stent of the right external iliac artery and balloon angioplasty of the right tibioperoneal trunk has been feeling weak last few days since the procedure. Per patient's wife patient usually ambulates but last 3 days has been feeling increasingly weak and has been having diarrhea with no vomiting abdominal pain. CT abdomen and pelvis which showed diarrheal process and also thickening of the urinary bladder concerning for obstruction or UTI. No some stranding around the left groin area where patient had a procedure. Patient admitted for generalized weakness with hyponatremia hypokalemia likely from diarrhea and dehydration.   GI PCR negative. Diarrhea resolved. Due to concern for Wenckebach,  Cardiology consulted.  It was felt that this was due to electrolyte abnormalities.  Patient's potassium was replaced.  Hyponatremia improving after IVF. Wenckebach resolved on telemetry.  Echocardiogram was also completed during his hospitalization, results as below.  On day of discharge, patient was anxious to go home, no further episodes or complaints of severe diarrhea.  Denied any shortness of breath or chest pain.  Patient was on room air and did not require any home oxygenation on discharge.  Discussed with  wife prior to discharge as well and all questions and concerns addressed.  Discharge Diagnoses:  Principal Problem:   Diarrhea Active Problems:   Uncontrolled type 2 diabetes mellitus with diabetic neuropathy, with long-term current use of insulin (HCC)   Hemiplegia and hemiparesis following unspecified cerebrovascular disease affecting unspecified side (HCC)   Hypothyroidism   Chronic diastolic CHF (congestive heart failure) (HCC)   H/O: lung cancer   History of stroke   PVD (peripheral vascular disease) (HCC)   Dehydration   Generalized weakness   Hyponatremia   Wenckebach second degree AV block   Hypokalemia   Discharge Instructions  Discharge Instructions    Call MD for:   Complete by: As directed    Worsening diarrhea   Call MD for:  difficulty breathing, headache or visual disturbances   Complete by: As directed    Call MD for:  extreme fatigue   Complete by: As directed    Call MD for:  hives   Complete by: As directed    Call MD for:  persistant dizziness or light-headedness   Complete by: As directed    Call MD for:  persistant nausea and vomiting   Complete by: As directed    Call MD for:  severe uncontrolled pain   Complete by: As directed    Call MD for:  temperature >100.4   Complete by: As directed    Diet - low sodium heart healthy   Complete by: As directed    Discharge instructions   Complete by: As directed    You were cared for by a hospitalist during your hospital stay. If you have any questions about your discharge medications or the care you received while you were in the hospital after  you are discharged, you can call the unit and ask to speak with the hospitalist on call if the hospitalist that took care of you is not available. Once you are discharged, your primary care physician will handle any further medical issues. Please note that NO REFILLS for any discharge medications will be authorized once you are discharged, as it is imperative that you  return to your primary care physician (or establish a relationship with a primary care physician if you do not have one) for your aftercare needs so that they can reassess your need for medications and monitor your lab values.   Increase activity slowly   Complete by: As directed      Allergies as of 02/07/2019   No Known Allergies     Medication List    STOP taking these medications   amphetamine-dextroamphetamine 15 MG 24 hr capsule Commonly known as: Adderall XR   cloNIDine 0.1 mg/24hr patch Commonly known as: CATAPRES - Dosed in mg/24 hr   doxycycline 100 MG capsule Commonly known as: VIBRAMYCIN   metroNIDAZOLE 500 MG tablet Commonly known as: FLAGYL     TAKE these medications   albuterol 108 (90 Base) MCG/ACT inhaler Commonly known as: VENTOLIN HFA INHALE 2 PUFFS INTO THE LUNGS EVERY 6 HOURS AS NEEDED FOR WHEEZE OR SHORTNESS OF BREATH What changed:   how much to take  how to take this  when to take this  reasons to take this  additional instructions   busPIRone 10 MG tablet Commonly known as: BUSPAR Take 0.5 tablets (5 mg total) by mouth 2 (two) times daily as needed (for anxiety). What changed:   how much to take  when to take this   clopidogrel 75 MG tablet Commonly known as: PLAVIX Take 1 tablet (75 mg total) by mouth daily with breakfast.   fluticasone 50 MCG/ACT nasal spray Commonly known as: FLONASE Place 2 sprays into both nostrils daily. What changed:   when to take this  reasons to take this   Fluticasone-Umeclidin-Vilant 100-62.5-25 MCG/INH Aepb Commonly known as: Trelegy Ellipta Inhale 1 Inhaler into the lungs daily.   gabapentin 300 MG capsule Commonly known as: NEURONTIN Take 1 capsule (300 mg total) by mouth 3 (three) times daily.   insulin glargine 100 UNIT/ML injection Commonly known as: LANTUS Inject 45-55 Units into the skin See admin instructions. Take 55 units in the morning and 45 units at night   levothyroxine 125 MCG  tablet Commonly known as: SYNTHROID Take 1 tablet (125 mcg total) by mouth daily before breakfast.   meclizine 25 MG tablet Commonly known as: ANTIVERT Take 1 tablet (25 mg total) by mouth 3 (three) times daily as needed for dizziness (vertigo).   metFORMIN 500 MG tablet Commonly known as: GLUCOPHAGE Take 500 mg by mouth 2 (two) times daily with a meal.   Morphine Sulfate ER 30 MG T12a Take 30 mg by mouth every 12 (twelve) hours as needed for pain.   Movantik 25 MG Tabs tablet Generic drug: naloxegol oxalate TAKE 1 TABLET BY MOUTH EVERY DAY What changed: how much to take   RABEprazole 20 MG tablet Commonly known as: ACIPHEX Take 2 tablets (40 mg total) by mouth daily before breakfast.   rosuvastatin 10 MG tablet Commonly known as: CRESTOR Take 5 mg by mouth daily.   tamsulosin 0.4 MG Caps capsule Commonly known as: FLOMAX Take 0.4 mg by mouth at bedtime.   Tiotropium Bromide-Olodaterol 2.5-2.5 MCG/ACT Aers Commonly known as: Stiolto Respimat Inhale 2  Inhalers into the lungs daily.   traZODone 100 MG tablet Commonly known as: DESYREL Take 3 tablets (300 mg total) by mouth at bedtime.   triamcinolone cream 0.1 % Commonly known as: KENALOG Apply 1 application topically 2 (two) times daily as needed (rash).   venlafaxine XR 150 MG 24 hr capsule Commonly known as: EFFEXOR-XR Take 1 capsule (150 mg total) by mouth daily with breakfast. What changed: when to take this      Follow-up Information    [name], MD. Schedule an appointment as soon as possible for a visit in 1 week(s).   Specialty: Family Medicine Why: Recheck potassium and magnesium levels Contact information: [address] [place] [address] [phone]          No Known Allergies  Consultations:  Cardiology    Procedures/Studies: Ct Head Wo Contrast  Result Date: 02/06/2019 CLINICAL DATA:  68 y/o M; Altered level of consciousness (LOC), unexplained. EXAM: CT HEAD  WITHOUT CONTRAST TECHNIQUE: Contiguous axial images were obtained from the base of the skull through the vertex without intravenous contrast. COMPARISON:  09/12/2018 CT head.  09/05/2018 MRI head. FINDINGS: Brain: No evidence of acute infarction, hemorrhage, hydrocephalus, extra-axial collection or mass lesion/mass effect. Stable large confluent white matter hypodensities compatible with advanced chronic microvascular ischemic changes. Stable volume loss of the brain. Multiple stable small chronic infarctions within the left basal ganglia and posterior limb of internal capsule. Vascular: Calcific atherosclerosis of the internal carotid arteries. No hyperdense vessel identified. Skull: Normal. Negative for fracture or focal lesion. Sinuses/Orbits: Partial opacification of left posterior ethmoid air cells and mucosal thickening in the left maxillary sinus. Normal aeration of occluded mastoid air cells. Bilateral intra-ocular lens replacement. Other: None. IMPRESSION: 1. No acute intracranial abnormality identified. 2. Stable advanced chronic microvascular ischemic, volume loss, and multiple small chronic infarctions in the left basal ganglia/posterior limb of internal capsule. Electronically Signed   By: Kristine Garbe M.D.   On: 02/06/2019 03:23   Ct Abdomen Pelvis W Contrast  Result Date: 02/05/2019 CLINICAL DATA:  Abdominal distension and weakness. Post recent abdominal aortogram. EXAM: CT ABDOMEN AND PELVIS WITH CONTRAST TECHNIQUE: Multidetector CT imaging of the abdomen and pelvis was performed using the standard protocol following bolus administration of intravenous contrast. CONTRAST:  154mL OMNIPAQUE IOHEXOL 300 MG/ML  SOLN COMPARISON:  CT 09/12/2018 FINDINGS: Lower chest: Postsurgical change in the right hemithorax, partially included. Right pericardial fluid grossly unchanged. There are coronary artery calcifications. Hepatobiliary: No focal abnormality. Postcholecystectomy without biliary  dilatation. Pancreas: No ductal dilatation or inflammation. Spleen: Normal in size without focal abnormality. Adrenals/Urinary Tract: No adrenal nodule. Symmetric bilateral perinephric edema. Multiple bilateral renal cysts. No hydronephrosis. Symmetric excretion on delayed phase imaging. Urinary bladder is nondistended but thick walled. Stomach/Bowel: Stomach is nondistended. No small bowel inflammation or obstruction. Liquid stool in the cecum and ascending colon, no associated colonic wall thickening. Redundant sigmoid colon with moderate colonic stool. No significant colonic inflammation. Normal appendix. Vascular/Lymphatic: Irregular calcified and noncalcified atheromatous plaque throughout the abdominal aorta. Aneurysmal dilatation of the infrarenal portion 3.4 cm, previously 3.3 cm. No significant periaortic stranding. Left common iliac artery aneurysm at 2.7 cm, previously 2.6. Right common iliac artery aneurysm at 2.7 cm, unchanged. Stranding in the left groin in the region of vascular access with minimal soft tissue density, no pseudoaneurysm or dominant hematoma. Prominent bilateral external iliac nodes. Reproductive: Prominent prostate gland spans 5.1 cm. Other: No significant ascites or free fluid. No free air.  Musculoskeletal: L1 compression fracture has progressed from February 2020 abdominal CT with increased loss of height and superior endplate fragmentation. Associated vacuum phenomena and T12-L1 disc space. IMPRESSION: 1. Liquid stool in the cecum and ascending colon without colonic inflammation or colonic wall thickening. Findings can be seen with diarrheal illness. 2. Bladder wall thickening, likely secondary to chronic bladder outlet obstruction, recommend correlation with urinalysis to exclude urinary tract infection. 3. Soft tissue stranding in the left groin likely related to recent vascular access, no evidence of pseudoaneurysm or dominant hematoma. 4. Bilateral common iliac artery  aneurysms, 2.7 cm on the left and 2.7 cm on the right. Infrarenal aortic aneurysm at 3.4 cm with advanced heterogeneous calcified and noncalcified atheromatous plaque. 5. Moderate L1 compression fracture with progressive loss of height since February. Aortic Atherosclerosis (ICD10-I70.0). Electronically Signed   By: Keith Rake M.D.   On: 02/05/2019 23:41   Dg Chest Port 1 View  Result Date: 02/06/2019 CLINICAL DATA:  Hypoxemic respiratory failure. Stroke. COPD. Diabetes. EXAM: PORTABLE CHEST 1 VIEW COMPARISON:  09/12/2018 FINDINGS: Midline trachea. Moderate to marked cardiomegaly. Atherosclerosis in the transverse aorta. Moderate right hemidiaphragm elevation. No definite pleural fluid. No pneumothorax. Low lung volumes with resultant pulmonary interstitial prominence. Right infrahilar volume loss and probable atelectasis, improved. No well-defined new lobar consolidation. No overt congestive failure. Mild limitations secondary to patient size and AP portable technique. IMPRESSION: Cardiomegaly and low lung volumes, without congestive failure. Right hemidiaphragm elevation with improved right infrahilar atelectasis. Aortic Atherosclerosis (ICD10-I70.0). Electronically Signed   By: Abigail Miyamoto M.D.   On: 02/06/2019 16:34   Dg Abd Acute 2+v W 1v Chest  Result Date: 02/05/2019 CLINICAL DATA:  Procedure on left groin 2 days ago. Generalized weakness and diarrhea. EXAM: DG ABDOMEN ACUTE W/ 1V CHEST COMPARISON:  CT scan September 12, 2018 FINDINGS: The patient is status post right upper lobectomy. Changes in the right hilum and volume loss on the right are consistent with previous surgery. Today's x-ray is similar in appearance compared to the scout film from September 12, 2018. No pneumothorax. No change in the cardiomediastinal silhouette. No acute infiltrate. Elevation the right hemidiaphragm remains. No free air, portal venous gas, or pneumatosis. No bowel dilatation to suggest obstruction. IMPRESSION: No  acute abnormalities noted. Postoperative changes in the right chest. Electronically Signed   By: Dorise Bullion III M.D   On: 02/05/2019 20:24    Echocardiogram   IMPRESSIONS    1. The left ventricle has normal systolic function, with an ejection fraction of 55-60%. The cavity size was normal. There is moderately increased left ventricular wall thickness. Left ventricular diastolic Doppler parameters are consistent with  impaired relaxation.  2. The mitral valve is grossly normal. There is mild mitral annular calcification present.  3. The tricuspid valve is grossly normal.  4. The aortic valve is tricuspid. Moderate calcification of the aortic valve. Aortic valve regurgitation is trivial by color flow Doppler. No stenosis of the aortic valve.  5. Normal LV function; mild diastolic dysfunction; moderate LVH; sclerotic aortic valve with trace AI; trace MR.   Discharge Exam: Vitals:   02/07/19 0404 02/07/19 0804  BP: 132/71   Pulse: (!) 53   Resp: 18   Temp: 99.8 F (37.7 C)   SpO2: 96% 98%     General: Pt is alert, awake, not in acute distress Cardiovascular: RRR, S1/S2 +, no rubs, no gallops Respiratory: CTA bilaterally, no wheezing, no rhonchi Abdominal: Soft, NT, ND, bowel sounds + Extremities: no edema,  no cyanosis    The results of significant diagnostics from this hospitalization (including imaging, microbiology, ancillary and laboratory) are listed below for reference.     Microbiology: Recent Results (from the past 240 hour(s))  SARS Coronavirus 2 (Performed in [place] hospital lab)     Status: None   Collection Time: 01/29/19  2:45 PM   Specimen: Nasal Swab  Result Value Ref Range Status   SARS Coronavirus 2 NEGATIVE NEGATIVE Final    Comment: (NOTE) SARS-CoV-2 target nucleic acids are NOT DETECTED. The SARS-CoV-2 RNA is generally detectable in upper and lower respiratory specimens during the acute phase of infection. Negative results do not preclude  SARS-CoV-2 infection, do not rule out co-infections with other pathogens, and should not be used as the sole basis for treatment or other patient management decisions. Negative results must be combined with clinical observations, patient history, and epidemiological information. The expected result is Negative. Fact Sheet for Patients: SugarRoll.be Fact Sheet for Healthcare Providers: https://www.woods-mathews.com/ This test is not yet approved or cleared by the [place] FDA and  has been authorized for detection and/or diagnosis of SARS-CoV-2 by FDA under an Emergency Use Authorization (EUA). This EUA will remain  in effect (meaning this test can be used) for the duration of the COVID-19 declaration under Section 56 4(b)(1) of the Act, 21 U.S.C. section 360bbb-3(b)(1), unless the authorization is terminated or revoked sooner. Performed at [facility], [place] [address] [place], [place] [address]   SARS Coronavirus 2 (CEPHEID- Performed in [place] hospital lab), Hosp Order     Status: None   Collection Time: 02/05/19  7:43 PM   Specimen: Nasopharyngeal Swab  Result Value Ref Range Status   SARS Coronavirus 2 NEGATIVE NEGATIVE Final    Comment: (NOTE) If result is NEGATIVE SARS-CoV-2 target nucleic acids are NOT DETECTED. The SARS-CoV-2 RNA is generally detectable in upper and lower  respiratory specimens during the acute phase of infection. The lowest  concentration of SARS-CoV-2 viral copies this assay can detect is 250  copies / mL. A negative result does not preclude SARS-CoV-2 infection  and should not be used as the sole basis for treatment or other  patient management decisions.  A negative result may occur with  improper specimen collection / handling, submission of specimen other  than nasopharyngeal swab, presence of viral mutation(s) within the  areas targeted by this assay, and inadequate number of viral copies   (<250 copies / mL). A negative result must be combined with clinical  observations, patient history, and epidemiological information. If result is POSITIVE SARS-CoV-2 target nucleic acids are DETECTED. The SARS-CoV-2 RNA is generally detectable in upper and lower  respiratory specimens dur ing the acute phase of infection.  Positive  results are indicative of active infection with SARS-CoV-2.  Clinical  correlation with patient history and other diagnostic information is  necessary to determine patient infection status.  Positive results do  not rule out bacterial infection or co-infection with other viruses. If result is PRESUMPTIVE POSTIVE SARS-CoV-2 nucleic acids MAY BE PRESENT.   A presumptive positive result was obtained on the submitted specimen  and confirmed on repeat testing.  While 2019 novel coronavirus  (SARS-CoV-2) nucleic acids may be present in the submitted sample  additional confirmatory testing may be necessary for epidemiological  and / or clinical management purposes  to differentiate between  SARS-CoV-2 and other Sarbecovirus currently known to infect humans.  If clinically indicated additional testing with an alternate test  methodology [phone]) is advised. The SARS-CoV-2 RNA is generally  detectable in upper and lower respiratory sp ecimens during the acute  phase of infection. The expected result is Negative. Fact Sheet for Patients:  StrictlyIdeas.no Fact Sheet for Healthcare Providers: BankingDealers.co.za This test is not yet approved or cleared by the [place] FDA and has been authorized for detection and/or diagnosis of SARS-CoV-2 by FDA under an Emergency Use Authorization (EUA).  This EUA will remain in effect (meaning this test can be used) for the duration of the COVID-19 declaration under Section 564(b)(1) of the Act, 21 U.S.C. section 360bbb-3(b)(1), unless the authorization is terminated  or revoked sooner. Performed at [facility], [place] [address] [place], [place] [address]   Gastrointestinal Panel by PCR , Stool     Status: None   Collection Time: 02/06/19  4:35 AM   Specimen: Stool  Result Value Ref Range Status   Campylobacter species NOT DETECTED NOT DETECTED Final   Plesimonas shigelloides NOT DETECTED NOT DETECTED Final   Salmonella species NOT DETECTED NOT DETECTED Final   Yersinia enterocolitica NOT DETECTED NOT DETECTED Final   Vibrio species NOT DETECTED NOT DETECTED Final   Vibrio cholerae NOT DETECTED NOT DETECTED Final   Enteroaggregative E coli (EAEC) NOT DETECTED NOT DETECTED Final   Enteropathogenic E coli (EPEC) NOT DETECTED NOT DETECTED Final   Enterotoxigenic E coli (ETEC) NOT DETECTED NOT DETECTED Final   Shiga like toxin producing E coli (STEC) NOT DETECTED NOT DETECTED Final   Shigella/Enteroinvasive E coli (EIEC) NOT DETECTED NOT DETECTED Final   Cryptosporidium NOT DETECTED NOT DETECTED Final   Cyclospora cayetanensis NOT DETECTED NOT DETECTED Final   Entamoeba histolytica NOT DETECTED NOT DETECTED Final   Giardia lamblia NOT DETECTED NOT DETECTED Final   Adenovirus F40/41 NOT DETECTED NOT DETECTED Final   Astrovirus NOT DETECTED NOT DETECTED Final   Norovirus GI/GII NOT DETECTED NOT DETECTED Final   Rotavirus A NOT DETECTED NOT DETECTED Final   Sapovirus (I, II, IV, and V) NOT DETECTED NOT DETECTED Final    Comment: Performed at [facility], [address] [place], [place] [address]     Labs: BNP (last 3 results) Recent Labs    09/12/18 1221  BNP 947.0*   Basic Metabolic Panel: Recent Labs  Lab 02/01/19 0717 02/05/19 1940 02/06/19 0527 02/07/19 0529  NA 133* 129* 131* 133*  K 4.4 3.3* 3.0* 3.4*  CL 94* 92* 96* 98  CO2  --  23 25 26   GLUCOSE 148* 249* 190* 140*  BUN 11 12 9 8   CREATININE 0.70 0.72 0.66 0.60*  CALCIUM  --  8.8* 8.5* 8.8*  MG  --   --  1.9  --    Liver Function  Tests: Recent Labs  Lab 02/05/19 1940 02/06/19 0527  AST 16 13*  ALT 17 15  ALKPHOS 153* 127*  BILITOT 0.5 0.8  PROT 7.4 6.7  ALBUMIN 3.3* 3.1*   No results for input(s): LIPASE, AMYLASE in the last 168 hours. No results for input(s): AMMONIA in the last 168 hours. CBC: Recent Labs  Lab 02/01/19 0717 02/05/19 1940 02/06/19 0527 02/07/19 0529  WBC  --  15.4* 12.4* 10.1  NEUTROABS  --  11.9*  --   --   HGB 14.3 13.6 13.0 13.0  HCT 42.0 41.0 40.2 39.1  MCV  --  87.2 88.9 88.9  PLT  --  306 286 284   Cardiac Enzymes: Recent Labs  Lab 02/06/19 0527  CKTOTAL 77   BNP: Invalid input(s): POCBNP CBG: Recent Labs  Lab 02/06/19 0804 02/06/19 1207 02/06/19 1644 02/06/19 2117 02/07/19 0750  GLUCAP 173* 232* 172* 179* 171*   D-Dimer No results for input(s): DDIMER in the last 72 hours. Hgb A1c No results for input(s): HGBA1C in the last 72 hours. Lipid Profile No results for input(s): CHOL, HDL, LDLCALC, TRIG, CHOLHDL, LDLDIRECT in the last 72 hours. Thyroid function studies Recent Labs    02/06/19 0527  TSH 1.291   Anemia work up No results for input(s): VITAMINB12, FOLATE, FERRITIN, TIBC, IRON, RETICCTPCT in the last 72 hours. Urinalysis    Component Value Date/Time   COLORURINE YELLOW 02/05/2019 1940   APPEARANCEUR CLEAR 02/05/2019 1940   LABSPEC 1.025 02/05/2019 1940   PHURINE 6.0 02/05/2019 1940   GLUCOSEU 50 (A) 02/05/2019 1940   HGBUR NEGATIVE 02/05/2019 1940   BILIRUBINUR NEGATIVE 02/05/2019 1940   KETONESUR 20 (A) 02/05/2019 1940   PROTEINUR 30 (A) 02/05/2019 1940   UROBILINOGEN 1.0 09/25/2013 1009   NITRITE NEGATIVE 02/05/2019 1940   LEUKOCYTESUR NEGATIVE 02/05/2019 1940   Sepsis Labs Invalid input(s): PROCALCITONIN,  WBC,  LACTICIDVEN Microbiology Recent Results (from the past 240 hour(s))  SARS Coronavirus 2 (Performed in [place] hospital lab)     Status: None   Collection Time: 01/29/19  2:45 PM   Specimen: Nasal Swab  Result Value  Ref Range Status   SARS Coronavirus 2 NEGATIVE NEGATIVE Final    Comment: (NOTE) SARS-CoV-2 target nucleic acids are NOT DETECTED. The SARS-CoV-2 RNA is generally detectable in upper and lower respiratory specimens during the acute phase of infection. Negative results do not preclude SARS-CoV-2 infection, do not rule out co-infections with other pathogens, and should not be used as the sole basis for treatment or other patient management decisions. Negative results must be combined with clinical observations, patient history, and epidemiological information. The expected result is Negative. Fact Sheet for Patients: SugarRoll.be Fact Sheet for Healthcare Providers: https://www.woods-mathews.com/ This test is not yet approved or cleared by the [place] FDA and  has been authorized for detection and/or diagnosis of SARS-CoV-2 by FDA under an Emergency Use Authorization (EUA). This EUA will remain  in effect (meaning this test can be used) for the duration of the COVID-19 declaration under Section 56 4(b)(1) of the Act, 21 U.S.C. section 360bbb-3(b)(1), unless the authorization is terminated or revoked sooner. Performed at [facility], [place] [address] [address], [place], [place] [address]   SARS Coronavirus 2 (CEPHEID- Performed in [facility] hospital lab), Hosp Order     Status: None   Collection Time: 02/05/19  7:43 PM   Specimen: Nasopharyngeal Swab  Result Value Ref Range Status   SARS Coronavirus 2 NEGATIVE NEGATIVE Final    Comment: (NOTE) If result is NEGATIVE SARS-CoV-2 target nucleic acids are NOT DETECTED. The SARS-CoV-2 RNA is generally detectable in upper and lower  respiratory specimens during the acute phase of infection. The lowest  concentration of SARS-CoV-2 viral copies this assay can detect is 250  copies / mL. A negative result does not preclude SARS-CoV-2 infection  and should not be used as the sole basis for treatment  or other  patient management decisions.  A negative result may occur with  improper specimen collection / handling, submission of specimen other  than nasopharyngeal swab, presence of viral mutation(s) within the  areas targeted by this assay, and inadequate number of viral copies  (<250 copies / mL). A negative result must be  combined with clinical  observations, patient history, and epidemiological information. If result is POSITIVE SARS-CoV-2 target nucleic acids are DETECTED. The SARS-CoV-2 RNA is generally detectable in upper and lower  respiratory specimens dur ing the acute phase of infection.  Positive  results are indicative of active infection with SARS-CoV-2.  Clinical  correlation with patient history and other diagnostic information is  necessary to determine patient infection status.  Positive results do  not rule out bacterial infection or co-infection with other viruses. If result is PRESUMPTIVE POSTIVE SARS-CoV-2 nucleic acids MAY BE PRESENT.   A presumptive positive result was obtained on the submitted specimen  and confirmed on repeat testing.  While 2019 novel coronavirus  (SARS-CoV-2) nucleic acids may be present in the submitted sample  additional confirmatory testing may be necessary for epidemiological  and / or clinical management purposes  to differentiate between  SARS-CoV-2 and other Sarbecovirus currently known to infect humans.  If clinically indicated additional testing with an alternate test  methodology [phone]) is advised. The SARS-CoV-2 RNA is generally  detectable in upper and lower respiratory sp ecimens during the acute  phase of infection. The expected result is Negative. Fact Sheet for Patients:  StrictlyIdeas.no Fact Sheet for Healthcare Providers: BankingDealers.co.za This test is not yet approved or cleared by the [place] FDA and has been authorized for detection and/or diagnosis of  SARS-CoV-2 by FDA under an Emergency Use Authorization (EUA).  This EUA will remain in effect (meaning this test can be used) for the duration of the COVID-19 declaration under Section 564(b)(1) of the Act, 21 U.S.C. section 360bbb-3(b)(1), unless the authorization is terminated or revoked sooner. Performed at [facility], [address], [place], [place] [address]   Gastrointestinal Panel by PCR , Stool     Status: None   Collection Time: 02/06/19  4:35 AM   Specimen: Stool  Result Value Ref Range Status   Campylobacter species NOT DETECTED NOT DETECTED Final   Plesimonas shigelloides NOT DETECTED NOT DETECTED Final   Salmonella species NOT DETECTED NOT DETECTED Final   Yersinia enterocolitica NOT DETECTED NOT DETECTED Final   Vibrio species NOT DETECTED NOT DETECTED Final   Vibrio cholerae NOT DETECTED NOT DETECTED Final   Enteroaggregative E coli (EAEC) NOT DETECTED NOT DETECTED Final   Enteropathogenic E coli (EPEC) NOT DETECTED NOT DETECTED Final   Enterotoxigenic E coli (ETEC) NOT DETECTED NOT DETECTED Final   Shiga like toxin producing E coli (STEC) NOT DETECTED NOT DETECTED Final   Shigella/Enteroinvasive E coli (EIEC) NOT DETECTED NOT DETECTED Final   Cryptosporidium NOT DETECTED NOT DETECTED Final   Cyclospora cayetanensis NOT DETECTED NOT DETECTED Final   Entamoeba histolytica NOT DETECTED NOT DETECTED Final   Giardia lamblia NOT DETECTED NOT DETECTED Final   Adenovirus F40/41 NOT DETECTED NOT DETECTED Final   Astrovirus NOT DETECTED NOT DETECTED Final   Norovirus GI/GII NOT DETECTED NOT DETECTED Final   Rotavirus A NOT DETECTED NOT DETECTED Final   Sapovirus (I, II, IV, and V) NOT DETECTED NOT DETECTED Final    Comment: Performed at [facility], [address] [place], [place] [address]     Patient was seen and examined on the day of discharge and was found to be in stable condition. Time coordinating discharge: 25 minutes including  assessment and coordination of care, as well as examination of the patient.   SIGNED:  Dessa Phi, DO [facility] [email or web address] 02/07/2019, 10:41 AM

## 2019-02-07 NOTE — Progress Notes (Signed)
SATURATION QUALIFICATIONS: (This note is used to comply with regulatory documentation for home oxygen)  Patient Saturations on Room Air at Rest = 95%  Patient Saturations on Room Air while Ambulating = 92%   Please briefly explain why patient needs home oxygen: 

## 2019-02-07 NOTE — Progress Notes (Signed)
Wenckebach episodes have resolved, telemetry shows some PVCs, continue electrolytes repletion.   Ena Dawley, MD

## 2019-02-07 NOTE — Progress Notes (Signed)
    Home health agencies that serve [phone].        [place] Quality of Patient Care Rating Patient Survey Summary Rating  [facility] [phone] 3 out of 5 stars 5 out of [place] [phone] 3 out of 5 stars 4 out of [place] [phone] 3  out of 5 stars 4 out of [place] [phone]  out of 5 stars 3 out of [place] [phone] 4 out of 5 stars 4 out of [place] [phone] out of 5 stars 4 out of 5 stars  [facility] [place] [phone] 3  out of 5 stars 4 out of [place] [phone] 3 out of 5 stars 4 out of 5 stars  [facility] ([phone]) [phone] 4 out of 5 stars Not Available12  [facility] (336) [phone] 3  out of 5 stars 3 out of [place] [phone] 4  out of 5 stars 3 out of [place] [phone]  out of 5 stars 2 out of [place] number Footnote as displayed on [place]  1 This agency provides services under a federal waiver program to non-traditional, chronic long term population.  2 This agency provides services to a special needs population.  3 Not Available.  4 The number of patient episodes for this measure is too small to report.  5 This measure currently does not have data or provider has been certified/recertified for less than 6 months.  6 The national average for this measure is not provided because of state-to-state differences in data collection.  7 Medicare is not displaying rates for this measure for any home health agency, because of an issue with the data.  8 There were problems with the data and they are being corrected.  9 Zero, or very few,  patients met the survey's rules for inclusion. The scores shown, if any, reflect a very small number of surveys and may not accurately tell how an agency is doing.  10 Survey results are based on less than 12 months of data.  11 Fewer than 70 patients completed the survey. Use the scores shown, if any, with caution as the number of surveys may be too low to accurately tell how an agency is doing.  12 No survey results are available for this period.  13 Data suppressed by CMS for one or more quarters.

## 2019-02-07 NOTE — TOC Initial Note (Signed)
Transition of Care [facility]) - Initial/Assessment Note    Patient Details  Name: Brad Taylor MRN: [id number] Date of Birth: [birth year]  [facility] CM/SW Contact:    Joaquin Courts, RN Phone Number: 02/07/2019, 12:47 PM  Clinical Narrative:    [facility] [place] to resume care at d/c.                  Barriers to Discharge: No Barriers Identified   Patient Goals and CMS Choice Patient states their goals for this hospitalization and ongoing recovery are:: to go home      Expected Discharge Plan and Services           Expected Discharge Date: 02/07/19               DME Arranged: N/A DME Agency: NA       HH Arranged: RN [place] Agency: [place] ([place]) Date [place]: 02/07/19 Time [place]: 26 Representative spoke with at [place]: Ethan Arrangements/Services                       Activities of Daily Living Home Assistive Devices/Equipment: None ADL Screening (condition at time of admission) Patient's cognitive ability adequate to safely complete daily activities?: No Is the patient deaf or have difficulty hearing?: No Does the patient have difficulty seeing, even when wearing glasses/contacts?: No Does the patient have difficulty concentrating, remembering, or making decisions?: No Patient able to express need for assistance with ADLs?: Yes Does the patient have difficulty dressing or bathing?: Yes Independently performs ADLs?: No Communication: Independent Dressing (OT): Needs assistance Is this a change from baseline?: Pre-admission baseline Grooming: Needs assistance Is this a change from baseline?: Pre-admission baseline Bathing: Needs assistance Is this a change from baseline?: Pre-admission baseline Toileting: Needs assistance Is this a change from baseline?: Pre-admission baseline In/Out Bed: Needs assistance Is this a change from  baseline?: Pre-admission baseline Walks in Home: Needs assistance Is this a change from baseline?: Pre-admission baseline Does the patient have difficulty walking or climbing stairs?: Yes Weakness of Legs: Both Weakness of Arms/Hands: Both  Permission Sought/Granted                  Emotional Assessment              Admission diagnosis:  Weakness and Diarrhea Patient Active Problem List   Diagnosis Date Noted  . Diarrhea 02/07/2019  . Hypokalemia 02/07/2019  . Dehydration 02/06/2019  . Generalized weakness 02/06/2019  . Weakness 02/06/2019  . Hyponatremia 02/06/2019  . Wenckebach second degree AV block   . Thoracic aortic aneurysm ([place])   . PVD (peripheral vascular disease) ([place])   . Jerking 10/01/2018  . History of stroke 10/01/2018  . COPD with acute bronchitis ([place]) 09/12/2018  . H/O: lung cancer 09/12/2018  . Myoclonic jerking 09/12/2018  . Leg wound, right 09/12/2018  . COPD exacerbation ([place]) 09/12/2018  . Tracheal mass   . Osteomyelitis of femur ([place]) 12/03/2017  . Hypothyroidism 12/03/2017  . Chronic ulcer of left leg ([place]) 12/03/2017  . Cellulitis of left leg 12/03/2017  . Chronic diastolic CHF (congestive heart failure) ([place]) 12/03/2017  . Uncontrolled type 2 diabetes mellitus with diabetic neuropathy, with long-term current use of insulin ([place]) 07/25/2015  . Hemiplegia and hemiparesis following unspecified cerebrovascular disease affecting unspecified side ([place]) 07/25/2015  . Cellulitis, gluteal, left 01/19/2014  . Tobacco  abuse 01/19/2014  . Aphasia as late effect of stroke 11/09/2013  . CVA (cerebral infarction) 09/20/2013  . Stroke ([place]) 09/15/2013   PCP:  [name], MD Pharmacy:   [id number] - [place], [place] [address] [place] [address] Phone: [phone] Fax: [phone]     Social Determinants of Health (SDOH) Interventions    Readmission Risk  Interventions No flowsheet data found.

## 2019-02-08 DIAGNOSIS — Z794 Long term (current) use of insulin: Secondary | ICD-10-CM | POA: Diagnosis not present

## 2019-02-08 DIAGNOSIS — L97812 Non-pressure chronic ulcer of other part of right lower leg with fat layer exposed: Secondary | ICD-10-CM | POA: Diagnosis not present

## 2019-02-08 DIAGNOSIS — Z9181 History of falling: Secondary | ICD-10-CM | POA: Diagnosis not present

## 2019-02-08 DIAGNOSIS — Z9981 Dependence on supplemental oxygen: Secondary | ICD-10-CM | POA: Diagnosis not present

## 2019-02-08 DIAGNOSIS — F17219 Nicotine dependence, cigarettes, with unspecified nicotine-induced disorders: Secondary | ICD-10-CM | POA: Diagnosis not present

## 2019-02-08 DIAGNOSIS — J449 Chronic obstructive pulmonary disease, unspecified: Secondary | ICD-10-CM | POA: Diagnosis not present

## 2019-02-08 DIAGNOSIS — E11622 Type 2 diabetes mellitus with other skin ulcer: Secondary | ICD-10-CM | POA: Diagnosis not present

## 2019-02-08 DIAGNOSIS — Z85118 Personal history of other malignant neoplasm of bronchus and lung: Secondary | ICD-10-CM | POA: Diagnosis not present

## 2019-02-08 DIAGNOSIS — B965 Pseudomonas (aeruginosa) (mallei) (pseudomallei) as the cause of diseases classified elsewhere: Secondary | ICD-10-CM | POA: Diagnosis not present

## 2019-02-10 DIAGNOSIS — E11622 Type 2 diabetes mellitus with other skin ulcer: Secondary | ICD-10-CM | POA: Diagnosis not present

## 2019-02-10 DIAGNOSIS — Z85118 Personal history of other malignant neoplasm of bronchus and lung: Secondary | ICD-10-CM | POA: Diagnosis not present

## 2019-02-10 DIAGNOSIS — Z9981 Dependence on supplemental oxygen: Secondary | ICD-10-CM | POA: Diagnosis not present

## 2019-02-10 DIAGNOSIS — B965 Pseudomonas (aeruginosa) (mallei) (pseudomallei) as the cause of diseases classified elsewhere: Secondary | ICD-10-CM | POA: Diagnosis not present

## 2019-02-10 DIAGNOSIS — L97812 Non-pressure chronic ulcer of other part of right lower leg with fat layer exposed: Secondary | ICD-10-CM | POA: Diagnosis not present

## 2019-02-10 DIAGNOSIS — Z794 Long term (current) use of insulin: Secondary | ICD-10-CM | POA: Diagnosis not present

## 2019-02-10 DIAGNOSIS — Z9181 History of falling: Secondary | ICD-10-CM | POA: Diagnosis not present

## 2019-02-10 DIAGNOSIS — J449 Chronic obstructive pulmonary disease, unspecified: Secondary | ICD-10-CM | POA: Diagnosis not present

## 2019-02-10 DIAGNOSIS — F17219 Nicotine dependence, cigarettes, with unspecified nicotine-induced disorders: Secondary | ICD-10-CM | POA: Diagnosis not present

## 2019-02-11 ENCOUNTER — Other Ambulatory Visit: Payer: Self-pay

## 2019-02-11 ENCOUNTER — Encounter (INDEPENDENT_AMBULATORY_CARE_PROVIDER_SITE_OTHER): Payer: Medicare HMO | Admitting: Family Medicine

## 2019-02-12 DIAGNOSIS — J449 Chronic obstructive pulmonary disease, unspecified: Secondary | ICD-10-CM | POA: Diagnosis not present

## 2019-02-12 DIAGNOSIS — F17219 Nicotine dependence, cigarettes, with unspecified nicotine-induced disorders: Secondary | ICD-10-CM | POA: Diagnosis not present

## 2019-02-12 DIAGNOSIS — Z9181 History of falling: Secondary | ICD-10-CM | POA: Diagnosis not present

## 2019-02-12 DIAGNOSIS — Z9981 Dependence on supplemental oxygen: Secondary | ICD-10-CM | POA: Diagnosis not present

## 2019-02-12 DIAGNOSIS — L97812 Non-pressure chronic ulcer of other part of right lower leg with fat layer exposed: Secondary | ICD-10-CM | POA: Diagnosis not present

## 2019-02-12 DIAGNOSIS — B965 Pseudomonas (aeruginosa) (mallei) (pseudomallei) as the cause of diseases classified elsewhere: Secondary | ICD-10-CM | POA: Diagnosis not present

## 2019-02-12 DIAGNOSIS — E11622 Type 2 diabetes mellitus with other skin ulcer: Secondary | ICD-10-CM | POA: Diagnosis not present

## 2019-02-12 DIAGNOSIS — Z794 Long term (current) use of insulin: Secondary | ICD-10-CM | POA: Diagnosis not present

## 2019-02-12 DIAGNOSIS — Z85118 Personal history of other malignant neoplasm of bronchus and lung: Secondary | ICD-10-CM | POA: Diagnosis not present

## 2019-02-13 DIAGNOSIS — J209 Acute bronchitis, unspecified: Secondary | ICD-10-CM | POA: Diagnosis not present

## 2019-02-13 DIAGNOSIS — J441 Chronic obstructive pulmonary disease with (acute) exacerbation: Secondary | ICD-10-CM | POA: Diagnosis not present

## 2019-02-13 DIAGNOSIS — J440 Chronic obstructive pulmonary disease with acute lower respiratory infection: Secondary | ICD-10-CM | POA: Diagnosis not present

## 2019-02-15 ENCOUNTER — Telehealth: Payer: Self-pay | Admitting: Vascular Surgery

## 2019-02-15 ENCOUNTER — Ambulatory Visit: Payer: Medicare HMO | Admitting: Family Medicine

## 2019-02-15 DIAGNOSIS — Z85118 Personal history of other malignant neoplasm of bronchus and lung: Secondary | ICD-10-CM | POA: Diagnosis not present

## 2019-02-15 DIAGNOSIS — Z794 Long term (current) use of insulin: Secondary | ICD-10-CM | POA: Diagnosis not present

## 2019-02-15 DIAGNOSIS — Z9981 Dependence on supplemental oxygen: Secondary | ICD-10-CM | POA: Diagnosis not present

## 2019-02-15 DIAGNOSIS — L97812 Non-pressure chronic ulcer of other part of right lower leg with fat layer exposed: Secondary | ICD-10-CM | POA: Diagnosis not present

## 2019-02-15 DIAGNOSIS — J449 Chronic obstructive pulmonary disease, unspecified: Secondary | ICD-10-CM | POA: Diagnosis not present

## 2019-02-15 DIAGNOSIS — Z9181 History of falling: Secondary | ICD-10-CM | POA: Diagnosis not present

## 2019-02-15 DIAGNOSIS — E11622 Type 2 diabetes mellitus with other skin ulcer: Secondary | ICD-10-CM | POA: Diagnosis not present

## 2019-02-15 DIAGNOSIS — B965 Pseudomonas (aeruginosa) (mallei) (pseudomallei) as the cause of diseases classified elsewhere: Secondary | ICD-10-CM | POA: Diagnosis not present

## 2019-02-15 DIAGNOSIS — F17219 Nicotine dependence, cigarettes, with unspecified nicotine-induced disorders: Secondary | ICD-10-CM | POA: Diagnosis not present

## 2019-02-15 NOTE — Telephone Encounter (Addendum)
Brad Taylor is aware and verbalizes understanding.  She will contact [facility] as well.  Thurston Hole., LPN  ----- Message from Waynetta Sandy, MD sent at 02/15/2019  1:51 PM EDT ----- Regarding: RE: Wound Care Contact: [phone] I would think we should continue unless [facility] gives separate orders as he has mixed arterial and venous disease.   Erlene Quan ----- Message ----- From: Kaleen Mask, LPN Sent: [phone]/[id number]/[phone]   1:28 PM EDT To: Waynetta Sandy, MD Subject: Wound Care                                     Hey Dr. [name], Brad Taylor, [facility] Nurse wants to know if she should keep the UnnaBoot/Coban on patient's right leg since he now is good blood flow, or D/c and get new orders from [place].   She said she knows the patient goes to [facility] "but is trying to cover all her bases".  Please advise.  Thanks,  Thurston Hole., LPN

## 2019-02-16 ENCOUNTER — Other Ambulatory Visit: Payer: Self-pay

## 2019-02-16 ENCOUNTER — Encounter: Payer: Self-pay | Admitting: Family Medicine

## 2019-02-16 ENCOUNTER — Ambulatory Visit: Payer: Medicare HMO | Admitting: Family Medicine

## 2019-02-16 VITALS — BP 150/80 | HR 77 | Temp 98.6°F | Resp 16 | Ht 70.75 in

## 2019-02-16 DIAGNOSIS — G3281 Cerebellar ataxia in diseases classified elsewhere: Secondary | ICD-10-CM

## 2019-02-16 DIAGNOSIS — R29898 Other symptoms and signs involving the musculoskeletal system: Secondary | ICD-10-CM

## 2019-02-16 DIAGNOSIS — G253 Myoclonus: Secondary | ICD-10-CM

## 2019-02-16 DIAGNOSIS — L0231 Cutaneous abscess of buttock: Secondary | ICD-10-CM

## 2019-02-16 MED ORDER — ONDANSETRON HCL 4 MG PO TABS: 4.0000 mg | ORAL_TABLET | Freq: Three times a day (TID) | ORAL | 0 refills | Status: AC | PRN

## 2019-02-16 MED ORDER — SULFAMETHOXAZOLE-TRIMETHOPRIM 800-160 MG PO TABS: 1.0000 | ORAL_TABLET | Freq: Two times a day (BID) | ORAL | 0 refills | Status: AC

## 2019-02-16 NOTE — Progress Notes (Signed)
Subjective:    Patient ID: Brad Taylor, male    DOB: 1951/06/28, 68 y.o.   MRN: [id number]  HPI  Patient presents today with his wife.  He seems extremely weak.  He can barely stand.  She states that over the last 2 to 3 weeks he has gotten progressively worse.  Today he can barely pull himself to a standing position to get out of a chair to show me the abscess on his gluteus.  At the base of his scrotum there is a 1 cm abscess draining purulent material.  He also has an indurated erythematous painful area on his left medial gluteus adjacent as to the rectum.  It is approximately 3 cm in diameter.  It is indurated and red and warm but not fluctuant.  He appears to have cellulitis.  However his wife is concerned because she can barely care for him at home.  He can barely stand.  He can barely walk.  He has pronounced weakness in his right leg.  He has a history of right hemiparesis secondary to a chronic stroke.  The patient had an MRI of the brain in February which revealed chronic ischemic changes in the left hemisphere with wallerian degeneration in the brainstem secondary to brain damage in the cortex.  However over the last 3 weeks the weakness in his right leg has gotten progressively worse.  It is difficult to ascertain whether the patient has deconditioning on top of his right hemiparesis secondary to his chronic medical problems or whether the patient has suffered a new stroke.  Patient is also unable to control his bladder.  He is experiencing episodes of urinary incontinence/urge incontinence however I believe some of this is likely due to his inability to stand and make it to the restroom in time.  Patient has chronic aphasia He also continues to experience myoclonic jerking.  Please see his hospital admission in February.  EEG at that time was negative for any seizures.  It was thought that the myoclonic jerking was secondary to hypoxia. Past Medical History:  Diagnosis Date  . AAA  (abdominal aortic aneurysm) ([place]) 5/15   3.5x3.5 cm  . COPD (chronic obstructive pulmonary disease) ([place])   . Depression   . Diabetes mellitus without complication ([place])   . GERD (gastroesophageal reflux disease)   . Hyperlipidemia   . Hypothyroidism   . Post traumatic stress disorder   . PVD (peripheral vascular disease) ([place])   . Stroke ([place])   . Thoracic aortic aneurysm ([place])   . Thyroid disease   . Ulcer    Past Surgical History:  Procedure Laterality Date  . ABDOMINAL AORTOGRAM W/LOWER EXTREMITY N/A 08/20/2017   Procedure: ABDOMINAL AORTOGRAM W/LOWER EXTREMITY;  Surgeon: Waynetta Sandy, MD;  Location: [place] [facility];  Service: Cardiovascular;  Laterality: N/A;  . ABDOMINAL AORTOGRAM W/LOWER EXTREMITY Bilateral 02/01/2019   Procedure: ABDOMINAL AORTOGRAM W/LOWER EXTREMITY;  Surgeon: Waynetta Sandy, MD;  Location: [place] CV LAB;  Service: Cardiovascular;  Laterality: Bilateral;  . CATARACT EXTRACTION    . CHOLECYSTECTOMY    . ENDOVENOUS ABLATION SAPHENOUS VEIN W/ LASER Right 03/18/2018   endovenous laser ablation R [facility] by Ruta Hinds MD   . ENDOVENOUS ABLATION SAPHENOUS VEIN W/ LASER Right 04/08/2018   endovenous laser ablation R [facility] by Ruta Hinds MD   . [place]    . LUNG REMOVAL, PARTIAL     right, 2 [facility] ago at [facility] for [facility]  . PERIPHERAL  VASCULAR ATHERECTOMY Right 08/20/2017   Procedure: PERIPHERAL VASCULAR ATHERECTOMY;  Surgeon: Waynetta Sandy, MD;  Location: [place] CV LAB;  Service: Cardiovascular;  Laterality: Right;  posterior tibial and tibeoperoneal trunk  . PERIPHERAL VASCULAR BALLOON ANGIOPLASTY Right 08/20/2017   Procedure: PERIPHERAL VASCULAR BALLOON ANGIOPLASTY;  Surgeon: Waynetta Sandy, MD;  Location: [place] CV LAB;  Service: Cardiovascular;  Laterality: Right;  Anterior tibial  . PERIPHERAL VASCULAR BALLOON ANGIOPLASTY  02/01/2019   Procedure: PERIPHERAL VASCULAR BALLOON ANGIOPLASTY;  Surgeon:  Waynetta Sandy, MD;  Location: [place] CV LAB;  Service: Cardiovascular;;  . PERIPHERAL VASCULAR INTERVENTION  02/01/2019   Procedure: PERIPHERAL VASCULAR INTERVENTION;  Surgeon: Waynetta Sandy, MD;  Location: [facility] CV LAB;  Service: Cardiovascular;;  . VIDEO BRONCHOSCOPY Bilateral 12/30/2017   Procedure: VIDEO BRONCHOSCOPY WITHOUT FLUORO;  Surgeon: Marshell Garfinkel, MD;  Location: [place] [facility];  Service: Cardiopulmonary;  Laterality: Bilateral;   Current Outpatient Medications on File Prior to Visit  Medication Sig Dispense Refill  . albuterol (PROVENTIL HFA;VENTOLIN HFA) 108 (90 Base) MCG/ACT inhaler INHALE 2 PUFFS INTO THE LUNGS EVERY 6 HOURS AS NEEDED FOR WHEEZE OR SHORTNESS OF BREATH (Patient taking differently: Inhale 2 puffs into the lungs every 6 (six) hours as needed for wheezing or shortness of breath. ) 18 g 3  . busPIRone (BUSPAR) 10 MG tablet Take 0.5 tablets (5 mg total) by mouth 2 (two) times daily as needed (for anxiety). (Patient taking differently: Take 10 mg by mouth 2 (two) times daily. ) 60 tablet 0  . clopidogrel (PLAVIX) 75 MG tablet Take 1 tablet (75 mg total) by mouth daily with breakfast. 90 tablet 3  . fluticasone (FLONASE) 50 MCG/ACT nasal spray Place 2 sprays into both nostrils daily. (Patient taking differently: Place 2 sprays into both nostrils daily as needed for allergies. ) 16 g 6  . Fluticasone-Umeclidin-Vilant (TRELEGY ELLIPTA) 100-62.5-25 MCG/INH AEPB Inhale 1 Inhaler into the lungs daily. 1 each 11  . gabapentin (NEURONTIN) 300 MG capsule Take 1 capsule (300 mg total) by mouth 3 (three) times daily. 90 capsule 3  . insulin glargine (LANTUS) 100 UNIT/ML injection Inject 45-55 Units into the skin See admin instructions. Take 55 units in the morning and 45 units at night    . levothyroxine (SYNTHROID, LEVOTHROID) 125 MCG tablet Take 1 tablet (125 mcg total) by mouth daily before breakfast. 30 tablet 1  . meclizine (ANTIVERT) 25 MG tablet  Take 1 tablet (25 mg total) by mouth 3 (three) times daily as needed for dizziness (vertigo). 30 tablet 0  . metFORMIN (GLUCOPHAGE) 500 MG tablet Take 500 mg by mouth 2 (two) times daily with a meal.    . Morphine Sulfate ER 30 MG T12A Take 30 mg by mouth every 12 (twelve) hours as needed for pain.     Marland Kitchen MOVANTIK 25 MG TABS tablet TAKE 1 TABLET BY MOUTH EVERY DAY (Patient taking differently: Take 25 mg by mouth daily. ) 30 tablet 5  . RABEprazole (ACIPHEX) 20 MG tablet Take 2 tablets (40 mg total) by mouth daily before breakfast. 30 tablet 1  . rosuvastatin (CRESTOR) 10 MG tablet Take 5 mg by mouth daily.    . tamsulosin (FLOMAX) 0.4 MG CAPS capsule Take 0.4 mg by mouth at bedtime.     . Tiotropium Bromide-Olodaterol (STIOLTO RESPIMAT) 2.5-2.5 MCG/ACT AERS Inhale 2 Inhalers into the lungs daily. 12 g 3  . traZODone (DESYREL) 100 MG tablet Take 3 tablets (300 mg total) by mouth at bedtime.  30 tablet 0  . triamcinolone cream (KENALOG) 0.1 % Apply 1 application topically 2 (two) times daily as needed (rash).    . venlafaxine XR (EFFEXOR-XR) 150 MG 24 hr capsule Take 1 capsule (150 mg total) by mouth daily with breakfast. (Patient taking differently: Take 150 mg by mouth 2 (two) times daily. ) 30 capsule 1   No current facility-administered medications on file prior to visit.    No Known Allergies Social History   Socioeconomic History  . Marital status: Married    Spouse name: Diane  . Number of children: 1  . Years of education: HS  . Highest education level: Not on file  Occupational History  . Occupation: Retired Dealer    Comment: [employer]  Social Needs  . Financial resource strain: Not on file  . Food insecurity    Worry: Not on file    Inability: Not on file  . Transportation needs    Medical: Not on file    Non-medical: Not on file  Tobacco Use  . Smoking status: Current Every Day Smoker    Packs/day: 1.00    Years: 30.00    Pack years: 30.00    Types: Cigarettes  .  Smokeless tobacco: Former Systems developer    Quit date: 05/04/2013  . Tobacco comment: Less than 1/2 pk per day  Substance and Sexual Activity  . Alcohol use: No    Comment: Former heavy drinker, been sober for 10 years  . Drug use: No  . Sexual activity: Not on file  Lifestyle  . Physical activity    Days per week: Not on file    Minutes per session: Not on file  . Stress: Not on file  Relationships  . Social Herbalist on phone: Not on file    Gets together: Not on file    Attends religious service: Not on file    Active member of club or organization: Not on file    Attends meetings of clubs or organizations: Not on file    Relationship status: Not on file  . Intimate partner violence    Fear of current or ex partner: Not on file    Emotionally abused: Not on file    Physically abused: Not on file    Forced sexual activity: Not on file  Other Topics Concern  . Not on file  Social History Narrative   Patient lives at home with spouse.   Caffeine Use: 3-4 cups daily   Retired Dealer.     Review of Systems  All other systems reviewed and are negative.      Objective:   Physical Exam Cardiovascular:     Rate and Rhythm: Normal rate and regular rhythm.     Heart sounds: Normal heart sounds.  Pulmonary:     Effort: Pulmonary effort is normal.     Breath sounds: Wheezing present.  Genitourinary:   Musculoskeletal:     Right lower leg: No edema.     Left lower leg: No edema.  Neurological:     Mental Status: He is alert.     Cranial Nerves: Dysarthria present.     Motor: Weakness and atrophy present.     Coordination: Coordination abnormal.     Gait: Gait abnormal.   Muscle strength is 3/5 with hip flexion on the right hip extension on the right knee flexion on the right and knee extension on the right.        Assessment &  Plan:  The primary encounter diagnosis was Right leg weakness. Diagnoses of Cellulitis and abscess of buttock, Myoclonic jerking, and  Cerebellar ataxia in diseases classified elsewhere [facility]) were also pertinent to this visit. I will treat the cellulitis on his left gluteus and also at the base of his scrotum with Bactrim double strength tablets twice daily for 7 days.  Reassess in 1 week if no better.  Patient seems to have deteriorated rapidly over the last 2 to 3 months.  He is extremely weak and frail.  He has a difficult time even standing to walk.  I am not certain if the patient is experienced a new stroke or if this is simply deconditioning on top of his already present right-sided hemiplegia.  I have recommended a repeat MRI of the brain to evaluate further.  Meanwhile schedule the patient for home health physical therapy to try to improve his deconditioning and improve his balance and strength and also to facilitate his wife's ability to care for him at home.

## 2019-02-17 DIAGNOSIS — Z9181 History of falling: Secondary | ICD-10-CM | POA: Diagnosis not present

## 2019-02-17 DIAGNOSIS — J449 Chronic obstructive pulmonary disease, unspecified: Secondary | ICD-10-CM | POA: Diagnosis not present

## 2019-02-17 DIAGNOSIS — L97812 Non-pressure chronic ulcer of other part of right lower leg with fat layer exposed: Secondary | ICD-10-CM | POA: Diagnosis not present

## 2019-02-17 DIAGNOSIS — B965 Pseudomonas (aeruginosa) (mallei) (pseudomallei) as the cause of diseases classified elsewhere: Secondary | ICD-10-CM | POA: Diagnosis not present

## 2019-02-17 DIAGNOSIS — Z794 Long term (current) use of insulin: Secondary | ICD-10-CM | POA: Diagnosis not present

## 2019-02-17 DIAGNOSIS — F17219 Nicotine dependence, cigarettes, with unspecified nicotine-induced disorders: Secondary | ICD-10-CM | POA: Diagnosis not present

## 2019-02-17 DIAGNOSIS — Z9981 Dependence on supplemental oxygen: Secondary | ICD-10-CM | POA: Diagnosis not present

## 2019-02-17 DIAGNOSIS — E11622 Type 2 diabetes mellitus with other skin ulcer: Secondary | ICD-10-CM | POA: Diagnosis not present

## 2019-02-17 DIAGNOSIS — Z85118 Personal history of other malignant neoplasm of bronchus and lung: Secondary | ICD-10-CM | POA: Diagnosis not present

## 2019-02-18 ENCOUNTER — Emergency Department (HOSPITAL_COMMUNITY)
Admission: EM | Admit: 2019-02-18 | Discharge: 2019-02-19 | Disposition: A | Discharge status: Home / Self Care | Payer: No Typology Code available for payment source | Attending: Emergency Medicine | Admitting: Emergency Medicine

## 2019-02-18 ENCOUNTER — Other Ambulatory Visit: Payer: Self-pay

## 2019-02-18 ENCOUNTER — Encounter (HOSPITAL_COMMUNITY): Payer: Self-pay | Admitting: Emergency Medicine

## 2019-02-18 ENCOUNTER — Emergency Department (HOSPITAL_COMMUNITY): Payer: No Typology Code available for payment source

## 2019-02-18 DIAGNOSIS — Z8501 Personal history of malignant neoplasm of esophagus: Secondary | ICD-10-CM | POA: Insufficient documentation

## 2019-02-18 DIAGNOSIS — I5032 Chronic diastolic (congestive) heart failure: Secondary | ICD-10-CM | POA: Insufficient documentation

## 2019-02-18 DIAGNOSIS — R531 Weakness: Secondary | ICD-10-CM | POA: Diagnosis not present

## 2019-02-18 DIAGNOSIS — J449 Chronic obstructive pulmonary disease, unspecified: Secondary | ICD-10-CM | POA: Diagnosis not present

## 2019-02-18 DIAGNOSIS — I10 Essential (primary) hypertension: Secondary | ICD-10-CM | POA: Insufficient documentation

## 2019-02-18 DIAGNOSIS — I739 Peripheral vascular disease, unspecified: Secondary | ICD-10-CM | POA: Diagnosis not present

## 2019-02-18 DIAGNOSIS — W19XXXA Unspecified fall, initial encounter: Secondary | ICD-10-CM | POA: Diagnosis not present

## 2019-02-18 DIAGNOSIS — Z85118 Personal history of other malignant neoplasm of bronchus and lung: Secondary | ICD-10-CM | POA: Diagnosis not present

## 2019-02-18 DIAGNOSIS — M5489 Other dorsalgia: Secondary | ICD-10-CM | POA: Diagnosis not present

## 2019-02-18 DIAGNOSIS — W1830XA Fall on same level, unspecified, initial encounter: Secondary | ICD-10-CM | POA: Insufficient documentation

## 2019-02-18 DIAGNOSIS — E119 Type 2 diabetes mellitus without complications: Secondary | ICD-10-CM | POA: Insufficient documentation

## 2019-02-18 DIAGNOSIS — F17210 Nicotine dependence, cigarettes, uncomplicated: Secondary | ICD-10-CM | POA: Diagnosis not present

## 2019-02-18 DIAGNOSIS — M545 Low back pain: Secondary | ICD-10-CM | POA: Diagnosis not present

## 2019-02-18 DIAGNOSIS — E039 Hypothyroidism, unspecified: Secondary | ICD-10-CM | POA: Diagnosis not present

## 2019-02-18 DIAGNOSIS — Z79899 Other long term (current) drug therapy: Secondary | ICD-10-CM | POA: Insufficient documentation

## 2019-02-18 DIAGNOSIS — Z794 Long term (current) use of insulin: Secondary | ICD-10-CM | POA: Diagnosis not present

## 2019-02-18 DIAGNOSIS — R52 Pain, unspecified: Secondary | ICD-10-CM | POA: Diagnosis not present

## 2019-02-18 LAB — BASIC METABOLIC PANEL
Anion gap: 12 (ref 5–15)
BUN: 12 mg/dL (ref 8–23)
CO2: 26 mmol/L (ref 22–32)
Calcium: 8.8 mg/dL — ABNORMAL LOW (ref 8.9–10.3)
Chloride: 92 mmol/L — ABNORMAL LOW (ref 98–111)
Creatinine, Ser: 0.91 mg/dL (ref 0.61–1.24)
GFR calc Af Amer: >60 mL/min (ref >60)
GFR calc non Af Amer: >60 mL/min (ref >60)
Glucose, Bld: 148 mg/dL — ABNORMAL HIGH (ref 70–99)
Potassium: 4.1 mmol/L (ref 3.5–5.1)
Sodium: 130 mmol/L — ABNORMAL LOW (ref 135–145)

## 2019-02-18 LAB — CBC
HCT: 39.8 % (ref 39.0–52.0)
Hemoglobin: 13.1 g/dL (ref 13.0–17.0)
MCH: 29 pg (ref 26.0–34.0)
MCHC: 32.9 g/dL (ref 30.0–36.0)
MCV: 88.2 fL (ref 80.0–100.0)
Platelets: 401 10*3/uL — ABNORMAL HIGH (ref 150–400)
RBC: 4.51 MIL/uL (ref 4.22–5.81)
RDW: 13.4 % (ref 11.5–15.5)
WBC: 10.8 10*3/uL — ABNORMAL HIGH (ref 4.0–10.5)
nRBC: 0 % (ref 0.0–0.2)

## 2019-02-18 MED ORDER — ACETAMINOPHEN 325 MG PO TABS
650.0000 mg | ORAL_TABLET | Freq: Once | ORAL | Status: AC
  Administered 2019-02-18: 650 mg via ORAL
  Filled 2019-02-18: qty 2

## 2019-02-18 NOTE — ED Provider Notes (Addendum)
[facility] EMERGENCY DEPARTMENT Provider Note   CSN: [id number] Arrival date & time: 02/18/19  1747     History   Chief Complaint Chief Complaint  Patient presents with  . Weakness    HPI Brad Taylor is a 68 y.o. male.     HPI Patient is a 68 year old male who normally walks with a walker but is had a fall x2 today secondary to weakness.  He feels as though he is slightly more weak on his right side.  He is being treated for chronic wound on his right lower extremity by the [facility].  No fevers or chills.  No new redness.  I spoke with the patient's wife reports this is similar to his prior stroke when he began falling at that time.  Patient denies headache at this time.  He denies weakness of his arms.  No increasing pain in his right leg.   Past Medical History:  Diagnosis Date  . AAA (abdominal aortic aneurysm) ([place]) 5/15   3.5x3.5 cm  . COPD (chronic obstructive pulmonary disease) ([place])   . Depression   . Diabetes mellitus without complication ([place])   . GERD (gastroesophageal reflux disease)   . Hyperlipidemia   . Hypothyroidism   . Post traumatic stress disorder   . PVD (peripheral vascular disease) ([place])   . Stroke ([place])   . Thoracic aortic aneurysm ([place])   . Thyroid disease   . Ulcer     Patient Active Problem List   Diagnosis Date Noted  . Diarrhea 02/07/2019  . Hypokalemia 02/07/2019  . Dehydration 02/06/2019  . Generalized weakness 02/06/2019  . Weakness 02/06/2019  . Hyponatremia 02/06/2019  . Wenckebach second degree AV block   . Thoracic aortic aneurysm ([place])   . PVD (peripheral vascular disease) ([place])   . Jerking 10/01/2018  . History of stroke 10/01/2018  . COPD with acute bronchitis ([place]) 09/12/2018  . H/O: lung cancer 09/12/2018  . Myoclonic jerking 09/12/2018  . Leg wound, right 09/12/2018  . COPD exacerbation ([place]) 09/12/2018  . Tracheal mass   . Osteomyelitis of femur ([place]) 12/03/2017  . Hypothyroidism  12/03/2017  . Chronic ulcer of left leg ([place]) 12/03/2017  . Cellulitis of left leg 12/03/2017  . Chronic diastolic CHF (congestive heart failure) ([place]) 12/03/2017  . Uncontrolled type 2 diabetes mellitus with diabetic neuropathy, with long-term current use of insulin ([place]) 07/25/2015  . Hemiplegia and hemiparesis following unspecified cerebrovascular disease affecting unspecified side ([place]) 07/25/2015  . Cellulitis, gluteal, left 01/19/2014  . Tobacco abuse 01/19/2014  . Aphasia as late effect of stroke 11/09/2013  . CVA (cerebral infarction) 09/20/2013  . Stroke [facility]) 09/15/2013    Past Surgical History:  Procedure Laterality Date  . ABDOMINAL AORTOGRAM W/LOWER EXTREMITY N/A 08/20/2017   Procedure: ABDOMINAL AORTOGRAM W/LOWER EXTREMITY;  Surgeon: Waynetta Sandy, MD;  Location: [facility];  Service: Cardiovascular;  Laterality: N/A;  . ABDOMINAL AORTOGRAM W/LOWER EXTREMITY Bilateral 02/01/2019   Procedure: ABDOMINAL AORTOGRAM W/LOWER EXTREMITY;  Surgeon: Waynetta Sandy, MD;  Location: [facility];  Service: Cardiovascular;  Laterality: Bilateral;  . CATARACT EXTRACTION    . CHOLECYSTECTOMY    . ENDOVENOUS ABLATION SAPHENOUS VEIN W/ LASER Right 03/18/2018   endovenous laser ablation R GSV by Ruta Hinds MD   . ENDOVENOUS ABLATION SAPHENOUS VEIN W/ LASER Right 04/08/2018   endovenous laser ablation R SSV by Ruta Hinds MD   . [place]    . LUNG REMOVAL,  PARTIAL     right, 2 years ago at [facility] for cancer  . PERIPHERAL VASCULAR ATHERECTOMY Right 08/20/2017   Procedure: PERIPHERAL VASCULAR ATHERECTOMY;  Surgeon: Waynetta Sandy, MD;  Location: [facility];  Service: Cardiovascular;  Laterality: Right;  posterior tibial and tibeoperoneal trunk  . PERIPHERAL VASCULAR BALLOON ANGIOPLASTY Right 08/20/2017   Procedure: PERIPHERAL VASCULAR BALLOON ANGIOPLASTY;  Surgeon: Waynetta Sandy, MD;  Location: [facility];  Service:  Cardiovascular;  Laterality: Right;  Anterior tibial  . PERIPHERAL VASCULAR BALLOON ANGIOPLASTY  02/01/2019   Procedure: PERIPHERAL VASCULAR BALLOON ANGIOPLASTY;  Surgeon: Waynetta Sandy, MD;  Location: [facility];  Service: Cardiovascular;;  . PERIPHERAL VASCULAR INTERVENTION  02/01/2019   Procedure: PERIPHERAL VASCULAR INTERVENTION;  Surgeon: Waynetta Sandy, MD;  Location: [facility];  Service: Cardiovascular;;  . VIDEO BRONCHOSCOPY Bilateral 12/30/2017   Procedure: VIDEO BRONCHOSCOPY WITHOUT FLUORO;  Surgeon: Marshell Garfinkel, MD;  Location: [facility];  Service: Cardiopulmonary;  Laterality: Bilateral;        Home Medications    Prior to Admission medications   Medication Sig Start Date End Date Taking? Authorizing Provider  albuterol (PROVENTIL HFA;VENTOLIN HFA) 108 (90 Base) MCG/ACT inhaler INHALE 2 PUFFS INTO THE LUNGS EVERY 6 HOURS AS NEEDED FOR WHEEZE OR SHORTNESS OF BREATH Patient taking differently: Inhale 2 puffs into the lungs every 6 (six) hours as needed for wheezing or shortness of breath.  12/16/17   [name], MD  busPIRone (BUSPAR) 10 MG tablet Take 0.5 tablets (5 mg total) by mouth 2 (two) times daily as needed (for anxiety). Patient taking differently: Take 10 mg by mouth 2 (two) times daily.  10/08/13   Angiulli, Lavon Paganini, PA-C  clopidogrel (PLAVIX) 75 MG tablet Take 1 tablet (75 mg total) by mouth daily with breakfast. 02/01/14   [name], NP  fluticasone (FLONASE) 50 MCG/ACT nasal spray Place 2 sprays into both nostrils daily. Patient taking differently: Place 2 sprays into both nostrils daily as needed for allergies.  12/08/18   [name], MD  Fluticasone-Umeclidin-Vilant (TRELEGY ELLIPTA) 100-62.5-25 MCG/INH AEPB Inhale 1 Inhaler into the lungs daily. 09/24/18   [name], MD  gabapentin (NEURONTIN) 300 MG capsule Take 1 capsule (300 mg total) by mouth 3 (three) times daily. 11/07/17   [name], MD   insulin glargine (LANTUS) 100 UNIT/ML injection Inject 45-55 Units into the skin See admin instructions. Take 55 units in the morning and 45 units at night 10/08/13   Angiulli, Lavon Paganini, PA-C  levothyroxine (SYNTHROID, LEVOTHROID) 125 MCG tablet Take 1 tablet (125 mcg total) by mouth daily before breakfast. 10/08/13   Angiulli, Lavon Paganini, PA-C  meclizine (ANTIVERT) 25 MG tablet Take 1 tablet (25 mg total) by mouth 3 (three) times daily as needed for dizziness (vertigo). 12/08/18   [name], MD  metFORMIN (GLUCOPHAGE) 500 MG tablet Take 500 mg by mouth 2 (two) times daily with a meal.    [provider]  Morphine Sulfate ER 30 MG T12A Take 30 mg by mouth every 12 (twelve) hours as needed for pain.     [provider]  MOVANTIK 25 MG TABS tablet TAKE 1 TABLET BY MOUTH EVERY DAY Patient taking differently: Take 25 mg by mouth daily.  04/23/18   [name], MD  ondansetron (ZOFRAN) 4 MG tablet Take 1 tablet (4 mg total) by mouth every 8 (eight) hours as needed for nausea or vomiting. 02/16/19  [name], MD  RABEprazole (ACIPHEX) 20 MG tablet Take 2 tablets (40 mg total) by mouth daily before breakfast. 10/08/13   Angiulli, Lavon Paganini, PA-C  rosuvastatin (CRESTOR) 10 MG tablet Take 5 mg by mouth daily.    [provider]  sulfamethoxazole-trimethoprim (BACTRIM DS) 800-160 MG tablet Take 1 tablet by mouth 2 (two) times daily. 02/16/19   [name], MD  tamsulosin (FLOMAX) 0.4 MG CAPS capsule Take 0.4 mg by mouth at bedtime.     [provider]  Tiotropium Bromide-Olodaterol (STIOLTO RESPIMAT) 2.5-2.5 MCG/ACT AERS Inhale 2 Inhalers into the lungs daily. 07/09/18   [name], MD  traZODone (DESYREL) 100 MG tablet Take 3 tablets (300 mg total) by mouth at bedtime. 10/08/13   Angiulli, Lavon Paganini, PA-C  triamcinolone cream (KENALOG) 0.1 % Apply 1 application topically 2 (two) times daily as needed (rash).    [provider]  venlafaxine XR  (EFFEXOR-XR) 150 MG 24 hr capsule Take 1 capsule (150 mg total) by mouth daily with breakfast. Patient taking differently: Take 150 mg by mouth 2 (two) times daily.  10/08/13   Angiulli, Lavon Paganini, PA-C    Family History Family History  Problem Relation Age of Onset  . Emphysema Father   . Thyroid disease Sister   . Stroke Maternal Grandmother   . Thyroid disease Sister   . Thyroid disease Sister     Social History Social History   Tobacco Use  . Smoking status: Current Every Day Smoker    Packs/day: 1.00    Years: 30.00    Pack years: 30.00    Types: Cigarettes  . Smokeless tobacco: Former Systems developer    Quit date: 05/04/2013  . Tobacco comment: Less than 1/2 pk per day  Substance Use Topics  . Alcohol use: No    Comment: Former heavy drinker, been sober for 10 years  . Drug use: No     Allergies   Patient has no known allergies.   Review of Systems Review of Systems  All other systems reviewed and are negative.    Physical Exam Updated Vital Signs BP 137/73   Pulse 71   Temp 98.7 F (37.1 C) (Oral)   Resp (!) 27   SpO2 96%   Physical Exam Vitals signs and nursing note reviewed.  Constitutional:      Appearance: He is well-developed.  HENT:     Head: Normocephalic and atraumatic.  Eyes:     Pupils: Pupils are equal, round, and reactive to light.  Neck:     Musculoskeletal: Normal range of motion.  Cardiovascular:     Rate and Rhythm: Normal rate and regular rhythm.     Heart sounds: Normal heart sounds.  Pulmonary:     Effort: Pulmonary effort is normal. No respiratory distress.     Breath sounds: Normal breath sounds.  Abdominal:     General: There is no distension.     Palpations: Abdomen is soft.     Tenderness: There is no abdominal tenderness.  Musculoskeletal: Normal range of motion.     Comments: Chronic wound to the right lower extremity without surrounding erythema or warmth or focal tenderness.  Skin:    General: Skin is warm and dry.   Neurological:     Mental Status: He is alert and oriented to person, place, and time.     Comments: 5/5 strength in major muscle groups of  bilateral upper and lower extremities. Speech normal. No facial asymetry.  Psychiatric:        Judgment: Judgment normal.      ED Treatments / Results  Labs (all labs ordered are listed, but only abnormal results are displayed) Labs Reviewed  CBC - Abnormal; Notable for the following components:      Result Value   WBC 10.8 (*)    Platelets 401 (*)    All other components within normal limits  BASIC METABOLIC PANEL - Abnormal; Notable for the following components:   Sodium 130 (*)    Chloride 92 (*)    Glucose, Bld 148 (*)    Calcium 8.8 (*)    All other components within normal limits    EKG EKG Interpretation  Date/Time:  Thursday February 18 2019 17:52:27 EDT Ventricular Rate:  79 PR Interval:    QRS Duration: 100 QT Interval:  398 QTC Calculation: 457 R Axis:   8 Text Interpretation:  Sinus rhythm Multiple ventricular premature complexes Low voltage, extremity and precordial leads Minimal ST depression, inferior leads Baseline wander in lead(s) I III aVL No significant change was found Confirmed by Jola Schmidt [phone]) on 02/18/2019 11:30:35 PM   Radiology Mr Brain Wo Contrast  Result Date: 02/18/2019 CLINICAL DATA:  Initial evaluation for acute generalized weakness for 2 days, worsened today. EXAM: MRI HEAD WITHOUT CONTRAST TECHNIQUE: Multiplanar, multiecho pulse sequences of the brain and surrounding structures were obtained without intravenous contrast. COMPARISON:  Prior CT from 02/06/2019 FINDINGS: Brain: Examination technically limited due to the patient's inability to tolerate the full length of the exam. Axial and coronal DWI sequences, with axial FLAIR sequence only were performed. Additionally, images provided are degraded by motion artifact. Diffuse prominence of the CSF containing spaces compatible with generalized  age-related cerebral atrophy. Patchy and confluent FLAIR signal abnormality throughout the periventricular and deep white matter both cerebral hemispheres most consistent with chronic microvascular ischemic disease, fairly advanced in nature. Few scattered superimposed remote lacunar infarcts present within the hemispheric cerebral white matter and bilateral basal ganglia. Single approximate 7 mm focus of vague diffusion abnormality within the right centrum semi ovale most likely reflects the sequelae of subacute small vessel ischemia (series 5, image 92). No other abnormal foci of restricted diffusion to suggest acute or subacute ischemia. Gray-white matter differentiation otherwise maintained. No visible mass lesion, mass effect, or midline shift. No hydrocephalus. No extra-axial fluid collection. Vascular: Not well assessed on this technically limited exam, but grossly patent at the skull base. Skull and upper cervical spine: Not well assessed on this limited exam. Sinuses/Orbits: Globes and orbital soft tissues grossly unremarkable. Scattered mucosal thickening throughout the ethmoidal air cells and maxillary sinuses. No obvious mastoid effusion. Other: None. IMPRESSION: 1. Technically limited exam due to the patient's inability to tolerate the full length of the study as well as motion artifact. 2. Single 7 mm focus of vague diffusion abnormality involving the white matter of the right centrum semi ovale, most consistent with subacute small vessel ischemic changes. 3. No other acute intracranial infarct or other definite abnormality identified. Electronically Signed   By: Jeannine Boga M.D.   On: 02/18/2019 22:50    Procedures Procedures (including critical care time)  Medications Ordered in ED Medications - No data to display   Initial Impression / Assessment and Plan / ED Course  I have reviewed the triage vital signs and the nursing notes.  Pertinent labs & imaging results that were  available during my care of the patient were reviewed by me and considered in my  medical decision making (see chart for details).        Possible stroke noted on MRI.  Patient will be admitted to hospital.  Neuro consult.  Patient's wife updated.  12:02 AM Case was discussed with my stroke physician Dr. Cheral Marker who has taken the opportunity to review the MRI personally.  He does not believe that the findings today on MRI represent stroke and does not believe the patient needs any additional work-up in the hospital.  He will be leaving a note regarding this.  He personally reviewed the MRI results.  I spoke with the patient and the patient's wife.  They understand the findings and the recommendations of the neurologist.  Recommended close primary care follow-up.  Patient and family understand to return to the emergency department for new or worsening symptoms.     Final Clinical Impressions(s) / ED Diagnoses   Final diagnoses:  Weakness    ED Discharge Orders    None       Jola Schmidt, MD 02/18/19 2335    Jola Schmidt, MD 02/19/19 0004

## 2019-02-18 NOTE — Progress Notes (Signed)
Images from MRI personally reviewed at the request of Dr. Venora Maples. The faint DWI hyperintensity in the deep white matter of the right hemisphere appears most consistent with T2-shine through artifact from a chronic ischemic deep white matter lesion at the same location which is visible on the FLAIR images. There is also no corresponding hypointensity on the ADC map to conform with imaging guidelines for definition of acute stroke, which requires both hyperintensity on DWI and hypointensity on ADC maps at the same location. Finally, the hyperintensity is on the right, which is ipsilateral to the patient's symptoms and therefore cannot be the etiology for the patient's right sided weakness symptoms.   Electronically signed: Dr. Kerney Elbe

## 2019-02-18 NOTE — ED Triage Notes (Signed)
Pt c/o generalized weakness x 2 days, worsening today. Pt reports he had a fall today, ambulates with a walker. Denies hitting his head, no LOC. A&O x 4. Pt has a wrapped wound to his RLE, states he is seen at [facility] for wound care. EMS VSS. Pt denies pain/shortness of breath.

## 2019-02-18 NOTE — ED Notes (Signed)
Patient transported to MRI 

## 2019-02-18 NOTE — ED Notes (Signed)
Pt stated unable to walk;

## 2019-02-19 DIAGNOSIS — L97812 Non-pressure chronic ulcer of other part of right lower leg with fat layer exposed: Secondary | ICD-10-CM | POA: Diagnosis not present

## 2019-02-19 DIAGNOSIS — B965 Pseudomonas (aeruginosa) (mallei) (pseudomallei) as the cause of diseases classified elsewhere: Secondary | ICD-10-CM | POA: Diagnosis not present

## 2019-02-19 DIAGNOSIS — Z85118 Personal history of other malignant neoplasm of bronchus and lung: Secondary | ICD-10-CM | POA: Diagnosis not present

## 2019-02-19 DIAGNOSIS — F17219 Nicotine dependence, cigarettes, with unspecified nicotine-induced disorders: Secondary | ICD-10-CM | POA: Diagnosis not present

## 2019-02-19 DIAGNOSIS — E11622 Type 2 diabetes mellitus with other skin ulcer: Secondary | ICD-10-CM | POA: Diagnosis not present

## 2019-02-19 DIAGNOSIS — J449 Chronic obstructive pulmonary disease, unspecified: Secondary | ICD-10-CM | POA: Diagnosis not present

## 2019-02-19 DIAGNOSIS — Z9181 History of falling: Secondary | ICD-10-CM | POA: Diagnosis not present

## 2019-02-19 DIAGNOSIS — Z9981 Dependence on supplemental oxygen: Secondary | ICD-10-CM | POA: Diagnosis not present

## 2019-02-19 DIAGNOSIS — Z794 Long term (current) use of insulin: Secondary | ICD-10-CM | POA: Diagnosis not present

## 2019-02-19 NOTE — ED Notes (Signed)
Left a VM @ [phone] (wife) to pick up pt.

## 2019-02-22 DIAGNOSIS — J449 Chronic obstructive pulmonary disease, unspecified: Secondary | ICD-10-CM | POA: Diagnosis not present

## 2019-02-22 DIAGNOSIS — L97812 Non-pressure chronic ulcer of other part of right lower leg with fat layer exposed: Secondary | ICD-10-CM | POA: Diagnosis not present

## 2019-02-22 DIAGNOSIS — Z85118 Personal history of other malignant neoplasm of bronchus and lung: Secondary | ICD-10-CM | POA: Diagnosis not present

## 2019-02-22 DIAGNOSIS — F17219 Nicotine dependence, cigarettes, with unspecified nicotine-induced disorders: Secondary | ICD-10-CM | POA: Diagnosis not present

## 2019-02-22 DIAGNOSIS — Z794 Long term (current) use of insulin: Secondary | ICD-10-CM | POA: Diagnosis not present

## 2019-02-22 DIAGNOSIS — E11622 Type 2 diabetes mellitus with other skin ulcer: Secondary | ICD-10-CM | POA: Diagnosis not present

## 2019-02-22 DIAGNOSIS — Z9981 Dependence on supplemental oxygen: Secondary | ICD-10-CM | POA: Diagnosis not present

## 2019-02-22 DIAGNOSIS — B965 Pseudomonas (aeruginosa) (mallei) (pseudomallei) as the cause of diseases classified elsewhere: Secondary | ICD-10-CM | POA: Diagnosis not present

## 2019-02-22 DIAGNOSIS — Z9181 History of falling: Secondary | ICD-10-CM | POA: Diagnosis not present

## 2019-02-24 DIAGNOSIS — B965 Pseudomonas (aeruginosa) (mallei) (pseudomallei) as the cause of diseases classified elsewhere: Secondary | ICD-10-CM | POA: Diagnosis not present

## 2019-02-24 DIAGNOSIS — Z9981 Dependence on supplemental oxygen: Secondary | ICD-10-CM | POA: Diagnosis not present

## 2019-02-24 DIAGNOSIS — F17219 Nicotine dependence, cigarettes, with unspecified nicotine-induced disorders: Secondary | ICD-10-CM | POA: Diagnosis not present

## 2019-02-24 DIAGNOSIS — Z794 Long term (current) use of insulin: Secondary | ICD-10-CM | POA: Diagnosis not present

## 2019-02-24 DIAGNOSIS — J449 Chronic obstructive pulmonary disease, unspecified: Secondary | ICD-10-CM | POA: Diagnosis not present

## 2019-02-24 DIAGNOSIS — Z85118 Personal history of other malignant neoplasm of bronchus and lung: Secondary | ICD-10-CM | POA: Diagnosis not present

## 2019-02-24 DIAGNOSIS — Z9181 History of falling: Secondary | ICD-10-CM | POA: Diagnosis not present

## 2019-02-24 DIAGNOSIS — L97812 Non-pressure chronic ulcer of other part of right lower leg with fat layer exposed: Secondary | ICD-10-CM | POA: Diagnosis not present

## 2019-02-24 DIAGNOSIS — E11622 Type 2 diabetes mellitus with other skin ulcer: Secondary | ICD-10-CM | POA: Diagnosis not present

## 2019-02-25 DIAGNOSIS — E11622 Type 2 diabetes mellitus with other skin ulcer: Secondary | ICD-10-CM | POA: Diagnosis not present

## 2019-02-25 DIAGNOSIS — I83212 Varicose veins of right lower extremity with both ulcer of calf and inflammation: Secondary | ICD-10-CM | POA: Diagnosis not present

## 2019-02-25 DIAGNOSIS — Z85118 Personal history of other malignant neoplasm of bronchus and lung: Secondary | ICD-10-CM | POA: Diagnosis not present

## 2019-02-25 DIAGNOSIS — F17219 Nicotine dependence, cigarettes, with unspecified nicotine-induced disorders: Secondary | ICD-10-CM | POA: Diagnosis not present

## 2019-02-25 DIAGNOSIS — Z794 Long term (current) use of insulin: Secondary | ICD-10-CM | POA: Diagnosis not present

## 2019-02-25 DIAGNOSIS — L97812 Non-pressure chronic ulcer of other part of right lower leg with fat layer exposed: Secondary | ICD-10-CM | POA: Diagnosis not present

## 2019-02-25 DIAGNOSIS — L97319 Non-pressure chronic ulcer of right ankle with unspecified severity: Secondary | ICD-10-CM | POA: Diagnosis not present

## 2019-02-25 DIAGNOSIS — B965 Pseudomonas (aeruginosa) (mallei) (pseudomallei) as the cause of diseases classified elsewhere: Secondary | ICD-10-CM | POA: Diagnosis not present

## 2019-02-25 DIAGNOSIS — I890 Lymphedema, not elsewhere classified: Secondary | ICD-10-CM | POA: Diagnosis not present

## 2019-02-25 DIAGNOSIS — I70238 Atherosclerosis of native arteries of right leg with ulceration of other part of lower right leg: Secondary | ICD-10-CM | POA: Diagnosis not present

## 2019-02-25 DIAGNOSIS — Z9981 Dependence on supplemental oxygen: Secondary | ICD-10-CM | POA: Diagnosis not present

## 2019-02-25 DIAGNOSIS — Z87891 Personal history of nicotine dependence: Secondary | ICD-10-CM | POA: Diagnosis not present

## 2019-02-25 DIAGNOSIS — L97219 Non-pressure chronic ulcer of right calf with unspecified severity: Secondary | ICD-10-CM | POA: Diagnosis not present

## 2019-02-25 DIAGNOSIS — I83213 Varicose veins of right lower extremity with both ulcer of ankle and inflammation: Secondary | ICD-10-CM | POA: Diagnosis not present

## 2019-02-25 DIAGNOSIS — J449 Chronic obstructive pulmonary disease, unspecified: Secondary | ICD-10-CM | POA: Diagnosis not present

## 2019-02-25 DIAGNOSIS — Z9181 History of falling: Secondary | ICD-10-CM | POA: Diagnosis not present

## 2019-02-25 DIAGNOSIS — L988 Other specified disorders of the skin and subcutaneous tissue: Secondary | ICD-10-CM | POA: Diagnosis not present

## 2019-02-25 DIAGNOSIS — R600 Localized edema: Secondary | ICD-10-CM | POA: Diagnosis not present

## 2019-02-26 DIAGNOSIS — Z9981 Dependence on supplemental oxygen: Secondary | ICD-10-CM | POA: Diagnosis not present

## 2019-02-26 DIAGNOSIS — Z85118 Personal history of other malignant neoplasm of bronchus and lung: Secondary | ICD-10-CM | POA: Diagnosis not present

## 2019-02-26 DIAGNOSIS — F17219 Nicotine dependence, cigarettes, with unspecified nicotine-induced disorders: Secondary | ICD-10-CM | POA: Diagnosis not present

## 2019-02-26 DIAGNOSIS — J449 Chronic obstructive pulmonary disease, unspecified: Secondary | ICD-10-CM | POA: Diagnosis not present

## 2019-02-26 DIAGNOSIS — E11622 Type 2 diabetes mellitus with other skin ulcer: Secondary | ICD-10-CM | POA: Diagnosis not present

## 2019-02-26 DIAGNOSIS — L97812 Non-pressure chronic ulcer of other part of right lower leg with fat layer exposed: Secondary | ICD-10-CM | POA: Diagnosis not present

## 2019-02-26 DIAGNOSIS — Z9181 History of falling: Secondary | ICD-10-CM | POA: Diagnosis not present

## 2019-02-26 DIAGNOSIS — B965 Pseudomonas (aeruginosa) (mallei) (pseudomallei) as the cause of diseases classified elsewhere: Secondary | ICD-10-CM | POA: Diagnosis not present

## 2019-02-26 DIAGNOSIS — Z794 Long term (current) use of insulin: Secondary | ICD-10-CM | POA: Diagnosis not present

## 2019-02-26 NOTE — Progress Notes (Signed)
Pt not seen by a provider this day, he rescheduled

## 2019-03-01 DIAGNOSIS — Z794 Long term (current) use of insulin: Secondary | ICD-10-CM | POA: Diagnosis not present

## 2019-03-01 DIAGNOSIS — L97812 Non-pressure chronic ulcer of other part of right lower leg with fat layer exposed: Secondary | ICD-10-CM | POA: Diagnosis not present

## 2019-03-01 DIAGNOSIS — T07XXXA Unspecified multiple injuries, initial encounter: Secondary | ICD-10-CM | POA: Diagnosis not present

## 2019-03-01 DIAGNOSIS — B965 Pseudomonas (aeruginosa) (mallei) (pseudomallei) as the cause of diseases classified elsewhere: Secondary | ICD-10-CM | POA: Diagnosis not present

## 2019-03-01 DIAGNOSIS — Z85118 Personal history of other malignant neoplasm of bronchus and lung: Secondary | ICD-10-CM | POA: Diagnosis not present

## 2019-03-01 DIAGNOSIS — F17219 Nicotine dependence, cigarettes, with unspecified nicotine-induced disorders: Secondary | ICD-10-CM | POA: Diagnosis not present

## 2019-03-01 DIAGNOSIS — R404 Transient alteration of awareness: Secondary | ICD-10-CM | POA: Diagnosis not present

## 2019-03-01 DIAGNOSIS — J449 Chronic obstructive pulmonary disease, unspecified: Secondary | ICD-10-CM | POA: Diagnosis not present

## 2019-03-01 DIAGNOSIS — Z9981 Dependence on supplemental oxygen: Secondary | ICD-10-CM | POA: Diagnosis not present

## 2019-03-01 DIAGNOSIS — Z9181 History of falling: Secondary | ICD-10-CM | POA: Diagnosis not present

## 2019-03-01 DIAGNOSIS — E11622 Type 2 diabetes mellitus with other skin ulcer: Secondary | ICD-10-CM | POA: Diagnosis not present

## 2019-03-01 DIAGNOSIS — R4020 Unspecified coma: Secondary | ICD-10-CM | POA: Diagnosis not present

## 2019-03-06 DIAGNOSIS — 419620001 Death: Secondary | SNOMED CT | POA: Diagnosis not present

## 2019-03-06 DEATH — deceased

## 2019-03-12 ENCOUNTER — Encounter (HOSPITAL_COMMUNITY): Payer: Medicare HMO

## 2019-03-12 ENCOUNTER — Ambulatory Visit (HOSPITAL_COMMUNITY): Payer: No Typology Code available for payment source

## 2019-04-01 ENCOUNTER — Ambulatory Visit: Payer: Self-pay | Admitting: Adult Health
